# Patient Record
Sex: Female | Born: 1935 | Race: White | Hispanic: No | Smoking: Former smoker
Health system: Southern US, Community
[De-identification: ages and names within clinical notes are randomized; demographics above are authoritative.]

## PROBLEM LIST (undated history)

## (undated) DIAGNOSIS — D500 Iron deficiency anemia secondary to blood loss (chronic): Secondary | ICD-10-CM

## (undated) DIAGNOSIS — G459 Transient cerebral ischemic attack, unspecified: Secondary | ICD-10-CM

## (undated) DIAGNOSIS — Z9289 Personal history of other medical treatment: Secondary | ICD-10-CM

## (undated) DIAGNOSIS — I340 Nonrheumatic mitral (valve) insufficiency: Secondary | ICD-10-CM

## (undated) DIAGNOSIS — R001 Bradycardia, unspecified: Secondary | ICD-10-CM

## (undated) DIAGNOSIS — I2510 Atherosclerotic heart disease of native coronary artery without angina pectoris: Secondary | ICD-10-CM

## (undated) DIAGNOSIS — K219 Gastro-esophageal reflux disease without esophagitis: Secondary | ICD-10-CM

## (undated) DIAGNOSIS — C8590 Non-Hodgkin lymphoma, unspecified, unspecified site: Secondary | ICD-10-CM

## (undated) DIAGNOSIS — I5032 Chronic diastolic (congestive) heart failure: Secondary | ICD-10-CM

## (undated) DIAGNOSIS — I219 Acute myocardial infarction, unspecified: Secondary | ICD-10-CM

## (undated) HISTORY — DX: Acute myocardial infarction, unspecified: I21.9

## (undated) HISTORY — DX: Transient cerebral ischemic attack, unspecified: G45.9

## (undated) HISTORY — DX: Nonrheumatic mitral (valve) insufficiency: I34.0

## (undated) HISTORY — DX: Atherosclerotic heart disease of native coronary artery without angina pectoris: I25.10

## (undated) HISTORY — PX: EYE SURGERY: SHX253

## (undated) HISTORY — DX: Iron deficiency anemia secondary to blood loss (chronic): D50.0

## (undated) HISTORY — DX: Chronic diastolic (congestive) heart failure: I50.32

## (undated) HISTORY — DX: Bradycardia, unspecified: R00.1

## (undated) HISTORY — PX: OTHER SURGICAL HISTORY: SHX169

## (undated) HISTORY — DX: Non-Hodgkin lymphoma, unspecified, unspecified site: C85.90

---

## 1971-04-26 HISTORY — PX: CHOLECYSTECTOMY: SHX55

## 2007-04-26 DIAGNOSIS — I219 Acute myocardial infarction, unspecified: Secondary | ICD-10-CM

## 2007-04-26 DIAGNOSIS — Z9289 Personal history of other medical treatment: Secondary | ICD-10-CM

## 2007-04-26 HISTORY — DX: Personal history of other medical treatment: Z92.89

## 2007-04-26 HISTORY — DX: Acute myocardial infarction, unspecified: I21.9

## 2007-04-26 HISTORY — PX: CORONARY ANGIOPLASTY WITH STENT PLACEMENT: SHX49

## 2007-04-26 HISTORY — PX: CARDIAC SURGERY: SHX584

## 2007-11-24 HISTORY — PX: CORONARY ARTERY BYPASS GRAFT: SHX141

## 2011-04-26 HISTORY — PX: ESOPHAGOGASTRODUODENOSCOPY: SHX1529

## 2012-04-25 HISTORY — PX: COLON SURGERY: SHX602

## 2013-07-22 ENCOUNTER — Encounter (INDEPENDENT_AMBULATORY_CARE_PROVIDER_SITE_OTHER): Payer: Self-pay

## 2013-07-24 ENCOUNTER — Encounter (INDEPENDENT_AMBULATORY_CARE_PROVIDER_SITE_OTHER): Payer: Self-pay | Admitting: General Surgery

## 2013-07-24 ENCOUNTER — Ambulatory Visit (INDEPENDENT_AMBULATORY_CARE_PROVIDER_SITE_OTHER): Payer: Medicare Other | Admitting: General Surgery

## 2013-07-24 ENCOUNTER — Other Ambulatory Visit (INDEPENDENT_AMBULATORY_CARE_PROVIDER_SITE_OTHER): Payer: Self-pay | Admitting: General Surgery

## 2013-07-24 VITALS — BP 128/78 | HR 74 | Temp 98.0°F | Resp 16 | Ht 61.0 in | Wt 180.4 lb

## 2013-07-24 DIAGNOSIS — K9403 Colostomy malfunction: Secondary | ICD-10-CM | POA: Insufficient documentation

## 2013-07-24 DIAGNOSIS — K9413 Enterostomy malfunction: Secondary | ICD-10-CM

## 2013-07-24 DIAGNOSIS — K5900 Constipation, unspecified: Secondary | ICD-10-CM

## 2013-07-24 NOTE — Progress Notes (Signed)
Chief Complaint  Patient presents with  . New Evaluation    eval for ostomy revision    HISTORY:  Makayla Fisher is a 80 y.o. female who presents to the office with colostomy dysfunction.  She has a history of a sigmoid stricture which calls colonic obstruction in the fall of last year. She underwent urgent surgery on 02/26/2013 for concern of ischemic or perforated colon. 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 She underwent an open right colectomy and sigmoid colectomy with ileotransverse anastomosis and end descending colostomy with Hartmann's pouch.  She had what sounds like a very long protracted post operative course requiring mechanical ventilation.  It also appears that she went into acute renal failure. She was discharged to rehabilitation to return to the hospital a day later for what sounds like either a postoperative small bowel obstruction or possibly a contained leak for which she was given antibiotics.  She has slowly recovered.  She is now moved to [place] and is at an assisted living facility in town. Over the past few months he has had difficulty with ostomy pouching. She has had trouble with skin breakdown and stool leakage.    Past Medical History  Diagnosis Date  . Heart attack        Past Surgical History  Procedure Laterality Date  . Colon surgery  2014    Colostomy  . Cardiac surgery  2009  . Coronary angioplasty with stent placement  2009  . C section  1947, 1963  . Cholecystectomy  1973  . Esophagogastroduodenoscopy  2013      Current Outpatient Prescriptions  Medication Sig Dispense Refill  . acetaminophen (TYLENOL) 325 MG tablet Take 325 mg by mouth. One tablet as needed      . ascorbic acid (VITAMIN C) 1000 MG tablet Take 1,000 mg by mouth daily.      Marland Kitchen aspirin 81 MG tablet Take 81 mg by mouth daily.      . calcium carbonate (TUMS - DOSED IN MG ELEMENTAL CALCIUM) 500 MG chewable tablet Chew 1 tablet by  mouth daily.      . carboxymethylcellulose (REFRESH PLUS) 0.5 % SOLN 1 drop 2 (two) times daily as needed.      . chlorhexidine (PERIDEX) 0.12 % solution Use as directed 15 mLs in the mouth or throat 2 (two) times daily.      . cholecalciferol (VITAMIN D) 1000 UNITS tablet Take 1,000 Units by mouth daily.      . CloNIDine HCl (CATAPRES PO) Take by mouth at bedtime as needed (1 tablet).      . cycloSPORINE (RESTASIS) 0.05 % ophthalmic emulsion 1 drop 2 (two) times daily.      . fondaparinux (ARIXTRA) 2.5 MG/0.5ML SOLN injection Inject 2.5 mg into the skin daily.      . furosemide (LASIX) 20 MG tablet Take 20 mg by mouth.      . Levothyroxine Sodium (SYNTHROID PO) Take by mouth daily.      Marland Kitchen LISINOPRIL PO Take 5 mg by mouth.      . magnesium hydroxide (MILK OF MAGNESIA) 400 MG/5ML suspension Take by mouth daily as needed for mild constipation.      . MORPHINE SULFATE PO Take 2 mg by mouth.      . Omega-3 Fatty Acids (FISH OIL) 1000 MG CAPS Take 1,000 mg by mouth daily.      . ondansetron (ZOFRAN) 8 MG tablet Take by mouth every 8 (eight) hours  as needed for nausea or vomiting.      . pravastatin (PRAVACHOL) 40 MG tablet Take 40 mg by mouth at bedtime.      Marland Kitchen rOPINIRole (REQUIP) 1 MG tablet Take 1 mg by mouth at bedtime.      . sertraline (ZOLOFT) 25 MG tablet Take 25 mg by mouth daily.      . Simvastatin (ZOCOR PO) Take by mouth.      . Thiamine Mononitrate (VITAMIN B1 PO) Take by mouth. Take 1 with meal daily      . timolol (TIMOPTIC) 0.5 % ophthalmic solution 1 drop 2 (two) times daily. In affected eye      . Tiotropium Bromide Monohydrate (SPIRIVA RESPIMAT) 2.5 MCG/ACT AERS Inhale 1 puff into the lungs daily.      . Bisacodyl (DULCOLAX PO) Take by mouth as needed.      . ezetimibe (ZETIA) 10 MG tablet Take 10 mg by mouth at bedtime.      . levalbuterol (XOPENEX) 1.25 MG/3ML nebulizer solution Take 1.25 mg by nebulization every 6 (six) hours as needed for wheezing.      Marland Kitchen LORazepam (ATIVAN) 1  MG tablet Take 1 mg by mouth as needed for anxiety.      . Multiple Vitamins-Minerals (CENTRUM SILVER) CHEW Chew by mouth daily.      . pantoprazole (PROTONIX) 40 MG tablet Take 40 mg by mouth daily.       No current facility-administered medications for this visit.     Allergies  Allergen Reactions  . Epipen [Epinephrine Hcl]     Heart palpitations  . Heparin     Unsure of side affects  . Levaquin [Levofloxacin In D5w]     Weakness  . Sulfa Antibiotics Rash      Family History  Problem Relation Age of Onset  . Diverticulitis Mother   . Heart attack Mother   . Ulcers Father       History   Social History  . Marital Status: Married    Spouse Name: N/A    Number of Children: N/A  . Years of Education: N/A   Social History Main Topics  . Smoking status: Former Research scientist (life sciences)  . Smokeless tobacco: None  . Alcohol Use: No  . Drug Use: No  . Sexual Activity: None   Other Topics Concern  . None   Social History Narrative  . None       REVIEW OF SYSTEMS - PERTINENT POSITIVES ONLY: Review of Systems - General ROS: negative for - chills or fever Respiratory ROS: no cough, shortness of breath, or wheezing Cardiovascular ROS: no chest pain or dyspnea on exertion Gastrointestinal ROS: no abdominal pain, change in bowel habits, or black or bloody stools Genito-Urinary ROS: no dysuria, trouble voiding, or hematuria  EXAM: Filed Vitals:   07/24/13 1335  BP: 128/78  Pulse: 74  Temp: 98 F (36.7 C)  Resp: 16    General appearance: alert and cooperative Resp: clear to auscultation bilaterally Cardio: regular rate and rhythm GI: normal findings: soft, non-tender ostomy retracted with stenotic skin opening approximately 1 x 2 cm Mild skin breakdown at edges of the ostomy.   ASSESSMENT AND PLAN: Makayla Fisher Is an 80 year old female who underwent a large colonic resection after a contained perforation due to diverticular stricture. She is slowly recovering. She does  have a retracted ostomy with some stenosis present at the skin level. There is also some irritated skin around her ostomy site. I have recommended  that she start some MiraLax daily to help with her bowel movements and prevent constipation. I would also like to get a CT scan to evaluate the length of colon present in her abdomen. I will then see her back in the office in approximately 8 weeks for reevaluation. I anticipate that she will need some sort of procedure operatively to help with her stenosis and a severe traction. I think the longer that we wait the better chance  we have of doing something locally and not a large abdominal procedure.  She will continue to work with the ostomy nurse to find a pouching system that works best for her.     Rosario Adie, MD Colon and Rectal Surgery / [facility]      Visit Diagnoses: 1. Colostomy stenosis   2. Unspecified constipation     Primary Care Physician: No primary provider on file.

## 2013-07-24 NOTE — Patient Instructions (Signed)
Add 1 dose of MiraLax to your daily medications.  Continue to work with the ostomy RN to find the right wafer to decrease skin breakdown.  Return to the office in 8 wks.

## 2013-08-12 ENCOUNTER — Encounter (INDEPENDENT_AMBULATORY_CARE_PROVIDER_SITE_OTHER): Payer: Self-pay

## 2013-08-14 ENCOUNTER — Emergency Department (HOSPITAL_COMMUNITY): Payer: Medicare Other

## 2013-08-14 ENCOUNTER — Encounter (HOSPITAL_COMMUNITY): Payer: Self-pay | Admitting: Emergency Medicine

## 2013-08-14 ENCOUNTER — Emergency Department (HOSPITAL_COMMUNITY)
Admission: EM | Admit: 2013-08-14 | Discharge: 2013-08-14 | Disposition: A | Discharge status: Home / Self Care | Payer: Medicare Other | Attending: Emergency Medicine | Admitting: Emergency Medicine

## 2013-08-14 DIAGNOSIS — Z9861 Coronary angioplasty status: Secondary | ICD-10-CM | POA: Insufficient documentation

## 2013-08-14 DIAGNOSIS — S0003XA Contusion of scalp, initial encounter: Secondary | ICD-10-CM | POA: Insufficient documentation

## 2013-08-14 DIAGNOSIS — Z9889 Other specified postprocedural states: Secondary | ICD-10-CM | POA: Insufficient documentation

## 2013-08-14 DIAGNOSIS — S1093XA Contusion of unspecified part of neck, initial encounter: Principal | ICD-10-CM

## 2013-08-14 DIAGNOSIS — S8000XA Contusion of unspecified knee, initial encounter: Secondary | ICD-10-CM | POA: Insufficient documentation

## 2013-08-14 DIAGNOSIS — Y9389 Activity, other specified: Secondary | ICD-10-CM | POA: Insufficient documentation

## 2013-08-14 DIAGNOSIS — Z79899 Other long term (current) drug therapy: Secondary | ICD-10-CM | POA: Insufficient documentation

## 2013-08-14 DIAGNOSIS — Z8679 Personal history of other diseases of the circulatory system: Secondary | ICD-10-CM | POA: Insufficient documentation

## 2013-08-14 DIAGNOSIS — Z7982 Long term (current) use of aspirin: Secondary | ICD-10-CM | POA: Insufficient documentation

## 2013-08-14 DIAGNOSIS — S0083XA Contusion of other part of head, initial encounter: Secondary | ICD-10-CM | POA: Insufficient documentation

## 2013-08-14 DIAGNOSIS — R296 Repeated falls: Secondary | ICD-10-CM | POA: Insufficient documentation

## 2013-08-14 DIAGNOSIS — S40029A Contusion of unspecified upper arm, initial encounter: Secondary | ICD-10-CM | POA: Insufficient documentation

## 2013-08-14 DIAGNOSIS — S40022A Contusion of left upper arm, initial encounter: Secondary | ICD-10-CM

## 2013-08-14 DIAGNOSIS — Y929 Unspecified place or not applicable: Secondary | ICD-10-CM | POA: Insufficient documentation

## 2013-08-14 DIAGNOSIS — IMO0002 Reserved for concepts with insufficient information to code with codable children: Secondary | ICD-10-CM | POA: Insufficient documentation

## 2013-08-14 DIAGNOSIS — Z87891 Personal history of nicotine dependence: Secondary | ICD-10-CM | POA: Insufficient documentation

## 2013-08-14 LAB — ACETAMINOPHEN LEVEL: Acetaminophen (Tylenol), Serum: <15 ug/mL (ref 10–30)

## 2013-08-14 MED ORDER — ACETAMINOPHEN 325 MG PO TABS
650.0000 mg | ORAL_TABLET | Freq: Once | ORAL | Status: AC
  Administered 2013-08-14: 650 mg via ORAL
  Filled 2013-08-14: qty 2

## 2013-08-14 MED ORDER — TRAMADOL HCL 50 MG PO TABS
50.0000 mg | ORAL_TABLET | Freq: Four times a day (QID) | ORAL | Status: DC | PRN
Start: 2013-08-14 — End: 2013-10-23

## 2013-08-14 MED ORDER — OXYCODONE-ACETAMINOPHEN 5-325 MG PO TABS
2.0000 | ORAL_TABLET | Freq: Once | ORAL | Status: DC
Start: 2013-08-14 — End: 2013-08-15
  Filled 2013-08-14: qty 2

## 2013-08-14 NOTE — ED Notes (Addendum)
Pt has hematoma on L side of forehead, abrasions on L knee, and L arm/shoulder pain upon ROM. Pt denies LOC when falling.

## 2013-08-14 NOTE — ED Notes (Signed)
Per EMS, Pt c/o L shoulder pain after fall. Pt coming from [facility]. Pt aws trying to scoot sideways caught sneaker on rug and fell on L side. Pt c/o L shoulder pain only on ROM. Pt also has hematoma on L side of head. Denies pain in head. Pt A&Ox4.

## 2013-08-14 NOTE — ED Notes (Signed)
Bed: QM25 Expected date: 08/14/13 Expected time: 7:30 PM Means of arrival: Ambulance Comments: Fall, shoulder pain

## 2013-08-14 NOTE — ED Notes (Signed)
Ortho Tech removed rings on L hand to put on sling, jewelry given to niece.

## 2013-08-14 NOTE — Discharge Instructions (Signed)

## 2013-08-19 ENCOUNTER — Encounter (HOSPITAL_COMMUNITY): Payer: Self-pay | Admitting: Emergency Medicine

## 2013-08-19 ENCOUNTER — Emergency Department (HOSPITAL_COMMUNITY)
Admission: EM | Admit: 2013-08-19 | Discharge: 2013-08-19 | Disposition: A | Discharge status: Skilled Nursing Facility | Payer: Medicare Other | Attending: Emergency Medicine | Admitting: Emergency Medicine

## 2013-08-19 ENCOUNTER — Emergency Department (HOSPITAL_COMMUNITY): Payer: Medicare Other

## 2013-08-19 DIAGNOSIS — J90 Pleural effusion, not elsewhere classified: Secondary | ICD-10-CM | POA: Insufficient documentation

## 2013-08-19 DIAGNOSIS — R109 Unspecified abdominal pain: Secondary | ICD-10-CM

## 2013-08-19 DIAGNOSIS — Z87891 Personal history of nicotine dependence: Secondary | ICD-10-CM | POA: Insufficient documentation

## 2013-08-19 DIAGNOSIS — Z888 Allergy status to other drugs, medicaments and biological substances status: Secondary | ICD-10-CM | POA: Insufficient documentation

## 2013-08-19 DIAGNOSIS — S8000XA Contusion of unspecified knee, initial encounter: Secondary | ICD-10-CM | POA: Insufficient documentation

## 2013-08-19 DIAGNOSIS — S0003XA Contusion of scalp, initial encounter: Secondary | ICD-10-CM | POA: Insufficient documentation

## 2013-08-19 DIAGNOSIS — S1093XA Contusion of unspecified part of neck, initial encounter: Secondary | ICD-10-CM

## 2013-08-19 DIAGNOSIS — Z79899 Other long term (current) drug therapy: Secondary | ICD-10-CM | POA: Insufficient documentation

## 2013-08-19 DIAGNOSIS — I252 Old myocardial infarction: Secondary | ICD-10-CM | POA: Insufficient documentation

## 2013-08-19 DIAGNOSIS — M549 Dorsalgia, unspecified: Secondary | ICD-10-CM | POA: Insufficient documentation

## 2013-08-19 DIAGNOSIS — Z882 Allergy status to sulfonamides status: Secondary | ICD-10-CM | POA: Insufficient documentation

## 2013-08-19 DIAGNOSIS — S0083XA Contusion of other part of head, initial encounter: Secondary | ICD-10-CM | POA: Insufficient documentation

## 2013-08-19 DIAGNOSIS — T07XXXA Unspecified multiple injuries, initial encounter: Secondary | ICD-10-CM

## 2013-08-19 DIAGNOSIS — R911 Solitary pulmonary nodule: Secondary | ICD-10-CM | POA: Insufficient documentation

## 2013-08-19 DIAGNOSIS — Y929 Unspecified place or not applicable: Secondary | ICD-10-CM | POA: Insufficient documentation

## 2013-08-19 DIAGNOSIS — W19XXXA Unspecified fall, initial encounter: Secondary | ICD-10-CM | POA: Insufficient documentation

## 2013-08-19 DIAGNOSIS — Y939 Activity, unspecified: Secondary | ICD-10-CM | POA: Insufficient documentation

## 2013-08-19 DIAGNOSIS — Z7982 Long term (current) use of aspirin: Secondary | ICD-10-CM | POA: Insufficient documentation

## 2013-08-19 DIAGNOSIS — M25562 Pain in left knee: Secondary | ICD-10-CM

## 2013-08-19 DIAGNOSIS — S301XXA Contusion of abdominal wall, initial encounter: Secondary | ICD-10-CM | POA: Insufficient documentation

## 2013-08-19 DIAGNOSIS — Z91041 Radiographic dye allergy status: Secondary | ICD-10-CM | POA: Insufficient documentation

## 2013-08-19 LAB — COMPREHENSIVE METABOLIC PANEL
ALBUMIN: 3.1 g/dL — AB (ref 3.5–5.2)
ALT: 17 U/L (ref 0–35)
AST: 56 U/L — ABNORMAL HIGH (ref 0–37)
Alkaline Phosphatase: 146 U/L — ABNORMAL HIGH (ref 39–117)
BUN: 6 mg/dL (ref 6–23)
CO2: 26 mEq/L (ref 19–32)
Calcium: 9 mg/dL (ref 8.4–10.5)
Chloride: 98 mEq/L (ref 96–112)
Creatinine, Ser: 0.46 mg/dL — ABNORMAL LOW (ref 0.50–1.10)
GFR calc Af Amer: >90 mL/min (ref >90)
GFR calc non Af Amer: 86 mL/min — ABNORMAL LOW (ref >90)
Glucose, Bld: 88 mg/dL (ref 70–99)
Potassium: 3.7 mEq/L (ref 3.7–5.3)
SODIUM: 137 meq/L (ref 137–147)
TOTAL PROTEIN: 6.6 g/dL (ref 6.0–8.3)
Total Bilirubin: 0.7 mg/dL (ref 0.3–1.2)

## 2013-08-19 LAB — URINALYSIS, ROUTINE W REFLEX MICROSCOPIC
Bilirubin Urine: NEGATIVE
Glucose, UA: NEGATIVE mg/dL
Hgb urine dipstick: NEGATIVE
Ketones, ur: NEGATIVE mg/dL
Nitrite: NEGATIVE
PH: 7 (ref 5.0–8.0)
Protein, ur: NEGATIVE mg/dL
Specific Gravity, Urine: 1.007 (ref 1.005–1.030)
Urobilinogen, UA: 0.2 mg/dL (ref 0.0–1.0)

## 2013-08-19 LAB — CBC WITH DIFFERENTIAL/PLATELET
BASOS ABS: 0 10*3/uL (ref 0.0–0.1)
BASOS PCT: 0 % (ref 0–1)
Eosinophils Absolute: 0.2 10*3/uL (ref 0.0–0.7)
Eosinophils Relative: 3 % (ref 0–5)
HCT: 33 % — ABNORMAL LOW (ref 36.0–46.0)
Hemoglobin: 11 g/dL — ABNORMAL LOW (ref 12.0–15.0)
LYMPHS PCT: 28 % (ref 12–46)
Lymphs Abs: 2 10*3/uL (ref 0.7–4.0)
MCH: 28 pg (ref 26.0–34.0)
MCHC: 33.3 g/dL (ref 30.0–36.0)
MCV: 84 fL (ref 78.0–100.0)
Monocytes Absolute: 0.8 10*3/uL (ref 0.1–1.0)
Monocytes Relative: 10 % (ref 3–12)
Neutro Abs: 4.4 10*3/uL (ref 1.7–7.7)
Neutrophils Relative %: 59 % (ref 43–77)
PLATELETS: 221 10*3/uL (ref 150–400)
RBC: 3.93 MIL/uL (ref 3.87–5.11)
RDW: 17.8 % — AB (ref 11.5–15.5)
WBC: 7.4 10*3/uL (ref 4.0–10.5)

## 2013-08-19 LAB — URINE MICROSCOPIC-ADD ON

## 2013-08-19 LAB — I-STAT TROPONIN, ED: TROPONIN I, POC: 0.03 ng/mL (ref 0.00–0.08)

## 2013-08-19 LAB — LIPASE, BLOOD: Lipase: 18 U/L (ref 11–59)

## 2013-08-19 MED ORDER — IOHEXOL 300 MG/ML  SOLN
100.0000 mL | Freq: Once | INTRAMUSCULAR | Status: AC | PRN
  Administered 2013-08-19: 100 mL via INTRAVENOUS

## 2013-08-19 MED ORDER — SODIUM CHLORIDE 0.9 % IV BOLUS (SEPSIS)
1000.0000 mL | Freq: Once | INTRAVENOUS | Status: AC
  Administered 2013-08-19: 1000 mL via INTRAVENOUS

## 2013-08-19 MED ORDER — ACETAMINOPHEN 500 MG PO TABS: 1000.0000 mg | ORAL_TABLET | Freq: Four times a day (QID) | ORAL | Status: DC | PRN

## 2013-08-19 MED ORDER — ACETAMINOPHEN 500 MG PO TABS
1000.0000 mg | ORAL_TABLET | Freq: Once | ORAL | Status: AC
  Administered 2013-08-19: 1000 mg via ORAL
  Filled 2013-08-19: qty 2

## 2013-08-19 MED ORDER — DOCUSATE SODIUM 100 MG PO CAPS: 100.0000 mg | ORAL_CAPSULE | Freq: Two times a day (BID) | ORAL | Status: DC

## 2013-08-19 MED ORDER — POLYETHYLENE GLYCOL 3350 17 G PO PACK: 17.0000 g | PACK | Freq: Every day | ORAL | Status: DC

## 2013-08-19 NOTE — ED Notes (Signed)
Patient transported to CT 

## 2013-08-19 NOTE — ED Notes (Signed)
[facility] called for patient transport 

## 2013-08-19 NOTE — ED Provider Notes (Signed)
TIME SEEN: 3:17 PM  CHIEF COMPLAINT: Abdominal pain  HPI: Patient is a 80 year old female who lives at [facility] who had a fall on 4/22 who is seen in the emergency department had a negative x-ray of her left shoulder who presents today with pain diffusely around her abdomen and into her back this been present since her fall. She states it hurts to breathe but she does not feel short of breath. She denies any chest pain. She didn't hit her head but did not have any imaging of her head. She is on anticoagulation. She also has bruising to her left knee and is requesting an x-ray today. No fevers or cough. No hypoxia or respiratory distress.  ROS: See HPI Constitutional: no fever  Eyes: no drainage  ENT: no runny nose   Cardiovascular:  no chest pain  Resp: no SOB  GI: no vomiting GU: no dysuria Integumentary: no rash  Allergy: no hives  Musculoskeletal: no leg swelling  Neurological: no slurred speech ROS otherwise negative  PAST MEDICAL HISTORY/PAST SURGICAL HISTORY:  Past Medical History  Diagnosis Date  . Heart attack     MEDICATIONS:  Prior to Admission medications   Medication Sig Start Date End Date Taking? Authorizing Provider  amLODipine (NORVASC) 5 MG tablet Take 5 mg by mouth daily.   Yes Historical Provider, MD  aspirin 81 MG tablet Take 81 mg by mouth every other day.    Yes Historical Provider, MD  carvedilol (COREG) 3.125 MG tablet Take 3.125 mg by mouth 2 (two) times daily with a meal.   Yes Historical Provider, MD  cycloSPORINE (RESTASIS) 0.05 % ophthalmic emulsion 1 drop 2 (two) times daily.   Yes Historical Provider, MD  ezetimibe (ZETIA) 10 MG tablet Take 10 mg by mouth daily.    Yes Historical Provider, MD  furosemide (LASIX) 20 MG tablet Take 20 mg by mouth.   Yes Historical Provider, MD  levothyroxine (SYNTHROID, LEVOTHROID) 88 MCG tablet Take 88 mcg by mouth daily before breakfast.   Yes Historical Provider, MD  lisinopril (PRINIVIL,ZESTRIL) 20 MG tablet  Take 20 mg by mouth 2 (two) times daily.   Yes Historical Provider, MD  Multiple Vitamins-Minerals (CENTRUM SILVER) CHEW Chew by mouth daily.   Yes Historical Provider, MD  pantoprazole (PROTONIX) 40 MG tablet Take 40 mg by mouth daily.   Yes Historical Provider, MD  rOPINIRole (REQUIP) 1 MG tablet Take 1 mg by mouth at bedtime.   Yes Historical Provider, MD  sertraline (ZOLOFT) 25 MG tablet Take 25 mg by mouth daily.   Yes Historical Provider, MD  simvastatin (ZOCOR) 40 MG tablet Take 40 mg by mouth daily.   Yes Historical Provider, MD  Tiotropium Bromide Monohydrate (SPIRIVA RESPIMAT) 2.5 MCG/ACT AERS Inhale 1 puff into the lungs daily.   Yes Historical Provider, MD  traMADol (ULTRAM) 50 MG tablet Take 1 tablet (50 mg total) by mouth every 6 (six) hours as needed. 08/14/13  Yes Virgel Manifold, MD    ALLERGIES:  Allergies  Allergen Reactions  . Epipen [Epinephrine Hcl]     Heart palpitations  . Heparin     Unsure of side affects  . Levaquin [Levofloxacin In D5w]     Weakness  . Sulfa Antibiotics Rash    SOCIAL HISTORY:  History  Substance Use Topics  . Smoking status: Former Research scientist (life sciences)  . Smokeless tobacco: Not on file  . Alcohol Use: No    FAMILY HISTORY: Family History  Problem Relation Age of Onset  .  Diverticulitis Mother   . Heart attack Mother   . Ulcers Father     EXAM: BP 144/49  Pulse 65  Temp(Src) 98 F (36.7 C) (Oral)  Resp 16  SpO2 95% CONSTITUTIONAL: Alert and oriented and responds appropriately to questions. Well-appearing; well-nourished; GCS 15 HEAD: Normocephalic; areas of ecchymosis to the face that are healing EYES: Conjunctivae clear, PERRL, EOMI ENT: normal nose; no rhinorrhea; moist mucous membranes; pharynx without lesions noted; no dental injury; ; no septal hematoma NECK: Supple, no meningismus, no LAD; no midline spinal tenderness, step-off or deformity CARD: RRR; S1 and S2 appreciated; no murmurs, no clicks, no rubs, no gallops RESP: Normal  chest excursion without splinting or tachypnea; breath sounds clear and equal bilaterally; no wheezes, no rhonchi, no rales; chest wall stable, no crepitus or ecchymosis the patient does have tenderness over her lower ribs into her back ABD/GI: Normal bowel sounds; non-distended; soft, no rebound, no guarding, mildly tender to palpation diffusely across her lower ribs and into her back without peritoneal signs PELVIS:  stable, nontender to palpation BACK:  The back appears normal and is non-tender to palpation, there is no CVA tenderness; no midline spinal tenderness, step-off or deformity EXT: Patient has ecchymosis over her left anterior knee with small effusion, no signs of septic arthritis, no ligamentous laxity, 2+ DP pulses bilaterally, otherwise Normal ROM in all joints; non-tender to palpation; no edema; normal capillary refill; no cyanosis    SKIN: Normal color for age and race; warm NEURO: Moves all extremities equally, cranial nerves II through XII intact, sensation to light touch intact diffusely PSYCH: The patient's mood and manner are appropriate. Grooming and personal hygiene are appropriate.  MEDICAL DECISION MAKING: Patient here with pain after a fall. We'll obtain CT imaging of her head, cervical spine, chest, abdomen and pelvis given his very difficult to localize patient's pain and she is tender in her chest wall and her abdomen and did have a head injury with no CT imaging additionally. We'll also obtain imaging of her left knee as there is ecchymosis and pain. Patient also has risk factors for ACS, will obtain EKG and troponin. We'll give pain medication and reassess.  ED PROGRESS: Patient's labs are unremarkable complete and negative troponin. Do not feel she needs serial set of enzymes given she has had constant pain for 5 days. Her CT imaging shows bilateral small pleural effusions and pulmonary nodules for which she will need outpatient followup. No other acute traumatic injury  on her exam. She is not having respiratory distress and is not hypoxic and hemodynamically stable. Suspect her pain is do to muscle strain, contusions have discussed supportive care instructions and return precautions. Patient and her family at bedside verbalize understanding and are comfortable with this plan.     EKG Interpretation  Date/Time:  Monday August 19 2013 13:41:17 EDT Ventricular Rate:  71 PR Interval:  170 QRS Duration: 96 QT Interval:  414 QTC Calculation: 450 R Axis:   32 Text Interpretation:  Sinus rhythm Probable left atrial enlargement Low voltage, extremity and precordial leads No old tracing to compare Confirmed by WARD,  DO, KRISTEN [phone]) on 08/19/2013 3:42:05 PM        Emanuel, DO 08/20/13 [id number]

## 2013-08-19 NOTE — Progress Notes (Signed)
CSW met with the patient and niece at bedside.  Patient reports being hard of hearing but able and willing to answer all questions.  Patient is alert oriented x4, calm, and cooperative.  Patient reports a fall a couple days ago that she is still experiencing some complications.  Patient reports her intent is to return to [facility] once she is medically cleared.  Patient reports her son in law is her POA and he is aware of this ED admission.    Chesley Noon, MSW, [address], 08/19/2013 Evening Clinical Social Worker [phone]

## 2013-08-19 NOTE — ED Notes (Addendum)
Per EMS patient from [facility] pt was seen at ED for fall last Wednesday, seen here for xray and sent home with Tramadol, starting Saturday had flank pain, back pain, confusion, SOB when sitting up. Pt bruised on flank, temple, arms. Per EMS patient's mental baseline is A & O x 4.  Patient denies confusion, is A & O x 4, states that main complaint is epigastric pain on sitting up, radiating around to back. Pt states she has not had bowel movement x 3 days, colostomy bag in place.

## 2013-08-19 NOTE — ED Notes (Signed)
Bed: EX93 Expected date:  Expected time:  Means of arrival:  Comments: ems- elderly,arm pain

## 2013-08-19 NOTE — Discharge Instructions (Signed)
Contusion A contusion is a deep bruise. Contusions are the result of an injury that caused bleeding under the skin. The contusion may turn blue, purple, or yellow. Minor injuries will give you a painless contusion, but more severe contusions may stay painful and swollen for a few weeks.  CAUSES  A contusion is usually caused by a blow, trauma, or direct force to an area of the body. SYMPTOMS   Swelling and redness of the injured area.  Bruising of the injured area.  Tenderness and soreness of the injured area.  Pain. DIAGNOSIS  The diagnosis can be made by taking a history and physical exam. An X-ray, CT scan, or MRI may be needed to determine if there were any associated injuries, such as fractures. TREATMENT  Specific treatment will depend on what area of the body was injured. In general, the best treatment for a contusion is resting, icing, elevating, and applying cold compresses to the injured area. Over-the-counter medicines may also be recommended for pain control. Ask your caregiver what the best treatment is for your contusion. HOME CARE INSTRUCTIONS   Put ice on the injured area.  Put ice in a plastic bag.  Place a towel between your skin and the bag.  Leave the ice on for 15-20 minutes, 03-04 times a day.  Only take over-the-counter or prescription medicines for pain, discomfort, or fever as directed by your caregiver. Your caregiver may recommend avoiding anti-inflammatory medicines (aspirin, ibuprofen, and naproxen) for 48 hours because these medicines may increase bruising.  Rest the injured area.  If possible, elevate the injured area to reduce swelling. SEEK IMMEDIATE MEDICAL CARE IF:   You have increased bruising or swelling.  You have pain that is getting worse.  Your swelling or pain is not relieved with medicines. MAKE SURE YOU:   Understand these instructions.  Will watch your condition.  Will get help right away if you are not doing well or get  worse. Document Released: 01/19/2005 Document Revised: 07/04/2011 Document Reviewed: 02/14/2011 [facility] Patient Information 2014 [place], [place]. Fiber Content in Foods Drinking plenty of fluids and consuming foods high in fiber can help with constipation. See the list below for the fiber content of some common foods. Starches and Grains / Dietary Fiber (g)  Cheerios, 1 cup / 3 g  Kellogg's Corn Flakes, 1 cup / 0.7 g  Rice Krispies, 1  cup / 0.3 g  Quaker Oat Life Cereal,  cup / 2.1 g  Oatmeal, instant (cooked),  cup / 2 g  Kellogg's Frosted Mini Wheats, 1 cup / 5.1 g  Rice, brown, long-grain (cooked), 1 cup / 3.5 g  Rice, white, long-grain (cooked), 1 cup / 0.6 g  Macaroni, cooked, enriched, 1 cup / 2.5 g Legumes / Dietary Fiber (g)  Beans, baked, canned, plain or vegetarian,  cup / 5.2 g  Beans, kidney, canned,  cup / 6.8 g  Beans, pinto, dried (cooked),  cup / 7.7 g  Beans, pinto, canned,  cup / 5.5 g Breads and Crackers / Dietary Fiber (g)  Graham crackers, plain or honey, 2 squares / 0.7 g  Saltine crackers, 3 squares / 0.3 g  Pretzels, plain, salted, 10 pieces / 1.8 g  Bread, whole-wheat, 1 slice / 1.9 g  Bread, white, 1 slice / 0.7 g  Bread, raisin, 1 slice / 1.2 g  Bagel, plain, 3 oz / 2 g  Tortilla, flour, 1 oz / 0.9 g  Tortilla, corn, 1 small / 1.5 g  Bun, hamburger or hotdog, 1 small / 0.9 g Fruits / Dietary Fiber (g)  Apple, raw with skin, 1 medium / 4.4 g  Applesauce, sweetened,  cup / 1.5 g  Banana,  medium / 1.5 g  Grapes, 10 grapes / 0.4 g  Orange, 1 small / 2.3 g  Raisin, 1.5 oz / 1.6 g  Melon, 1 cup / 1.4 g Vegetables / Dietary Fiber (g)  Green beans, canned,  cup / 1.3 g  Carrots (cooked),  cup / 2.3 g  Broccoli (cooked),  cup / 2.8 g  Peas, frozen (cooked),  cup / 4.4 g  Potatoes, mashed,  cup / 1.6 g  Lettuce, 1 cup / 0.5 g  Corn, canned,  cup / 1.6 g  Tomato,  cup / 1.1 g Document Released:  08/28/2006 Document Revised: 07/04/2011 Document Reviewed: 10/23/2006 [employer] Patient Information 2014 [place], [place].    You were seen in the emergency department for abdominal pain and back pain after a fall 5 days ago. Your CT scan showed no acute injury. You may continue taking her tramadol as needed for pain. I also recommend that you take MiraLAX and Colace to prevent constipation and hopefully bowel movements while taking tramadol. Please followup with your primary care physician as needed.

## 2013-08-19 NOTE — ED Notes (Signed)
Pt given sandwich and drink.

## 2013-08-19 NOTE — Progress Notes (Signed)
  CARE MANAGEMENT ED NOTE 08/19/2013  Patient:  AMBERA, FEDELE   Account Number:  [id number]  Date Initiated:  08/19/2013  Documentation initiated by:  Jackelyn Poling  Subjective/Objective Assessment:   80 yr old medicare/tricare for life coming from [facility] c/o bruised on flank, temple, arms.     Subjective/Objective Assessment Detail:   Female family at bedside  pcp [name]     Action/Plan:   CM spoke with pt updated EPIC   Action/Plan Detail:   Anticipated DC Date:       Status Recommendation to Physician:   Result of Recommendation:    Other ED Services  Consult Working [facility]  Other  Outpatient Services - Pt will follow up  PCP issues    Choice offered to / List presented to:            Status of service:  Completed, signed off  ED Comments:   ED Comments Detail:

## 2013-08-20 ENCOUNTER — Telehealth (INDEPENDENT_AMBULATORY_CARE_PROVIDER_SITE_OTHER): Payer: Self-pay

## 2013-08-20 NOTE — Telephone Encounter (Signed)
Niece [name] was informed patient is to have scheduled Ct abd/pelvis 08-23-13@1100am  @ [facility]  , she is to be NPO after MN, she is start drinking contrast @ 9am which she is pick up  08-21-13

## 2013-08-20 NOTE — ED Provider Notes (Signed)
CSN: [id number]     Arrival date & time 08/14/13  1943 History   First MD Initiated Contact with Patient 08/14/13 1948     Chief Complaint  Patient presents with  . Fall     (Consider location/radiation/quality/duration/timing/severity/associated sxs/prior Treatment) HPI  80 year old female presenting after mechanical fall. Happened just before arrival. Patient was sitting to the side but she lost her balance and fell in her left side. She did strike her head. No loss of consciousness. Denies any significant headache, neck or back pain. She's complaining of pain primarily in her left shoulder/left upper arm and her left knee. No numbness or tingling. No acute visual changes. No nausea or vomiting. No intervention prior to arrival. Patient is also requesting Tylenol for pain.  Past Medical History  Diagnosis Date  . Heart attack    Past Surgical History  Procedure Laterality Date  . Colon surgery  2014    Colostomy  . Cardiac surgery  2009  . Coronary angioplasty with stent placement  2009  . C section  1947, 1963  . Cholecystectomy  1973  . Esophagogastroduodenoscopy  2013   Family History  Problem Relation Age of Onset  . Diverticulitis Mother   . Heart attack Mother   . Ulcers Father    History  Substance Use Topics  . Smoking status: Former Research scientist (life sciences)  . Smokeless tobacco: Not on file  . Alcohol Use: No   OB History   Grav Para Term Preterm Abortions TAB SAB Ect Mult Living                 Review of Systems  All systems reviewed and negative, other than as noted in HPI.   Allergies  Epipen; Heparin; Levaquin; and Sulfa antibiotics  Home Medications   Prior to Admission medications   Medication Sig Start Date End Date Taking? Authorizing Provider  amLODipine (NORVASC) 5 MG tablet Take 5 mg by mouth daily.   Yes Historical Provider, MD  aspirin 81 MG tablet Take 81 mg by mouth every other day.    Yes Historical Provider, MD  carvedilol (COREG) 3.125 MG tablet  Take 3.125 mg by mouth 2 (two) times daily with a meal.   Yes Historical Provider, MD  cycloSPORINE (RESTASIS) 0.05 % ophthalmic emulsion 1 drop 2 (two) times daily.   Yes Historical Provider, MD  ezetimibe (ZETIA) 10 MG tablet Take 10 mg by mouth daily.    Yes Historical Provider, MD  furosemide (LASIX) 20 MG tablet Take 20 mg by mouth.   Yes Historical Provider, MD  levothyroxine (SYNTHROID, LEVOTHROID) 88 MCG tablet Take 88 mcg by mouth daily before breakfast.   Yes Historical Provider, MD  lisinopril (PRINIVIL,ZESTRIL) 20 MG tablet Take 20 mg by mouth 2 (two) times daily.   Yes Historical Provider, MD  Multiple Vitamins-Minerals (CENTRUM SILVER) CHEW Chew by mouth daily.   Yes Historical Provider, MD  pantoprazole (PROTONIX) 40 MG tablet Take 40 mg by mouth daily.   Yes Historical Provider, MD  rOPINIRole (REQUIP) 1 MG tablet Take 1 mg by mouth at bedtime.   Yes Historical Provider, MD  sertraline (ZOLOFT) 25 MG tablet Take 25 mg by mouth daily.   Yes Historical Provider, MD  simvastatin (ZOCOR) 40 MG tablet Take 40 mg by mouth daily.   Yes Historical Provider, MD  Tiotropium Bromide Monohydrate (SPIRIVA RESPIMAT) 2.5 MCG/ACT AERS Inhale 1 puff into the lungs daily.   Yes Historical Provider, MD  acetaminophen (TYLENOL) 500 MG tablet Take 2  tablets (1,000 mg total) by mouth every 6 (six) hours as needed for mild pain. 08/19/13   Kristen N Ward, DO  docusate sodium (COLACE) 100 MG capsule Take 1 capsule (100 mg total) by mouth every 12 (twelve) hours. 08/19/13   Kristen N Ward, DO  polyethylene glycol (MIRALAX / GLYCOLAX) packet Take 17 g by mouth daily. 08/19/13   Kristen N Ward, DO  traMADol (ULTRAM) 50 MG tablet Take 1 tablet (50 mg total) by mouth every 6 (six) hours as needed. 08/14/13   Virgel Manifold, MD   BP 163/55  Pulse 91  Temp(Src) 98.1 F (36.7 C) (Oral)  Resp 16  SpO2 96% Physical Exam  Nursing note and vitals reviewed. Constitutional: She appears well-developed and  well-nourished. No distress.  HENT:  Head: Normocephalic.  Superficial abrasion to the left 4 head near the hairline and 6 in the left temporal region. No bony tenderness. No midline spinal tenderness.  Eyes: Conjunctivae are normal. Right eye exhibits no discharge. Left eye exhibits no discharge.  Neck: Neck supple.  Cardiovascular: Normal rate, regular rhythm and normal heart sounds.  Exam reveals no gallop and no friction rub.   No murmur heard. Pulmonary/Chest: Effort normal and breath sounds normal. No respiratory distress.  Abdominal: Soft. She exhibits no distension. There is no tenderness.  Musculoskeletal: She exhibits no edema and no tenderness.  Tenderness to palpation over the left proximal humerus and increased pain with range of motion of the left shoulder. No deformity. Overlying skin changes. Neurovascular intact distally. Some mild tenderness to palpation of the anterior left knee with some mild ecchymosis. Poor range of motion at the knee actively but with some increased pain. Skin is intact. Neurovascular intact distally. No midline spinal tenderness. No apparent pain with range of motion large joints otherwise.  Neurological: She is alert. No cranial nerve deficit. She exhibits normal muscle tone. Coordination normal.  Skin: Skin is warm and dry.  Psychiatric: She has a normal mood and affect. Her behavior is normal. Thought content normal.    ED Course  Procedures (including critical care time) Labs Review Labs Reviewed  ACETAMINOPHEN LEVEL    Dg Shoulder Left  08/14/2013   CLINICAL DATA:  Fall  EXAM: LEFT SHOULDER - 2+ VIEW  COMPARISON:  None.  FINDINGS: No acute fracture. No dislocation. Chronic changes in the superior lateral humeral head.  IMPRESSION: No acute bony pathology.   Electronically Signed   By: Maryclare Bean M.D.   On: 08/14/2013 20:34   Dg Knee Complete 4 Views Left  08/19/2013   CLINICAL DATA:  Fall  EXAM: LEFT KNEE - COMPLETE 4+ VIEW  COMPARISON:  None.   FINDINGS: No acute fracture. No dislocation. Osteopenia. Vascular calcifications are noted.  IMPRESSION: No acute bony pathology.  Chronic changes.   Electronically Signed   By: Maryclare Bean M.D.   On: 08/19/2013 17:03  Imaging Review   EKG Interpretation None      MDM   Final diagnoses:  Contusion of arm, left  Facial contusion    80 year old female presenting for evaluation of arm pain after mechanical fall. Patient does have some superficial abrasions to her face without any significant bony tenderness. There is no loss of consciousness. Denies any headaches. Is no midline spinal tenderness. She's only on 81 mg of aspirin. Otherwise no blood thinners. Patient has a nonfocal neurological examination. Neuroimaging was considered, but deferred. Imaging of her knee and left shoulder are unremarkable. Plan symptomatic treatment of likely contusion. Return  cautions were discussed. Outpatient followup otherwise.     Virgel Manifold, MD 08/20/13 [phone]

## 2013-08-23 ENCOUNTER — Ambulatory Visit
Admission: RE | Admit: 2013-08-23 | Discharge: 2013-08-23 | Disposition: A | Discharge status: Home / Self Care | Payer: Medicare Other | Source: Ambulatory Visit | Attending: General Surgery | Admitting: General Surgery

## 2013-08-23 DIAGNOSIS — K9403 Colostomy malfunction: Secondary | ICD-10-CM

## 2013-08-23 MED ORDER — IOHEXOL 300 MG/ML  SOLN
100.0000 mL | Freq: Once | INTRAMUSCULAR | Status: AC | PRN
  Administered 2013-08-23: 100 mL via INTRAVENOUS

## 2013-08-26 ENCOUNTER — Telehealth (INDEPENDENT_AMBULATORY_CARE_PROVIDER_SITE_OTHER): Payer: Self-pay

## 2013-08-26 NOTE — Telephone Encounter (Signed)
Pt calling in to return a call back from [place], unsure of Glenda's need with pt. Pt advised will route this message to [facility] to have Holley Raring return their call back

## 2013-08-27 ENCOUNTER — Telehealth (INDEPENDENT_AMBULATORY_CARE_PROVIDER_SITE_OTHER): Payer: Self-pay

## 2013-08-27 ENCOUNTER — Other Ambulatory Visit (INDEPENDENT_AMBULATORY_CARE_PROVIDER_SITE_OTHER): Payer: Self-pay

## 2013-08-27 ENCOUNTER — Ambulatory Visit (INDEPENDENT_AMBULATORY_CARE_PROVIDER_SITE_OTHER): Payer: Medicare Other | Admitting: General Surgery

## 2013-08-27 ENCOUNTER — Encounter (INDEPENDENT_AMBULATORY_CARE_PROVIDER_SITE_OTHER): Payer: Self-pay | Admitting: General Surgery

## 2013-08-27 VITALS — BP 132/80 | HR 68 | Temp 97.8°F | Resp 16 | Wt 178.0 lb

## 2013-08-27 DIAGNOSIS — R1900 Intra-abdominal and pelvic swelling, mass and lump, unspecified site: Secondary | ICD-10-CM

## 2013-08-27 DIAGNOSIS — K9403 Colostomy malfunction: Secondary | ICD-10-CM

## 2013-08-27 DIAGNOSIS — R1902 Left upper quadrant abdominal swelling, mass and lump: Secondary | ICD-10-CM

## 2013-08-27 DIAGNOSIS — K9413 Enterostomy malfunction: Secondary | ICD-10-CM

## 2013-08-27 NOTE — Patient Instructions (Signed)
We will schedule you for a CT guided biopsy of your abdominal mass. I will call you with the results.

## 2013-08-27 NOTE — Progress Notes (Signed)
Makayla Fisher is a 80 y.o. 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 We have gotten a CT scan to evaluate her remaining colon. She does appear to have some room available for a local repair. Of note the CT was also concerning for a large retroperitoneal mass.  Since her last visit, she has found an ostomy appliance that seems to be working better for her. She is taking MiraLax and Colace to help with her stool consistency. She is not having a lot of trouble pouching at the moment.  Objective: Filed Vitals:   08/27/13 1144  BP: 132/80  Pulse: 68  Temp: 97.8 F (36.6 C)  Resp: 16    General appearance: alert and cooperative GI: normal findings: sore to palpation in upper abdomen ostomy unchanged   Assessment and Plan: Given that her ostomy pouching issues are stable, I recommended that we work up her abdominal mass. I referred her for a CT-guided biopsy. We will reassess the ostomy once the workup is completed.    Rosario Adie, MD [facility], [place]

## 2013-08-27 NOTE — Telephone Encounter (Signed)
[name] ABD. MASS . Date and time will be determined after radiologist reviews history ,rad tech will call patient with date and time . Scheduling should call  Makayla Fisher @ [phone]

## 2013-08-28 ENCOUNTER — Other Ambulatory Visit (INDEPENDENT_AMBULATORY_CARE_PROVIDER_SITE_OTHER): Payer: Self-pay | Admitting: General Surgery

## 2013-08-28 DIAGNOSIS — R1900 Intra-abdominal and pelvic swelling, mass and lump, unspecified site: Secondary | ICD-10-CM

## 2013-08-29 ENCOUNTER — Other Ambulatory Visit (INDEPENDENT_AMBULATORY_CARE_PROVIDER_SITE_OTHER): Payer: Self-pay

## 2013-08-29 DIAGNOSIS — R1900 Intra-abdominal and pelvic swelling, mass and lump, unspecified site: Secondary | ICD-10-CM

## 2013-08-29 NOTE — Telephone Encounter (Signed)
Spoke with Makayla Fisher/ [facility] who informed me patient [name] was reviewed by DR. Macula and [name] has been scheduled for 09-04-13 @ [facility] ; Patient is to arrive at 10:45 for a 1p [name]

## 2013-08-29 NOTE — Telephone Encounter (Signed)
Toni/[facility] called to inform me location of [name] has changed from [facility]. To [facility] @ 11:00am with 9:45 arrival . Informed Crista Elliot and she is aware patient needs a 24 hr before [name] 09-06-13. [name] will go to [facility] lab to pick up 24 hr collection jug with instructions tomorrow , she will start the collection this  Sunday or Monday so results will be available before [name]

## 2013-08-30 ENCOUNTER — Encounter (HOSPITAL_COMMUNITY): Payer: Self-pay | Admitting: Pharmacy Technician

## 2013-08-30 ENCOUNTER — Other Ambulatory Visit: Payer: Self-pay | Admitting: Radiology

## 2013-09-02 ENCOUNTER — Other Ambulatory Visit (INDEPENDENT_AMBULATORY_CARE_PROVIDER_SITE_OTHER): Payer: Self-pay | Admitting: General Surgery

## 2013-09-02 ENCOUNTER — Other Ambulatory Visit (INDEPENDENT_AMBULATORY_CARE_PROVIDER_SITE_OTHER): Payer: Self-pay

## 2013-09-03 LAB — METANEPHRINES, URINE, 24 HOUR

## 2013-09-04 ENCOUNTER — Ambulatory Visit (HOSPITAL_COMMUNITY): Payer: Medicare Other

## 2013-09-04 ENCOUNTER — Ambulatory Visit (HOSPITAL_COMMUNITY)
Admission: RE | Admit: 2013-09-04 | Discharge: 2013-09-04 | Disposition: A | Discharge status: Home / Self Care | Payer: Medicare Other | Source: Ambulatory Visit | Attending: General Surgery | Admitting: General Surgery

## 2013-09-04 ENCOUNTER — Encounter (HOSPITAL_COMMUNITY): Payer: Self-pay

## 2013-09-04 VITALS — BP 151/62 | HR 77 | Temp 97.8°F | Resp 11 | Ht 62.0 in | Wt 175.0 lb

## 2013-09-04 DIAGNOSIS — R599 Enlarged lymph nodes, unspecified: Secondary | ICD-10-CM | POA: Insufficient documentation

## 2013-09-04 DIAGNOSIS — R1900 Intra-abdominal and pelvic swelling, mass and lump, unspecified site: Secondary | ICD-10-CM

## 2013-09-04 DIAGNOSIS — R1909 Other intra-abdominal and pelvic swelling, mass and lump: Secondary | ICD-10-CM | POA: Insufficient documentation

## 2013-09-04 DIAGNOSIS — C8589 Other specified types of non-Hodgkin lymphoma, extranodal and solid organ sites: Secondary | ICD-10-CM | POA: Insufficient documentation

## 2013-09-04 LAB — PROTIME-INR
INR: 1 (ref 0.00–1.49)
Prothrombin Time: 13 seconds (ref 11.6–15.2)

## 2013-09-04 LAB — CBC
HCT: 35.6 % — ABNORMAL LOW (ref 36.0–46.0)
Hemoglobin: 11.7 g/dL — ABNORMAL LOW (ref 12.0–15.0)
MCH: 27.4 pg (ref 26.0–34.0)
MCHC: 32.9 g/dL (ref 30.0–36.0)
MCV: 83.4 fL (ref 78.0–100.0)
Platelets: 344 10*3/uL (ref 150–400)
RBC: 4.27 MIL/uL (ref 3.87–5.11)
RDW: 16.9 % — ABNORMAL HIGH (ref 11.5–15.5)
WBC: 9.4 10*3/uL (ref 4.0–10.5)

## 2013-09-04 LAB — APTT: aPTT: 30 seconds (ref 24–37)

## 2013-09-04 MED ORDER — FENTANYL CITRATE 0.05 MG/ML IJ SOLN
INTRAMUSCULAR | Status: AC
  Filled 2013-09-04: qty 4

## 2013-09-04 MED ORDER — FENTANYL CITRATE 0.05 MG/ML IJ SOLN
INTRAMUSCULAR | Status: AC | PRN
  Administered 2013-09-04: 50 ug via INTRAVENOUS
  Administered 2013-09-04 (×2): 25 ug via INTRAVENOUS

## 2013-09-04 MED ORDER — HYDROCODONE-ACETAMINOPHEN 5-325 MG PO TABS
1.0000 | ORAL_TABLET | ORAL | Status: DC | PRN
  Filled 2013-09-04: qty 2

## 2013-09-04 MED ORDER — SODIUM CHLORIDE 0.9 % IV SOLN: Freq: Once | INTRAVENOUS | Status: DC

## 2013-09-04 MED ORDER — LIDOCAINE HCL 1 % IJ SOLN
INTRAMUSCULAR | Status: AC
  Filled 2013-09-04: qty 10

## 2013-09-04 MED ORDER — MIDAZOLAM HCL 2 MG/2ML IJ SOLN
INTRAMUSCULAR | Status: AC | PRN
  Administered 2013-09-04 (×2): 1 mg via INTRAVENOUS

## 2013-09-04 MED ORDER — MIDAZOLAM HCL 2 MG/2ML IJ SOLN
INTRAMUSCULAR | Status: AC
Start: 2013-09-04 — End: 2013-09-04
  Filled 2013-09-04: qty 4

## 2013-09-04 NOTE — Discharge Instructions (Signed)
Biopsy °Care After °Refer to this sheet in the next few weeks. These instructions provide you with information on caring for yourself after your procedure. Your caregiver may also give you more specific instructions. Your treatment has been planned according to current medical practices, but problems sometimes occur. Call your caregiver if you have any problems or questions after your procedure. °If you had a fine needle biopsy, you may have soreness at the biopsy site for 1 to 2 days. If you had an open biopsy, you may have soreness at the biopsy site for 3 to 4 days. °HOME CARE INSTRUCTIONS  °· You may resume normal diet and activities as directed. °· Change bandages (dressings) as directed. If your wound was closed with a skin glue (adhesive), it will wear off and begin to peel in 7 days. °· Only take over-the-counter or prescription medicines for pain, discomfort, or fever as directed by your caregiver. °· Ask your caregiver when you can bathe and get your wound wet. °SEEK IMMEDIATE MEDICAL CARE IF:  °· You have increased bleeding (more than a small spot) from the biopsy site. °· You notice redness, swelling, or increasing pain at the biopsy site. °· You have pus coming from the biopsy site. °· You have a fever. °· You notice a bad smell coming from the biopsy site or dressing. °· You have a rash, have difficulty breathing, or have any allergic problems. °MAKE SURE YOU:  °· Understand these instructions. °· Will watch your condition. °· Will get help right away if you are not doing well or get worse. °Document Released: 10/29/2004 Document Revised: 07/04/2011 Document Reviewed: 10/07/2010 °ExitCare® Patient Information ©2014 [employer]. ° °

## 2013-09-04 NOTE — Procedures (Signed)
Interventional Radiology Procedure Note  Procedure: CT biopsy of deep mesenteric nodes Complications: None Recommendations: - Bedrest x 1 hr - path pending  Interventional Radiology Procedure Note  Procedure: Complications: Recommendations:  Signed,  Criselda Peaches, MD [facility]

## 2013-09-04 NOTE — H&P (Signed)
Makayla Fisher is an 80 y.o. female.   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 Heart attack     Past Surgical History  Procedure Laterality Date  . Colon surgery  2014    Colostomy  . Cardiac surgery  2009  . Coronary angioplasty with stent placement  2009  . C section  1947, 1963  . Cholecystectomy  1973  . Esophagogastroduodenoscopy  2013    Family History  Problem Relation Age of Onset  . Diverticulitis Mother   . Heart attack Mother   . Ulcers Father    Social History:  reports that she has quit smoking. She does not have any smokeless tobacco history on file. She reports that she does not drink alcohol or use illicit drugs.  Allergies:  Allergies  Allergen Reactions  . Epipen [Epinephrine Hcl]     Heart palpitations  . Heparin     Unsure of side affects (maybe affected platelets per pt)  . Levaquin [Levofloxacin In D5w] Other (See Comments)    Weakness  . Sulfa Antibiotics Rash     (Not in a hospital admission)  Results for orders placed during the hospital encounter of 09/04/13 (from the past 48 hour(s))  CBC     Status: Abnormal   Collection Time    09/04/13 10:15 AM      Result Value Ref Range   WBC 9.4  4.0 - 10.5 K/uL   RBC 4.27  3.87 - 5.11 MIL/uL   Hemoglobin 11.7 (*) 12.0 - 15.0 g/dL   HCT 35.6 (*) 36.0 - 46.0 %   MCV 83.4  78.0 - 100.0 fL   MCH 27.4  26.0 - 34.0 pg   MCHC 32.9  30.0 - 36.0 g/dL   RDW 16.9 (*) 11.5 - 15.5 %   Platelets 344  150 - 400 K/uL   No results found.  Review of Systems  Constitutional: Negative for fever and weight loss.  Respiratory: Negative for cough and shortness of breath.   Cardiovascular:       Chest wall pain Fell at home 3 weeks ago   Gastrointestinal: Negative for nausea, vomiting and abdominal pain.  Musculoskeletal: Positive for back pain, falls, joint pain and neck pain.  Neurological: Positive for weakness. Negative for dizziness.  Psychiatric/Behavioral: Negative for substance abuse.    Blood pressure 213/85, pulse 81, temperature 97.8 F (36.6 C), temperature source Oral, resp. rate 20, height 5\' 2"  (1.575 m), weight 79.379 kg (175 lb), SpO2 100.00%. Physical Exam  Constitutional: She is oriented to person, place, and time. She appears well-developed.  Cardiovascular: Normal rate and regular rhythm.   No murmur heard. Respiratory: Effort normal and breath sounds normal. She has no wheezes.  GI: Soft. Bowel sounds are normal. There is tenderness.  Musculoskeletal: Normal range of motion.  Uses walker  Neurological: She is alert and oriented to person, place, and time.  Skin: Skin is warm and dry.  Psychiatric: She has a normal mood and affect. Her behavior is normal. Judgment and thought content normal.     Assessment/Plan Fell at home 3 weeks ago- tripped Work up incidentally reveals Rt mesentery mass Scheduled now for bx of same No Ca Hx Pt aware of procedure benefits and  risks and agreeable to proceed Consent signed and in chart  Lavonia Drafts 09/04/2013, 11:05 AM

## 2013-09-06 ENCOUNTER — Telehealth (INDEPENDENT_AMBULATORY_CARE_PROVIDER_SITE_OTHER): Payer: Self-pay | Admitting: General Surgery

## 2013-09-06 ENCOUNTER — Other Ambulatory Visit (INDEPENDENT_AMBULATORY_CARE_PROVIDER_SITE_OTHER): Payer: Self-pay | Admitting: General Surgery

## 2013-09-06 ENCOUNTER — Telehealth (INDEPENDENT_AMBULATORY_CARE_PROVIDER_SITE_OTHER): Payer: Self-pay

## 2013-09-06 DIAGNOSIS — C8590 Non-Hodgkin lymphoma, unspecified, unspecified site: Secondary | ICD-10-CM

## 2013-09-06 NOTE — Telephone Encounter (Signed)
[name] was calling to get her aunt's path results. She was wondering if someone to give her a call back if these path results are ready.

## 2013-09-06 NOTE — Telephone Encounter (Signed)
Discuss biopsy results with the patient's family. They have requested referral to Dr. Marin Olp for evaluation. We will address her colostomy once she has been seen by oncology.

## 2013-09-13 ENCOUNTER — Telehealth (INDEPENDENT_AMBULATORY_CARE_PROVIDER_SITE_OTHER): Payer: Self-pay

## 2013-09-13 NOTE — Telephone Encounter (Signed)
Makayla Fisher will schedule after DR. Ennever  View the notes per [employer]

## 2013-09-13 NOTE — Telephone Encounter (Signed)
Mrs Mervyn Skeeters states patient has not received a call from [facility] . [facility] states he needs to call Liliane Channel # [phone] for appointment for DR. Ennever . V/M to call patient with Date/time

## 2013-09-18 ENCOUNTER — Telehealth: Payer: Self-pay | Admitting: Hematology & Oncology

## 2013-09-18 NOTE — Telephone Encounter (Signed)
I spoke w NEW PATIENT (physical therapist Patsy Baltimore) today to remind them of their appointment with Dr. Marin Olp. Also, advised them to bring all medication bottles and insurance card information.  Makayla Fisher, advised pt's daughter [name]) will be coming with her.

## 2013-09-19 ENCOUNTER — Encounter: Payer: Medicare Other | Admitting: Hematology & Oncology

## 2013-09-19 ENCOUNTER — Emergency Department (HOSPITAL_COMMUNITY): Payer: Medicare Other

## 2013-09-19 ENCOUNTER — Encounter: Payer: Self-pay | Admitting: *Deleted

## 2013-09-19 ENCOUNTER — Encounter (HOSPITAL_COMMUNITY): Payer: Self-pay | Admitting: Emergency Medicine

## 2013-09-19 ENCOUNTER — Ambulatory Visit: Payer: Medicare Other

## 2013-09-19 ENCOUNTER — Other Ambulatory Visit: Payer: Medicare Other | Admitting: Lab

## 2013-09-19 ENCOUNTER — Emergency Department (HOSPITAL_COMMUNITY)
Admission: EM | Admit: 2013-09-19 | Discharge: 2013-09-19 | Disposition: A | Discharge status: Home / Self Care | Payer: Medicare Other | Attending: Emergency Medicine | Admitting: Emergency Medicine

## 2013-09-19 DIAGNOSIS — Z9861 Coronary angioplasty status: Secondary | ICD-10-CM | POA: Insufficient documentation

## 2013-09-19 DIAGNOSIS — L539 Erythematous condition, unspecified: Secondary | ICD-10-CM | POA: Insufficient documentation

## 2013-09-19 DIAGNOSIS — I252 Old myocardial infarction: Secondary | ICD-10-CM | POA: Insufficient documentation

## 2013-09-19 DIAGNOSIS — Z87891 Personal history of nicotine dependence: Secondary | ICD-10-CM | POA: Insufficient documentation

## 2013-09-19 DIAGNOSIS — R51 Headache: Secondary | ICD-10-CM | POA: Insufficient documentation

## 2013-09-19 DIAGNOSIS — C8589 Other specified types of non-Hodgkin lymphoma, extranodal and solid organ sites: Secondary | ICD-10-CM | POA: Insufficient documentation

## 2013-09-19 DIAGNOSIS — Z933 Colostomy status: Secondary | ICD-10-CM | POA: Insufficient documentation

## 2013-09-19 DIAGNOSIS — R519 Headache, unspecified: Secondary | ICD-10-CM

## 2013-09-19 MED ORDER — ACETAMINOPHEN 325 MG PO TABS
650.0000 mg | ORAL_TABLET | Freq: Once | ORAL | Status: AC
  Administered 2013-09-19: 650 mg via ORAL
  Filled 2013-09-19: qty 2

## 2013-09-19 MED ORDER — TRAMADOL HCL 50 MG PO TABS
50.0000 mg | ORAL_TABLET | Freq: Once | ORAL | Status: AC
  Administered 2013-09-19: 50 mg via ORAL
  Filled 2013-09-19: qty 1

## 2013-09-19 MED ORDER — METOCLOPRAMIDE HCL 10 MG PO TABS
10.0000 mg | ORAL_TABLET | Freq: Once | ORAL | Status: AC
  Administered 2013-09-19: 10 mg via ORAL
  Filled 2013-09-19: qty 1

## 2013-09-19 MED ORDER — DIPHENHYDRAMINE HCL 25 MG PO CAPS
25.0000 mg | ORAL_CAPSULE | Freq: Once | ORAL | Status: AC
  Administered 2013-09-19: 25 mg via ORAL
  Filled 2013-09-19: qty 1

## 2013-09-19 NOTE — Discharge Instructions (Signed)
Please follow up with your primary care physician in 1-2 days. If you do not have one please call the [place] number listed above. Please follow up with Dr. Marin Olp to schedule a follow up appointment. Please read all discharge instructions and return precautions.    Headaches, Frequently Asked Questions MIGRAINE HEADACHES Q: What is migraine? What causes it? How can I treat it? A: Generally, migraine headaches begin as a dull ache. Then they develop into a constant, throbbing, and pulsating pain. You may experience pain at the temples. You may experience pain at the front or back of one or both sides of the head. The pain is usually accompanied by a combination of:  Nausea.  Vomiting.  Sensitivity to light and noise. Some people (about 15%) experience an aura (see below) before an attack. The cause of migraine is believed to be chemical reactions in the brain. Treatment for migraine may include over-the-counter or prescription medications. It may also include self-help techniques. These include relaxation training and biofeedback.  Q: What is an aura? A: About 15% of people with migraine get an "aura". This is a sign of neurological symptoms that occur before a migraine headache. You may see wavy or jagged lines, dots, or flashing lights. You might experience tunnel vision or blind spots in one or both eyes. The aura can include visual or auditory hallucinations (something imagined). It may include disruptions in smell (such as strange odors), taste or touch. Other symptoms include:  Numbness.  A "pins and needles" sensation.  Difficulty in recalling or speaking the correct word. These neurological events may last as long as 60 minutes. These symptoms will fade as the headache begins. Q: What is a trigger? A: Certain physical or environmental factors can lead to or "trigger" a migraine. These include:  Foods.  Hormonal changes.  Weather.  Stress. It is  important to remember that triggers are different for everyone. To help prevent migraine attacks, you need to figure out which triggers affect you. Keep a headache diary. This is a good way to track triggers. The diary will help you talk to your healthcare professional about your condition. Q: Does weather affect migraines? A: Bright sunshine, hot, humid conditions, and drastic changes in barometric pressure may lead to, or "trigger," a migraine attack in some people. But studies have shown that weather does not act as a trigger for everyone with migraines. Q: What is the link between migraine and hormones? A: Hormones start and regulate many of your body's functions. Hormones keep your body in balance within a constantly changing environment. The levels of hormones in your body are unbalanced at times. Examples are during menstruation, pregnancy, or menopause. That can lead to a migraine attack. In fact, about three quarters of all women with migraine report that their attacks are related to the menstrual cycle.  Q: Is there an increased risk of stroke for migraine sufferers? A: The likelihood of a migraine attack causing a stroke is very remote. That is not to say that migraine sufferers cannot have a stroke associated with their migraines. In persons under age 6, the most common associated factor for stroke is migraine headache. But over the course of a person's normal life span, the occurrence of migraine headache may actually be associated with a reduced risk of dying from cerebrovascular disease due to stroke.  Q: What are acute medications for migraine? A: Acute medications are used to treat the pain of the headache after it has  started. Examples over-the-counter medications, NSAIDs, ergots, and triptans.  Q: What are the triptans? A: Triptans are the newest class of abortive medications. They are specifically targeted to treat migraine. Triptans are vasoconstrictors. They moderate some chemical  reactions in the brain. The triptans work on receptors in your brain. Triptans help to restore the balance of a neurotransmitter called serotonin. Fluctuations in levels of serotonin are thought to be a main cause of migraine.  Q: Are over-the-counter medications for migraine effective? A: Over-the-counter, or "OTC," medications may be effective in relieving mild to moderate pain and associated symptoms of migraine. But you should see your caregiver before beginning any treatment regimen for migraine.  Q: What are preventive medications for migraine? A: Preventive medications for migraine are sometimes referred to as "prophylactic" treatments. They are used to reduce the frequency, severity, and length of migraine attacks. Examples of preventive medications include antiepileptic medications, antidepressants, beta-blockers, calcium channel blockers, and NSAIDs (nonsteroidal anti-inflammatory drugs). Q: Why are anticonvulsants used to treat migraine? A: During the past few years, there has been an increased interest in antiepileptic drugs for the prevention of migraine. They are sometimes referred to as "anticonvulsants". Both epilepsy and migraine may be caused by similar reactions in the brain.  Q: Why are antidepressants used to treat migraine? A: Antidepressants are typically used to treat people with depression. They may reduce migraine frequency by regulating chemical levels, such as serotonin, in the brain.  Q: What alternative therapies are used to treat migraine? A: The term "alternative therapies" is often used to describe treatments considered outside the scope of conventional Western medicine. Examples of alternative therapy include acupuncture, acupressure, and yoga. Another common alternative treatment is herbal therapy. Some herbs are believed to relieve headache pain. Always discuss alternative therapies with your caregiver before proceeding. Some herbal products contain arsenic and other  toxins. TENSION HEADACHES Q: What is a tension-type headache? What causes it? How can I treat it? A: Tension-type headaches occur randomly. They are often the result of temporary stress, anxiety, fatigue, or anger. Symptoms include soreness in your temples, a tightening band-like sensation around your head (a "vice-like" ache). Symptoms can also include a pulling feeling, pressure sensations, and contracting head and neck muscles. The headache begins in your forehead, temples, or the back of your head and neck. Treatment for tension-type headache may include over-the-counter or prescription medications. Treatment may also include self-help techniques such as relaxation training and biofeedback. CLUSTER HEADACHES Q: What is a cluster headache? What causes it? How can I treat it? A: Cluster headache gets its name because the attacks come in groups. The pain arrives with little, if any, warning. It is usually on one side of the head. A tearing or bloodshot eye and a runny nose on the same side of the headache may also accompany the pain. Cluster headaches are believed to be caused by chemical reactions in the brain. They have been described as the most severe and intense of any headache type. Treatment for cluster headache includes prescription medication and oxygen. SINUS HEADACHES Q: What is a sinus headache? What causes it? How can I treat it? A: When a cavity in the bones of the face and skull (a sinus) becomes inflamed, the inflammation will cause localized pain. This condition is usually the result of an allergic reaction, a tumor, or an infection. If your headache is caused by a sinus blockage, such as an infection, you will probably have a fever. An x-ray will confirm a sinus blockage.  Your caregiver's treatment might include antibiotics for the infection, as well as antihistamines or decongestants.  REBOUND HEADACHES Q: What is a rebound headache? What causes it? How can I treat it? A: A pattern of  taking acute headache medications too often can lead to a condition known as "rebound headache." A pattern of taking too much headache medication includes taking it more than 2 days per week or in excessive amounts. That means more than the label or a caregiver advises. With rebound headaches, your medications not only stop relieving pain, they actually begin to cause headaches. Doctors treat rebound headache by tapering the medication that is being overused. Sometimes your caregiver will gradually substitute a different type of treatment or medication. Stopping may be a challenge. Regularly overusing a medication increases the potential for serious side effects. Consult a caregiver if you regularly use headache medications more than 2 days per week or more than the label advises. ADDITIONAL QUESTIONS AND ANSWERS Q: What is biofeedback? A: Biofeedback is a self-help treatment. Biofeedback uses special equipment to monitor your body's involuntary physical responses. Biofeedback monitors:  Breathing.  Pulse.  Heart rate.  Temperature.  Muscle tension.  Brain activity. Biofeedback helps you refine and perfect your relaxation exercises. You learn to control the physical responses that are related to stress. Once the technique has been mastered, you do not need the equipment any more. Q: Are headaches hereditary? A: Four out of five (80%) of people that suffer report a family history of migraine. Scientists are not sure if this is genetic or a family predisposition. Despite the uncertainty, a child has a 50% chance of having migraine if one parent suffers. The child has a 75% chance if both parents suffer.  Q: Can children get headaches? A: By the time they reach high school, most young people have experienced some type of headache. Many safe and effective approaches or medications can prevent a headache from occurring or stop it after it has begun.  Q: What type of doctor should I see to diagnose  and treat my headache? A: Start with your primary caregiver. Discuss his or her experience and approach to headaches. Discuss methods of classification, diagnosis, and treatment. Your caregiver may decide to recommend you to a headache specialist, depending upon your symptoms or other physical conditions. Having diabetes, allergies, etc., may require a more comprehensive and inclusive approach to your headache. The National Head[employer] Foundation will provide, upon request, a list of [facility] physician members in your state. Document Released: 07/02/2003 Document Revised: 07/04/2011 Document Reviewed: 12/10/2007 [facility] Patient Information 2014 [place].

## 2013-09-19 NOTE — ED Notes (Signed)
Pt in from assisted living via [facility], per report facility reported hypertensive episode with sys >200, upon EMS arrival pts BP had decreased, pt A&O x4, follows commands, speaks in complete sentences, hx of HTN, pt being evaluated by Oncologist for r/o Hodgkins dx, c/o HA onset yesterday more to L eye, today reports occipital HA, denies slurred speech, blurred vision, & gait changes

## 2013-09-19 NOTE — ED Provider Notes (Signed)
CSN: [id number]     Arrival date & time 09/19/13  1159 History   First MD Initiated Contact with Patient 09/19/13 1204     Chief Complaint  Patient presents with  . Headache     (Consider location/radiation/quality/duration/timing/severity/associated sxs/prior Treatment) HPI Comments: Patient is an 80 year old female past medical history significant for MI presented to the emergency department with a main complaint of left sided aching headache. Patient states it began yesterday morning and had been gradually increasing throughout the day. Patient states last night she took Tylenol with little to no improvement. She states she is able to go to bed she awoke her headache has been gradually improving. She states it is mostly in the left posterior portion of her head. Patient is also complaining about intermittent episodes of burning discomfort at colostomy site with bowel movements. She states she has had to change her colostomy bag more frequently. Denies any fevers, chills, nausea, vomiting, diarrhea, constipation, bloody or black stools, neck pain or stiffness, numbness or weakness, visual disturbances. Patient does endorse having similar headaches in the past. Has recently just been diagnosed with non-Hodgkin's lymphoma via biopsy she was due to see Dr. Jonette Eva today for her first appointment.  Patient is a 80 y.o. female presenting with headaches.  Headache Associated symptoms: no abdominal pain, no diarrhea, no fever, no nausea, no numbness, no photophobia and no vomiting     Past Medical History  Diagnosis Date  . Heart attack    Past Surgical History  Procedure Laterality Date  . Colon surgery  2014    Colostomy  . Cardiac surgery  2009  . Coronary angioplasty with stent placement  2009  . C section  1947, 1963  . Cholecystectomy  1973  . Esophagogastroduodenoscopy  2013   Family History  Problem Relation Age of Onset  . Diverticulitis Mother   . Heart attack Mother   . Ulcers  Father    History  Substance Use Topics  . Smoking status: Former Research scientist (life sciences)  . Smokeless tobacco: Not on file  . Alcohol Use: No   OB History   Grav Para Term Preterm Abortions TAB SAB Ect Mult Living                 Review of Systems  Constitutional: Negative for fever and chills.  Eyes: Negative for photophobia and visual disturbance.  Respiratory: Negative for shortness of breath.   Cardiovascular: Negative for chest pain.  Gastrointestinal: Negative for nausea, vomiting, abdominal pain and diarrhea.  Skin:       Skin irritation  Neurological: Positive for headaches. Negative for syncope, weakness, light-headedness and numbness.  All other systems reviewed and are negative.     Allergies  Epipen; Heparin; Levaquin; and Sulfa antibiotics  Home Medications   Prior to Admission medications   Medication Sig Start Date End Date Taking? Authorizing Provider  acetaminophen (TYLENOL) 500 MG tablet Take 2 tablets (1,000 mg total) by mouth every 6 (six) hours as needed for mild pain. 08/19/13   Kristen N Ward, DO  amLODipine (NORVASC) 5 MG tablet Take 5 mg by mouth daily.    Historical Provider, MD  aspirin 81 MG tablet Take 81 mg by mouth every other day.     Historical Provider, MD  carvedilol (COREG) 3.125 MG tablet Take 3.125 mg by mouth 2 (two) times daily with a meal.    Historical Provider, MD  cycloSPORINE (RESTASIS) 0.05 % ophthalmic emulsion 1 drop 2 (two) times daily.  Historical Provider, MD  docusate sodium (COLACE) 100 MG capsule Take 1 capsule (100 mg total) by mouth every 12 (twelve) hours. 08/19/13   Kristen N Ward, DO  ezetimibe (ZETIA) 10 MG tablet Take 10 mg by mouth daily.     Historical Provider, MD  furosemide (LASIX) 20 MG tablet Take 20 mg by mouth daily.     Historical Provider, MD  furosemide (LASIX) 20 MG tablet Take 20 mg by mouth daily as needed for fluid (for fluid in addition to daily dose).    Historical Provider, MD  lisinopril (PRINIVIL,ZESTRIL) 20  MG tablet Take 20 mg by mouth 2 (two) times daily.    Historical Provider, MD  Multiple Vitamins-Minerals (MULTIVITAMIN WITH MINERALS) tablet Take 1 tablet by mouth daily.    Historical Provider, MD  pantoprazole (PROTONIX) 40 MG tablet Take 40 mg by mouth daily.    Historical Provider, MD  polyethylene glycol (MIRALAX / GLYCOLAX) packet Take 17 g by mouth daily. 08/19/13   Kristen N Ward, DO  rOPINIRole (REQUIP) 1 MG tablet Take 1 mg by mouth at bedtime.    Historical Provider, MD  sertraline (ZOLOFT) 25 MG tablet Take 25 mg by mouth daily.    Historical Provider, MD  simvastatin (ZOCOR) 40 MG tablet Take 40 mg by mouth daily.    Historical Provider, MD  Tiotropium Bromide Monohydrate (SPIRIVA RESPIMAT) 2.5 MCG/ACT AERS Inhale 1 puff into the lungs daily.    Historical Provider, MD  traMADol (ULTRAM) 50 MG tablet Take 1 tablet (50 mg total) by mouth every 6 (six) hours as needed. 08/14/13   Virgel Manifold, MD   BP 113/45  Pulse 78  Temp(Src) 99 F (37.2 C) (Oral)  Resp 21  SpO2 96% Physical Exam  Nursing note and vitals reviewed. Constitutional: She is oriented to person, place, and time. She appears well-developed and well-nourished. No distress.  HENT:  Head: Normocephalic and atraumatic.  Right Ear: External ear normal.  Left Ear: External ear normal.  Nose: Nose normal.  Mouth/Throat: Oropharynx is clear and moist. No oropharyngeal exudate.  Eyes: Conjunctivae and EOM are normal. Pupils are equal, round, and reactive to light.  Neck: Normal range of motion. Neck supple.  Cardiovascular: Normal rate, regular rhythm, normal heart sounds and intact distal pulses.   Pulmonary/Chest: Effort normal and breath sounds normal. No respiratory distress.  Abdominal: Soft. Bowel sounds are normal. There is no tenderness.  Left lower abdominal colostomy bag in place. Bag is clean. Skin at ostomy site appears erythematous and irritated. No stool in bag.   Musculoskeletal: She exhibits no edema.   Moves all extremities without ataxia.   Neurological: She is alert and oriented to person, place, and time. She has normal strength. No cranial nerve deficit. Gait normal. GCS eye subscore is 4. GCS verbal subscore is 5. GCS motor subscore is 6.  Sensation grossly intact.  No pronator drift.  Bilateral heel-knee-shin intact.  Skin: Skin is warm and dry. She is not diaphoretic.    ED Course  Procedures (including critical care time) Medications  acetaminophen (TYLENOL) tablet 650 mg (650 mg Oral Given 09/19/13 1247)  traMADol (ULTRAM) tablet 50 mg (50 mg Oral Given 09/19/13 1314)  diphenhydrAMINE (BENADRYL) capsule 25 mg (25 mg Oral Given 09/19/13 1512)  metoCLOPramide (REGLAN) tablet 10 mg (10 mg Oral Given 09/19/13 1512)    Labs Review Labs Reviewed - No data to display  Imaging Review Ct Head Wo Contrast  09/19/2013   CLINICAL DATA:  Headache  EXAM: CT HEAD WITHOUT CONTRAST  TECHNIQUE: Contiguous axial images were obtained from the base of the skull through the vertex without intravenous contrast.  COMPARISON:  None.  FINDINGS: No skull fracture is noted. Paranasal sinuses and mastoid air cells are unremarkable. No intracranial hemorrhage, mass effect or midline shift. Mild cerebral atrophy. Periventricular and patchy subcortical white matter decreased attenuation is probable due to chronic small vessel ischemic changes. No acute cortical infarction. No mass lesion is noted on this unenhanced scan. Atherosclerotic calcifications of carotid siphon.  IMPRESSION: No acute intracranial abnormality. Cerebral atrophy. Periventricular and patchy subcortical white matter decreased attenuation probable due to chronic small vessel ischemic changes.   Electronically Signed   By: Lahoma Crocker M.D.   On: 09/19/2013 12:44     EKG Interpretation None      MDM   Final diagnoses:  Headache   Patient is very concerned about following up with Dr. Marin Olp and is wondering about possible admission,  discussed with patient and family that she is safe for discharge at this time and should follow up with Dr. Antonieta Pert office as an outpatient. They are agreeable to this.   Filed Vitals:   09/19/13 1545  BP: 113/45  Pulse: 78  Temp:   Resp: 21   Afebrile, NAD, non-toxic appearing, AAOx4.   1) HA: Pt HA treated and improved while in ED.  Presentation is like pts typical HA and non concerning for [facility], ICH, Meningitis, or temporal arteritis. Pt is afebrile with no focal neuro deficits, nuchal rigidity, or change in vision. CT scan was unremarkable.    2) Colostomy site irritation: Patient with skin irritation at colostomy site. Bag is clean no signs of gross breakdown. Abdomen is soft, nontender, nondistended with no peritoneal signs. Advised patient to go back to [place] for discussion of further management.  Patient d/w with Dr. Wilson Singer, agrees with plan.    Harlow Mares, PA-C 09/19/13 1614

## 2013-09-19 NOTE — Progress Notes (Signed)
Informed Dr Marin Olp that [name] called regarding the patient and informed the nurse that she was admitted to [facility] for possibility that she had a mini stroke.

## 2013-09-20 ENCOUNTER — Encounter (INDEPENDENT_AMBULATORY_CARE_PROVIDER_SITE_OTHER): Payer: TRICARE For Life (TFL) | Admitting: General Surgery

## 2013-09-20 NOTE — Progress Notes (Signed)
This encounter was created in error - please disregard.

## 2013-09-23 HISTORY — PX: OTHER SURGICAL HISTORY: SHX169

## 2013-09-25 ENCOUNTER — Other Ambulatory Visit (HOSPITAL_BASED_OUTPATIENT_CLINIC_OR_DEPARTMENT_OTHER): Payer: Medicare Other | Admitting: Lab

## 2013-09-25 ENCOUNTER — Ambulatory Visit: Payer: Medicare Other

## 2013-09-25 ENCOUNTER — Ambulatory Visit (HOSPITAL_BASED_OUTPATIENT_CLINIC_OR_DEPARTMENT_OTHER): Payer: Medicare Other | Admitting: Hematology & Oncology

## 2013-09-25 VITALS — BP 142/60 | HR 72 | Resp 16 | Ht 61.0 in | Wt 168.0 lb

## 2013-09-25 DIAGNOSIS — B1910 Unspecified viral hepatitis B without hepatic coma: Secondary | ICD-10-CM

## 2013-09-25 DIAGNOSIS — C8589 Other specified types of non-Hodgkin lymphoma, extranodal and solid organ sites: Secondary | ICD-10-CM

## 2013-09-25 DIAGNOSIS — K759 Inflammatory liver disease, unspecified: Secondary | ICD-10-CM

## 2013-09-25 DIAGNOSIS — C8590 Non-Hodgkin lymphoma, unspecified, unspecified site: Secondary | ICD-10-CM

## 2013-09-25 LAB — CBC WITH DIFFERENTIAL (CANCER CENTER ONLY)
BASO#: 0 10*3/uL (ref 0.0–0.2)
BASO%: 0.6 % (ref 0.0–2.0)
EOS ABS: 0.3 10*3/uL (ref 0.0–0.5)
EOS%: 3.9 % (ref 0.0–7.0)
HCT: 37.7 % (ref 34.8–46.6)
HEMOGLOBIN: 12.5 g/dL (ref 11.6–15.9)
LYMPH#: 2.7 10*3/uL (ref 0.9–3.3)
LYMPH%: 37.7 % (ref 14.0–48.0)
MCH: 28 pg (ref 26.0–34.0)
MCHC: 33.2 g/dL (ref 32.0–36.0)
MCV: 85 fL (ref 81–101)
MONO#: 0.8 10*3/uL (ref 0.1–0.9)
MONO%: 11.4 % (ref 0.0–13.0)
NEUT#: 3.3 10*3/uL (ref 1.5–6.5)
NEUT%: 46.4 % (ref 39.6–80.0)
Platelets: 236 10*3/uL (ref 145–400)
RBC: 4.46 10*6/uL (ref 3.70–5.32)
RDW: 16.6 % — ABNORMAL HIGH (ref 11.1–15.7)
WBC: 7.1 10*3/uL (ref 3.9–10.0)

## 2013-09-25 LAB — COMPREHENSIVE METABOLIC PANEL
ALBUMIN: 3.9 g/dL (ref 3.5–5.2)
ALT: <8 U/L (ref 0–35)
AST: 15 U/L (ref 0–37)
Alkaline Phosphatase: 90 U/L (ref 39–117)
BUN: 13 mg/dL (ref 6–23)
CALCIUM: 9.4 mg/dL (ref 8.4–10.5)
CHLORIDE: 98 meq/L (ref 96–112)
CO2: 25 mEq/L (ref 19–32)
CREATININE: 0.61 mg/dL (ref 0.50–1.10)
GLUCOSE: 103 mg/dL — AB (ref 70–99)
POTASSIUM: 4.1 meq/L (ref 3.5–5.3)
Sodium: 134 mEq/L — ABNORMAL LOW (ref 135–145)
Total Bilirubin: 0.4 mg/dL (ref 0.2–1.2)
Total Protein: 6.5 g/dL (ref 6.0–8.3)

## 2013-09-25 LAB — HEPATITIS PANEL, ACUTE
HCV Ab: NEGATIVE
HEP B C IGM: NONREACTIVE
Hep A IgM: NONREACTIVE
Hepatitis B Surface Ag: NEGATIVE

## 2013-09-25 LAB — LACTATE DEHYDROGENASE: LDH: 141 U/L (ref 94–250)

## 2013-09-25 LAB — URIC ACID: Uric Acid, Serum: 4.3 mg/dL (ref 2.4–7.0)

## 2013-09-25 LAB — RETICULOCYTES (CHCC)
ABS RETIC: 31.5 10*3/uL (ref 19.0–186.0)
RBC.: 4.5 MIL/uL (ref 3.87–5.11)
RETIC CT PCT: 0.7 % (ref 0.4–2.3)

## 2013-09-25 LAB — CHCC SATELLITE - SMEAR

## 2013-09-25 LAB — PREALBUMIN: PREALBUMIN: 23.5 mg/dL (ref 17.0–34.0)

## 2013-09-25 NOTE — Progress Notes (Signed)
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 New Patient  : I am here to see you  HPI: Makayla Fisher is a very nice 80 year old white female. She is originally from [place]. She's been down in [place] for 40 years. Her niece, who goes might hurt, brought her up to reason for him to live. Makayla Fisher's husband has bad dementia.  In [place] in November she had a bout of diverticulitis that required surgery. She had a colostomy. I saw the operative report. This did not make any obvious intra-abdominal issues.  She was trying to get in the colostomy reversed. She saw Dr. Marcello Moores of [facility] surgery. Dr. she got a CT scan. A scan done in late April this year and initially did not show any obvious intra-abdominal issues. However, an addendum was made which suggested a mesenteric mass of lymph nodes.  A second CT scan was done on May 1. This did show a area of enlarged lymph nodes in the right central mesentery. This was more noted because oral contrast. This measures at 3.8 x 2.3 cm. She did have a left kidney cyst measuring 11 cm. She had no obvious liver or spleen abnormalities.  She so swelling and underwent a biopsy. This was done on May 13. The pathology report ([id number]) showed a non-Hodgkin's B-cell lymphoma. This was CD20 positive. This was CD 10 positive. It was felt to be follicular. The pathologist could not exclude a possible diffuse large cell component. Of course, more biopsies was recommended.  She has not had any other scans.  She's had no obvious fever. There's been no sweats. She's had no obvious weight loss. She's had no palpable lymph glands. She's had no change with a colostomy. There's been no cough. She's had no headache.  She has a performance status ECOG 2-3.    Past Medical History  Diagnosis Date  . Heart attack   :  Past Surgical History  Procedure Laterality Date  . Colon  surgery  2014    Colostomy  . Cardiac surgery  2009  . Coronary angioplasty with stent placement  2009  . C section  1947, 1963  . Cholecystectomy  1973  . Esophagogastroduodenoscopy  2013  :  Current outpatient prescriptions:acetaminophen (TYLENOL) 500 MG tablet, Take 2 tablets (1,000 mg total) by mouth every 6 (six) hours as needed for mild pain., Disp: 30 tablet, Rfl: 0;  amLODipine (NORVASC) 5 MG tablet, Take 5 mg by mouth daily., Disp: , Rfl: ;  aspirin 81 MG tablet, Take 81 mg by mouth every other day. , Disp: , Rfl: ;  carvedilol (COREG) 3.125 MG tablet, Take 3.125 mg by mouth daily. , Disp: , Rfl:  cycloSPORINE (RESTASIS) 0.05 % ophthalmic emulsion, Place 1 drop into both eyes 2 (two) times daily. , Disp: , Rfl: ;  docusate sodium (COLACE) 100 MG capsule, Take 1 capsule (100 mg total) by mouth every 12 (twelve) hours., Disp: 60 capsule, Rfl: 0;  ezetimibe (ZETIA) 10 MG tablet, Take 10 mg by mouth daily. , Disp: , Rfl: ;  furosemide (LASIX) 20 MG tablet, Take 20 mg by mouth daily. , Disp: , Rfl:  levothyroxine (SYNTHROID, LEVOTHROID) 75 MCG tablet, Take 75 mcg by mouth daily before breakfast., Disp: , Rfl: ;  lisinopril (PRINIVIL,ZESTRIL) 20 MG tablet, Take 20 mg by mouth 2 (two) times daily., Disp: , Rfl: ;  pantoprazole (PROTONIX) 40 MG tablet, Take 40  mg by mouth daily., Disp: , Rfl: ;  polyethylene glycol (MIRALAX / GLYCOLAX) packet, Take 17 g by mouth daily., Disp: 14 each, Rfl: 0 rOPINIRole (REQUIP) 1 MG tablet, Take 1 mg by mouth at bedtime., Disp: , Rfl: ;  sertraline (ZOLOFT) 25 MG tablet, Take 25 mg by mouth daily., Disp: , Rfl: ;  simvastatin (ZOCOR) 20 MG tablet, Take 20 mg by mouth daily., Disp: , Rfl: ;  Tiotropium Bromide Monohydrate (SPIRIVA RESPIMAT) 2.5 MCG/ACT AERS, Inhale 1 puff into the lungs daily., Disp: , Rfl:  traMADol (ULTRAM) 50 MG tablet, Take 1 tablet (50 mg total) by mouth every 6 (six) hours as needed., Disp: 15 tablet, Rfl: 0;  furosemide (LASIX) 20 MG tablet, Take  20 mg by mouth as needed for edema., Disp: , Rfl: ;  Multiple Vitamins-Minerals (MULTIVITAMIN WITH MINERALS) tablet, Take 1 tablet by mouth daily., Disp: , Rfl: :  :  Allergies  Allergen Reactions  . Epipen [Epinephrine Hcl]     Heart palpitations  . Heparin     Unsure of side affects (maybe affected platelets per pt)  . Levaquin [Levofloxacin In D5w] Other (See Comments)    Weakness  . Sulfa Antibiotics Rash  :  Family History  Problem Relation Age of Onset  . Diverticulitis Mother   . Heart attack Mother   . Ulcers Father   :  History   Social History  . Marital Status: Married    Spouse Name: N/A    Number of Children: N/A  . Years of Education: N/A   Occupational History  . Not on file.   Social History Main Topics  . Smoking status: Former Research scientist (life sciences)  . Smokeless tobacco: Not on file  . Alcohol Use: No  . Drug Use: No  . Sexual Activity: Not on file   Other Topics Concern  . Not on file   Social History Narrative  . No narrative on file  :  Pertinent items are noted in HPI.  Exam: @IPVITALS @  elderly white female in no obvious distress. The vital signs show a temperature of 97.9. Pulse 72. Blood pressure 142/60. Weight is 168 pounds. Her head neck exam shows no ocular or oral lesions. There is no scleral icterus. No lymphadenopathy noted in the neck. Thyroid nonpalpable. Her lungs are clear bilaterally. Axilla shows no lymphadenopathy. Cardiac exam regular rate and rhythm with no murmurs rubs or bruits. Abdomen shows laparotomy scars. Colostomy in the left lower quadrant. No mass. No fluid wave. No palpable liver or spleen tip. Back exam no tenderness over the spine. Extremities shows no clubbing cyanosis or edema. She has age-related changes in her joints. She is concerned. Skin exam no rashes. Neurological exam nonfocal.    Recent Labs  09/25/13 1141  WBC 7.1  HGB 12.5  HCT 37.7  PLT 236    Recent Labs  09/25/13 1141  NA 134*  K 4.1  CL 98  CO2  25  GLUCOSE 103*  BUN 13  CREATININE 0.61  CALCIUM 9.4    Blood smear review: As above  Pathology: As above     Assessment and Plan: 80 year old white female with a diagnosis of non-Hodgkin's lymphoma. She has a follicular-type lymphoma.  I think the real issue here is the fact that she is 80 years old. She has had other health issues. She really is asymptomatic with this lymphoma.  We really need to get a PET scan to see exactly what is going on. I would somehow be  surprised if she had disease elsewhere. One might think that this is a localized area of disease. Again, she is a symptomatic.  I really do not think we have to be incredibly aggressive here. She did not need a bone marrow test. I think a PET scan to be all that we need.  I talked to her and her niece. I talked to him at length. I reviewed all the labs and path reports.  I told her about lymphoma and the different kinds of lymphoma. I told her that we may not even have to do any treatment at the present time.  I think if we did have to treat her, Rituxan with Donnie Aho would be reasonable. Again, she is elderly. We have to keep in mind her marginal performance status.  I did give her a prayer blanket. She really in for this. She felt a lot better after our meeting and then when we first saw her.  Once I get the PET scan result back, then we will get her back for followup.

## 2013-09-26 NOTE — ED Provider Notes (Signed)
Medical screening examination/treatment/procedure(s) were conducted as a shared visit with non-physician practitioner(s) and myself.  I personally evaluated the patient during the encounter.   EKG Interpretation None     89yF with HA. CT done because of advanced age and no significant HA history. No concerning pathology noted. Nonfocal neuro exam. Afebrile. No nuchal rigidity. Additionally consider giant cell arteritis, but doubt. Plan symptomatic tx. Return precautions discussed.   Virgel Manifold, MD 09/26/13 [phone]

## 2013-10-01 ENCOUNTER — Encounter (INDEPENDENT_AMBULATORY_CARE_PROVIDER_SITE_OTHER): Payer: Self-pay

## 2013-10-08 ENCOUNTER — Ambulatory Visit (HOSPITAL_COMMUNITY)
Admission: RE | Admit: 2013-10-08 | Discharge: 2013-10-08 | Disposition: A | Discharge status: Home / Self Care | Payer: Medicare Other | Source: Ambulatory Visit | Attending: Hematology & Oncology | Admitting: Hematology & Oncology

## 2013-10-08 DIAGNOSIS — C8589 Other specified types of non-Hodgkin lymphoma, extranodal and solid organ sites: Secondary | ICD-10-CM | POA: Insufficient documentation

## 2013-10-08 DIAGNOSIS — C8590 Non-Hodgkin lymphoma, unspecified, unspecified site: Secondary | ICD-10-CM

## 2013-10-08 LAB — GLUCOSE, CAPILLARY: GLUCOSE-CAPILLARY: 98 mg/dL (ref 70–99)

## 2013-10-08 MED ORDER — FLUDEOXYGLUCOSE F - 18 (FDG) INJECTION
8.8000 | Freq: Once | INTRAVENOUS | Status: AC | PRN
  Administered 2013-10-08: 8.8 via INTRAVENOUS

## 2013-10-11 NOTE — Addendum Note (Signed)
Addended by: Burney Gauze R on: 10/11/2013 06:14 PM   Modules accepted: Orders

## 2013-10-14 ENCOUNTER — Telehealth: Payer: Self-pay | Admitting: Hematology & Oncology

## 2013-10-14 NOTE — Telephone Encounter (Signed)
Left message with 6-26 appointment

## 2013-10-18 ENCOUNTER — Other Ambulatory Visit: Payer: Self-pay | Admitting: Nurse Practitioner

## 2013-10-18 ENCOUNTER — Encounter: Payer: Self-pay | Admitting: Hematology & Oncology

## 2013-10-18 ENCOUNTER — Other Ambulatory Visit (HOSPITAL_BASED_OUTPATIENT_CLINIC_OR_DEPARTMENT_OTHER): Payer: Medicare Other | Admitting: Lab

## 2013-10-18 ENCOUNTER — Ambulatory Visit (HOSPITAL_BASED_OUTPATIENT_CLINIC_OR_DEPARTMENT_OTHER): Payer: Medicare Other | Admitting: Hematology & Oncology

## 2013-10-18 VITALS — BP 130/63 | HR 66 | Temp 97.7°F | Resp 16 | Wt 170.0 lb

## 2013-10-18 DIAGNOSIS — C8589 Other specified types of non-Hodgkin lymphoma, extranodal and solid organ sites: Secondary | ICD-10-CM

## 2013-10-18 DIAGNOSIS — C8590 Non-Hodgkin lymphoma, unspecified, unspecified site: Secondary | ICD-10-CM

## 2013-10-18 DIAGNOSIS — E559 Vitamin D deficiency, unspecified: Secondary | ICD-10-CM

## 2013-10-18 DIAGNOSIS — K759 Inflammatory liver disease, unspecified: Secondary | ICD-10-CM

## 2013-10-18 HISTORY — DX: Non-Hodgkin lymphoma, unspecified, unspecified site: C85.90

## 2013-10-18 LAB — CBC WITH DIFFERENTIAL (CANCER CENTER ONLY)
BASO#: 0 10*3/uL (ref 0.0–0.2)
BASO%: 0.3 % (ref 0.0–2.0)
EOS%: 3.6 % (ref 0.0–7.0)
Eosinophils Absolute: 0.2 10*3/uL (ref 0.0–0.5)
HCT: 35.3 % (ref 34.8–46.6)
HGB: 11.9 g/dL (ref 11.6–15.9)
LYMPH#: 2.5 10*3/uL (ref 0.9–3.3)
LYMPH%: 40.4 % (ref 14.0–48.0)
MCH: 27.9 pg (ref 26.0–34.0)
MCHC: 33.7 g/dL (ref 32.0–36.0)
MCV: 83 fL (ref 81–101)
MONO#: 0.8 10*3/uL (ref 0.1–0.9)
MONO%: 12.7 % (ref 0.0–13.0)
NEUT%: 43 % (ref 39.6–80.0)
NEUTROS ABS: 2.7 10*3/uL (ref 1.5–6.5)
PLATELETS: 223 10*3/uL (ref 145–400)
RBC: 4.26 10*6/uL (ref 3.70–5.32)
RDW: 16.3 % — AB (ref 11.1–15.7)
WBC: 6.2 10*3/uL (ref 3.9–10.0)

## 2013-10-18 LAB — CMP (CANCER CENTER ONLY)
ALT(SGPT): 16 U/L (ref 10–47)
AST: 22 U/L (ref 11–38)
Albumin: 3.3 g/dL (ref 3.3–5.5)
Alkaline Phosphatase: 70 U/L (ref 26–84)
BUN: 9 mg/dL (ref 7–22)
CALCIUM: 9.4 mg/dL (ref 8.0–10.3)
CHLORIDE: 100 meq/L (ref 98–108)
CO2: 27 meq/L (ref 18–33)
CREATININE: 0.6 mg/dL (ref 0.6–1.2)
GLUCOSE: 101 mg/dL (ref 73–118)
Potassium: 3.9 mEq/L (ref 3.3–4.7)
Sodium: 135 mEq/L (ref 128–145)
Total Bilirubin: 0.6 mg/dl (ref 0.20–1.60)
Total Protein: 6.6 g/dL (ref 6.4–8.1)

## 2013-10-18 LAB — HEPATITIS PANEL, ACUTE
HCV Ab: NEGATIVE
HEP B S AG: NEGATIVE
Hep A IgM: NONREACTIVE
Hep B C IgM: NONREACTIVE

## 2013-10-18 LAB — LACTATE DEHYDROGENASE: LDH: 141 U/L (ref 94–250)

## 2013-10-18 NOTE — Patient Instructions (Signed)
Please apply the Emla (numbing cream) 1 hr prior to your appointment time. You will place a large glob of cream over the raised port area on your chest. DO NOT RUB THE CREAM IN. Then place a piece of plastic wrap over the cream to keep from messing up your clothing top. The nurses will remove the plastic wrap and cream when you get to your appointment. If you have any questions please call us at [phone].   Implanted [facility] Guide An implanted port is a type of central line that is placed under the skin. Central lines are used to provide IV access when treatment or nutrition needs to be given through a person's veins. Implanted ports are used for long-term IV access. An implanted port may be placed because:   You need IV medicine that would be irritating to the small veins in your hands or arms.   You need long-term IV medicines, such as antibiotics.   You need IV nutrition for a long period.   You need frequent blood draws for lab tests.   You need dialysis.  Implanted ports are usually placed in the chest area, but they can also be placed in the upper arm, the abdomen, or the leg. An implanted port has two main parts:   Reservoir. The reservoir is round and will appear as a small, raised area under your skin. The reservoir is the part where a needle is inserted to give medicines or draw blood.   Catheter. The catheter is a thin, flexible tube that extends from the reservoir. The catheter is placed into a large vein. Medicine that is inserted into the reservoir goes into the catheter and then into the vein.  HOW WILL I CARE FOR MY INCISION SITE? Do not get the incision site wet. Bathe or shower as directed by your health care provider.  HOW IS MY PORT ACCESSED? Special steps must be taken to access the port:   Before the port is accessed, a numbing cream can be placed on the skin. This helps numb the skin over the port site.   Your health care provider uses a sterile  technique to access the port.  Your health care provider must put on a mask and sterile gloves.  The skin over your port is cleaned carefully with an antiseptic and allowed to dry.  The port is gently pinched between sterile gloves, and a needle is inserted into the port.  Only "non-coring" port needles should be used to access the port. Once the port is accessed, a blood return should be checked. This helps ensure that the port is in the vein and is not clogged.   If your port needs to remain accessed for a constant infusion, a clear (transparent) bandage will be placed over the needle site. The bandage and needle will need to be changed every week, or as directed by your health care provider.   Keep the bandage covering the needle clean and dry. Do not get it wet. Follow your health care provider's instructions on how to take a shower or bath while the port is accessed.   If your port does not need to stay accessed, no bandage is needed over the port.  WHAT IS FLUSHING? Flushing helps keep the port from getting clogged. Follow your health care provider's instructions on how and when to flush the port. Ports are usually flushed with saline solution or a medicine called heparin. The need for flushing will depend on how the  port is used.   If the port is used for intermittent medicines or blood draws, the port will need to be flushed:   After medicines have been given.   After blood has been drawn.   As part of routine maintenance.   If a constant infusion is running, the port may not need to be flushed.  HOW LONG WILL MY PORT STAY IMPLANTED? The port can stay in for as long as your health care provider thinks it is needed. When it is time for the port to come out, surgery will be done to remove it. The procedure is similar to the one performed when the port was put in.  WHEN SHOULD I SEEK IMMEDIATE MEDICAL CARE? When you have an implanted port, you should seek immediate medical  care if:   You notice a bad smell coming from the incision site.   You have swelling, redness, or drainage at the incision site.   You have more swelling or pain at the port site or the surrounding area.   You have a fever that is not controlled with medicine. Document Released: 04/11/2005 Document Revised: 01/30/2013 Document Reviewed: 12/17/2012 [facility] Patient Information 2015 [place], [place]. This information is not intended to replace advice given to you by your health care provider. Make sure you discuss any questions you have with your health care provider.

## 2013-10-20 NOTE — Progress Notes (Signed)
Hematology and Oncology Follow Up Visit  Makayla Fisher [id number] 11/06/1922 80 y.o. 10/20/2013   Principle Diagnosis:   Follicular large cell non-Hodgkin's lymphoma  Current Therapy:    Observation     Interim History:  Makayla Fisher is back for a second office visit her with first saw her back in early June. At that point in time, she had a biopsy done of a mesenteric mass. This measured 3.8 x 2.3 cm. The biopsy showed a follicular lymphoma. They felt that there may be a large cell component to this.  We will ahead and did a PET scan on her. This showed mesenteric and retroperitoneal lymphadenopathy. She has a right inguinal lymphadenopathy. There was a T8 compression fracture felt to be non-malignant.  We did do hepatitis studies on her. These were all negative. Her LDH was 141.  She's been in good shape. She is eating well. Her overall performance status is  ECOG 1.  Health that we probably could treat her and help her. I felt that using Treanda/Rituxan would be reasonable given her age.  I talked to her and her niece at length. I spent about 45 minutes with him. I went over the PET scan. I detailed the chemotherapy. I told her that chemotherapy should be effective and over 80% of patients.  I don't think we need to do a bone marrow biopsy on her. This will not change management.  I went over the fact that she could have a reaction to Rituxan while she is getting Rituxan but then after that, there should be no problems.  I went over the issues with nausea and vomiting. However the issues of fatigue. Her overall, I really think she would do okay with this.  Told her that we probably are not going to cure this but we can at least try to get her into remission which should be long lasting. Medications: Current outpatient prescriptions:acetaminophen (TYLENOL) 500 MG tablet, Take 2 tablets (1,000 mg total) by mouth every 6 (six) hours as needed for mild pain., Disp: 30 tablet,  Rfl: 0;  amLODipine (NORVASC) 5 MG tablet, Take 5 mg by mouth daily., Disp: , Rfl: ;  aspirin 81 MG tablet, Take 81 mg by mouth every other day. , Disp: , Rfl: ;  carvedilol (COREG) 3.125 MG tablet, Take 3.125 mg by mouth daily. , Disp: , Rfl:  cycloSPORINE (RESTASIS) 0.05 % ophthalmic emulsion, Place 1 drop into both eyes 2 (two) times daily. , Disp: , Rfl: ;  docusate sodium (COLACE) 100 MG capsule, Take 1 capsule (100 mg total) by mouth every 12 (twelve) hours., Disp: 60 capsule, Rfl: 0;  ezetimibe (ZETIA) 10 MG tablet, Take 10 mg by mouth daily. , Disp: , Rfl: ;  furosemide (LASIX) 20 MG tablet, Take 20 mg by mouth daily. , Disp: , Rfl:  levothyroxine (SYNTHROID, LEVOTHROID) 75 MCG tablet, Take 75 mcg by mouth daily before breakfast., Disp: , Rfl: ;  lisinopril (PRINIVIL,ZESTRIL) 20 MG tablet, Take 20 mg by mouth 2 (two) times daily., Disp: , Rfl: ;  Multiple Vitamins-Minerals (MULTIVITAMIN WITH MINERALS) tablet, Take 1 tablet by mouth daily., Disp: , Rfl: ;  pantoprazole (PROTONIX) 40 MG tablet, Take 40 mg by mouth daily., Disp: , Rfl:  polyethylene glycol (MIRALAX / GLYCOLAX) packet, Take 17 g by mouth daily., Disp: 14 each, Rfl: 0;  rOPINIRole (REQUIP) 1 MG tablet, Take 1 mg by mouth at bedtime., Disp: , Rfl: ;  sertraline (ZOLOFT) 25 MG tablet, Take 25 mg  by mouth daily., Disp: , Rfl: ;  simvastatin (ZOCOR) 20 MG tablet, Take 20 mg by mouth daily., Disp: , Rfl:  Tiotropium Bromide Monohydrate (SPIRIVA RESPIMAT) 2.5 MCG/ACT AERS, Inhale 1 puff into the lungs daily., Disp: , Rfl: ;  traMADol (ULTRAM) 50 MG tablet, Take 1 tablet (50 mg total) by mouth every 6 (six) hours as needed., Disp: 15 tablet, Rfl: 0;  furosemide (LASIX) 20 MG tablet, Take 20 mg by mouth as needed for edema., Disp: , Rfl:   Allergies:  Allergies  Allergen Reactions  . Epipen [Epinephrine Hcl]     Heart palpitations  . Heparin     Unsure of side affects (maybe affected platelets per pt)  . Levaquin [Levofloxacin In D5w] Other  (See Comments)    Weakness  . Sulfa Antibiotics Rash    Past Medical History, Surgical history, Social history, and Family History were reviewed and updated.  Review of Systems: As above  Physical Exam:  weight is 170 lb (77.111 kg). Her oral temperature is 97.7 F (36.5 C). Her blood pressure is 130/63 and her pulse is 66. Her respiration is 16.   Totally a well-nourished white female. There's no lymph nodes on exam. She has no oral or ocular lesion. Lungs are clear. Cardiac exam regular in rhythm. Abdomen soft. She is good bowel sounds. There is no fluid within is no palpable liver or spleen tip. She went to the laparotomy scar this does have a colostomy in the left lower quadrant. Back exam no tenderness over the spine. Extremities shows no clubbing cyanosis or edema. Neurological exam is nonfocal. Skin exam shows no rashes.  Lab Results  Component Value Date   WBC 6.2 10/18/2013   HGB 11.9 10/18/2013   HCT 35.3 10/18/2013   MCV 83 10/18/2013   PLT 223 10/18/2013     Chemistry      Component Value Date/Time   NA 135 10/18/2013 1221   NA 134* 09/25/2013 1141   K 3.9 10/18/2013 1221   K 4.1 09/25/2013 1141   CL 100 10/18/2013 1221   CL 98 09/25/2013 1141   CO2 27 10/18/2013 1221   CO2 25 09/25/2013 1141   BUN 9 10/18/2013 1221   BUN 13 09/25/2013 1141   CREATININE 0.6 10/18/2013 1221   CREATININE 0.61 09/25/2013 1141      Component Value Date/Time   CALCIUM 9.4 10/18/2013 1221   CALCIUM 9.4 09/25/2013 1141   ALKPHOS 70 10/18/2013 1221   ALKPHOS 90 09/25/2013 1141   AST 22 10/18/2013 1221   AST 15 09/25/2013 1141   ALT 16 10/18/2013 1221   ALT <8 09/25/2013 1141   BILITOT 0.60 10/18/2013 1221   BILITOT 0.4 09/25/2013 1141         Impression and Plan: [name] is an 80 year old white female. She has a follicular large cell lymphoma. I think this probably would be the most likely diagnosis. Her that we know that this is a lymphoma. His CD 10 positive which would indicate follicular nature.  There is a large cell component on the biopsy.  She will need to have a Port-A-Cath put in. She does not have good peripheral access. I told her about this. We will have radiology do this.  We will start treatment on her probably in 2 weeks. We certainly can wait until after the July 4 holiday before have to start.  Again, I went over the side effects with her. I explained how the chemotherapy is given. She and  her niece told understand and will proceed.  We will give her dose reduced Treanda because of her age. I gave with this dose reduction, she will do okay.  A probable have her come back 2 weeks after her treatment so that we can see how her blood counts are. Probable plan to see her back myself in one month after her first treatment.  After her second treatment, we will go ahead and do another PET scan.   [name], MD 6/28/20151:56 PM

## 2013-10-21 ENCOUNTER — Ambulatory Visit: Payer: Medicare Other | Admitting: Family

## 2013-10-21 ENCOUNTER — Other Ambulatory Visit: Payer: Medicare Other | Admitting: Lab

## 2013-10-21 ENCOUNTER — Other Ambulatory Visit: Payer: Self-pay | Admitting: Nurse Practitioner

## 2013-10-21 LAB — BETA 2 MICROGLOBULIN, SERUM: BETA 2 MICROGLOBULIN: 3.34 mg/L — AB (ref <=2.51)

## 2013-10-21 MED ORDER — LIDOCAINE-PRILOCAINE 2.5-2.5 % EX CREA: 1.0000 "application " | TOPICAL_CREAM | CUTANEOUS | Status: DC | PRN

## 2013-10-21 NOTE — Telephone Encounter (Signed)
Emla script faxed to [facility] per Pt's request [phone]). Spoke with Care Coordinator to verbalized receipt, she stated they would fax to their facilities pharmacy.

## 2013-10-22 ENCOUNTER — Other Ambulatory Visit: Payer: Self-pay | Admitting: *Deleted

## 2013-10-22 DIAGNOSIS — C8590 Non-Hodgkin lymphoma, unspecified, unspecified site: Secondary | ICD-10-CM

## 2013-10-22 MED ORDER — MULTI-VITAMIN/MINERALS PO TABS: 1.0000 | ORAL_TABLET | Freq: Every day | ORAL | Status: DC

## 2013-10-23 ENCOUNTER — Other Ambulatory Visit: Payer: Self-pay | Admitting: Radiology

## 2013-10-23 ENCOUNTER — Encounter (HOSPITAL_COMMUNITY): Payer: Self-pay | Admitting: Pharmacy Technician

## 2013-10-23 ENCOUNTER — Ambulatory Visit: Payer: Medicare Other

## 2013-10-28 ENCOUNTER — Other Ambulatory Visit: Payer: Self-pay | Admitting: Nurse Practitioner

## 2013-10-28 ENCOUNTER — Ambulatory Visit (HOSPITAL_COMMUNITY)
Admission: RE | Admit: 2013-10-28 | Discharge: 2013-10-28 | Disposition: A | Discharge status: Home / Self Care | Payer: Medicare Other | Source: Ambulatory Visit | Attending: Hematology & Oncology | Admitting: Hematology & Oncology

## 2013-10-28 ENCOUNTER — Other Ambulatory Visit: Payer: Medicare Other

## 2013-10-28 ENCOUNTER — Other Ambulatory Visit: Payer: Self-pay | Admitting: Hematology & Oncology

## 2013-10-28 ENCOUNTER — Encounter (HOSPITAL_COMMUNITY): Payer: Self-pay

## 2013-10-28 VITALS — BP 131/60 | HR 68 | Temp 97.8°F | Resp 18

## 2013-10-28 DIAGNOSIS — Z79899 Other long term (current) drug therapy: Secondary | ICD-10-CM | POA: Insufficient documentation

## 2013-10-28 DIAGNOSIS — Z7982 Long term (current) use of aspirin: Secondary | ICD-10-CM | POA: Insufficient documentation

## 2013-10-28 DIAGNOSIS — C8589 Other specified types of non-Hodgkin lymphoma, extranodal and solid organ sites: Secondary | ICD-10-CM | POA: Insufficient documentation

## 2013-10-28 DIAGNOSIS — C8590 Non-Hodgkin lymphoma, unspecified, unspecified site: Secondary | ICD-10-CM

## 2013-10-28 LAB — CBC WITH DIFFERENTIAL/PLATELET
BASOS PCT: 1 % (ref 0–1)
Basophils Absolute: 0 10*3/uL (ref 0.0–0.1)
EOS ABS: 0.2 10*3/uL (ref 0.0–0.7)
Eosinophils Relative: 3 % (ref 0–5)
HCT: 35.8 % — ABNORMAL LOW (ref 36.0–46.0)
Hemoglobin: 11.9 g/dL — ABNORMAL LOW (ref 12.0–15.0)
Lymphocytes Relative: 45 % (ref 12–46)
Lymphs Abs: 3.1 10*3/uL (ref 0.7–4.0)
MCH: 27.2 pg (ref 26.0–34.0)
MCHC: 33.2 g/dL (ref 30.0–36.0)
MCV: 81.7 fL (ref 78.0–100.0)
MONOS PCT: 9 % (ref 3–12)
Monocytes Absolute: 0.6 10*3/uL (ref 0.1–1.0)
NEUTROS PCT: 42 % — AB (ref 43–77)
Neutro Abs: 2.8 10*3/uL (ref 1.7–7.7)
PLATELETS: 221 10*3/uL (ref 150–400)
RBC: 4.38 MIL/uL (ref 3.87–5.11)
RDW: 16.5 % — ABNORMAL HIGH (ref 11.5–15.5)
WBC: 6.8 10*3/uL (ref 4.0–10.5)

## 2013-10-28 LAB — APTT: APTT: 30 s (ref 24–37)

## 2013-10-28 LAB — PROTIME-INR
INR: 1.02 (ref 0.00–1.49)
PROTHROMBIN TIME: 13.4 s (ref 11.6–15.2)

## 2013-10-28 MED ORDER — MIDAZOLAM HCL 2 MG/2ML IJ SOLN
INTRAMUSCULAR | Status: AC | PRN
  Administered 2013-10-28 (×2): 1 mg via INTRAVENOUS

## 2013-10-28 MED ORDER — SODIUM CHLORIDE 0.9 % IV SOLN
INTRAVENOUS | Status: DC
  Administered 2013-10-28: 13:00:00 via INTRAVENOUS

## 2013-10-28 MED ORDER — VANCOMYCIN HCL IN DEXTROSE 1-5 GM/200ML-% IV SOLN
1000.0000 mg | INTRAVENOUS | Status: AC
  Administered 2013-10-28: 1000 mg via INTRAVENOUS
  Filled 2013-10-28: qty 200

## 2013-10-28 MED ORDER — ANTICOAGULANT SODIUM CITRATE 4% (200MG/5ML) IV SOLN
5.0000 mL | Freq: Once | Status: AC
  Administered 2013-10-28: 5 mL via INTRAVENOUS
  Filled 2013-10-28: qty 250

## 2013-10-28 MED ORDER — CEFAZOLIN SODIUM-DEXTROSE 2-3 GM-% IV SOLR
INTRAVENOUS | Status: AC
  Filled 2013-10-28: qty 50

## 2013-10-28 MED ORDER — MIDAZOLAM HCL 2 MG/2ML IJ SOLN
INTRAMUSCULAR | Status: AC
  Filled 2013-10-28: qty 2

## 2013-10-28 MED ORDER — FENTANYL CITRATE 0.05 MG/ML IJ SOLN
INTRAMUSCULAR | Status: AC
  Filled 2013-10-28: qty 2

## 2013-10-28 MED ORDER — FENTANYL CITRATE 0.05 MG/ML IJ SOLN
INTRAMUSCULAR | Status: AC | PRN
  Administered 2013-10-28: 50 ug via INTRAVENOUS

## 2013-10-28 MED ORDER — LIDOCAINE-EPINEPHRINE (PF) 2 %-1:200000 IJ SOLN
INTRAMUSCULAR | Status: AC
  Filled 2013-10-28: qty 20

## 2013-10-28 MED ORDER — LIDOCAINE HCL 1 % IJ SOLN
INTRAMUSCULAR | Status: AC
  Filled 2013-10-28: qty 20

## 2013-10-28 MED ORDER — VITAMIN D 1000 UNITS PO TABS: 2000.0000 [IU] | ORAL_TABLET | Freq: Every day | ORAL | Status: DC

## 2013-10-28 NOTE — Progress Notes (Addendum)
Pt c/o severe itching to R lower arm.  Pt scratching area frequently.  Lower arm shows intact skin with red splotchy areas.  Rowe Robert PA notified.  Coolidge Breeze, RN 10/28/2013  Redness has receded.  Arm appears "back to normal" per pt. Rowe Robert at bedside and aware. Coolidge Breeze, RN 10/28/2013

## 2013-10-28 NOTE — Discharge Instructions (Signed)

## 2013-10-28 NOTE — Procedures (Signed)
SUCCESSFUL RT IJ POWER PORT No comp Stable Tip svc/ra Ready for use

## 2013-10-28 NOTE — H&P (Signed)
Makayla Fisher is an 80 y.o. female.   Chief Complaint: "I'm here for a port a cath" HPI: Patient with recently diagnosed follicular large cell  lymphoma(NHL) presents today for port a cath placement for chemotherapy.  Past Medical History  Diagnosis Date  . Heart attack   . NHL (non-Hodgkin's lymphoma) 10/18/2013    Past Surgical History  Procedure Laterality Date  . Colon surgery  2014    Colostomy  . Cardiac surgery  2009  . Coronary angioplasty with stent placement  2009  . C section  1947, 1963  . Cholecystectomy  1973  . Esophagogastroduodenoscopy  2013    Family History  Problem Relation Age of Onset  . Diverticulitis Mother   . Heart attack Mother   . Ulcers Father    Social History:  reports that she has quit smoking. She does not have any smokeless tobacco history on file. She reports that she does not drink alcohol or use illicit drugs.  Allergies:  Allergies  Allergen Reactions  . Epipen [Epinephrine Hcl]     Heart palpitations  . Heparin     Unsure of side affects (maybe affected platelets per pt)  . Levaquin [Levofloxacin In D5w] Other (See Comments)    Weakness  . Sulfa Antibiotics Rash    Current outpatient prescriptions:acetaminophen (TYLENOL) 500 MG tablet, Take 500 mg by mouth every 6 (six) hours as needed for mild pain., Disp: , Rfl: ;  amLODipine (NORVASC) 5 MG tablet, Take 5 mg by mouth every morning. , Disp: , Rfl: ;  aspirin 81 MG tablet, Take 81 mg by mouth every other day. , Disp: , Rfl: ;  carvedilol (COREG) 3.125 MG tablet, Take 3.125 mg by mouth daily. , Disp: , Rfl:  cholecalciferol (VITAMIN D) 1000 UNITS tablet, Take 2 tablets (2,000 Units total) by mouth daily., Disp: 60 tablet, Rfl: 6;  cycloSPORINE (RESTASIS) 0.05 % ophthalmic emulsion, Place 1 drop into both eyes daily. , Disp: , Rfl: ;  docusate sodium (COLACE) 100 MG capsule, Take 100 mg by mouth 2 (two) times daily., Disp: , Rfl: ;  ezetimibe (ZETIA) 10 MG tablet, Take 10 mg by mouth  every morning. , Disp: , Rfl:  furosemide (LASIX) 20 MG tablet, Take 20 mg by mouth every morning. , Disp: , Rfl: ;  levothyroxine (SYNTHROID, LEVOTHROID) 75 MCG tablet, Take 75 mcg by mouth daily before breakfast., Disp: , Rfl: ;  lisinopril (PRINIVIL,ZESTRIL) 20 MG tablet, Take 20 mg by mouth 2 (two) times daily., Disp: , Rfl: ;  Multiple Vitamin (MULTIVITAMIN WITH MINERALS) TABS tablet, Take 1 tablet by mouth daily., Disp: , Rfl:  pantoprazole (PROTONIX) 40 MG tablet, Take 40 mg by mouth daily., Disp: , Rfl: ;  rOPINIRole (REQUIP) 1 MG tablet, Take 1 mg by mouth at bedtime., Disp: , Rfl: ;  sertraline (ZOLOFT) 25 MG tablet, Take 25 mg by mouth every morning. , Disp: , Rfl: ;  simvastatin (ZOCOR) 20 MG tablet, Take 20 mg by mouth every evening. , Disp: , Rfl:  Tiotropium Bromide Monohydrate (SPIRIVA RESPIMAT) 2.5 MCG/ACT AERS, Inhale 1 puff into the lungs daily., Disp: , Rfl: ;  traMADol (ULTRAM) 50 MG tablet, Take 50 mg by mouth every 6 (six) hours as needed for moderate pain., Disp: , Rfl:  Current facility-administered medications:0.9 %  sodium chloride infusion, , Intravenous, Continuous, D Kevin Christofer Shen, PA-C, Last Rate: 50 mL/hr at 10/28/13 1300;  vancomycin (VANCOCIN) IVPB 1000 mg/200 mL premix, 1,000 mg, Intravenous, On Call, D Rowe Robert,  PA-C   Results for orders placed during the hospital encounter of 10/28/13 (from the past 48 hour(s))  CBC WITH DIFFERENTIAL     Status: Abnormal   Collection Time    10/28/13  1:00 PM      Result Value Ref Range   WBC 6.8  4.0 - 10.5 K/uL   RBC 4.38  3.87 - 5.11 MIL/uL   Hemoglobin 11.9 (*) 12.0 - 15.0 g/dL   HCT 35.8 (*) 36.0 - 46.0 %   MCV 81.7  78.0 - 100.0 fL   MCH 27.2  26.0 - 34.0 pg   MCHC 33.2  30.0 - 36.0 g/dL   RDW 16.5 (*) 11.5 - 15.5 %   Platelets 221  150 - 400 K/uL   Neutrophils Relative % 42 (*) 43 - 77 %   Neutro Abs 2.8  1.7 - 7.7 K/uL   Lymphocytes Relative 45  12 - 46 %   Lymphs Abs 3.1  0.7 - 4.0 K/uL   Monocytes Relative 9   3 - 12 %   Monocytes Absolute 0.6  0.1 - 1.0 K/uL   Eosinophils Relative 3  0 - 5 %   Eosinophils Absolute 0.2  0.0 - 0.7 K/uL   Basophils Relative 1  0 - 1 %   Basophils Absolute 0.0  0.0 - 0.1 K/uL   No results found.  Review of Systems  Constitutional: Negative for fever and chills.  Respiratory: Negative for cough, hemoptysis and shortness of breath.   Cardiovascular:       Occ rt sided pleuritic discomfort  Gastrointestinal: Negative for nausea, vomiting and blood in stool.       Mild soreness at ostomy site LLQ  Genitourinary: Negative for hematuria.  Musculoskeletal: Positive for back pain.  Neurological: Negative for headaches.  Endo/Heme/Allergies: Does not bruise/bleed easily.  Psychiatric/Behavioral: The patient is nervous/anxious.     Blood pressure 163/75, pulse 74, temperature 96.8 F (36 C), temperature source Oral, resp. rate 16, SpO2 98.00%. Physical Exam  Constitutional: She is oriented to person, place, and time. She appears well-developed and well-nourished.  Cardiovascular: Normal rate and regular rhythm.   Respiratory: Effort normal and breath sounds normal.  GI: Soft. Bowel sounds are normal.  Obese; Intact LLQ colostomy, stoma with sl increased erythema  Musculoskeletal: Normal range of motion. She exhibits no edema.  Neurological: She is alert and oriented to person, place, and time.     Assessment/Plan Patient with recently diagnosed follicular large cell  lymphoma(NHL) presents today for port a cath placement for chemotherapy. Details/risks of procedure d/w pt/niece with their understanding and consent.  Makayla Fisher,D KEVIN 10/28/2013, 1:14 PM

## 2013-10-31 ENCOUNTER — Other Ambulatory Visit: Payer: Self-pay

## 2013-10-31 ENCOUNTER — Ambulatory Visit (HOSPITAL_BASED_OUTPATIENT_CLINIC_OR_DEPARTMENT_OTHER): Payer: Medicare Other

## 2013-10-31 VITALS — BP 148/66 | HR 94 | Temp 96.8°F | Resp 20

## 2013-10-31 DIAGNOSIS — C8590 Non-Hodgkin lymphoma, unspecified, unspecified site: Secondary | ICD-10-CM

## 2013-10-31 DIAGNOSIS — Z5111 Encounter for antineoplastic chemotherapy: Secondary | ICD-10-CM

## 2013-10-31 DIAGNOSIS — Z5112 Encounter for antineoplastic immunotherapy: Secondary | ICD-10-CM

## 2013-10-31 DIAGNOSIS — C8589 Other specified types of non-Hodgkin lymphoma, extranodal and solid organ sites: Secondary | ICD-10-CM

## 2013-10-31 MED ORDER — DEXAMETHASONE SODIUM PHOSPHATE 10 MG/ML IJ SOLN
10.0000 mg | Freq: Once | INTRAMUSCULAR | Status: AC
  Administered 2013-10-31: 10 mg via INTRAVENOUS

## 2013-10-31 MED ORDER — ONDANSETRON 8 MG/50ML IVPB (CHCC)
8.0000 mg | Freq: Once | INTRAVENOUS | Status: AC
  Administered 2013-10-31: 8 mg via INTRAVENOUS

## 2013-10-31 MED ORDER — SODIUM CHLORIDE 0.9 % IJ SOLN
10.0000 mL | INTRAMUSCULAR | Status: DC | PRN
  Administered 2013-10-31: 10 mL
  Filled 2013-10-31: qty 10

## 2013-10-31 MED ORDER — PROCHLORPERAZINE MALEATE 10 MG PO TABS: 5.0000 mg | ORAL_TABLET | Freq: Four times a day (QID) | ORAL | Status: DC | PRN

## 2013-10-31 MED ORDER — SODIUM CHLORIDE 0.9 % IV SOLN
375.0000 mg/m2 | Freq: Once | INTRAVENOUS | Status: AC
  Administered 2013-10-31: 700 mg via INTRAVENOUS
  Filled 2013-10-31: qty 70

## 2013-10-31 MED ORDER — HEPARIN SOD (PORK) LOCK FLUSH 100 UNIT/ML IV SOLN
500.0000 [IU] | Freq: Once | INTRAVENOUS | Status: AC | PRN
  Administered 2013-10-31: 500 [IU]
  Filled 2013-10-31: qty 5

## 2013-10-31 MED ORDER — DIPHENHYDRAMINE HCL 25 MG PO CAPS
ORAL_CAPSULE | ORAL | Status: AC
  Filled 2013-10-31: qty 2

## 2013-10-31 MED ORDER — ACETAMINOPHEN 325 MG PO TABS
650.0000 mg | ORAL_TABLET | Freq: Once | ORAL | Status: AC
  Administered 2013-10-31: 650 mg via ORAL

## 2013-10-31 MED ORDER — DIPHENHYDRAMINE HCL 25 MG PO CAPS
50.0000 mg | ORAL_CAPSULE | Freq: Once | ORAL | Status: AC
Start: 2013-10-31 — End: 2013-10-31
  Administered 2013-10-31: 50 mg via ORAL

## 2013-10-31 MED ORDER — DEXAMETHASONE SODIUM PHOSPHATE 10 MG/ML IJ SOLN
INTRAMUSCULAR | Status: AC
  Filled 2013-10-31: qty 1

## 2013-10-31 MED ORDER — SODIUM CHLORIDE 0.9 % IV SOLN
Freq: Once | INTRAVENOUS | Status: AC
  Administered 2013-10-31: 09:00:00 via INTRAVENOUS

## 2013-10-31 MED ORDER — ONDANSETRON HCL 8 MG PO TABS: 8.0000 mg | ORAL_TABLET | Freq: Two times a day (BID) | ORAL | Status: DC

## 2013-10-31 MED ORDER — SODIUM CHLORIDE 0.9 % IV SOLN
72.0000 mg/m2 | Freq: Once | INTRAVENOUS | Status: AC
  Administered 2013-10-31: 135 mg via INTRAVENOUS
  Filled 2013-10-31: qty 1.5

## 2013-10-31 MED ORDER — ACETAMINOPHEN 325 MG PO TABS
ORAL_TABLET | ORAL | Status: AC
  Filled 2013-10-31: qty 2

## 2013-10-31 NOTE — Patient Instructions (Signed)
[facility] Discharge Instructions for Patients Receiving Chemotherapy  Today you received the following chemotherapy agents Rituxan and Treanda  To help prevent nausea and vomiting after your treatment, we encourage you to take your nausea medication:     If you develop nausea and vomiting that is not controlled by your nausea medication, call the clinic.   BELOW ARE SYMPTOMS THAT SHOULD BE REPORTED IMMEDIATELY:  *FEVER GREATER THAN 100.5 F  *CHILLS WITH OR WITHOUT FEVER  NAUSEA AND VOMITING THAT IS NOT CONTROLLED WITH YOUR NAUSEA MEDICATION  *UNUSUAL SHORTNESS OF BREATH  *UNUSUAL BRUISING OR BLEEDING  TENDERNESS IN MOUTH AND THROAT WITH OR WITHOUT PRESENCE OF ULCERS  *URINARY PROBLEMS  *BOWEL PROBLEMS  UNUSUAL RASH Items with * indicate a potential emergency and should be followed up as soon as possible.  Feel free to call the clinic you have any questions or concerns. The clinic phone number is [phone].   Rituximab injection What is this medicine? RITUXIMAB (ri TUX i mab) is a monoclonal antibody. This medicine changes the way the body's immune system works. It is used commonly to treat non-Hodgkin's lymphoma and other conditions. In cancer cells, this drug targets a specific protein within cancer cells and stops the cancer cells from growing. It is also used to treat rhuematoid arthritis (RA). In RA, this medicine slow the inflammatory process and help reduce joint pain and swelling. This medicine is often used with other cancer or arthritis medications. This medicine may be used for other purposes; ask your health care provider or pharmacist if you have questions. COMMON BRAND NAME(S): Rituxan What should I tell my health care provider before I take this medicine? They need to know if you have any of these conditions: -blood disorders -heart disease -history of hepatitis B -infection (especially a virus infection such as chickenpox, cold sores, or  herpes) -irregular heartbeat -kidney disease -lung or breathing disease, like asthma -lupus -an unusual or allergic reaction to rituximab, mouse proteins, other medicines, foods, dyes, or preservatives -pregnant or trying to get pregnant -breast-feeding How should I use this medicine? This medicine is for infusion into a vein. It is administered in a hospital or clinic by a specially trained health care professional. A special MedGuide will be given to you by the pharmacist with each prescription and refill. Be sure to read this information carefully each time. Talk to your pediatrician regarding the use of this medicine in children. This medicine is not approved for use in children. Overdosage: If you think you have taken too much of this medicine contact a poison control center or emergency room at once. NOTE: This medicine is only for you. Do not share this medicine with others. What if I miss a dose? It is important not to miss a dose. Call your doctor or health care professional if you are unable to keep an appointment. What may interact with this medicine? -cisplatin -medicines for blood pressure -some other medicines for arthritis -vaccines This list may not describe all possible interactions. Give your health care provider a list of all the medicines, herbs, non-prescription drugs, or dietary supplements you use. Also tell them if you smoke, drink alcohol, or use illegal drugs. Some items may interact with your medicine. What should I watch for while using this medicine? Report any side effects that you notice during your treatment right away, such as changes in your breathing, fever, chills, dizziness or lightheadedness. These effects are more common with the first dose. Visit your  prescriber or health care professional for checks on your progress. You will need to have regular blood work. Report any other side effects. The side effects of this medicine can continue after you finish  your treatment. Continue your course of treatment even though you feel ill unless your doctor tells you to stop. Call your doctor or health care professional for advice if you get a fever, chills or sore throat, or other symptoms of a cold or flu. Do not treat yourself. This drug decreases your body's ability to fight infections. Try to avoid being around people who are sick. This medicine may increase your risk to bruise or bleed. Call your doctor or health care professional if you notice any unusual bleeding. Be careful brushing and flossing your teeth or using a toothpick because you may get an infection or bleed more easily. If you have any dental work done, tell your dentist you are receiving this medicine. Avoid taking products that contain aspirin, acetaminophen, ibuprofen, naproxen, or ketoprofen unless instructed by your doctor. These medicines may hide a fever. Do not become pregnant while taking this medicine. Women should inform their doctor if they wish to become pregnant or think they might be pregnant. There is a potential for serious side effects to an unborn child. Talk to your health care professional or pharmacist for more information. Do not breast-feed an infant while taking this medicine. What side effects may I notice from receiving this medicine? Side effects that you should report to your doctor or health care professional as soon as possible: -allergic reactions like skin rash, itching or hives, swelling of the face, lips, or tongue -low blood counts - this medicine may decrease the number of white blood cells, red blood cells and platelets. You may be at increased risk for infections and bleeding. -signs of infection - fever or chills, cough, sore throat, pain or difficulty passing urine -signs of decreased platelets or bleeding - bruising, pinpoint red spots on the skin, black, tarry stools, blood in the urine -signs of decreased red blood cells - unusually weak or tired,  fainting spells, lightheadedness -breathing problems -confused, not responsive -chest pain -fast, irregular heartbeat -feeling faint or lightheaded, falls -mouth sores -redness, blistering, peeling or loosening of the skin, including inside the mouth -stomach pain -swelling of the ankles, feet, or hands -trouble passing urine or change in the amount of urine Side effects that usually do not require medical attention (report to your doctor or other health care professional if they continue or are bothersome): -anxiety -headache -loss of appetite -muscle aches -nausea -night sweats This list may not describe all possible side effects. Call your doctor for medical advice about side effects. You may report side effects to FDA at 1-800-FDA-1088. Where should I keep my medicine? This drug is given in a hospital or clinic and will not be stored at home. NOTE: This sheet is a summary. It may not cover all possible information. If you have questions about this medicine, talk to your doctor, pharmacist, or health care provider.  2015, Elsevier/Gold Standard. (2007-12-10 14:04:59) Bendamustine Injection What is this medicine? BENDAMUSTINE (BEN da MUS teen) is a chemotherapy drug. It is used to treat chronic lymphocytic leukemia and non-Hodgkin lymphoma. This medicine may be used for other purposes; ask your health care provider or pharmacist if you have questions. COMMON BRAND NAME(S): Treanda What should I tell my health care provider before I take this medicine? They need to know if you have any of these  conditions: -kidney disease -liver disease -an unusual or allergic reaction to bendamustine, mannitol, other medicines, foods, dyes, or preservatives -pregnant or trying to get pregnant -breast-feeding How should I use this medicine? This medicine is for infusion into a vein. It is given by a health care professional in a hospital or clinic setting. Talk to your pediatrician regarding  the use of this medicine in children. Special care may be needed. Overdosage: If you think you have taken too much of this medicine contact a poison control center or emergency room at once. NOTE: This medicine is only for you. Do not share this medicine with others. What if I miss a dose? It is important not to miss your dose. Call your doctor or health care professional if you are unable to keep an appointment. What may interact with this medicine? Do not take this medicine with any of the following medications: -clozapine This medicine may also interact with the following medications: -atazanavir -cimetidine -ciprofloxacin -enoxacin -fluvoxamine -medicines for seizures like carbamazepine and phenobarbital -mexiletine -rifampin -tacrine -thiabendazole -zileuton This list may not describe all possible interactions. Give your health care provider a list of all the medicines, herbs, non-prescription drugs, or dietary supplements you use. Also tell them if you smoke, drink alcohol, or use illegal drugs. Some items may interact with your medicine. What should I watch for while using this medicine? Your condition will be monitored carefully while you are receiving this medicine. This drug may make you feel generally unwell. This is not uncommon, as chemotherapy can affect healthy cells as well as cancer cells. Report any side effects. Continue your course of treatment even though you feel ill unless your doctor tells you to stop. Call your doctor or health care professional for advice if you get a fever, chills or sore throat, or other symptoms of a cold or flu. Do not treat yourself. This drug decreases your body's ability to fight infections. Try to avoid being around people who are sick. This medicine may increase your risk to bruise or bleed. Call your doctor or health care professional if you notice any unusual bleeding. Be careful brushing and flossing your teeth or using a toothpick  because you may get an infection or bleed more easily. If you have any dental work done, tell your dentist you are receiving this medicine. Avoid taking products that contain aspirin, acetaminophen, ibuprofen, naproxen, or ketoprofen unless instructed by your doctor. These medicines may hide a fever. Do not become pregnant while taking this medicine. Women should inform their doctor if they wish to become pregnant or think they might be pregnant. There is a potential for serious side effects to an unborn child. Men should inform their doctors if they wish to father a child. This medicine may lower sperm counts. Talk to your health care professional or pharmacist for more information. Do not breast-feed an infant while taking this medicine. What side effects may I notice from receiving this medicine? Side effects that you should report to your doctor or health care professional as soon as possible: -allergic reactions like skin rash, itching or hives, swelling of the face, lips, or tongue -low blood counts - this medicine may decrease the number of white blood cells, red blood cells and platelets. You may be at increased risk for infections and bleeding. -signs of infection - fever or chills, cough, sore throat, pain or difficulty passing urine -signs of decreased platelets or bleeding - bruising, pinpoint red spots on the skin, black, tarry stools,  blood in the urine -signs of decreased red blood cells - unusually weak or tired, fainting spells, lightheadedness -trouble passing urine or change in the amount of urine Side effects that usually do not require medical attention (report to your doctor or health care professional if they continue or are bothersome): -diarrhea This list may not describe all possible side effects. Call your doctor for medical advice about side effects. You may report side effects to FDA at 1-800-FDA-1088. Where should I keep my medicine? This drug is given in a hospital or  clinic and will not be stored at home. NOTE: This sheet is a summary. It may not cover all possible information. If you have questions about this medicine, talk to your doctor, pharmacist, or health care provider.  2015, Elsevier/Gold Standard. (2011-07-08 14:15:47)

## 2013-11-01 ENCOUNTER — Ambulatory Visit (HOSPITAL_BASED_OUTPATIENT_CLINIC_OR_DEPARTMENT_OTHER): Payer: Medicare Other

## 2013-11-01 VITALS — BP 153/67 | HR 63 | Temp 97.8°F

## 2013-11-01 DIAGNOSIS — C8589 Other specified types of non-Hodgkin lymphoma, extranodal and solid organ sites: Secondary | ICD-10-CM

## 2013-11-01 DIAGNOSIS — C8590 Non-Hodgkin lymphoma, unspecified, unspecified site: Secondary | ICD-10-CM

## 2013-11-01 DIAGNOSIS — Z5111 Encounter for antineoplastic chemotherapy: Secondary | ICD-10-CM

## 2013-11-01 MED ORDER — ONDANSETRON 8 MG/50ML IVPB (CHCC)
8.0000 mg | Freq: Once | INTRAVENOUS | Status: AC
  Administered 2013-11-01: 8 mg via INTRAVENOUS

## 2013-11-01 MED ORDER — SODIUM CHLORIDE 0.9 % IV SOLN
Freq: Once | INTRAVENOUS | Status: AC
  Administered 2013-11-01: 12:00:00 via INTRAVENOUS

## 2013-11-01 MED ORDER — ALTEPLASE 2 MG IJ SOLR
2.0000 mg | Freq: Once | INTRAMUSCULAR | Status: DC | PRN
  Filled 2013-11-01: qty 2

## 2013-11-01 MED ORDER — DEXAMETHASONE SODIUM PHOSPHATE 10 MG/ML IJ SOLN
INTRAMUSCULAR | Status: AC
  Filled 2013-11-01: qty 1

## 2013-11-01 MED ORDER — SODIUM CHLORIDE 0.9 % IJ SOLN
10.0000 mL | INTRAMUSCULAR | Status: DC | PRN
  Administered 2013-11-01: 10 mL
  Filled 2013-11-01: qty 10

## 2013-11-01 MED ORDER — SODIUM CHLORIDE 0.9 % IJ SOLN
3.0000 mL | INTRAMUSCULAR | Status: DC | PRN
  Filled 2013-11-01: qty 10

## 2013-11-01 MED ORDER — HEPARIN SOD (PORK) LOCK FLUSH 100 UNIT/ML IV SOLN
500.0000 [IU] | Freq: Once | INTRAVENOUS | Status: AC | PRN
  Administered 2013-11-01: 500 [IU]
  Filled 2013-11-01: qty 5

## 2013-11-01 MED ORDER — DEXAMETHASONE SODIUM PHOSPHATE 10 MG/ML IJ SOLN
10.0000 mg | Freq: Once | INTRAMUSCULAR | Status: AC
  Administered 2013-11-01: 10 mg via INTRAVENOUS

## 2013-11-01 MED ORDER — SODIUM CHLORIDE 0.9 % IV SOLN
72.0000 mg/m2 | Freq: Once | INTRAVENOUS | Status: AC
  Administered 2013-11-01: 135 mg via INTRAVENOUS
  Filled 2013-11-01: qty 1.5

## 2013-11-01 MED ORDER — HEPARIN SOD (PORK) LOCK FLUSH 100 UNIT/ML IV SOLN
250.0000 [IU] | Freq: Once | INTRAVENOUS | Status: DC | PRN
  Filled 2013-11-01: qty 5

## 2013-11-01 NOTE — Patient Instructions (Signed)
Bendamustine Injection What is this medicine? BENDAMUSTINE (BEN da MUS teen) is a chemotherapy drug. It is used to treat chronic lymphocytic leukemia and non-Hodgkin lymphoma. This medicine may be used for other purposes; ask your health care provider or pharmacist if you have questions. COMMON BRAND NAME(S): Treanda What should I tell my health care provider before I take this medicine? They need to know if you have any of these conditions: -kidney disease -liver disease -an unusual or allergic reaction to bendamustine, mannitol, other medicines, foods, dyes, or preservatives -pregnant or trying to get pregnant -breast-feeding How should I use this medicine? This medicine is for infusion into a vein. It is given by a health care professional in a hospital or clinic setting. Talk to your pediatrician regarding the use of this medicine in children. Special care may be needed. Overdosage: If you think you have taken too much of this medicine contact a poison control center or emergency room at once. NOTE: This medicine is only for you. Do not share this medicine with others. What if I miss a dose? It is important not to miss your dose. Call your doctor or health care professional if you are unable to keep an appointment. What may interact with this medicine? Do not take this medicine with any of the following medications: -clozapine This medicine may also interact with the following medications: -atazanavir -cimetidine -ciprofloxacin -enoxacin -fluvoxamine -medicines for seizures like carbamazepine and phenobarbital -mexiletine -rifampin -tacrine -thiabendazole -zileuton This list may not describe all possible interactions. Give your health care provider a list of all the medicines, herbs, non-prescription drugs, or dietary supplements you use. Also tell them if you smoke, drink alcohol, or use illegal drugs. Some items may interact with your medicine. What should I watch for while  using this medicine? Your condition will be monitored carefully while you are receiving this medicine. This drug may make you feel generally unwell. This is not uncommon, as chemotherapy can affect healthy cells as well as cancer cells. Report any side effects. Continue your course of treatment even though you feel ill unless your doctor tells you to stop. Call your doctor or health care professional for advice if you get a fever, chills or sore throat, or other symptoms of a cold or flu. Do not treat yourself. This drug decreases your body's ability to fight infections. Try to avoid being around people who are sick. This medicine may increase your risk to bruise or bleed. Call your doctor or health care professional if you notice any unusual bleeding. Be careful brushing and flossing your teeth or using a toothpick because you may get an infection or bleed more easily. If you have any dental work done, tell your dentist you are receiving this medicine. Avoid taking products that contain aspirin, acetaminophen, ibuprofen, naproxen, or ketoprofen unless instructed by your doctor. These medicines may hide a fever. Do not become pregnant while taking this medicine. Women should inform their doctor if they wish to become pregnant or think they might be pregnant. There is a potential for serious side effects to an unborn child. Men should inform their doctors if they wish to father a child. This medicine may lower sperm counts. Talk to your health care professional or pharmacist for more information. Do not breast-feed an infant while taking this medicine. What side effects may I notice from receiving this medicine? Side effects that you should report to your doctor or health care professional as soon as possible: -allergic reactions like skin rash,   itching or hives, swelling of the face, lips, or tongue -low blood counts - this medicine may decrease the number of white blood cells, red blood cells and  platelets. You may be at increased risk for infections and bleeding. -signs of infection - fever or chills, cough, sore throat, pain or difficulty passing urine -signs of decreased platelets or bleeding - bruising, pinpoint red spots on the skin, black, tarry stools, blood in the urine -signs of decreased red blood cells - unusually weak or tired, fainting spells, lightheadedness -trouble passing urine or change in the amount of urine Side effects that usually do not require medical attention (report to your doctor or health care professional if they continue or are bothersome): -diarrhea This list may not describe all possible side effects. Call your doctor for medical advice about side effects. You may report side effects to FDA at 1-800-FDA-1088. Where should I keep my medicine? This drug is given in a hospital or clinic and will not be stored at home. NOTE: This sheet is a summary. It may not cover all possible information. If you have questions about this medicine, talk to your doctor, pharmacist, or health care provider.  2015, [employer] Standard. (2011-07-08 14:15:47)  

## 2013-11-11 ENCOUNTER — Encounter: Payer: Self-pay | Admitting: Hematology & Oncology

## 2013-11-13 ENCOUNTER — Ambulatory Visit: Payer: Medicare Other | Admitting: Family

## 2013-11-13 ENCOUNTER — Other Ambulatory Visit: Payer: Medicare Other | Admitting: Lab

## 2013-11-13 ENCOUNTER — Encounter: Payer: Self-pay | Admitting: Family

## 2013-11-13 ENCOUNTER — Other Ambulatory Visit (HOSPITAL_BASED_OUTPATIENT_CLINIC_OR_DEPARTMENT_OTHER): Payer: Medicare Other | Admitting: Lab

## 2013-11-13 ENCOUNTER — Ambulatory Visit (HOSPITAL_BASED_OUTPATIENT_CLINIC_OR_DEPARTMENT_OTHER): Payer: Medicare Other | Admitting: Family

## 2013-11-13 ENCOUNTER — Telehealth (INDEPENDENT_AMBULATORY_CARE_PROVIDER_SITE_OTHER): Payer: Self-pay | Admitting: General Surgery

## 2013-11-13 VITALS — BP 151/78 | HR 68 | Temp 97.8°F | Resp 14 | Ht 61.0 in | Wt 171.0 lb

## 2013-11-13 DIAGNOSIS — C8589 Other specified types of non-Hodgkin lymphoma, extranodal and solid organ sites: Secondary | ICD-10-CM | POA: Diagnosis not present

## 2013-11-13 DIAGNOSIS — C8590 Non-Hodgkin lymphoma, unspecified, unspecified site: Secondary | ICD-10-CM

## 2013-11-13 DIAGNOSIS — E559 Vitamin D deficiency, unspecified: Secondary | ICD-10-CM

## 2013-11-13 LAB — CBC WITH DIFFERENTIAL (CANCER CENTER ONLY)
BASO#: 0.1 10*3/uL (ref 0.0–0.2)
BASO%: 1 % (ref 0.0–2.0)
EOS%: 4 % (ref 0.0–7.0)
Eosinophils Absolute: 0.2 10*3/uL (ref 0.0–0.5)
HCT: 32.9 % — ABNORMAL LOW (ref 34.8–46.6)
HGB: 11.1 g/dL — ABNORMAL LOW (ref 11.6–15.9)
LYMPH#: 0.4 10*3/uL — ABNORMAL LOW (ref 0.9–3.3)
LYMPH%: 8.5 % — AB (ref 14.0–48.0)
MCH: 28.1 pg (ref 26.0–34.0)
MCHC: 33.7 g/dL (ref 32.0–36.0)
MCV: 83 fL (ref 81–101)
MONO#: 1.2 10*3/uL — AB (ref 0.1–0.9)
MONO%: 22.8 % — ABNORMAL HIGH (ref 0.0–13.0)
NEUT#: 3.2 10*3/uL (ref 1.5–6.5)
NEUT%: 63.7 % (ref 39.6–80.0)
PLATELETS: 219 10*3/uL (ref 145–400)
RBC: 3.95 10*6/uL (ref 3.70–5.32)
RDW: 17.8 % — ABNORMAL HIGH (ref 11.1–15.7)
WBC: 5 10*3/uL (ref 3.9–10.0)

## 2013-11-13 LAB — CMP (CANCER CENTER ONLY)
ALK PHOS: 60 U/L (ref 26–84)
ALT(SGPT): 7 U/L — ABNORMAL LOW (ref 10–47)
AST: 23 U/L (ref 11–38)
Albumin: 3.4 g/dL (ref 3.3–5.5)
BUN: 9 mg/dL (ref 7–22)
CO2: 29 mEq/L (ref 18–33)
Calcium: 9.2 mg/dL (ref 8.0–10.3)
Chloride: 100 mEq/L (ref 98–108)
Creat: 0.6 mg/dl (ref 0.6–1.2)
Glucose, Bld: 93 mg/dL (ref 73–118)
Potassium: 3.1 mEq/L — ABNORMAL LOW (ref 3.3–4.7)
SODIUM: 136 meq/L (ref 128–145)
TOTAL PROTEIN: 6.5 g/dL (ref 6.4–8.1)
Total Bilirubin: 0.7 mg/dl (ref 0.20–1.60)

## 2013-11-13 NOTE — Telephone Encounter (Signed)
LMOM asking Makayla Fisher to give me a call back.  She had called earlier this morning with some questions.  I am unsure of what the questions were so I was returning her call to find out.

## 2013-11-13 NOTE — Progress Notes (Signed)
[name]  [phone] [phone]) [phone]  ID: [name] OB: 1922-08-27 MR#: [id number]#:[id number] Patient Care Team: [name] as PCP - General (Family Medicine)  DIAGNOSIS: Follicular large cell non-Hodgkin's lymphoma  INTERVAL HISTORY: [name] Fisher is here today with her niece for a follow-up visit after her first chemo. She is feeling good. She had no problems during or after her treatment. She was first seen in June and at that point in time had a biopsy done of a mesenteric mass. This measured 3.8 x 2.3 cm. The biopsy showed a follicular lymphoma. They felt that there may be a large cell component to this. She had a PET scan on her that showed mesenteric and retroperitoneal lymphadenopathy. She has a right inguinal lymphadenopathy. There was a T8 compression fracture felt to be non-malignant. We did do hepatitis studies on her. These were all negative. Her LDH was 141. She has good energy and appetite. She denies fever, chills, n/v, headaches, dizziness, SOB, chest pain, palpitations, abdominal pain, constipation, diarrhea, problems urinating, blood in urine or stool. She denies swelling, tenderness, numbness or tingling in her extremities. She understands that we probably are not going to cure this but we can at least try to get her into remission which should be long lasting.  CURRENT TREATMENT:  Treanda/Rituxan  REVIEW OF SYSTEMS: All other 10 point review of systems was negative.    PAST MEDICAL HISTORY: Past Medical History  Diagnosis Date  . Heart attack   . NHL (non-Hodgkin's lymphoma) 10/18/2013   PAST SURGICAL HISTORY: Past Surgical History  Procedure Laterality Date  . Colon surgery  2014    Colostomy  . Cardiac surgery  2009  . Coronary angioplasty with stent placement  2009  . C section  1947, 1963  . Cholecystectomy  1973  . Esophagogastroduodenoscopy  2013   FAMILY HISTORY Family History  Problem Relation Age of Onset  .  Diverticulitis Mother   . Heart attack Mother   . Ulcers Father    GYNECOLOGIC HISTORY:  No LMP recorded. Patient is postmenopausal.   SOCIAL HISTORY:  History   Social History  . Marital Status: Married    Spouse Name: N/A    Number of Children: N/A  . Years of Education: N/A   Occupational History  . Not on file.   Social History Main Topics  . Smoking status: Former Smoker -- 1.00 packs/day for 25 years    Types: Cigarettes    Start date: 05/16/1948    Quit date: 05/16/1973  . Smokeless tobacco: Never Used     Comment: quit smoking 40 years ago  . Alcohol Use: No  . Drug Use: No  . Sexual Activity: Not on file   Other Topics Concern  . Not on file   Social History Narrative  . No narrative on file   ADVANCED DIRECTIVES: <no information>  HEALTH MAINTENANCE: History  Substance Use Topics  . Smoking status: Former Smoker -- 1.00 packs/day for 25 years    Types: Cigarettes    Start date: 05/16/1948    Quit date: 05/16/1973  . Smokeless tobacco: Never Used     Comment: quit smoking 40 years ago  . Alcohol Use: No   Colonoscopy: PAP: Bone density: Lipid panel:  Allergies  Allergen Reactions  . Epipen [Epinephrine Hcl]     Heart palpitations  . Heparin     Unsure of side affects (maybe affected platelets per pt)  . Levaquin [Levofloxacin In D5w] Other (See  Comments)    Weakness  . Sulfa Antibiotics Rash   Current Outpatient Prescriptions  Medication Sig Dispense Refill  . acetaminophen (TYLENOL) 500 MG tablet Take 500 mg by mouth every 6 (six) hours as needed for mild pain.      Marland Kitchen amLODipine (NORVASC) 5 MG tablet Take 5 mg by mouth every morning.       Marland Kitchen aspirin 81 MG tablet Take 81 mg by mouth every other day.       . carvedilol (COREG) 3.125 MG tablet Take 3.125 mg by mouth daily.       . cholecalciferol (VITAMIN D) 1000 UNITS tablet Take 2 tablets (2,000 Units total) by mouth daily.  60 tablet  6  . cycloSPORINE (RESTASIS) 0.05 % ophthalmic  emulsion Place 1 drop into both eyes daily.       Marland Kitchen docusate sodium (COLACE) 100 MG capsule Take 100 mg by mouth 2 (two) times daily.      Marland Kitchen ezetimibe (ZETIA) 10 MG tablet Take 10 mg by mouth every morning.       . furosemide (LASIX) 20 MG tablet Take 20 mg by mouth every morning.       Marland Kitchen levothyroxine (SYNTHROID, LEVOTHROID) 75 MCG tablet Take 75 mcg by mouth daily before breakfast.      . lisinopril (PRINIVIL,ZESTRIL) 20 MG tablet Take 20 mg by mouth 2 (two) times daily.      . Multiple Vitamin (MULTIVITAMIN WITH MINERALS) TABS tablet Take 1 tablet by mouth daily.      . ondansetron (ZOFRAN) 8 MG tablet Take 1 tablet (8 mg total) by mouth 2 (two) times daily. For 4 days following chemo  20 tablet  2  . pantoprazole (PROTONIX) 40 MG tablet Take 40 mg by mouth daily.      Marland Kitchen rOPINIRole (REQUIP) 1 MG tablet Take 1 mg by mouth at bedtime.      . sertraline (ZOLOFT) 25 MG tablet Take 25 mg by mouth every morning.       . simvastatin (ZOCOR) 20 MG tablet Take 20 mg by mouth every evening.       . Tiotropium Bromide Monohydrate (SPIRIVA RESPIMAT) 2.5 MCG/ACT AERS Inhale 1 puff into the lungs daily.      . traMADol (ULTRAM) 50 MG tablet Take 50 mg by mouth every 6 (six) hours as needed for moderate pain.      Marland Kitchen prochlorperazine (COMPAZINE) 10 MG tablet Take 0.5 tablets (5 mg total) by mouth every 6 (six) hours as needed for nausea or vomiting.  30 tablet  3   No current facility-administered medications for this visit.   OBJECTIVE: Filed Vitals:   11/13/13 1141  BP: 151/78  Pulse: 68  Temp: 97.8 F (36.6 C)  Resp: 14   Body mass index is 32.33 kg/(m^2). ECOG FS:0 - Asymptomatic Ocular: Sclerae unicteric, pupils equal, round and reactive to light Ear-nose-throat: Oropharynx clear, dentition fair Lymphatic: No cervical or supraclavicular adenopathy Lungs no rales or rhonchi, good excursion bilaterally Heart regular rate and rhythm, no murmur appreciated Abd soft, nontender, positive bowel  sounds MSK no focal spinal tenderness, no joint edema Neuro: non-focal, well-oriented, appropriate affect Breasts: Deferred  LAB RESULTS: CMP     Component Value Date/Time   NA 136 11/13/2013 1055   NA 134* 09/25/2013 1141   K 3.1* 11/13/2013 1055   K 4.1 09/25/2013 1141   CL 100 11/13/2013 1055   CL 98 09/25/2013 1141   CO2 29 11/13/2013 1055   CO2  25 09/25/2013 1141   GLUCOSE 93 11/13/2013 1055   GLUCOSE 103* 09/25/2013 1141   BUN 9 11/13/2013 1055   BUN 13 09/25/2013 1141   CREATININE 0.6 11/13/2013 1055   CREATININE 0.61 09/25/2013 1141   CALCIUM 9.2 11/13/2013 1055   CALCIUM 9.4 09/25/2013 1141   PROT 6.5 11/13/2013 1055   PROT 6.5 09/25/2013 1141   ALBUMIN 3.9 09/25/2013 1141   AST 23 11/13/2013 1055   AST 15 09/25/2013 1141   ALT 7* 11/13/2013 1055   ALT <8 09/25/2013 1141   ALKPHOS 60 11/13/2013 1055   ALKPHOS 90 09/25/2013 1141   BILITOT 0.70 11/13/2013 1055   BILITOT 0.4 09/25/2013 1141   GFRNONAA 86* 08/19/2013 1412   GFRAA >90 08/19/2013 1412   No results found for this basename: SPEP, UPEP,  kappa and lambda light chains   Lab Results  Component Value Date   WBC 5.0 11/13/2013   NEUTROABS 3.2 11/13/2013   HGB 11.1* 11/13/2013   HCT 32.9* 11/13/2013   MCV 83 11/13/2013   PLT 219 11/13/2013   No results found for this basename: LABCA2   No components found with this basename: LABCA125   No results found for this basename: INR,  in the last 168 hours  STUDIES: None  ASSESSMENT/PLAN: [name] Fisher is an 80 year old white female with follicular large cell lymphoma.She is CD 10 positive which would indicate follicular nature. There is a large cell component on the biopsy.  She had her Port a cath placed and the area has healed very nicely without sign of infection. Her first treatment was on July 9th. We went back over possible side effects today and symptom management. She verbalized understanding.  Her next treatment will be in August.  Her CBC was negative.   After her second treatment, we  will go ahead and do another PET scan. All questions were answered and she and her niece are in agreement with the plan. She knows to call here with any questions or concerns and to go to the ED in the event of an emergency. We can certainly see her back sooner if need be.   Eliezer Bottom, NP 11/13/2013 1:18 PM

## 2013-11-14 LAB — URIC ACID: Uric Acid, Serum: 4 mg/dL (ref 2.4–7.0)

## 2013-11-14 LAB — LACTATE DEHYDROGENASE: LDH: 172 U/L (ref 94–250)

## 2013-11-14 LAB — VITAMIN D 25 HYDROXY (VIT D DEFICIENCY, FRACTURES): Vit D, 25-Hydroxy: 50 ng/mL (ref 30–89)

## 2013-11-14 NOTE — Telephone Encounter (Signed)
I don't recommend surgery while on chemo.  Either we can do something once she is done or she would need to take a break from chemo to have the surgery (probably ~2 months).

## 2013-11-14 NOTE — Telephone Encounter (Signed)
[name] returned my call and explained that all she wanted to know was if her mom's ostomy site was going to be reconstructed in any form.  She thought that Dr. 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  Informed [name] that I would send this to Dr. Marcello Moores and have her update me a little bit more on this patients POC and then I would give her a call back.  Informed her that we will still need to see Makayla Fisher in September for a follow up appt.  [name] verbalized understanding.

## 2013-11-27 ENCOUNTER — Other Ambulatory Visit: Payer: Self-pay | Admitting: *Deleted

## 2013-11-27 DIAGNOSIS — C8590 Non-Hodgkin lymphoma, unspecified, unspecified site: Secondary | ICD-10-CM

## 2013-11-28 ENCOUNTER — Encounter: Payer: Self-pay | Admitting: Hematology & Oncology

## 2013-11-28 ENCOUNTER — Ambulatory Visit (HOSPITAL_BASED_OUTPATIENT_CLINIC_OR_DEPARTMENT_OTHER): Payer: Medicare Other

## 2013-11-28 ENCOUNTER — Ambulatory Visit (HOSPITAL_BASED_OUTPATIENT_CLINIC_OR_DEPARTMENT_OTHER): Payer: Medicare Other | Admitting: Hematology & Oncology

## 2013-11-28 ENCOUNTER — Other Ambulatory Visit (HOSPITAL_BASED_OUTPATIENT_CLINIC_OR_DEPARTMENT_OTHER): Payer: Medicare Other | Admitting: Lab

## 2013-11-28 VITALS — BP 165/69 | HR 69 | Temp 97.2°F | Resp 14 | Ht 61.0 in | Wt 165.0 lb

## 2013-11-28 DIAGNOSIS — C8590 Non-Hodgkin lymphoma, unspecified, unspecified site: Secondary | ICD-10-CM

## 2013-11-28 DIAGNOSIS — C8589 Other specified types of non-Hodgkin lymphoma, extranodal and solid organ sites: Secondary | ICD-10-CM

## 2013-11-28 DIAGNOSIS — K589 Irritable bowel syndrome without diarrhea: Secondary | ICD-10-CM

## 2013-11-28 DIAGNOSIS — Z5112 Encounter for antineoplastic immunotherapy: Secondary | ICD-10-CM

## 2013-11-28 LAB — CMP (CANCER CENTER ONLY)
ALBUMIN: 3.7 g/dL (ref 3.3–5.5)
ALK PHOS: 57 U/L (ref 26–84)
ALT(SGPT): 13 U/L (ref 10–47)
AST: 24 U/L (ref 11–38)
BUN, Bld: 10 mg/dL (ref 7–22)
CALCIUM: 8.7 mg/dL (ref 8.0–10.3)
CHLORIDE: 101 meq/L (ref 98–108)
CO2: 25 mEq/L (ref 18–33)
Creat: 0.7 mg/dl (ref 0.6–1.2)
GLUCOSE: 97 mg/dL (ref 73–118)
POTASSIUM: 3.6 meq/L (ref 3.3–4.7)
SODIUM: 137 meq/L (ref 128–145)
TOTAL PROTEIN: 6.6 g/dL (ref 6.4–8.1)
Total Bilirubin: 0.6 mg/dl (ref 0.20–1.60)

## 2013-11-28 LAB — CBC WITH DIFFERENTIAL (CANCER CENTER ONLY)
BASO#: 0.1 10*3/uL (ref 0.0–0.2)
BASO%: 1.4 % (ref 0.0–2.0)
EOS ABS: 0.1 10*3/uL (ref 0.0–0.5)
EOS%: 3.3 % (ref 0.0–7.0)
HEMATOCRIT: 34.2 % — AB (ref 34.8–46.6)
HEMOGLOBIN: 11.4 g/dL — AB (ref 11.6–15.9)
LYMPH#: 0.5 10*3/uL — AB (ref 0.9–3.3)
LYMPH%: 13.3 % — ABNORMAL LOW (ref 14.0–48.0)
MCH: 28.1 pg (ref 26.0–34.0)
MCHC: 33.3 g/dL (ref 32.0–36.0)
MCV: 84 fL (ref 81–101)
MONO#: 0.9 10*3/uL (ref 0.1–0.9)
MONO%: 23 % — ABNORMAL HIGH (ref 0.0–13.0)
NEUT#: 2.2 10*3/uL (ref 1.5–6.5)
NEUT%: 59 % (ref 39.6–80.0)
Platelets: 134 10*3/uL — ABNORMAL LOW (ref 145–400)
RBC: 4.05 10*6/uL (ref 3.70–5.32)
RDW: 17.4 % — AB (ref 11.1–15.7)
WBC: 3.7 10*3/uL — AB (ref 3.9–10.0)

## 2013-11-28 LAB — URIC ACID: Uric Acid, Serum: 5.3 mg/dL (ref 2.4–7.0)

## 2013-11-28 LAB — LACTATE DEHYDROGENASE: LDH: 152 U/L (ref 94–250)

## 2013-11-28 MED ORDER — DIPHENHYDRAMINE HCL 25 MG PO CAPS
50.0000 mg | ORAL_CAPSULE | Freq: Once | ORAL | Status: AC
  Administered 2013-11-28: 50 mg via ORAL

## 2013-11-28 MED ORDER — SODIUM CHLORIDE 0.9 % IV SOLN
375.0000 mg/m2 | Freq: Once | INTRAVENOUS | Status: AC
  Administered 2013-11-28: 700 mg via INTRAVENOUS
  Filled 2013-11-28: qty 70

## 2013-11-28 MED ORDER — DEXAMETHASONE SODIUM PHOSPHATE 10 MG/ML IJ SOLN
10.0000 mg | Freq: Once | INTRAMUSCULAR | Status: AC
  Administered 2013-11-28: 10 mg via INTRAVENOUS

## 2013-11-28 MED ORDER — ONDANSETRON 8 MG/50ML IVPB (CHCC)
8.0000 mg | Freq: Once | INTRAVENOUS | Status: AC
  Administered 2013-11-28: 8 mg via INTRAVENOUS

## 2013-11-28 MED ORDER — ACETAMINOPHEN 325 MG PO TABS
ORAL_TABLET | ORAL | Status: AC
  Filled 2013-11-28: qty 2

## 2013-11-28 MED ORDER — SODIUM CHLORIDE 0.9 % IJ SOLN
10.0000 mL | INTRAMUSCULAR | Status: DC | PRN
Start: 2013-11-28 — End: 2013-11-28
  Administered 2013-11-28: 10 mL
  Filled 2013-11-28: qty 10

## 2013-11-28 MED ORDER — HEPARIN SOD (PORK) LOCK FLUSH 100 UNIT/ML IV SOLN
500.0000 [IU] | Freq: Once | INTRAVENOUS | Status: AC | PRN
  Administered 2013-11-28: 500 [IU]
  Filled 2013-11-28: qty 5

## 2013-11-28 MED ORDER — DEXAMETHASONE SODIUM PHOSPHATE 10 MG/ML IJ SOLN
INTRAMUSCULAR | Status: AC
  Filled 2013-11-28: qty 1

## 2013-11-28 MED ORDER — PB-HYOSCY-ATROPINE-SCOPOLAMINE 16.2 MG/5ML PO ELIX: 5.0000 mL | ORAL_SOLUTION | Freq: Three times a day (TID) | ORAL | Status: DC

## 2013-11-28 MED ORDER — SODIUM CHLORIDE 0.9 % IV SOLN
72.0000 mg/m2 | Freq: Once | INTRAVENOUS | Status: AC
  Administered 2013-11-28: 135 mg via INTRAVENOUS
  Filled 2013-11-28: qty 1.5

## 2013-11-28 MED ORDER — ACETAMINOPHEN 325 MG PO TABS
650.0000 mg | ORAL_TABLET | Freq: Once | ORAL | Status: AC
  Administered 2013-11-28: 650 mg via ORAL

## 2013-11-28 MED ORDER — SODIUM CHLORIDE 0.9 % IV SOLN
Freq: Once | INTRAVENOUS | Status: AC
  Administered 2013-11-28: 09:00:00 via INTRAVENOUS

## 2013-11-28 MED ORDER — DIPHENHYDRAMINE HCL 25 MG PO CAPS
ORAL_CAPSULE | ORAL | Status: AC
  Filled 2013-11-28: qty 2

## 2013-11-28 NOTE — Patient Instructions (Signed)
Bendamustine Injection What is this medicine? BENDAMUSTINE (BEN da MUS teen) is a chemotherapy drug. It is used to treat chronic lymphocytic leukemia and non-Hodgkin lymphoma. This medicine may be used for other purposes; ask your health care provider or pharmacist if you have questions. COMMON BRAND NAME(S): Treanda What should I tell my health care provider before I take this medicine? They need to know if you have any of these conditions: -kidney disease -liver disease -an unusual or allergic reaction to bendamustine, mannitol, other medicines, foods, dyes, or preservatives -pregnant or trying to get pregnant -breast-feeding How should I use this medicine? This medicine is for infusion into a vein. It is given by a health care professional in a hospital or clinic setting. Talk to your pediatrician regarding the use of this medicine in children. Special care may be needed. Overdosage: If you think you have taken too much of this medicine contact a poison control center or emergency room at once. NOTE: This medicine is only for you. Do not share this medicine with others. What if I miss a dose? It is important not to miss your dose. Call your doctor or health care professional if you are unable to keep an appointment. What may interact with this medicine? Do not take this medicine with any of the following medications: -clozapine This medicine may also interact with the following medications: -atazanavir -cimetidine -ciprofloxacin -enoxacin -fluvoxamine -medicines for seizures like carbamazepine and phenobarbital -mexiletine -rifampin -tacrine -thiabendazole -zileuton This list may not describe all possible interactions. Give your health care provider a list of all the medicines, herbs, non-prescription drugs, or dietary supplements you use. Also tell them if you smoke, drink alcohol, or use illegal drugs. Some items may interact with your medicine. What should I watch for while  using this medicine? Your condition will be monitored carefully while you are receiving this medicine. This drug may make you feel generally unwell. This is not uncommon, as chemotherapy can affect healthy cells as well as cancer cells. Report any side effects. Continue your course of treatment even though you feel ill unless your doctor tells you to stop. Call your doctor or health care professional for advice if you get a fever, chills or sore throat, or other symptoms of a cold or flu. Do not treat yourself. This drug decreases your body's ability to fight infections. Try to avoid being around people who are sick. This medicine may increase your risk to bruise or bleed. Call your doctor or health care professional if you notice any unusual bleeding. Be careful brushing and flossing your teeth or using a toothpick because you may get an infection or bleed more easily. If you have any dental work done, tell your dentist you are receiving this medicine. Avoid taking products that contain aspirin, acetaminophen, ibuprofen, naproxen, or ketoprofen unless instructed by your doctor. These medicines may hide a fever. Do not become pregnant while taking this medicine. Women should inform their doctor if they wish to become pregnant or think they might be pregnant. There is a potential for serious side effects to an unborn child. Men should inform their doctors if they wish to father a child. This medicine may lower sperm counts. Talk to your health care professional or pharmacist for more information. Do not breast-feed an infant while taking this medicine. What side effects may I notice from receiving this medicine? Side effects that you should report to your doctor or health care professional as soon as possible: -allergic reactions like skin rash,   itching or hives, swelling of the face, lips, or tongue -low blood counts - this medicine may decrease the number of white blood cells, red blood cells and  platelets. You may be at increased risk for infections and bleeding. -signs of infection - fever or chills, cough, sore throat, pain or difficulty passing urine -signs of decreased platelets or bleeding - bruising, pinpoint red spots on the skin, black, tarry stools, blood in the urine -signs of decreased red blood cells - unusually weak or tired, fainting spells, lightheadedness -trouble passing urine or change in the amount of urine Side effects that usually do not require medical attention (report to your doctor or health care professional if they continue or are bothersome): -diarrhea This list may not describe all possible side effects. Call your doctor for medical advice about side effects. You may report side effects to FDA at 1-800-FDA-1088. Where should I keep my medicine? This drug is given in a hospital or clinic and will not be stored at home. NOTE: This sheet is a summary. It may not cover all possible information. If you have questions about this medicine, talk to your doctor, pharmacist, or health care provider.  2015, [employer] Standard. (2011-07-08 14:15:47) Rituximab injection What is this medicine? RITUXIMAB (ri TUX i mab) is a monoclonal antibody. This medicine changes the way the body's immune system works. It is used commonly to treat non-Hodgkin's lymphoma and other conditions. In cancer cells, this drug targets a specific protein within cancer cells and stops the cancer cells from growing. It is also used to treat rhuematoid arthritis (RA). In RA, this medicine slow the inflammatory process and help reduce joint pain and swelling. This medicine is often used with other cancer or arthritis medications. This medicine may be used for other purposes; ask your health care provider or pharmacist if you have questions. COMMON BRAND NAME(S): Rituxan What should I tell my health care provider before I take this medicine? They need to know if you have any of these  conditions: -blood disorders -heart disease -history of hepatitis B -infection (especially a virus infection such as chickenpox, cold sores, or herpes) -irregular heartbeat -kidney disease -lung or breathing disease, like asthma -lupus -an unusual or allergic reaction to rituximab, mouse proteins, other medicines, foods, dyes, or preservatives -pregnant or trying to get pregnant -breast-feeding How should I use this medicine? This medicine is for infusion into a vein. It is administered in a hospital or clinic by a specially trained health care professional. A special MedGuide will be given to you by the pharmacist with each prescription and refill. Be sure to read this information carefully each time. Talk to your pediatrician regarding the use of this medicine in children. This medicine is not approved for use in children. Overdosage: If you think you have taken too much of this medicine contact a poison control center or emergency room at once. NOTE: This medicine is only for you. Do not share this medicine with others. What if I miss a dose? It is important not to miss a dose. Call your doctor or health care professional if you are unable to keep an appointment. What may interact with this medicine? -cisplatin -medicines for blood pressure -some other medicines for arthritis -vaccines This list may not describe all possible interactions. Give your health care provider a list of all the medicines, herbs, non-prescription drugs, or dietary supplements you use. Also tell them if you smoke, drink alcohol, or use illegal drugs. Some items may interact with   your medicine. What should I watch for while using this medicine? Report any side effects that you notice during your treatment right away, such as changes in your breathing, fever, chills, dizziness or lightheadedness. These effects are more common with the first dose. Visit your prescriber or health care professional for checks on your  progress. You will need to have regular blood work. Report any other side effects. The side effects of this medicine can continue after you finish your treatment. Continue your course of treatment even though you feel ill unless your doctor tells you to stop. Call your doctor or health care professional for advice if you get a fever, chills or sore throat, or other symptoms of a cold or flu. Do not treat yourself. This drug decreases your body's ability to fight infections. Try to avoid being around people who are sick. This medicine may increase your risk to bruise or bleed. Call your doctor or health care professional if you notice any unusual bleeding. Be careful brushing and flossing your teeth or using a toothpick because you may get an infection or bleed more easily. If you have any dental work done, tell your dentist you are receiving this medicine. Avoid taking products that contain aspirin, acetaminophen, ibuprofen, naproxen, or ketoprofen unless instructed by your doctor. These medicines may hide a fever. Do not become pregnant while taking this medicine. Women should inform their doctor if they wish to become pregnant or think they might be pregnant. There is a potential for serious side effects to an unborn child. Talk to your health care professional or pharmacist for more information. Do not breast-feed an infant while taking this medicine. What side effects may I notice from receiving this medicine? Side effects that you should report to your doctor or health care professional as soon as possible: -allergic reactions like skin rash, itching or hives, swelling of the face, lips, or tongue -low blood counts - this medicine may decrease the number of white blood cells, red blood cells and platelets. You may be at increased risk for infections and bleeding. -signs of infection - fever or chills, cough, sore throat, pain or difficulty passing urine -signs of decreased platelets or bleeding -  bruising, pinpoint red spots on the skin, black, tarry stools, blood in the urine -signs of decreased red blood cells - unusually weak or tired, fainting spells, lightheadedness -breathing problems -confused, not responsive -chest pain -fast, irregular heartbeat -feeling faint or lightheaded, falls -mouth sores -redness, blistering, peeling or loosening of the skin, including inside the mouth -stomach pain -swelling of the ankles, feet, or hands -trouble passing urine or change in the amount of urine Side effects that usually do not require medical attention (report to your doctor or other health care professional if they continue or are bothersome): -anxiety -headache -loss of appetite -muscle aches -nausea -night sweats This list may not describe all possible side effects. Call your doctor for medical advice about side effects. You may report side effects to FDA at 1-800-FDA-1088. Where should I keep my medicine? This drug is given in a hospital or clinic and will not be stored at home. NOTE: This sheet is a summary. It may not cover all possible information. If you have questions about this medicine, talk to your doctor, pharmacist, or health care provider.  2015, [employer]/Gold Standard. (2007-12-10 14:04:59)  

## 2013-11-28 NOTE — Progress Notes (Signed)
Hematology and Oncology Follow Up Visit  Kajsa Butrum [id number] February 04, 1923 80 y.o. 11/28/2013   Principle Diagnosis:   Follicular large cell non-Hodgkin's lymphoma  Current Therapy:    Status post cycle 1 of Rituxan/bendamustine     Interim History:  Ms.  Benoist is back for followup. She tolerated her first cycle of treatment quite nicely. She really had no nausea vomiting. I did dose reduce the bendamustine a little bit. I think this helped her.  She has had ostomy issues. She says that she has some frequency of her stools. She says that what she needs the ostomy has a discharge.  I will try to use some Donnatal to help.  She's had no fever. She's had no rashes. She's had no cough or shortness of breath.  Overall, her performance status is ECOG 2 Medications: Current outpatient prescriptions:acetaminophen (TYLENOL) 500 MG tablet, Take 500 mg by mouth every 6 (six) hours as needed for mild pain., Disp: , Rfl: ;  amLODipine (NORVASC) 5 MG tablet, Take 5 mg by mouth every morning. , Disp: , Rfl: ;  aspirin 81 MG tablet, Take 81 mg by mouth every other day. , Disp: , Rfl: ;  carvedilol (COREG) 3.125 MG tablet, Take 3.125 mg by mouth daily. , Disp: , Rfl:  Cholecalciferol (VITAMIN D3) 2000 UNITS TABS, Take by mouth every morning., Disp: , Rfl: ;  cycloSPORINE (RESTASIS) 0.05 % ophthalmic emulsion, Place 1 drop into both eyes 2 (two) times daily. , Disp: , Rfl: ;  docusate sodium (COLACE) 100 MG capsule, Take 100 mg by mouth daily. , Disp: , Rfl: ;  ezetimibe (ZETIA) 10 MG tablet, Take 10 mg by mouth every morning. , Disp: , Rfl:  furosemide (LASIX) 20 MG tablet, Take 20 mg by mouth every morning. , Disp: , Rfl: ;  levothyroxine (SYNTHROID, LEVOTHROID) 75 MCG tablet, Take 75 mcg by mouth daily before breakfast., Disp: , Rfl: ;  lisinopril (PRINIVIL,ZESTRIL) 20 MG tablet, Take 20 mg by mouth 2 (two) times daily., Disp: , Rfl: ;  Multiple Vitamin (MULTIVITAMIN WITH MINERALS) TABS tablet, Take  1 tablet by mouth daily., Disp: , Rfl:  ondansetron (ZOFRAN) 8 MG tablet, Take 1 tablet (8 mg total) by mouth 2 (two) times daily. For 4 days following chemo, Disp: 20 tablet, Rfl: 2;  pantoprazole (PROTONIX) 40 MG tablet, Take 40 mg by mouth daily., Disp: , Rfl: ;  prochlorperazine (COMPAZINE) 10 MG tablet, Take 0.5 tablets (5 mg total) by mouth every 6 (six) hours as needed for nausea or vomiting., Disp: 30 tablet, Rfl: 3 rOPINIRole (REQUIP) 1 MG tablet, Take 1 mg by mouth at bedtime., Disp: , Rfl: ;  sertraline (ZOLOFT) 25 MG tablet, Take 25 mg by mouth every morning. , Disp: , Rfl: ;  simvastatin (ZOCOR) 20 MG tablet, Take 20 mg by mouth every evening. , Disp: , Rfl: ;  Tiotropium Bromide Monohydrate (SPIRIVA RESPIMAT) 2.5 MCG/ACT AERS, Inhale 1 puff into the lungs daily., Disp: , Rfl:  traMADol (ULTRAM) 50 MG tablet, Take 50 mg by mouth every 6 (six) hours as needed for moderate pain., Disp: , Rfl:  No current facility-administered medications for this visit. Facility-Administered Medications Ordered in Other Visits: sodium chloride 0.9 % injection 10 mL, 10 mL, Intracatheter, PRN, [name], MD, 10 mL at 11/28/13 1349  Allergies:  Allergies  Allergen Reactions  . Epipen [Epinephrine Hcl]     Heart palpitations  . Heparin     Unsure of side affects (maybe affected platelets  per pt)  . Levaquin [Levofloxacin In D5w] Other (See Comments)    Weakness  . Sulfa Antibiotics Rash    Past Medical History, Surgical history, Social history, and Family History were reviewed and updated.  Review of Systems: As above  Physical Exam:  height is 5\' 1"  (1.549 m) and weight is 165 lb (74.844 kg). Her oral temperature is 97.2 F (36.2 C). Her blood pressure is 165/69 and her pulse is 69. Her respiration is 14.   Elderly white female. Head and neck exam shows no ocular or oral lesions. There are no palpable cervical or supraclavicular lymph nodes. Lungs are clear bilaterally. Cardiac exam regular  in rhythm with no murmurs rubs or bruits. Abdomen is soft. She has good bowel sounds. The ostomy is intact. There is no stool in the ostomy bag. There is no palpable liver or spleen tip. Her laparotomy scars are well-healed. Back exam no tenderness over the spine ribs or hips. Extremities shows no clubbing cyanosis or edema. Skin exam no rashes.  Lab Results  Component Value Date   WBC 3.7* 11/28/2013   HGB 11.4* 11/28/2013   HCT 34.2* 11/28/2013   MCV 84 11/28/2013   PLT 134* 11/28/2013     Chemistry      Component Value Date/Time   NA 137 11/28/2013 0827   NA 134* 09/25/2013 1141   K 3.6 11/28/2013 0827   K 4.1 09/25/2013 1141   CL 101 11/28/2013 0827   CL 98 09/25/2013 1141   CO2 25 11/28/2013 0827   CO2 25 09/25/2013 1141   BUN 10 11/28/2013 0827   BUN 13 09/25/2013 1141   CREATININE 0.7 11/28/2013 0827   CREATININE 0.61 09/25/2013 1141      Component Value Date/Time   CALCIUM 8.7 11/28/2013 0827   CALCIUM 9.4 09/25/2013 1141   ALKPHOS 57 11/28/2013 0827   ALKPHOS 90 09/25/2013 1141   AST 24 11/28/2013 0827   AST 15 09/25/2013 1141   ALT 13 11/28/2013 0827   ALT <8 09/25/2013 1141   BILITOT 0.60 11/28/2013 0827   BILITOT 0.4 09/25/2013 1141         Impression and Plan: Ms. Lapine is 80 year old female with a follicular large cell lymphoma. We had her on one cycle of chemotherapy. She's tolerated this quite well.  We will go with her second cycle of treatment.  We will then repeat a PET scan on her. I will do this in 3 weeks. I would like to expect that the PET scan should be close to normal.  If the PET scan shows regression of her malignancy, then we will proceed with a total of 6 cycles of treatment.  I will try some Donnatal for her intestinal issues.  I spent about 40 minutes with her today. I answered all of her questions.   [name], MD 8/6/20151:59 PM

## 2013-11-28 NOTE — Addendum Note (Signed)
Addended by: Burney Gauze R on: 11/28/2013 02:45 PM   Modules accepted: Orders

## 2013-11-29 ENCOUNTER — Ambulatory Visit (HOSPITAL_BASED_OUTPATIENT_CLINIC_OR_DEPARTMENT_OTHER): Payer: Medicare Other

## 2013-11-29 VITALS — BP 155/68 | HR 66 | Temp 97.5°F | Resp 20

## 2013-11-29 DIAGNOSIS — C8590 Non-Hodgkin lymphoma, unspecified, unspecified site: Secondary | ICD-10-CM

## 2013-11-29 DIAGNOSIS — C8589 Other specified types of non-Hodgkin lymphoma, extranodal and solid organ sites: Secondary | ICD-10-CM

## 2013-11-29 DIAGNOSIS — Z5111 Encounter for antineoplastic chemotherapy: Secondary | ICD-10-CM

## 2013-11-29 MED ORDER — HEPARIN SOD (PORK) LOCK FLUSH 100 UNIT/ML IV SOLN
500.0000 [IU] | Freq: Once | INTRAVENOUS | Status: AC | PRN
  Administered 2013-11-29: 500 [IU]
  Filled 2013-11-29: qty 5

## 2013-11-29 MED ORDER — SODIUM CHLORIDE 0.9 % IV SOLN
72.0000 mg/m2 | Freq: Once | INTRAVENOUS | Status: AC
  Administered 2013-11-29: 135 mg via INTRAVENOUS
  Filled 2013-11-29: qty 1.5

## 2013-11-29 MED ORDER — SODIUM CHLORIDE 0.9 % IV SOLN
Freq: Once | INTRAVENOUS | Status: AC
  Administered 2013-11-29: 11:00:00 via INTRAVENOUS

## 2013-11-29 MED ORDER — DEXAMETHASONE SODIUM PHOSPHATE 10 MG/ML IJ SOLN
INTRAMUSCULAR | Status: AC
  Filled 2013-11-29: qty 1

## 2013-11-29 MED ORDER — DEXAMETHASONE SODIUM PHOSPHATE 10 MG/ML IJ SOLN
10.0000 mg | Freq: Once | INTRAMUSCULAR | Status: AC
  Administered 2013-11-29: 10 mg via INTRAVENOUS

## 2013-11-29 MED ORDER — SODIUM CHLORIDE 0.9 % IJ SOLN
10.0000 mL | INTRAMUSCULAR | Status: DC | PRN
  Administered 2013-11-29: 10 mL
  Filled 2013-11-29: qty 10

## 2013-11-29 MED ORDER — ONDANSETRON 8 MG/50ML IVPB (CHCC)
8.0000 mg | Freq: Once | INTRAVENOUS | Status: AC
  Administered 2013-11-29: 8 mg via INTRAVENOUS

## 2013-11-29 NOTE — Patient Instructions (Signed)
Bendamustine Injection What is this medicine? BENDAMUSTINE (BEN da MUS teen) is a chemotherapy drug. It is used to treat chronic lymphocytic leukemia and non-Hodgkin lymphoma. This medicine may be used for other purposes; ask your health care provider or pharmacist if you have questions. COMMON BRAND NAME(S): Treanda What should I tell my health care provider before I take this medicine? They need to know if you have any of these conditions: -kidney disease -liver disease -an unusual or allergic reaction to bendamustine, mannitol, other medicines, foods, dyes, or preservatives -pregnant or trying to get pregnant -breast-feeding How should I use this medicine? This medicine is for infusion into a vein. It is given by a health care professional in a hospital or clinic setting. Talk to your pediatrician regarding the use of this medicine in children. Special care may be needed. Overdosage: If you think you have taken too much of this medicine contact a poison control center or emergency room at once. NOTE: This medicine is only for you. Do not share this medicine with others. What if I miss a dose? It is important not to miss your dose. Call your doctor or health care professional if you are unable to keep an appointment. What may interact with this medicine? Do not take this medicine with any of the following medications: -clozapine This medicine may also interact with the following medications: -atazanavir -cimetidine -ciprofloxacin -enoxacin -fluvoxamine -medicines for seizures like carbamazepine and phenobarbital -mexiletine -rifampin -tacrine -thiabendazole -zileuton This list may not describe all possible interactions. Give your health care provider a list of all the medicines, herbs, non-prescription drugs, or dietary supplements you use. Also tell them if you smoke, drink alcohol, or use illegal drugs. Some items may interact with your medicine. What should I watch for while  using this medicine? Your condition will be monitored carefully while you are receiving this medicine. This drug may make you feel generally unwell. This is not uncommon, as chemotherapy can affect healthy cells as well as cancer cells. Report any side effects. Continue your course of treatment even though you feel ill unless your doctor tells you to stop. Call your doctor or health care professional for advice if you get a fever, chills or sore throat, or other symptoms of a cold or flu. Do not treat yourself. This drug decreases your body's ability to fight infections. Try to avoid being around people who are sick. This medicine may increase your risk to bruise or bleed. Call your doctor or health care professional if you notice any unusual bleeding. Be careful brushing and flossing your teeth or using a toothpick because you may get an infection or bleed more easily. If you have any dental work done, tell your dentist you are receiving this medicine. Avoid taking products that contain aspirin, acetaminophen, ibuprofen, naproxen, or ketoprofen unless instructed by your doctor. These medicines may hide a fever. Do not become pregnant while taking this medicine. Women should inform their doctor if they wish to become pregnant or think they might be pregnant. There is a potential for serious side effects to an unborn child. Men should inform their doctors if they wish to father a child. This medicine may lower sperm counts. Talk to your health care professional or pharmacist for more information. Do not breast-feed an infant while taking this medicine. What side effects may I notice from receiving this medicine? Side effects that you should report to your doctor or health care professional as soon as possible: -allergic reactions like skin rash,   itching or hives, swelling of the face, lips, or tongue -low blood counts - this medicine may decrease the number of white blood cells, red blood cells and  platelets. You may be at increased risk for infections and bleeding. -signs of infection - fever or chills, cough, sore throat, pain or difficulty passing urine -signs of decreased platelets or bleeding - bruising, pinpoint red spots on the skin, black, tarry stools, blood in the urine -signs of decreased red blood cells - unusually weak or tired, fainting spells, lightheadedness -trouble passing urine or change in the amount of urine Side effects that usually do not require medical attention (report to your doctor or health care professional if they continue or are bothersome): -diarrhea This list may not describe all possible side effects. Call your doctor for medical advice about side effects. You may report side effects to FDA at 1-800-FDA-1088. Where should I keep my medicine? This drug is given in a hospital or clinic and will not be stored at home. NOTE: This sheet is a summary. It may not cover all possible information. If you have questions about this medicine, talk to your doctor, pharmacist, or health care provider.  2015, [employer] Standard. (2011-07-08 14:15:47)  

## 2013-12-18 ENCOUNTER — Encounter (HOSPITAL_COMMUNITY): Payer: Self-pay

## 2013-12-18 ENCOUNTER — Encounter (HOSPITAL_COMMUNITY)
Admission: RE | Admit: 2013-12-18 | Discharge: 2013-12-18 | Disposition: A | Discharge status: Home / Self Care | Payer: Medicare Other | Source: Ambulatory Visit | Attending: Hematology & Oncology | Admitting: Hematology & Oncology

## 2013-12-18 DIAGNOSIS — M7989 Other specified soft tissue disorders: Secondary | ICD-10-CM | POA: Diagnosis not present

## 2013-12-18 DIAGNOSIS — C8590 Non-Hodgkin lymphoma, unspecified, unspecified site: Secondary | ICD-10-CM

## 2013-12-18 DIAGNOSIS — C8589 Other specified types of non-Hodgkin lymphoma, extranodal and solid organ sites: Secondary | ICD-10-CM | POA: Diagnosis not present

## 2013-12-18 LAB — GLUCOSE, CAPILLARY: Glucose-Capillary: 83 mg/dL (ref 70–99)

## 2013-12-18 MED ORDER — FLUDEOXYGLUCOSE F - 18 (FDG) INJECTION: 8.4200 | Freq: Once | INTRAVENOUS | Status: AC | PRN

## 2013-12-26 ENCOUNTER — Ambulatory Visit (HOSPITAL_BASED_OUTPATIENT_CLINIC_OR_DEPARTMENT_OTHER): Payer: Medicare Other

## 2013-12-26 ENCOUNTER — Ambulatory Visit (HOSPITAL_BASED_OUTPATIENT_CLINIC_OR_DEPARTMENT_OTHER): Payer: Medicare Other | Admitting: Hematology & Oncology

## 2013-12-26 ENCOUNTER — Other Ambulatory Visit (HOSPITAL_BASED_OUTPATIENT_CLINIC_OR_DEPARTMENT_OTHER): Payer: Medicare Other | Admitting: Lab

## 2013-12-26 ENCOUNTER — Encounter: Payer: Self-pay | Admitting: Hematology & Oncology

## 2013-12-26 VITALS — BP 142/60 | HR 68 | Temp 97.2°F | Resp 16 | Ht 60.0 in | Wt 171.0 lb

## 2013-12-26 DIAGNOSIS — C8590 Non-Hodgkin lymphoma, unspecified, unspecified site: Secondary | ICD-10-CM

## 2013-12-26 DIAGNOSIS — Z5111 Encounter for antineoplastic chemotherapy: Secondary | ICD-10-CM

## 2013-12-26 DIAGNOSIS — C8299 Follicular lymphoma, unspecified, extranodal and solid organ sites: Secondary | ICD-10-CM

## 2013-12-26 DIAGNOSIS — Z5112 Encounter for antineoplastic immunotherapy: Secondary | ICD-10-CM

## 2013-12-26 LAB — CBC WITH DIFFERENTIAL (CANCER CENTER ONLY)
BASO#: 0 10*3/uL (ref 0.0–0.2)
BASO%: 1 % (ref 0.0–2.0)
EOS%: 3.7 % (ref 0.0–7.0)
Eosinophils Absolute: 0.1 10*3/uL (ref 0.0–0.5)
HEMATOCRIT: 33.4 % — AB (ref 34.8–46.6)
HEMOGLOBIN: 11.1 g/dL — AB (ref 11.6–15.9)
LYMPH#: 0.8 10*3/uL — ABNORMAL LOW (ref 0.9–3.3)
LYMPH%: 27.9 % (ref 14.0–48.0)
MCH: 28.6 pg (ref 26.0–34.0)
MCHC: 33.2 g/dL (ref 32.0–36.0)
MCV: 86 fL (ref 81–101)
MONO#: 0.9 10*3/uL (ref 0.1–0.9)
MONO%: 28.9 % — ABNORMAL HIGH (ref 0.0–13.0)
NEUT#: 1.2 10*3/uL — ABNORMAL LOW (ref 1.5–6.5)
NEUT%: 38.5 % — ABNORMAL LOW (ref 39.6–80.0)
Platelets: 136 10*3/uL — ABNORMAL LOW (ref 145–400)
RBC: 3.88 10*6/uL (ref 3.70–5.32)
RDW: 17.7 % — ABNORMAL HIGH (ref 11.1–15.7)
WBC: 3 10*3/uL — ABNORMAL LOW (ref 3.9–10.0)

## 2013-12-26 LAB — CMP (CANCER CENTER ONLY)
ALBUMIN: 3.3 g/dL (ref 3.3–5.5)
ALT: 8 U/L — AB (ref 10–47)
AST: 22 U/L (ref 11–38)
Alkaline Phosphatase: 63 U/L (ref 26–84)
BUN, Bld: 12 mg/dL (ref 7–22)
CALCIUM: 8.9 mg/dL (ref 8.0–10.3)
CO2: 25 meq/L (ref 18–33)
Chloride: 102 mEq/L (ref 98–108)
Creat: 0.7 mg/dl (ref 0.6–1.2)
Glucose, Bld: 94 mg/dL (ref 73–118)
POTASSIUM: 3.8 meq/L (ref 3.3–4.7)
SODIUM: 138 meq/L (ref 128–145)
TOTAL PROTEIN: 6 g/dL — AB (ref 6.4–8.1)
Total Bilirubin: 0.5 mg/dl (ref 0.20–1.60)

## 2013-12-26 LAB — LACTATE DEHYDROGENASE: LDH: 153 U/L (ref 94–250)

## 2013-12-26 MED ORDER — ACETAMINOPHEN 325 MG PO TABS
650.0000 mg | ORAL_TABLET | Freq: Once | ORAL | Status: AC
  Administered 2013-12-26: 650 mg via ORAL

## 2013-12-26 MED ORDER — DIPHENHYDRAMINE HCL 25 MG PO CAPS
50.0000 mg | ORAL_CAPSULE | Freq: Once | ORAL | Status: AC
  Administered 2013-12-26: 50 mg via ORAL

## 2013-12-26 MED ORDER — DIPHENHYDRAMINE HCL 25 MG PO CAPS
ORAL_CAPSULE | ORAL | Status: AC
  Filled 2013-12-26: qty 2

## 2013-12-26 MED ORDER — DEXAMETHASONE SODIUM PHOSPHATE 20 MG/5ML IJ SOLN
INTRAMUSCULAR | Status: AC
  Filled 2013-12-26: qty 5

## 2013-12-26 MED ORDER — DEXAMETHASONE SODIUM PHOSPHATE 10 MG/ML IJ SOLN
10.0000 mg | Freq: Once | INTRAMUSCULAR | Status: AC
  Administered 2013-12-26: 10 mg via INTRAVENOUS

## 2013-12-26 MED ORDER — ONDANSETRON 8 MG/50ML IVPB (CHCC)
8.0000 mg | Freq: Once | INTRAVENOUS | Status: AC
  Administered 2013-12-26 (×2): 8 mg via INTRAVENOUS

## 2013-12-26 MED ORDER — SODIUM CHLORIDE 0.9 % IV SOLN
Freq: Once | INTRAVENOUS | Status: AC
  Administered 2013-12-26: 10:00:00 via INTRAVENOUS

## 2013-12-26 MED ORDER — SODIUM CHLORIDE 0.9 % IV SOLN
375.0000 mg/m2 | Freq: Once | INTRAVENOUS | Status: AC
  Administered 2013-12-26: 700 mg via INTRAVENOUS
  Filled 2013-12-26: qty 70

## 2013-12-26 MED ORDER — SODIUM CHLORIDE 0.9 % IV SOLN
72.0000 mg/m2 | Freq: Once | INTRAVENOUS | Status: AC
  Administered 2013-12-26: 135 mg via INTRAVENOUS
  Filled 2013-12-26: qty 1.5

## 2013-12-26 MED ORDER — ACETAMINOPHEN 325 MG PO TABS
ORAL_TABLET | ORAL | Status: AC
  Filled 2013-12-26: qty 2

## 2013-12-26 NOTE — Patient Instructions (Signed)
[facility] Discharge Instructions for Patients Receiving Chemotherapy  Today you received the following chemotherapy agents Rituxan, Treanda  Take your anti-nausea as directed.   If you develop nausea and vomiting that is not controlled by your nausea medication, call the clinic.   BELOW ARE SYMPTOMS THAT SHOULD BE REPORTED IMMEDIATELY:  *FEVER GREATER THAN 100.5 F  *CHILLS WITH OR WITHOUT FEVER  NAUSEA AND VOMITING THAT IS NOT CONTROLLED WITH YOUR NAUSEA MEDICATION  *UNUSUAL SHORTNESS OF BREATH  *UNUSUAL BRUISING OR BLEEDING  TENDERNESS IN MOUTH AND THROAT WITH OR WITHOUT PRESENCE OF ULCERS  *URINARY PROBLEMS  *BOWEL PROBLEMS  UNUSUAL RASH Items with * indicate a potential emergency and should be followed up as soon as possible.  Feel free to call the clinic you have any questions or concerns. The clinic phone number is [phone].

## 2013-12-26 NOTE — Progress Notes (Signed)
Hematology and Oncology Follow Up Visit  Kathrene Sinopoli [id number] 1922-12-23 80 y.o. 12/26/2013   Principle Diagnosis:   Follicular large cell non-Hodgkin's lymphoma  Current Therapy:    Status post 2 cycles of Rituxan/bendamustine     Interim History:  Ms.  Barbara is back for followup. We went ahead and did a PET scan on her. This was done at her second cycle of treatment. Thankfully, the PET scan did not show any evidence of residual disease. As such, she would be considered a remission that right now.  She looks great. She's gained some weight. Her appetite is good. Her colostomy is working well.. She's had no cough. She's had no rashes. She's really tolerated chemotherapy incredibly well.  Overall, her performance status is ECOG 1.  Medications: Current outpatient prescriptions:acetaminophen (TYLENOL) 500 MG tablet, Take 500 mg by mouth every 6 (six) hours as needed for mild pain., Disp: , Rfl: ;  amLODipine (NORVASC) 5 MG tablet, Take 5 mg by mouth every morning. , Disp: , Rfl: ;  belladonna-PHENObarbital (DONNATAL) 16.2 MG/5ML ELIX, Take 5 mLs (16.2 mg total) by mouth 3 (three) times daily., Disp: 480 mL, Rfl: 2 carvedilol (COREG) 3.125 MG tablet, Take 3.125 mg by mouth daily. , Disp: , Rfl: ;  Cholecalciferol (VITAMIN D3) 2000 UNITS TABS, Take by mouth every morning., Disp: , Rfl: ;  cycloSPORINE (RESTASIS) 0.05 % ophthalmic emulsion, Place 1 drop into both eyes 2 (two) times daily. , Disp: , Rfl: ;  docusate sodium (COLACE) 100 MG capsule, Take 100 mg by mouth daily. , Disp: , Rfl:  ezetimibe (ZETIA) 10 MG tablet, Take 10 mg by mouth every morning. , Disp: , Rfl: ;  furosemide (LASIX) 20 MG tablet, Take 20 mg by mouth every morning. , Disp: , Rfl: ;  levothyroxine (SYNTHROID, LEVOTHROID) 75 MCG tablet, Take 75 mcg by mouth daily before breakfast., Disp: , Rfl: ;  lisinopril (PRINIVIL,ZESTRIL) 20 MG tablet, Take 20 mg by mouth 2 (two) times daily., Disp: , Rfl:  Multiple Vitamin  (MULTIVITAMIN WITH MINERALS) TABS tablet, Take 1 tablet by mouth daily., Disp: , Rfl: ;  ondansetron (ZOFRAN) 8 MG tablet, Take 1 tablet (8 mg total) by mouth 2 (two) times daily. For 4 days following chemo, Disp: 20 tablet, Rfl: 2;  pantoprazole (PROTONIX) 40 MG tablet, Take 40 mg by mouth daily., Disp: , Rfl:  prochlorperazine (COMPAZINE) 10 MG tablet, Take 0.5 tablets (5 mg total) by mouth every 6 (six) hours as needed for nausea or vomiting., Disp: 30 tablet, Rfl: 3;  rOPINIRole (REQUIP) 1 MG tablet, Take 1 mg by mouth at bedtime., Disp: , Rfl: ;  sertraline (ZOLOFT) 25 MG tablet, Take 25 mg by mouth every morning. , Disp: , Rfl: ;  simvastatin (ZOCOR) 20 MG tablet, Take 20 mg by mouth every evening. , Disp: , Rfl:  Tiotropium Bromide Monohydrate (SPIRIVA RESPIMAT) 2.5 MCG/ACT AERS, Inhale 1 puff into the lungs daily., Disp: , Rfl: ;  traMADol (ULTRAM) 50 MG tablet, Take 50 mg by mouth every 6 (six) hours as needed for moderate pain., Disp: , Rfl:   Allergies:  Allergies  Allergen Reactions  . Epipen [Epinephrine Hcl]     Heart palpitations  . Heparin     Unsure of side affects (maybe affected platelets per pt)  . Levaquin [Levofloxacin In D5w] Other (See Comments)    Weakness  . Sulfa Antibiotics Rash    Past Medical History, Surgical history, Social history, and Family History were reviewed and updated.  Review of Systems: As above  Physical Exam:  height is 5' (1.524 m) and weight is 171 lb (77.565 kg). Her oral temperature is 97.2 F (36.2 C). Her blood pressure is 142/60 and her pulse is 68. Her respiration is 16.   Elderly white female. Head and neck exam shows no ocular or oral lesions. There are no palpable cervical or supraclavicular lymph nodes. Lungs are clear bilaterally. Cardiac exam regular in rhythm with no murmurs rubs or bruits. Abdomen is soft. She has good bowel sounds. The ostomy is intact. There is no stool in the ostomy bag. There is no palpable liver or spleen tip.  Her laparotomy scars are well-healed. Back exam no tenderness over the spine ribs or hips. Extremities shows no clubbing cyanosis or edema. Skin exam no rashes.   Lab Results  Component Value Date   WBC 3.0* 12/26/2013   HGB 11.1* 12/26/2013   HCT 33.4* 12/26/2013   MCV 86 12/26/2013   PLT 136* 12/26/2013     Chemistry      Component Value Date/Time   NA 138 12/26/2013 0822   NA 134* 09/25/2013 1141   K 3.8 12/26/2013 0822   K 4.1 09/25/2013 1141   CL 102 12/26/2013 0822   CL 98 09/25/2013 1141   CO2 25 12/26/2013 0822   CO2 25 09/25/2013 1141   BUN 12 12/26/2013 0822   BUN 13 09/25/2013 1141   CREATININE 0.7 12/26/2013 0822   CREATININE 0.61 09/25/2013 1141      Component Value Date/Time   CALCIUM 8.9 12/26/2013 0822   CALCIUM 9.4 09/25/2013 1141   ALKPHOS 63 12/26/2013 0822   ALKPHOS 90 09/25/2013 1141   AST 22 12/26/2013 0822   AST 15 09/25/2013 1141   ALT 8* 12/26/2013 0822   ALT <8 09/25/2013 1141   BILITOT 0.50 12/26/2013 0822   BILITOT 0.4 09/25/2013 1141         Impression and Plan: Ms. Malanowski is 80 year old female. She is [place] large cell lymphoma. This is of the abdomen. She's had 2 cycles of chemotherapy. She's done incredibly well. Her PET scan looks wonderful.  I think, at this point in time, we can probably just do 6 cycles of treatment. There we we'll go ahead with her third cycle of chemotherapy today.  I went over the PET scan with her. I went over her lab work. I spent about a half hour with her. She is very happy with the results. She certainly has a very strong faith and this is being reported.   [name], MD 9/3/20156:32 PM

## 2013-12-27 ENCOUNTER — Ambulatory Visit (HOSPITAL_BASED_OUTPATIENT_CLINIC_OR_DEPARTMENT_OTHER): Payer: Medicare Other

## 2013-12-27 VITALS — BP 176/74 | HR 67 | Temp 97.4°F | Resp 16

## 2013-12-27 DIAGNOSIS — C8589 Other specified types of non-Hodgkin lymphoma, extranodal and solid organ sites: Secondary | ICD-10-CM

## 2013-12-27 DIAGNOSIS — C8590 Non-Hodgkin lymphoma, unspecified, unspecified site: Secondary | ICD-10-CM

## 2013-12-27 DIAGNOSIS — Z5111 Encounter for antineoplastic chemotherapy: Secondary | ICD-10-CM

## 2013-12-27 MED ORDER — DEXAMETHASONE SODIUM PHOSPHATE 20 MG/5ML IJ SOLN
INTRAMUSCULAR | Status: AC
  Filled 2013-12-27: qty 5

## 2013-12-27 MED ORDER — SODIUM CHLORIDE 0.9 % IV SOLN
Freq: Once | INTRAVENOUS | Status: AC
  Administered 2013-12-27: 11:00:00 via INTRAVENOUS

## 2013-12-27 MED ORDER — SODIUM CHLORIDE 0.9 % IV SOLN
72.0000 mg/m2 | Freq: Once | INTRAVENOUS | Status: AC
  Administered 2013-12-27: 135 mg via INTRAVENOUS
  Filled 2013-12-27: qty 1.5

## 2013-12-27 MED ORDER — HEPARIN SOD (PORK) LOCK FLUSH 100 UNIT/ML IV SOLN
500.0000 [IU] | Freq: Once | INTRAVENOUS | Status: DC | PRN
  Filled 2013-12-27: qty 5

## 2013-12-27 MED ORDER — ONDANSETRON 8 MG/50ML IVPB (CHCC)
8.0000 mg | Freq: Once | INTRAVENOUS | Status: AC
  Administered 2013-12-27: 8 mg via INTRAVENOUS

## 2013-12-27 MED ORDER — DEXAMETHASONE SODIUM PHOSPHATE 10 MG/ML IJ SOLN
10.0000 mg | Freq: Once | INTRAMUSCULAR | Status: AC
  Administered 2013-12-27: 10 mg via INTRAVENOUS

## 2013-12-27 MED ORDER — SODIUM CHLORIDE 0.9 % IJ SOLN
10.0000 mL | INTRAMUSCULAR | Status: DC | PRN
  Filled 2013-12-27: qty 10

## 2013-12-27 NOTE — Patient Instructions (Signed)
[facility] Discharge Instructions for Patients Receiving Chemotherapy  Today you received the following chemotherapy agents Treanda  To help prevent nausea and vomiting after your treatment, we encourage you to take your nausea medication as ordered   If you develop nausea and vomiting that is not controlled by your nausea medication, call the clinic.   BELOW ARE SYMPTOMS THAT SHOULD BE REPORTED IMMEDIATELY:  *FEVER GREATER THAN 100.5 F  *CHILLS WITH OR WITHOUT FEVER  NAUSEA AND VOMITING THAT IS NOT CONTROLLED WITH YOUR NAUSEA MEDICATION  *UNUSUAL SHORTNESS OF BREATH  *UNUSUAL BRUISING OR BLEEDING  TENDERNESS IN MOUTH AND THROAT WITH OR WITHOUT PRESENCE OF ULCERS  *URINARY PROBLEMS  *BOWEL PROBLEMS  UNUSUAL RASH Items with * indicate a potential emergency and should be followed up as soon as possible.  Feel free to call the clinic you have any questions or concerns. The clinic phone number is [phone].

## 2014-01-23 ENCOUNTER — Other Ambulatory Visit: Payer: Self-pay

## 2014-01-23 ENCOUNTER — Encounter: Payer: Self-pay | Admitting: Hematology & Oncology

## 2014-01-23 ENCOUNTER — Other Ambulatory Visit (HOSPITAL_BASED_OUTPATIENT_CLINIC_OR_DEPARTMENT_OTHER): Payer: Medicare Other | Admitting: Lab

## 2014-01-23 ENCOUNTER — Ambulatory Visit (HOSPITAL_BASED_OUTPATIENT_CLINIC_OR_DEPARTMENT_OTHER): Payer: Medicare Other | Admitting: Hematology & Oncology

## 2014-01-23 ENCOUNTER — Encounter (INDEPENDENT_AMBULATORY_CARE_PROVIDER_SITE_OTHER): Payer: Self-pay

## 2014-01-23 ENCOUNTER — Other Ambulatory Visit: Payer: Medicare Other | Admitting: Lab

## 2014-01-23 ENCOUNTER — Ambulatory Visit (HOSPITAL_BASED_OUTPATIENT_CLINIC_OR_DEPARTMENT_OTHER): Payer: Medicare Other

## 2014-01-23 ENCOUNTER — Ambulatory Visit: Payer: Medicare Other

## 2014-01-23 ENCOUNTER — Ambulatory Visit: Payer: Medicare Other | Admitting: Hematology & Oncology

## 2014-01-23 VITALS — BP 132/58 | HR 71 | Temp 97.5°F | Resp 18 | Wt 167.0 lb

## 2014-01-23 VITALS — BP 121/42 | HR 60 | Temp 97.8°F | Resp 16

## 2014-01-23 DIAGNOSIS — C8290 Follicular lymphoma, unspecified, unspecified site: Secondary | ICD-10-CM | POA: Diagnosis not present

## 2014-01-23 DIAGNOSIS — Z5111 Encounter for antineoplastic chemotherapy: Secondary | ICD-10-CM

## 2014-01-23 DIAGNOSIS — C8590 Non-Hodgkin lymphoma, unspecified, unspecified site: Secondary | ICD-10-CM

## 2014-01-23 DIAGNOSIS — Z1231 Encounter for screening mammogram for malignant neoplasm of breast: Secondary | ICD-10-CM

## 2014-01-23 DIAGNOSIS — Z5112 Encounter for antineoplastic immunotherapy: Secondary | ICD-10-CM | POA: Diagnosis not present

## 2014-01-23 LAB — CBC WITH DIFFERENTIAL (CANCER CENTER ONLY)
BASO#: 0.1 10*3/uL (ref 0.0–0.2)
BASO%: 2 % (ref 0.0–2.0)
EOS%: 5.9 % (ref 0.0–7.0)
Eosinophils Absolute: 0.2 10*3/uL (ref 0.0–0.5)
HCT: 36.1 % (ref 34.8–46.6)
HEMOGLOBIN: 11.9 g/dL (ref 11.6–15.9)
LYMPH#: 1 10*3/uL (ref 0.9–3.3)
LYMPH%: 32.1 % (ref 14.0–48.0)
MCH: 29.4 pg (ref 26.0–34.0)
MCHC: 33 g/dL (ref 32.0–36.0)
MCV: 89 fL (ref 81–101)
MONO#: 1 10*3/uL — ABNORMAL HIGH (ref 0.1–0.9)
MONO%: 33.8 % — AB (ref 0.0–13.0)
NEUT#: 0.8 10*3/uL — ABNORMAL LOW (ref 1.5–6.5)
NEUT%: 26.2 % — ABNORMAL LOW (ref 39.6–80.0)
PLATELETS: 103 10*3/uL — AB (ref 145–400)
RBC: 4.05 10*6/uL (ref 3.70–5.32)
RDW: 17.2 % — ABNORMAL HIGH (ref 11.1–15.7)
WBC: 3.1 10*3/uL — ABNORMAL LOW (ref 3.9–10.0)

## 2014-01-23 LAB — CMP (CANCER CENTER ONLY)
ALK PHOS: 58 U/L (ref 26–84)
ALT: 12 U/L (ref 10–47)
AST: 27 U/L (ref 11–38)
Albumin: 3.4 g/dL (ref 3.3–5.5)
BUN: 11 mg/dL (ref 7–22)
CALCIUM: 9.1 mg/dL (ref 8.0–10.3)
CO2: 24 mEq/L (ref 18–33)
CREATININE: 0.7 mg/dL (ref 0.6–1.2)
Chloride: 105 mEq/L (ref 98–108)
Glucose, Bld: 92 mg/dL (ref 73–118)
POTASSIUM: 3 meq/L — AB (ref 3.3–4.7)
Sodium: 137 mEq/L (ref 128–145)
Total Bilirubin: 0.4 mg/dl (ref 0.20–1.60)
Total Protein: 6.3 g/dL — ABNORMAL LOW (ref 6.4–8.1)

## 2014-01-23 MED ORDER — SODIUM CHLORIDE 0.9 % IJ SOLN
10.0000 mL | INTRAMUSCULAR | Status: DC | PRN
  Filled 2014-01-23: qty 10

## 2014-01-23 MED ORDER — DEXAMETHASONE SODIUM PHOSPHATE 10 MG/ML IJ SOLN
10.0000 mg | Freq: Once | INTRAMUSCULAR | Status: AC
  Administered 2014-01-23: 10 mg via INTRAVENOUS

## 2014-01-23 MED ORDER — ACETAMINOPHEN 325 MG PO TABS
650.0000 mg | ORAL_TABLET | Freq: Once | ORAL | Status: AC
  Administered 2014-01-23: 650 mg via ORAL

## 2014-01-23 MED ORDER — DIPHENHYDRAMINE HCL 25 MG PO CAPS
ORAL_CAPSULE | ORAL | Status: AC
  Filled 2014-01-23: qty 1

## 2014-01-23 MED ORDER — POTASSIUM CHLORIDE CRYS ER 20 MEQ PO TBCR: 20.0000 meq | EXTENDED_RELEASE_TABLET | Freq: Every day | ORAL | Status: DC

## 2014-01-23 MED ORDER — ONDANSETRON 8 MG/50ML IVPB (CHCC)
8.0000 mg | Freq: Once | INTRAVENOUS | Status: AC
  Administered 2014-01-23: 8 mg via INTRAVENOUS

## 2014-01-23 MED ORDER — DIPHENHYDRAMINE HCL 25 MG PO CAPS
50.0000 mg | ORAL_CAPSULE | Freq: Once | ORAL | Status: AC
  Administered 2014-01-23: 50 mg via ORAL

## 2014-01-23 MED ORDER — DEXAMETHASONE SODIUM PHOSPHATE 20 MG/5ML IJ SOLN
INTRAMUSCULAR | Status: AC
  Filled 2014-01-23: qty 5

## 2014-01-23 MED ORDER — SODIUM CHLORIDE 0.9 % IV SOLN
Freq: Once | INTRAVENOUS | Status: AC
  Administered 2014-01-23: 09:00:00 via INTRAVENOUS

## 2014-01-23 MED ORDER — SODIUM CHLORIDE 0.9 % IV SOLN
375.0000 mg/m2 | Freq: Once | INTRAVENOUS | Status: AC
  Administered 2014-01-23: 700 mg via INTRAVENOUS
  Filled 2014-01-23: qty 70

## 2014-01-23 MED ORDER — ACETAMINOPHEN 325 MG PO TABS
ORAL_TABLET | ORAL | Status: AC
  Filled 2014-01-23: qty 2

## 2014-01-23 MED ORDER — HEPARIN SOD (PORK) LOCK FLUSH 100 UNIT/ML IV SOLN
500.0000 [IU] | Freq: Once | INTRAVENOUS | Status: DC | PRN
  Filled 2014-01-23: qty 5

## 2014-01-23 MED ORDER — SODIUM CHLORIDE 0.9 % IV SOLN
72.0000 mg/m2 | Freq: Once | INTRAVENOUS | Status: AC
  Administered 2014-01-23: 135 mg via INTRAVENOUS
  Filled 2014-01-23: qty 1.5

## 2014-01-23 NOTE — Progress Notes (Signed)
Dr Marin Olp aware that [place] was 0.8 today. Dr Marin Olp is okay to continue with chemo treatment today because "the patient's MONO% is 33.8."

## 2014-01-23 NOTE — Patient Instructions (Signed)
Bendamustine Injection What is this medicine? BENDAMUSTINE (BEN da MUS teen) is a chemotherapy drug. It is used to treat chronic lymphocytic leukemia and non-Hodgkin lymphoma. This medicine may be used for other purposes; ask your health care provider or pharmacist if you have questions. COMMON BRAND NAME(S): Treanda What should I tell my health care provider before I take this medicine? They need to know if you have any of these conditions: -kidney disease -liver disease -an unusual or allergic reaction to bendamustine, mannitol, other medicines, foods, dyes, or preservatives -pregnant or trying to get pregnant -breast-feeding How should I use this medicine? This medicine is for infusion into a vein. It is given by a health care professional in a hospital or clinic setting. Talk to your pediatrician regarding the use of this medicine in children. Special care may be needed. Overdosage: If you think you have taken too much of this medicine contact a poison control center or emergency room at once. NOTE: This medicine is only for you. Do not share this medicine with others. What if I miss a dose? It is important not to miss your dose. Call your doctor or health care professional if you are unable to keep an appointment. What may interact with this medicine? Do not take this medicine with any of the following medications: -clozapine This medicine may also interact with the following medications: -atazanavir -cimetidine -ciprofloxacin -enoxacin -fluvoxamine -medicines for seizures like carbamazepine and phenobarbital -mexiletine -rifampin -tacrine -thiabendazole -zileuton This list may not describe all possible interactions. Give your health care provider a list of all the medicines, herbs, non-prescription drugs, or dietary supplements you use. Also tell them if you smoke, drink alcohol, or use illegal drugs. Some items may interact with your medicine. What should I watch for while  using this medicine? Your condition will be monitored carefully while you are receiving this medicine. This drug may make you feel generally unwell. This is not uncommon, as chemotherapy can affect healthy cells as well as cancer cells. Report any side effects. Continue your course of treatment even though you feel ill unless your doctor tells you to stop. Call your doctor or health care professional for advice if you get a fever, chills or sore throat, or other symptoms of a cold or flu. Do not treat yourself. This drug decreases your body's ability to fight infections. Try to avoid being around people who are sick. This medicine may increase your risk to bruise or bleed. Call your doctor or health care professional if you notice any unusual bleeding. Be careful brushing and flossing your teeth or using a toothpick because you may get an infection or bleed more easily. If you have any dental work done, tell your dentist you are receiving this medicine. Avoid taking products that contain aspirin, acetaminophen, ibuprofen, naproxen, or ketoprofen unless instructed by your doctor. These medicines may hide a fever. Do not become pregnant while taking this medicine. Women should inform their doctor if they wish to become pregnant or think they might be pregnant. There is a potential for serious side effects to an unborn child. Men should inform their doctors if they wish to father a child. This medicine may lower sperm counts. Talk to your health care professional or pharmacist for more information. Do not breast-feed an infant while taking this medicine. What side effects may I notice from receiving this medicine? Side effects that you should report to your doctor or health care professional as soon as possible: -allergic reactions like skin rash,   itching or hives, swelling of the face, lips, or tongue -low blood counts - this medicine may decrease the number of white blood cells, red blood cells and  platelets. You may be at increased risk for infections and bleeding. -signs of infection - fever or chills, cough, sore throat, pain or difficulty passing urine -signs of decreased platelets or bleeding - bruising, pinpoint red spots on the skin, black, tarry stools, blood in the urine -signs of decreased red blood cells - unusually weak or tired, fainting spells, lightheadedness -trouble passing urine or change in the amount of urine Side effects that usually do not require medical attention (report to your doctor or health care professional if they continue or are bothersome): -diarrhea This list may not describe all possible side effects. Call your doctor for medical advice about side effects. You may report side effects to FDA at 1-800-FDA-1088. Where should I keep my medicine? This drug is given in a hospital or clinic and will not be stored at home. NOTE: This sheet is a summary. It may not cover all possible information. If you have questions about this medicine, talk to your doctor, pharmacist, or health care provider.  2015, [employer] Standard. (2011-07-08 14:15:47) Rituximab injection What is this medicine? RITUXIMAB (ri TUX i mab) is a monoclonal antibody. This medicine changes the way the body's immune system works. It is used commonly to treat non-Hodgkin's lymphoma and other conditions. In cancer cells, this drug targets a specific protein within cancer cells and stops the cancer cells from growing. It is also used to treat rhuematoid arthritis (RA). In RA, this medicine slow the inflammatory process and help reduce joint pain and swelling. This medicine is often used with other cancer or arthritis medications. This medicine may be used for other purposes; ask your health care provider or pharmacist if you have questions. COMMON BRAND NAME(S): Rituxan What should I tell my health care provider before I take this medicine? They need to know if you have any of these  conditions: -blood disorders -heart disease -history of hepatitis B -infection (especially a virus infection such as chickenpox, cold sores, or herpes) -irregular heartbeat -kidney disease -lung or breathing disease, like asthma -lupus -an unusual or allergic reaction to rituximab, mouse proteins, other medicines, foods, dyes, or preservatives -pregnant or trying to get pregnant -breast-feeding How should I use this medicine? This medicine is for infusion into a vein. It is administered in a hospital or clinic by a specially trained health care professional. A special MedGuide will be given to you by the pharmacist with each prescription and refill. Be sure to read this information carefully each time. Talk to your pediatrician regarding the use of this medicine in children. This medicine is not approved for use in children. Overdosage: If you think you have taken too much of this medicine contact a poison control center or emergency room at once. NOTE: This medicine is only for you. Do not share this medicine with others. What if I miss a dose? It is important not to miss a dose. Call your doctor or health care professional if you are unable to keep an appointment. What may interact with this medicine? -cisplatin -medicines for blood pressure -some other medicines for arthritis -vaccines This list may not describe all possible interactions. Give your health care provider a list of all the medicines, herbs, non-prescription drugs, or dietary supplements you use. Also tell them if you smoke, drink alcohol, or use illegal drugs. Some items may interact with   your medicine. What should I watch for while using this medicine? Report any side effects that you notice during your treatment right away, such as changes in your breathing, fever, chills, dizziness or lightheadedness. These effects are more common with the first dose. Visit your prescriber or health care professional for checks on your  progress. You will need to have regular blood work. Report any other side effects. The side effects of this medicine can continue after you finish your treatment. Continue your course of treatment even though you feel ill unless your doctor tells you to stop. Call your doctor or health care professional for advice if you get a fever, chills or sore throat, or other symptoms of a cold or flu. Do not treat yourself. This drug decreases your body's ability to fight infections. Try to avoid being around people who are sick. This medicine may increase your risk to bruise or bleed. Call your doctor or health care professional if you notice any unusual bleeding. Be careful brushing and flossing your teeth or using a toothpick because you may get an infection or bleed more easily. If you have any dental work done, tell your dentist you are receiving this medicine. Avoid taking products that contain aspirin, acetaminophen, ibuprofen, naproxen, or ketoprofen unless instructed by your doctor. These medicines may hide a fever. Do not become pregnant while taking this medicine. Women should inform their doctor if they wish to become pregnant or think they might be pregnant. There is a potential for serious side effects to an unborn child. Talk to your health care professional or pharmacist for more information. Do not breast-feed an infant while taking this medicine. What side effects may I notice from receiving this medicine? Side effects that you should report to your doctor or health care professional as soon as possible: -allergic reactions like skin rash, itching or hives, swelling of the face, lips, or tongue -low blood counts - this medicine may decrease the number of white blood cells, red blood cells and platelets. You may be at increased risk for infections and bleeding. -signs of infection - fever or chills, cough, sore throat, pain or difficulty passing urine -signs of decreased platelets or bleeding -  bruising, pinpoint red spots on the skin, black, tarry stools, blood in the urine -signs of decreased red blood cells - unusually weak or tired, fainting spells, lightheadedness -breathing problems -confused, not responsive -chest pain -fast, irregular heartbeat -feeling faint or lightheaded, falls -mouth sores -redness, blistering, peeling or loosening of the skin, including inside the mouth -stomach pain -swelling of the ankles, feet, or hands -trouble passing urine or change in the amount of urine Side effects that usually do not require medical attention (report to your doctor or other health care professional if they continue or are bothersome): -anxiety -headache -loss of appetite -muscle aches -nausea -night sweats This list may not describe all possible side effects. Call your doctor for medical advice about side effects. You may report side effects to FDA at 1-800-FDA-1088. Where should I keep my medicine? This drug is given in a hospital or clinic and will not be stored at home. NOTE: This sheet is a summary. It may not cover all possible information. If you have questions about this medicine, talk to your doctor, pharmacist, or health care provider.  2015, [employer]/Gold Standard. (2007-12-10 14:04:59)  

## 2014-01-24 ENCOUNTER — Ambulatory Visit (HOSPITAL_BASED_OUTPATIENT_CLINIC_OR_DEPARTMENT_OTHER): Payer: Medicare Other

## 2014-01-24 VITALS — BP 155/54 | HR 63 | Temp 98.0°F | Resp 16

## 2014-01-24 DIAGNOSIS — C8590 Non-Hodgkin lymphoma, unspecified, unspecified site: Secondary | ICD-10-CM

## 2014-01-24 DIAGNOSIS — C8290 Follicular lymphoma, unspecified, unspecified site: Secondary | ICD-10-CM

## 2014-01-24 DIAGNOSIS — Z5111 Encounter for antineoplastic chemotherapy: Secondary | ICD-10-CM

## 2014-01-24 MED ORDER — SODIUM CHLORIDE 0.9 % IJ SOLN
10.0000 mL | INTRAMUSCULAR | Status: DC | PRN
  Administered 2014-01-24: 10 mL
  Filled 2014-01-24: qty 10

## 2014-01-24 MED ORDER — SODIUM CHLORIDE 0.9 % IJ SOLN
3.0000 mL | INTRAMUSCULAR | Status: DC | PRN
  Filled 2014-01-24: qty 10

## 2014-01-24 MED ORDER — HEPARIN SOD (PORK) LOCK FLUSH 100 UNIT/ML IV SOLN
500.0000 [IU] | Freq: Once | INTRAVENOUS | Status: AC | PRN
  Administered 2014-01-24: 500 [IU]
  Filled 2014-01-24: qty 5

## 2014-01-24 MED ORDER — HEPARIN SOD (PORK) LOCK FLUSH 100 UNIT/ML IV SOLN
250.0000 [IU] | Freq: Once | INTRAVENOUS | Status: DC | PRN
  Filled 2014-01-24: qty 5

## 2014-01-24 MED ORDER — SODIUM CHLORIDE 0.9 % IV SOLN
72.0000 mg/m2 | Freq: Once | INTRAVENOUS | Status: AC
  Administered 2014-01-24: 135 mg via INTRAVENOUS
  Filled 2014-01-24: qty 1.5

## 2014-01-24 MED ORDER — DEXAMETHASONE SODIUM PHOSPHATE 10 MG/ML IJ SOLN
10.0000 mg | Freq: Once | INTRAMUSCULAR | Status: AC
  Administered 2014-01-24: 10 mg via INTRAVENOUS

## 2014-01-24 MED ORDER — ALTEPLASE 2 MG IJ SOLR
2.0000 mg | Freq: Once | INTRAMUSCULAR | Status: DC | PRN
  Filled 2014-01-24: qty 2

## 2014-01-24 MED ORDER — ONDANSETRON 8 MG/50ML IVPB (CHCC)
8.0000 mg | Freq: Once | INTRAVENOUS | Status: AC
  Administered 2014-01-24: 8 mg via INTRAVENOUS

## 2014-01-24 MED ORDER — SODIUM CHLORIDE 0.9 % IV SOLN
Freq: Once | INTRAVENOUS | Status: AC
  Administered 2014-01-24: 12:00:00 via INTRAVENOUS

## 2014-01-24 NOTE — Progress Notes (Signed)
Hematology and Oncology Follow Up Visit  Makayla Fisher [id number] 11/28/1922 80 y.o. 01/24/2014   Principle Diagnosis:   Follicular large cell non-Hodgkin's lymphoma  Current Therapy:    Status post 3 cycles of Rituxan/bendamustine     Interim History:  [name] is back for followup. She is doing quite well. She will have no specific complaints. Her ostomy is working nicely. She is eating pretty good. She try to watch what she eats. She's had no nausea vomiting. She has a little bit of fatigue. I think this might be more so the chemotherapy than anything else.  She's had no bleeding. She's had no fever. She's had no rashes.   Overall, her performance status is ECOG 1.  Medications: Current outpatient prescriptions:acetaminophen (TYLENOL) 500 MG tablet, Take 500 mg by mouth every 6 (six) hours as needed for mild pain., Disp: , Rfl: ;  amLODipine (NORVASC) 5 MG tablet, Take 5 mg by mouth every morning. , Disp: , Rfl: ;  belladonna-PHENObarbital (DONNATAL) 16.2 MG/5ML ELIX, Take 5 mLs (16.2 mg total) by mouth 3 (three) times daily., Disp: 480 mL, Rfl: 2 carvedilol (COREG) 3.125 MG tablet, Take 3.125 mg by mouth daily. , Disp: , Rfl: ;  Cholecalciferol (VITAMIN D3) 2000 UNITS TABS, Take by mouth every morning., Disp: , Rfl: ;  cycloSPORINE (RESTASIS) 0.05 % ophthalmic emulsion, Place 1 drop into both eyes 2 (two) times daily. , Disp: , Rfl: ;  docusate sodium (COLACE) 100 MG capsule, Take 100 mg by mouth daily. , Disp: , Rfl:  ezetimibe (ZETIA) 10 MG tablet, Take 10 mg by mouth every morning. , Disp: , Rfl: ;  furosemide (LASIX) 20 MG tablet, Take 20 mg by mouth every morning. , Disp: , Rfl: ;  levothyroxine (SYNTHROID, LEVOTHROID) 75 MCG tablet, Take 75 mcg by mouth daily before breakfast., Disp: , Rfl: ;  lisinopril (PRINIVIL,ZESTRIL) 20 MG tablet, Take 20 mg by mouth 2 (two) times daily., Disp: , Rfl:  Multiple Vitamin (MULTIVITAMIN WITH MINERALS) TABS tablet, Take 1 tablet by mouth  daily., Disp: , Rfl: ;  ondansetron (ZOFRAN) 8 MG tablet, Take 1 tablet (8 mg total) by mouth 2 (two) times daily. For 4 days following chemo, Disp: 20 tablet, Rfl: 2;  pantoprazole (PROTONIX) 40 MG tablet, Take 40 mg by mouth daily., Disp: , Rfl:  potassium chloride SA (K-DUR,KLOR-CON) 20 MEQ tablet, Take 1 tablet (20 mEq total) by mouth daily., Disp: 30 tablet, Rfl: 2;  prochlorperazine (COMPAZINE) 10 MG tablet, Take 0.5 tablets (5 mg total) by mouth every 6 (six) hours as needed for nausea or vomiting., Disp: 30 tablet, Rfl: 3;  rOPINIRole (REQUIP) 1 MG tablet, Take 1 mg by mouth at bedtime., Disp: , Rfl:  sertraline (ZOLOFT) 25 MG tablet, Take 25 mg by mouth every morning. , Disp: , Rfl: ;  simvastatin (ZOCOR) 20 MG tablet, Take 20 mg by mouth every evening. , Disp: , Rfl: ;  Tiotropium Bromide Monohydrate (SPIRIVA RESPIMAT) 2.5 MCG/ACT AERS, Inhale 1 puff into the lungs daily., Disp: , Rfl: ;  traMADol (ULTRAM) 50 MG tablet, Take 50 mg by mouth every 6 (six) hours as needed for moderate pain., Disp: , Rfl:   Allergies:  Allergies  Allergen Reactions  . Epipen [Epinephrine Hcl]     Heart palpitations  . Heparin     Unsure of side affects (maybe affected platelets per pt)  . Levaquin [Levofloxacin In D5w] Other (See Comments)    Weakness  . Sulfa Antibiotics Rash  Past Medical History, Surgical history, Social history, and Family History were reviewed and updated.  Review of Systems: As above  Physical Exam:  weight is 167 lb (75.751 kg). Her temperature is 97.5 F (36.4 C). Her blood pressure is 132/58 and her pulse is 71. Her respiration is 18.   Elderly white female. Head and neck exam shows no ocular or oral lesions. There are no palpable cervical or supraclavicular lymph nodes. Lungs are clear bilaterally. Cardiac exam regular in rhythm with no murmurs rubs or bruits. Abdomen is soft. She has good bowel sounds. The ostomy is intact. There is no stool in the ostomy bag. There is no  palpable liver or spleen tip. Her laparotomy scars are well-healed. Back exam no tenderness over the spine ribs or hips. Extremities shows no clubbing cyanosis or edema. Skin exam no rashes.   Lab Results  Component Value Date   WBC 3.1* 01/23/2014   HGB 11.9 01/23/2014   HCT 36.1 01/23/2014   MCV 89 01/23/2014   PLT 103* 01/23/2014     Chemistry      Component Value Date/Time   NA 137 01/23/2014 0906   NA 134* 09/25/2013 1141   K 3.0* 01/23/2014 0906   K 4.1 09/25/2013 1141   CL 105 01/23/2014 0906   CL 98 09/25/2013 1141   CO2 24 01/23/2014 0906   CO2 25 09/25/2013 1141   BUN 11 01/23/2014 0906   BUN 13 09/25/2013 1141   CREATININE 0.7 01/23/2014 0906   CREATININE 0.61 09/25/2013 1141      Component Value Date/Time   CALCIUM 9.1 01/23/2014 0906   CALCIUM 9.4 09/25/2013 1141   ALKPHOS 58 01/23/2014 0906   ALKPHOS 90 09/25/2013 1141   AST 27 01/23/2014 0906   AST 15 09/25/2013 1141   ALT 12 01/23/2014 0906   ALT <8 09/25/2013 1141   BILITOT 0.40 01/23/2014 0906   BILITOT 0.4 09/25/2013 1141         Impression and Plan: Makayla Fisher is 80 year old female. She has a follicular large cell lymphoma. This is of the abdomen. She's had 3 cycles of chemotherapy. She's done incredibly well. Her PET scan looks wonderful.  I think, at this point in time, we can probably just do 6 cycles of treatment. we will  go ahead with her  fourth  cycle of chemotherapy today.  I'll plan to get her back in 4-5 weeks.    [name], MD 10/2/20157:34 AM

## 2014-01-24 NOTE — Patient Instructions (Signed)
Bendamustine Injection What is this medicine? BENDAMUSTINE (BEN da MUS teen) is a chemotherapy drug. It is used to treat chronic lymphocytic leukemia and non-Hodgkin lymphoma. This medicine may be used for other purposes; ask your health care provider or pharmacist if you have questions. COMMON BRAND NAME(S): Treanda What should I tell my health care provider before I take this medicine? They need to know if you have any of these conditions: -kidney disease -liver disease -an unusual or allergic reaction to bendamustine, mannitol, other medicines, foods, dyes, or preservatives -pregnant or trying to get pregnant -breast-feeding How should I use this medicine? This medicine is for infusion into a vein. It is given by a health care professional in a hospital or clinic setting. Talk to your pediatrician regarding the use of this medicine in children. Special care may be needed. Overdosage: If you think you have taken too much of this medicine contact a poison control center or emergency room at once. NOTE: This medicine is only for you. Do not share this medicine with others. What if I miss a dose? It is important not to miss your dose. Call your doctor or health care professional if you are unable to keep an appointment. What may interact with this medicine? Do not take this medicine with any of the following medications: -clozapine This medicine may also interact with the following medications: -atazanavir -cimetidine -ciprofloxacin -enoxacin -fluvoxamine -medicines for seizures like carbamazepine and phenobarbital -mexiletine -rifampin -tacrine -thiabendazole -zileuton This list may not describe all possible interactions. Give your health care provider a list of all the medicines, herbs, non-prescription drugs, or dietary supplements you use. Also tell them if you smoke, drink alcohol, or use illegal drugs. Some items may interact with your medicine. What should I watch for while  using this medicine? Your condition will be monitored carefully while you are receiving this medicine. This drug may make you feel generally unwell. This is not uncommon, as chemotherapy can affect healthy cells as well as cancer cells. Report any side effects. Continue your course of treatment even though you feel ill unless your doctor tells you to stop. Call your doctor or health care professional for advice if you get a fever, chills or sore throat, or other symptoms of a cold or flu. Do not treat yourself. This drug decreases your body's ability to fight infections. Try to avoid being around people who are sick. This medicine may increase your risk to bruise or bleed. Call your doctor or health care professional if you notice any unusual bleeding. Be careful brushing and flossing your teeth or using a toothpick because you may get an infection or bleed more easily. If you have any dental work done, tell your dentist you are receiving this medicine. Avoid taking products that contain aspirin, acetaminophen, ibuprofen, naproxen, or ketoprofen unless instructed by your doctor. These medicines may hide a fever. Do not become pregnant while taking this medicine. Women should inform their doctor if they wish to become pregnant or think they might be pregnant. There is a potential for serious side effects to an unborn child. Men should inform their doctors if they wish to father a child. This medicine may lower sperm counts. Talk to your health care professional or pharmacist for more information. Do not breast-feed an infant while taking this medicine. What side effects may I notice from receiving this medicine? Side effects that you should report to your doctor or health care professional as soon as possible: -allergic reactions like skin rash,   itching or hives, swelling of the face, lips, or tongue -low blood counts - this medicine may decrease the number of white blood cells, red blood cells and  platelets. You may be at increased risk for infections and bleeding. -signs of infection - fever or chills, cough, sore throat, pain or difficulty passing urine -signs of decreased platelets or bleeding - bruising, pinpoint red spots on the skin, black, tarry stools, blood in the urine -signs of decreased red blood cells - unusually weak or tired, fainting spells, lightheadedness -trouble passing urine or change in the amount of urine Side effects that usually do not require medical attention (report to your doctor or health care professional if they continue or are bothersome): -diarrhea This list may not describe all possible side effects. Call your doctor for medical advice about side effects. You may report side effects to FDA at 1-800-FDA-1088. Where should I keep my medicine? This drug is given in a hospital or clinic and will not be stored at home. NOTE: This sheet is a summary. It may not cover all possible information. If you have questions about this medicine, talk to your doctor, pharmacist, or health care provider.  2015, [employer] Standard. (2011-07-08 14:15:47)  

## 2014-02-04 ENCOUNTER — Ambulatory Visit
Admission: RE | Admit: 2014-02-04 | Discharge: 2014-02-04 | Disposition: A | Discharge status: Home / Self Care | Payer: Medicare Other | Source: Ambulatory Visit

## 2014-02-04 DIAGNOSIS — Z1231 Encounter for screening mammogram for malignant neoplasm of breast: Secondary | ICD-10-CM

## 2014-02-27 ENCOUNTER — Ambulatory Visit (HOSPITAL_BASED_OUTPATIENT_CLINIC_OR_DEPARTMENT_OTHER): Payer: Medicare Other | Admitting: Hematology & Oncology

## 2014-02-27 ENCOUNTER — Encounter: Payer: Self-pay | Admitting: Hematology & Oncology

## 2014-02-27 ENCOUNTER — Other Ambulatory Visit (HOSPITAL_BASED_OUTPATIENT_CLINIC_OR_DEPARTMENT_OTHER): Payer: Medicare Other | Admitting: Lab

## 2014-02-27 ENCOUNTER — Ambulatory Visit (HOSPITAL_BASED_OUTPATIENT_CLINIC_OR_DEPARTMENT_OTHER): Payer: Medicare Other

## 2014-02-27 VITALS — BP 141/55 | HR 69 | Temp 97.6°F | Resp 16 | Ht 62.0 in | Wt 161.0 lb

## 2014-02-27 DIAGNOSIS — C8290 Follicular lymphoma, unspecified, unspecified site: Secondary | ICD-10-CM

## 2014-02-27 DIAGNOSIS — C8590 Non-Hodgkin lymphoma, unspecified, unspecified site: Secondary | ICD-10-CM

## 2014-02-27 DIAGNOSIS — Z5112 Encounter for antineoplastic immunotherapy: Secondary | ICD-10-CM

## 2014-02-27 DIAGNOSIS — Z5111 Encounter for antineoplastic chemotherapy: Secondary | ICD-10-CM

## 2014-02-27 LAB — CMP (CANCER CENTER ONLY)
ALT(SGPT): 14 U/L (ref 10–47)
AST: 29 U/L (ref 11–38)
Albumin: 3.6 g/dL (ref 3.3–5.5)
Alkaline Phosphatase: 55 U/L (ref 26–84)
BUN, Bld: 14 mg/dL (ref 7–22)
CALCIUM: 9.3 mg/dL (ref 8.0–10.3)
CHLORIDE: 103 meq/L (ref 98–108)
CO2: 24 meq/L (ref 18–33)
CREATININE: 0.7 mg/dL (ref 0.6–1.2)
GLUCOSE: 97 mg/dL (ref 73–118)
Potassium: 4.1 mEq/L (ref 3.3–4.7)
Sodium: 139 mEq/L (ref 128–145)
Total Bilirubin: 0.5 mg/dl (ref 0.20–1.60)
Total Protein: 6.2 g/dL — ABNORMAL LOW (ref 6.4–8.1)

## 2014-02-27 LAB — CBC WITH DIFFERENTIAL (CANCER CENTER ONLY)
BASO#: 0 10*3/uL (ref 0.0–0.2)
BASO%: 0.6 % (ref 0.0–2.0)
EOS ABS: 0.3 10*3/uL (ref 0.0–0.5)
EOS%: 7.6 % — ABNORMAL HIGH (ref 0.0–7.0)
HCT: 37.4 % (ref 34.8–46.6)
HGB: 12.5 g/dL (ref 11.6–15.9)
LYMPH#: 0.6 10*3/uL — ABNORMAL LOW (ref 0.9–3.3)
LYMPH%: 16.4 % (ref 14.0–48.0)
MCH: 30 pg (ref 26.0–34.0)
MCHC: 33.4 g/dL (ref 32.0–36.0)
MCV: 90 fL (ref 81–101)
MONO#: 0.7 10*3/uL (ref 0.1–0.9)
MONO%: 21 % — ABNORMAL HIGH (ref 0.0–13.0)
NEUT#: 1.9 10*3/uL (ref 1.5–6.5)
NEUT%: 54.4 % (ref 39.6–80.0)
Platelets: 114 10*3/uL — ABNORMAL LOW (ref 145–400)
RBC: 4.17 10*6/uL (ref 3.70–5.32)
RDW: 16.1 % — AB (ref 11.1–15.7)
WBC: 3.5 10*3/uL — ABNORMAL LOW (ref 3.9–10.0)

## 2014-02-27 LAB — LACTATE DEHYDROGENASE: LDH: 146 U/L (ref 94–250)

## 2014-02-27 MED ORDER — DEXAMETHASONE SODIUM PHOSPHATE 10 MG/ML IJ SOLN
INTRAMUSCULAR | Status: AC
  Filled 2014-02-27: qty 1

## 2014-02-27 MED ORDER — SODIUM CHLORIDE 0.9 % IV SOLN
72.0000 mg/m2 | Freq: Once | INTRAVENOUS | Status: AC
  Administered 2014-02-27: 135 mg via INTRAVENOUS
  Filled 2014-02-27: qty 1.5

## 2014-02-27 MED ORDER — DIPHENHYDRAMINE HCL 25 MG PO CAPS
50.0000 mg | ORAL_CAPSULE | Freq: Once | ORAL | Status: AC
  Administered 2014-02-27: 50 mg via ORAL

## 2014-02-27 MED ORDER — SODIUM CHLORIDE 0.9 % IJ SOLN
10.0000 mL | INTRAMUSCULAR | Status: DC | PRN
  Administered 2014-02-27: 10 mL
  Filled 2014-02-27: qty 10

## 2014-02-27 MED ORDER — PB-HYOSCY-ATROPINE-SCOPOLAMINE 16.2 MG PO TABS: ORAL_TABLET | ORAL | Status: DC

## 2014-02-27 MED ORDER — HEPARIN SOD (PORK) LOCK FLUSH 100 UNIT/ML IV SOLN
500.0000 [IU] | Freq: Once | INTRAVENOUS | Status: AC | PRN
  Administered 2014-02-27: 500 [IU]
  Filled 2014-02-27: qty 5

## 2014-02-27 MED ORDER — ACETAMINOPHEN 325 MG PO TABS
ORAL_TABLET | ORAL | Status: AC
  Filled 2014-02-27: qty 2

## 2014-02-27 MED ORDER — SODIUM CHLORIDE 0.9 % IV SOLN
Freq: Once | INTRAVENOUS | Status: AC
  Administered 2014-02-27: 10:00:00 via INTRAVENOUS

## 2014-02-27 MED ORDER — DIPHENHYDRAMINE HCL 25 MG PO CAPS
ORAL_CAPSULE | ORAL | Status: AC
  Filled 2014-02-27: qty 2

## 2014-02-27 MED ORDER — ONDANSETRON 8 MG/50ML IVPB (CHCC)
8.0000 mg | Freq: Once | INTRAVENOUS | Status: AC
  Administered 2014-02-27: 8 mg via INTRAVENOUS

## 2014-02-27 MED ORDER — SODIUM CHLORIDE 0.9 % IV SOLN
375.0000 mg/m2 | Freq: Once | INTRAVENOUS | Status: AC
  Administered 2014-02-27: 700 mg via INTRAVENOUS
  Filled 2014-02-27: qty 70

## 2014-02-27 MED ORDER — ACETAMINOPHEN 325 MG PO TABS
650.0000 mg | ORAL_TABLET | Freq: Once | ORAL | Status: AC
  Administered 2014-02-27: 650 mg via ORAL

## 2014-02-27 MED ORDER — DEXAMETHASONE SODIUM PHOSPHATE 10 MG/ML IJ SOLN
10.0000 mg | Freq: Once | INTRAMUSCULAR | Status: AC
  Administered 2014-02-27: 10 mg via INTRAVENOUS

## 2014-02-27 NOTE — Progress Notes (Signed)
Hematology and Oncology Follow Up Visit  Makayla Fisher [id number] [birth year] 80 y.o. 02/27/2014   Principle Diagnosis:   Follicular large cell non-Hodgkin's lymphoma  Current Therapy:    Status post 4 cycles of Rituxan/bendamustine     Interim History:  [name] is back for followup. She is doing quite well. She ishaving trouble with her ostomy site. She sees Dr. Marcello Moores of surgery in a couple weeks.  She does have some abdominal discomfort. She's had no bleeding. She has had loose stools. I did give her Donnatal but somehow this was stopped at her nursing home. We have to get that back to her which I think will help.  She's had no cough. She's had no rashes. There's been no leg swelling.  She really needs some physical therapy. I told her that the doctor who rounds on her at the nursing home should be able to order this for her.  Overall, her performance status is ECOG 1.  Medications: Current outpatient prescriptions: acetaminophen (TYLENOL) 500 MG tablet, Take 500 mg by mouth every 6 (six) hours as needed for mild pain., Disp: , Rfl: ;  amLODipine (NORVASC) 5 MG tablet, Take 5 mg by mouth every morning. , Disp: , Rfl: ;  carvedilol (COREG) 3.125 MG tablet, Take 3.125 mg by mouth daily. , Disp: , Rfl: ;  Cholecalciferol (VITAMIN D3) 2000 UNITS TABS, Take by mouth every morning., Disp: , Rfl:  cycloSPORINE (RESTASIS) 0.05 % ophthalmic emulsion, Place 1 drop into both eyes 2 (two) times daily. , Disp: , Rfl: ;  docusate sodium (COLACE) 100 MG capsule, Take 100 mg by mouth daily. , Disp: , Rfl: ;  ezetimibe (ZETIA) 10 MG tablet, Take 10 mg by mouth every morning. , Disp: , Rfl: ;  furosemide (LASIX) 20 MG tablet, Take 20 mg by mouth every morning. , Disp: , Rfl:  levothyroxine (SYNTHROID, LEVOTHROID) 75 MCG tablet, Take 75 mcg by mouth daily before breakfast., Disp: , Rfl: ;  lisinopril (PRINIVIL,ZESTRIL) 20 MG tablet, Take 20 mg by mouth 2 (two) times daily., Disp: , Rfl: ;   Multiple Vitamin (MULTIVITAMIN WITH MINERALS) TABS tablet, Take 1 tablet by mouth daily., Disp: , Rfl:  ondansetron (ZOFRAN) 8 MG tablet, Take 1 tablet (8 mg total) by mouth 2 (two) times daily. For 4 days following chemo, Disp: 20 tablet, Rfl: 2;  potassium chloride SA (K-DUR,KLOR-CON) 20 MEQ tablet, Take 1 tablet (20 mEq total) by mouth daily., Disp: 30 tablet, Rfl: 2;  prochlorperazine (COMPAZINE) 10 MG tablet, Take 0.5 tablets (5 mg total) by mouth every 6 (six) hours as needed for nausea or vomiting., Disp: 30 tablet, Rfl: 3 rOPINIRole (REQUIP) 1 MG tablet, Take 1 mg by mouth at bedtime., Disp: , Rfl: ;  sertraline (ZOLOFT) 25 MG tablet, Take 25 mg by mouth every morning. , Disp: , Rfl: ;  simvastatin (ZOCOR) 20 MG tablet, Take 20 mg by mouth every evening. , Disp: , Rfl: ;  Tiotropium Bromide Monohydrate (SPIRIVA RESPIMAT) 2.5 MCG/ACT AERS, Inhale 1 puff into the lungs daily., Disp: , Rfl:  traMADol (ULTRAM) 50 MG tablet, Take 50 mg by mouth every 6 (six) hours as needed for moderate pain., Disp: , Rfl:  No current facility-administered medications for this visit. Facility-Administered Medications Ordered in Other Visits: bendamustine (TREANDA) 135 mg in sodium chloride 0.9 % 500 mL chemo infusion, 72 mg/m2 (Treatment Plan Actual), Intravenous, Once, [name], MD;  heparin lock flush 100 unit/mL, 500 Units, Intracatheter, Once PRN, [name]  [name], MD;  riTUXimab (RITUXAN) 700 mg in sodium chloride 0.9 % 180 mL chemo infusion, 375 mg/m2 (Treatment Plan Actual), Intravenous, Once, [name], MD sodium chloride 0.9 % injection 10 mL, 10 mL, Intracatheter, PRN, [name], MD  Allergies:  Allergies  Allergen Reactions  . Epipen [Epinephrine Hcl]     Heart palpitations  . Heparin     Unsure of side affects (maybe affected platelets per pt)  . Levaquin [Levofloxacin In D5w] Other (See Comments)    Weakness  . Sulfa Antibiotics Rash    Past Medical History, Surgical history,  Social history, and Family History were reviewed and updated.  Review of Systems: As above  Physical Exam:  height is 5\' 2"  (1.575 m) and weight is 161 lb (73.029 kg). Her oral temperature is 97.6 F (36.4 C). Her blood pressure is 141/55 and her pulse is 69. Her respiration is 16.   Elderly white female. Head and neck exam shows no ocular or oral lesions. There are no palpable cervical or supraclavicular lymph nodes. Lungs are clear bilaterally. Cardiac exam regular in rhythm with no murmurs rubs or bruits. Abdomen is soft. She has good bowel sounds. The ostomy is intact. There is no stool in the ostomy bag. There is no palpable liver or spleen tip. Her laparotomy scars are well-healed. Back exam no tenderness over the spine ribs or hips. Extremities shows no clubbing cyanosis or edema. Skin exam no rashes.   Lab Results  Component Value Date   WBC 3.5* 02/27/2014   HGB 12.5 02/27/2014   HCT 37.4 02/27/2014   MCV 90 02/27/2014   PLT 114* 02/27/2014     Chemistry      Component Value Date/Time   NA 139 02/27/2014 0825   NA 134* 09/25/2013 1141   K 4.1 02/27/2014 0825   K 4.1 09/25/2013 1141   CL 103 02/27/2014 0825   CL 98 09/25/2013 1141   CO2 24 02/27/2014 0825   CO2 25 09/25/2013 1141   BUN 14 02/27/2014 0825   BUN 13 09/25/2013 1141   CREATININE 0.7 02/27/2014 0825   CREATININE 0.61 09/25/2013 1141      Component Value Date/Time   CALCIUM 9.3 02/27/2014 0825   CALCIUM 9.4 09/25/2013 1141   ALKPHOS 55 02/27/2014 0825   ALKPHOS 90 09/25/2013 1141   AST 29 02/27/2014 0825   AST 15 09/25/2013 1141   ALT 14 02/27/2014 0825   ALT <8 09/25/2013 1141   BILITOT 0.50 02/27/2014 0825   BILITOT 0.4 09/25/2013 1141         Impression and Plan: Makayla Fisher is 80 year old female. She has a follicular large cell lymphoma. This is of the abdomen. She's had 4 cycles of chemotherapy. She's done incredibly well. Her PET scan looks wonderful.  I think, at this point in time,  we can probably just do 6 cycles of treatment. we will  go ahead with her  fifth  cycle of chemotherapy today.  I'll plan to get her back in 4-5 weeks.   After her sixth cycle treatment, we will set her up with a follow-up PET scan.  I told her that it probably would be about 6 weeks after her last treatment that she could have surgery to reverse the ostomy.   [name], MD 11/5/201510:23 AM

## 2014-02-27 NOTE — Patient Instructions (Signed)
Bendamustine Injection What is this medicine? BENDAMUSTINE (BEN da MUS teen) is a chemotherapy drug. It is used to treat chronic lymphocytic leukemia and non-Hodgkin lymphoma. This medicine may be used for other purposes; ask your health care provider or pharmacist if you have questions. COMMON BRAND NAME(S): Treanda What should I tell my health care provider before I take this medicine? They need to know if you have any of these conditions: -kidney disease -liver disease -an unusual or allergic reaction to bendamustine, mannitol, other medicines, foods, dyes, or preservatives -pregnant or trying to get pregnant -breast-feeding How should I use this medicine? This medicine is for infusion into a vein. It is given by a health care professional in a hospital or clinic setting. Talk to your pediatrician regarding the use of this medicine in children. Special care may be needed. Overdosage: If you think you have taken too much of this medicine contact a poison control center or emergency room at once. NOTE: This medicine is only for you. Do not share this medicine with others. What if I miss a dose? It is important not to miss your dose. Call your doctor or health care professional if you are unable to keep an appointment. What may interact with this medicine? Do not take this medicine with any of the following medications: -clozapine This medicine may also interact with the following medications: -atazanavir -cimetidine -ciprofloxacin -enoxacin -fluvoxamine -medicines for seizures like carbamazepine and phenobarbital -mexiletine -rifampin -tacrine -thiabendazole -zileuton This list may not describe all possible interactions. Give your health care provider a list of all the medicines, herbs, non-prescription drugs, or dietary supplements you use. Also tell them if you smoke, drink alcohol, or use illegal drugs. Some items may interact with your medicine. What should I watch for while  using this medicine? Your condition will be monitored carefully while you are receiving this medicine. This drug may make you feel generally unwell. This is not uncommon, as chemotherapy can affect healthy cells as well as cancer cells. Report any side effects. Continue your course of treatment even though you feel ill unless your doctor tells you to stop. Call your doctor or health care professional for advice if you get a fever, chills or sore throat, or other symptoms of a cold or flu. Do not treat yourself. This drug decreases your body's ability to fight infections. Try to avoid being around people who are sick. This medicine may increase your risk to bruise or bleed. Call your doctor or health care professional if you notice any unusual bleeding. Be careful brushing and flossing your teeth or using a toothpick because you may get an infection or bleed more easily. If you have any dental work done, tell your dentist you are receiving this medicine. Avoid taking products that contain aspirin, acetaminophen, ibuprofen, naproxen, or ketoprofen unless instructed by your doctor. These medicines may hide a fever. Do not become pregnant while taking this medicine. Women should inform their doctor if they wish to become pregnant or think they might be pregnant. There is a potential for serious side effects to an unborn child. Men should inform their doctors if they wish to father a child. This medicine may lower sperm counts. Talk to your health care professional or pharmacist for more information. Do not breast-feed an infant while taking this medicine. What side effects may I notice from receiving this medicine? Side effects that you should report to your doctor or health care professional as soon as possible: -allergic reactions like skin rash,   itching or hives, swelling of the face, lips, or tongue -low blood counts - this medicine may decrease the number of white blood cells, red blood cells and  platelets. You may be at increased risk for infections and bleeding. -signs of infection - fever or chills, cough, sore throat, pain or difficulty passing urine -signs of decreased platelets or bleeding - bruising, pinpoint red spots on the skin, black, tarry stools, blood in the urine -signs of decreased red blood cells - unusually weak or tired, fainting spells, lightheadedness -trouble passing urine or change in the amount of urine Side effects that usually do not require medical attention (report to your doctor or health care professional if they continue or are bothersome): -diarrhea This list may not describe all possible side effects. Call your doctor for medical advice about side effects. You may report side effects to FDA at 1-800-FDA-1088. Where should I keep my medicine? This drug is given in a hospital or clinic and will not be stored at home. NOTE: This sheet is a summary. It may not cover all possible information. If you have questions about this medicine, talk to your doctor, pharmacist, or health care provider.  2015, [employer] Standard. (2011-07-08 14:15:47) Rituximab injection What is this medicine? RITUXIMAB (ri TUX i mab) is a monoclonal antibody. This medicine changes the way the body's immune system works. It is used commonly to treat non-Hodgkin's lymphoma and other conditions. In cancer cells, this drug targets a specific protein within cancer cells and stops the cancer cells from growing. It is also used to treat rhuematoid arthritis (RA). In RA, this medicine slow the inflammatory process and help reduce joint pain and swelling. This medicine is often used with other cancer or arthritis medications. This medicine may be used for other purposes; ask your health care provider or pharmacist if you have questions. COMMON BRAND NAME(S): Rituxan What should I tell my health care provider before I take this medicine? They need to know if you have any of these  conditions: -blood disorders -heart disease -history of hepatitis B -infection (especially a virus infection such as chickenpox, cold sores, or herpes) -irregular heartbeat -kidney disease -lung or breathing disease, like asthma -lupus -an unusual or allergic reaction to rituximab, mouse proteins, other medicines, foods, dyes, or preservatives -pregnant or trying to get pregnant -breast-feeding How should I use this medicine? This medicine is for infusion into a vein. It is administered in a hospital or clinic by a specially trained health care professional. A special MedGuide will be given to you by the pharmacist with each prescription and refill. Be sure to read this information carefully each time. Talk to your pediatrician regarding the use of this medicine in children. This medicine is not approved for use in children. Overdosage: If you think you have taken too much of this medicine contact a poison control center or emergency room at once. NOTE: This medicine is only for you. Do not share this medicine with others. What if I miss a dose? It is important not to miss a dose. Call your doctor or health care professional if you are unable to keep an appointment. What may interact with this medicine? -cisplatin -medicines for blood pressure -some other medicines for arthritis -vaccines This list may not describe all possible interactions. Give your health care provider a list of all the medicines, herbs, non-prescription drugs, or dietary supplements you use. Also tell them if you smoke, drink alcohol, or use illegal drugs. Some items may interact with   your medicine. What should I watch for while using this medicine? Report any side effects that you notice during your treatment right away, such as changes in your breathing, fever, chills, dizziness or lightheadedness. These effects are more common with the first dose. Visit your prescriber or health care professional for checks on your  progress. You will need to have regular blood work. Report any other side effects. The side effects of this medicine can continue after you finish your treatment. Continue your course of treatment even though you feel ill unless your doctor tells you to stop. Call your doctor or health care professional for advice if you get a fever, chills or sore throat, or other symptoms of a cold or flu. Do not treat yourself. This drug decreases your body's ability to fight infections. Try to avoid being around people who are sick. This medicine may increase your risk to bruise or bleed. Call your doctor or health care professional if you notice any unusual bleeding. Be careful brushing and flossing your teeth or using a toothpick because you may get an infection or bleed more easily. If you have any dental work done, tell your dentist you are receiving this medicine. Avoid taking products that contain aspirin, acetaminophen, ibuprofen, naproxen, or ketoprofen unless instructed by your doctor. These medicines may hide a fever. Do not become pregnant while taking this medicine. Women should inform their doctor if they wish to become pregnant or think they might be pregnant. There is a potential for serious side effects to an unborn child. Talk to your health care professional or pharmacist for more information. Do not breast-feed an infant while taking this medicine. What side effects may I notice from receiving this medicine? Side effects that you should report to your doctor or health care professional as soon as possible: -allergic reactions like skin rash, itching or hives, swelling of the face, lips, or tongue -low blood counts - this medicine may decrease the number of white blood cells, red blood cells and platelets. You may be at increased risk for infections and bleeding. -signs of infection - fever or chills, cough, sore throat, pain or difficulty passing urine -signs of decreased platelets or bleeding -  bruising, pinpoint red spots on the skin, black, tarry stools, blood in the urine -signs of decreased red blood cells - unusually weak or tired, fainting spells, lightheadedness -breathing problems -confused, not responsive -chest pain -fast, irregular heartbeat -feeling faint or lightheaded, falls -mouth sores -redness, blistering, peeling or loosening of the skin, including inside the mouth -stomach pain -swelling of the ankles, feet, or hands -trouble passing urine or change in the amount of urine Side effects that usually do not require medical attention (report to your doctor or other health care professional if they continue or are bothersome): -anxiety -headache -loss of appetite -muscle aches -nausea -night sweats This list may not describe all possible side effects. Call your doctor for medical advice about side effects. You may report side effects to FDA at 1-800-FDA-1088. Where should I keep my medicine? This drug is given in a hospital or clinic and will not be stored at home. NOTE: This sheet is a summary. It may not cover all possible information. If you have questions about this medicine, talk to your doctor, pharmacist, or health care provider.  2015, [employer]/Gold Standard. (2007-12-10 14:04:59)  

## 2014-02-28 ENCOUNTER — Other Ambulatory Visit: Payer: Self-pay | Admitting: *Deleted

## 2014-02-28 ENCOUNTER — Ambulatory Visit: Payer: Medicare Other | Admitting: Hematology & Oncology

## 2014-02-28 ENCOUNTER — Other Ambulatory Visit: Payer: Medicare Other | Admitting: Lab

## 2014-02-28 ENCOUNTER — Ambulatory Visit (HOSPITAL_BASED_OUTPATIENT_CLINIC_OR_DEPARTMENT_OTHER): Payer: Medicare Other

## 2014-02-28 ENCOUNTER — Ambulatory Visit: Payer: Medicare Other

## 2014-02-28 DIAGNOSIS — C8590 Non-Hodgkin lymphoma, unspecified, unspecified site: Secondary | ICD-10-CM

## 2014-02-28 DIAGNOSIS — Z23 Encounter for immunization: Secondary | ICD-10-CM

## 2014-02-28 DIAGNOSIS — C8290 Follicular lymphoma, unspecified, unspecified site: Secondary | ICD-10-CM

## 2014-02-28 DIAGNOSIS — Z5111 Encounter for antineoplastic chemotherapy: Secondary | ICD-10-CM

## 2014-02-28 MED ORDER — DEXAMETHASONE SODIUM PHOSPHATE 10 MG/ML IJ SOLN
10.0000 mg | Freq: Once | INTRAMUSCULAR | Status: AC
  Administered 2014-02-28: 10 mg via INTRAVENOUS

## 2014-02-28 MED ORDER — INFLUENZA VAC SPLIT QUAD 0.5 ML IM SUSY
0.5000 mL | PREFILLED_SYRINGE | INTRAMUSCULAR | Status: AC
  Administered 2014-02-28: 0.5 mL via INTRAMUSCULAR
  Filled 2014-02-28: qty 0.5

## 2014-02-28 MED ORDER — SODIUM CHLORIDE 0.9 % IJ SOLN
10.0000 mL | INTRAMUSCULAR | Status: DC | PRN
  Administered 2014-02-28: 10 mL
  Filled 2014-02-28: qty 10

## 2014-02-28 MED ORDER — ONDANSETRON 8 MG/50ML IVPB (CHCC)
8.0000 mg | Freq: Once | INTRAVENOUS | Status: AC
  Administered 2014-02-28: 8 mg via INTRAVENOUS

## 2014-02-28 MED ORDER — FAMOTIDINE 20 MG PO TABS
20.0000 mg | ORAL_TABLET | Freq: Two times a day (BID) | ORAL | Status: DC
Start: 2014-02-28 — End: 2016-05-05

## 2014-02-28 MED ORDER — HEPARIN SOD (PORK) LOCK FLUSH 100 UNIT/ML IV SOLN
500.0000 [IU] | Freq: Once | INTRAVENOUS | Status: AC | PRN
  Administered 2014-02-28: 500 [IU]
  Filled 2014-02-28: qty 5

## 2014-02-28 MED ORDER — SODIUM CHLORIDE 0.9 % IV SOLN
Freq: Once | INTRAVENOUS | Status: AC
  Administered 2014-02-28: 11:00:00 via INTRAVENOUS

## 2014-02-28 MED ORDER — SODIUM CHLORIDE 0.9 % IV SOLN
72.0000 mg/m2 | Freq: Once | INTRAVENOUS | Status: AC
  Administered 2014-02-28: 135 mg via INTRAVENOUS
  Filled 2014-02-28: qty 1.5

## 2014-02-28 MED ORDER — DEXAMETHASONE SODIUM PHOSPHATE 10 MG/ML IJ SOLN
INTRAMUSCULAR | Status: AC
  Filled 2014-02-28: qty 1

## 2014-02-28 NOTE — Patient Instructions (Signed)
Bendamustine Injection What is this medicine? BENDAMUSTINE (BEN da MUS teen) is a chemotherapy drug. It is used to treat chronic lymphocytic leukemia and non-Hodgkin lymphoma. This medicine may be used for other purposes; ask your health care provider or pharmacist if you have questions. COMMON BRAND NAME(S): Treanda What should I tell my health care provider before I take this medicine? They need to know if you have any of these conditions: -kidney disease -liver disease -an unusual or allergic reaction to bendamustine, mannitol, other medicines, foods, dyes, or preservatives -pregnant or trying to get pregnant -breast-feeding How should I use this medicine? This medicine is for infusion into a vein. It is given by a health care professional in a hospital or clinic setting. Talk to your pediatrician regarding the use of this medicine in children. Special care may be needed. Overdosage: If you think you have taken too much of this medicine contact a poison control center or emergency room at once. NOTE: This medicine is only for you. Do not share this medicine with others. What if I miss a dose? It is important not to miss your dose. Call your doctor or health care professional if you are unable to keep an appointment. What may interact with this medicine? Do not take this medicine with any of the following medications: -clozapine This medicine may also interact with the following medications: -atazanavir -cimetidine -ciprofloxacin -enoxacin -fluvoxamine -medicines for seizures like carbamazepine and phenobarbital -mexiletine -rifampin -tacrine -thiabendazole -zileuton This list may not describe all possible interactions. Give your health care provider a list of all the medicines, herbs, non-prescription drugs, or dietary supplements you use. Also tell them if you smoke, drink alcohol, or use illegal drugs. Some items may interact with your medicine. What should I watch for while  using this medicine? Your condition will be monitored carefully while you are receiving this medicine. This drug may make you feel generally unwell. This is not uncommon, as chemotherapy can affect healthy cells as well as cancer cells. Report any side effects. Continue your course of treatment even though you feel ill unless your doctor tells you to stop. Call your doctor or health care professional for advice if you get a fever, chills or sore throat, or other symptoms of a cold or flu. Do not treat yourself. This drug decreases your body's ability to fight infections. Try to avoid being around people who are sick. This medicine may increase your risk to bruise or bleed. Call your doctor or health care professional if you notice any unusual bleeding. Be careful brushing and flossing your teeth or using a toothpick because you may get an infection or bleed more easily. If you have any dental work done, tell your dentist you are receiving this medicine. Avoid taking products that contain aspirin, acetaminophen, ibuprofen, naproxen, or ketoprofen unless instructed by your doctor. These medicines may hide a fever. Do not become pregnant while taking this medicine. Women should inform their doctor if they wish to become pregnant or think they might be pregnant. There is a potential for serious side effects to an unborn child. Men should inform their doctors if they wish to father a child. This medicine may lower sperm counts. Talk to your health care professional or pharmacist for more information. Do not breast-feed an infant while taking this medicine. What side effects may I notice from receiving this medicine? Side effects that you should report to your doctor or health care professional as soon as possible: -allergic reactions like skin rash,   itching or hives, swelling of the face, lips, or tongue -low blood counts - this medicine may decrease the number of white blood cells, red blood cells and  platelets. You may be at increased risk for infections and bleeding. -signs of infection - fever or chills, cough, sore throat, pain or difficulty passing urine -signs of decreased platelets or bleeding - bruising, pinpoint red spots on the skin, black, tarry stools, blood in the urine -signs of decreased red blood cells - unusually weak or tired, fainting spells, lightheadedness -trouble passing urine or change in the amount of urine Side effects that usually do not require medical attention (report to your doctor or health care professional if they continue or are bothersome): -diarrhea This list may not describe all possible side effects. Call your doctor for medical advice about side effects. You may report side effects to FDA at 1-800-FDA-1088. Where should I keep my medicine? This drug is given in a hospital or clinic and will not be stored at home. NOTE: This sheet is a summary. It may not cover all possible information. If you have questions about this medicine, talk to your doctor, pharmacist, or health care provider.  2015, [employer] Standard. (2011-07-08 14:15:47)  

## 2014-03-12 ENCOUNTER — Other Ambulatory Visit (INDEPENDENT_AMBULATORY_CARE_PROVIDER_SITE_OTHER): Payer: Self-pay | Admitting: General Surgery

## 2014-03-12 NOTE — H&P (Signed)
Female History of Present Illness [name] MD; 16/01/9603 5:48 PM) The patient is a 80 year old female who presents with a complaint of colostomy stricture. This is a 80 year old female who presents with continued colostomy dysfunction. 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 She was in the hospital for approximately 10 days and required significant rehabilitation afterwards. She did have a postoperative small bowel obstruction or possibly a contained leak for which she was given antibiotics. She slowly recovered. She has since moved to [place] to an assisted living facility in town. She continues to have difficulty pouching her ostomy as well as pain around the ostomy site. She was found to have lymphoma on my previous workup and has underwent 6 rounds of chemotherapy with Dr. Marin Olp. She is completing her last round of chemotherapy in 2 weeks and will be ready for surgery approximately 6 weeks after that. Other Problems Marjean Donna, CMA; 03/12/2014 4:52 PM) Arthritis Bladder Problems Cancer Diverticulosis High blood pressure Myocardial infarction Thyroid Disease Transfusion history  Past Surgical History Marjean Donna, [place]; 03/12/2014 4:52 PM) Cataract Surgery Bilateral. Cesarean Section - Multiple Colon Polyp Removal - Colonoscopy Colon Removal - Partial Gallbladder Surgery - Open Hysterectomy (not due to cancer) - Partial  Diagnostic Studies History Marjean Donna, [place]; 03/12/2014 4:52 PM) Colonoscopy 1-5 years ago Mammogram within last year Pap Smear >5 years ago  Allergies Marjean Donna, [employer]; 03/12/2014 4:54 PM) Heparin (Porcine) in D5W *ANTICOAGULANTS* Levaquin *FLUOROQUINOLONES* Sulfa Antibiotics  Medication History ([name], CMA; 03/12/2014 5:03 PM) AmLODIPine Besylate (5MG  Tablet, Oral)  Active. Carvedilol (3.125MG  Tablet, Oral) Active. Furosemide (20MG  Tablet, Oral) Active. Zetia (10MG  Tablet, Oral) Active. Restasis (0.05% Emulsion, Ophthalmic) Active. Vitamin D3 (2000UNIT Capsule, Oral) Active. Famotidine (20MG  Tablet, Oral) Active. Levothyroxine Sodium (75MCG Tablet, Oral) Active. Lisinopril (20MG  Tablet, Oral) Active. Multivitamins (Oral) Active. Potassium Chloride (20MEQ Packet, Oral) Active. Requip (1MG  Tablet, Oral) Active. Sertraline HCl (25MG  Tablet, Oral) Active. Simvastatin (20MG  Tablet, Oral) Active. Spiriva HandiHaler (18MCG Capsule, Inhalation) Active. TraMADol HCl (50MG  Tablet, Oral) Active.  Social History Marjean Donna, [place]; 03/12/2014 4:52 PM) Alcohol use Remotely quit alcohol use. No drug use Tobacco use Former smoker.  Family History Marjean Donna, [place]; 03/12/2014 4:52 PM) Heart Disease Mother. Hypertension Mother. Ischemic Bowel Disease Mother. Respiratory Condition Daughter.  Pregnancy / Birth History Marjean Donna, [place]; 03/12/2014 4:52 PM) Age at menarche 32 years. Age of menopause 26-55 Gravida 3 Maternal age 27-25 Para 3     Review of Systems ([place]; 03/12/2014 4:52 PM) General Present- Fatigue and Weight Loss. Not Present- Appetite Loss, Chills, Fever, Night Sweats and Weight Gain. Skin Not Present- Change in Wart/Mole, Dryness, Hives, Jaundice, New Lesions, Non-Healing Wounds, Rash and Ulcer. HEENT Present- Wears glasses/contact lenses. Not Present- Earache, Hearing Loss, Hoarseness, Nose Bleed, Oral Ulcers, Ringing in the Ears, Seasonal Allergies, Sinus Pain, Sore Throat, Visual Disturbances and Yellow Eyes. Respiratory Not Present- Bloody sputum, Chronic Cough, Difficulty Breathing, Snoring and Wheezing. Cardiovascular Not Present- Chest Pain, Difficulty Breathing Lying Down, Leg Cramps, Palpitations, Rapid Heart Rate, Shortness of Breath and Swelling of Extremities. Gastrointestinal Not  Present- Abdominal Pain, Bloating, Bloody Stool, Change in Bowel Habits, Chronic diarrhea, Constipation, Difficulty Swallowing, Excessive gas, Gets full quickly at meals, Hemorrhoids, Indigestion, Nausea, Rectal Pain and Vomiting. Female Genitourinary Present- Frequency, Nocturia and Urgency. Not Present- Painful Urination and Pelvic Pain. Musculoskeletal Not Present- Back Pain, Joint Pain, Joint Stiffness,  Muscle Pain, Muscle Weakness and Swelling of Extremities. Neurological Not Present- Decreased Memory, Fainting, Headaches, Numbness, Seizures, Tingling, Tremor, Trouble walking and Weakness. Psychiatric Not Present- Anxiety, Bipolar, Change in Sleep Pattern, Depression, Fearful and Frequent crying. Endocrine Not Present- Cold Intolerance, Excessive Hunger, Hair Changes, Heat Intolerance, Hot flashes and New Diabetes. Hematology Not Present- Easy Bruising, Excessive bleeding, Gland problems, HIV and Persistent Infections.  Vitals ([name] CMA; 03/12/2014 5:04 PM) 03/12/2014 5:03 PM Weight: 161 lb Height: 61in Body Surface Area: 1.77 m Body Mass Index: 30.42 kg/m Temp.: 36F(Temporal)  Pulse: 77 (Regular)  BP: 130/76 (Sitting, Left Arm, Standard)     Physical Exam [name] MD; 94/10/6806 5:51 PM)  General Mental Status-Alert. General Appearance-Consistent with stated age. Hydration-Well hydrated. Voice-Normal.  Head and Neck Head-normocephalic, atraumatic with no lesions or palpable masses. Trachea-midline. Thyroid Gland Characteristics - normal size and consistency.  Eye Eyeball - Bilateral-Extraocular movements intact. Sclera/Conjunctiva - Bilateral-No scleral icterus.  Chest and Lung Exam Chest and lung exam reveals -quiet, even and easy respiratory effort with no use of accessory muscles and on auscultation, normal breath sounds, no adventitious sounds and normal vocal resonance. Inspection Chest Wall - Normal. Back -  normal.  Cardiovascular Cardiovascular examination reveals -normal heart sounds, regular rate and rhythm with no murmurs and normal pedal pulses bilaterally.  Abdomen Inspection Inspection of the abdomen reveals - No Hernias. Note: colostomy in left lower quadrant. Stricture noted. Skin - Scar - Periumbilical. Palpation/Percussion Palpation and Percussion of the abdomen reveal - Soft, Non Tender, No Rebound tenderness, No Rigidity (guarding) and No hepatosplenomegaly. Auscultation Auscultation of the abdomen reveals - Bowel sounds normal.  Neurologic Neurologic evaluation reveals -alert and oriented x 3 with no impairment of recent or remote memory. Mental Status-Normal.  Musculoskeletal Normal Exam - Left-Upper Extremity Strength Normal and Lower Extremity Strength Normal. Normal Exam - Right-Upper Extremity Strength Normal and Lower Extremity Strength Normal.    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 She was found to have lymphoma during workup for surgery. She is now completed chemotherapy for this with good response. She is ready to schedule her colostomy surgery now. Impression: we discussed colostomy revision which include the primary plan of revising the ostomy in its current location. If that is not an option, we will relocate to the left upper quadrant. we discussed colostomy reversal. She does not want to undergo this surgery right now. I will have her see a local cardiologist for risk stratification prior to surgery. The surgery and anatomy were described to the patient as well as the risks of surgery and the possible complications. These include: Bleeding, deep abdominal infections and possible wound complications such as hernia and infection, damage to adjacent structures, leak of surgical connections, which can lead to other  surgeries and possibly an ostomy, possible need for other procedures, such as abscess drains in radiology, possible prolonged hospital stay, possible diarrhea from removal of part of the colon, possible constipation from narcotics, possible bowel, bladder or sexual dysfunction if having rectal surgery, prolonged fatigue/weakness or appetite loss, possible early recurrence of of disease, possible complications of their medical problems such as heart disease or arrhythmias or lung problems, death (less than 1%). I believe the patient understands and wishes to proceed with the surgery.

## 2014-03-27 ENCOUNTER — Ambulatory Visit (HOSPITAL_BASED_OUTPATIENT_CLINIC_OR_DEPARTMENT_OTHER): Payer: Medicare Other | Admitting: Hematology & Oncology

## 2014-03-27 ENCOUNTER — Ambulatory Visit (HOSPITAL_BASED_OUTPATIENT_CLINIC_OR_DEPARTMENT_OTHER): Payer: Medicare Other

## 2014-03-27 ENCOUNTER — Encounter: Payer: Self-pay | Admitting: Hematology & Oncology

## 2014-03-27 ENCOUNTER — Other Ambulatory Visit (HOSPITAL_BASED_OUTPATIENT_CLINIC_OR_DEPARTMENT_OTHER): Payer: Medicare Other | Admitting: Lab

## 2014-03-27 VITALS — BP 123/48 | HR 75 | Temp 97.8°F | Resp 14 | Ht 62.0 in | Wt 158.0 lb

## 2014-03-27 DIAGNOSIS — C8280 Other types of follicular lymphoma, unspecified site: Secondary | ICD-10-CM

## 2014-03-27 DIAGNOSIS — C8590 Non-Hodgkin lymphoma, unspecified, unspecified site: Secondary | ICD-10-CM

## 2014-03-27 DIAGNOSIS — Z5111 Encounter for antineoplastic chemotherapy: Secondary | ICD-10-CM

## 2014-03-27 DIAGNOSIS — Z5112 Encounter for antineoplastic immunotherapy: Secondary | ICD-10-CM

## 2014-03-27 LAB — CBC WITH DIFFERENTIAL (CANCER CENTER ONLY)
BASO#: 0 10*3/uL (ref 0.0–0.2)
BASO%: 1 % (ref 0.0–2.0)
EOS ABS: 0.2 10*3/uL (ref 0.0–0.5)
EOS%: 7 % (ref 0.0–7.0)
HCT: 36.8 % (ref 34.8–46.6)
HEMOGLOBIN: 12.3 g/dL (ref 11.6–15.9)
LYMPH#: 0.5 10*3/uL — AB (ref 0.9–3.3)
LYMPH%: 17.3 % (ref 14.0–48.0)
MCH: 30.5 pg (ref 26.0–34.0)
MCHC: 33.4 g/dL (ref 32.0–36.0)
MCV: 91 fL (ref 81–101)
MONO#: 1 10*3/uL — AB (ref 0.1–0.9)
MONO%: 31.9 % — ABNORMAL HIGH (ref 0.0–13.0)
NEUT%: 42.8 % (ref 39.6–80.0)
NEUTROS ABS: 1.3 10*3/uL — AB (ref 1.5–6.5)
PLATELETS: 88 10*3/uL — AB (ref 145–400)
RBC: 4.03 10*6/uL (ref 3.70–5.32)
RDW: 17.8 % — ABNORMAL HIGH (ref 11.1–15.7)
WBC: 3.1 10*3/uL — AB (ref 3.9–10.0)

## 2014-03-27 LAB — CMP (CANCER CENTER ONLY)
ALT(SGPT): 15 U/L (ref 10–47)
AST: 29 U/L (ref 11–38)
Albumin: 3.6 g/dL (ref 3.3–5.5)
Alkaline Phosphatase: 60 U/L (ref 26–84)
BUN: 16 mg/dL (ref 7–22)
CALCIUM: 8.6 mg/dL (ref 8.0–10.3)
CHLORIDE: 102 meq/L (ref 98–108)
CO2: 26 meq/L (ref 18–33)
Creat: 0.8 mg/dl (ref 0.6–1.2)
GLUCOSE: 97 mg/dL (ref 73–118)
Potassium: 3.7 mEq/L (ref 3.3–4.7)
Sodium: 139 mEq/L (ref 128–145)
Total Bilirubin: 0.5 mg/dl (ref 0.20–1.60)
Total Protein: 6.4 g/dL (ref 6.4–8.1)

## 2014-03-27 LAB — TECHNOLOGIST REVIEW CHCC SATELLITE

## 2014-03-27 LAB — LACTATE DEHYDROGENASE: LDH: 182 U/L (ref 94–250)

## 2014-03-27 MED ORDER — HEPARIN SOD (PORK) LOCK FLUSH 100 UNIT/ML IV SOLN
500.0000 [IU] | Freq: Once | INTRAVENOUS | Status: AC | PRN
  Administered 2014-03-27: 500 [IU]
  Filled 2014-03-27: qty 5

## 2014-03-27 MED ORDER — DEXAMETHASONE SODIUM PHOSPHATE 10 MG/ML IJ SOLN
INTRAMUSCULAR | Status: AC
  Filled 2014-03-27: qty 1

## 2014-03-27 MED ORDER — SODIUM CHLORIDE 0.9 % IV SOLN
72.0000 mg/m2 | Freq: Once | INTRAVENOUS | Status: AC
  Administered 2014-03-27: 135 mg via INTRAVENOUS
  Filled 2014-03-27: qty 1.5

## 2014-03-27 MED ORDER — DEXAMETHASONE SODIUM PHOSPHATE 10 MG/ML IJ SOLN
10.0000 mg | Freq: Once | INTRAMUSCULAR | Status: AC
  Administered 2014-03-27: 10 mg via INTRAVENOUS

## 2014-03-27 MED ORDER — ONDANSETRON 8 MG/50ML IVPB (CHCC)
8.0000 mg | Freq: Once | INTRAVENOUS | Status: AC
  Administered 2014-03-27: 8 mg via INTRAVENOUS

## 2014-03-27 MED ORDER — FLUCONAZOLE 100 MG PO TABS: 100.0000 mg | ORAL_TABLET | Freq: Every day | ORAL | Status: DC

## 2014-03-27 MED ORDER — SODIUM CHLORIDE 0.9 % IV SOLN
375.0000 mg/m2 | Freq: Once | INTRAVENOUS | Status: AC
  Administered 2014-03-27: 700 mg via INTRAVENOUS
  Filled 2014-03-27: qty 70

## 2014-03-27 MED ORDER — SODIUM CHLORIDE 0.9 % IV SOLN
Freq: Once | INTRAVENOUS | Status: AC
  Administered 2014-03-27: 10:00:00 via INTRAVENOUS

## 2014-03-27 MED ORDER — DIPHENHYDRAMINE HCL 25 MG PO CAPS
ORAL_CAPSULE | ORAL | Status: AC
  Filled 2014-03-27: qty 2

## 2014-03-27 MED ORDER — DIPHENHYDRAMINE HCL 25 MG PO CAPS
50.0000 mg | ORAL_CAPSULE | Freq: Once | ORAL | Status: AC
  Administered 2014-03-27: 50 mg via ORAL

## 2014-03-27 MED ORDER — ACETAMINOPHEN 325 MG PO TABS
650.0000 mg | ORAL_TABLET | Freq: Once | ORAL | Status: AC
  Administered 2014-03-27: 650 mg via ORAL

## 2014-03-27 MED ORDER — ACETAMINOPHEN 325 MG PO TABS
ORAL_TABLET | ORAL | Status: AC
  Filled 2014-03-27: qty 2

## 2014-03-27 MED ORDER — SODIUM CHLORIDE 0.9 % IJ SOLN
10.0000 mL | INTRAMUSCULAR | Status: DC | PRN
  Administered 2014-03-27: 10 mL
  Filled 2014-03-27: qty 10

## 2014-03-27 NOTE — Progress Notes (Signed)
Hematology and Oncology Follow Up Visit  Makayla Fisher [id number] [birth year] 80 y.o. 03/27/2014   Principle Diagnosis:   Follicular large cell non-Hodgkin's lymphoma  Current Therapy:    Status post 5 cycles of Rituxan/bendamustine     Interim History:  Makayla Fisher is back for followup. She is doing quite well. She is having trouble with her ostomy site. She sees Makayla Fisher of surgery in a couple weeks.  She does have some abdominal discomfort. She's had no bleeding. She has had loose stools. I did give her Donnatal but somehow this was stopped at her nursing home. We have to get that back to her which I think will help.  She's had no cough. She's had no rashes. There's been no leg swelling.  She really needs some physical therapy. I told her that the doctor who rounds on her at the nursing home should be able to order this for her.  Overall, her performance status is ECOG 1.  I think it would be a very good idea and would help her quality of life to have the ostomy reversed if Makayla Fisher thinks this is possible. I would think that her blood counts will be  Medications: Current outpatient prescriptions: acetaminophen (TYLENOL) 500 MG tablet, Take 500 mg by mouth every 6 (six) hours as needed for mild pain., Disp: , Rfl: ;  amLODipine (NORVASC) 5 MG tablet, Take 5 mg by mouth every morning. , Disp: , Rfl: ;  [place] alk-PHENObarbital (DONNATAL) 16.2 MG tablet, Take 1 pill every 6 hrs if needed for abdominal pain, Disp: 60 tablet, Rfl: 2 carvedilol (COREG) 3.125 MG tablet, Take 3.125 mg by mouth daily. , Disp: , Rfl: ;  Cholecalciferol (VITAMIN D3) 2000 UNITS TABS, Take by mouth every morning., Disp: , Rfl: ;  cycloSPORINE (RESTASIS) 0.05 % ophthalmic emulsion, Place 1 drop into both eyes 2 (two) times daily. , Disp: , Rfl: ;  docusate sodium (COLACE) 100 MG capsule, Take 100 mg by mouth daily. , Disp: , Rfl:  ezetimibe (ZETIA) 10 MG tablet, Take 10 mg by mouth every morning. ,  Disp: , Rfl: ;  famotidine (PEPCID) 20 MG tablet, Take 1 tablet (20 mg total) by mouth 2 (two) times daily., Disp: 60 tablet, Rfl: 6;  ferrous sulfate 325 (65 FE) MG tablet, Take 325 mg by mouth 2 (two) times daily with a meal., Disp: , Rfl: ;  furosemide (LASIX) 20 MG tablet, Take 20 mg by mouth every morning. , Disp: , Rfl:  levothyroxine (SYNTHROID, LEVOTHROID) 75 MCG tablet, Take 75 mcg by mouth daily before breakfast., Disp: , Rfl: ;  lisinopril (PRINIVIL,ZESTRIL) 20 MG tablet, Take 20 mg by mouth 2 (two) times daily., Disp: , Rfl: ;  Multiple Vitamin (MULTIVITAMIN WITH MINERALS) TABS tablet, Take 1 tablet by mouth daily., Disp: , Rfl:  ondansetron (ZOFRAN) 8 MG tablet, Take 1 tablet (8 mg total) by mouth 2 (two) times daily. For 4 days following chemo, Disp: 20 tablet, Rfl: 2;  potassium chloride SA (K-DUR,KLOR-CON) 20 MEQ tablet, Take 1 tablet (20 mEq total) by mouth daily., Disp: 30 tablet, Rfl: 2;  prochlorperazine (COMPAZINE) 10 MG tablet, Take 0.5 tablets (5 mg total) by mouth every 6 (six) hours as needed for nausea or vomiting., Disp: 30 tablet, Rfl: 3 rOPINIRole (REQUIP) 1 MG tablet, Take 1 mg by mouth at bedtime., Disp: , Rfl: ;  sertraline (ZOLOFT) 25 MG tablet, Take 25 mg by mouth every morning. , Disp: , Rfl: ;  simvastatin (ZOCOR)  20 MG tablet, Take 20 mg by mouth every evening. , Disp: , Rfl: ;  Tiotropium Bromide Monohydrate (SPIRIVA RESPIMAT) 2.5 MCG/ACT AERS, Inhale 1 puff into the lungs daily., Disp: , Rfl:  traMADol (ULTRAM) 50 MG tablet, Take 50 mg by mouth every 6 (six) hours as needed for moderate pain., Disp: , Rfl: ;  vitamin C (ASCORBIC ACID) 250 MG tablet, Take 250 mg by mouth daily., Disp: , Rfl:   Allergies:  Allergies  Allergen Reactions  . Epipen [Epinephrine Hcl]     Heart palpitations  . Heparin     Unsure of side affects (maybe affected platelets per pt)  . Levaquin [Levofloxacin In D5w] Other (See Comments)    Weakness  . Sulfa Antibiotics Rash    Past  Medical History, Surgical history, Social history, and Family History were reviewed and updated.  Review of Systems: As above  Physical Exam:  height is 5' 2"  (1.575 m) and weight is 158 lb (71.668 kg). Her oral temperature is 97.8 F (36.6 C). Her blood pressure is 123/48 and her pulse is 75. Her respiration is 14.   Elderly white female. Head and neck exam shows no ocular or oral lesions. There are no palpable cervical or supraclavicular lymph nodes. Lungs are clear bilaterally. Cardiac exam regular rate and rhythm with no murmurs rubs or bruits. Abdomen is soft. She has good bowel sounds. The ostomy is intact. There is no stool in the ostomy bag. There is no palpable liver or spleen tip. Her laparotomy scars are well-healed. Back exam shows no tenderness over the spine ribs or hips. Extremities shows no clubbing cyanosis or edema. Skin exam no rashes, ecchymoses or petechia. She does have some areas of Candida in the skin folds of her abdomen neurological exam is nonfocal..   Lab Results  Component Value Date   WBC 3.5* 02/27/2014   HGB 12.5 02/27/2014   HCT 37.4 02/27/2014   MCV 90 02/27/2014   PLT 114* 02/27/2014     Chemistry      Component Value Date/Time   NA 139 02/27/2014 0825   NA 134* 09/25/2013 1141   K 4.1 02/27/2014 0825   K 4.1 09/25/2013 1141   CL 103 02/27/2014 0825   CL 98 09/25/2013 1141   CO2 24 02/27/2014 0825   CO2 25 09/25/2013 1141   BUN 14 02/27/2014 0825   BUN 13 09/25/2013 1141   CREATININE 0.7 02/27/2014 0825   CREATININE 0.61 09/25/2013 1141      Component Value Date/Time   CALCIUM 9.3 02/27/2014 0825   CALCIUM 9.4 09/25/2013 1141   ALKPHOS 55 02/27/2014 0825   ALKPHOS 90 09/25/2013 1141   AST 29 02/27/2014 0825   AST 15 09/25/2013 1141   ALT 14 02/27/2014 0825   ALT <8 09/25/2013 1141   BILITOT 0.50 02/27/2014 0825   BILITOT 0.4 09/25/2013 1141         Impression and Plan: Makayla Fisher is 80 year old female. She has a follicular  large cell lymphoma. This is of the abdomen. She's had 5 cycles of chemotherapy. She's done incredibly well. Her PET scan looks wonderful.  I think, at this point in time, we can probably just do 6 cycles of treatment. we will  go ahead with her  6th  cycle of chemotherapy today.  I will go ahead and set up with a PET scan in about 4-5 weeks.  If she is to have reversal of the ostomy, I do not see a problem  with this. This probably will make her life whole lot better.  I probably would set her up with maintenance Rituxan. I think that we certainly be reasonable..   I will plan to see her back in about 6 weeks. [name], MD 12/3/20158:52 AM

## 2014-03-27 NOTE — Patient Instructions (Signed)
[facility] Discharge Instructions for Patients Receiving Chemotherapy  Today you received the following chemotherapy agents Rituxan and Treanda. To help prevent nausea and vomiting after your treatment, we encourage you to take your nausea medication as prescribed.   If you develop nausea and vomiting that is not controlled by your nausea medication, call the clinic.   BELOW ARE SYMPTOMS THAT SHOULD BE REPORTED IMMEDIATELY:  *FEVER GREATER THAN 100.5 F  *CHILLS WITH OR WITHOUT FEVER  NAUSEA AND VOMITING THAT IS NOT CONTROLLED WITH YOUR NAUSEA MEDICATION  *UNUSUAL SHORTNESS OF BREATH  *UNUSUAL BRUISING OR BLEEDING  TENDERNESS IN MOUTH AND THROAT WITH OR WITHOUT PRESENCE OF ULCERS  *URINARY PROBLEMS  *BOWEL PROBLEMS  UNUSUAL RASH Items with * indicate a potential emergency and should be followed up as soon as possible.  Feel free to call the clinic you have any questions or concerns. The clinic phone number is [phone].    

## 2014-03-28 ENCOUNTER — Ambulatory Visit (HOSPITAL_BASED_OUTPATIENT_CLINIC_OR_DEPARTMENT_OTHER): Payer: Medicare Other

## 2014-03-28 DIAGNOSIS — C8590 Non-Hodgkin lymphoma, unspecified, unspecified site: Secondary | ICD-10-CM

## 2014-03-28 DIAGNOSIS — Z5111 Encounter for antineoplastic chemotherapy: Secondary | ICD-10-CM

## 2014-03-28 DIAGNOSIS — C8280 Other types of follicular lymphoma, unspecified site: Secondary | ICD-10-CM

## 2014-03-28 MED ORDER — DEXAMETHASONE SODIUM PHOSPHATE 10 MG/ML IJ SOLN
INTRAMUSCULAR | Status: AC
  Filled 2014-03-28: qty 1

## 2014-03-28 MED ORDER — ACETAMINOPHEN 500 MG PO TABS
500.0000 mg | ORAL_TABLET | Freq: Once | ORAL | Status: AC
  Administered 2014-03-28: 500 mg via ORAL

## 2014-03-28 MED ORDER — DEXAMETHASONE SODIUM PHOSPHATE 10 MG/ML IJ SOLN
10.0000 mg | Freq: Once | INTRAMUSCULAR | Status: AC
  Administered 2014-03-28: 10 mg via INTRAVENOUS

## 2014-03-28 MED ORDER — ONDANSETRON 8 MG/50ML IVPB (CHCC)
8.0000 mg | Freq: Once | INTRAVENOUS | Status: AC
  Administered 2014-03-28: 8 mg via INTRAVENOUS

## 2014-03-28 MED ORDER — HEPARIN SOD (PORK) LOCK FLUSH 100 UNIT/ML IV SOLN
500.0000 [IU] | Freq: Once | INTRAVENOUS | Status: AC | PRN
  Administered 2014-03-28: 500 [IU]
  Filled 2014-03-28: qty 5

## 2014-03-28 MED ORDER — SODIUM CHLORIDE 0.9 % IV SOLN
72.0000 mg/m2 | Freq: Once | INTRAVENOUS | Status: AC
  Administered 2014-03-28: 135 mg via INTRAVENOUS
  Filled 2014-03-28: qty 1.5

## 2014-03-28 MED ORDER — SODIUM CHLORIDE 0.9 % IJ SOLN
10.0000 mL | INTRAMUSCULAR | Status: DC | PRN
  Administered 2014-03-28: 10 mL
  Filled 2014-03-28: qty 10

## 2014-03-28 MED ORDER — SODIUM CHLORIDE 0.9 % IV SOLN
Freq: Once | INTRAVENOUS | Status: AC
  Administered 2014-03-28: 10:00:00 via INTRAVENOUS

## 2014-03-28 MED ORDER — ACETAMINOPHEN 500 MG PO TABS
ORAL_TABLET | ORAL | Status: AC
  Filled 2014-03-28: qty 1

## 2014-03-28 NOTE — Patient Instructions (Signed)
[facility] Discharge Instructions for Patients Receiving Chemotherapy  Today you received the following chemotherapy agents: Treanda.  To help prevent nausea and vomiting after your treatment, we encourage you to take your nausea medication as prescribed.   If you develop nausea and vomiting that is not controlled by your nausea medication, call the clinic.   BELOW ARE SYMPTOMS THAT SHOULD BE REPORTED IMMEDIATELY:  *FEVER GREATER THAN 100.5 F  *CHILLS WITH OR WITHOUT FEVER  NAUSEA AND VOMITING THAT IS NOT CONTROLLED WITH YOUR NAUSEA MEDICATION  *UNUSUAL SHORTNESS OF BREATH  *UNUSUAL BRUISING OR BLEEDING  TENDERNESS IN MOUTH AND THROAT WITH OR WITHOUT PRESENCE OF ULCERS  *URINARY PROBLEMS  *BOWEL PROBLEMS  UNUSUAL RASH Items with * indicate a potential emergency and should be followed up as soon as possible.  Feel free to call the clinic you have any questions or concerns. The clinic phone number is [phone].    

## 2014-04-07 NOTE — Progress Notes (Signed)
Patient ID: Makayla Fisher, female   DOB: 03-Mar-1923, 80 y.o.   MRN: [id number]    80 yo referred for preop clearance Has follicular large cell lymphoma of abdomen.  Ongoing Rx with chemo by Dr Marin Olp.  Has ostomy and wants to have It reversed  It is not external , leaks stool and gets irritated often  Colostomy done 2014 She underwent an open right colectomy and sigmoid colectomy with ileotransverse anastomosis and end descending colostomy with Hartmann's pouch. She had what sounds like a very long protracted post operative course requiring mechanical ventilation.  It also appears that she went into acute renal failure. Surgery was done emergently due to diverticular perforation with stricture.  PET scan 8/15 showed near total resolution of cancer in abdomen and repeat scan planned for January  Distant history of CAD  With stent /CABG in [facility]  Records not available in [place]  Echo done 3/15 for chemo in [place] showed normal EF , abnormal relaxation and mild MR    ROS: Denies fever, malais, weight loss, blurry vision, decreased visual acuity, cough, sputum, SOB, hemoptysis, pleuritic pain, palpitaitons, heartburn, abdominal pain, melena, lower extremity edema, claudication, or rash.  All other systems reviewed and negative   General: Affect appropriate Elderly spry female  HEENT: normal Neck supple with no adenopathy JVP normal no bruits no thyromegaly Lungs clear with no wheezing and good diaphragmatic motion Heart:  S1/S2 SEM  murmur,rub, gallop or click previous sternotomy PMI normal Abdomen: benighn, BS positve, no tenderness, no AAA ostomy with bag LLQ no bruit.  No HSM or HJR Distal pulses intact with no bruits No edema Neuro non-focal Skin warm and dry No muscular weakness  Medications Current Outpatient Prescriptions  Medication Sig Dispense Refill  . acetaminophen (TYLENOL) 500 MG tablet Take 500 mg by mouth every 6 (six) hours as  needed for mild pain.    Marland Kitchen amLODipine (NORVASC) 5 MG tablet Take 5 mg by mouth every morning.     [place] alk-PHENObarbital (DONNATAL) 16.2 MG tablet Take 1 pill every 6 hrs if needed for abdominal pain 60 tablet 2  . carvedilol (COREG) 3.125 MG tablet Take 3.125 mg by mouth daily.     . Cholecalciferol (VITAMIN D3) 2000 UNITS TABS Take by mouth every morning.    . cycloSPORINE (RESTASIS) 0.05 % ophthalmic emulsion Place 1 drop into both eyes 2 (two) times daily.     Marland Kitchen docusate sodium (COLACE) 100 MG capsule Take 100 mg by mouth daily.     Marland Kitchen ezetimibe (ZETIA) 10 MG tablet Take 10 mg by mouth every morning.     . famotidine (PEPCID) 20 MG tablet Take 1 tablet (20 mg total) by mouth 2 (two) times daily. 60 tablet 6  . ferrous sulfate 325 (65 FE) MG tablet Take 325 mg by mouth 2 (two) times daily with a meal.    . fluconazole (DIFLUCAN) 100 MG tablet Take 1 tablet (100 mg total) by mouth daily. 10 tablet 2  . furosemide (LASIX) 20 MG tablet Take 20 mg by mouth every morning.     Marland Kitchen levothyroxine (SYNTHROID, LEVOTHROID) 75 MCG tablet Take 75 mcg by mouth daily before breakfast.    . lisinopril (PRINIVIL,ZESTRIL) 20 MG tablet Take 20 mg by mouth 2 (two) times daily.    . Multiple Vitamin (MULTIVITAMIN WITH MINERALS) TABS tablet Take 1 tablet by mouth daily.    . ondansetron (ZOFRAN) 8 MG tablet Take 1 tablet (8 mg  total) by mouth 2 (two) times daily. For 4 days following chemo 20 tablet 2  . potassium chloride SA (K-DUR,KLOR-CON) 20 MEQ tablet Take 1 tablet (20 mEq total) by mouth daily. 30 tablet 2  . prochlorperazine (COMPAZINE) 10 MG tablet Take 0.5 tablets (5 mg total) by mouth every 6 (six) hours as needed for nausea or vomiting. 30 tablet 3  . rOPINIRole (REQUIP) 1 MG tablet Take 1 mg by mouth at bedtime.    . sertraline (ZOLOFT) 25 MG tablet Take 25 mg by mouth every morning.     . simvastatin (ZOCOR) 20 MG tablet Take 20 mg by mouth every evening.     . Tiotropium Bromide Monohydrate  (SPIRIVA RESPIMAT) 2.5 MCG/ACT AERS Inhale 1 puff into the lungs daily.    . traMADol (ULTRAM) 50 MG tablet Take 50 mg by mouth every 6 (six) hours as needed for moderate pain.    . vitamin C (ASCORBIC ACID) 250 MG tablet Take 250 mg by mouth daily.     No current facility-administered medications for this visit.    Allergies Epipen; Heparin; Levaquin; and Sulfa antibiotics  Family History: Family History  Problem Relation Age of Onset  . Diverticulitis Mother   . Heart attack Mother   . Ulcers Father     Social History: History   Social History  . Marital Status: Married    Spouse Name: N/A    Number of Children: N/A  . Years of Education: N/A   Occupational History  . Not on file.   Social History Main Topics  . Smoking status: Former Smoker -- 1.00 packs/day for 25 years    Types: Cigarettes    Start date: 05/16/1948    Quit date: 05/16/1973  . Smokeless tobacco: Never Used     Comment: quit smoking 40 years ago  . Alcohol Use: No  . Drug Use: No  . Sexual Activity: Not on file   Other Topics Concern  . Not on file   Social History Narrative    Past Surgical History  Procedure Laterality Date  . Colon surgery  2014    Colostomy  . Cardiac surgery  2009  . Coronary angioplasty with stent placement  2009  . C section  1947, 1963  . Cholecystectomy  1973  . Esophagogastroduodenoscopy  2013    Past Medical History  Diagnosis Date  . Heart attack   . NHL (non-Hodgkin's lymphoma) 10/18/2013    Electrocardiogram:  4/27  SR rate 71  LAE no old MI  Low voltage   Assessment and Plan

## 2014-04-08 ENCOUNTER — Encounter: Payer: Self-pay | Admitting: Cardiovascular Disease

## 2014-04-08 ENCOUNTER — Ambulatory Visit (INDEPENDENT_AMBULATORY_CARE_PROVIDER_SITE_OTHER): Payer: Medicare Other | Admitting: Cardiovascular Disease

## 2014-04-08 VITALS — BP 124/58 | HR 78 | Ht 61.0 in | Wt 168.8 lb

## 2014-04-08 DIAGNOSIS — C8590 Non-Hodgkin lymphoma, unspecified, unspecified site: Secondary | ICD-10-CM

## 2014-04-08 DIAGNOSIS — Z01810 Encounter for preprocedural cardiovascular examination: Secondary | ICD-10-CM

## 2014-04-08 DIAGNOSIS — R0989 Other specified symptoms and signs involving the circulatory and respiratory systems: Secondary | ICD-10-CM

## 2014-04-08 NOTE — Assessment & Plan Note (Signed)
Post chemo with RIJ prot a cath in place Last PET scan free of disease F/U PET January  If this continues to show improvement reasonable to proceed with surgery for ostomy.  If she has recurrence of disease in abdomen would think twice about any further surgery

## 2014-04-08 NOTE — Assessment & Plan Note (Addendum)
Her biggest risk factor for complications from general anesthesia/surgery is her age.  She and her caretaker make it clear that they are not happy with the current functioning or her internal ostomy and is causes significant quality of life issues.  Despite multi organ failure post op 2014 she had no cardiac complications.  She has no chest pain.  I think that the middle ground of externalizing and moving ostomy would be reasonable to address her quality of life issues and not carry the same risk as trying to reconnect her bowels  She has a history of carotid disease and will need duplex to r/o high grade stenosis beifre proceding  She also need f/u PET scan next month so I suspect surgery wouldn't happen until February.

## 2014-04-08 NOTE — Patient Instructions (Signed)
Your physician recommends that you schedule a follow-up appointment in: [place] Your physician recommends that you continue on your current medications as directed. Please refer to the Current Medication list given to you today. Your physician has requested that you have a carotid duplex. This test is an ultrasound of the carotid arteries in your neck. It looks at blood flow through these arteries that supply the brain with blood. Allow one hour for this exam. There are no restrictions or special instructions.

## 2014-04-09 ENCOUNTER — Ambulatory Visit (HOSPITAL_COMMUNITY): Payer: Medicare Other | Attending: Cardiovascular Disease | Admitting: *Deleted

## 2014-04-09 DIAGNOSIS — R0989 Other specified symptoms and signs involving the circulatory and respiratory systems: Secondary | ICD-10-CM | POA: Insufficient documentation

## 2014-04-09 DIAGNOSIS — I2510 Atherosclerotic heart disease of native coronary artery without angina pectoris: Secondary | ICD-10-CM | POA: Insufficient documentation

## 2014-04-09 DIAGNOSIS — Z87891 Personal history of nicotine dependence: Secondary | ICD-10-CM | POA: Diagnosis not present

## 2014-04-09 DIAGNOSIS — I10 Essential (primary) hypertension: Secondary | ICD-10-CM | POA: Diagnosis not present

## 2014-04-09 DIAGNOSIS — I6523 Occlusion and stenosis of bilateral carotid arteries: Secondary | ICD-10-CM

## 2014-04-09 NOTE — Progress Notes (Signed)
Carotid duplex complete 

## 2014-04-21 ENCOUNTER — Telehealth: Payer: Self-pay | Admitting: Cardiovascular Disease

## 2014-04-21 DIAGNOSIS — I6522 Occlusion and stenosis of left carotid artery: Secondary | ICD-10-CM

## 2014-04-21 NOTE — Telephone Encounter (Signed)
Patient informed of duplex results and verbal understanding expressed.  Repeat duplex ordered for scheduling in 1 year. Results sent to Dr. Marcello Moores per patient request.

## 2014-04-21 NOTE — Telephone Encounter (Signed)
New message    Can leave message on voice mail    Calling for test results & send copy patient , Dr. [name]    1. Are you calling in reference to your FMLA or disability form? no  2. What is your question in regards to FMLA or disability form? no   3. Do you need copies of your medical records? Yes   4. Are you waiting on a nurse to call you back with results or are you wanting copies of your results? Yes

## 2014-05-01 ENCOUNTER — Ambulatory Visit (HOSPITAL_COMMUNITY)
Admission: RE | Admit: 2014-05-01 | Discharge: 2014-05-01 | Disposition: A | Discharge status: Home / Self Care | Payer: Medicare Other | Source: Ambulatory Visit | Attending: Hematology & Oncology | Admitting: Hematology & Oncology

## 2014-05-01 ENCOUNTER — Encounter (HOSPITAL_COMMUNITY): Payer: Self-pay

## 2014-05-01 DIAGNOSIS — Z9221 Personal history of antineoplastic chemotherapy: Secondary | ICD-10-CM | POA: Insufficient documentation

## 2014-05-01 DIAGNOSIS — C8590 Non-Hodgkin lymphoma, unspecified, unspecified site: Secondary | ICD-10-CM | POA: Diagnosis not present

## 2014-05-01 LAB — GLUCOSE, CAPILLARY: Glucose-Capillary: 89 mg/dL (ref 70–99)

## 2014-05-01 MED ORDER — FLUDEOXYGLUCOSE F - 18 (FDG) INJECTION
8.3800 | Freq: Once | INTRAVENOUS | Status: AC | PRN
  Administered 2014-05-01: 8.38 via INTRAVENOUS

## 2014-05-02 ENCOUNTER — Telehealth: Payer: Self-pay | Admitting: Nurse Practitioner

## 2014-05-02 NOTE — Telephone Encounter (Addendum)
-----   Message from [name], MD sent at 05/01/2014  3:54 PM EST -----   Please call her and tell her that there is no active lymphoma. As always, this is God's will for her.  Makayla Fisher  Pt verbalized understanding and appreciation.

## 2014-05-08 ENCOUNTER — Encounter: Payer: Self-pay | Admitting: Hematology & Oncology

## 2014-05-08 ENCOUNTER — Other Ambulatory Visit (HOSPITAL_BASED_OUTPATIENT_CLINIC_OR_DEPARTMENT_OTHER): Payer: Medicare Other | Admitting: Lab

## 2014-05-08 ENCOUNTER — Ambulatory Visit (HOSPITAL_BASED_OUTPATIENT_CLINIC_OR_DEPARTMENT_OTHER): Payer: Medicare Other | Admitting: Hematology & Oncology

## 2014-05-08 VITALS — BP 132/52 | HR 70 | Temp 97.9°F | Resp 16 | Ht 61.0 in | Wt 157.0 lb

## 2014-05-08 DIAGNOSIS — C8580 Other specified types of non-Hodgkin lymphoma, unspecified site: Secondary | ICD-10-CM

## 2014-05-08 DIAGNOSIS — C8590 Non-Hodgkin lymphoma, unspecified, unspecified site: Secondary | ICD-10-CM

## 2014-05-08 LAB — COMPREHENSIVE METABOLIC PANEL (CC13)
ALBUMIN: 3.6 g/dL (ref 3.5–5.0)
ALK PHOS: 66 U/L (ref 40–150)
ALT: 10 U/L (ref 0–55)
ANION GAP: 9 meq/L (ref 3–11)
AST: 23 U/L (ref 5–34)
BUN: 14.2 mg/dL (ref 7.0–26.0)
CHLORIDE: 108 meq/L (ref 98–109)
CO2: 24 meq/L (ref 22–29)
Calcium: 9.2 mg/dL (ref 8.4–10.4)
Creatinine: 0.8 mg/dL (ref 0.6–1.1)
EGFR: 71 mL/min/{1.73_m2} — ABNORMAL LOW (ref >90)
Glucose: 94 mg/dl (ref 70–140)
Potassium: 4.5 mEq/L (ref 3.5–5.1)
Sodium: 141 mEq/L (ref 136–145)
TOTAL PROTEIN: 6.1 g/dL — AB (ref 6.4–8.3)
Total Bilirubin: 0.42 mg/dL (ref 0.20–1.20)

## 2014-05-08 LAB — CBC WITH DIFFERENTIAL (CANCER CENTER ONLY)
BASO#: 0 10*3/uL (ref 0.0–0.2)
BASO%: 0.7 % (ref 0.0–2.0)
EOS%: 6.2 % (ref 0.0–7.0)
Eosinophils Absolute: 0.2 10*3/uL (ref 0.0–0.5)
HCT: 36.1 % (ref 34.8–46.6)
HGB: 11.8 g/dL (ref 11.6–15.9)
LYMPH#: 0.8 10*3/uL — ABNORMAL LOW (ref 0.9–3.3)
LYMPH%: 26.6 % (ref 14.0–48.0)
MCH: 31.1 pg (ref 26.0–34.0)
MCHC: 32.7 g/dL (ref 32.0–36.0)
MCV: 95 fL (ref 81–101)
MONO#: 0.8 10*3/uL (ref 0.1–0.9)
MONO%: 26.9 % — ABNORMAL HIGH (ref 0.0–13.0)
NEUT#: 1.2 10*3/uL — ABNORMAL LOW (ref 1.5–6.5)
NEUT%: 39.6 % (ref 39.6–80.0)
Platelets: 119 10*3/uL — ABNORMAL LOW (ref 145–400)
RBC: 3.79 10*6/uL (ref 3.70–5.32)
RDW: 16.9 % — ABNORMAL HIGH (ref 11.1–15.7)
WBC: 3.1 10*3/uL — ABNORMAL LOW (ref 3.9–10.0)

## 2014-05-08 LAB — LACTATE DEHYDROGENASE: LDH: 158 U/L (ref 94–250)

## 2014-05-08 NOTE — Progress Notes (Signed)
Hematology and Oncology Follow Up Visit  Makayla Fisher [id number] 11/18/1922 81 y.o. 05/08/2014   Principle Diagnosis:   Follicular large cell non-Hodgkin's lymphoma  Current Therapy:    Status post 6 cycles of Rituxan/bendamustine     Interim History:  [name] is back for followup. She completed all of her chemotherapy last month. She actually did quite well with it.  We did go ahead and repeat a PET scan on her. The PET scan did not show any evidence of active lymphoma. She still has the abdominal nodule. However, there is no activity on PET scan.  The really issue now is whether not she will want have her colostomy reversed. She sees Dr. Marcello Moores for this. I told her that the decision is hers. She's having diarrhea now through the colostomy area and I'll note that would change if she has a colostomy reversed.  She sees be done pretty well. She had a good Christmas.  She's been eating well. She's had no nausea or vomiting. There's been no abdominal pain. She's had no cough. She's had no fever. She's had no shortness of breath. Per there's been no leg swelling.  She's had no rashes.  Overall, her performance status is ECOG 1-2.  Medications:  Current outpatient prescriptions:  .  acetaminophen (TYLENOL) 500 MG tablet, Take 500 mg by mouth every 6 (six) hours as needed for mild pain., Disp: , Rfl:  .  amLODipine (NORVASC) 5 MG tablet, Take 5 mg by mouth every morning. , Disp: , Rfl:  .  carvedilol (COREG) 3.125 MG tablet, Take 3.125 mg by mouth daily. , Disp: , Rfl:  .  UNKNOWN TO PATIENT, MEDICATION LIST GIVEN TO PATIENT FOR CLARIFICATION WITH NURSE AT ASSISTED LIVING FACILITY, WILL FAX BACK TO [place] CORRECT MEDICATION., Disp: , Rfl:  .  [place] alk-PHENObarbital (DONNATAL) 16.2 MG tablet, Take 1 pill every 6 hrs if needed for abdominal pain (Patient not taking: Reported on 05/08/2014), Disp: 60 tablet, Rfl: 2 .  Cholecalciferol (VITAMIN D3) 2000 UNITS TABS, Take by mouth  every morning., Disp: , Rfl:  .  cycloSPORINE (RESTASIS) 0.05 % ophthalmic emulsion, Place 1 drop into both eyes 2 (two) times daily. , Disp: , Rfl:  .  ezetimibe (ZETIA) 10 MG tablet, Take 10 mg by mouth every morning. , Disp: , Rfl:  .  famotidine (PEPCID) 20 MG tablet, Take 1 tablet (20 mg total) by mouth 2 (two) times daily., Disp: 60 tablet, Rfl: 6 .  fluconazole (DIFLUCAN) 100 MG tablet, Take 1 tablet (100 mg total) by mouth daily., Disp: 10 tablet, Rfl: 2 .  furosemide (LASIX) 20 MG tablet, Take 20 mg by mouth every morning. , Disp: , Rfl:  .  levothyroxine (SYNTHROID, LEVOTHROID) 75 MCG tablet, Take 75 mcg by mouth daily before breakfast., Disp: , Rfl:  .  lisinopril (PRINIVIL,ZESTRIL) 20 MG tablet, Take 20 mg by mouth 2 (two) times daily., Disp: , Rfl:  .  Multiple Vitamin (MULTIVITAMIN WITH MINERALS) TABS tablet, Take 1 tablet by mouth daily., Disp: , Rfl:  .  ondansetron (ZOFRAN) 8 MG tablet, Take 1 tablet (8 mg total) by mouth 2 (two) times daily. For 4 days following chemo, Disp: 20 tablet, Rfl: 2 .  potassium chloride SA (K-DUR,KLOR-CON) 20 MEQ tablet, Take 1 tablet (20 mEq total) by mouth daily., Disp: 30 tablet, Rfl: 2 .  prochlorperazine (COMPAZINE) 10 MG tablet, Take 0.5 tablets (5 mg total) by mouth every 6 (six) hours as needed for nausea or  vomiting., Disp: 30 tablet, Rfl: 3 .  rOPINIRole (REQUIP) 1 MG tablet, Take 1 mg by mouth at bedtime., Disp: , Rfl:  .  sertraline (ZOLOFT) 25 MG tablet, Take 25 mg by mouth every morning. , Disp: , Rfl:  .  simvastatin (ZOCOR) 20 MG tablet, Take 20 mg by mouth every evening. , Disp: , Rfl:  .  Tiotropium Bromide Monohydrate (SPIRIVA RESPIMAT) 2.5 MCG/ACT AERS, Inhale 1 puff into the lungs daily., Disp: , Rfl:  .  traMADol (ULTRAM) 50 MG tablet, Take 50 mg by mouth every 6 (six) hours as needed for moderate pain., Disp: , Rfl:  .  vitamin C (ASCORBIC ACID) 250 MG tablet, Take 250 mg by mouth daily., Disp: , Rfl:   Allergies:  Allergies    Allergen Reactions  . Epipen [Epinephrine Hcl]     Heart palpitations  . Heparin     Unsure of side affects (maybe affected platelets per pt)  . Levaquin [Levofloxacin In D5w] Other (See Comments)    Weakness  . Sulfa Antibiotics Rash    Past Medical History, Surgical history, Social history, and Family History were reviewed and updated.  Review of Systems: As above  Physical Exam:  height is _0  (1.549 m) and weight is 157 lb (71.215 kg). Her oral temperature is 97.9 F (36.6 C). Her blood pressure is 132/52 and her pulse is 70. Her respiration is 16.   Elderly white female. Head and neck exam shows no ocular or oral lesions. There are no palpable cervical or supraclavicular lymph nodes. Lungs are clear bilaterally. Cardiac exam regular rate and rhythm with no murmurs rubs or bruits. Abdomen is soft. She has good bowel sounds. The ostomy is intact. There is no stool in the ostomy bag. There is no palpable liver or spleen tip. Her laparotomy scars are well-healed. Back exam shows no tenderness over the spine ribs or hips. Extremities shows no clubbing cyanosis or edema. Skin exam no rashes, ecchymoses or petechia. She does have some areas of Candida in the skin folds of her abdomen neurological exam is nonfocal..   Lab Results  Component Value Date   WBC 3.1* 05/08/2014   HGB 11.8 05/08/2014   HCT 36.1 05/08/2014   MCV 95 05/08/2014   PLT 119* 05/08/2014     Chemistry      Component Value Date/Time   NA 141 05/08/2014 1041   NA 139 03/27/2014 0822   NA 134* 09/25/2013 1141   K 4.5 05/08/2014 1041   K 3.7 03/27/2014 0822   K 4.1 09/25/2013 1141   CL 102 03/27/2014 0822   CL 98 09/25/2013 1141   CO2 24 05/08/2014 1041   CO2 26 03/27/2014 0822   CO2 25 09/25/2013 1141   BUN 14.2 05/08/2014 1041   BUN 16 03/27/2014 0822   BUN 13 09/25/2013 1141   CREATININE 0.8 05/08/2014 1041   CREATININE 0.8 03/27/2014 0822   CREATININE 0.61 09/25/2013 1141      Component Value  Date/Time   CALCIUM 9.2 05/08/2014 1041   CALCIUM 8.6 03/27/2014 0822   CALCIUM 9.4 09/25/2013 1141   ALKPHOS 66 05/08/2014 1041   ALKPHOS 60 03/27/2014 0822   ALKPHOS 90 09/25/2013 1141   AST 23 05/08/2014 1041   AST 29 03/27/2014 0822   AST 15 09/25/2013 1141   ALT 10 05/08/2014 1041   ALT 15 03/27/2014 0822   ALT <8 09/25/2013 1141   BILITOT 0.42 05/08/2014 1041   BILITOT 0.50 03/27/2014 4008  BILITOT 0.4 09/25/2013 1141         Impression and Plan: Makayla Fisher is 81 year old female. She has a follicular large cell lymphoma.   I would have says she's had a fantastic response to treatment. Overall, I think that she probably would be a decent candidate for maintenance Rituxan. I think that she would benefit from Rituxan. Her doctor her and her niece about this. I explained Rituxan in the maintenance setting and how it is effective and decreases the risk of recurrence.  I spent about 35 minutes with her. I answered all of her questions. I told her that we would not do any routine scans on her unless she has new symptoms or her lab work is abnormal.  We will set her up with a dose of Rituxan in about 3 weeks.  I think we can probably do every three-month Rituxan.  I will plan to see her back in April at which point we will coordinate the doctor visits with Rituxan.  If she does decide on the colostomy reversal, being on Rituxan should not affect this.   [name], MD 1/14/20162:39 PM

## 2014-05-16 ENCOUNTER — Encounter (HOSPITAL_COMMUNITY): Payer: Self-pay | Admitting: *Deleted

## 2014-05-16 ENCOUNTER — Emergency Department (HOSPITAL_COMMUNITY): Payer: Medicare Other

## 2014-05-16 ENCOUNTER — Emergency Department (HOSPITAL_COMMUNITY)
Admission: EM | Admit: 2014-05-16 | Discharge: 2014-05-17 | Disposition: A | Discharge status: Home / Self Care | Payer: Medicare Other | Attending: Emergency Medicine | Admitting: Emergency Medicine

## 2014-05-16 DIAGNOSIS — S0083XA Contusion of other part of head, initial encounter: Secondary | ICD-10-CM | POA: Diagnosis not present

## 2014-05-16 DIAGNOSIS — W19XXXA Unspecified fall, initial encounter: Secondary | ICD-10-CM

## 2014-05-16 DIAGNOSIS — Y92129 Unspecified place in nursing home as the place of occurrence of the external cause: Secondary | ICD-10-CM | POA: Insufficient documentation

## 2014-05-16 DIAGNOSIS — Y9389 Activity, other specified: Secondary | ICD-10-CM | POA: Insufficient documentation

## 2014-05-16 DIAGNOSIS — Z8579 Personal history of other malignant neoplasms of lymphoid, hematopoietic and related tissues: Secondary | ICD-10-CM | POA: Insufficient documentation

## 2014-05-16 DIAGNOSIS — W1839XA Other fall on same level, initial encounter: Secondary | ICD-10-CM | POA: Diagnosis not present

## 2014-05-16 DIAGNOSIS — S8001XA Contusion of right knee, initial encounter: Secondary | ICD-10-CM | POA: Insufficient documentation

## 2014-05-16 DIAGNOSIS — S0990XA Unspecified injury of head, initial encounter: Secondary | ICD-10-CM | POA: Diagnosis present

## 2014-05-16 DIAGNOSIS — Z87891 Personal history of nicotine dependence: Secondary | ICD-10-CM | POA: Diagnosis not present

## 2014-05-16 DIAGNOSIS — Z9861 Coronary angioplasty status: Secondary | ICD-10-CM | POA: Insufficient documentation

## 2014-05-16 DIAGNOSIS — Z79899 Other long term (current) drug therapy: Secondary | ICD-10-CM | POA: Insufficient documentation

## 2014-05-16 DIAGNOSIS — Y998 Other external cause status: Secondary | ICD-10-CM | POA: Insufficient documentation

## 2014-05-16 DIAGNOSIS — I252 Old myocardial infarction: Secondary | ICD-10-CM | POA: Insufficient documentation

## 2014-05-16 NOTE — Progress Notes (Signed)
CSW met with patient at bedside. There was no family present. Patient confirms that she comes from [place] and that she fell while there today. Patient states she can do all ADL's independently. Patient says her fall was caused while tieing her shoes and lost balance.  Patient appears to have a bruise on her forehead. Patient states that he knee aches.  Patient informed CSW that she was thirsty. CSW made nurse aware.  Willette Brace [phone] ED CSW 05/16/2014 9:43 PM

## 2014-05-16 NOTE — Discharge Instructions (Signed)
Contusion A contusion is a deep bruise. Contusions are the result of an injury that caused bleeding under the skin. The contusion may turn blue, purple, or yellow. Minor injuries will give you a painless contusion, but more severe contusions may stay painful and swollen for a few weeks.  CAUSES  A contusion is usually caused by a blow, trauma, or direct force to an area of the body. SYMPTOMS   Swelling and redness of the injured area.  Bruising of the injured area.  Tenderness and soreness of the injured area.  Pain. DIAGNOSIS  The diagnosis can be made by taking a history and physical exam. An X-ray, CT scan, or MRI may be needed to determine if there were any associated injuries, such as fractures. TREATMENT  Specific treatment will depend on what area of the body was injured. In general, the best treatment for a contusion is resting, icing, elevating, and applying cold compresses to the injured area. Over-the-counter medicines may also be recommended for pain control. Ask your caregiver what the best treatment is for your contusion. HOME CARE INSTRUCTIONS   Put ice on the injured area.  Put ice in a plastic bag.  Place a towel between your skin and the bag.  Leave the ice on for 15-20 minutes, 3-4 times a day, or as directed by your health care provider.  Only take over-the-counter or prescription medicines for pain, discomfort, or fever as directed by your caregiver. Your caregiver may recommend avoiding anti-inflammatory medicines (aspirin, ibuprofen, and naproxen) for 48 hours because these medicines may increase bruising.  Rest the injured area.  If possible, elevate the injured area to reduce swelling. SEEK IMMEDIATE MEDICAL CARE IF:   You have increased bruising or swelling.  You have pain that is getting worse.  Your swelling or pain is not relieved with medicines. MAKE SURE YOU:   Understand these instructions.  Will watch your condition.  Will get help right  away if you are not doing well or get worse. Document Released: 01/19/2005 Document Revised: 04/16/2013 Document Reviewed: 02/14/2011 [name] Patient Information 2015 [place], [place]. This information is not intended to replace advice given to you by your health care provider. Make sure you discuss any questions you have with your health care provider.  Head Injury You have received a head injury. It does not appear serious at this time. Headaches and vomiting are common following head injury. It should be easy to awaken from sleeping. Sometimes it is necessary for you to stay in the emergency department for a while for observation. Sometimes admission to the hospital may be needed. After injuries such as yours, most problems occur within the first 24 hours, but side effects may occur up to 7-10 days after the injury. It is important for you to carefully monitor your condition and contact your health care provider or seek immediate medical care if there is a change in your condition. WHAT ARE THE TYPES OF HEAD INJURIES? Head injuries can be as minor as a bump. Some head injuries can be more severe. More severe head injuries include:  A jarring injury to the brain (concussion).  A bruise of the brain (contusion). This mean there is bleeding in the brain that can cause swelling.  A cracked skull (skull fracture).  Bleeding in the brain that collects, clots, and forms a bump (hematoma). WHAT CAUSES A HEAD INJURY? A serious head injury is most likely to happen to someone who is in a car wreck and is not wearing  a seat belt. Other causes of major head injuries include bicycle or motorcycle accidents, sports injuries, and falls. HOW ARE HEAD INJURIES DIAGNOSED? A complete history of the event leading to the injury and your current symptoms will be helpful in diagnosing head injuries. Many times, pictures of the brain, such as CT or MRI are needed to see the extent of the injury. Often, an overnight  hospital stay is necessary for observation.  WHEN SHOULD I SEEK IMMEDIATE MEDICAL CARE?  You should get help right away if:  You have confusion or drowsiness.  You feel sick to your stomach (nauseous) or have continued, forceful vomiting.  You have dizziness or unsteadiness that is getting worse.  You have severe, continued headaches not relieved by medicine. Only take over-the-counter or prescription medicines for pain, fever, or discomfort as directed by your health care provider.  You do not have normal function of the arms or legs or are unable to walk.  You notice changes in the black spots in the center of the colored part of your eye (pupil).  You have a clear or bloody fluid coming from your nose or ears.  You have a loss of vision. During the next 24 hours after the injury, you must stay with someone who can watch you for the warning signs. This person should contact local emergency services (911 in the [place] if you have seizures, you become unconscious, or you are unable to wake up. HOW CAN I PREVENT A HEAD INJURY IN THE FUTURE? The most important factor for preventing major head injuries is avoiding motor vehicle accidents. To minimize the potential for damage to your head, it is crucial to wear seat belts while riding in motor vehicles. Wearing helmets while bike riding and playing collision sports (like football) is also helpful. Also, avoiding dangerous activities around the house will further help reduce your risk of head injury.  WHEN CAN I RETURN TO NORMAL ACTIVITIES AND ATHLETICS? You should be reevaluated by your health care provider before returning to these activities. If you have any of the following symptoms, you should not return to activities or contact sports until 1 week after the symptoms have stopped:  Persistent headache.  Dizziness or vertigo.  Poor attention and concentration.  Confusion.  Memory problems.  Nausea or vomiting.  Fatigue or tire  easily.  Irritability.  Intolerant of bright lights or loud noises.  Anxiety or depression.  Disturbed sleep. MAKE SURE YOU:   Understand these instructions.  Will watch your condition.  Will get help right away if you are not doing well or get worse. Document Released: 04/11/2005 Document Revised: 04/16/2013 Document Reviewed: 12/17/2012 [facility] Patient Information 2015 [place], [place]. This information is not intended to replace advice given to you by your health care provider. Make sure you discuss any questions you have with your health care provider.  Fall Prevention in Hospitals As a hospital patient, your condition and the treatments you receive can increase your risk for falls. Some additional risk factors for falls in a hospital include:  Being in an unfamiliar environment.  Being on bed rest.  Your surgery.  Taking certain medicines.  Your tubing requirements, such as intravenous (IV) therapy or catheters. It is important that you learn how to decrease fall risks while at the hospital. Below are important tips that can help prevent falls. SAFETY TIPS FOR PREVENTING FALLS Talk about your risk of falling.  Ask your caregiver why you are at risk for falling. Is it your  medicine, illness, tubing placement, or something else?  Make a plan with your caregiver to keep you safe from falls.  Ask your caregiver or pharmacist about side effect of your medicines. Some medicines can make you dizzy or affect your coordination. Ask for help.  Ask for help before getting out of bed. You may need to press your call button.  Ask for assistance in getting you safely to the toilet.  Ask for a walker or cane to be put at your bedside. Ask that most of the side rails on your bed be placed up before your caregiver leaves the room.  Ask family or friends to sit with you.  Ask for things that are out of your reach, such as your glasses, hearing aids, telephone, bedside table, or  call button. Follow these tips to avoid falling:  Stay lying or seated, rather than standing, while waiting for help.  Wear rubber-soled slippers or shoes whenever you walk in the hospital.  Avoid quick, sudden movements.  Change positions slowly.  Sit on the side of your bed before standing.  Stand up slowly and wait before you start to walk.  Let your caregiver know if there is a spill on the floor.  Pay careful attention to the medical equipment, electrical cords, and tubes around you.  When you need help, use your call button by your bed or in the bathroom. Wait for one of your caregivers to help you.  If you feel dizzy or unsure of your footing, return to bed and wait for assistance.  Avoid being distracted by the TV, telephone, or another person in your room.  Do not lean or support yourself on rolling objects, such as IV poles or bedside tables. Document Released: 04/08/2000 Document Revised: 03/28/2012 Document Reviewed: 12/18/2011 [facility] Patient Information 2015 [place], [place]. This information is not intended to replace advice given to you by your health care provider. Make sure you discuss any questions you have with your health care provider.

## 2014-05-16 NOTE — ED Notes (Signed)
Patient is from [facility] assisted [facility]

## 2014-05-16 NOTE — ED Provider Notes (Signed)
CSN: [id number]     Arrival date & time 05/16/14  2008 History   First MD Initiated Contact with Patient 05/16/14 2012     Chief Complaint  Patient presents with  . Fall    slip trip     (Consider location/radiation/quality/duration/timing/severity/associated sxs/prior Treatment) HPI Comments: Patient is an 81 year old female who was brought by EMS after a fall at her assisted living facility. She is bending over to put on her slippers when she lost her balance, fell forward, hit her head on the floor. There is no loss of consciousness. She denies any headache, neck pain, or chest pain. She does report a bruise to her right knee. She takes no blood thinners.  Patient is a 81 y.o. female presenting with fall. The history is provided by the patient.  Fall This is a new problem. The current episode started less than 1 hour ago. The problem occurs constantly. The problem has not changed since onset.Pertinent negatives include no chest pain, no headaches and no shortness of breath. Nothing aggravates the symptoms. Nothing relieves the symptoms. She has tried nothing for the symptoms. The treatment provided no relief.    Past Medical History  Diagnosis Date  . Heart attack   . NHL (non-Hodgkin's lymphoma) 10/18/2013   Past Surgical History  Procedure Laterality Date  . Colon surgery  2014    Colostomy  . Cardiac surgery  2009  . Coronary angioplasty with stent placement  2009  . C section  1947, 1963  . Cholecystectomy  1973  . Esophagogastroduodenoscopy  2013   Family History  Problem Relation Age of Onset  . Diverticulitis Mother   . Heart attack Mother   . Ulcers Father    History  Substance Use Topics  . Smoking status: Former Smoker -- 1.00 packs/day for 25 years    Types: Cigarettes    Start date: 05/16/1948    Quit date: 05/16/1973  . Smokeless tobacco: Never Used     Comment: quit smoking 40 years ago  . Alcohol Use: No   OB History    No data available      Review of Systems  Respiratory: Negative for shortness of breath.   Cardiovascular: Negative for chest pain.  Neurological: Negative for headaches.  All other systems reviewed and are negative.     Allergies  Epipen; Heparin; Levaquin; and Sulfa antibiotics  Home Medications   Prior to Admission medications   Medication Sig Start Date End Date Taking? Authorizing Provider  acetaminophen (TYLENOL) 500 MG tablet Take 500 mg by mouth every 6 (six) hours as needed for mild pain.    Historical Provider, MD  amLODipine (NORVASC) 5 MG tablet Take 5 mg by mouth every morning.     Historical Provider, MD  [place] alk-PHENObarbital (DONNATAL) 16.2 MG tablet Take 1 pill every 6 hrs if needed for abdominal pain Patient not taking: Reported on 05/08/2014 02/27/14   [name], MD  carvedilol (COREG) 3.125 MG tablet Take 3.125 mg by mouth daily.     Historical Provider, MD  Cholecalciferol (VITAMIN D3) 2000 UNITS TABS Take by mouth every morning.    Historical Provider, MD  cycloSPORINE (RESTASIS) 0.05 % ophthalmic emulsion Place 1 drop into both eyes 2 (two) times daily.     Historical Provider, MD  ezetimibe (ZETIA) 10 MG tablet Take 10 mg by mouth every morning.     Historical Provider, MD  famotidine (PEPCID) 20 MG tablet Take 1 tablet (20 mg total) by mouth  2 (two) times daily. 02/28/14   [name], MD  fluconazole (DIFLUCAN) 100 MG tablet Take 1 tablet (100 mg total) by mouth daily. 03/27/14   [name], MD  furosemide (LASIX) 20 MG tablet Take 20 mg by mouth every morning.     Historical Provider, MD  levothyroxine (SYNTHROID, LEVOTHROID) 75 MCG tablet Take 75 mcg by mouth daily before breakfast.    Historical Provider, MD  lisinopril (PRINIVIL,ZESTRIL) 20 MG tablet Take 20 mg by mouth 2 (two) times daily.    Historical Provider, MD  Multiple Vitamin (MULTIVITAMIN WITH MINERALS) TABS tablet Take 1 tablet by mouth daily.    Historical Provider, MD  ondansetron (ZOFRAN) 8 MG  tablet Take 1 tablet (8 mg total) by mouth 2 (two) times daily. For 4 days following chemo 10/31/13   [name], MD  potassium chloride SA (K-DUR,KLOR-CON) 20 MEQ tablet Take 1 tablet (20 mEq total) by mouth daily. 01/23/14   [name], MD  prochlorperazine (COMPAZINE) 10 MG tablet Take 0.5 tablets (5 mg total) by mouth every 6 (six) hours as needed for nausea or vomiting. 10/31/13   [name], MD  rOPINIRole (REQUIP) 1 MG tablet Take 1 mg by mouth at bedtime.    Historical Provider, MD  sertraline (ZOLOFT) 25 MG tablet Take 25 mg by mouth every morning.     Historical Provider, MD  simvastatin (ZOCOR) 20 MG tablet Take 20 mg by mouth every evening.     Historical Provider, MD  Tiotropium Bromide Monohydrate (SPIRIVA RESPIMAT) 2.5 MCG/ACT AERS Inhale 1 puff into the lungs daily.    Historical Provider, MD  traMADol (ULTRAM) 50 MG tablet Take 50 mg by mouth every 6 (six) hours as needed for moderate pain.    Historical Provider, MD  UNKNOWN TO PATIENT MEDICATION LIST GIVEN TO PATIENT FOR CLARIFICATION WITH NURSE AT ASSISTED LIVING FACILITY, WILL FAX BACK TO [place] CORRECT MEDICATION.    Historical Provider, MD  vitamin C (ASCORBIC ACID) 250 MG tablet Take 250 mg by mouth daily.    Historical Provider, MD   BP 140/58 mmHg  Pulse 80  Temp(Src) 97.8 F (36.6 C) (Oral)  Resp 20  SpO2 97% Physical Exam  Constitutional: She is oriented to person, place, and time. She appears well-developed and well-nourished. No distress.  HENT:  Head: Normocephalic.  There is a slight contusion above the right eyebrow. There is minimal swelling.  Eyes: EOM are normal. Pupils are equal, round, and reactive to light.  Neck: Normal range of motion. Neck supple.  There is no cervical spine tenderness to palpation and no step-offs. She has painless range of motion in all directions.  Cardiovascular: Normal rate, regular rhythm and normal heart sounds.   No murmur heard. Pulmonary/Chest: Effort normal and  breath sounds normal. No respiratory distress. She has no wheezes.  Abdominal: Soft. Bowel sounds are normal.  Musculoskeletal:  There is an ecchymosis to the anterior aspect of the right knee. The knee is otherwise stable. There is no effusion.  Lymphadenopathy:    She has no cervical adenopathy.  Neurological: She is alert and oriented to person, place, and time. No cranial nerve deficit. She exhibits normal muscle tone. Coordination normal.  Skin: Skin is warm. She is not diaphoretic.  Nursing note and vitals reviewed.   ED Course  Procedures (including critical care time) Labs Review Labs Reviewed - No data to display  Imaging Review No results found.   EKG Interpretation None  MDM   Final diagnoses:  Fall    Patient presents for evaluation of a fall. She has a contusion above her right eyebrow, however is neurologically intact, has no headache, and no reported loss of consciousness. I do not feel as though imaging is indicated as the patient is not on any blood thinners and her exam is reassuring. She does have some swelling to her right knee. X-rays do not reveal a fracture and this appears to be a contusion. She will be discharged to home with when necessary return.    Veryl Speak, MD 05/16/14 2241

## 2014-05-16 NOTE — ED Notes (Signed)
Patient is alert and oriented x3.  She is complaining of a fall with a hematoma above the right eye. Patient denies any pain.  Patient states that she was putting on her shoe standing up and fell over. She denies any lightheadedness or dizziness before the fall.

## 2014-05-16 NOTE — ED Notes (Signed)
Bed: DH74 Expected date:  Expected time:  Means of arrival:  Comments: 14F Fall hematoma forehead

## 2014-05-29 ENCOUNTER — Ambulatory Visit (HOSPITAL_BASED_OUTPATIENT_CLINIC_OR_DEPARTMENT_OTHER): Payer: Medicare Other

## 2014-05-29 DIAGNOSIS — C8590 Non-Hodgkin lymphoma, unspecified, unspecified site: Secondary | ICD-10-CM

## 2014-05-29 DIAGNOSIS — Z5112 Encounter for antineoplastic immunotherapy: Secondary | ICD-10-CM

## 2014-05-29 DIAGNOSIS — C8290 Follicular lymphoma, unspecified, unspecified site: Secondary | ICD-10-CM

## 2014-05-29 MED ORDER — SODIUM CHLORIDE 0.9 % IV SOLN: 375.0000 mg/m2 | Freq: Once | INTRAVENOUS | Status: DC

## 2014-05-29 MED ORDER — DIPHENHYDRAMINE HCL 25 MG PO CAPS
50.0000 mg | ORAL_CAPSULE | Freq: Once | ORAL | Status: AC
  Administered 2014-05-29: 50 mg via ORAL

## 2014-05-29 MED ORDER — ACETAMINOPHEN 325 MG PO TABS
ORAL_TABLET | ORAL | Status: AC
  Filled 2014-05-29: qty 2

## 2014-05-29 MED ORDER — SODIUM CHLORIDE 0.9 % IV SOLN
Freq: Once | INTRAVENOUS | Status: AC
  Administered 2014-05-29: 10:00:00 via INTRAVENOUS

## 2014-05-29 MED ORDER — SODIUM CHLORIDE 0.9 % IV SOLN
375.0000 mg/m2 | Freq: Once | INTRAVENOUS | Status: AC
  Administered 2014-05-29: 700 mg via INTRAVENOUS
  Filled 2014-05-29: qty 70

## 2014-05-29 MED ORDER — DIPHENHYDRAMINE HCL 25 MG PO CAPS
ORAL_CAPSULE | ORAL | Status: AC
  Filled 2014-05-29: qty 2

## 2014-05-29 MED ORDER — HEPARIN SOD (PORK) LOCK FLUSH 100 UNIT/ML IV SOLN
500.0000 [IU] | Freq: Once | INTRAVENOUS | Status: AC | PRN
  Administered 2014-05-29: 500 [IU]
  Filled 2014-05-29: qty 5

## 2014-05-29 MED ORDER — SODIUM CHLORIDE 0.9 % IJ SOLN
10.0000 mL | INTRAMUSCULAR | Status: DC | PRN
  Administered 2014-05-29: 10 mL
  Filled 2014-05-29: qty 10

## 2014-05-29 MED ORDER — ACETAMINOPHEN 325 MG PO TABS
650.0000 mg | ORAL_TABLET | Freq: Once | ORAL | Status: AC
  Administered 2014-05-29: 650 mg via ORAL

## 2014-05-29 NOTE — Patient Instructions (Signed)

## 2014-06-06 ENCOUNTER — Other Ambulatory Visit (INDEPENDENT_AMBULATORY_CARE_PROVIDER_SITE_OTHER): Payer: Self-pay | Admitting: General Surgery

## 2014-06-06 NOTE — H&P (Signed)
Makayla Fisher 06/06/2014 2:28 PM Location: [facility] Patient #: [id number] DOB: Dec 21, 1922 Married / Language: [name] / Race: White Female History of Present Illness [name] MD; 2/58/5277 3:12 PM) Patient words: reck.  The patient is a 81 year old female who presents with a complaint of colostomy stricture. She is here today to discuss surgery for her colostomy stricture. She underwent a workup last year for surgical correction but her surgery had to be postponed due to a finding of lymphoma. She has underwent chemotherapy for this and is now free of disease. She reports worsening pain and difficulty with bowel movements due to positioning of her ostomy. She also is having trouble keeping the ostomy pouch without leaking. She is unable to leave the bowel's for more than an hour or 2 at a time due to difficulty with her pouching. She is much better from a surgical standpoint. She has recovered well from her illness. She is ready to proceed with surgery. 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 She was found to have lymphoma during workup for surgery. She is now completed chemotherapy for this with good response. She is ready to schedule her colostomy surgery now. We discussed a local revision versus moving the ostomy up higher. I have obtained imaging of the colon. This appears to have length available for mobilizing. I have recommended mobilizing the colon through her current ostomy site as a first option but if we cannot get this up high enough, we will remove the colon up to her left upper quadrant. I have recommended that she stay in the hospital overnight after surgery. Risk include bleeding, infection, difficulty with anesthesia and prolonged hospitalization. All questions were answered. Patient has  agreed to proceed with surgery. Transfusion history High blood pressure Bladder Problems Arthritis Diverticulosis Cancer  Past Surgical History [name], MD; 1/54/0086 3:15 PM) Cataract Surgery Bilateral. Cesarean Section - Multiple Colon Polyp Removal - Colonoscopy Colon Removal - Partial Gallbladder Surgery - Open Hysterectomy (not due to cancer) - Partial  Allergies ([name], CMA; 06/06/2014 2:29 PM) Heparin (Porcine) in D5W *ANTICOAGULANTS* Levaquin *FLUOROQUINOLONES* Sulfa Antibiotics  Medication History ([name], CMA; 06/06/2014 2:29 PM) AmLODIPine Besylate (5MG  Tablet, Oral) Active. Carvedilol (3.125MG  Tablet, Oral) Active. Furosemide (20MG  Tablet, Oral) Active. Zetia (10MG  Tablet, Oral) Active. Restasis (0.05% Emulsion, Ophthalmic) Active. Vitamin D3 (2000UNIT Capsule, Oral) Active. Famotidine (20MG  Tablet, Oral) Active. Levothyroxine Sodium (75MCG Tablet, Oral) Active. Lisinopril (20MG  Tablet, Oral) Active. Multivitamins (Oral) Active. Potassium Chloride (20MEQ Packet, Oral) Active. Requip (1MG  Tablet, Oral) Active. Sertraline HCl (25MG  Tablet, Oral) Active. Simvastatin (20MG  Tablet, Oral) Active. Spiriva HandiHaler (18MCG Capsule, Inhalation) Active. TraMADol HCl (50MG  Tablet, Oral) Active.  Social History [name], MD; 7/61/9509 3:15 PM) Alcohol use Remotely quit alcohol use. No drug use Tobacco use Former smoker.  Family History [name], MD; 07/19/7122 3:15 PM) Heart Disease Mother. Hypertension Mother. Ischemic Bowel Disease Mother. Respiratory Condition Daughter.    Vitals ([name] CMA; 06/06/2014 2:28 PM) 06/06/2014 2:28 PM Weight: 157 lb Height: 61in Body Surface Area: 1.75 m Body Mass Index: 29.66 kg/m Temp.: 98.24F(Temporal)  Pulse: 79 (Regular)  BP: 130/70 (Sitting, Left Arm, Standard)     Physical Exam [name] MD; 5/80/9983 3:13 PM)  General Mental  Status-Alert. General Appearance-Consistent with stated age. Hydration-Well hydrated. Voice-Normal.  Head and Neck Head-normocephalic, atraumatic with no lesions or palpable masses. Trachea-midline.  Thyroid Gland Characteristics - normal size and consistency.  Eye Eyeball - Bilateral-Extraocular movements intact. Sclera/Conjunctiva - Bilateral-No scleral icterus.  Chest and Lung Exam Chest and lung exam reveals -quiet, even and easy respiratory effort with no use of accessory muscles and on auscultation, normal breath sounds, no adventitious sounds and normal vocal resonance. Inspection Chest Wall - Normal. Back - normal.  Cardiovascular Cardiovascular examination reveals -normal heart sounds, regular rate and rhythm with no murmurs and normal pedal pulses bilaterally.  Abdomen Inspection Inspection of the abdomen reveals - No Hernias. Note: colostomy stricture noted, midline scar. Palpation/Percussion Palpation and Percussion of the abdomen reveal - Soft, Non Tender, No Rebound tenderness, No Rigidity (guarding) and No hepatosplenomegaly. Auscultation Auscultation of the abdomen reveals - Bowel sounds normal.  Neurologic Neurologic evaluation reveals -alert and oriented x 3 with no impairment of recent or remote memory. Mental Status-Normal.  Musculoskeletal Normal Exam - Left-Upper Extremity Strength Normal and Lower Extremity Strength Normal. Normal Exam - Right-Upper Extremity Strength Normal and Lower Extremity Strength Normal.    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 She was found to have lymphoma during workup for surgery. She is now completed chemotherapy for this with good response. She is ready to schedule her colostomy surgery now. We discussed a local revision versus moving the ostomy up higher. I  have obtained imaging of the colon. This appears to have length available for mobilizing. I have recommended mobilizing the colon through her current ostomy site as a first option but if we cannot get this up high enough, we will remove the colon up to her left upper quadrant. I have recommended that she stay in the hospital overnight after surgery. Risk include bleeding, infection, difficulty with anesthesia and prolonged hospitalization. All questions were answered. Patient has agreed to proceed with surgery.

## 2014-06-10 NOTE — Patient Instructions (Addendum)
Makayla Fisher  06/10/2014   Your procedure is scheduled on: 06/12/2014    Report to [facility] Main  Entrance and follow signs to               [place] at     0700 AM.  Call this number if you have problems the morning of surgery [phone]   Remember: Please send copy of last medication record with patient day of surgery.  Do not eat food or drink liquids :After Midnight.     Take these medicines the morning of surgery with A SIP OF WATER: Amlodipine, Carvedilol, Restasis eye drop, Pepcid, Synthroid Zoloft,                                You may not have any metal on your body including hair pins and              piercings  Do not wear jewelry, make-up, lotions, powders or perfumes., deodorant.               Do not wear nail polish.  Do not shave  48 hours prior to surgery.     Do not bring valuables to the hospital. [place].  Contacts, dentures or bridgework may not be worn into surgery.  Leave suitcase in the car. After surgery it may be brought to your room.         Special Instructions: coughing and deep breathing exercises,leg exercises               Please read over the following fact sheets you were given: _____________________________________________________________________             [facility] - Preparing for Surgery Before surgery, you can play an important role.  Because skin is not sterile, your skin needs to be as free of germs as possible.  You can reduce the number of germs on your skin by washing with CHG (chlorahexidine gluconate) soap before surgery.  CHG is an antiseptic cleaner which kills germs and bonds with the skin to continue killing germs even after washing. Please DO NOT use if you have an allergy to CHG or antibacterial soaps.  If your skin becomes reddened/irritated stop using the CHG and inform your nurse when you arrive at Short Stay. Do not shave  (including legs and underarms) for at least 48 hours prior to the first CHG shower.  You may shave your face/neck. Please follow these instructions carefully:  1.  Shower with CHG Soap the night before surgery and the  morning of Surgery.  2.  If you choose to wash your hair, wash your hair first as usual with your  normal  shampoo.  3.  After you shampoo, rinse your hair and body thoroughly to remove the  shampoo.                           4.  Use CHG as you would any other liquid soap.  You can apply chg directly  to the skin and wash                       Gently with a  scrungie or clean washcloth.  5.  Apply the CHG Soap to your body ONLY FROM THE NECK DOWN.   Do not use on face/ open                           Wound or open sores. Avoid contact with eyes, ears mouth and genitals (private parts).                       Wash face,  Genitals (private parts) with your normal soap.             6.  Wash thoroughly, paying special attention to the area where your surgery  will be performed.  7.  Thoroughly rinse your body with warm water from the neck down.  8.  DO NOT shower/wash with your normal soap after using and rinsing off  the CHG Soap.                9.  Pat yourself dry with a clean towel.            10.  Wear clean pajamas.            11.  Place clean sheets on your bed the night of your first shower and do not  sleep with pets. Day of Surgery : Do not apply any lotions/deodorants the morning of surgery.  Please wear clean clothes to the hospital/surgery center.  FAILURE TO FOLLOW THESE INSTRUCTIONS MAY RESULT IN THE CANCELLATION OF YOUR SURGERY PATIENT SIGNATURE_________________________________  NURSE SIGNATURE__________________________________  ________________________________________________________________________

## 2014-06-11 ENCOUNTER — Encounter (HOSPITAL_COMMUNITY)
Admission: RE | Admit: 2014-06-11 | Discharge: 2014-06-11 | Disposition: A | Discharge status: Home / Self Care | Payer: Medicare Other | Source: Ambulatory Visit | Attending: General Surgery | Admitting: General Surgery

## 2014-06-11 ENCOUNTER — Other Ambulatory Visit: Payer: Self-pay | Admitting: General Surgery

## 2014-06-11 ENCOUNTER — Ambulatory Visit (HOSPITAL_COMMUNITY)
Admission: RE | Admit: 2014-06-11 | Discharge: 2014-06-11 | Disposition: A | Discharge status: Home / Self Care | Payer: Medicare Other | Source: Ambulatory Visit | Attending: Anesthesiology | Admitting: Anesthesiology

## 2014-06-11 ENCOUNTER — Other Ambulatory Visit (INDEPENDENT_AMBULATORY_CARE_PROVIDER_SITE_OTHER): Payer: Self-pay | Admitting: General Surgery

## 2014-06-11 ENCOUNTER — Encounter (HOSPITAL_COMMUNITY): Payer: Self-pay

## 2014-06-11 DIAGNOSIS — R935 Abnormal findings on diagnostic imaging of other abdominal regions, including retroperitoneum: Secondary | ICD-10-CM

## 2014-06-11 DIAGNOSIS — C8590 Non-Hodgkin lymphoma, unspecified, unspecified site: Secondary | ICD-10-CM | POA: Insufficient documentation

## 2014-06-11 HISTORY — DX: Personal history of other medical treatment: Z92.89

## 2014-06-11 HISTORY — DX: Gastro-esophageal reflux disease without esophagitis: K21.9

## 2014-06-11 LAB — BASIC METABOLIC PANEL
Anion gap: 6 (ref 5–15)
BUN: 14 mg/dL (ref 6–23)
CHLORIDE: 110 mmol/L (ref 96–112)
CO2: 25 mmol/L (ref 19–32)
CREATININE: 0.77 mg/dL (ref 0.50–1.10)
Calcium: 9.5 mg/dL (ref 8.4–10.5)
GFR, EST AFRICAN AMERICAN: 84 mL/min — AB (ref >90)
GFR, EST NON AFRICAN AMERICAN: 72 mL/min — AB (ref >90)
Glucose, Bld: 97 mg/dL (ref 70–99)
POTASSIUM: 4.9 mmol/L (ref 3.5–5.1)
Sodium: 141 mmol/L (ref 135–145)

## 2014-06-11 LAB — CBC
HCT: 34.5 % — ABNORMAL LOW (ref 36.0–46.0)
Hemoglobin: 11.3 g/dL — ABNORMAL LOW (ref 12.0–15.0)
MCH: 32 pg (ref 26.0–34.0)
MCHC: 32.8 g/dL (ref 30.0–36.0)
MCV: 97.7 fL (ref 78.0–100.0)
Platelets: 98 10*3/uL — ABNORMAL LOW (ref 150–400)
RBC: 3.53 MIL/uL — AB (ref 3.87–5.11)
RDW: 15.1 % (ref 11.5–15.5)
WBC: 3.2 10*3/uL — AB (ref 4.0–10.5)

## 2014-06-11 LAB — ABO/RH: ABO/RH(D): A POS

## 2014-06-11 NOTE — Progress Notes (Signed)
Please place orders for 06-12-14 surgery in epic, thanks

## 2014-06-11 NOTE — Progress Notes (Signed)
Instructions received at [facility] per [employer] lpn

## 2014-06-11 NOTE — H&P (Signed)
Makayla Fisher 06/06/2014 2:28 PM Location: [facility] Patient #: [id number] DOB: Aug 30, 1922 Married / Language: [name] / Race: White Female History of Present Illness [name] MD; 5/95/6387 3:12 PM) Patient words: reck.  The patient is a 81 year old female who presents with a complaint of colostomy stricture. She is here today to discuss surgery for her colostomy stricture. She underwent a workup last year for surgical correction but her surgery had to be postponed due to a finding of lymphoma. She has underwent chemotherapy for this and is now free of disease. She reports worsening pain and difficulty with bowel movements due to positioning of her ostomy. She also is having trouble keeping the ostomy pouch without leaking. She is unable to leave the bowel's for more than an hour or 2 at a time due to difficulty with her pouching. She is much better from a surgical standpoint. She has recovered well from her illness. She is ready to proceed with surgery. 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 She was found to have lymphoma during workup for surgery. She is now completed chemotherapy for this with good response. She is ready to schedule her colostomy surgery now. We discussed a local revision versus moving the ostomy up higher. I have obtained imaging of the colon. This appears to have length available for mobilizing. I have recommended mobilizing the colon through her current ostomy site as a first option but if we cannot get this up high enough, we will remove the colon up to her left upper quadrant. I have recommended that she stay in the hospital overnight after surgery. Risk include bleeding, infection, difficulty with anesthesia and prolonged hospitalization. All questions were answered. Patient has  agreed to proceed with surgery. Transfusion history High blood pressure Bladder Problems Arthritis Diverticulosis Cancer  Past Surgical History [name], MD; 1/66/0630 3:15 PM) Cataract Surgery Bilateral. Cesarean Section - Multiple Colon Polyp Removal - Colonoscopy Colon Removal - Partial Gallbladder Surgery - Open Hysterectomy (not due to cancer) - Partial  Allergies ([name], CMA; 06/06/2014 2:29 PM) Heparin (Porcine) in D5W *ANTICOAGULANTS* Levaquin *FLUOROQUINOLONES* Sulfa Antibiotics  Medication History ([name], CMA; 06/06/2014 2:29 PM) AmLODIPine Besylate (5MG  Tablet, Oral) Active. Carvedilol (3.125MG  Tablet, Oral) Active. Furosemide (20MG  Tablet, Oral) Active. Zetia (10MG  Tablet, Oral) Active. Restasis (0.05% Emulsion, Ophthalmic) Active. Vitamin D3 (2000UNIT Capsule, Oral) Active. Famotidine (20MG  Tablet, Oral) Active. Levothyroxine Sodium (75MCG Tablet, Oral) Active. Lisinopril (20MG  Tablet, Oral) Active. Multivitamins (Oral) Active. Potassium Chloride (20MEQ Packet, Oral) Active. Requip (1MG  Tablet, Oral) Active. Sertraline HCl (25MG  Tablet, Oral) Active. Simvastatin (20MG  Tablet, Oral) Active. Spiriva HandiHaler (18MCG Capsule, Inhalation) Active. TraMADol HCl (50MG  Tablet, Oral) Active.  Social History [name], MD; 1/60/1093 3:15 PM) Alcohol use Remotely quit alcohol use. No drug use Tobacco use Former smoker.  Family History [name], MD; 2/35/5732 3:15 PM) Heart Disease Mother. Hypertension Mother. Ischemic Bowel Disease Mother. Respiratory Condition Daughter.    Vitals ([name] CMA; 06/06/2014 2:28 PM) 06/06/2014 2:28 PM Weight: 157 lb Height: 61in Body Surface Area: 1.75 m Body Mass Index: 29.66 kg/m Temp.: 98.35F(Temporal)  Pulse: 79 (Regular)  BP: 130/70 (Sitting, Left Arm, Standard)     Physical Exam [name] MD; 05/27/5425 3:13 PM)  General Mental  Status-Alert. General Appearance-Consistent with stated age. Hydration-Well hydrated. Voice-Normal.  Head and Neck Head-normocephalic, atraumatic with no lesions or palpable masses. Trachea-midline.  Thyroid Gland Characteristics - normal size and consistency.  Eye Eyeball - Bilateral-Extraocular movements intact. Sclera/Conjunctiva - Bilateral-No scleral icterus.  Chest and Lung Exam Chest and lung exam reveals -quiet, even and easy respiratory effort with no use of accessory muscles and on auscultation, normal breath sounds, no adventitious sounds and normal vocal resonance. Inspection Chest Wall - Normal. Back - normal.  Cardiovascular Cardiovascular examination reveals -normal heart sounds, regular rate and rhythm with no murmurs and normal pedal pulses bilaterally.  Abdomen Inspection Inspection of the abdomen reveals - No Hernias. Note: colostomy stricture noted, midline scar. Palpation/Percussion Palpation and Percussion of the abdomen reveal - Soft, Non Tender, No Rebound tenderness, No Rigidity (guarding) and No hepatosplenomegaly. Auscultation Auscultation of the abdomen reveals - Bowel sounds normal.  Neurologic Neurologic evaluation reveals -alert and oriented x 3 with no impairment of recent or remote memory. Mental Status-Normal.  Musculoskeletal Normal Exam - Left-Upper Extremity Strength Normal and Lower Extremity Strength Normal. Normal Exam - Right-Upper Extremity Strength Normal and Lower Extremity Strength Normal.    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 She was found to have lymphoma during workup for surgery. She is now completed chemotherapy for this with good response. She is ready to schedule her colostomy surgery now. We discussed a local revision versus moving the ostomy up higher. I  have obtained imaging of the colon. This appears to have length available for mobilizing. I have recommended mobilizing the colon through her current ostomy site as a first option but if we cannot get this up high enough, we will remove the colon up to her left upper quadrant. I have recommended that she stay in the hospital overnight after surgery. Risk include bleeding, infection, difficulty with anesthesia and prolonged hospitalization. All questions were answered. Patient has agreed to proceed with surgery.

## 2014-06-11 NOTE — Progress Notes (Signed)
Cbc results routed to dr Sharlet Salina by epic

## 2014-06-11 NOTE — Progress Notes (Signed)
Patient instructions faxed to Verneda Skill fax [phone] per patient request ([facility]) LPN

## 2014-06-11 NOTE — Anesthesia Preprocedure Evaluation (Addendum)
Anesthesia Evaluation  Patient identified by MRN, date of birth, ID band Patient awake    Reviewed: Allergy & Precautions, H&P , NPO status , Patient's Chart, lab work & pertinent test results  Airway Mallampati: II  TM Distance: >3 FB Neck ROM: full    Dental  (+) Edentulous Upper, Edentulous Lower, Dental Advisory Given   Pulmonary neg pulmonary ROS, former smoker,  breath sounds clear to auscultation  Pulmonary exam normal       Cardiovascular + Past MI and + CABG Rhythm:regular Rate:Normal  MI 2009. Cleared by cardiology for surgery   Neuro/Psych negative neurological ROS  negative psych ROS   GI/Hepatic negative GI ROS, Neg liver ROS,   Endo/Other  negative endocrine ROS  Renal/GU negative Renal ROS  negative genitourinary   Musculoskeletal   Abdominal   Peds  Hematology Non-Hodgkin's lymphoma/platelets 98K   Anesthesia Other Findings   Reproductive/Obstetrics negative OB ROS                           Anesthesia Physical Anesthesia Plan  ASA: III  Anesthesia Plan: General   Post-op Pain Management:    Induction: Intravenous  Airway Management Planned: Oral ETT  Additional Equipment:   Intra-op Plan:   Post-operative Plan: Extubation in OR  Informed Consent: I have reviewed the patients History and Physical, chart, labs and discussed the procedure including the risks, benefits and alternatives for the proposed anesthesia with the patient or authorized representative who has indicated his/her understanding and acceptance.   Dental Advisory Given  Plan Discussed with: CRNA and Surgeon  Anesthesia Plan Comments:         Anesthesia Quick Evaluation

## 2014-06-11 NOTE — Consult Note (Signed)
[place] ostomy consult note Patient seen per MD request for assessment of abdomen and for stoma site selection if relocation of colostomy is possible.  Patient is examined in the sitting and standing positions.  Her abdomen is mildly obese and flaccid.  The existing stoma is flush with the abdomen, with stricture and in a deep crease; there is parastomal skin denudation extending 3cm from the stoma in a circumferential presentaiton. The patient and her niece state this is actually much better than in recent days.  Two sites are selected for ostomy placement should relocation of the stoma be possible tomorrow.  It should be noted that there is a deep crease located superior to these markings which would be problematic for pouching. The first site selected is 1cm below the umbilicus and 3.3XO to the left.  The second choice is 1cm below the umbilicus and 3.2NV to the left. Both sites are marked with a surgical site marking pen and are covered with a thin film transparent dressing. Patient presented today with her niece for the preoperative appointment and a lengthy visit was allowed to hear of patient's experience with her ostomy to-date.  The patient reports not eating so that her stoma does not function and complete incontinence where she has had to leave her pouching system off and collect stool into a washcloth.  She has good insurance [employer] and a supplemental policy), but that her usage supercedes the current allowables and she is paying quite a bit of money out-of-pocket for ostomy supplies each month.  Education provided: Patient is provided information about support that exists for her following revision or creation of another stoma.  She is informed about colostomy irrigation and of differing materials that are available for pouching.  She is aware that there will be ostomy nurse support in the days following her surgery. [place] nursing team will follow, and will remain available to this patient, the  nursing and medical team.  Please re-consult if a new stoma is created intraoperatively. Thanks, Maudie Flakes, MSN, RN, [place], [place], [place] [phone])

## 2014-06-12 ENCOUNTER — Inpatient Hospital Stay (HOSPITAL_COMMUNITY)
Admission: RE | Admit: 2014-06-12 | Discharge: 2014-06-16 | DRG: 330 | Disposition: A | Discharge status: Home Health | Payer: Medicare Other | Source: Ambulatory Visit | Attending: General Surgery | Admitting: General Surgery

## 2014-06-12 ENCOUNTER — Inpatient Hospital Stay (HOSPITAL_COMMUNITY): Payer: Medicare Other | Admitting: Anesthesiology

## 2014-06-12 ENCOUNTER — Encounter (HOSPITAL_COMMUNITY)
Admission: RE | Disposition: A | Discharge status: Nursing Home (Includes ICF) | Payer: Self-pay | Source: Ambulatory Visit | Attending: General Surgery

## 2014-06-12 ENCOUNTER — Encounter (HOSPITAL_COMMUNITY): Payer: Self-pay | Admitting: *Deleted

## 2014-06-12 DIAGNOSIS — Z79899 Other long term (current) drug therapy: Secondary | ICD-10-CM

## 2014-06-12 DIAGNOSIS — Z888 Allergy status to other drugs, medicaments and biological substances status: Secondary | ICD-10-CM

## 2014-06-12 DIAGNOSIS — Z881 Allergy status to other antibiotic agents status: Secondary | ICD-10-CM

## 2014-06-12 DIAGNOSIS — K5669 Other intestinal obstruction: Secondary | ICD-10-CM | POA: Diagnosis present

## 2014-06-12 DIAGNOSIS — E079 Disorder of thyroid, unspecified: Secondary | ICD-10-CM | POA: Diagnosis present

## 2014-06-12 DIAGNOSIS — Z79891 Long term (current) use of opiate analgesic: Secondary | ICD-10-CM

## 2014-06-12 DIAGNOSIS — Z86010 Personal history of colonic polyps: Secondary | ICD-10-CM

## 2014-06-12 DIAGNOSIS — I252 Old myocardial infarction: Secondary | ICD-10-CM

## 2014-06-12 DIAGNOSIS — K56699 Other intestinal obstruction unspecified as to partial versus complete obstruction: Secondary | ICD-10-CM | POA: Diagnosis present

## 2014-06-12 DIAGNOSIS — Z8249 Family history of ischemic heart disease and other diseases of the circulatory system: Secondary | ICD-10-CM

## 2014-06-12 DIAGNOSIS — C8590 Non-Hodgkin lymphoma, unspecified, unspecified site: Secondary | ICD-10-CM | POA: Diagnosis present

## 2014-06-12 DIAGNOSIS — Z01812 Encounter for preprocedural laboratory examination: Secondary | ICD-10-CM

## 2014-06-12 DIAGNOSIS — M1990 Unspecified osteoarthritis, unspecified site: Secondary | ICD-10-CM | POA: Diagnosis present

## 2014-06-12 DIAGNOSIS — I10 Essential (primary) hypertension: Secondary | ICD-10-CM | POA: Diagnosis present

## 2014-06-12 DIAGNOSIS — K9403 Colostomy malfunction: Principal | ICD-10-CM | POA: Diagnosis present

## 2014-06-12 DIAGNOSIS — Y833 Surgical operation with formation of external stoma as the cause of abnormal reaction of the patient, or of later complication, without mention of misadventure at the time of the procedure: Secondary | ICD-10-CM | POA: Diagnosis present

## 2014-06-12 DIAGNOSIS — R55 Syncope and collapse: Secondary | ICD-10-CM | POA: Diagnosis not present

## 2014-06-12 HISTORY — PX: COLOSTOMY REVISION: SHX5232

## 2014-06-12 LAB — TYPE AND SCREEN
ABO/RH(D): A POS
Antibody Screen: NEGATIVE

## 2014-06-12 SURGERY — REVISION, COLOSTOMY
Anesthesia: General

## 2014-06-12 MED ORDER — SUCCINYLCHOLINE CHLORIDE 20 MG/ML IJ SOLN
INTRAMUSCULAR | Status: DC | PRN
  Administered 2014-06-12: 100 mg via INTRAVENOUS

## 2014-06-12 MED ORDER — DEXAMETHASONE SODIUM PHOSPHATE 10 MG/ML IJ SOLN
INTRAMUSCULAR | Status: DC | PRN
  Administered 2014-06-12: 10 mg via INTRAVENOUS

## 2014-06-12 MED ORDER — PROPOFOL 10 MG/ML IV BOLUS
INTRAVENOUS | Status: DC | PRN
  Administered 2014-06-12: 150 mg via INTRAVENOUS

## 2014-06-12 MED ORDER — ONDANSETRON HCL 4 MG/2ML IJ SOLN
INTRAMUSCULAR | Status: DC | PRN
  Administered 2014-06-12: 4 mg via INTRAVENOUS

## 2014-06-12 MED ORDER — AMLODIPINE BESYLATE 5 MG PO TABS
5.0000 mg | ORAL_TABLET | Freq: Every morning | ORAL | Status: DC
  Administered 2014-06-14 – 2014-06-16 (×3): 5 mg via ORAL
  Filled 2014-06-12 (×4): qty 1

## 2014-06-12 MED ORDER — EZETIMIBE 10 MG PO TABS
10.0000 mg | ORAL_TABLET | Freq: Every morning | ORAL | Status: DC
  Administered 2014-06-12 – 2014-06-16 (×5): 10 mg via ORAL
  Filled 2014-06-12 (×6): qty 1

## 2014-06-12 MED ORDER — GLYCOPYRROLATE 0.2 MG/ML IJ SOLN
INTRAMUSCULAR | Status: DC | PRN
  Administered 2014-06-12: 0.4 mg via INTRAVENOUS

## 2014-06-12 MED ORDER — ONDANSETRON HCL 4 MG PO TABS: 4.0000 mg | ORAL_TABLET | Freq: Four times a day (QID) | ORAL | Status: DC | PRN

## 2014-06-12 MED ORDER — ONDANSETRON HCL 4 MG/2ML IJ SOLN: 4.0000 mg | Freq: Four times a day (QID) | INTRAMUSCULAR | Status: DC | PRN

## 2014-06-12 MED ORDER — SODIUM CHLORIDE 0.9 % IV SOLN
INTRAVENOUS | Status: DC
  Administered 2014-06-12: 15:00:00 via INTRAVENOUS
  Administered 2014-06-13: 75 mL/h via INTRAVENOUS

## 2014-06-12 MED ORDER — CEFOXITIN SODIUM 2 G IV SOLR
INTRAVENOUS | Status: AC
  Filled 2014-06-12: qty 2

## 2014-06-12 MED ORDER — ROCURONIUM BROMIDE 100 MG/10ML IV SOLN
INTRAVENOUS | Status: DC | PRN
  Administered 2014-06-12: 20 mg via INTRAVENOUS

## 2014-06-12 MED ORDER — HYDROMORPHONE HCL 1 MG/ML IJ SOLN
0.2500 mg | INTRAMUSCULAR | Status: DC | PRN
  Administered 2014-06-12 (×2): 0.25 mg via INTRAVENOUS
  Administered 2014-06-12: 0.5 mg via INTRAVENOUS

## 2014-06-12 MED ORDER — GLYCOPYRROLATE 0.2 MG/ML IJ SOLN
INTRAMUSCULAR | Status: AC
  Filled 2014-06-12: qty 2

## 2014-06-12 MED ORDER — IBUPROFEN 200 MG PO TABS: 600.0000 mg | ORAL_TABLET | Freq: Four times a day (QID) | ORAL | Status: DC | PRN

## 2014-06-12 MED ORDER — NEOSTIGMINE METHYLSULFATE 10 MG/10ML IV SOLN
INTRAVENOUS | Status: DC | PRN
  Administered 2014-06-12: 3 mg via INTRAVENOUS

## 2014-06-12 MED ORDER — LIDOCAINE HCL (CARDIAC) 20 MG/ML IV SOLN
INTRAVENOUS | Status: DC | PRN
  Administered 2014-06-12: 100 mg via INTRAVENOUS

## 2014-06-12 MED ORDER — SERTRALINE HCL 25 MG PO TABS
25.0000 mg | ORAL_TABLET | Freq: Every morning | ORAL | Status: DC
  Administered 2014-06-13 – 2014-06-16 (×4): 25 mg via ORAL
  Filled 2014-06-12 (×4): qty 1

## 2014-06-12 MED ORDER — LACTATED RINGERS IV SOLN
INTRAVENOUS | Status: DC | PRN
  Administered 2014-06-12 (×2): via INTRAVENOUS

## 2014-06-12 MED ORDER — EPHEDRINE SULFATE 50 MG/ML IJ SOLN
INTRAMUSCULAR | Status: DC | PRN
  Administered 2014-06-12 (×2): 10 mg via INTRAVENOUS

## 2014-06-12 MED ORDER — SIMVASTATIN 10 MG PO TABS
10.0000 mg | ORAL_TABLET | Freq: Every evening | ORAL | Status: DC
  Administered 2014-06-12 – 2014-06-15 (×4): 10 mg via ORAL
  Filled 2014-06-12 (×5): qty 1

## 2014-06-12 MED ORDER — FAMOTIDINE 20 MG PO TABS
20.0000 mg | ORAL_TABLET | Freq: Two times a day (BID) | ORAL | Status: DC
  Administered 2014-06-12 – 2014-06-16 (×8): 20 mg via ORAL
  Filled 2014-06-12 (×9): qty 1

## 2014-06-12 MED ORDER — LORAZEPAM 0.5 MG PO TABS
0.5000 mg | ORAL_TABLET | Freq: Every day | ORAL | Status: DC | PRN
  Administered 2014-06-14 – 2014-06-15 (×2): 0.5 mg via ORAL
  Filled 2014-06-12 (×2): qty 1

## 2014-06-12 MED ORDER — 0.9 % SODIUM CHLORIDE (POUR BTL) OPTIME
TOPICAL | Status: DC | PRN
  Administered 2014-06-12: 1000 mL

## 2014-06-12 MED ORDER — LISINOPRIL 20 MG PO TABS
20.0000 mg | ORAL_TABLET | Freq: Two times a day (BID) | ORAL | Status: DC
  Administered 2014-06-12 – 2014-06-16 (×8): 20 mg via ORAL
  Filled 2014-06-12 (×10): qty 1

## 2014-06-12 MED ORDER — FUROSEMIDE 20 MG PO TABS
20.0000 mg | ORAL_TABLET | Freq: Every day | ORAL | Status: DC
  Administered 2014-06-12 – 2014-06-16 (×5): 20 mg via ORAL
  Filled 2014-06-12 (×5): qty 1

## 2014-06-12 MED ORDER — HYDROMORPHONE HCL 1 MG/ML IJ SOLN
INTRAMUSCULAR | Status: AC
  Filled 2014-06-12: qty 1

## 2014-06-12 MED ORDER — PROMETHAZINE HCL 25 MG/ML IJ SOLN
6.2500 mg | INTRAMUSCULAR | Status: DC | PRN
  Administered 2014-06-12: 6.25 mg via INTRAVENOUS

## 2014-06-12 MED ORDER — LACTATED RINGERS IV SOLN: INTRAVENOUS | Status: DC

## 2014-06-12 MED ORDER — CARVEDILOL 3.125 MG PO TABS
3.1250 mg | ORAL_TABLET | Freq: Every day | ORAL | Status: DC
  Administered 2014-06-13 – 2014-06-16 (×4): 3.125 mg via ORAL
  Filled 2014-06-12 (×5): qty 1

## 2014-06-12 MED ORDER — DEXTROSE 5 % IV SOLN
2.0000 g | INTRAVENOUS | Status: AC
  Administered 2014-06-12: 2 g via INTRAVENOUS

## 2014-06-12 MED ORDER — PROPOFOL 10 MG/ML IV BOLUS
INTRAVENOUS | Status: AC
  Filled 2014-06-12: qty 20

## 2014-06-12 MED ORDER — PROMETHAZINE HCL 25 MG/ML IJ SOLN
INTRAMUSCULAR | Status: AC
Start: 2014-06-12 — End: 2014-06-12
  Filled 2014-06-12: qty 1

## 2014-06-12 MED ORDER — ROPINIROLE HCL 1 MG PO TABS
1.0000 mg | ORAL_TABLET | Freq: Every day | ORAL | Status: DC
  Administered 2014-06-12 – 2014-06-15 (×4): 1 mg via ORAL
  Filled 2014-06-12 (×5): qty 1

## 2014-06-12 MED ORDER — CYCLOSPORINE 0.05 % OP EMUL
1.0000 [drp] | Freq: Two times a day (BID) | OPHTHALMIC | Status: DC
  Administered 2014-06-12 – 2014-06-16 (×8): 1 [drp] via OPHTHALMIC
  Filled 2014-06-12 (×10): qty 1

## 2014-06-12 MED ORDER — POLYETHYLENE GLYCOL 3350 17 G PO PACK: 17.0000 g | PACK | Freq: Every day | ORAL | Status: DC | PRN

## 2014-06-12 MED ORDER — TIOTROPIUM BROMIDE MONOHYDRATE 2.5 MCG/ACT IN AERS: 1.0000 | INHALATION_SPRAY | Freq: Every day | RESPIRATORY_TRACT | Status: DC

## 2014-06-12 MED ORDER — ROCURONIUM BROMIDE 100 MG/10ML IV SOLN
INTRAVENOUS | Status: AC
  Filled 2014-06-12: qty 1

## 2014-06-12 MED ORDER — TIOTROPIUM BROMIDE MONOHYDRATE 18 MCG IN CAPS
18.0000 ug | ORAL_CAPSULE | Freq: Every day | RESPIRATORY_TRACT | Status: DC
  Administered 2014-06-12 – 2014-06-16 (×5): 18 ug via RESPIRATORY_TRACT
  Filled 2014-06-12: qty 5

## 2014-06-12 MED ORDER — MORPHINE SULFATE 2 MG/ML IJ SOLN
2.0000 mg | INTRAMUSCULAR | Status: DC | PRN
  Administered 2014-06-13: 2 mg via INTRAVENOUS
  Filled 2014-06-12 (×2): qty 1

## 2014-06-12 MED ORDER — HYDROCODONE-ACETAMINOPHEN 5-325 MG PO TABS
1.0000 | ORAL_TABLET | ORAL | Status: DC | PRN
  Administered 2014-06-13: 1 via ORAL
  Filled 2014-06-12: qty 1

## 2014-06-12 MED ORDER — FENTANYL CITRATE 0.05 MG/ML IJ SOLN
INTRAMUSCULAR | Status: AC
  Filled 2014-06-12: qty 5

## 2014-06-12 MED ORDER — LIDOCAINE HCL (CARDIAC) 20 MG/ML IV SOLN
INTRAVENOUS | Status: AC
  Filled 2014-06-12: qty 5

## 2014-06-12 MED ORDER — FENTANYL CITRATE 0.05 MG/ML IJ SOLN
INTRAMUSCULAR | Status: DC | PRN
  Administered 2014-06-12: 25 ug via INTRAVENOUS
  Administered 2014-06-12 (×2): 50 ug via INTRAVENOUS

## 2014-06-12 SURGICAL SUPPLY — 35 items
BLADE EXTENDED COATED 6.5IN (ELECTRODE) ×3 IMPLANT
BLADE HEX COATED 2.75 (ELECTRODE) IMPLANT
CHLORAPREP W/TINT 26ML (MISCELLANEOUS) ×3 IMPLANT
COVER MAYO STAND STRL (DRAPES) IMPLANT
DRAPE LAPAROSCOPIC ABDOMINAL (DRAPES) ×3 IMPLANT
DRAPE SHEET LG 3/4 BI-LAMINATE (DRAPES) IMPLANT
DRAPE WARM FLUID 44X44 (DRAPE) ×3 IMPLANT
ELECT REM PT RETURN 9FT ADLT (ELECTROSURGICAL) ×3
ELECTRODE REM PT RTRN 9FT ADLT (ELECTROSURGICAL) ×1 IMPLANT
GAUZE SPONGE 4X4 12PLY STRL (GAUZE/BANDAGES/DRESSINGS) ×3 IMPLANT
GLOVE BIOGEL PI IND STRL 7.0 (GLOVE) ×2 IMPLANT
GLOVE BIOGEL PI INDICATOR 7.0 (GLOVE) ×4
KIT BASIN OR (CUSTOM PROCEDURE TRAY) ×3 IMPLANT
LEGGING LITHOTOMY PAIR STRL (DRAPES) IMPLANT
LIGASURE IMPACT 36 18CM CVD LR (INSTRUMENTS) IMPLANT
NS IRRIG 1000ML POUR BTL (IV SOLUTION) ×6 IMPLANT
PACK GENERAL/GYN (CUSTOM PROCEDURE TRAY) ×3 IMPLANT
POUCH DRAINABLE 1PC 2 1/4 FLAT (OSTOMY) ×3 IMPLANT
SPONGE LAP 18X18 X RAY DECT (DISPOSABLE) ×3 IMPLANT
STAPLER VISISTAT 35W (STAPLE) IMPLANT
SUCTION POOLE TIP (SUCTIONS) ×3 IMPLANT
SUT PDS AB 1 CTX 36 (SUTURE) IMPLANT
SUT PDS AB 1 TP1 96 (SUTURE) IMPLANT
SUT PROLENE 2 0 BLUE (SUTURE) IMPLANT
SUT SILK 2 0 (SUTURE)
SUT SILK 2 0 SH CR/8 (SUTURE) IMPLANT
SUT SILK 2 0SH CR/8 30 (SUTURE) ×3 IMPLANT
SUT SILK 2-0 18XBRD TIE 12 (SUTURE) IMPLANT
SUT SILK 2-0 30XBRD TIE 12 (SUTURE) IMPLANT
SUT SILK 3 0 (SUTURE)
SUT SILK 3 0 SH CR/8 (SUTURE) ×3 IMPLANT
SUT SILK 3-0 18XBRD TIE 12 (SUTURE) IMPLANT
SUT VIC AB 3-0 SH 18 (SUTURE) ×3 IMPLANT
TOWEL OR 17X26 10 PK STRL BLUE (TOWEL DISPOSABLE) ×3 IMPLANT
TRAY FOLEY CATH 14FRSI W/METER (CATHETERS) ×3 IMPLANT

## 2014-06-12 NOTE — Op Note (Signed)
06/12/2014  9:39 AM  PATIENT:  Makayla Fisher  81 y.o. female  Patient Care Team: [name], DO as PCP - General (Family Medicine)  PRE-OPERATIVE DIAGNOSIS:  COLOSTOMY STRICTURE  POST-OPERATIVE DIAGNOSIS:  COLOSTOMY STRICTURE  PROCEDURE:  COLOSTOMY REVISION   Surgeon(s): [name], MD  ASSISTANT: none   ANESTHESIA:   general  EBL:  Total I/O In: 1000 [I.V.:1000] Out: -   DRAINS: none   SPECIMEN:  No Specimen  DISPOSITION OF SPECIMEN:  N/A  COUNTS:  YES  PLAN OF CARE: Admit for overnight observation  PATIENT DISPOSITION:  PACU - hemodynamically stable.  INDICATION: This is a 81 year old female who underwent emergent surgery for a colon perforation almost 2 years ago. She has now developed a colostomy stricture through her end sigmoid colostomy.   OR FINDINGS: Distal sigmoid mobilized without difficulty  DESCRIPTION: the patient was identified in the preoperative holding area and taken to the OR where they were laid supine on the operating room table.  General anesthesia was induced without difficulty. SCDs were also noted to be in place prior to the initiation of anesthesia.  The patient was then prepped and draped in the usual sterile fashion.   A surgical timeout was performed indicating the correct patient, procedure, positioning and need for preoperative antibiotics.   I made an incision around the ostomy stricture using Bovie electrocautery. Dissection was carried down through the subcutaneous tissues and around the colostomy also using Bovie cautery. I incised down to the fascia and divided the adherent tissues connecting the colostomy to the fascia. After the colostomy was completely mobilized I was able to free an extra 10-15 cm from the level of the fascia. I trimmed off the stenotic portion of the colostomy using electrocautery. I sutured the colostomy to the fascia with 2 interrupted 3-0 silk sutures. I then performed a new brooked ostomy using  interrupted 3-0 Vicryl sutures. An ostomy appliance was applied. The patient was awakened from anesthesia and sent to the postanesthesia care unit in stable condition. All counts were correct per operating room staff.

## 2014-06-12 NOTE — Interval H&P Note (Signed)
History and Physical Interval Note:  06/12/2014 8:06 AM  Makayla Fisher  has presented today for surgery, with the diagnosis of COLOSTOMY STRICTURE  The various methods of treatment have been discussed with the patient and family. After consideration of risks, benefits and other options for treatment, the patient has consented to  Procedure(s): COLOSTOMY REVISION (N/A) as a surgical intervention .  The patient's history has been reviewed, patient examined, no change in status, stable for surgery.  I have reviewed the patient's chart and labs.  Questions were answered to the patient's satisfaction.    Rosario Adie, MD  Colorectal and [facility]

## 2014-06-12 NOTE — H&P (View-Only) (Signed)
Makayla Fisher 06/06/2014 2:28 PM Location: [facility] Patient #: [id number] DOB: [birth year] Married / Language: Makayla Fisher / Race: White Female History of Present Illness [name] MD; 08/26/5463 3:12 PM) Patient words: reck.  The patient is a 81 year old female who presents with a complaint of colostomy stricture. She is here today to discuss surgery for her colostomy stricture. She underwent a workup last year for surgical correction but her surgery had to be postponed due to a finding of lymphoma. She has underwent chemotherapy for this and is now free of disease. She reports worsening pain and difficulty with bowel movements due to positioning of her ostomy. She also is having trouble keeping the ostomy pouch without leaking. She is unable to leave the bowel's for more than an hour or 2 at a time due to difficulty with her pouching. She is much better from a surgical standpoint. She has recovered well from her illness. She is ready to proceed with surgery. 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 She was found to have lymphoma during workup for surgery. She is now completed chemotherapy for this with good response. She is ready to schedule her colostomy surgery now. We discussed a local revision versus moving the ostomy up higher. I have obtained imaging of the colon. This appears to have length available for mobilizing. I have recommended mobilizing the colon through her current ostomy site as a first option but if we cannot get this up high enough, we will remove the colon up to her left upper quadrant. I have recommended that she stay in the hospital overnight after surgery. Risk include bleeding, infection, difficulty with anesthesia and prolonged hospitalization. All questions were answered. Patient has  agreed to proceed with surgery. Transfusion history High blood pressure Bladder Problems Arthritis Diverticulosis Cancer  Past Surgical History [name], MD; 4/94/4967 3:15 PM) Cataract Surgery Bilateral. Cesarean Section - Multiple Colon Polyp Removal - Colonoscopy Colon Removal - Partial Gallbladder Surgery - Open Hysterectomy (not due to cancer) - Partial  Allergies ([name], CMA; 06/06/2014 2:29 PM) Heparin (Porcine) in D5W *ANTICOAGULANTS* Levaquin *FLUOROQUINOLONES* Sulfa Antibiotics  Medication History ([name], CMA; 06/06/2014 2:29 PM) AmLODIPine Besylate (5MG  Tablet, Oral) Active. Carvedilol (3.125MG  Tablet, Oral) Active. Furosemide (20MG  Tablet, Oral) Active. Zetia (10MG  Tablet, Oral) Active. Restasis (0.05% Emulsion, Ophthalmic) Active. Vitamin D3 (2000UNIT Capsule, Oral) Active. Famotidine (20MG  Tablet, Oral) Active. Levothyroxine Sodium (75MCG Tablet, Oral) Active. Lisinopril (20MG  Tablet, Oral) Active. Multivitamins (Oral) Active. Potassium Chloride (20MEQ Packet, Oral) Active. Requip (1MG  Tablet, Oral) Active. Sertraline HCl (25MG  Tablet, Oral) Active. Simvastatin (20MG  Tablet, Oral) Active. Spiriva HandiHaler (18MCG Capsule, Inhalation) Active. TraMADol HCl (50MG  Tablet, Oral) Active.  Social History [name], MD; 5/91/6384 3:15 PM) Alcohol use Remotely quit alcohol use. No drug use Tobacco use Former smoker.  Family History [name], MD; 6/65/9935 3:15 PM) Heart Disease Mother. Hypertension Mother. Ischemic Bowel Disease Mother. Respiratory Condition Daughter.    Vitals ([name] CMA; 06/06/2014 2:28 PM) 06/06/2014 2:28 PM Weight: 157 lb Height: 61in Body Surface Area: 1.75 m Body Mass Index: 29.66 kg/m Temp.: 98.27F(Temporal)  Pulse: 79 (Regular)  BP: 130/70 (Sitting, Left Arm, Standard)     Physical Exam [name] MD; 10/24/7791 3:13 PM)  General Mental  Status-Alert. General Appearance-Consistent with stated age. Hydration-Well hydrated. Voice-Normal.  Head and Neck Head-normocephalic, atraumatic with no lesions or palpable masses. Trachea-midline.  Thyroid Gland Characteristics - normal size and consistency.  Eye Eyeball - Bilateral-Extraocular movements intact. Sclera/Conjunctiva - Bilateral-No scleral icterus.  Chest and Lung Exam Chest and lung exam reveals -quiet, even and easy respiratory effort with no use of accessory muscles and on auscultation, normal breath sounds, no adventitious sounds and normal vocal resonance. Inspection Chest Wall - Normal. Back - normal.  Cardiovascular Cardiovascular examination reveals -normal heart sounds, regular rate and rhythm with no murmurs and normal pedal pulses bilaterally.  Abdomen Inspection Inspection of the abdomen reveals - No Hernias. Note: colostomy stricture noted, midline scar. Palpation/Percussion Palpation and Percussion of the abdomen reveal - Soft, Non Tender, No Rebound tenderness, No Rigidity (guarding) and No hepatosplenomegaly. Auscultation Auscultation of the abdomen reveals - Bowel sounds normal.  Neurologic Neurologic evaluation reveals -alert and oriented x 3 with no impairment of recent or remote memory. Mental Status-Normal.  Musculoskeletal Normal Exam - Left-Upper Extremity Strength Normal and Lower Extremity Strength Normal. Normal Exam - Right-Upper Extremity Strength Normal and Lower Extremity Strength Normal.    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 She was found to have lymphoma during workup for surgery. She is now completed chemotherapy for this with good response. She is ready to schedule her colostomy surgery now. We discussed a local revision versus moving the ostomy up higher. I  have obtained imaging of the colon. This appears to have length available for mobilizing. I have recommended mobilizing the colon through her current ostomy site as a first option but if we cannot get this up high enough, we will remove the colon up to her left upper quadrant. I have recommended that she stay in the hospital overnight after surgery. Risk include bleeding, infection, difficulty with anesthesia and prolonged hospitalization. All questions were answered. Patient has agreed to proceed with surgery.

## 2014-06-12 NOTE — Anesthesia Postprocedure Evaluation (Signed)
  Anesthesia Post-op Note  Patient: Makayla Fisher  Procedure(s) Performed: Procedure(s) (LRB): COLOSTOMY REVISION (N/A)  Patient Location: PACU  Anesthesia Type: General  Level of Consciousness: awake and alert   Airway and Oxygen Therapy: Patient Spontanous Breathing  Post-op Pain: mild  Post-op Assessment: Post-op Vital signs reviewed, Patient's Cardiovascular Status Stable, Respiratory Function Stable, Patent Airway and No signs of Nausea or vomiting  Last Vitals:  Filed Vitals:   06/12/14 1328  BP: 143/63  Pulse: 58  Temp: 36.8 C  Resp: 16    Post-op Vital Signs: stable   Complications: No apparent anesthesia complications

## 2014-06-12 NOTE — Transfer of Care (Signed)
Immediate Anesthesia Transfer of Care Note  Patient: Makayla Fisher  Procedure(s) Performed: Procedure(s): COLOSTOMY REVISION (N/A)  Patient Location: PACU  Anesthesia Type:General  Level of Consciousness: sedated  Airway & Oxygen Therapy: Patient Spontanous Breathing and Patient connected to face mask oxygen  Post-op Assessment: Report given to RN and Post -op Vital signs reviewed and stable  Post vital signs: Reviewed and stable  Last Vitals:  Filed Vitals:   06/12/14 0706  BP: 138/51  Pulse: 71  Temp: 36.6 C  Resp: 18    Complications: No apparent anesthesia complications

## 2014-06-13 ENCOUNTER — Encounter (HOSPITAL_COMMUNITY): Payer: Self-pay | Admitting: General Surgery

## 2014-06-13 DIAGNOSIS — E079 Disorder of thyroid, unspecified: Secondary | ICD-10-CM | POA: Diagnosis present

## 2014-06-13 DIAGNOSIS — K5669 Other intestinal obstruction: Secondary | ICD-10-CM | POA: Diagnosis present

## 2014-06-13 DIAGNOSIS — K56699 Other intestinal obstruction unspecified as to partial versus complete obstruction: Secondary | ICD-10-CM | POA: Diagnosis present

## 2014-06-13 DIAGNOSIS — Z79891 Long term (current) use of opiate analgesic: Secondary | ICD-10-CM | POA: Diagnosis not present

## 2014-06-13 DIAGNOSIS — K9403 Colostomy malfunction: Secondary | ICD-10-CM | POA: Diagnosis present

## 2014-06-13 DIAGNOSIS — Z86010 Personal history of colonic polyps: Secondary | ICD-10-CM | POA: Diagnosis not present

## 2014-06-13 DIAGNOSIS — Y833 Surgical operation with formation of external stoma as the cause of abnormal reaction of the patient, or of later complication, without mention of misadventure at the time of the procedure: Secondary | ICD-10-CM | POA: Diagnosis present

## 2014-06-13 DIAGNOSIS — I252 Old myocardial infarction: Secondary | ICD-10-CM | POA: Diagnosis not present

## 2014-06-13 DIAGNOSIS — M1990 Unspecified osteoarthritis, unspecified site: Secondary | ICD-10-CM | POA: Diagnosis present

## 2014-06-13 DIAGNOSIS — Z881 Allergy status to other antibiotic agents status: Secondary | ICD-10-CM | POA: Diagnosis not present

## 2014-06-13 DIAGNOSIS — Z79899 Other long term (current) drug therapy: Secondary | ICD-10-CM | POA: Diagnosis not present

## 2014-06-13 DIAGNOSIS — R55 Syncope and collapse: Secondary | ICD-10-CM | POA: Diagnosis not present

## 2014-06-13 DIAGNOSIS — I10 Essential (primary) hypertension: Secondary | ICD-10-CM | POA: Diagnosis present

## 2014-06-13 DIAGNOSIS — C8590 Non-Hodgkin lymphoma, unspecified, unspecified site: Secondary | ICD-10-CM | POA: Diagnosis present

## 2014-06-13 DIAGNOSIS — Z8249 Family history of ischemic heart disease and other diseases of the circulatory system: Secondary | ICD-10-CM | POA: Diagnosis not present

## 2014-06-13 DIAGNOSIS — Z01812 Encounter for preprocedural laboratory examination: Secondary | ICD-10-CM | POA: Diagnosis not present

## 2014-06-13 DIAGNOSIS — Z888 Allergy status to other drugs, medicaments and biological substances status: Secondary | ICD-10-CM | POA: Diagnosis not present

## 2014-06-13 MED ORDER — ACETAMINOPHEN 325 MG PO TABS
650.0000 mg | ORAL_TABLET | Freq: Four times a day (QID) | ORAL | Status: DC | PRN
  Administered 2014-06-13 – 2014-06-15 (×3): 650 mg via ORAL
  Filled 2014-06-13 (×3): qty 2

## 2014-06-13 MED ORDER — TRAMADOL HCL 50 MG PO TABS
50.0000 mg | ORAL_TABLET | Freq: Four times a day (QID) | ORAL | Status: DC | PRN
  Administered 2014-06-13 (×2): 50 mg via ORAL
  Filled 2014-06-13 (×2): qty 1

## 2014-06-13 NOTE — Progress Notes (Addendum)
Clinical Social Work Department BRIEF PSYCHOSOCIAL ASSESSMENT 06/13/2014  Patient:  Makayla Fisher, Makayla Fisher     Account Number:  [id number]     Admit date:  06/12/2014  Clinical Social Worker:  Earlie Server  Date/Time:  06/13/2014 09:45 AM  Referred by:  Physician  Date Referred:  06/13/2014 Referred for  ALF Placement   Other Referral:   Interview type:  Patient Other interview type:    PSYCHOSOCIAL DATA Living Status:  FACILITY Admitted from facility:  [facility] Level of care:  Assisted Living Primary support name:  Makayla Fisher Primary support relationship to patient:  FAMILY Degree of support available:   Strong    CURRENT CONCERNS Current Concerns  Post-Acute Placement   Other Concerns:    SOCIAL WORK ASSESSMENT / PLAN CSW received referral due to patient being admitted from a facility. CSW reviewed chart and met with patient, niece and son-in-law at bedside. CSW introduced myself and explained role.    Patient reports she has been living at [facility] for about 1 year and enjoys living there. Patient reports that she came to the hospital for a procedure but MD reports she might be ready to DC over the weekend. CSW explained CSW role and that CSW would assist with transfer back to ALF once medically stable.    CSW spoke with Estill Bamberg at [facility] who reports they can accept patient back over the weekend but would need to know about any medication changes today. RN agreeable to ask MD about medications changes and will keep CSW updated.   Assessment/plan status:  Psychosocial Support/Ongoing Assessment of Needs Other assessment/ plan:   Information/referral to community resources:   Will return to ALF    PATIENT'S/FAMILY'S RESPONSE TO PLAN OF CARE: Patient alert but drowsy. Patient reports she recently received a pill and it has made her sleepy. Patient agreeable for family to be involved and reports they assist her as needed. Family involved and concerned about  patient returning back to facility because niece left messages with [facility] yesterday and did not receive a return call. CSW reassured family that CSW would get in contact with ALF and provide information prior to patient DC back to facility. Family reports they appreciate help and are just worried about ensuring no gaps in patient's medical care. CSW validated feelings and agreeable to keep family updated on DC plans.       Sindy Messing, LCSW  (Coverage for Werner Lean)   Addendum [phone] Per RN, MD stated patient should be at hospital over the weekend and will plan to DC on Monday. CSW updated ALF who is agreeable to accept patient on Monday. CSW placed FL2 on chart for MD signature and will continue to follow.

## 2014-06-13 NOTE — Consult Note (Signed)
WOC ostomy consult note Stoma type/location: LLQ Colostomy (revised from previous, same location) Stomal assessment/size: 2 inches round, red, moist, viable, edematous Peristomal assessment: intact, clear.  Crease on either side remains as in preoperative situation Treatment options for stomal/peristomal skin: skin barrier ring, one-piece flexible pouching system Output scant yellow mucous Ostomy pouching: 1pc.flebible drainable fecal pouch, Kellie Simmering #[phone].  Skin barrier ring, Kellie Simmering [phone].  May require placement of 2 barrier rings in the future.  I will observe and follow..  Education provided: Patient and caregiver (niece) taught that edematous stoma is normal and that color and appearance is what is expected.  Both are in chock as this is considerably larger than the previous, stenosed stoma.  Patient and niece are quite concerned that stoma was not relocated and that it remains in the same deep crease.  Reassured that there are methods to fill the crease and if a predictable wear time is not achievable with the current pouching system, that she may be a candidate for ostomy irrigation.  Recommend HHRN to visit patient in her [place] for the first several weeks post discharge to assist with resizing of the ostomy and adjustment of the aperature in the one-piece pouching system, also to teach application of pouch with skin barrier ring. Patient reassured that stool would drop into pouching system, also that she could bathe and shower with or without a pouch.  She will resume eating today; I have encouraged her not to "baby" the pouch and to move and eat freely so that she can gain trust and so that we can see what will happen to this initial pouching system.  Lock and Roll closure is demonstrated (she used a clamp in the past) and simulated emptying is demonstrated. Patient is appropriately anxious and relieved.  I will make an afternoon visit tomorrow. Registered with [facility]:  Yes. [place] nursing team will follow, and will remain available to this patient, the nursing, surgical and medical teams.   Thanks, Maudie Flakes, MSN, RN, [place], [place], [place] [phone])

## 2014-06-13 NOTE — Progress Notes (Signed)
1 Day Post-Op colostomy revision Subjective: Pt doing well.  Feels sore around ostomy and inside abd.  Tolerating soft diet.  No nausea.  Objective: Vital signs in last 24 hours: Temp:  [97 F (36.1 C)-102.6 F (39.2 C)] 99.5 F (37.5 C) (02/19 0650) Pulse Rate:  [54-80] 76 (02/19 0650) Resp:  [10-18] 16 (02/19 0650) BP: (120-167)/(34-80) 135/60 mmHg (02/19 0650) SpO2:  [97 %-100 %] 98 % (02/19 0650) FiO2 (%):  [2 %] 2 % (02/18 1440)   Intake/Output from previous day: 02/18 0701 - 02/19 0700 In: 2903.8 [P.O.:360; I.V.:2543.8] Out: 2000 [Urine:2000] Intake/Output this shift:     General appearance: alert and cooperative GI: normal findings: soft, non-tender  Incision: no significant drainage  Lab Results:   Recent Labs  06/11/14 1052  WBC 3.2*  HGB 11.3*  HCT 34.5*  PLT 98*   BMET  Recent Labs  06/11/14 1052  NA 141  K 4.9  CL 110  CO2 25  GLUCOSE 97  BUN 14  CREATININE 0.77  CALCIUM 9.5   PT/INR No results for input(s): LABPROT, INR in the last 72 hours. ABG No results for input(s): PHART, HCO3 in the last 72 hours.  Invalid input(s): PCO2, PO2  MEDS, Scheduled . amLODipine  5 mg Oral q morning - 10a  . carvedilol  3.125 mg Oral Q breakfast  . cycloSPORINE  1 drop Both Eyes BID  . ezetimibe  10 mg Oral q morning - 10a  . famotidine  20 mg Oral BID  . furosemide  20 mg Oral Daily  . lisinopril  20 mg Oral BID  . rOPINIRole  1 mg Oral QHS  . sertraline  25 mg Oral q morning - 10a  . simvastatin  10 mg Oral QPM  . tiotropium  18 mcg Inhalation Daily    Studies/Results: Dg Chest 2 View  06/11/2014   CLINICAL DATA:  Preop for revision of colostomy, non-Hodgkin's lymphoma  EXAM: CHEST  2 VIEW  COMPARISON:  PET-CT of 05/01/2014, and CT chest of 08/19/2013  FINDINGS: No active infiltrate or effusion is seen. The lungs are slightly hyperaerated. Right-sided Port-A-Cath tip is seen to the lower SVC. The heart is mildly enlarged. The bones are  osteopenic. There is a compression of a mid lower thoracic vertebral body in the region of T8 of approximately 50% which appears new when compared to the CT abdomen pelvis of 08/23/2013  IMPRESSION: 1. No active infiltrate or effusion.  Slight hyperaeration. 2. Compression deformity of T8 of approximately 50% which appears new when compared to the CT from May of 2015. Correlate clinically.   Electronically Signed   By: Ivar Drape M.D.   On: 06/11/2014 14:19    Assessment: s/p Procedure(s): COLOSTOMY REVISION Patient Active Problem List   Diagnosis Date Noted  . 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 Preop cardiovascular exam 04/08/2014  . NHL (non-Hodgkin's lymphoma) 10/18/2013  . 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   Possible d/c in AM   LOS: 1 day     .Rosario Adie, [facility], [place]   06/13/2014 7:54 AM

## 2014-06-14 MED ORDER — LEVOTHYROXINE SODIUM 75 MCG PO TABS
75.0000 ug | ORAL_TABLET | Freq: Every day | ORAL | Status: DC
  Administered 2014-06-14 – 2014-06-16 (×3): 75 ug via ORAL
  Filled 2014-06-14 (×5): qty 1

## 2014-06-14 MED ORDER — POTASSIUM CHLORIDE CRYS ER 20 MEQ PO TBCR
20.0000 meq | EXTENDED_RELEASE_TABLET | Freq: Every day | ORAL | Status: DC
  Administered 2014-06-14 – 2014-06-16 (×3): 20 meq via ORAL
  Filled 2014-06-14 (×4): qty 1

## 2014-06-14 NOTE — Progress Notes (Signed)
2 Days Post-Op colostomy revision Subjective: Pt doing well.  Feels sore around ostomy, but pain controlled.  Tolerating soft diet.  No nausea. Not ambulating much due to pain at ostomy site  Objective: Vital signs in last 24 hours: Temp:  [98.7 F (37.1 C)-100.1 F (37.8 C)] 99 F (37.2 C) (02/20 0620) Pulse Rate:  [63-74] 74 (02/20 0620) Resp:  [14-18] 18 (02/20 0620) BP: (103-133)/(40-56) 123/40 mmHg (02/20 0620) SpO2:  [95 %-100 %] 95 % (02/20 0620)   Intake/Output from previous day: 02/19 0701 - 02/20 0700 In: 180 [P.O.:180] Out: 650 [Urine:400; Stool:250] Intake/Output this shift:     General appearance: alert and cooperative GI: normal findings: soft, non-tender  Incision: no significant drainage Ostomy: pink, viable   Lab Results:   Recent Labs  06/11/14 1052  WBC 3.2*  HGB 11.3*  HCT 34.5*  PLT 98*   BMET  Recent Labs  06/11/14 1052  NA 141  K 4.9  CL 110  CO2 25  GLUCOSE 97  BUN 14  CREATININE 0.77  CALCIUM 9.5   PT/INR No results for input(s): LABPROT, INR in the last 72 hours. ABG No results for input(s): PHART, HCO3 in the last 72 hours.  Invalid input(s): PCO2, PO2  MEDS, Scheduled . amLODipine  5 mg Oral q morning - 10a  . carvedilol  3.125 mg Oral Q breakfast  . cycloSPORINE  1 drop Both Eyes BID  . ezetimibe  10 mg Oral q morning - 10a  . famotidine  20 mg Oral BID  . furosemide  20 mg Oral Daily  . lisinopril  20 mg Oral BID  . rOPINIRole  1 mg Oral QHS  . sertraline  25 mg Oral q morning - 10a  . simvastatin  10 mg Oral QPM  . tiotropium  18 mcg Inhalation Daily    Studies/Results: No results found.  Assessment: s/p Procedure(s): COLOSTOMY REVISION Patient Active Problem List   Diagnosis Date Noted  . Colon stricture 06/13/2014  . 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 Preop cardiovascular exam 04/08/2014  . NHL (non-Hodgkin's lymphoma) 10/18/2013  . 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   Cont PO tylenol and tramadol for pain control.   Possible d/c in AM   LOS: 2 days     .Rosario Adie, [facility], [place]   06/14/2014 8:03 AM

## 2014-06-14 NOTE — Consult Note (Signed)
WOC ostomy follow up Stoma type/location: LLQ Colostomy with a parastomal "gulley" Stomal assessment/size: 2 inches round, raised.   Peristomal assessment: circumferential gulley in the immediate parastomal field is more pronounced today as edema is residing.   Treatment options for stomal/peristomal skin: Two skin barrier rings needed to fill depression, flat pouch used on top of two ring.  May need 1 piece convex pouch that cuts up to 2-inches in diameter if this does not work. Output: soft light brown stool with flatus. Ostomy pouching: 1pc.flat pouch with 2 skin barrier rings used to fill defect Education provided: Patient assisted to bathroom to empty pouch and did well-except that she is not able to sit on commode to perform this task and stands over the  Commode to empty.  Cleans bottom two inches of pouch with toilet paper "wicks".  Becomes hot and lightheaded and pale immediately following this exercise and is assisted back to bed.  Additionally, patient did not like the abdominal binder that was provided today-even after it is cut down to accommodate her short waisted torso. HHRN is recommended and if patient is not steady or strong enough to manage may require short term Rehab eval.  Enrolled patient in [place] [facility] Discharge program: Yes [name] nursing team will follow, and will remain available to this patient, the nursing and medical teams.   Thanks, Maudie Flakes, MSN, RN, [place], [place], [place] [phone])

## 2014-06-15 NOTE — Progress Notes (Signed)
3 Days Post-Op colostomy revision Subjective: Pt doing well.  Feels sore around ostomy, but pain controlled with non-narcotic medications.  Tolerating soft diet.  No nausea. Not ambulating much due to pain at ostomy site.  Had an episode of presyncope when in the bathroom yesterday.  Objective: Vital signs in last 24 hours: Temp:  [97.9 F (36.6 C)-98.7 F (37.1 C)] 98.6 F (37 C) (02/21 0630) Pulse Rate:  [61-77] 71 (02/21 0933) Resp:  [18] 18 (02/21 0630) BP: (105-135)/(49-59) 135/49 mmHg (02/21 0933) SpO2:  [100 %] 100 % (02/21 0922)   Intake/Output from previous day: 02/20 0701 - 02/21 0700 In: 1030 [P.O.:1030] Out: 2550 [Urine:2000; Stool:550] Intake/Output this shift:     General appearance: alert and cooperative GI: normal findings: soft, non-tender  Incision: no significant drainage Ostomy: pink, viable, functioning well   Lab Results:  No results for input(s): WBC, HGB, HCT, PLT in the last 72 hours. BMET No results for input(s): NA, K, CL, CO2, GLUCOSE, BUN, CREATININE, CALCIUM in the last 72 hours. PT/INR No results for input(s): LABPROT, INR in the last 72 hours. ABG No results for input(s): PHART, HCO3 in the last 72 hours.  Invalid input(s): PCO2, PO2  MEDS, Scheduled . amLODipine  5 mg Oral q morning - 10a  . carvedilol  3.125 mg Oral Q breakfast  . cycloSPORINE  1 drop Both Eyes BID  . ezetimibe  10 mg Oral q morning - 10a  . famotidine  20 mg Oral BID  . furosemide  20 mg Oral Daily  . levothyroxine  75 mcg Oral QAC breakfast  . lisinopril  20 mg Oral BID  . potassium chloride SA  20 mEq Oral Daily  . rOPINIRole  1 mg Oral QHS  . sertraline  25 mg Oral q morning - 10a  . simvastatin  10 mg Oral QPM  . tiotropium  18 mcg Inhalation Daily    Studies/Results: No results found.  Assessment: s/p Procedure(s): COLOSTOMY REVISION Patient Active Problem List   Diagnosis Date Noted  . Colon stricture 06/13/2014  . Colostomy stricture  06/12/2014  . Preop cardiovascular exam 04/08/2014  . NHL (non-Hodgkin's lymphoma) 10/18/2013  . 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  Will get PT to assess before discharging back to assisted living facility  Cont PO tylenol and tramadol for pain control.   Possible d/c in AM   LOS: 3 days     .Rosario Adie, [facility], [place] [phone]   06/15/2014 9:49 AM

## 2014-06-15 NOTE — Evaluation (Signed)
Physical Therapy Evaluation Patient Details Name: Makayla Fisher MRN: [id number] DOB: Jul 03, 1922 Today's Date: 06/15/2014   History of Present Illness  pt had colostomy revision due to a stricture on 06/12/2014. pt with recent history of lymphoma as well, underwent chem for that. Live is Assisted living at [facility].   Clinical Impression  Pt tolerated session well, limited though due to just up with nursing for a bit at [facility], so asked if we could perform walking in the afternoon. Did complete bed mobility education to help minimize the pain. Feel pt to benefit from PT to continue to improve and gt to modI level to return to her apartment. Recommend OT order as well to help assist with ADLS.     Follow Up Recommendations SNF (if pateint progresses nicely and can perform bed mobility independent , she could go to her independent apartment, if not she willl need rehab. will follow her progress. )    Equipment Recommendations  None recommended by PT    Recommendations for Other Services       Precautions / Restrictions Precautions Precautions:  (colostomy bag L side)      Mobility  Bed Mobility Overal bed mobility: Needs Assistance Bed Mobility: Sidelying to Sit   Sidelying to sit: Mod assist       General bed mobility comments: educated pt on sidelying first prior to rolling on back to minimize use of adomen and minimize pain. pt tried this way with assistance still to get into the bed and found much improved relief with this manuever. Will try supine to side to OOB this afternoon.   Transfers Overall transfer level: Needs assistance Equipment used: Rolling walker (2 wheeled) Transfers: Sit to/from Stand Sit to Stand: Min assist         General transfer comment: to rise little difficult form weakness and pain it causes in abdomen  Ambulation/Gait Ambulation/Gait assistance: Min guard Ambulation Distance (Feet): 7 Feet Assistive device: Rolling walker (2  wheeled) Gait Pattern/deviations: Step-to pattern Gait velocity: slow guarded   General Gait Details: not a lot of distance this morning due to wanted to wait to walk til this afternoon secondary had beenup at [facility] with nursing for some bit and would liket o rest. So only observed a turn pivot with RW from [facility] to Bed.   Stairs            Wheelchair Mobility    Modified Rankin (Stroke Patients Only)       Balance Overall balance assessment: No apparent balance deficits (not formally assessed)                                           Pertinent Vitals/Pain Pain Assessment: 0-10 Pain Score: 4  Pain Location: Left abdomen under colostmy section.  Pain Descriptors / Indicators: Sore Pain Intervention(s): Monitored during session;RN gave pain meds during session    Home Living Family/patient expects to be discharged to:: Assisted living               Home Equipment: Walker - 4 wheels      Prior Function Level of Independence: Independent with assistive device(s)         Comments: uses rollator around apartment and to [employer].      Hand Dominance        Extremity/Trunk Assessment   Upper Extremity Assessment: Defer to  OT evaluation           Lower Extremity Assessment: Generalized weakness (and limited due to pain in abdomen)         Communication   Communication: No difficulties  Cognition Arousal/Alertness: Awake/alert Behavior During Therapy: WFL for tasks assessed/performed Overall Cognitive Status: Within Functional Limits for tasks assessed                      General Comments      Exercises        Assessment/Plan    PT Assessment Patient needs continued PT services  PT Diagnosis Generalized weakness   PT Problem List Decreased strength;Decreased activity tolerance;Decreased mobility  PT Treatment Interventions DME instruction;Gait training;Functional mobility training;Therapeutic  activities;Therapeutic exercise;Patient/family education   PT Goals (Current goals can be found in the Care Plan section) Acute Rehab PT Goals Patient Stated Goal: If I can move and get in and out of bed and manage t=oleting I could go to my apratment. I am aware that if I cannot, I may need rehab PT Goal Formulation: With patient Time For Goal Achievement: 06/29/14 Potential to Achieve Goals: Good    Frequency Min 3X/week   Barriers to discharge        Co-evaluation               End of Session   Activity Tolerance: Patient limited by fatigue;Patient limited by pain (will check back in afternoon for walkign with patient) Patient left: in bed;with call bell/phone within reach Nurse Communication: Mobility status         Time: 1020-1040 PT Time Calculation (min) (ACUTE ONLY): 20 min   Charges:   PT Evaluation $Initial PT Evaluation Tier I: 1 Procedure     PT G CodesClide Dales Jun 24, 2014, 12:45 PM  Clide Dales, PT Pager: [phone] 06/24/2014

## 2014-06-15 NOTE — Progress Notes (Signed)
Physical Therapy Treatment Patient Details Name: Makayla Fisher MRN: [id number] DOB: 15-Nov-1922 Today's Date: 06/15/2014    History of Present Illness pt had colostomy revision due to a stricture on 06/12/2014. pt with recent history of lymphoma as well, underwent chem for that. Live is Assisted living at [facility].     PT Comments    Improved on second session . Feel pt may be able to transition to her ALF with HHPT, and she can ask for help if she feels she needs it. She is regaining strength, but will continue to benefit from PT to ensure safety and mobility back to baseline. Fatigues easily.    Follow Up Recommendations  Home health PT (feel pt may be able to transition to her ALF, with HHPT)     Equipment Recommendations  None recommended by PT    Recommendations for Other Services       Precautions / Restrictions Precautions Precautions:  (colostomy bag L side)    Mobility  Bed Mobility Overal bed mobility: Needs Assistance Bed Mobility: Sit to Sidelying   Sidelying to sit: Min assist       General bed mobility comments: educated pt on sidelying first prior to rolling on back to minimize use of adomen and minimize pain. pt tried this way with assistance still to get into the bed and found much improved relief with this manuever. Will try supine to side to OOB this afternoon.   Transfers Overall transfer level: Needs assistance Equipment used: Rolling walker (2 wheeled) Transfers: Sit to/from Stand Sit to Stand: Min guard         General transfer comment: improved tranfers this sessiona dn use of grab bar in bathroom   Ambulation/Gait Ambulation/Gait assistance: Min guard Ambulation Distance (Feet): 225 Feet Assistive device: Rolling walker (2 wheeled) Gait Pattern/deviations: Step-through pattern Gait velocity: good pace    General Gait Details: not a lot of distance this morning due to wanted to wait to walk til this afternoon secondary had beenup  at [facility] with nursing for some bit and would liket o rest. So only observed a turn pivot with RW from [facility] to Bed.    Stairs            Wheelchair Mobility    Modified Rankin (Stroke Patients Only)       Balance Overall balance assessment: No apparent balance deficits (not formally assessed)                                  Cognition Arousal/Alertness: Awake/alert Behavior During Therapy: WFL for tasks assessed/performed Overall Cognitive Status: Within Functional Limits for tasks assessed                      Exercises      General Comments        Pertinent Vitals/Pain Pain Assessment: 0-10 Pain Score: 2  Pain Location: Left abdomen Pain Descriptors / Indicators: Sore Pain Intervention(s): Monitored during session    Home Living Family/patient expects to be discharged to:: Assisted living             Home Equipment: Walker - 4 wheels      Prior Function Level of Independence: Independent with assistive device(s)      Comments: uses rollator around apartment and to [employer].    PT Goals (current goals can now be found in the care plan section) Acute Rehab  PT Goals Patient Stated Goal: If I can move and get in and out of bed and manage t=oleting I could go to my apratment. I am aware that if I cannot, I may need rehab PT Goal Formulation: With patient Time For Goal Achievement: 06/29/14 Potential to Achieve Goals: Good Progress towards PT goals: Progressing toward goals    Frequency  Min 3X/week    PT Plan Discharge plan needs to be updated    Co-evaluation             End of Session   Activity Tolerance: Patient tolerated treatment well Patient left: in bed;with call bell/phone within reach     Time: 1530-1555 PT Time Calculation (min) (ACUTE ONLY): 25 min  Charges:  $Gait Training: 8-22 mins $Therapeutic Activity: 8-22 mins                    G CodesClide Dales 06-18-14, 4:17 PM   Clide Dales, PT Pager: [phone] 06/18/14

## 2014-06-15 NOTE — Progress Notes (Signed)
Dr. Marcello Moores aware of BP 909/03 with diastolic consistently in the 50's. HR 71. Pt asymptomatic. Reviewed BP meds with MD who instructed to continue as ordered.

## 2014-06-16 MED ORDER — TRAMADOL HCL 50 MG PO TABS: 50.0000 mg | ORAL_TABLET | Freq: Four times a day (QID) | ORAL | Status: DC | PRN

## 2014-06-16 NOTE — Consult Note (Signed)
WOC ostomy follow up Stoma type/location: LLQ, end stoma Stomal assessment/size: 2" round, budded nicely  Peristomal assessment: gullies at 3 and 9 o'clock Treatment options for stomal/peristomal skin: 2" (2) barrier rings used around the stoma to flatted the gullies at each side of the stoma.  Output thin, yellow-brown effluent  Ostomy pouching: 1pc.flat used today.    Education provided:  Pt removed the old pouch, it was just before leaking so we decided she should plan to change her pouch every 2 days and she is very happy with this plan based on her previous experience with a stoma. She cut the new pouch at 2" round and then talked me through the steps of adding the 2 barrier rings around her stoma.  She is independent with emptying and using wick to clean the bottom of the pouch. She watched me place the new pouch, I think she will need assistance with this portion of the care as she has difficulty seeing through the pouch and placement.  Enrolled patient in [place] Discharge program: Yes per my partner last week.  I have placed 5 1pc pouches and 6 barrier rings in the patient belongings for DC to ALF today.  Would benefit from [facility] at ALF if possible to continue to assist with care.   I have written out each step of her pouch change per her request.  I have also written out instructions for contacting [facility] about her pouches, which she seems most concerned about.  I have spoken with her niece [name] about the same.     [place] team will follow along with you for support and ostomy care/teaching  Luvenia Cranford Liane Comber RN,CWOCN [phone]

## 2014-06-16 NOTE — Care Management Note (Signed)
    Page 1 of 1   06/16/2014     10:53:45 AM CARE MANAGEMENT NOTE 06/16/2014  Patient:  Makayla Fisher, Makayla Fisher   Account Number:  [id number]  Date Initiated:  06/13/2014  Documentation initiated by:  Sunday Spillers  Subjective/Objective Assessment:   81 yo female admitted s/p colostomy reversion. PTA lived at [facility].     Action/Plan:   Return to ALF when stable   Anticipated DC Date:  06/13/2014   Anticipated DC Plan:  [place]  CM consult      [facility] Choice  Resumption Of Svcs/PTA Provider   Choice offered to / List presented to:          [facility] arranged  [facility]-2 PT      [place] agency  OTHER - SEE NOTE   Status of service:  Completed, signed off Medicare Important Message given?  YES (If response is "NO", the following Medicare IM given date fields will be blank) Date Medicare IM given:  06/16/2014 Medicare IM given by:  [facility] Date Additional Medicare IM given:   Additional Medicare IM given by:    Discharge Disposition:  [place]  Per UR Regulation:  Reviewed for med. necessity/level of care/duration of stay  If discussed at [place] of Stay Meetings, dates discussed:    Comments:  Per ALF, PT services will be provided on site.

## 2014-06-16 NOTE — Discharge Instructions (Signed)

## 2014-06-16 NOTE — Progress Notes (Signed)
Discharge instructions reviewed with patient and patient's niece, patient to follow up with Marcello Moores MD within 2 weeks, vital signs are stable, questions and concerns Limmie Patricia RN 06-16-2014 12:45pm

## 2014-06-16 NOTE — Discharge Summary (Signed)
Physician Discharge Summary  Patient ID: Makayla Fisher MRN: [id number] DOB/AGE: 01/08/1923 81 y.o.  Admit date: 06/12/2014 Discharge date: 06/16/2014  Admission Diagnoses: Colostomy stricture  Discharge Diagnoses:  Active Problems:   Colostomy stricture   Colon stricture   Discharged Condition: good  Hospital Course: Patient admitted after surgery.  Her recovery was slower than expected but she gradually regained her strength.  She was seen by PT on POD 3 and thought to be safe to be discharged to her ALF with [facility] PT.  We will arrange this and plan on d/c today.  Her ostomy is functioning well and her pain is controlled.    Consults: None  Significant Diagnostic Studies: labs: cbc, chemistry  Treatments: IV hydration and analgesia: acetaminophen  Discharge Exam: Blood pressure 117/48, pulse 61, temperature 99.5 F (37.5 C), temperature source Oral, resp. rate 18, height _0  (1.575 m), weight 155 lb (70.308 kg), SpO2 97 %. General appearance: alert and cooperative GI: normal findings: soft, non-tender ostomy: pink, viable, functioning  Disposition: 01-Home or Self Care     Medication List    TAKE these medications        acetaminophen 500 MG tablet  Commonly known as:  TYLENOL  Take 500 mg by mouth every 6 (six) hours as needed for mild pain.     amLODipine 5 MG tablet  Commonly known as:  NORVASC  Take 5 mg by mouth every morning.     aspirin EC 81 MG tablet  Take 81 mg by mouth daily.     bell[place]ona alk-PHENObarbital 16.2 MG tablet  Commonly known as:  DONNATAL  Take 1 pill every 6 hrs if needed for abdominal pain     carvedilol 3.125 MG tablet  Commonly known as:  COREG  Take 3.125 mg by mouth daily.     cycloSPORINE 0.05 % ophthalmic emulsion  Commonly known as:  RESTASIS  Place 1 drop into both eyes 2 (two) times daily.     ezetimibe 10 MG tablet  Commonly known as:  ZETIA  Take 10 mg by mouth every morning.     famotidine 20 MG tablet   Commonly known as:  PEPCID  Take 1 tablet (20 mg total) by mouth 2 (two) times daily.     fluconazole 100 MG tablet  Commonly known as:  DIFLUCAN  Take 1 tablet (100 mg total) by mouth daily.     furosemide 20 MG tablet  Commonly known as:  LASIX  Take 20 mg by mouth daily.     levothyroxine 75 MCG tablet  Commonly known as:  SYNTHROID, LEVOTHROID  Take 75 mcg by mouth daily before breakfast.     lidocaine-prilocaine cream  Commonly known as:  EMLA  Apply 1 application topically as needed (for port access).     lisinopril 20 MG tablet  Commonly known as:  PRINIVIL,ZESTRIL  Take 20 mg by mouth 2 (two) times daily.     LORazepam 0.5 MG tablet  Commonly known as:  ATIVAN  Take 0.5 mg by mouth daily as needed for anxiety.     Menthol (Topical Analgesic) 1.4 % Ptch  Apply 1 patch topically daily.     multivitamin with minerals Tabs tablet  Take 1 tablet by mouth daily.     nystatin cream  Commonly known as:  MYCOSTATIN  Apply 1 application topically daily as needed for dry skin.     ondansetron 8 MG tablet  Commonly known as:  ZOFRAN  Take 1 tablet (8  mg total) by mouth 2 (two) times daily. For 4 days following chemo     polyethylene glycol packet  Commonly known as:  MIRALAX / GLYCOLAX  Take 17 g by mouth daily as needed for mild constipation.     potassium chloride SA 20 MEQ tablet  Commonly known as:  K-DUR,KLOR-CON  Take 1 tablet (20 mEq total) by mouth daily.     prochlorperazine 10 MG tablet  Commonly known as:  COMPAZINE  Take 0.5 tablets (5 mg total) by mouth every 6 (six) hours as needed for nausea or vomiting.     rOPINIRole 1 MG tablet  Commonly known as:  REQUIP  Take 1 mg by mouth at bedtime.     sertraline 25 MG tablet  Commonly known as:  ZOLOFT  Take 25 mg by mouth every morning.     simvastatin 10 MG tablet  Commonly known as:  ZOCOR  Take 10 mg by mouth daily.     SPIRIVA RESPIMAT 2.5 MCG/ACT Aers  Generic drug:  Tiotropium Bromide  Monohydrate  Inhale 1 puff into the lungs daily.     traMADol 50 MG tablet  Commonly known as:  ULTRAM  Take 1 tablet (50 mg total) by mouth every 6 (six) hours as needed for moderate pain.     vitamin C 500 MG tablet  Commonly known as:  ASCORBIC ACID  Take 500 mg by mouth daily.     Vitamin D (Ergocalciferol) 50000 UNITS Caps capsule  Commonly known as:  DRISDOL  Take 50,000 Units by mouth every 7 (seven) days.     Vitamin D 2000 UNITS tablet  Take 2,000 Units by mouth daily.           Follow-up Information    Follow up with Rosario Adie., MD. Schedule an appointment as soon as possible for a visit in 2 weeks.   Specialty:  General Surgery   Contact information:   [address] [place] [address] [phone]       Signed: Rosario Adie 79/04/7492, 4:79 AM

## 2014-06-16 NOTE — Progress Notes (Addendum)
Clinical Social Work  CSW faxed DC summary and FL2 to [facility] and spoke with [employer]. ALF reports they have reviewed information and can accept patient back today. ALF to arrange [facility] RN and PT for patient. ALF reports no need for Ativan prescription but script for Ultram needed. CSW prepared DC packet with FL2, DC summary, hard scripts, and WOC note included.   CSW informed patient of DC plans. Patient reports she is happy to return back to ALF today and is ready to "get out of the hospital." Patient asked CSW to call niece [name]) who will provide transportation. CSW spoke with niece via phone who plans to come pick up patient around 12:30. Bedside RN aware of plans and will provide DC packet with patient and family at [place] is signing off but available if further needs arise.  Sindy Messing, LCSW (Coverage for [facility])  Addendum 1145 Per ALF, clarification needed for how long Diflucan needed at DC. MD paged and reports that medication was on home med list and was resumed. CSW spoke with Estill Bamberg at [place] who reports that she will speak with MD who prescribed Diflucan for further instructions. ALF remains agreeable to accept.

## 2014-07-10 ENCOUNTER — Other Ambulatory Visit: Payer: Self-pay | Admitting: Family

## 2014-07-13 NOTE — Progress Notes (Signed)
Patient ID: Makayla Fisher, female   DOB: Apr 05, 1923, 81 y.o.   MRN: [id number]    81 y.o.  Referred on 12/15  for preop clearance Has follicular large cell lymphoma of abdomen.  Ongoing Rx with chemo by Dr Marin Olp.  Has ostomy and wants to have It reversed  It is not external , leaks stool and gets irritated often  Colostomy done 2014 She underwent an open right colectomy and sigmoid colectomy with ileotransverse anastomosis and end descending colostomy with Hartmann's pouch. She had what sounds like a very long protracted post operative course requiring mechanical ventilation.  It also appears that she went into acute renal failure. Surgery was done emergently due to diverticular perforation with stricture.  PET scan 8/15 showed near total resolution of cancer in abdomen and repeat scan planned for January  Distant history of CAD  With stent /CABG in [facility]  Records not available in [place]  Echo done 3/15 for chemo in [place] showed normal EF , abnormal relaxation and mild MR  Reviewed  04/09/14 carotid duplex reviewed  27-06% LICA stenosis.  F/U duplex in one year 12/16  2/18  Had colostomy revision for stricture with slow recovery but no cardiac complications by [name]  ROS: Denies fever, malais, weight loss, blurry vision, decreased visual acuity, cough, sputum, SOB, hemoptysis, pleuritic pain, palpitaitons, heartburn, abdominal pain, melena, lower extremity edema, claudication, or rash.  All other systems reviewed and negative   General: Affect appropriate Elderly spry female  HEENT: normal Neck supple with no adenopathy JVP normal no bruits no thyromegaly Lungs clear with no wheezing and good diaphragmatic motion Heart:  S1/S2 SEM  murmur,rub, gallop or click previous sternotomy PMI normal Abdomen: benighn, BS positve, no tenderness, no AAA ostomy with bag LLQ no bruit.  No HSM or HJR Distal pulses intact with no bruits No edema Neuro  non-focal Skin warm and dry No muscular weakness  Medications Current Outpatient Prescriptions  Medication Sig Dispense Refill  . amLODipine (NORVASC) 5 MG tablet Take 5 mg by mouth every morning.     Marland Kitchen aspirin EC 81 MG tablet Take 81 mg by mouth daily.    . carvedilol (COREG) 3.125 MG tablet Take 3.125 mg by mouth daily.     . Cholecalciferol (VITAMIN D) 2000 UNITS tablet Take 2,000 Units by mouth daily.    . cycloSPORINE (RESTASIS) 0.05 % ophthalmic emulsion Place 1 drop into both eyes 2 (two) times daily.     Marland Kitchen ezetimibe (ZETIA) 10 MG tablet Take 10 mg by mouth every morning.     . famotidine (PEPCID) 20 MG tablet Take 1 tablet (20 mg total) by mouth 2 (two) times daily. 60 tablet 6  . furosemide (LASIX) 20 MG tablet Take 20 mg by mouth daily.     Marland Kitchen levothyroxine (SYNTHROID, LEVOTHROID) 75 MCG tablet Take 75 mcg by mouth daily before breakfast.    . lisinopril (PRINIVIL,ZESTRIL) 20 MG tablet Take 20 mg by mouth 2 (two) times daily.    . Menthol, Topical Analgesic, 1.4 % PTCH Apply 1 patch topically daily.    . Multiple Vitamin (MULTIVITAMIN WITH MINERALS) TABS tablet Take 1 tablet by mouth daily.    Marland Kitchen nystatin cream (MYCOSTATIN) Apply 1 application topically daily as needed for dry skin.    . polyethylene glycol (MIRALAX / GLYCOLAX) packet Take 17 g by mouth daily as needed for mild constipation.    . potassium chloride SA (K-DUR,KLOR-CON) 20 MEQ tablet Take  1 tablet (20 mEq total) by mouth daily. 30 tablet 2  . rOPINIRole (REQUIP) 1 MG tablet Take 1 mg by mouth at bedtime.    . sertraline (ZOLOFT) 25 MG tablet Take 25 mg by mouth every morning.     . simvastatin (ZOCOR) 10 MG tablet Take 10 mg by mouth daily.    . Tiotropium Bromide Monohydrate (SPIRIVA RESPIMAT) 2.5 MCG/ACT AERS Inhale 1 puff into the lungs daily.    . vitamin C (ASCORBIC ACID) 500 MG tablet Take 500 mg by mouth daily.    . Vitamin D, Ergocalciferol, (DRISDOL) 50000 UNITS CAPS capsule Take 50,000 Units by mouth every  7 (seven) days.    Marland Kitchen acetaminophen (TYLENOL) 500 MG tablet Take 500 mg by mouth every 6 (six) hours as needed for mild pain.    Marland Kitchen belladona alk-PHENObarbital (DONNATAL) 16.2 MG tablet Take 1 pill every 6 hrs if needed for abdominal pain (Patient not taking: Reported on 07/15/2014) 60 tablet 2  . lidocaine-prilocaine (EMLA) cream Apply 1 application topically as needed (for port access).    . LORazepam (ATIVAN) 0.5 MG tablet Take 0.5 mg by mouth daily as needed for anxiety.    . ondansetron (ZOFRAN) 8 MG tablet Take 1 tablet (8 mg total) by mouth 2 (two) times daily. For 4 days following chemo (Patient not taking: Reported on 07/15/2014) 20 tablet 2  . prochlorperazine (COMPAZINE) 10 MG tablet Take 0.5 tablets (5 mg total) by mouth every 6 (six) hours as needed for nausea or vomiting. (Patient not taking: Reported on 07/15/2014) 30 tablet 3  . traMADol (ULTRAM) 50 MG tablet Take 1 tablet (50 mg total) by mouth every 6 (six) hours as needed for moderate pain. (Patient not taking: Reported on 07/15/2014) 30 tablet 0   No current facility-administered medications for this visit.    Allergies Epipen; Heparin; Levaquin; Chlorhexidine gluconate; and Sulfa antibiotics  Family History: Family History  Problem Relation Age of Onset  . Diverticulitis Mother   . Heart attack Mother   . Ulcers Father     Social History: History   Social History  . Marital Status: Married    Spouse Name: N/A  . Number of Children: N/A  . Years of Education: N/A   Occupational History  . Not on file.   Social History Main Topics  . Smoking status: Former Smoker -- 1.00 packs/day for 25 years    Types: Cigarettes    Start date: 05/16/1948    Quit date: 05/17/1963  . Smokeless tobacco: Never Used     Comment: quit smoking 50 years ago  . Alcohol Use: No  . Drug Use: No  . Sexual Activity: Not on file   Other Topics Concern  . Not on file   Social History Narrative    Past Surgical History  Procedure  Laterality Date  . Colon surgery  2014    Colostomy  . Cardiac surgery  2009  . Coronary angioplasty with stent placement  2009  . C section  1947, 1963  . Cholecystectomy  1973  . Esophagogastroduodenoscopy  2013  . Coronary artery bypass graft  aug 2009     x3  . Right chest pac  june 2015  . Eye surgery Bilateral  10 years ago    lens replacments cataracts  . Colostomy revision N/A 06/12/2014    Procedure: COLOSTOMY REVISION;  Surgeon: [name], MD;  Location: [facility] ORS;  Service: General;  Laterality: N/A;    Past Medical History  Diagnosis Date  . Heart attack 2009  . GERD (gastroesophageal reflux disease)   . NHL (non-Hodgkin's lymphoma) 10/18/2013    finished chemo dec 2015, maintenanance retuxin  . History of blood transfusion 2009    Electrocardiogram:  4/27  SR rate 71  LAE no old MI  Low voltage   Assessment and Plan

## 2014-07-15 ENCOUNTER — Encounter: Payer: Self-pay | Admitting: Cardiovascular Disease

## 2014-07-15 ENCOUNTER — Ambulatory Visit (INDEPENDENT_AMBULATORY_CARE_PROVIDER_SITE_OTHER): Payer: Medicare Other | Admitting: Cardiovascular Disease

## 2014-07-15 VITALS — BP 156/62 | HR 69 | Ht 62.0 in | Wt 157.0 lb

## 2014-07-15 DIAGNOSIS — K5669 Other intestinal obstruction: Secondary | ICD-10-CM | POA: Diagnosis not present

## 2014-07-15 DIAGNOSIS — I739 Peripheral vascular disease, unspecified: Secondary | ICD-10-CM

## 2014-07-15 DIAGNOSIS — I779 Disorder of arteries and arterioles, unspecified: Secondary | ICD-10-CM | POA: Diagnosis not present

## 2014-07-15 DIAGNOSIS — I10 Essential (primary) hypertension: Secondary | ICD-10-CM | POA: Diagnosis not present

## 2014-07-15 DIAGNOSIS — R001 Bradycardia, unspecified: Secondary | ICD-10-CM | POA: Diagnosis not present

## 2014-07-15 DIAGNOSIS — K56699 Other intestinal obstruction unspecified as to partial versus complete obstruction: Secondary | ICD-10-CM

## 2014-07-15 HISTORY — DX: Bradycardia, unspecified: R00.1

## 2014-07-15 NOTE — Patient Instructions (Signed)
Your physician wants you to follow-up in:  6 MONTHS WITH DR NISHAN  You will receive a reminder letter in the mail two months in advance. If you don't receive a letter, please call our office to schedule the follow-up appointment. Your physician recommends that you continue on your current medications as directed. Please refer to the Current Medication list given to you today. 

## 2014-07-15 NOTE — Assessment & Plan Note (Signed)
Well controlled.  Continue current medications and low sodium Dash type diet.    

## 2014-07-15 NOTE — Assessment & Plan Note (Signed)
Coreg decreased to 3.125 daily in am  Follow

## 2014-07-15 NOTE — Assessment & Plan Note (Signed)
Post surgical repair mild RLQ pain Supplies very expensive for ostomy  F/u Dr Marcello Moores

## 2014-07-15 NOTE — Assessment & Plan Note (Signed)
ASA  Consider f/u of duplex in 6 months or if TIA symptoms

## 2014-08-07 ENCOUNTER — Encounter: Payer: Self-pay | Admitting: Hematology & Oncology

## 2014-08-07 ENCOUNTER — Other Ambulatory Visit (HOSPITAL_BASED_OUTPATIENT_CLINIC_OR_DEPARTMENT_OTHER): Payer: Medicare Other

## 2014-08-07 ENCOUNTER — Ambulatory Visit (HOSPITAL_BASED_OUTPATIENT_CLINIC_OR_DEPARTMENT_OTHER): Payer: Medicare Other

## 2014-08-07 ENCOUNTER — Other Ambulatory Visit: Payer: Medicare Other | Admitting: Lab

## 2014-08-07 ENCOUNTER — Ambulatory Visit: Payer: Medicare Other | Admitting: Hematology & Oncology

## 2014-08-07 ENCOUNTER — Ambulatory Visit (HOSPITAL_BASED_OUTPATIENT_CLINIC_OR_DEPARTMENT_OTHER): Payer: Medicare Other | Admitting: Hematology & Oncology

## 2014-08-07 VITALS — BP 132/68 | HR 78 | Temp 98.0°F

## 2014-08-07 VITALS — BP 146/56 | HR 60 | Temp 97.4°F | Resp 16 | Ht 62.0 in | Wt 160.0 lb

## 2014-08-07 DIAGNOSIS — D500 Iron deficiency anemia secondary to blood loss (chronic): Secondary | ICD-10-CM

## 2014-08-07 DIAGNOSIS — D509 Iron deficiency anemia, unspecified: Secondary | ICD-10-CM | POA: Diagnosis present

## 2014-08-07 DIAGNOSIS — K56699 Other intestinal obstruction unspecified as to partial versus complete obstruction: Secondary | ICD-10-CM

## 2014-08-07 DIAGNOSIS — Z933 Colostomy status: Secondary | ICD-10-CM

## 2014-08-07 DIAGNOSIS — C8590 Non-Hodgkin lymphoma, unspecified, unspecified site: Secondary | ICD-10-CM

## 2014-08-07 DIAGNOSIS — C8290 Follicular lymphoma, unspecified, unspecified site: Secondary | ICD-10-CM

## 2014-08-07 DIAGNOSIS — Z5112 Encounter for antineoplastic immunotherapy: Secondary | ICD-10-CM

## 2014-08-07 DIAGNOSIS — K9403 Colostomy malfunction: Secondary | ICD-10-CM

## 2014-08-07 HISTORY — DX: Iron deficiency anemia secondary to blood loss (chronic): D50.0

## 2014-08-07 LAB — CBC WITH DIFFERENTIAL (CANCER CENTER ONLY)
BASO#: 0 10*3/uL (ref 0.0–0.2)
BASO%: 1.4 % (ref 0.0–2.0)
EOS%: 5.5 % (ref 0.0–7.0)
Eosinophils Absolute: 0.1 10*3/uL (ref 0.0–0.5)
HEMATOCRIT: 30.1 % — AB (ref 34.8–46.6)
HGB: 9.9 g/dL — ABNORMAL LOW (ref 11.6–15.9)
LYMPH#: 0.6 10*3/uL — AB (ref 0.9–3.3)
LYMPH%: 28.9 % (ref 14.0–48.0)
MCH: 32.6 pg (ref 26.0–34.0)
MCHC: 32.9 g/dL (ref 32.0–36.0)
MCV: 99 fL (ref 81–101)
MONO#: 0.7 10*3/uL (ref 0.1–0.9)
MONO%: 30.3 % — ABNORMAL HIGH (ref 0.0–13.0)
NEUT#: 0.7 10*3/uL — ABNORMAL LOW (ref 1.5–6.5)
NEUT%: 33.9 % — ABNORMAL LOW (ref 39.6–80.0)
Platelets: 121 10*3/uL — ABNORMAL LOW (ref 145–400)
RBC: 3.04 10*6/uL — AB (ref 3.70–5.32)
RDW: 14.1 % (ref 11.1–15.7)
WBC: 2.2 10*3/uL — AB (ref 3.9–10.0)

## 2014-08-07 LAB — CMP (CANCER CENTER ONLY)
ALT(SGPT): 14 U/L (ref 10–47)
AST: 23 U/L (ref 11–38)
Albumin: 3.8 g/dL (ref 3.3–5.5)
Alkaline Phosphatase: 53 U/L (ref 26–84)
BILIRUBIN TOTAL: 0.7 mg/dL (ref 0.20–1.60)
BUN, Bld: 14 mg/dL (ref 7–22)
CHLORIDE: 109 meq/L — AB (ref 98–108)
CO2: 24 meq/L (ref 18–33)
Calcium: 8.2 mg/dL (ref 8.0–10.3)
Creat: 0.8 mg/dl (ref 0.6–1.2)
GLUCOSE: 96 mg/dL (ref 73–118)
Potassium: 3.9 mEq/L (ref 3.3–4.7)
SODIUM: 143 meq/L (ref 128–145)
TOTAL PROTEIN: 6.3 g/dL — AB (ref 6.4–8.1)

## 2014-08-07 MED ORDER — DIPHENHYDRAMINE HCL 25 MG PO CAPS
50.0000 mg | ORAL_CAPSULE | Freq: Once | ORAL | Status: AC
  Administered 2014-08-07: 50 mg via ORAL

## 2014-08-07 MED ORDER — SODIUM CHLORIDE 0.9 % IV SOLN
375.0000 mg/m2 | Freq: Once | INTRAVENOUS | Status: AC
  Administered 2014-08-07: 700 mg via INTRAVENOUS
  Filled 2014-08-07: qty 60

## 2014-08-07 MED ORDER — ACETAMINOPHEN 325 MG PO TABS
650.0000 mg | ORAL_TABLET | Freq: Once | ORAL | Status: AC
  Administered 2014-08-07: 650 mg via ORAL

## 2014-08-07 MED ORDER — SODIUM CHLORIDE 0.9 % IV SOLN
Freq: Once | INTRAVENOUS | Status: AC
  Administered 2014-08-07: 11:00:00 via INTRAVENOUS

## 2014-08-07 MED ORDER — DIPHENHYDRAMINE HCL 25 MG PO CAPS
ORAL_CAPSULE | ORAL | Status: AC
  Filled 2014-08-07: qty 2

## 2014-08-07 MED ORDER — SODIUM CHLORIDE 0.9 % IJ SOLN
10.0000 mL | INTRAMUSCULAR | Status: DC | PRN
  Administered 2014-08-07: 10 mL
  Filled 2014-08-07: qty 10

## 2014-08-07 MED ORDER — ACETAMINOPHEN 325 MG PO TABS
ORAL_TABLET | ORAL | Status: AC
  Filled 2014-08-07: qty 2

## 2014-08-07 MED ORDER — SODIUM CHLORIDE 0.9 % IV SOLN
510.0000 mg | Freq: Once | INTRAVENOUS | Status: AC
  Administered 2014-08-07: 510 mg via INTRAVENOUS
  Filled 2014-08-07: qty 17

## 2014-08-07 NOTE — Patient Instructions (Addendum)
[facility] Discharge Instructions for Patients Receiving Chemotherapy  Today you received the following chemotherapy agents Rituxan  To help prevent nausea and vomiting after your treatment, we encourage you to take your nausea medication    If you develop nausea and vomiting that is not controlled by your nausea medication, call the clinic.   BELOW ARE SYMPTOMS THAT SHOULD BE REPORTED IMMEDIATELY:  *FEVER GREATER THAN 100.5 F  *CHILLS WITH OR WITHOUT FEVER  NAUSEA AND VOMITING THAT IS NOT CONTROLLED WITH YOUR NAUSEA MEDICATION  *UNUSUAL SHORTNESS OF BREATH  *UNUSUAL BRUISING OR BLEEDING  TENDERNESS IN MOUTH AND THROAT WITH OR WITHOUT PRESENCE OF ULCERS  *URINARY PROBLEMS  *BOWEL PROBLEMS  UNUSUAL RASH Items with * indicate a potential emergency and should be followed up as soon as possible.  Feel free to call the clinic you have any questions or concerns. The clinic phone number is [phone].  Please show the [place] at check-in to the Emergency Department and triage nurse.  Ferumoxytol injection What is this medicine? FERUMOXYTOL is an iron complex. Iron is used to make healthy red blood cells, which carry oxygen and nutrients throughout the body. This medicine is used to treat iron deficiency anemia in people with chronic kidney disease. This medicine may be used for other purposes; ask your health care provider or pharmacist if you have questions. COMMON BRAND NAME(S): Feraheme What should I tell my health care provider before I take this medicine? They need to know if you have any of these conditions: -anemia not caused by low iron levels -high levels of iron in the blood -magnetic resonance imaging (MRI) test scheduled -an unusual or allergic reaction to iron, other medicines, foods, dyes, or preservatives -pregnant or trying to get pregnant -breast-feeding How should I use this medicine? This medicine is for injection into a vein. It  is given by a health care professional in a hospital or clinic setting. Talk to your pediatrician regarding the use of this medicine in children. Special care may be needed. Overdosage: If you think you've taken too much of this medicine contact a poison control center or emergency room at once. Overdosage: If you think you have taken too much of this medicine contact a poison control center or emergency room at once. NOTE: This medicine is only for you. Do not share this medicine with others. What if I miss a dose? It is important not to miss your dose. Call your doctor or health care professional if you are unable to keep an appointment. What may interact with this medicine? This medicine may interact with the following medications: -other iron products This list may not describe all possible interactions. Give your health care provider a list of all the medicines, herbs, non-prescription drugs, or dietary supplements you use. Also tell them if you smoke, drink alcohol, or use illegal drugs. Some items may interact with your medicine. What should I watch for while using this medicine? Visit your doctor or healthcare professional regularly. Tell your doctor or healthcare professional if your symptoms do not start to get better or if they get worse. You may need blood work done while you are taking this medicine. You may need to follow a special diet. Talk to your doctor. Foods that contain iron include: whole grains/cereals, dried fruits, beans, or peas, leafy green vegetables, and organ meats (liver, kidney). What side effects may I notice from receiving this medicine? Side effects that you should report to your doctor or  health care professional as soon as possible: -allergic reactions like skin rash, itching or hives, swelling of the face, lips, or tongue -breathing problems -changes in blood pressure -feeling faint or lightheaded, falls -fever or chills -flushing, sweating, or hot  feelings -swelling of the ankles or feet Side effects that usually do not require medical attention (Report these to your doctor or health care professional if they continue or are bothersome.): -diarrhea -headache -nausea, vomiting -stomach pain This list may not describe all possible side effects. Call your doctor for medical advice about side effects. You may report side effects to FDA at 1-800-FDA-1088. Where should I keep my medicine? This drug is given in a hospital or clinic and will not be stored at home. NOTE: This sheet is a summary. It may not cover all possible information. If you have questions about this medicine, talk to your doctor, pharmacist, or health care provider.  2015, Elsevier/Gold Standard. (2011-11-25 15:23:36)

## 2014-08-07 NOTE — Progress Notes (Signed)
Hematology and Oncology Follow Up Visit  Makayla Fisher [id number] 21-May-1922 81 y.o. 08/07/2014   Principle Diagnosis:   Follicular large cell non-Hodgkin's lymphoma  Current Therapy:    Status post cycle 1 of maintenance Rituxan     Interim History:  Ms.  Makayla Fisher is back for followup. She tolerated the first Rituxan dose well.  In February, she had revision of her colostomy. She was in the hospital for about 4 days. She has some weakness afterwards. She did have some physical therapy. She is doing a little bit better.  She's had no obvious issues with bleeding. She's had no nausea or vomiting.  Her colostomy seems be working pretty well.  She's been eating well. She's had no nausea or vomiting. There's been no abdominal pain. She's had no cough. She's had no fever. She's had no shortness of breath.  There has been no leg swelling.  She's had no rashes.  Overall, her performance status is ECOG 1-2.  Medications:  Current outpatient prescriptions:  .  acetaminophen (TYLENOL) 500 MG tablet, Take 500 mg by mouth every 6 (six) hours as needed for mild pain., Disp: , Rfl:  .  amLODipine (NORVASC) 5 MG tablet, Take 5 mg by mouth every morning. , Disp: , Rfl:  .  aspirin EC 81 MG tablet, Take 81 mg by mouth daily., Disp: , Rfl:  .  UNKNOWN TO PATIENT, PT LIVES IN [facility], WILL HAVE THEM FAX AN UPDATES MED LIST.  08-07-14, Disp: , Rfl:  .  [place] alk-PHENObarbital (DONNATAL) 16.2 MG tablet, Take 1 pill every 6 hrs if needed for abdominal pain (Patient not taking: Reported on 07/15/2014), Disp: 60 tablet, Rfl: 2 .  carvedilol (COREG) 3.125 MG tablet, Take 3.125 mg by mouth daily. , Disp: , Rfl:  .  Cholecalciferol (VITAMIN D) 2000 UNITS tablet, Take 2,000 Units by mouth daily., Disp: , Rfl:  .  cycloSPORINE (RESTASIS) 0.05 % ophthalmic emulsion, Place 1 drop into both eyes 2 (two) times daily. , Disp: , Rfl:  .  ezetimibe (ZETIA) 10 MG tablet, Take 10 mg by mouth every  morning. , Disp: , Rfl:  .  famotidine (PEPCID) 20 MG tablet, Take 1 tablet (20 mg total) by mouth 2 (two) times daily., Disp: 60 tablet, Rfl: 6 .  furosemide (LASIX) 20 MG tablet, Take 20 mg by mouth daily. , Disp: , Rfl:  .  levothyroxine (SYNTHROID, LEVOTHROID) 75 MCG tablet, Take 75 mcg by mouth daily before breakfast., Disp: , Rfl:  .  lidocaine-prilocaine (EMLA) cream, Apply 1 application topically as needed (for port access)., Disp: , Rfl:  .  lisinopril (PRINIVIL,ZESTRIL) 20 MG tablet, Take 20 mg by mouth 2 (two) times daily., Disp: , Rfl:  .  LORazepam (ATIVAN) 0.5 MG tablet, Take 0.5 mg by mouth daily as needed for anxiety., Disp: , Rfl:  .  Menthol, Topical Analgesic, 1.4 % PTCH, Apply 1 patch topically daily., Disp: , Rfl:  .  Multiple Vitamin (MULTIVITAMIN WITH MINERALS) TABS tablet, Take 1 tablet by mouth daily., Disp: , Rfl:  .  nystatin cream (MYCOSTATIN), Apply 1 application topically daily as needed for dry skin., Disp: , Rfl:  .  ondansetron (ZOFRAN) 8 MG tablet, Take 1 tablet (8 mg total) by mouth 2 (two) times daily. For 4 days following chemo (Patient not taking: Reported on 07/15/2014), Disp: 20 tablet, Rfl: 2 .  polyethylene glycol (MIRALAX / GLYCOLAX) packet, Take 17 g by mouth daily as needed for mild constipation., Disp: ,  Rfl:  .  potassium chloride SA (K-DUR,KLOR-CON) 20 MEQ tablet, Take 1 tablet (20 mEq total) by mouth daily., Disp: 30 tablet, Rfl: 2 .  prochlorperazine (COMPAZINE) 10 MG tablet, Take 0.5 tablets (5 mg total) by mouth every 6 (six) hours as needed for nausea or vomiting. (Patient not taking: Reported on 07/15/2014), Disp: 30 tablet, Rfl: 3 .  rOPINIRole (REQUIP) 1 MG tablet, Take 1 mg by mouth at bedtime., Disp: , Rfl:  .  sertraline (ZOLOFT) 25 MG tablet, Take 25 mg by mouth every morning. , Disp: , Rfl:  .  simvastatin (ZOCOR) 10 MG tablet, Take 10 mg by mouth daily., Disp: , Rfl:  .  Tiotropium Bromide Monohydrate (SPIRIVA RESPIMAT) 2.5 MCG/ACT AERS,  Inhale 1 puff into the lungs daily., Disp: , Rfl:  .  traMADol (ULTRAM) 50 MG tablet, Take 1 tablet (50 mg total) by mouth every 6 (six) hours as needed for moderate pain. (Patient not taking: Reported on 07/15/2014), Disp: 30 tablet, Rfl: 0 .  vitamin C (ASCORBIC ACID) 500 MG tablet, Take 500 mg by mouth daily., Disp: , Rfl:  .  Vitamin D, Ergocalciferol, (DRISDOL) 50000 UNITS CAPS capsule, Take 50,000 Units by mouth every 7 (seven) days., Disp: , Rfl:   Allergies:  Allergies  Allergen Reactions  . Epipen [Epinephrine Hcl]     Heart palpitations  . Heparin     Unsure of side affects (maybe affected platelets per pt)  . Levaquin [Levofloxacin In D5w] Other (See Comments)    Weakness  . Chlorhexidine Gluconate Other (See Comments)    Pt declined use and suspects she may have an intolerance,   . Sulfa Antibiotics Rash    Past Medical History, Surgical history, Social history, and Family History were reviewed and updated.  Review of Systems: As above  Physical Exam:  height is 5' 2"  (1.575 m) and weight is 160 lb (72.576 kg). Her oral temperature is 97.4 F (36.3 C). Her blood pressure is 146/56 and her pulse is 60. Her respiration is 16.   Elderly white female. Head and neck exam shows no ocular or oral lesions. There are no palpable cervical or supraclavicular lymph nodes. Lungs are clear bilaterally. Cardiac exam regular rate and rhythm with no murmurs rubs or bruits. Abdomen is soft. She has good bowel sounds. The ostomy is intact. There is no stool in the ostomy bag. There is no palpable liver or spleen tip. Her laparotomy scars are well-healed. Her colostomy is in the left lower quadrant. Back exam shows no tenderness over the spine ribs or hips. Extremities shows no clubbing cyanosis or edema. Skin exam no rashes, ecchymoses or petechia. She does have some areas of Candida in the skin folds of her abdomen neurological exam is nonfocal..   Lab Results  Component Value Date   WBC  2.2* 08/07/2014   HGB 9.9* 08/07/2014   HCT 30.1* 08/07/2014   MCV 99 08/07/2014   PLT 121* 08/07/2014     Chemistry      Component Value Date/Time   NA 143 08/07/2014 0929   NA 141 06/11/2014 1052   NA 141 05/08/2014 1041   K 3.9 08/07/2014 0929   K 4.9 06/11/2014 1052   K 4.5 05/08/2014 1041   CL 109* 08/07/2014 0929   CL 110 06/11/2014 1052   CO2 24 08/07/2014 0929   CO2 25 06/11/2014 1052   CO2 24 05/08/2014 1041   BUN 14 08/07/2014 0929   BUN 14 06/11/2014 1052  BUN 14.2 05/08/2014 1041   CREATININE 0.8 08/07/2014 0929   CREATININE 0.77 06/11/2014 1052   CREATININE 0.8 05/08/2014 1041      Component Value Date/Time   CALCIUM 8.2 08/07/2014 0929   CALCIUM 9.5 06/11/2014 1052   CALCIUM 9.2 05/08/2014 1041   ALKPHOS 53 08/07/2014 0929   ALKPHOS 66 05/08/2014 1041   ALKPHOS 90 09/25/2013 1141   AST 23 08/07/2014 0929   AST 23 05/08/2014 1041   AST 15 09/25/2013 1141   ALT 14 08/07/2014 0929   ALT 10 05/08/2014 1041   ALT <8 09/25/2013 1141   BILITOT 0.70 08/07/2014 0929   BILITOT 0.42 05/08/2014 1041   BILITOT 0.4 09/25/2013 1141         Impression and Plan: Ms. Gintz is 81 year old female. She has a follicular large cell lymphoma.   I will continue with the Rituxan.  I think that she also needs some iron. I did look at her blood on the microscope. She has some microcytic type red cells. Again, I think that with her recent surgery, this has put stress on her bone marrow.  We'll plan to get her back probably in a little over 2 months now. I want to make sure that her hemoglobin improves.  She does have a little bit of leukopenia. I suspect this probably is from Rituxan but she is asymptomatic with this.[name], MD 4/14/201610:21 AM

## 2014-08-11 LAB — BETA 2 MICROGLOBULIN, SERUM: Beta-2 Microglobulin: 3.85 mg/L — ABNORMAL HIGH (ref <=2.51)

## 2014-08-11 LAB — LACTATE DEHYDROGENASE: LDH: 173 U/L (ref 94–250)

## 2014-08-14 ENCOUNTER — Other Ambulatory Visit: Payer: Self-pay | Admitting: Cardiovascular Disease

## 2014-10-23 ENCOUNTER — Ambulatory Visit (HOSPITAL_BASED_OUTPATIENT_CLINIC_OR_DEPARTMENT_OTHER): Payer: Medicare Other

## 2014-10-23 ENCOUNTER — Other Ambulatory Visit (HOSPITAL_BASED_OUTPATIENT_CLINIC_OR_DEPARTMENT_OTHER): Payer: Medicare Other

## 2014-10-23 ENCOUNTER — Ambulatory Visit (HOSPITAL_BASED_OUTPATIENT_CLINIC_OR_DEPARTMENT_OTHER): Payer: Medicare Other | Admitting: Hematology & Oncology

## 2014-10-23 ENCOUNTER — Encounter: Payer: Self-pay | Admitting: Hematology & Oncology

## 2014-10-23 VITALS — BP 152/64 | HR 64 | Temp 97.3°F | Resp 16 | Ht 62.0 in | Wt 166.0 lb

## 2014-10-23 VITALS — BP 174/50 | HR 57 | Resp 16

## 2014-10-23 DIAGNOSIS — Z5111 Encounter for antineoplastic chemotherapy: Secondary | ICD-10-CM

## 2014-10-23 DIAGNOSIS — D500 Iron deficiency anemia secondary to blood loss (chronic): Secondary | ICD-10-CM | POA: Diagnosis present

## 2014-10-23 DIAGNOSIS — C8590 Non-Hodgkin lymphoma, unspecified, unspecified site: Secondary | ICD-10-CM

## 2014-10-23 DIAGNOSIS — C8290 Follicular lymphoma, unspecified, unspecified site: Secondary | ICD-10-CM | POA: Diagnosis present

## 2014-10-23 DIAGNOSIS — E559 Vitamin D deficiency, unspecified: Secondary | ICD-10-CM

## 2014-10-23 LAB — IRON AND TIBC CHCC
%SAT: 31 % (ref 21–57)
IRON: 71 ug/dL (ref 41–142)
TIBC: 229 ug/dL — AB (ref 236–444)
UIBC: 157 ug/dL (ref 120–384)

## 2014-10-23 LAB — MANUAL DIFFERENTIAL (CHCC SATELLITE)
ALC: 0.5 10*3/uL — ABNORMAL LOW (ref 0.6–2.2)
ANC (CHCC HP manual diff): 2 10*3/uL (ref 1.5–6.7)
BASO: 1 % (ref 0–2)
Eos: 4 % (ref 0–7)
LYMPH: 18 % (ref 14–48)
MONO: 11 % (ref 0–13)
PLT EST ~~LOC~~: DECREASED
SEG: 66 % (ref 40–75)

## 2014-10-23 LAB — CBC WITH DIFFERENTIAL (CANCER CENTER ONLY)
HCT: 34.7 % — ABNORMAL LOW (ref 34.8–46.6)
HGB: 11.7 g/dL (ref 11.6–15.9)
MCH: 33.5 pg (ref 26.0–34.0)
MCHC: 33.7 g/dL (ref 32.0–36.0)
MCV: 99 fL (ref 81–101)
Platelets: 118 10*3/uL — ABNORMAL LOW (ref 145–400)
RBC: 3.49 10*6/uL — AB (ref 3.70–5.32)
RDW: 13.2 % (ref 11.1–15.7)
WBC: 3 10*3/uL — ABNORMAL LOW (ref 3.9–10.0)

## 2014-10-23 LAB — CMP (CANCER CENTER ONLY)
ALT(SGPT): 12 U/L (ref 10–47)
AST: 24 U/L (ref 11–38)
Albumin: 3.5 g/dL (ref 3.3–5.5)
Alkaline Phosphatase: 53 U/L (ref 26–84)
BILIRUBIN TOTAL: 0.6 mg/dL (ref 0.20–1.60)
BUN: 12 mg/dL (ref 7–22)
CHLORIDE: 104 meq/L (ref 98–108)
CO2: 26 meq/L (ref 18–33)
Calcium: 9.6 mg/dL (ref 8.0–10.3)
Creat: 0.9 mg/dl (ref 0.6–1.2)
GLUCOSE: 95 mg/dL (ref 73–118)
Potassium: 4 mEq/L (ref 3.3–4.7)
Sodium: 139 mEq/L (ref 128–145)
TOTAL PROTEIN: 6.1 g/dL — AB (ref 6.4–8.1)

## 2014-10-23 LAB — CHCC SATELLITE - SMEAR

## 2014-10-23 LAB — LACTATE DEHYDROGENASE: LDH: 195 U/L (ref 94–250)

## 2014-10-23 LAB — FERRITIN CHCC: FERRITIN: 272 ng/mL — AB (ref 9–269)

## 2014-10-23 MED ORDER — SODIUM CHLORIDE 0.9 % IJ SOLN
10.0000 mL | INTRAMUSCULAR | Status: DC | PRN
  Administered 2014-10-23: 10 mL
  Filled 2014-10-23: qty 10

## 2014-10-23 MED ORDER — ACETAMINOPHEN 325 MG PO TABS
650.0000 mg | ORAL_TABLET | Freq: Once | ORAL | Status: AC
  Administered 2014-10-23: 650 mg via ORAL

## 2014-10-23 MED ORDER — DIPHENHYDRAMINE HCL 25 MG PO CAPS
50.0000 mg | ORAL_CAPSULE | Freq: Once | ORAL | Status: AC
  Administered 2014-10-23: 50 mg via ORAL

## 2014-10-23 MED ORDER — SODIUM CHLORIDE 0.9 % IV SOLN
Freq: Once | INTRAVENOUS | Status: AC
  Administered 2014-10-23: 11:00:00 via INTRAVENOUS

## 2014-10-23 MED ORDER — DIPHENHYDRAMINE HCL 25 MG PO CAPS
ORAL_CAPSULE | ORAL | Status: AC
  Filled 2014-10-23: qty 2

## 2014-10-23 MED ORDER — SODIUM CHLORIDE 0.9 % IJ SOLN
3.0000 mL | INTRAMUSCULAR | Status: DC | PRN
  Filled 2014-10-23: qty 10

## 2014-10-23 MED ORDER — HEPARIN SOD (PORK) LOCK FLUSH 100 UNIT/ML IV SOLN
500.0000 [IU] | Freq: Once | INTRAVENOUS | Status: AC | PRN
  Administered 2014-10-23: 500 [IU]
  Filled 2014-10-23: qty 5

## 2014-10-23 MED ORDER — ERGOCALCIFEROL 1.25 MG (50000 UT) PO CAPS: 50000.0000 [IU] | ORAL_CAPSULE | ORAL | Status: DC

## 2014-10-23 MED ORDER — HEPARIN SOD (PORK) LOCK FLUSH 100 UNIT/ML IV SOLN
250.0000 [IU] | Freq: Once | INTRAVENOUS | Status: DC | PRN
  Filled 2014-10-23: qty 5

## 2014-10-23 MED ORDER — SODIUM CHLORIDE 0.9 % IV SOLN
375.0000 mg/m2 | Freq: Once | INTRAVENOUS | Status: AC
  Administered 2014-10-23: 700 mg via INTRAVENOUS
  Filled 2014-10-23: qty 70

## 2014-10-23 MED ORDER — ALTEPLASE 2 MG IJ SOLR
2.0000 mg | Freq: Once | INTRAMUSCULAR | Status: DC | PRN
  Filled 2014-10-23: qty 2

## 2014-10-23 MED ORDER — ACETAMINOPHEN 325 MG PO TABS
ORAL_TABLET | ORAL | Status: AC
  Filled 2014-10-23: qty 2

## 2014-10-23 NOTE — Patient Instructions (Signed)

## 2014-10-23 NOTE — Progress Notes (Signed)
Hematology and Oncology Follow Up Visit  Makayla Fisher [id number] 09/16/1922 81 y.o. 10/23/2014   Principle Diagnosis:   Follicular large cell non-Hodgkin's lymphoma  Current Therapy:    Status post cycle 2 of maintenance Rituxan     Interim History:  Makayla Fisher is back for followup. She seems to be doing pretty well. I think the biggest issue with her right now is that she does not have a primary care doctor. She really needs to have one done. Somehow, she thought that we would do her primary care interventions. I told her that we are not allowed to do that and that we just deal with her lymphoma. I'm sure that the staff at her nursing home will be oh to find a doctor for her.  She's had no cough. There's been no shortness of breath. She has a colostomy which appears reworking fairly well.  She's had no leg swelling. She's had no rashes. There's been no pruritus.  Her appetite has been quite good. She's gaining weight which she is not happy about.  Overall, her performance status is ECOG 1-2.  Medications:  Current outpatient prescriptions:  .  acetaminophen (TYLENOL) 500 MG tablet, Take 500 mg by mouth every 6 (six) hours as needed for mild pain., Disp: , Rfl:  .  amLODipine (NORVASC) 5 MG tablet, Take 5 mg by mouth every morning. , Disp: , Rfl:  .  aspirin EC 81 MG tablet, Take 81 mg by mouth daily., Disp: , Rfl:  .  carvedilol (COREG) 3.125 MG tablet, TAKE (1) TABLET BY MOUTH EACH MORNING., Disp: 30 tablet, Rfl: 5 .  Cholecalciferol (VITAMIN D) 2000 UNITS tablet, Take 2,000 Units by mouth daily., Disp: , Rfl:  .  cycloSPORINE (RESTASIS) 0.05 % ophthalmic emulsion, Place 1 drop into both eyes 2 (two) times daily. , Disp: , Rfl:  .  ezetimibe (ZETIA) 10 MG tablet, Take 10 mg by mouth every morning. , Disp: , Rfl:  .  furosemide (LASIX) 20 MG tablet, Take 20 mg by mouth daily. , Disp: , Rfl:  .  levothyroxine (SYNTHROID, LEVOTHROID) 75 MCG tablet, Take 75 mcg by mouth daily  before breakfast., Disp: , Rfl:  .  lidocaine-prilocaine (EMLA) cream, Apply 1 application topically as needed (for port access)., Disp: , Rfl:  .  lisinopril (PRINIVIL,ZESTRIL) 20 MG tablet, Take 20 mg by mouth 2 (two) times daily., Disp: , Rfl:  .  LORazepam (ATIVAN) 0.5 MG tablet, Take 0.5 mg by mouth daily as needed for anxiety., Disp: , Rfl:  .  Menthol, Topical Analgesic, 1.4 % PTCH, Apply 1 patch topically daily., Disp: , Rfl:  .  Multiple Vitamin (MULTIVITAMIN WITH MINERALS) TABS tablet, Take 1 tablet by mouth daily., Disp: , Rfl:  .  nystatin cream (MYCOSTATIN), Apply 1 application topically daily as needed for dry skin., Disp: , Rfl:  .  potassium chloride SA (K-DUR,KLOR-CON) 20 MEQ tablet, Take 1 tablet (20 mEq total) by mouth daily., Disp: 30 tablet, Rfl: 2 .  rOPINIRole (REQUIP) 1 MG tablet, Take 1 mg by mouth at bedtime., Disp: , Rfl:  .  sertraline (ZOLOFT) 25 MG tablet, Take 25 mg by mouth every morning. , Disp: , Rfl:  .  simvastatin (ZOCOR) 10 MG tablet, Take 10 mg by mouth daily., Disp: , Rfl:  .  Tiotropium Bromide Monohydrate (SPIRIVA RESPIMAT) 2.5 MCG/ACT AERS, Inhale 1 puff into the lungs daily., Disp: , Rfl:  .  vitamin C (ASCORBIC ACID) 500 MG tablet, Take 500  mg by mouth daily., Disp: , Rfl:  .  Vitamin D, Ergocalciferol, (DRISDOL) 50000 UNITS CAPS capsule, Take 50,000 Units by mouth every 7 (seven) days., Disp: , Rfl:  .  ergocalciferol (VITAMIN D2) 50000 UNITS capsule, Take 1 capsule (50,000 Units total) by mouth once a week., Disp: 12 capsule, Rfl: 4 .  famotidine (PEPCID) 20 MG tablet, Take 1 tablet (20 mg total) by mouth 2 (two) times daily. (Patient not taking: Reported on 10/23/2014), Disp: 60 tablet, Rfl: 6 .  ondansetron (ZOFRAN) 8 MG tablet, Take 1 tablet (8 mg total) by mouth 2 (two) times daily. For 4 days following chemo (Patient not taking: Reported on 07/15/2014), Disp: 20 tablet, Rfl: 2 .  polyethylene glycol (MIRALAX / GLYCOLAX) packet, Take 17 g by mouth  daily as needed for mild constipation., Disp: , Rfl:  No current facility-administered medications for this visit.  Facility-Administered Medications Ordered in Other Visits:  .  alteplase (CATHFLO ACTIVASE) injection 2 mg, 2 mg, Intracatheter, Once PRN, [name], MD .  heparin lock flush 100 unit/mL, 500 Units, Intracatheter, Once PRN, [name], MD .  heparin lock flush 100 unit/mL, 250 Units, Intracatheter, Once PRN, [name], MD .  sodium chloride 0.9 % injection 10 mL, 10 mL, Intracatheter, PRN, [name], MD .  sodium chloride 0.9 % injection 3 mL, 3 mL, Intravenous, PRN, [name], MD  Allergies:  Allergies  Allergen Reactions  . Epipen [Epinephrine Hcl]     Heart palpitations  . Heparin     Unsure of side affects (maybe affected platelets per pt)  . Levaquin [Levofloxacin In D5w] Other (See Comments)    Weakness  . Chlorhexidine Gluconate Other (See Comments)    Pt declined use and suspects she may have an intolerance,   . Sulfa Antibiotics Rash    Past Medical History, Surgical history, Social history, and Family History were reviewed and updated.  Review of Systems: As above  Physical Exam:  height is 5\' 2"  (1.575 m) and weight is 166 lb (75.297 kg). Her oral temperature is 97.3 F (36.3 C). Her blood pressure is 152/64 and her pulse is 64. Her respiration is 16.   Elderly white female. Head and neck exam shows no ocular or oral lesions. There are no palpable cervical or supraclavicular lymph nodes. Lungs are clear bilaterally. Cardiac exam regular rate and rhythm with no murmurs rubs or bruits. Abdomen is soft. She has good bowel sounds. The ostomy is intact. There is no stool in the ostomy bag. There is no palpable liver or spleen tip. Her laparotomy scars are well-healed. Her colostomy is in the left lower quadrant. Back exam shows no tenderness over the spine ribs or hips. Extremities shows no clubbing cyanosis or edema. Skin exam no  rashes, ecchymoses or petechia. She does have some areas of Candida in the skin folds of her abdomen neurological exam is nonfocal..   Lab Results  Component Value Date   WBC 3.0* 10/23/2014   HGB 11.7 10/23/2014   HCT 34.7* 10/23/2014   MCV 99 10/23/2014   PLT 118* 10/23/2014     Chemistry      Component Value Date/Time   NA 139 10/23/2014 0911   NA 141 06/11/2014 1052   NA 141 05/08/2014 1041   K 4.0 10/23/2014 0911   K 4.9 06/11/2014 1052   K 4.5 05/08/2014 1041   CL 104 10/23/2014 0911   CL 110 06/11/2014 1052   CO2 26  10/23/2014 0911   CO2 25 06/11/2014 1052   CO2 24 05/08/2014 1041   BUN 12 10/23/2014 0911   BUN 14 06/11/2014 1052   BUN 14.2 05/08/2014 1041   CREATININE 0.9 10/23/2014 0911   CREATININE 0.77 06/11/2014 1052   CREATININE 0.8 05/08/2014 1041      Component Value Date/Time   CALCIUM 9.6 10/23/2014 0911   CALCIUM 9.5 06/11/2014 1052   CALCIUM 9.2 05/08/2014 1041   ALKPHOS 53 10/23/2014 0911   ALKPHOS 66 05/08/2014 1041   ALKPHOS 90 09/25/2013 1141   AST 24 10/23/2014 0911   AST 23 05/08/2014 1041   AST 15 09/25/2013 1141   ALT 12 10/23/2014 0911   ALT 10 05/08/2014 1041   ALT <8 09/25/2013 1141   BILITOT 0.60 10/23/2014 0911   BILITOT 0.42 05/08/2014 1041   BILITOT 0.4 09/25/2013 1141         Impression and Plan: Makayla Fisher is 81 year old female. She has a follicular large cell lymphoma.   I will continue with the Rituxan.  I am happy that her hemoglobin is better. We gave her iron last time which really helped.  Again, I told her that she needs to have a primary care doctor. Again, her nursing home should be able to help with this.  I do think she needs vitamin D. She is not on any vitamin D. I wrote a prescription for 50,000 units a week.  We will plan to get her back here in 3 months. [name], MD 6/30/201611:37 AM

## 2014-10-28 LAB — RETICULOCYTES (CHCC)
ABS RETIC: 29 10*3/uL (ref 19.0–186.0)
RBC.: 3.62 MIL/uL — AB (ref 3.87–5.11)
RETIC CT PCT: 0.8 % (ref 0.4–2.3)

## 2014-10-28 LAB — ERYTHROPOIETIN: ERYTHROPOIETIN: 25.3 m[IU]/mL — AB (ref 2.6–18.5)

## 2014-12-18 IMAGING — CT CT HEAD W/O CM
1 series · 16 of 30 positions shown, 20 images · non-contrast
Comparison: None.

CLINICAL DATA: Headache

EXAM:
CT HEAD WITHOUT CONTRAST
TECHNIQUE: Contiguous axial images were obtained from the base of the skull
through the vertex without intravenous contrast.

[Series 2: head 5.0 h31s · axial · 0.48mm/px · z∈[-143,+17]mm · 16 of 36 slices shown, 20 images]
[im 2/36  brain]
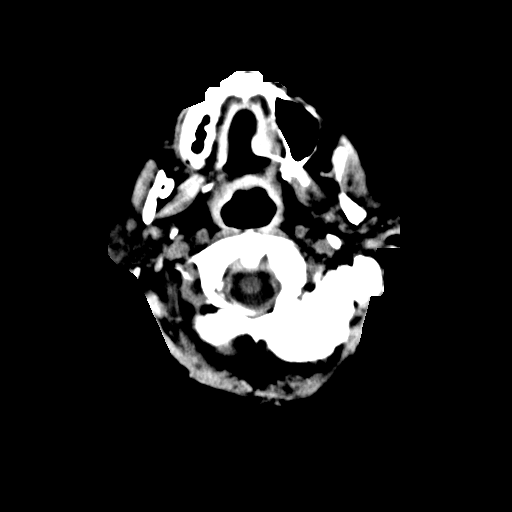
[im 2/36  bone]
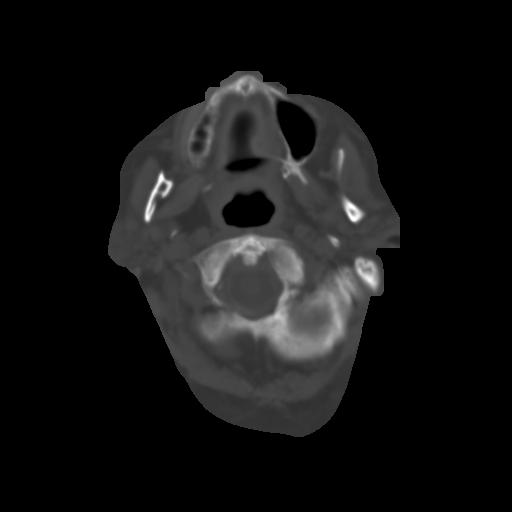
[im 4/36  brain]
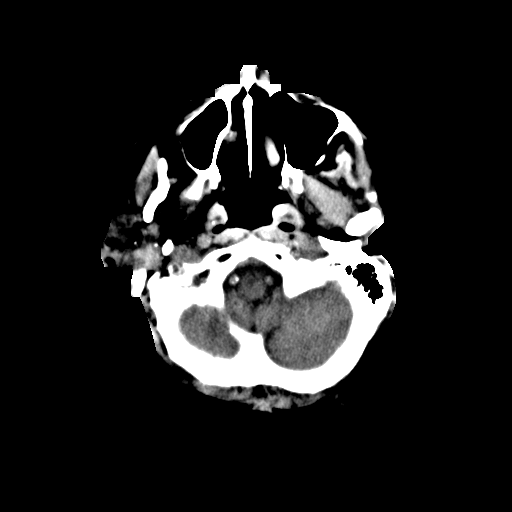
[im 7/36  brain]
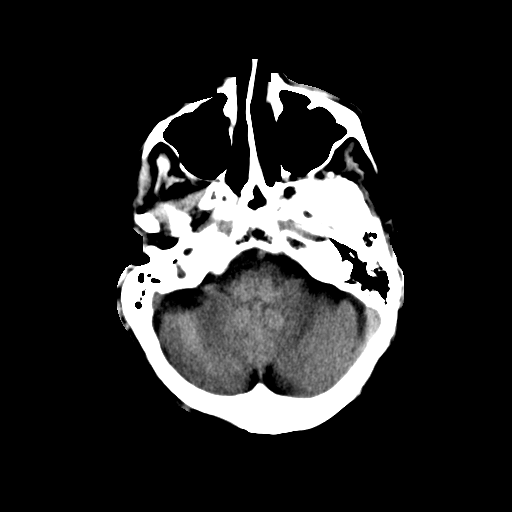
[im 9/36  brain]
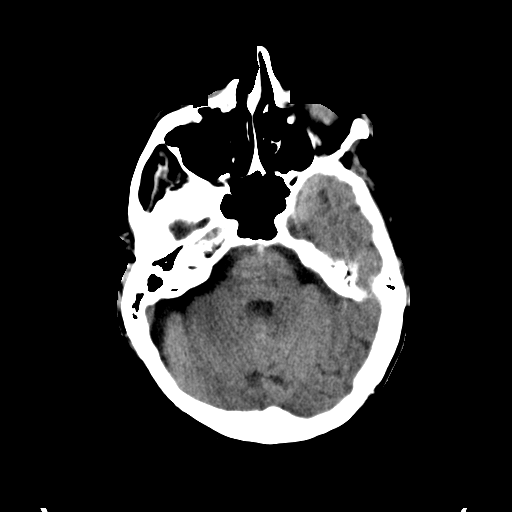
[im 10/36  brain]
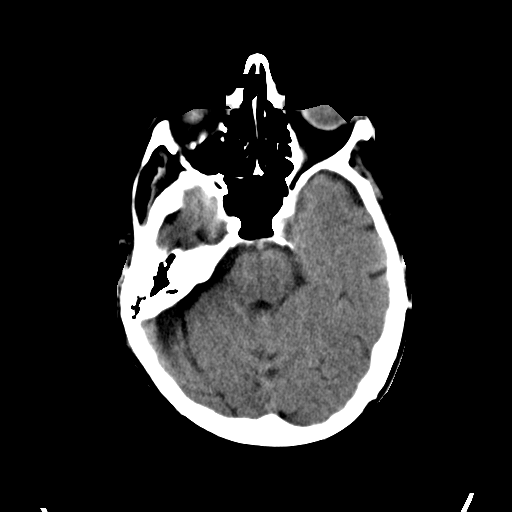
[im 10/36  bone]
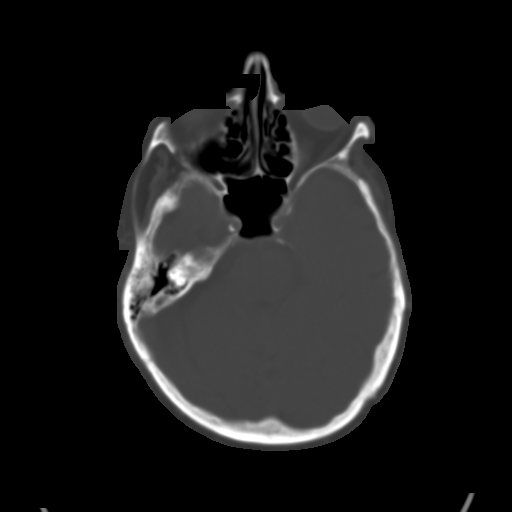
[im 13/36  brain]
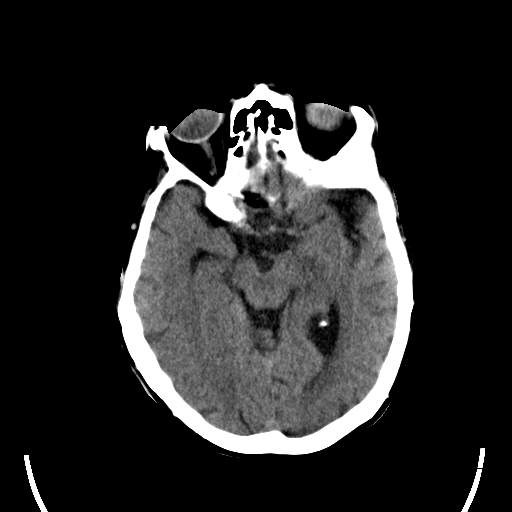
[im 15/36  brain]
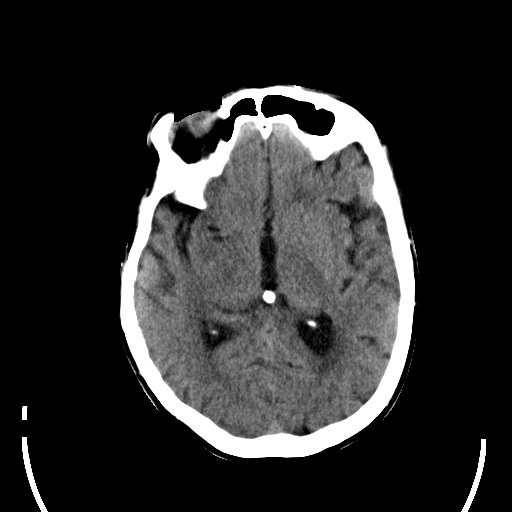
[im 17/36  brain]
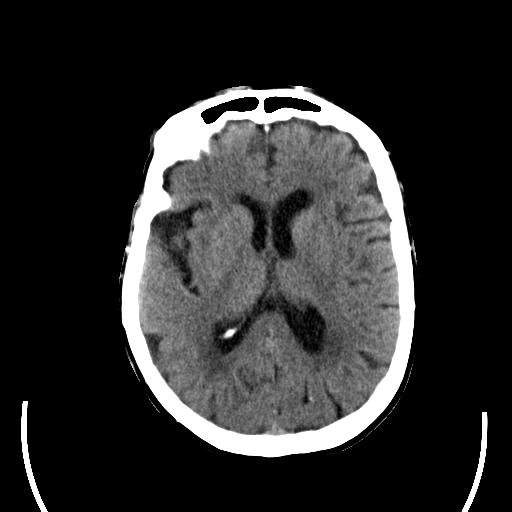
[im 19/36  brain]
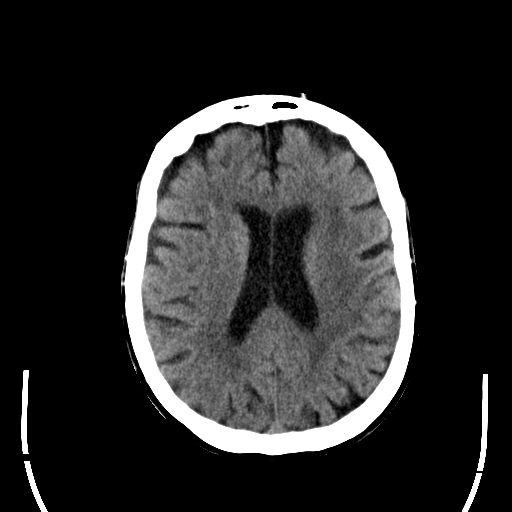
[im 19/36  bone]
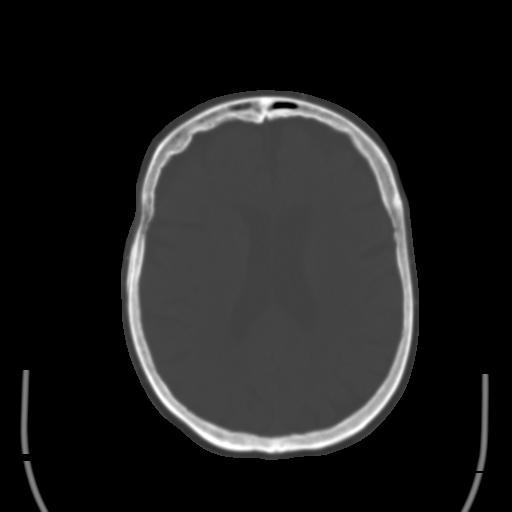
[im 21/36  brain]
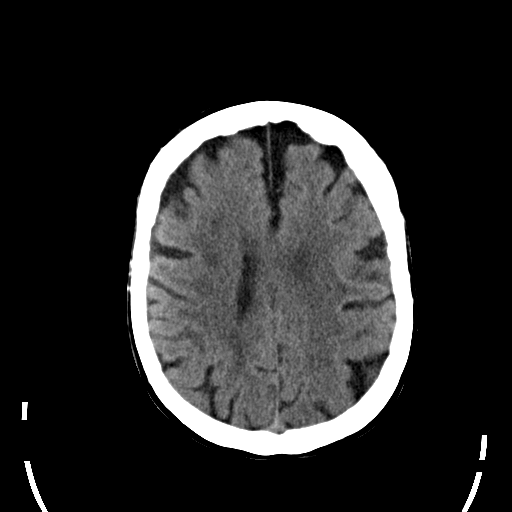
[im 23/36  brain]
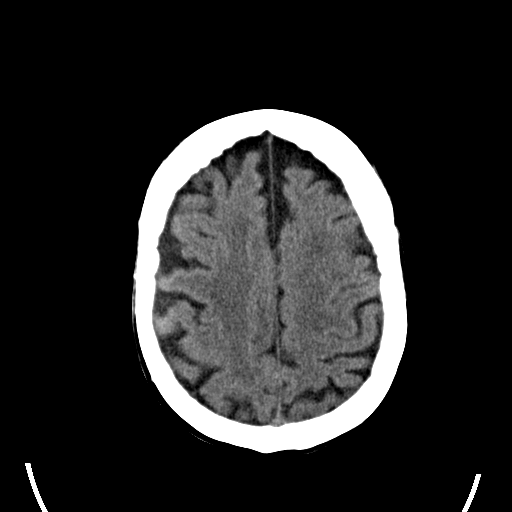
[im 26/36  brain]
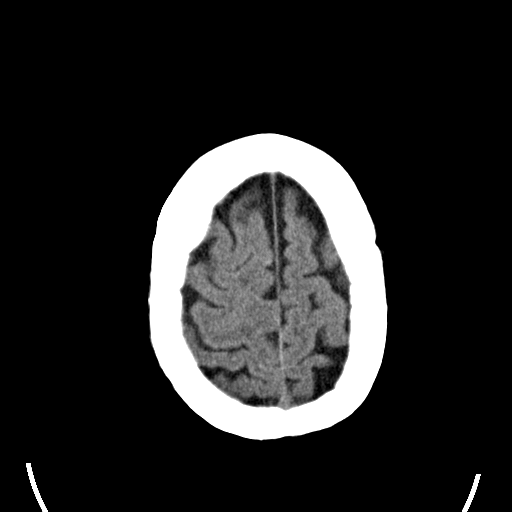
[im 27/36  brain]
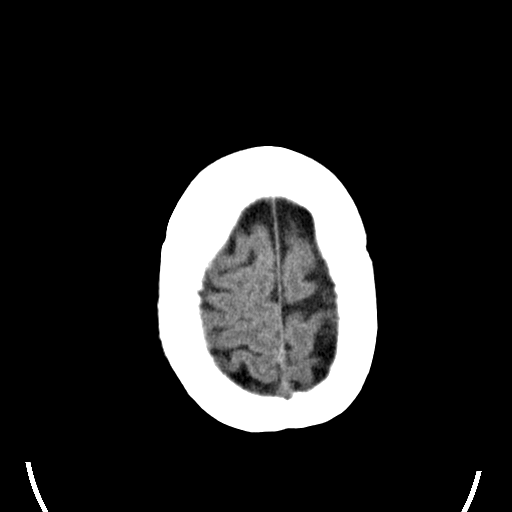
[im 27/36  bone]
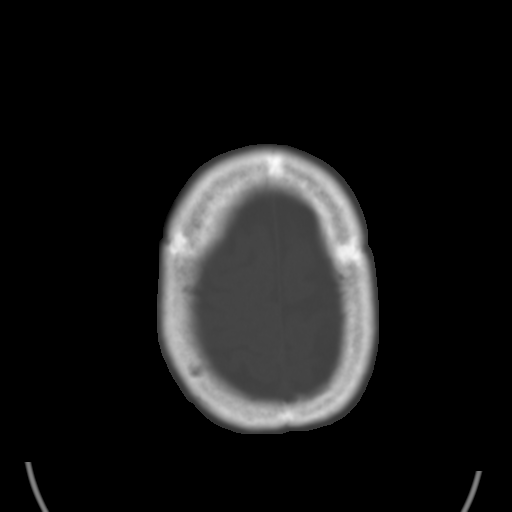
[im 29/36  brain]
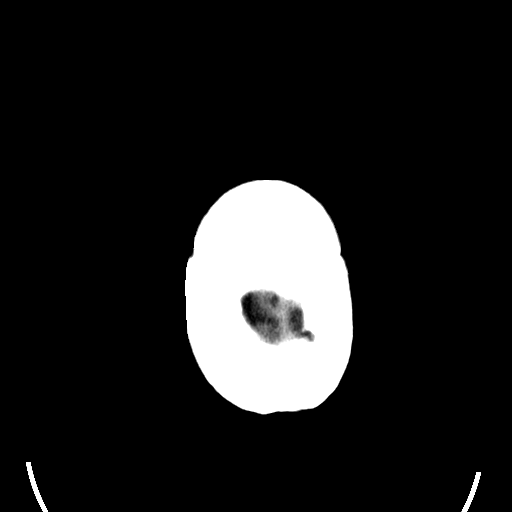
[im 32/36  brain]
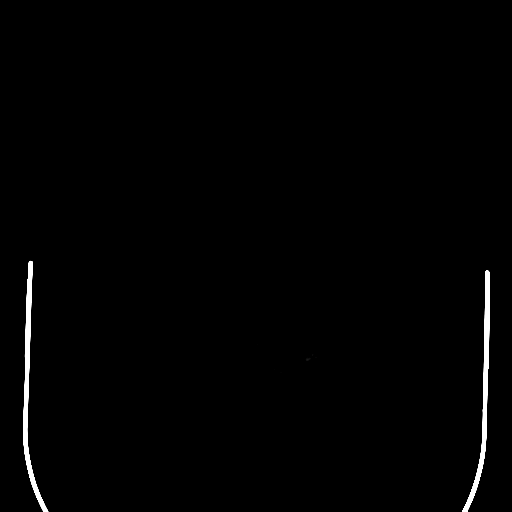
[im 34/36  brain]
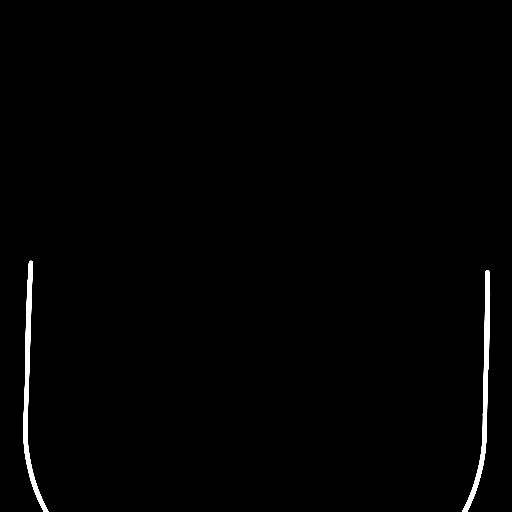

[16 of 30 positions shown; findings below may reference images not displayed]

FINDINGS: No skull fracture is noted. Paranasal sinuses and mastoid air cells
are unremarkable. No intracranial hemorrhage, mass effect or midline
shift. Mild cerebral atrophy. Periventricular and patchy subcortical
white matter decreased attenuation is probable due to chronic small
vessel ischemic changes. No acute cortical infarction. No mass
lesion is noted on this unenhanced scan. Atherosclerotic
calcifications of carotid siphon.
IMPRESSION: No acute intracranial abnormality. Cerebral atrophy. Periventricular
and patchy subcortical white matter decreased attenuation probable
due to chronic small vessel ischemic changes.

## 2015-01-08 ENCOUNTER — Other Ambulatory Visit: Payer: Self-pay

## 2015-01-08 DIAGNOSIS — Z1231 Encounter for screening mammogram for malignant neoplasm of breast: Secondary | ICD-10-CM

## 2015-01-22 ENCOUNTER — Ambulatory Visit (HOSPITAL_BASED_OUTPATIENT_CLINIC_OR_DEPARTMENT_OTHER): Payer: Medicare Other

## 2015-01-22 ENCOUNTER — Ambulatory Visit (HOSPITAL_BASED_OUTPATIENT_CLINIC_OR_DEPARTMENT_OTHER): Payer: Medicare Other | Admitting: Hematology & Oncology

## 2015-01-22 ENCOUNTER — Encounter: Payer: Self-pay | Admitting: Hematology & Oncology

## 2015-01-22 ENCOUNTER — Other Ambulatory Visit: Payer: Self-pay | Admitting: *Deleted

## 2015-01-22 ENCOUNTER — Other Ambulatory Visit (HOSPITAL_BASED_OUTPATIENT_CLINIC_OR_DEPARTMENT_OTHER): Payer: Medicare Other

## 2015-01-22 VITALS — BP 134/79 | HR 47 | Temp 97.4°F | Resp 16 | Ht 62.0 in | Wt 166.0 lb

## 2015-01-22 VITALS — BP 122/56 | HR 52 | Temp 97.2°F | Resp 18

## 2015-01-22 DIAGNOSIS — C8590 Non-Hodgkin lymphoma, unspecified, unspecified site: Secondary | ICD-10-CM

## 2015-01-22 DIAGNOSIS — Z5112 Encounter for antineoplastic immunotherapy: Secondary | ICD-10-CM

## 2015-01-22 DIAGNOSIS — D509 Iron deficiency anemia, unspecified: Secondary | ICD-10-CM | POA: Diagnosis not present

## 2015-01-22 DIAGNOSIS — Z23 Encounter for immunization: Secondary | ICD-10-CM

## 2015-01-22 DIAGNOSIS — E559 Vitamin D deficiency, unspecified: Secondary | ICD-10-CM | POA: Diagnosis not present

## 2015-01-22 DIAGNOSIS — D500 Iron deficiency anemia secondary to blood loss (chronic): Secondary | ICD-10-CM

## 2015-01-22 DIAGNOSIS — K9403 Colostomy malfunction: Secondary | ICD-10-CM

## 2015-01-22 DIAGNOSIS — K56699 Other intestinal obstruction unspecified as to partial versus complete obstruction: Secondary | ICD-10-CM

## 2015-01-22 LAB — CBC WITH DIFFERENTIAL (CANCER CENTER ONLY)
BASO#: 0.2 10*3/uL (ref 0.0–0.2)
BASO%: 4.3 % — ABNORMAL HIGH (ref 0.0–2.0)
EOS%: 3.3 % (ref 0.0–7.0)
Eosinophils Absolute: 0.1 10*3/uL (ref 0.0–0.5)
HCT: 32.6 % — ABNORMAL LOW (ref 34.8–46.6)
HEMOGLOBIN: 10.6 g/dL — AB (ref 11.6–15.9)
LYMPH#: 0.8 10*3/uL — ABNORMAL LOW (ref 0.9–3.3)
LYMPH%: 21.5 % (ref 14.0–48.0)
MCH: 32.7 pg (ref 26.0–34.0)
MCHC: 32.5 g/dL (ref 32.0–36.0)
MCV: 101 fL (ref 81–101)
MONO#: 0.8 10*3/uL (ref 0.1–0.9)
MONO%: 19.7 % — AB (ref 0.0–13.0)
NEUT%: 51.2 % (ref 39.6–80.0)
NEUTROS ABS: 2 10*3/uL (ref 1.5–6.5)
PLATELETS: 110 10*3/uL — AB (ref 145–400)
RBC: 3.24 10*6/uL — AB (ref 3.70–5.32)
RDW: 13.4 % (ref 11.1–15.7)
WBC: 3.9 10*3/uL (ref 3.9–10.0)

## 2015-01-22 LAB — TECHNOLOGIST REVIEW CHCC SATELLITE

## 2015-01-22 LAB — CMP (CANCER CENTER ONLY)
ALBUMIN: 3.6 g/dL (ref 3.3–5.5)
ALT(SGPT): 18 U/L (ref 10–47)
AST: 27 U/L (ref 11–38)
Alkaline Phosphatase: 50 U/L (ref 26–84)
BILIRUBIN TOTAL: 0.5 mg/dL (ref 0.20–1.60)
BUN: 20 mg/dL (ref 7–22)
CO2: 23 meq/L (ref 18–33)
CREATININE: 0.6 mg/dL (ref 0.6–1.2)
Calcium: 9 mg/dL (ref 8.0–10.3)
Chloride: 110 mEq/L — ABNORMAL HIGH (ref 98–108)
Glucose, Bld: 102 mg/dL (ref 73–118)
Potassium: 4 mEq/L (ref 3.3–4.7)
SODIUM: 138 meq/L (ref 128–145)
Total Protein: 6 g/dL — ABNORMAL LOW (ref 6.4–8.1)

## 2015-01-22 MED ORDER — ACETAMINOPHEN 325 MG PO TABS
ORAL_TABLET | ORAL | Status: AC
Start: 2015-01-22 — End: 2015-01-22
  Filled 2015-01-22: qty 2

## 2015-01-22 MED ORDER — SODIUM CHLORIDE 0.9 % IJ SOLN
10.0000 mL | INTRAMUSCULAR | Status: DC | PRN
  Administered 2015-01-22: 10 mL
  Filled 2015-01-22: qty 10

## 2015-01-22 MED ORDER — SODIUM CHLORIDE 0.9 % IV SOLN
375.0000 mg/m2 | Freq: Once | INTRAVENOUS | Status: AC
  Administered 2015-01-22: 700 mg via INTRAVENOUS
  Filled 2015-01-22: qty 60

## 2015-01-22 MED ORDER — HEPARIN SOD (PORK) LOCK FLUSH 100 UNIT/ML IV SOLN
500.0000 [IU] | Freq: Once | INTRAVENOUS | Status: AC | PRN
  Administered 2015-01-22: 500 [IU]
  Filled 2015-01-22: qty 5

## 2015-01-22 MED ORDER — PNEUMOCOCCAL VAC POLYVALENT 25 MCG/0.5ML IJ INJ
0.5000 mL | INJECTION | INTRAMUSCULAR | Status: AC
  Administered 2015-01-22: 0.5 mL via INTRAMUSCULAR
  Filled 2015-01-22: qty 0.5

## 2015-01-22 MED ORDER — SODIUM CHLORIDE 0.9 % IV SOLN
510.0000 mg | Freq: Once | INTRAVENOUS | Status: AC
  Administered 2015-01-22: 510 mg via INTRAVENOUS
  Filled 2015-01-22: qty 17

## 2015-01-22 MED ORDER — DIPHENHYDRAMINE HCL 25 MG PO CAPS
50.0000 mg | ORAL_CAPSULE | Freq: Once | ORAL | Status: AC
  Administered 2015-01-22: 50 mg via ORAL

## 2015-01-22 MED ORDER — ACETAMINOPHEN 325 MG PO TABS
650.0000 mg | ORAL_TABLET | Freq: Once | ORAL | Status: AC
  Administered 2015-01-22: 650 mg via ORAL

## 2015-01-22 MED ORDER — DIPHENHYDRAMINE HCL 25 MG PO CAPS
ORAL_CAPSULE | ORAL | Status: AC
  Filled 2015-01-22: qty 2

## 2015-01-22 MED ORDER — SODIUM CHLORIDE 0.9 % IV SOLN
Freq: Once | INTRAVENOUS | Status: AC
  Administered 2015-01-22: 11:00:00 via INTRAVENOUS

## 2015-01-22 NOTE — Progress Notes (Signed)
Hematology and Oncology Follow Up Visit  Makayla Fisher [id number] 1922/08/30 81 y.o. 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  Makayla Fisher is back for followup. She does feel quite tired. I suspect that her iron is probably low again. We did go ahead and check iron studies on her last time. Her ferritin was only 18. Her iron saturation was 13%. As such, we will go ahead and give her some iron today.  Otherwise, she is doing pretty well. She's had no fever. She's had no problems with her colostomy. She's had no cough. She's had no leg swelling. Per she did have a little bit of a fall a couple weeks ago. She has some soreness in the posterior mid right rib cage.  Her appetite has been pretty good. There's been no nausea or vomiting.   Overall, her performance status is ECOG 1-2.  Medications:  Current outpatient prescriptions:  .  acetaminophen (TYLENOL) 500 MG tablet, Take 500 mg by mouth every 6 (six) hours as needed for mild pain., Disp: , Rfl:  .  amLODipine (NORVASC) 5 MG tablet, Take 5 mg by mouth every morning. , Disp: , Rfl:  .  aspirin EC 81 MG tablet, Take 81 mg by mouth daily., Disp: , Rfl:  .  carvedilol (COREG) 3.125 MG tablet, TAKE (1) TABLET BY MOUTH EACH MORNING., Disp: 30 tablet, Rfl: 5 .  Cholecalciferol (VITAMIN D) 2000 UNITS tablet, Take 2,000 Units by mouth daily., Disp: , Rfl:  .  cycloSPORINE (RESTASIS) 0.05 % ophthalmic emulsion, Place 1 drop into both eyes 2 (two) times daily. , Disp: , Rfl:  .  ergocalciferol (VITAMIN D2) 50000 UNITS capsule, Take 1 capsule (50,000 Units total) by mouth once a week., Disp: 12 capsule, Rfl: 4 .  famotidine (PEPCID) 20 MG tablet, Take 1 tablet (20 mg total) by mouth 2 (two) times daily., Disp: 60 tablet, Rfl: 6 .   furosemide (LASIX) 20 MG tablet, Take 20 mg by mouth daily. , Disp: , Rfl:  .  levothyroxine (SYNTHROID, LEVOTHROID) 75 MCG tablet, Take 75 mcg by mouth daily before breakfast., Disp: , Rfl:  .  lidocaine-prilocaine (EMLA) cream, Apply 1 application topically as needed (for port access)., Disp: , Rfl:  .  lisinopril (PRINIVIL,ZESTRIL) 20 MG tablet, Take 20 mg by mouth 2 (two) times daily., Disp: , Rfl:  .  LORazepam (ATIVAN) 0.5 MG tablet, Take 0.5 mg by mouth daily as needed for anxiety., Disp: , Rfl:  .  Menthol, Topical Analgesic, 1.4 % PTCH, Apply 1 patch topically daily., Disp: , Rfl:  .  Multiple Vitamin (MULTIVITAMIN WITH MINERALS) TABS tablet, Take 1 tablet by mouth daily., Disp: , Rfl:  .  nystatin cream (MYCOSTATIN), Apply 1 application topically daily as needed for dry skin., Disp: , Rfl:  .  ondansetron (ZOFRAN) 8 MG tablet, Take 1 tablet (8 mg total) by mouth 2 (two) times daily. For 4 days following chemo, Disp: 20 tablet, Rfl: 2 .  PATADAY 0.2 % SOLN, , Disp: , Rfl:  .  polyethylene glycol (MIRALAX / GLYCOLAX) packet, Take 17 g by mouth daily as needed for mild constipation., Disp: , Rfl:  .  potassium chloride SA (K-DUR,KLOR-CON) 20 MEQ tablet, Take 1 tablet (20 mEq total) by mouth daily., Disp: 30 tablet, Rfl: 2 .  prochlorperazine (  COMPAZINE) 10 MG tablet, , Disp: , Rfl:  .  rOPINIRole (REQUIP) 1 MG tablet, Take 1 mg by mouth at bedtime., Disp: , Rfl:  .  sertraline (ZOLOFT) 25 MG tablet, Take 25 mg by mouth every morning. , Disp: , Rfl:  .  simvastatin (ZOCOR) 10 MG tablet, Take 10 mg by mouth daily., Disp: , Rfl:  .  Tiotropium Bromide Monohydrate (SPIRIVA RESPIMAT) 2.5 MCG/ACT AERS, Inhale 1 puff into the lungs daily., Disp: , Rfl:  .  vitamin C (ASCORBIC ACID) 500 MG tablet, Take 500 mg by mouth daily., Disp: , Rfl:  .  Vitamin D, Ergocalciferol, (DRISDOL) 50000 UNITS CAPS capsule, Take 50,000 Units by mouth every 7 (seven) days., Disp: , Rfl:  .  ezetimibe (ZETIA) 10 MG  tablet, Take 10 mg by mouth every morning. , Disp: , Rfl:   Allergies:  Allergies  Allergen Reactions  . Epipen [Epinephrine Hcl]     Heart palpitations  . Heparin     Unsure of side affects (maybe affected platelets per pt)  . Levaquin [Levofloxacin In D5w] Other (See Comments)    Weakness  . Chlorhexidine Gluconate Other (See Comments)    Pt declined use and suspects she may have an intolerance,   . Sulfa Antibiotics Rash    Past Medical History, Surgical history, Social history, and Family History were reviewed and updated.  Review of Systems: As above  Physical Exam:  height is 5\' 2"  (1.575 m) and weight is 166 lb (75.297 kg). Her oral temperature is 97.4 F (36.3 C). Her blood pressure is 134/79 and her pulse is 47. Her respiration is 16.   Elderly white female. Head and neck exam shows no ocular or oral lesions. There are no palpable cervical or supraclavicular lymph nodes. Lungs are clear bilaterally. Cardiac exam regular rate and rhythm with no murmurs rubs or bruits. Abdomen is soft. She has good bowel sounds. The ostomy is intact. There is no stool in the ostomy bag. There is no palpable liver or spleen tip. Her laparotomy scars are well-healed. Her colostomy is in the left lower quadrant. Back exam shows no tenderness over the spine ribs or hips. Extremities shows no clubbing cyanosis or edema. Skin exam no rashes, ecchymoses or petechia. She does have some areas of Candida in the skin folds of her abdomen neurological exam is nonfocal..   Lab Results  Component Value Date   WBC 3.9 01/22/2015   HGB 10.6* 01/22/2015   HCT 32.6* 01/22/2015   MCV 101 01/22/2015   PLT 110* 01/22/2015     Chemistry      Component Value Date/Time   NA 138 01/22/2015 0933   NA 141 06/11/2014 1052   NA 141 05/08/2014 1041   K 4.0 01/22/2015 0933   K 4.9 06/11/2014 1052   K 4.5 05/08/2014 1041   CL 110* 01/22/2015 0933   CL 110 06/11/2014 1052   CO2 23 01/22/2015 0933   CO2 25  06/11/2014 1052   CO2 24 05/08/2014 1041   BUN 20 01/22/2015 0933   BUN 14 06/11/2014 1052   BUN 14.2 05/08/2014 1041   CREATININE 0.6 01/22/2015 0933   CREATININE 0.77 06/11/2014 1052   CREATININE 0.8 05/08/2014 1041      Component Value Date/Time   CALCIUM 9.0 01/22/2015 0933   CALCIUM 9.5 06/11/2014 1052   CALCIUM 9.2 05/08/2014 1041   ALKPHOS 50 01/22/2015 0933   ALKPHOS 66 05/08/2014 1041   ALKPHOS 90 09/25/2013 1141  AST 27 01/22/2015 0933   AST 23 05/08/2014 1041   AST 15 09/25/2013 1141   ALT 18 01/22/2015 0933   ALT 10 05/08/2014 1041   ALT <8 09/25/2013 1141   BILITOT 0.50 01/22/2015 0933   BILITOT 0.42 05/08/2014 1041   BILITOT 0.4 09/25/2013 1141         Impression and Plan: Ms. Rabadi is 81 year old female. She has a follicular large cell lymphoma.   I will continue with the Rituxan.  I am sure that the [facility] that we give her will make her feel a little bit better. It worked last time we gave to her. I think she just has very poor absorption of iron.  We will plan to get her back here in 3 months.   [name], MD 9/29/201611:04 AM

## 2015-01-22 NOTE — Patient Instructions (Addendum)
[facility] Discharge Instructions for Patients Receiving Chemotherapy  Today you received the following chemotherapy agents Rituxan.   To help prevent nausea and vomiting after your treatment, we encourage you to take your nausea medication as prescribed.    If you develop nausea and vomiting that is not controlled by your nausea medication, call the clinic.   BELOW ARE SYMPTOMS THAT SHOULD BE REPORTED IMMEDIATELY:  *FEVER GREATER THAN 100.5 F  *CHILLS WITH OR WITHOUT FEVER  NAUSEA AND VOMITING THAT IS NOT CONTROLLED WITH YOUR NAUSEA MEDICATION  *UNUSUAL SHORTNESS OF BREATH  *UNUSUAL BRUISING OR BLEEDING  TENDERNESS IN MOUTH AND THROAT WITH OR WITHOUT PRESENCE OF ULCERS  *URINARY PROBLEMS  *BOWEL PROBLEMS  UNUSUAL RASH Items with * indicate a potential emergency and should be followed up as soon as possible.  Feel free to call the clinic you have any questions or concerns. The clinic phone number is [phone].  Please show the [facility] at check-in to the Emergency Department and triage nurse.  Pneumococcal Vaccine, Polyvalent solution for injection What is this medicine? PNEUMOCOCCAL VACCINE, POLYVALENT (NEU mo KOK al vak SEEN, pol ee VEY luhnt) is a vaccine to prevent pneumococcus bacteria infection. These bacteria are a major cause of ear infections, Strep throat infections, and serious pneumonia, meningitis, or blood infections worldwide. These vaccines help the body to produce antibodies (protective substances) that help your body defend against these bacteria. This vaccine is recommended for people 49 years of age and older with health problems. It is also recommended for all adults over 39 years old. This vaccine will not treat an infection. This medicine may be used for other purposes; ask your health care provider or pharmacist if you have questions. COMMON BRAND NAME(S): Pneumovax 23 What should I tell my health care provider before I take  this medicine? They need to know if you have any of these conditions: -bleeding problems -bone marrow or organ transplant -cancer, Hodgkin's disease -fever -infection -immune system problems -low platelet count in the blood -seizures -an unusual or allergic reaction to pneumococcal vaccine, diphtheria toxoid, other vaccines, latex, other medicines, foods, dyes, or preservatives -pregnant or trying to get pregnant -breast-feeding How should I use this medicine? This vaccine is for injection into a muscle or under the skin. It is given by a health care professional. A copy of Vaccine Information Statements will be given before each vaccination. Read this sheet carefully each time. The sheet may change frequently. Talk to your pediatrician regarding the use of this medicine in children. While this drug may be prescribed for children as young as 31 years of age for selected conditions, precautions do apply. Overdosage: If you think you have taken too much of this medicine contact a poison control center or emergency room at once. NOTE: This medicine is only for you. Do not share this medicine with others. What if I miss a dose? It is important not to miss your dose. Call your doctor or health care professional if you are unable to keep an appointment. What may interact with this medicine? -medicines for cancer chemotherapy -medicines that suppress your immune function -medicines that treat or prevent blood clots like warfarin, enoxaparin, and dalteparin -steroid medicines like prednisone or cortisone This list may not describe all possible interactions. Give your health care provider a list of all the medicines, herbs, non-prescription drugs, or dietary supplements you use. Also tell them if you smoke, drink alcohol, or use illegal drugs. Some items may  interact with your medicine. What should I watch for while using this medicine? Mild fever and pain should go away in 3 days or less. Report  any unusual symptoms to your doctor or health care professional. What side effects may I notice from receiving this medicine? Side effects that you should report to your doctor or health care professional as soon as possible: -allergic reactions like skin rash, itching or hives, swelling of the face, lips, or tongue -breathing problems -confused -fever over 102 degrees F -pain, tingling, numbness in the hands or feet -seizures -unusual bleeding or bruising -unusual muscle weakness Side effects that usually do not require medical attention (report to your doctor or health care professional if they continue or are bothersome): -aches and pains -diarrhea -fever of 102 degrees F or less -headache -irritable -loss of appetite -pain, tender at site where injected -trouble sleeping This list may not describe all possible side effects. Call your doctor for medical advice about side effects. You may report side effects to FDA at 1-800-FDA-1088. Where should I keep my medicine? This does not apply. This vaccine is given in a clinic, pharmacy, doctor's office, or other health care setting and will not be stored at home. NOTE: This sheet is a summary. It may not cover all possible information. If you have questions about this medicine, talk to your doctor, pharmacist, or health care provider.  2015, Elsevier/Gold Standard. (2007-11-16 14:32:37)

## 2015-01-23 ENCOUNTER — Other Ambulatory Visit: Payer: Self-pay | Admitting: *Deleted

## 2015-01-23 ENCOUNTER — Telehealth: Payer: Self-pay | Admitting: *Deleted

## 2015-01-23 DIAGNOSIS — C8590 Non-Hodgkin lymphoma, unspecified, unspecified site: Secondary | ICD-10-CM

## 2015-01-23 LAB — VITAMIN D 25 HYDROXY (VIT D DEFICIENCY, FRACTURES): VIT D 25 HYDROXY: 43 ng/mL (ref 30–100)

## 2015-01-23 NOTE — Telephone Encounter (Addendum)
Message left on personal voice mail  ----- Message from [name], MD sent at 01/23/2015  7:15 AM EDT ----- Call - vit D level is great!!!  pete

## 2015-02-09 NOTE — Progress Notes (Signed)
Patient ID: Makayla Fisher, female   DOB: 11-Nov-1922, 81 y.o.   MRN: [id number]    81 y.o.  Referred on 12/15  for preop clearance Has follicular large cell lymphoma of abdomen.  Ongoing Rx with chemo by Dr Marin Olp.  Has ostomy and wants to have It reversed  It is not external , leaks stool and gets irritated often  Colostomy done 2014 She underwent an open right colectomy and sigmoid colectomy with ileotransverse anastomosis and end descending colostomy with Hartmann's pouch. She had what sounds like a very long protracted post operative course requiring mechanical ventilation.  It also appears that she went into acute renal failure. Surgery was done emergently due to diverticular perforation with stricture.  PET scan 8/15 showed near total resolution of cancer in abdomen and repeat scan planned for January  Distant history of CAD  With stent /CABG in [facility]  Records not available in [place]  Echo done 3/15 for chemo in [place] showed normal EF , abnormal relaxation and mild MR  Reviewed  04/09/14 carotid duplex reviewed  85-46% LICA stenosis.  F/U duplex in one year 12/16  2/18  Had colostomy revision for stricture with slow recovery but no cardiac complications by [name]  Note:  Patient has a song to sing for me next visit  ROS: Denies fever, malais, weight loss, blurry vision, decreased visual acuity, cough, sputum, SOB, hemoptysis, pleuritic pain, palpitaitons, heartburn, abdominal pain, melena, lower extremity edema, claudication, or rash.  All other systems reviewed and negative   General: Affect appropriate Elderly spry female  HEENT: normal Neck supple with no adenopathy JVP normal no bruits no thyromegaly Lungs clear with no wheezing and good diaphragmatic motion Heart:  S1/S2 SEM  murmur,rub, gallop or click previous sternotomy PMI normal Abdomen: benighn, BS positve, no tenderness, no AAA ostomy with bag LLQ no bruit.  No HSM or  HJR Distal pulses intact with no bruits No edema Neuro non-focal Skin warm and dry No muscular weakness  Medications Current Outpatient Prescriptions  Medication Sig Dispense Refill  . acetaminophen (TYLENOL) 500 MG tablet Take 500 mg by mouth every 6 (six) hours as needed for mild pain.    Marland Kitchen amLODipine (NORVASC) 5 MG tablet Take 5 mg by mouth every morning.     Marland Kitchen aspirin EC 81 MG tablet Take 81 mg by mouth daily.    . carvedilol (COREG) 3.125 MG tablet TAKE (1) TABLET BY MOUTH EACH MORNING. 30 tablet 5  . cycloSPORINE (RESTASIS) 0.05 % ophthalmic emulsion Place 1 drop into both eyes 2 (two) times daily.     . ergocalciferol (VITAMIN D2) 50000 UNITS capsule Take 1 capsule (50,000 Units total) by mouth once a week. 12 capsule 4  . ezetimibe (ZETIA) 10 MG tablet Take 10 mg by mouth every morning.     . famotidine (PEPCID) 20 MG tablet Take 1 tablet (20 mg total) by mouth 2 (two) times daily. 60 tablet 6  . furosemide (LASIX) 20 MG tablet Take 20 mg by mouth daily.     Marland Kitchen levothyroxine (SYNTHROID, LEVOTHROID) 75 MCG tablet Take 75 mcg by mouth daily before breakfast.    . lidocaine-prilocaine (EMLA) cream Apply 1 application topically as needed (for port access).    Marland Kitchen lisinopril (PRINIVIL,ZESTRIL) 20 MG tablet Take 20 mg by mouth 2 (two) times daily.    Marland Kitchen LORazepam (ATIVAN) 0.5 MG tablet Take 0.5 mg by mouth daily as needed for anxiety.    Marland Kitchen  Menthol, Topical Analgesic, 1.4 % PTCH Apply 1 patch topically daily.    . Multiple Vitamin (MULTIVITAMIN WITH MINERALS) TABS tablet Take 1 tablet by mouth daily.    Marland Kitchen nystatin cream (MYCOSTATIN) Apply 1 application topically daily as needed for dry skin.    Marland Kitchen ondansetron (ZOFRAN) 8 MG tablet Take 1 tablet (8 mg total) by mouth 2 (two) times daily. For 4 days following chemo 20 tablet 2  . PATADAY 0.2 % SOLN Place 1 drop into both eyes daily.     . polyethylene glycol (MIRALAX / GLYCOLAX) packet Take 17 g by mouth daily as needed for mild constipation.     . potassium chloride SA (K-DUR,KLOR-CON) 20 MEQ tablet Take 1 tablet (20 mEq total) by mouth daily. 30 tablet 2  . prochlorperazine (COMPAZINE) 10 MG tablet Take 10 mg by mouth every 6 (six) hours as needed for nausea or vomiting.     Marland Kitchen rOPINIRole (REQUIP) 1 MG tablet Take 1 mg by mouth at bedtime.    . sertraline (ZOLOFT) 25 MG tablet Take 25 mg by mouth every morning.     . simvastatin (ZOCOR) 10 MG tablet Take 10 mg by mouth daily.    . Tiotropium Bromide Monohydrate (SPIRIVA RESPIMAT) 2.5 MCG/ACT AERS Inhale 1 puff into the lungs daily.    . vitamin C (ASCORBIC ACID) 500 MG tablet Take 500 mg by mouth daily.     No current facility-administered medications for this visit.    Allergies Epipen; Heparin; Levaquin; Chlorhexidine gluconate; and Sulfa antibiotics  Family History: Family History  Problem Relation Age of Onset  . Diverticulitis Mother   . Heart attack Mother   . Ulcers Father     Social History: Social History   Social History  . Marital Status: Married    Spouse Name: N/A  . Number of Children: N/A  . Years of Education: N/A   Occupational History  . Not on file.   Social History Main Topics  . Smoking status: Former Smoker -- 1.00 packs/day for 25 years    Types: Cigarettes    Start date: 05/16/1948    Quit date: 05/17/1963  . Smokeless tobacco: Never Used     Comment: quit smoking 50 years ago  . Alcohol Use: No  . Drug Use: No  . Sexual Activity: Not on file   Other Topics Concern  . Not on file   Social History Narrative    Past Surgical History  Procedure Laterality Date  . Colon surgery  2014    Colostomy  . Cardiac surgery  2009  . Coronary angioplasty with stent placement  2009  . C section  1947, 1963  . Cholecystectomy  1973  . Esophagogastroduodenoscopy  2013  . Coronary artery bypass graft  aug 2009     x3  . Right chest pac  june 2015  . Eye surgery Bilateral  10 years ago    lens replacments cataracts  . Colostomy revision  N/A 06/12/2014    Procedure: COLOSTOMY REVISION;  Surgeon: [name], MD;  Location: [facility];  Service: General;  Laterality: N/A;    Past Medical History  Diagnosis Date  . Heart attack ([facility]) 2009  . GERD (gastroesophageal reflux disease)   . NHL (non-Hodgkin's lymphoma) ([facility]) 10/18/2013    finished chemo dec 2015, maintenanance retuxin  . History of blood transfusion 2009  . Iron deficiency anemia due to chronic blood loss 08/07/2014    Electrocardiogram:  4/27  SR rate 71  LAE no old MI  Low voltage  SB rate 53  Otherwise normal ECG   Assessment and Plan CAD:  Stable with no angina and good activity level.  Continue medical Rx Distant CABG in 2009 HTN:  Well controlled.  Continue current medications and low sodium Dash type diet.   Chol:  Continue simvastatin labs with primary target LDL under 100 with CAD despite age  Stop Zetia GI:  Good result with colostomy revision no cardiac complication with surgery  Bradycardia:  With some fatigue decrease coreg to daily  F/u with me in 6 months  Jenkins Rouge

## 2015-02-10 ENCOUNTER — Ambulatory Visit
Admission: RE | Admit: 2015-02-10 | Discharge: 2015-02-10 | Disposition: A | Discharge status: Home / Self Care | Payer: Medicare Other | Source: Ambulatory Visit

## 2015-02-10 DIAGNOSIS — Z1231 Encounter for screening mammogram for malignant neoplasm of breast: Secondary | ICD-10-CM

## 2015-02-11 ENCOUNTER — Ambulatory Visit (INDEPENDENT_AMBULATORY_CARE_PROVIDER_SITE_OTHER): Payer: Medicare Other | Admitting: Cardiovascular Disease

## 2015-02-11 ENCOUNTER — Encounter: Payer: Self-pay | Admitting: Cardiovascular Disease

## 2015-02-11 VITALS — BP 150/80 | HR 53 | Ht 62.0 in | Wt 165.6 lb

## 2015-02-11 DIAGNOSIS — I2510 Atherosclerotic heart disease of native coronary artery without angina pectoris: Secondary | ICD-10-CM

## 2015-02-11 DIAGNOSIS — I10 Essential (primary) hypertension: Secondary | ICD-10-CM

## 2015-02-11 DIAGNOSIS — I2583 Coronary atherosclerosis due to lipid rich plaque: Principal | ICD-10-CM

## 2015-02-11 MED ORDER — CARVEDILOL 3.125 MG PO TABS: ORAL_TABLET | ORAL | Status: DC

## 2015-02-11 NOTE — Patient Instructions (Signed)
Medication Instructions: Your physician has recommended you make the following change in your medication: STOP  ZETIA DECREASE  CARVEDILOL TO  1 TAB DAILY     Labwork:   Testing/Procedures:   Follow-Up: Your physician wants you to follow-up in: [place] will receive a reminder letter in the mail two months in advance. If you don't receive a letter, please call our office to schedule the follow-up appointment.  Any Other Special Instructions Will Be Listed Below (If Applicable).

## 2015-04-22 ENCOUNTER — Ambulatory Visit: Payer: Medicare Other

## 2015-04-22 ENCOUNTER — Other Ambulatory Visit: Payer: Medicare Other

## 2015-04-22 ENCOUNTER — Ambulatory Visit: Payer: Medicare Other | Admitting: Hematology & Oncology

## 2015-04-23 ENCOUNTER — Other Ambulatory Visit: Payer: Self-pay | Admitting: Nurse Practitioner

## 2015-04-28 ENCOUNTER — Ambulatory Visit (HOSPITAL_BASED_OUTPATIENT_CLINIC_OR_DEPARTMENT_OTHER): Payer: Medicare Other | Admitting: Hematology & Oncology

## 2015-04-28 ENCOUNTER — Other Ambulatory Visit (HOSPITAL_BASED_OUTPATIENT_CLINIC_OR_DEPARTMENT_OTHER): Payer: Medicare Other

## 2015-04-28 ENCOUNTER — Encounter: Payer: Self-pay | Admitting: Hematology & Oncology

## 2015-04-28 ENCOUNTER — Ambulatory Visit (HOSPITAL_BASED_OUTPATIENT_CLINIC_OR_DEPARTMENT_OTHER): Payer: Medicare Other

## 2015-04-28 VITALS — BP 150/54 | HR 52 | Temp 98.2°F | Resp 18 | Wt 165.0 lb

## 2015-04-28 VITALS — BP 112/71 | HR 54 | Resp 20

## 2015-04-28 DIAGNOSIS — D500 Iron deficiency anemia secondary to blood loss (chronic): Secondary | ICD-10-CM

## 2015-04-28 DIAGNOSIS — D508 Other iron deficiency anemias: Secondary | ICD-10-CM | POA: Diagnosis not present

## 2015-04-28 DIAGNOSIS — C8203 Follicular lymphoma grade I, intra-abdominal lymph nodes: Secondary | ICD-10-CM

## 2015-04-28 DIAGNOSIS — K909 Intestinal malabsorption, unspecified: Secondary | ICD-10-CM | POA: Diagnosis not present

## 2015-04-28 DIAGNOSIS — C8290 Follicular lymphoma, unspecified, unspecified site: Secondary | ICD-10-CM

## 2015-04-28 DIAGNOSIS — Z5112 Encounter for antineoplastic immunotherapy: Secondary | ICD-10-CM | POA: Diagnosis present

## 2015-04-28 DIAGNOSIS — C8207 Follicular lymphoma grade I, spleen: Secondary | ICD-10-CM

## 2015-04-28 DIAGNOSIS — C8590 Non-Hodgkin lymphoma, unspecified, unspecified site: Secondary | ICD-10-CM

## 2015-04-28 LAB — CBC WITH DIFFERENTIAL (CANCER CENTER ONLY)
BASO#: 0.2 10*3/uL (ref 0.0–0.2)
BASO%: 3.8 % — ABNORMAL HIGH (ref 0.0–2.0)
EOS ABS: 0.4 10*3/uL (ref 0.0–0.5)
EOS%: 10.3 % — AB (ref 0.0–7.0)
HCT: 32.9 % — ABNORMAL LOW (ref 34.8–46.6)
HGB: 10.9 g/dL — ABNORMAL LOW (ref 11.6–15.9)
LYMPH#: 0.8 10*3/uL — ABNORMAL LOW (ref 0.9–3.3)
LYMPH%: 21.5 % (ref 14.0–48.0)
MCH: 32.7 pg (ref 26.0–34.0)
MCHC: 33.1 g/dL (ref 32.0–36.0)
MCV: 99 fL (ref 81–101)
MONO#: 0.9 10*3/uL (ref 0.1–0.9)
MONO%: 22.8 % — AB (ref 0.0–13.0)
NEUT#: 1.6 10*3/uL (ref 1.5–6.5)
NEUT%: 41.6 % (ref 39.6–80.0)
PLATELETS: 121 10*3/uL — AB (ref 145–400)
RBC: 3.33 10*6/uL — ABNORMAL LOW (ref 3.70–5.32)
RDW: 13.1 % (ref 11.1–15.7)
WBC: 3.9 10*3/uL (ref 3.9–10.0)

## 2015-04-28 LAB — CMP (CANCER CENTER ONLY)
ALK PHOS: 54 U/L (ref 26–84)
ALT: 13 U/L (ref 10–47)
AST: 21 U/L (ref 11–38)
Albumin: 3.5 g/dL (ref 3.3–5.5)
BUN: 16 mg/dL (ref 7–22)
CO2: 23 mEq/L (ref 18–33)
CREATININE: 0.7 mg/dL (ref 0.6–1.2)
Calcium: 8.5 mg/dL (ref 8.0–10.3)
Chloride: 105 mEq/L (ref 98–108)
GLUCOSE: 95 mg/dL (ref 73–118)
POTASSIUM: 3.7 meq/L (ref 3.3–4.7)
SODIUM: 142 meq/L (ref 128–145)
TOTAL PROTEIN: 6.1 g/dL — AB (ref 6.4–8.1)
Total Bilirubin: 0.7 mg/dl (ref 0.20–1.60)

## 2015-04-28 LAB — IRON AND TIBC
%SAT: 23 % (ref 21–57)
Iron: 47 ug/dL (ref 41–142)
TIBC: 206 ug/dL — ABNORMAL LOW (ref 236–444)
UIBC: 159 ug/dL (ref 120–384)

## 2015-04-28 LAB — FERRITIN: Ferritin: 479 ng/ml — ABNORMAL HIGH (ref 9–269)

## 2015-04-28 LAB — LACTATE DEHYDROGENASE: LDH: 213 U/L (ref 125–245)

## 2015-04-28 MED ORDER — DIPHENHYDRAMINE HCL 25 MG PO CAPS
50.0000 mg | ORAL_CAPSULE | Freq: Once | ORAL | Status: AC
  Administered 2015-04-28: 50 mg via ORAL

## 2015-04-28 MED ORDER — ACETAMINOPHEN 325 MG PO TABS
650.0000 mg | ORAL_TABLET | Freq: Once | ORAL | Status: AC
  Administered 2015-04-28: 650 mg via ORAL

## 2015-04-28 MED ORDER — ACETAMINOPHEN 325 MG PO TABS
ORAL_TABLET | ORAL | Status: AC
  Filled 2015-04-28: qty 2

## 2015-04-28 MED ORDER — SODIUM CHLORIDE 0.9 % IV SOLN
Freq: Once | INTRAVENOUS | Status: AC
  Administered 2015-04-28: 11:00:00 via INTRAVENOUS

## 2015-04-28 MED ORDER — HEPARIN SOD (PORK) LOCK FLUSH 100 UNIT/ML IV SOLN
500.0000 [IU] | Freq: Once | INTRAVENOUS | Status: AC | PRN
  Administered 2015-04-28: 500 [IU]
  Filled 2015-04-28: qty 5

## 2015-04-28 MED ORDER — SODIUM CHLORIDE 0.9 % IJ SOLN
10.0000 mL | INTRAMUSCULAR | Status: DC | PRN
  Administered 2015-04-28: 10 mL
  Filled 2015-04-28: qty 10

## 2015-04-28 MED ORDER — SODIUM CHLORIDE 0.9 % IV SOLN
375.0000 mg/m2 | Freq: Once | INTRAVENOUS | Status: AC
  Administered 2015-04-28: 700 mg via INTRAVENOUS
  Filled 2015-04-28: qty 60

## 2015-04-28 MED ORDER — DIPHENHYDRAMINE HCL 25 MG PO CAPS
ORAL_CAPSULE | ORAL | Status: AC
  Filled 2015-04-28: qty 2

## 2015-04-28 NOTE — Patient Instructions (Signed)
Rituximab injection What is this medicine? RITUXIMAB (ri TUX i mab) is a monoclonal antibody. It is used commonly to treat non-Hodgkin lymphoma and other conditions. It is also used to treat rheumatoid arthritis (RA). In RA, this medicine slows the inflammatory process and help reduce joint pain and swelling. This medicine is often used with other cancer or arthritis medications. This medicine may be used for other purposes; ask your health care provider or pharmacist if you have questions. What should I tell my health care provider before I take this medicine? They need to know if you have any of these conditions: -blood disorders -heart disease -history of hepatitis B -infection (especially a virus infection such as chickenpox, cold sores, or herpes) -irregular heartbeat -kidney disease -lung or breathing disease, like asthma -lupus -an unusual or allergic reaction to rituximab, mouse proteins, other medicines, foods, dyes, or preservatives -pregnant or trying to get pregnant -breast-feeding How should I use this medicine? This medicine is for infusion into a vein. It is administered in a hospital or clinic by a specially trained health care professional. A special MedGuide will be given to you by the pharmacist with each prescription and refill. Be sure to read this information carefully each time. Talk to your pediatrician regarding the use of this medicine in children. This medicine is not approved for use in children. Overdosage: If you think you have taken too much of this medicine contact a poison control center or emergency room at once. NOTE: This medicine is only for you. Do not share this medicine with others. What if I miss a dose? It is important not to miss a dose. Call your doctor or health care professional if you are unable to keep an appointment. What may interact with this medicine? -cisplatin -medicines for blood pressure -some other medicines for  arthritis -vaccines This list may not describe all possible interactions. Give your health care provider a list of all the medicines, herbs, non-prescription drugs, or dietary supplements you use. Also tell them if you smoke, drink alcohol, or use illegal drugs. Some items may interact with your medicine. What should I watch for while using this medicine? Report any side effects that you notice during your treatment right away, such as changes in your breathing, fever, chills, dizziness or lightheadedness. These effects are more common with the first dose. Visit your prescriber or health care professional for checks on your progress. You will need to have regular blood work. Report any other side effects. The side effects of this medicine can continue after you finish your treatment. Continue your course of treatment even though you feel ill unless your doctor tells you to stop. Call your doctor or health care professional for advice if you get a fever, chills or sore throat, or other symptoms of a cold or flu. Do not treat yourself. This drug decreases your body's ability to fight infections. Try to avoid being around people who are sick. This medicine may increase your risk to bruise or bleed. Call your doctor or health care professional if you notice any unusual bleeding. Be careful brushing and flossing your teeth or using a toothpick because you may get an infection or bleed more easily. If you have any dental work done, tell your dentist you are receiving this medicine. Avoid taking products that contain aspirin, acetaminophen, ibuprofen, naproxen, or ketoprofen unless instructed by your doctor. These medicines may hide a fever. Do not become pregnant while taking this medicine. Women should inform their doctor if   they wish to become pregnant or think they might be pregnant. There is a potential for serious side effects to an unborn child. Talk to your health care professional or pharmacist for more  information. Do not breast-feed an infant while taking this medicine. What side effects may I notice from receiving this medicine? Side effects that you should report to your doctor or health care professional as soon as possible: -allergic reactions like skin rash, itching or hives, swelling of the face, lips, or tongue -low blood counts - this medicine may decrease the number of white blood cells, red blood cells and platelets. You may be at increased risk for infections and bleeding. -signs of infection - fever or chills, cough, sore throat, pain or difficulty passing urine -signs of decreased platelets or bleeding - bruising, pinpoint red spots on the skin, black, tarry stools, blood in the urine -signs of decreased red blood cells - unusually weak or tired, fainting spells, lightheadedness -breathing problems -confused, not responsive -chest pain -fast, irregular heartbeat -feeling faint or lightheaded, falls -mouth sores -redness, blistering, peeling or loosening of the skin, including inside the mouth -stomach pain -swelling of the ankles, feet, or hands -trouble passing urine or change in the amount of urine Side effects that usually do not require medical attention (report to your doctor or other health care professional if they continue or are bothersome): -anxiety -headache -loss of appetite -muscle aches -nausea -night sweats This list may not describe all possible side effects. Call your doctor for medical advice about side effects. You may report side effects to FDA at 1-800-FDA-1088. Where should I keep my medicine? This drug is given in a hospital or clinic and will not be stored at home. NOTE: This sheet is a summary. It may not cover all possible information. If you have questions about this medicine, talk to your doctor, pharmacist, or health care provider.    2016, Elsevier/Gold Standard. (2014-06-18 22:30:56)  

## 2015-04-28 NOTE — Progress Notes (Signed)
Hematology and Oncology Follow Up Visit  Makayla Fisher [id number] 04/16/1923 82 y.o. 04/28/2015   Principle Diagnosis:  Follicular large cell non-Hodgkin's lymphoma Iron deficiency anemia secondary to malabsorption  Current Therapy:   Status post cycle 3 of maintenance Rituxan IV iron-Feraheme as indicated     Interim History:  Ms.  Makayla Fisher is back for followup. She is doing quite well. She had her 89th birthday a couple weeks ago. She enjoyed this.  She had a good thanks giving Christmas.  She is worried about weight gain. I told her that from my point of view everything is okay with her gaining weight.  She's had no change in bowel or bladder habits. She has a colostomy. This seems to be functioning.  She's had no leg swelling.  She's had no weakness. She gets around with a rolling walker.  She's not noted any bleeding. She's had no fever.  Overall, her performance status is ECOG 1-2.  Medications:  Current outpatient prescriptions:  .  acetaminophen (TYLENOL) 500 MG tablet, Take 500 mg by mouth every 6 (six) hours as needed for mild pain., Disp: , Rfl:  .  amLODipine (NORVASC) 5 MG tablet, Take 5 mg by mouth every morning. , Disp: , Rfl:  .  aspirin EC 81 MG tablet, Take 81 mg by mouth daily., Disp: , Rfl:  .  carvedilol (COREG) 3.125 MG tablet, TAKE (1) TABLET BY MOUTH EACH MORNING., Disp: 30 tablet, Rfl: 5 .  cycloSPORINE (RESTASIS) 0.05 % ophthalmic emulsion, Place 1 drop into both eyes 2 (two) times daily. , Disp: , Rfl:  .  ergocalciferol (VITAMIN D2) 50000 UNITS capsule, Take 1 capsule (50,000 Units total) by mouth once a week., Disp: 12 capsule, Rfl: 4 .  famotidine (PEPCID) 20 MG tablet, Take 1 tablet (20 mg total) by mouth 2 (two) times daily., Disp: 60 tablet, Rfl: 6 .  furosemide (LASIX) 20 MG tablet, Take 20 mg by mouth daily. , Disp: , Rfl:  .  levothyroxine (SYNTHROID, LEVOTHROID) 75 MCG tablet, Take 75 mcg by mouth daily before breakfast., Disp: , Rfl:    .  lidocaine-prilocaine (EMLA) cream, Apply 1 application topically as needed (for port access)., Disp: , Rfl:  .  lisinopril (PRINIVIL,ZESTRIL) 20 MG tablet, Take 20 mg by mouth 2 (two) times daily., Disp: , Rfl:  .  LORazepam (ATIVAN) 0.5 MG tablet, Take 0.5 mg by mouth daily as needed for anxiety., Disp: , Rfl:  .  Menthol, Topical Analgesic, 1.4 % PTCH, Apply 1 patch topically daily., Disp: , Rfl:  .  Multiple Vitamin (MULTIVITAMIN WITH MINERALS) TABS tablet, Take 1 tablet by mouth daily., Disp: , Rfl:  .  nystatin cream (MYCOSTATIN), Apply 1 application topically daily as needed for dry skin., Disp: , Rfl:  .  ondansetron (ZOFRAN) 8 MG tablet, Take 1 tablet (8 mg total) by mouth 2 (two) times daily. For 4 days following chemo, Disp: 20 tablet, Rfl: 2 .  PATADAY 0.2 % SOLN, Place 1 drop into both eyes daily. , Disp: , Rfl:  .  polyethylene glycol (MIRALAX / GLYCOLAX) packet, Take 17 g by mouth daily as needed for mild constipation., Disp: , Rfl:  .  potassium chloride SA (K-DUR,KLOR-CON) 20 MEQ tablet, Take 1 tablet (20 mEq total) by mouth daily., Disp: 30 tablet, Rfl: 2 .  prochlorperazine (COMPAZINE) 10 MG tablet, Take 10 mg by mouth every 6 (six) hours as needed for nausea or vomiting. , Disp: , Rfl:  .  rOPINIRole (REQUIP) 1  MG tablet, Take 1 mg by mouth at bedtime., Disp: , Rfl:  .  sertraline (ZOLOFT) 25 MG tablet, Take 25 mg by mouth every morning. , Disp: , Rfl:  .  simvastatin (ZOCOR) 10 MG tablet, Take 10 mg by mouth daily., Disp: , Rfl:  .  Tiotropium Bromide Monohydrate (SPIRIVA RESPIMAT) 2.5 MCG/ACT AERS, Inhale 1 puff into the lungs daily., Disp: , Rfl:  .  vitamin C (ASCORBIC ACID) 500 MG tablet, Take 500 mg by mouth daily., Disp: , Rfl:   Allergies:  Allergies  Allergen Reactions  . Epipen [Epinephrine Hcl]     Heart palpitations  . Heparin     Unsure of side affects (maybe affected platelets per pt)  . Levaquin [Levofloxacin In D5w] Other (See Comments)    Weakness  .  Chlorhexidine Gluconate Other (See Comments)    Pt declined use and suspects she may have an intolerance,   . Sulfa Antibiotics Rash    Past Medical History, Surgical history, Social history, and Family History were reviewed and updated.  Review of Systems: As above  Physical Exam:  weight is 165 lb (74.844 kg). Her oral temperature is 98.2 F (36.8 C). Her blood pressure is 150/54 and her pulse is 52. Her respiration is 18.   Elderly white female. Head and neck exam shows no ocular or oral lesions. There are no palpable cervical or supraclavicular lymph nodes. Lungs are clear bilaterally. Cardiac exam regular rate and rhythm with no murmurs rubs or bruits. Abdomen is soft. She has good bowel sounds. The ostomy is intact. There is no stool in the ostomy bag. There is no palpable liver or spleen tip. Her laparotomy scars are well-healed. Her colostomy is in the left lower quadrant. Back exam shows no tenderness over the spine ribs or hips. Extremities shows no clubbing cyanosis or edema. Skin exam no rashes, ecchymoses or petechia. She does have some areas of Candida in the skin folds of her abdomen neurological exam is nonfocal..   Lab Results  Component Value Date   WBC 3.9 01/22/2015   HGB 10.6* 01/22/2015   HCT 32.6* 01/22/2015   MCV 101 01/22/2015   PLT 110* 01/22/2015     Chemistry      Component Value Date/Time   NA 142 04/28/2015 0952   NA 141 06/11/2014 1052   NA 141 05/08/2014 1041   K 3.7 04/28/2015 0952   K 4.9 06/11/2014 1052   K 4.5 05/08/2014 1041   CL 105 04/28/2015 0952   CL 110 06/11/2014 1052   CO2 23 04/28/2015 0952   CO2 25 06/11/2014 1052   CO2 24 05/08/2014 1041   BUN 16 04/28/2015 0952   BUN 14 06/11/2014 1052   BUN 14.2 05/08/2014 1041   CREATININE 0.7 04/28/2015 0952   CREATININE 0.77 06/11/2014 1052   CREATININE 0.8 05/08/2014 1041      Component Value Date/Time   CALCIUM 8.5 04/28/2015 0952   CALCIUM 9.5 06/11/2014 1052   CALCIUM 9.2  05/08/2014 1041   ALKPHOS 54 04/28/2015 0952   ALKPHOS 66 05/08/2014 1041   ALKPHOS 90 09/25/2013 1141   AST 21 04/28/2015 0952   AST 23 05/08/2014 1041   AST 15 09/25/2013 1141   ALT 13 04/28/2015 0952   ALT 10 05/08/2014 1041   ALT <8 09/25/2013 1141   BILITOT 0.70 04/28/2015 0952   BILITOT 0.42 05/08/2014 1041   BILITOT 0.4 09/25/2013 1141         Impression and Plan:  Ms. Garard is 82 year old female. She has a follicular large cell lymphoma.   I will continue with the Rituxan.  We will plan to get her back here in 3 months.   [name], MD 1/3/201710:53 AM

## 2015-04-30 ENCOUNTER — Other Ambulatory Visit: Payer: Self-pay | Admitting: Cardiovascular Disease

## 2015-04-30 DIAGNOSIS — I6523 Occlusion and stenosis of bilateral carotid arteries: Secondary | ICD-10-CM

## 2015-05-01 ENCOUNTER — Other Ambulatory Visit: Payer: Self-pay

## 2015-05-01 DIAGNOSIS — I739 Peripheral vascular disease, unspecified: Principal | ICD-10-CM

## 2015-05-01 DIAGNOSIS — I779 Disorder of arteries and arterioles, unspecified: Secondary | ICD-10-CM

## 2015-05-25 ENCOUNTER — Ambulatory Visit (HOSPITAL_COMMUNITY)
Admission: RE | Admit: 2015-05-25 | Discharge: 2015-05-25 | Disposition: A | Discharge status: Home / Self Care | Payer: Medicare Other | Source: Ambulatory Visit | Attending: Cardiology | Admitting: Cardiology

## 2015-05-25 DIAGNOSIS — I6523 Occlusion and stenosis of bilateral carotid arteries: Secondary | ICD-10-CM | POA: Diagnosis not present

## 2015-07-27 ENCOUNTER — Ambulatory Visit (HOSPITAL_BASED_OUTPATIENT_CLINIC_OR_DEPARTMENT_OTHER): Payer: Medicare Other

## 2015-07-27 ENCOUNTER — Other Ambulatory Visit (HOSPITAL_BASED_OUTPATIENT_CLINIC_OR_DEPARTMENT_OTHER): Payer: Medicare Other

## 2015-07-27 ENCOUNTER — Ambulatory Visit: Payer: Medicare Other | Admitting: Hematology & Oncology

## 2015-07-27 ENCOUNTER — Ambulatory Visit (HOSPITAL_BASED_OUTPATIENT_CLINIC_OR_DEPARTMENT_OTHER): Payer: Medicare Other | Admitting: Hematology & Oncology

## 2015-07-27 ENCOUNTER — Ambulatory Visit: Payer: Medicare Other

## 2015-07-27 ENCOUNTER — Other Ambulatory Visit: Payer: Medicare Other

## 2015-07-27 VITALS — BP 130/73 | HR 57 | Resp 14

## 2015-07-27 DIAGNOSIS — C8207 Follicular lymphoma grade I, spleen: Secondary | ICD-10-CM

## 2015-07-27 DIAGNOSIS — D500 Iron deficiency anemia secondary to blood loss (chronic): Secondary | ICD-10-CM

## 2015-07-27 DIAGNOSIS — C8228 Follicular lymphoma grade III, unspecified, lymph nodes of multiple sites: Secondary | ICD-10-CM

## 2015-07-27 DIAGNOSIS — T386X5A Adverse effect of antigonadotrophins, antiestrogens, antiandrogens, not elsewhere classified, initial encounter: Secondary | ICD-10-CM

## 2015-07-27 DIAGNOSIS — Z5112 Encounter for antineoplastic immunotherapy: Secondary | ICD-10-CM

## 2015-07-27 DIAGNOSIS — M818 Other osteoporosis without current pathological fracture: Secondary | ICD-10-CM

## 2015-07-27 DIAGNOSIS — C8203 Follicular lymphoma grade I, intra-abdominal lymph nodes: Secondary | ICD-10-CM

## 2015-07-27 LAB — CMP (CANCER CENTER ONLY)
ALK PHOS: 56 U/L (ref 26–84)
ALT: 21 U/L (ref 10–47)
AST: 27 U/L (ref 11–38)
Albumin: 3.6 g/dL (ref 3.3–5.5)
BILIRUBIN TOTAL: 0.6 mg/dL (ref 0.20–1.60)
BUN: 16 mg/dL (ref 7–22)
CALCIUM: 9.3 mg/dL (ref 8.0–10.3)
CO2: 23 mEq/L (ref 18–33)
Chloride: 106 mEq/L (ref 98–108)
Creat: 0.6 mg/dl (ref 0.6–1.2)
GLUCOSE: 92 mg/dL (ref 73–118)
Potassium: 3.8 mEq/L (ref 3.3–4.7)
Sodium: 141 mEq/L (ref 128–145)
Total Protein: 6.2 g/dL — ABNORMAL LOW (ref 6.4–8.1)

## 2015-07-27 LAB — CBC WITH DIFFERENTIAL (CANCER CENTER ONLY)
BASO#: 0.1 10*3/uL (ref 0.0–0.2)
BASO%: 1.3 % (ref 0.0–2.0)
EOS ABS: 0.1 10*3/uL (ref 0.0–0.5)
EOS%: 3.7 % (ref 0.0–7.0)
HEMATOCRIT: 35.3 % (ref 34.8–46.6)
HEMOGLOBIN: 11.8 g/dL (ref 11.6–15.9)
LYMPH#: 0.9 10*3/uL (ref 0.9–3.3)
LYMPH%: 23.8 % (ref 14.0–48.0)
MCH: 33.1 pg (ref 26.0–34.0)
MCHC: 33.4 g/dL (ref 32.0–36.0)
MCV: 99 fL (ref 81–101)
MONO#: 0.8 10*3/uL (ref 0.1–0.9)
MONO%: 20.1 % — ABNORMAL HIGH (ref 0.0–13.0)
NEUT%: 51.1 % (ref 39.6–80.0)
NEUTROS ABS: 1.9 10*3/uL (ref 1.5–6.5)
Platelets: 126 10*3/uL — ABNORMAL LOW (ref 145–400)
RBC: 3.56 10*6/uL — ABNORMAL LOW (ref 3.70–5.32)
RDW: 13.4 % (ref 11.1–15.7)
WBC: 3.8 10*3/uL — ABNORMAL LOW (ref 3.9–10.0)

## 2015-07-27 LAB — IRON AND TIBC
%SAT: 24 % (ref 21–57)
Iron: 57 ug/dL (ref 41–142)
TIBC: 235 ug/dL — ABNORMAL LOW (ref 236–444)
UIBC: 178 ug/dL (ref 120–384)

## 2015-07-27 LAB — FERRITIN: Ferritin: 241 ng/ml (ref 9–269)

## 2015-07-27 LAB — LACTATE DEHYDROGENASE: LDH: 347 U/L — AB (ref 125–245)

## 2015-07-27 MED ORDER — HEPARIN SOD (PORK) LOCK FLUSH 100 UNIT/ML IV SOLN
250.0000 [IU] | Freq: Once | INTRAVENOUS | Status: DC | PRN
  Filled 2015-07-27: qty 5

## 2015-07-27 MED ORDER — SODIUM CHLORIDE 0.9 % IV SOLN
Freq: Once | INTRAVENOUS | Status: AC
  Administered 2015-07-27: 12:00:00 via INTRAVENOUS

## 2015-07-27 MED ORDER — ALTEPLASE 2 MG IJ SOLR
2.0000 mg | Freq: Once | INTRAMUSCULAR | Status: DC | PRN
  Filled 2015-07-27: qty 2

## 2015-07-27 MED ORDER — SODIUM CHLORIDE 0.9 % IJ SOLN
3.0000 mL | INTRAMUSCULAR | Status: DC | PRN
  Filled 2015-07-27: qty 10

## 2015-07-27 MED ORDER — ACETAMINOPHEN 325 MG PO TABS
ORAL_TABLET | ORAL | Status: AC
Start: 2015-07-27 — End: 2015-07-27
  Filled 2015-07-27: qty 2

## 2015-07-27 MED ORDER — HEPARIN SOD (PORK) LOCK FLUSH 100 UNIT/ML IV SOLN
500.0000 [IU] | Freq: Once | INTRAVENOUS | Status: AC | PRN
  Administered 2015-07-27: 500 [IU]
  Filled 2015-07-27: qty 5

## 2015-07-27 MED ORDER — DIPHENHYDRAMINE HCL 25 MG PO CAPS
50.0000 mg | ORAL_CAPSULE | Freq: Once | ORAL | Status: AC
  Administered 2015-07-27: 50 mg via ORAL

## 2015-07-27 MED ORDER — ACETAMINOPHEN 325 MG PO TABS
650.0000 mg | ORAL_TABLET | Freq: Once | ORAL | Status: AC
  Administered 2015-07-27: 650 mg via ORAL

## 2015-07-27 MED ORDER — SODIUM CHLORIDE 0.9 % IJ SOLN
10.0000 mL | INTRAMUSCULAR | Status: DC | PRN
  Administered 2015-07-27: 10 mL
  Filled 2015-07-27: qty 10

## 2015-07-27 MED ORDER — DIPHENHYDRAMINE HCL 25 MG PO CAPS
ORAL_CAPSULE | ORAL | Status: AC
  Filled 2015-07-27: qty 1

## 2015-07-27 MED ORDER — SODIUM CHLORIDE 0.9 % IV SOLN
375.0000 mg/m2 | Freq: Once | INTRAVENOUS | Status: AC
  Administered 2015-07-27: 700 mg via INTRAVENOUS
  Filled 2015-07-27: qty 60

## 2015-07-27 NOTE — Patient Instructions (Signed)
Rituximab injection What is this medicine? RITUXIMAB (ri TUX i mab) is a monoclonal antibody. It is used commonly to treat non-Hodgkin lymphoma and other conditions. It is also used to treat rheumatoid arthritis (RA). In RA, this medicine slows the inflammatory process and help reduce joint pain and swelling. This medicine is often used with other cancer or arthritis medications. This medicine may be used for other purposes; ask your health care provider or pharmacist if you have questions. What should I tell my health care provider before I take this medicine? They need to know if you have any of these conditions: -blood disorders -heart disease -history of hepatitis B -infection (especially a virus infection such as chickenpox, cold sores, or herpes) -irregular heartbeat -kidney disease -lung or breathing disease, like asthma -lupus -an unusual or allergic reaction to rituximab, mouse proteins, other medicines, foods, dyes, or preservatives -pregnant or trying to get pregnant -breast-feeding How should I use this medicine? This medicine is for infusion into a vein. It is administered in a hospital or clinic by a specially trained health care professional. A special MedGuide will be given to you by the pharmacist with each prescription and refill. Be sure to read this information carefully each time. Talk to your pediatrician regarding the use of this medicine in children. This medicine is not approved for use in children. Overdosage: If you think you have taken too much of this medicine contact a poison control center or emergency room at once. NOTE: This medicine is only for you. Do not share this medicine with others. What if I miss a dose? It is important not to miss a dose. Call your doctor or health care professional if you are unable to keep an appointment. What may interact with this medicine? -cisplatin -medicines for blood pressure -some other medicines for  arthritis -vaccines This list may not describe all possible interactions. Give your health care provider a list of all the medicines, herbs, non-prescription drugs, or dietary supplements you use. Also tell them if you smoke, drink alcohol, or use illegal drugs. Some items may interact with your medicine. What should I watch for while using this medicine? Report any side effects that you notice during your treatment right away, such as changes in your breathing, fever, chills, dizziness or lightheadedness. These effects are more common with the first dose. Visit your prescriber or health care professional for checks on your progress. You will need to have regular blood work. Report any other side effects. The side effects of this medicine can continue after you finish your treatment. Continue your course of treatment even though you feel ill unless your doctor tells you to stop. Call your doctor or health care professional for advice if you get a fever, chills or sore throat, or other symptoms of a cold or flu. Do not treat yourself. This drug decreases your body's ability to fight infections. Try to avoid being around people who are sick. This medicine may increase your risk to bruise or bleed. Call your doctor or health care professional if you notice any unusual bleeding. Be careful brushing and flossing your teeth or using a toothpick because you may get an infection or bleed more easily. If you have any dental work done, tell your dentist you are receiving this medicine. Avoid taking products that contain aspirin, acetaminophen, ibuprofen, naproxen, or ketoprofen unless instructed by your doctor. These medicines may hide a fever. Do not become pregnant while taking this medicine. Women should inform their doctor if   they wish to become pregnant or think they might be pregnant. There is a potential for serious side effects to an unborn child. Talk to your health care professional or pharmacist for more  information. Do not breast-feed an infant while taking this medicine. What side effects may I notice from receiving this medicine? Side effects that you should report to your doctor or health care professional as soon as possible: -allergic reactions like skin rash, itching or hives, swelling of the face, lips, or tongue -low blood counts - this medicine may decrease the number of white blood cells, red blood cells and platelets. You may be at increased risk for infections and bleeding. -signs of infection - fever or chills, cough, sore throat, pain or difficulty passing urine -signs of decreased platelets or bleeding - bruising, pinpoint red spots on the skin, black, tarry stools, blood in the urine -signs of decreased red blood cells - unusually weak or tired, fainting spells, lightheadedness -breathing problems -confused, not responsive -chest pain -fast, irregular heartbeat -feeling faint or lightheaded, falls -mouth sores -redness, blistering, peeling or loosening of the skin, including inside the mouth -stomach pain -swelling of the ankles, feet, or hands -trouble passing urine or change in the amount of urine Side effects that usually do not require medical attention (report to your doctor or other health care professional if they continue or are bothersome): -anxiety -headache -loss of appetite -muscle aches -nausea -night sweats This list may not describe all possible side effects. Call your doctor for medical advice about side effects. You may report side effects to FDA at 1-800-FDA-1088. Where should I keep my medicine? This drug is given in a hospital or clinic and will not be stored at home. NOTE: This sheet is a summary. It may not cover all possible information. If you have questions about this medicine, talk to your doctor, pharmacist, or health care provider.    2016, Elsevier/Gold Standard. (2014-06-18 22:30:56)  

## 2015-07-27 NOTE — Progress Notes (Signed)
Hematology and Oncology Follow Up Visit  Makayla Fisher [id number] 05-Mar-1923 82 y.o. 07/27/2015   Principle Diagnosis:  Follicular large cell non-Hodgkin's lymphoma Iron deficiency anemia secondary to malabsorption  Current Therapy:   Status post cycle 4 of maintenance Rituxan IV iron-Feraheme as indicated     Interim History:  Ms.  Makayla is back for followup. She is doing quite well. She had no proms over the winter. She still is in the nursing home. She is doing pretty well there.  She's had no problems with bowels or bladder. She has a colostomy  and she sees the surgeon next week.  She's had no abdominal pain. She has an occasional discomfort in the left lower quadrant. There is no associated nausea vomiting.  She's had no bleeding. She's had no fever. She had no cough or shortness of breath. She's had no leg swelling area  She wants to see a dermatologist. I told her that the doctor at the nursing home can make that referral for her.  Overall, her performance status is ECOG 1-2.  Medications:  Current outpatient prescriptions:  .  acetaminophen (TYLENOL) 500 MG tablet, Take 500 mg by mouth every 6 (six) hours as needed for mild pain., Disp: , Rfl:  .  amLODipine (NORVASC) 5 MG tablet, Take 5 mg by mouth every morning. , Disp: , Rfl:  .  aspirin EC 81 MG tablet, Take 81 mg by mouth daily., Disp: , Rfl:  .  carvedilol (COREG) 3.125 MG tablet, TAKE (1) TABLET BY MOUTH EACH MORNING., Disp: 30 tablet, Rfl: 5 .  cycloSPORINE (RESTASIS) 0.05 % ophthalmic emulsion, Place 1 drop into both eyes 2 (two) times daily. , Disp: , Rfl:  .  ergocalciferol (VITAMIN D2) 50000 UNITS capsule, Take 1 capsule (50,000 Units total) by mouth once a week., Disp: 12 capsule, Rfl: 4 .  famotidine (PEPCID) 20 MG tablet, Take 1 tablet (20 mg total) by mouth 2 (two) times daily., Disp: 60 tablet, Rfl: 6 .  furosemide (LASIX) 20 MG tablet, Take 20 mg by mouth daily. , Disp: , Rfl:  .  levothyroxine  (SYNTHROID, LEVOTHROID) 75 MCG tablet, Take 75 mcg by mouth daily before breakfast., Disp: , Rfl:  .  lidocaine-prilocaine (EMLA) cream, Apply 1 application topically as needed (for port access)., Disp: , Rfl:  .  lisinopril (PRINIVIL,ZESTRIL) 20 MG tablet, Take 20 mg by mouth 2 (two) times daily., Disp: , Rfl:  .  LORazepam (ATIVAN) 0.5 MG tablet, Take 0.5 mg by mouth daily as needed for anxiety., Disp: , Rfl:  .  Menthol, Topical Analgesic, 1.4 % PTCH, Apply 1 patch topically daily., Disp: , Rfl:  .  Multiple Vitamin (MULTIVITAMIN WITH MINERALS) TABS tablet, Take 1 tablet by mouth daily., Disp: , Rfl:  .  nystatin cream (MYCOSTATIN), Apply 1 application topically daily as needed for dry skin., Disp: , Rfl:  .  ondansetron (ZOFRAN) 8 MG tablet, Take 1 tablet (8 mg total) by mouth 2 (two) times daily. For 4 days following chemo, Disp: 20 tablet, Rfl: 2 .  PATADAY 0.2 % SOLN, Place 1 drop into both eyes daily. , Disp: , Rfl:  .  polyethylene glycol (MIRALAX / GLYCOLAX) packet, Take 17 g by mouth daily as needed for mild constipation., Disp: , Rfl:  .  potassium chloride SA (K-DUR,KLOR-CON) 20 MEQ tablet, Take 1 tablet (20 mEq total) by mouth daily., Disp: 30 tablet, Rfl: 2 .  prochlorperazine (COMPAZINE) 10 MG tablet, Take 10 mg by mouth every 6 (  six) hours as needed for nausea or vomiting. , Disp: , Rfl:  .  rOPINIRole (REQUIP) 1 MG tablet, Take 1 mg by mouth at bedtime., Disp: , Rfl:  .  sertraline (ZOLOFT) 25 MG tablet, Take 25 mg by mouth every morning. , Disp: , Rfl:  .  simvastatin (ZOCOR) 10 MG tablet, Take 10 mg by mouth daily., Disp: , Rfl:  .  SPIRIVA HANDIHALER 18 MCG inhalation capsule, Place 18 mcg into inhaler and inhale as needed., Disp: , Rfl:  .  Tiotropium Bromide Monohydrate (SPIRIVA RESPIMAT) 2.5 MCG/ACT AERS, Inhale 1 puff into the lungs daily., Disp: , Rfl:  .  triamcinolone cream (KENALOG) 0.1 %, Apply 0.1 application topically 2 (two) times daily., Disp: , Rfl:  .  vitamin C  (ASCORBIC ACID) 500 MG tablet, Take 500 mg by mouth daily., Disp: , Rfl:   Allergies:  Allergies  Allergen Reactions  . Epipen [Epinephrine Hcl]     Heart palpitations  . Heparin     Unsure of side affects (maybe affected platelets per pt)  . Levaquin [Levofloxacin In D5w] Other (See Comments)    Weakness  . Chlorhexidine Gluconate Other (See Comments)    Pt declined use and suspects she may have an intolerance,   . Sulfa Antibiotics Rash    Past Medical History, Surgical history, Social history, and Family History were reviewed and updated.  Review of Systems: As above  Physical Exam:  vitals were not taken for this visit.  Elderly white female. Head and neck exam shows no ocular or oral lesions. There are no palpable cervical or supraclavicular lymph nodes. Lungs are clear bilaterally. Cardiac exam regular rate and rhythm with no murmurs rubs or bruits. Abdomen is soft. She has good bowel sounds. The ostomy is intact. There is no stool in the ostomy bag. There is no palpable liver or spleen tip. Her laparotomy scars are well-healed. Her colostomy is in the left lower quadrant. Back exam shows no tenderness over the spine ribs or hips. Extremities shows no clubbing cyanosis or edema. Skin exam no rashes, ecchymoses or petechia. She does have some areas of Candida in the skin folds of her abdomen neurological exam is nonfocal..   Lab Results  Component Value Date   WBC 3.8* 07/27/2015   HGB 11.8 07/27/2015   HCT 35.3 07/27/2015   MCV 99 07/27/2015   PLT 126* 07/27/2015     Chemistry      Component Value Date/Time   NA 141 07/27/2015 0938   NA 141 06/11/2014 1052   NA 141 05/08/2014 1041   K 3.8 07/27/2015 0938   K 4.9 06/11/2014 1052   K 4.5 05/08/2014 1041   CL 106 07/27/2015 0938   CL 110 06/11/2014 1052   CO2 23 07/27/2015 0938   CO2 25 06/11/2014 1052   CO2 24 05/08/2014 1041   BUN 16 07/27/2015 0938   BUN 14 06/11/2014 1052   BUN 14.2 05/08/2014 1041    CREATININE 0.6 07/27/2015 0938   CREATININE 0.77 06/11/2014 1052   CREATININE 0.8 05/08/2014 1041      Component Value Date/Time   CALCIUM 9.3 07/27/2015 0938   CALCIUM 9.5 06/11/2014 1052   CALCIUM 9.2 05/08/2014 1041   ALKPHOS 56 07/27/2015 0938   ALKPHOS 66 05/08/2014 1041   ALKPHOS 90 09/25/2013 1141   AST 27 07/27/2015 0938   AST 23 05/08/2014 1041   AST 15 09/25/2013 1141   ALT 21 07/27/2015 0938   ALT 10 05/08/2014  1041   ALT <8 09/25/2013 1141   BILITOT 0.60 07/27/2015 0938   BILITOT 0.42 05/08/2014 1041   BILITOT 0.4 09/25/2013 1141         Impression and Plan: Ms. Hendy is 82 year old female. She has a follicular large cell lymphoma. I do not see any evidence of recurrent disease. I suppose that she is at high risk for occurrence. Her labs look really good.  I do not see a need for any scans right now.  Overall, I think she really is doing quite well. Her quality of life is about as good as she would like it to be  I will continue with the Rituxan. I think were about halfway through the 2 years.  We will plan to get her back here in 3 months.   [name], MD 4/3/201710:53 AM

## 2015-07-28 ENCOUNTER — Telehealth: Payer: Self-pay | Admitting: *Deleted

## 2015-07-28 NOTE — Telephone Encounter (Addendum)
Message left on personal voice mail  ----- Message from [name], MD sent at 07/27/2015  5:31 PM EDT ----- Call - iron levels are great!!! pete

## 2015-08-18 NOTE — Progress Notes (Signed)
Patient ID: Makayla Fisher, female   DOB: Jul 21, 1922, 82 y.o.   MRN: [id number]    82 y.o.  Referred on 12/15  for preop clearance Has follicular large cell lymphoma of abdomen.  Ongoing Rx with chemo by Dr Marin Olp.  Has ostomy and wants to have It reversed  It is not external , leaks stool and gets irritated often  Colostomy done 2014 She underwent an open right colectomy and sigmoid colectomy with ileotransverse anastomosis and end descending colostomy with Hartmann's pouch. She had what sounds like a very long protracted post operative course requiring mechanical ventilation.  It also appears that she went into acute renal failure. Surgery was done emergently due to diverticular perforation with stricture.  PET scan 8/15 showed near total resolution of cancer in abdomen and repeat scan planned for January  Distant history of CAD  With stent /[facility]ABG in [facility]  Records not available in [place]  Echo done 3/15 for chemo in [place] showed normal EF , abnormal relaxation and mild MR  Reviewed  05/25/15  carotid duplex reviewed  40-59% RICA stenosis.  F/U duplex in one year   06/13/15   Had colostomy revision for stricture with slow recovery but no cardiac complications by [name]  Note:  Patient has a song to sing for me next visit  02/11/15  Beta blocker decreased due to bradycardia  Sleeping issues and fatiuge.  No dyspnea   ROS: Denies fever, malais, weight loss, blurry vision, decreased visual acuity, cough, sputum, SOB, hemoptysis, pleuritic pain, palpitaitons, heartburn, abdominal pain, melena, lower extremity edema, claudication, or rash.  All other systems reviewed and negative   General: Affect appropriate Elderly spry female  HEENT: normal Neck supple with no adenopathy JVP normal no bruits no thyromegaly Lungs diffuse crackles no wheezing and good diaphragmatic motion Heart:  S1/S2 SEM  murmur,rub, gallop or click previous sternotomy PMI  normal Abdomen: benighn, BS positve, no tenderness, no AAA ostomy with bag LLQ no bruit.  No HSM or HJR Distal pulses intact with no bruits No edema Neuro non-focal Skin warm and dry No muscular weakness  Medications Current Outpatient Prescriptions  Medication Sig Dispense Refill  . amLODipine (NORVASC) 5 MG tablet Take 5 mg by mouth every morning.     Marland Kitchen aspirin EC 81 MG tablet Take 81 mg by mouth daily.    . Calcium Carb-Cholecalciferol (CALCIUM-VITAMIN D) 600-400 MG-UNIT TABS Take 1 tablet by mouth daily.    . Cranberry 425 MG CAPS Take 1 capsule by mouth daily.    . ergocalciferol (VITAMIN D2) 50000 UNITS capsule Take 1 capsule (50,000 Units total) by mouth once a week. 12 capsule 4  . famotidine (PEPCID) 20 MG tablet Take 1 tablet (20 mg total) by mouth 2 (two) times daily. 60 tablet 6  . furosemide (LASIX) 20 MG tablet Take 20 mg by mouth daily.     Marland Kitchen levothyroxine (SYNTHROID, LEVOTHROID) 75 MCG tablet Take 75 mcg by mouth daily before breakfast.    . lidocaine-prilocaine (EMLA) cream Apply 1 application topically as needed (for port access).    Marland Kitchen lisinopril (PRINIVIL,ZESTRIL) 20 MG tablet Take 20 mg by mouth 2 (two) times daily.    Marland Kitchen LORazepam (ATIVAN) 0.5 MG tablet Take 0.5 mg by mouth daily as needed for anxiety.    . Multiple Vitamin (MULTIVITAMIN WITH MINERALS) TABS tablet Take 1 tablet by mouth daily.    Marland Kitchen nystatin cream (MYCOSTATIN) Apply 1 application topically daily as needed  for dry skin.    Marland Kitchen ondansetron (ZOFRAN) 8 MG tablet Take 1 tablet (8 mg total) by mouth 2 (two) times daily. For 4 days following chemo 20 tablet 2  . potassium chloride (K-DUR) 10 MEQ tablet Take 10 mEq by mouth daily.    Marland Kitchen rOPINIRole (REQUIP) 1 MG tablet Take 1 mg by mouth at bedtime.    . sertraline (ZOLOFT) 25 MG tablet Take 25 mg by mouth every morning.     Marland Kitchen SPIRIVA HANDIHALER 18 MCG inhalation capsule Place 18 mcg into inhaler and inhale as needed.    . vitamin C (ASCORBIC ACID) 500 MG tablet  Take 500 mg by mouth daily.     No current facility-administered medications for this visit.    Allergies Epipen; Heparin; Levaquin; Chlorhexidine gluconate; and Sulfa antibiotics  Family History: Family History  Problem Relation Age of Onset  . Diverticulitis Mother   . Heart attack Mother   . Ulcers Father     Social History: Social History   Social History  . Marital Status: Married    Spouse Name: N/A  . Number of Children: N/A  . Years of Education: N/A   Occupational History  . Not on file.   Social History Main Topics  . Smoking status: Former Smoker -- 1.00 packs/day for 25 years    Types: Cigarettes    Start date: 05/16/1948    Quit date: 05/17/1963  . Smokeless tobacco: Never Used     Comment: quit smoking 50 years ago  . Alcohol Use: No  . Drug Use: No  . Sexual Activity: Not on file   Other Topics Concern  . Not on file   Social History Narrative    Past Surgical History  Procedure Laterality Date  . Colon surgery  2014    Colostomy  . Cardiac surgery  2009  . Coronary angioplasty with stent placement  2009  . C section  1947, 1963  . Cholecystectomy  1973  . Esophagogastroduodenoscopy  2013  . Coronary artery bypass graft  aug 2009     x3  . Right chest pac  june 2015  . Eye surgery Bilateral  10 years ago    lens replacments cataracts  . Colostomy revision N/A 06/12/2014    Procedure: COLOSTOMY REVISION;  Surgeon: [name], MD;  Location: [facility];  Service: General;  Laterality: N/A;    Past Medical History  Diagnosis Date  . Heart attack ([place]) 2009  . GERD (gastroesophageal reflux disease)   . NHL (non-Hodgkin's lymphoma) ([place]) 10/18/2013    finished chemo dec 2015, maintenanance retuxin  . History of blood transfusion 2009  . Iron deficiency anemia due to chronic blood loss 08/07/2014    Electrocardiogram:  4/27  SR rate 71  LAE no old MI  Low voltage  SB rate 53  Otherwise normal ECG   Assessment and Plan CAD:  Stable with  no angina and good activity level.  Continue medical Rx Distant CABG in 2009 HTN:  Well controlled.  Continue current medications and low sodium Dash type diet.   Chol:  Continue simvastatin labs with primary target LDL under 100 with CAD despite age  Stop Zetia GI:  Good result with colostomy revision no cardiac complication with surgery  Bradycardia:  Improved with lower dose beta blocker no indication for pacer  Carotid:  40-59% RICA stenosis f/u duplex 04/2016 Pulmonary:  Abnormal exam f/u CXR today   F/u with me in 6 months  Jenkins Rouge

## 2015-08-20 ENCOUNTER — Ambulatory Visit: Payer: Medicare Other | Admitting: Cardiovascular Disease

## 2015-08-24 ENCOUNTER — Encounter: Payer: Self-pay | Admitting: Cardiovascular Disease

## 2015-08-25 ENCOUNTER — Ambulatory Visit (INDEPENDENT_AMBULATORY_CARE_PROVIDER_SITE_OTHER)
Admission: RE | Admit: 2015-08-25 | Discharge: 2015-08-25 | Disposition: A | Discharge status: Home / Self Care | Payer: Medicare Other | Source: Ambulatory Visit | Attending: Cardiovascular Disease | Admitting: Cardiovascular Disease

## 2015-08-25 ENCOUNTER — Ambulatory Visit (INDEPENDENT_AMBULATORY_CARE_PROVIDER_SITE_OTHER): Payer: Medicare Other | Admitting: Cardiovascular Disease

## 2015-08-25 ENCOUNTER — Encounter: Payer: Self-pay | Admitting: Cardiovascular Disease

## 2015-08-25 VITALS — BP 140/50 | HR 64 | Ht 60.0 in | Wt 170.0 lb

## 2015-08-25 DIAGNOSIS — I6523 Occlusion and stenosis of bilateral carotid arteries: Secondary | ICD-10-CM

## 2015-08-25 DIAGNOSIS — I2583 Coronary atherosclerosis due to lipid rich plaque: Principal | ICD-10-CM

## 2015-08-25 DIAGNOSIS — I2510 Atherosclerotic heart disease of native coronary artery without angina pectoris: Secondary | ICD-10-CM

## 2015-08-25 DIAGNOSIS — R0989 Other specified symptoms and signs involving the circulatory and respiratory systems: Secondary | ICD-10-CM

## 2015-08-25 NOTE — Patient Instructions (Signed)
Medication Instructions:  Your physician recommends that you continue on your current medications as directed. Please refer to the Current Medication list given to you today.  Labwork: NONE  Testing/Procedures: A chest x-ray takes a picture of the organs and structures inside the chest, including the heart, lungs, and blood vessels. This test can show several things, including, whether the heart is enlarges; whether fluid is building up in the lungs; and whether pacemaker / defibrillator leads are still in place.   Follow-Up: Your physician wants you to follow-up in: 6 months with Dr. Nishan. You will receive a reminder letter in the mail two months in advance. If you don't receive a letter, please call our office to schedule the follow-up appointment.   If you need a refill on your cardiac medications before your next appointment, please call your pharmacy.    

## 2015-08-25 NOTE — Addendum Note (Signed)
Addended by: Aris Georgia, Ifeoluwa Beller L on: 08/25/2015 03:25 PM   Modules accepted: Orders

## 2015-08-31 ENCOUNTER — Telehealth: Payer: Self-pay | Admitting: Cardiovascular Disease

## 2015-08-31 NOTE — Telephone Encounter (Signed)
New message    Pt is calling for Dr.Nishans Rn to send her the X-ray report that she states was ordered by Dr.Nishan on 08-25-15

## 2015-08-31 NOTE — Telephone Encounter (Signed)
Called patient back about her question. Sent chest xray to her PCP and will send her a copy through the mail.

## 2015-10-15 ENCOUNTER — Other Ambulatory Visit: Payer: Self-pay | Admitting: Nurse Practitioner

## 2015-10-26 ENCOUNTER — Ambulatory Visit (HOSPITAL_BASED_OUTPATIENT_CLINIC_OR_DEPARTMENT_OTHER): Payer: Medicare Other

## 2015-10-26 ENCOUNTER — Ambulatory Visit (HOSPITAL_BASED_OUTPATIENT_CLINIC_OR_DEPARTMENT_OTHER): Payer: Medicare Other | Admitting: Hematology & Oncology

## 2015-10-26 ENCOUNTER — Other Ambulatory Visit (HOSPITAL_BASED_OUTPATIENT_CLINIC_OR_DEPARTMENT_OTHER): Payer: Medicare Other

## 2015-10-26 ENCOUNTER — Other Ambulatory Visit: Payer: Self-pay | Admitting: Family

## 2015-10-26 VITALS — BP 138/50 | HR 66 | Temp 97.5°F | Resp 16

## 2015-10-26 VITALS — BP 148/50 | HR 55 | Temp 97.8°F | Wt 169.0 lb

## 2015-10-26 DIAGNOSIS — B372 Candidiasis of skin and nail: Secondary | ICD-10-CM

## 2015-10-26 DIAGNOSIS — M793 Panniculitis, unspecified: Secondary | ICD-10-CM

## 2015-10-26 DIAGNOSIS — B379 Candidiasis, unspecified: Secondary | ICD-10-CM

## 2015-10-26 DIAGNOSIS — C8252 Diffuse follicle center lymphoma, intrathoracic lymph nodes: Secondary | ICD-10-CM

## 2015-10-26 DIAGNOSIS — Z5112 Encounter for antineoplastic immunotherapy: Secondary | ICD-10-CM | POA: Diagnosis present

## 2015-10-26 DIAGNOSIS — C8228 Follicular lymphoma grade III, unspecified, lymph nodes of multiple sites: Secondary | ICD-10-CM

## 2015-10-26 DIAGNOSIS — B3789 Other sites of candidiasis: Secondary | ICD-10-CM

## 2015-10-26 DIAGNOSIS — C8203 Follicular lymphoma grade I, intra-abdominal lymph nodes: Secondary | ICD-10-CM

## 2015-10-26 DIAGNOSIS — M818 Other osteoporosis without current pathological fracture: Secondary | ICD-10-CM

## 2015-10-26 DIAGNOSIS — D500 Iron deficiency anemia secondary to blood loss (chronic): Secondary | ICD-10-CM

## 2015-10-26 DIAGNOSIS — T386X5A Adverse effect of antigonadotrophins, antiestrogens, antiandrogens, not elsewhere classified, initial encounter: Secondary | ICD-10-CM

## 2015-10-26 LAB — CMP (CANCER CENTER ONLY)
ALBUMIN: 3.5 g/dL (ref 3.3–5.5)
ALT(SGPT): 12 U/L (ref 10–47)
AST: 22 U/L (ref 11–38)
Alkaline Phosphatase: 45 U/L (ref 26–84)
BILIRUBIN TOTAL: 0.7 mg/dL (ref 0.20–1.60)
BUN, Bld: 15 mg/dL (ref 7–22)
CALCIUM: 8.8 mg/dL (ref 8.0–10.3)
CHLORIDE: 107 meq/L (ref 98–108)
CO2: 25 meq/L (ref 18–33)
Creat: 1 mg/dl (ref 0.6–1.2)
Glucose, Bld: 92 mg/dL (ref 73–118)
Potassium: 3.8 mEq/L (ref 3.3–4.7)
Sodium: 135 mEq/L (ref 128–145)
Total Protein: 5.9 g/dL — ABNORMAL LOW (ref 6.4–8.1)

## 2015-10-26 LAB — LACTATE DEHYDROGENASE: LDH: 180 U/L (ref 125–245)

## 2015-10-26 LAB — CBC WITH DIFFERENTIAL (CANCER CENTER ONLY)
BASO#: 0 10*3/uL (ref 0.0–0.2)
BASO%: 0.7 % (ref 0.0–2.0)
EOS%: 3.1 % (ref 0.0–7.0)
Eosinophils Absolute: 0.2 10*3/uL (ref 0.0–0.5)
HEMATOCRIT: 34.1 % — AB (ref 34.8–46.6)
HEMOGLOBIN: 11.5 g/dL — AB (ref 11.6–15.9)
LYMPH#: 1.3 10*3/uL (ref 0.9–3.3)
LYMPH%: 23.3 % (ref 14.0–48.0)
MCH: 33.5 pg (ref 26.0–34.0)
MCHC: 33.7 g/dL (ref 32.0–36.0)
MCV: 99 fL (ref 81–101)
MONO#: 1 10*3/uL — ABNORMAL HIGH (ref 0.1–0.9)
MONO%: 16.6 % — AB (ref 0.0–13.0)
NEUT#: 3.2 10*3/uL (ref 1.5–6.5)
NEUT%: 56.3 % (ref 39.6–80.0)
Platelets: 149 10*3/uL (ref 145–400)
RBC: 3.43 10*6/uL — AB (ref 3.70–5.32)
RDW: 14.3 % (ref 11.1–15.7)
WBC: 5.7 10*3/uL (ref 3.9–10.0)

## 2015-10-26 MED ORDER — FLUCONAZOLE IN SODIUM CHLORIDE 200-0.9 MG/100ML-% IV SOLN
200.0000 mg | Freq: Once | INTRAVENOUS | Status: AC
  Administered 2015-10-26: 200 mg via INTRAVENOUS
  Filled 2015-10-26: qty 100

## 2015-10-26 MED ORDER — FLUCONAZOLE 100 MG PO TABS: 100.0000 mg | ORAL_TABLET | Freq: Every day | ORAL | Status: DC

## 2015-10-26 MED ORDER — SODIUM CHLORIDE 0.9 % IV SOLN
Freq: Once | INTRAVENOUS | Status: AC
  Administered 2015-10-26: 11:00:00 via INTRAVENOUS

## 2015-10-26 MED ORDER — LIDOCAINE-PRILOCAINE 2.5-2.5 % EX CREA: 1.0000 "application " | TOPICAL_CREAM | CUTANEOUS | Status: DC | PRN

## 2015-10-26 MED ORDER — SODIUM CHLORIDE 0.9 % IV SOLN
375.0000 mg/m2 | Freq: Once | INTRAVENOUS | Status: AC
  Administered 2015-10-26: 700 mg via INTRAVENOUS
  Filled 2015-10-26: qty 60

## 2015-10-26 MED ORDER — HEPARIN SOD (PORK) LOCK FLUSH 100 UNIT/ML IV SOLN
500.0000 [IU] | Freq: Once | INTRAVENOUS | Status: AC | PRN
  Administered 2015-10-26: 500 [IU]
  Filled 2015-10-26: qty 5

## 2015-10-26 MED ORDER — SODIUM CHLORIDE 0.9 % IJ SOLN
10.0000 mL | INTRAMUSCULAR | Status: DC | PRN
  Administered 2015-10-26: 10 mL
  Filled 2015-10-26: qty 10

## 2015-10-26 MED ORDER — ACETAMINOPHEN 325 MG PO TABS
ORAL_TABLET | ORAL | Status: AC
  Filled 2015-10-26: qty 2

## 2015-10-26 MED ORDER — DIPHENHYDRAMINE HCL 25 MG PO CAPS
50.0000 mg | ORAL_CAPSULE | Freq: Once | ORAL | Status: AC
  Administered 2015-10-26: 50 mg via ORAL

## 2015-10-26 MED ORDER — FLUCONAZOLE IN SODIUM CHLORIDE 400-0.9 MG/200ML-% IV SOLN
200.0000 mg | Freq: Once | INTRAVENOUS | Status: DC
  Filled 2015-10-26: qty 200

## 2015-10-26 MED ORDER — DIPHENHYDRAMINE HCL 25 MG PO CAPS
ORAL_CAPSULE | ORAL | Status: AC
  Filled 2015-10-26: qty 2

## 2015-10-26 MED ORDER — ACETAMINOPHEN 325 MG PO TABS
650.0000 mg | ORAL_TABLET | Freq: Once | ORAL | Status: AC
  Administered 2015-10-26: 650 mg via ORAL

## 2015-10-26 NOTE — Patient Instructions (Signed)
[facility] Discharge Instructions for Patients Receiving Chemotherapy  Today you received the following chemotherapy agents Rituxan  To help prevent nausea and vomiting after your treatment, we encourage you to take your nausea medication    If you develop nausea and vomiting that is not controlled by your nausea medication, call the clinic.   BELOW ARE SYMPTOMS THAT SHOULD BE REPORTED IMMEDIATELY:  *FEVER GREATER THAN 100.5 F  *CHILLS WITH OR WITHOUT FEVER  NAUSEA AND VOMITING THAT IS NOT CONTROLLED WITH YOUR NAUSEA MEDICATION  *UNUSUAL SHORTNESS OF BREATH  *UNUSUAL BRUISING OR BLEEDING  TENDERNESS IN MOUTH AND THROAT WITH OR WITHOUT PRESENCE OF ULCERS  *URINARY PROBLEMS  *BOWEL PROBLEMS  UNUSUAL RASH Items with * indicate a potential emergency and should be followed up as soon as possible.  Feel free to call the clinic you have any questions or concerns. The clinic phone number is [phone].  Please show the CHEMO ALERT CARD at check-in to the Emergency Department and triage nurse.   

## 2015-10-26 NOTE — Progress Notes (Signed)
Hematology and Oncology Follow Up Visit  Makayla Fisher [id number] 08-14-1922 82 y.o. 10/26/2015   Principle Diagnosis:  Follicular large cell non-Hodgkin's lymphoma Iron deficiency anemia secondary to malabsorption  Current Therapy:   Status post cycle 5 of maintenance Rituxan IV iron-Feraheme as indicated     Interim History:  Makayla Fisher is back for followup. She is doing okay. Her main problem however is a fatty she has a bad Candida rash on her anterior abdominal wall. She has a couple pannus. The Candida appears to be associated with these and also with her colostomy bag.  She has had no fever. She is not doing any physical therapy right now. She is getting around with a rolling walker.  She has had no nausea or vomiting. There's been no abdominal pain. Her ostomy is functioning well.   She has had no leg swelling.  There's been no cough. She's had no increased shortness of breath.  Overall, her performance status is ECOG 1-2.  Medications:  Current outpatient prescriptions:  .  amLODipine (NORVASC) 5 MG tablet, Take 5 mg by mouth every morning. , Disp: , Rfl:  .  aspirin EC 81 MG tablet, Take 81 mg by mouth daily., Disp: , Rfl:  .  Calcium Carb-Cholecalciferol (CALCIUM-VITAMIN D) 600-400 MG-UNIT TABS, Take 1 tablet by mouth daily., Disp: , Rfl:  .  Cranberry 425 MG CAPS, Take 1 capsule by mouth daily., Disp: , Rfl:  .  ergocalciferol (VITAMIN D2) 50000 UNITS capsule, Take 1 capsule (50,000 Units total) by mouth once a week., Disp: 12 capsule, Rfl: 4 .  famotidine (PEPCID) 20 MG tablet, Take 1 tablet (20 mg total) by mouth 2 (two) times daily., Disp: 60 tablet, Rfl: 6 .  furosemide (LASIX) 20 MG tablet, Take 20 mg by mouth daily. , Disp: , Rfl:  .  levothyroxine (SYNTHROID, LEVOTHROID) 50 MCG tablet, Take 50 mcg by mouth every morning., Disp: , Rfl:  .  lidocaine-prilocaine (EMLA) cream, Apply 1 application topically as needed (for port access)., Disp: , Rfl:  .   lisinopril (PRINIVIL,ZESTRIL) 20 MG tablet, Take 20 mg by mouth 2 (two) times daily., Disp: , Rfl:  .  LORazepam (ATIVAN) 0.5 MG tablet, Take 0.5 mg by mouth daily as needed for anxiety., Disp: , Rfl:  .  Multiple Vitamin (MULTIVITAMIN WITH MINERALS) TABS tablet, Take 1 tablet by mouth daily., Disp: , Rfl:  .  nystatin cream (MYCOSTATIN), Apply 1 application topically daily as needed for dry skin., Disp: , Rfl:  .  ondansetron (ZOFRAN) 8 MG tablet, Take 1 tablet (8 mg total) by mouth 2 (two) times daily. For 4 days following chemo, Disp: 20 tablet, Rfl: 2 .  potassium chloride (K-DUR) 10 MEQ tablet, Take 10 mEq by mouth daily., Disp: , Rfl:  .  rOPINIRole (REQUIP) 1 MG tablet, Take 1 mg by mouth at bedtime., Disp: , Rfl:  .  sertraline (ZOLOFT) 25 MG tablet, Take 25 mg by mouth every morning. , Disp: , Rfl:  .  SPIRIVA HANDIHALER 18 MCG inhalation capsule, Place 18 mcg into inhaler and inhale as needed., Disp: , Rfl:  .  vitamin C (ASCORBIC ACID) 500 MG tablet, Take 500 mg by mouth daily., Disp: , Rfl:  .  levothyroxine (SYNTHROID, LEVOTHROID) 75 MCG tablet, Take 75 mcg by mouth daily before breakfast. Reported on 10/26/2015, Disp: , Rfl:   Allergies:  Allergies  Allergen Reactions  . Epipen [Epinephrine Hcl]     Heart palpitations  . Heparin  Unsure of side affects (maybe affected platelets per pt)  . Levaquin [Levofloxacin In D5w] Other (See Comments)    Weakness  . Chlorhexidine Gluconate Other (See Comments)    Pt declined use and suspects she may have an intolerance,   . Sulfa Antibiotics Rash    Past Medical History, Surgical history, Social history, and Family History were reviewed and updated.  Review of Systems: As above  Physical Exam:  weight is 169 lb (76.658 kg). Her oral temperature is 97.8 F (36.6 C). Her blood pressure is 148/50 and her pulse is 55.   Elderly white female. Head and neck exam shows no ocular or oral lesions. There are no palpable cervical or  supraclavicular lymph nodes. Lungs are clear bilaterally. Cardiac exam regular rate and rhythm with no murmurs rubs or bruits. Abdomen is soft. She has good bowel sounds. The ostomy is intact. She has a lot of erythema associated with her pannus. This is consistent with Candida of the skin. There is no stool in the ostomy bag. There is no palpable liver or spleen tip. Her laparotomy scars are well-healed. Her colostomy is in the left lower quadrant. Back exam shows no tenderness over the spine ribs or hips. Extremities shows no clubbing cyanosis or edema. Skin exam no rashes, ecchymoses or petechia. She does have some areas of Candida in the skin folds of her abdomen neurological exam is nonfocal..   Lab Results  Component Value Date   WBC 5.7 10/26/2015   HGB 11.5* 10/26/2015   HCT 34.1* 10/26/2015   MCV 99 10/26/2015   PLT 149 10/26/2015     Chemistry      Component Value Date/Time   NA 141 07/27/2015 0938   NA 141 06/11/2014 1052   NA 141 05/08/2014 1041   K 3.8 07/27/2015 0938   K 4.9 06/11/2014 1052   K 4.5 05/08/2014 1041   CL 106 07/27/2015 0938   CL 110 06/11/2014 1052   CO2 23 07/27/2015 0938   CO2 25 06/11/2014 1052   CO2 24 05/08/2014 1041   BUN 16 07/27/2015 0938   BUN 14 06/11/2014 1052   BUN 14.2 05/08/2014 1041   CREATININE 0.6 07/27/2015 0938   CREATININE 0.77 06/11/2014 1052   CREATININE 0.8 05/08/2014 1041      Component Value Date/Time   CALCIUM 9.3 07/27/2015 0938   CALCIUM 9.5 06/11/2014 1052   CALCIUM 9.2 05/08/2014 1041   ALKPHOS 56 07/27/2015 0938   ALKPHOS 66 05/08/2014 1041   ALKPHOS 90 09/25/2013 1141   AST 27 07/27/2015 0938   AST 23 05/08/2014 1041   AST 15 09/25/2013 1141   ALT 21 07/27/2015 0938   ALT 10 05/08/2014 1041   ALT <8 09/25/2013 1141   BILITOT 0.60 07/27/2015 0938   BILITOT 0.42 05/08/2014 1041   BILITOT 0.4 09/25/2013 1141         Impression and Plan: Makayla Fisher is 82 year old female. She has a follicular large cell  lymphoma. I do not see any evidence of recurrent disease. I suppose that she is at high risk for occurrence. Her labs look really good.  I do not see a need for any scans right now.  She does have a fairly bad Candida infection on her anterior abdominal wall. This is because of the pannus that she has on her abdomen.  I think we need to give her dose of IV Diflucan today and then I will put on some oral Diflucan.  Overall, I think  she really is doing quite well. Her quality of life is about as good as she would like it to be  I will continue with the Rituxan.   She does have a Port-A-Cath that we flushed whenever we see her.  We will plan to get her back here in 3 months.   [name], MD 7/3/20179:52 AM

## 2015-10-27 LAB — VITAMIN D 25 HYDROXY (VIT D DEFICIENCY, FRACTURES): Vitamin D, 25-Hydroxy: 41.3 ng/mL (ref 30.0–100.0)

## 2016-01-19 ENCOUNTER — Other Ambulatory Visit: Payer: Self-pay | Admitting: Family Medicine

## 2016-01-19 DIAGNOSIS — Z1231 Encounter for screening mammogram for malignant neoplasm of breast: Secondary | ICD-10-CM

## 2016-01-25 ENCOUNTER — Other Ambulatory Visit (HOSPITAL_BASED_OUTPATIENT_CLINIC_OR_DEPARTMENT_OTHER): Payer: Medicare Other

## 2016-01-25 ENCOUNTER — Ambulatory Visit (HOSPITAL_BASED_OUTPATIENT_CLINIC_OR_DEPARTMENT_OTHER): Payer: Medicare Other | Admitting: Hematology & Oncology

## 2016-01-25 ENCOUNTER — Encounter: Payer: Self-pay | Admitting: Hematology & Oncology

## 2016-01-25 ENCOUNTER — Ambulatory Visit (HOSPITAL_BASED_OUTPATIENT_CLINIC_OR_DEPARTMENT_OTHER): Payer: Medicare Other

## 2016-01-25 VITALS — BP 140/37 | HR 58 | Temp 97.3°F | Resp 18 | Ht 60.0 in | Wt 167.0 lb

## 2016-01-25 VITALS — BP 139/42 | HR 60 | Temp 97.6°F | Resp 18

## 2016-01-25 DIAGNOSIS — Z5112 Encounter for antineoplastic immunotherapy: Secondary | ICD-10-CM | POA: Diagnosis present

## 2016-01-25 DIAGNOSIS — D500 Iron deficiency anemia secondary to blood loss (chronic): Secondary | ICD-10-CM

## 2016-01-25 DIAGNOSIS — C8222 Follicular lymphoma grade III, unspecified, intrathoracic lymph nodes: Secondary | ICD-10-CM

## 2016-01-25 DIAGNOSIS — C8203 Follicular lymphoma grade I, intra-abdominal lymph nodes: Secondary | ICD-10-CM | POA: Diagnosis not present

## 2016-01-25 DIAGNOSIS — M793 Panniculitis, unspecified: Principal | ICD-10-CM

## 2016-01-25 DIAGNOSIS — B379 Candidiasis, unspecified: Secondary | ICD-10-CM

## 2016-01-25 DIAGNOSIS — C8252 Diffuse follicle center lymphoma, intrathoracic lymph nodes: Secondary | ICD-10-CM

## 2016-01-25 LAB — CBC WITH DIFFERENTIAL (CANCER CENTER ONLY)
BASO#: 0.1 10*3/uL (ref 0.0–0.2)
BASO%: 1 % (ref 0.0–2.0)
EOS%: 3.4 % (ref 0.0–7.0)
Eosinophils Absolute: 0.2 10*3/uL (ref 0.0–0.5)
HCT: 35.6 % (ref 34.8–46.6)
HGB: 11.8 g/dL (ref 11.6–15.9)
LYMPH#: 1.5 10*3/uL (ref 0.9–3.3)
LYMPH%: 30.3 % (ref 14.0–48.0)
MCH: 33 pg (ref 26.0–34.0)
MCHC: 33.1 g/dL (ref 32.0–36.0)
MCV: 99 fL (ref 81–101)
MONO#: 0.8 10*3/uL (ref 0.1–0.9)
MONO%: 15.9 % — AB (ref 0.0–13.0)
NEUT#: 2.5 10*3/uL (ref 1.5–6.5)
NEUT%: 49.4 % (ref 39.6–80.0)
PLATELETS: 140 10*3/uL — AB (ref 145–400)
RBC: 3.58 10*6/uL — AB (ref 3.70–5.32)
RDW: 13.4 % (ref 11.1–15.7)
WBC: 5 10*3/uL (ref 3.9–10.0)

## 2016-01-25 LAB — IRON AND TIBC
%SAT: 23 % (ref 21–57)
Iron: 57 ug/dL (ref 41–142)
TIBC: 244 ug/dL (ref 236–444)
UIBC: 187 ug/dL (ref 120–384)

## 2016-01-25 LAB — LACTATE DEHYDROGENASE: LDH: 251 U/L — ABNORMAL HIGH (ref 125–245)

## 2016-01-25 LAB — CMP (CANCER CENTER ONLY)
ALK PHOS: 53 U/L (ref 26–84)
ALT: 14 U/L (ref 10–47)
AST: 26 U/L (ref 11–38)
Albumin: 3.9 g/dL (ref 3.3–5.5)
BILIRUBIN TOTAL: 0.6 mg/dL (ref 0.20–1.60)
BUN: 17 mg/dL (ref 7–22)
CALCIUM: 9.2 mg/dL (ref 8.0–10.3)
CO2: 25 meq/L (ref 18–33)
CREATININE: 1.1 mg/dL (ref 0.6–1.2)
Chloride: 104 mEq/L (ref 98–108)
GLUCOSE: 101 mg/dL (ref 73–118)
Potassium: 4.2 mEq/L (ref 3.3–4.7)
SODIUM: 134 meq/L (ref 128–145)
Total Protein: 6.3 g/dL — ABNORMAL LOW (ref 6.4–8.1)

## 2016-01-25 LAB — FERRITIN: Ferritin: 241 ng/ml (ref 9–269)

## 2016-01-25 MED ORDER — SODIUM CHLORIDE 0.9 % IV SOLN
Freq: Once | INTRAVENOUS | Status: AC
  Administered 2016-01-25: 10:00:00 via INTRAVENOUS

## 2016-01-25 MED ORDER — ACETAMINOPHEN 325 MG PO TABS
ORAL_TABLET | ORAL | Status: AC
  Filled 2016-01-25: qty 2

## 2016-01-25 MED ORDER — HEPARIN SOD (PORK) LOCK FLUSH 100 UNIT/ML IV SOLN
500.0000 [IU] | Freq: Once | INTRAVENOUS | Status: AC | PRN
  Administered 2016-01-25: 500 [IU]
  Filled 2016-01-25: qty 5

## 2016-01-25 MED ORDER — DIPHENHYDRAMINE HCL 25 MG PO CAPS
50.0000 mg | ORAL_CAPSULE | Freq: Once | ORAL | Status: AC
  Administered 2016-01-25: 50 mg via ORAL

## 2016-01-25 MED ORDER — SODIUM CHLORIDE 0.9 % IV SOLN
375.0000 mg/m2 | Freq: Once | INTRAVENOUS | Status: AC
  Administered 2016-01-25: 700 mg via INTRAVENOUS
  Filled 2016-01-25: qty 50

## 2016-01-25 MED ORDER — SODIUM CHLORIDE 0.9 % IJ SOLN
10.0000 mL | INTRAMUSCULAR | Status: DC | PRN
  Administered 2016-01-25: 10 mL
  Filled 2016-01-25: qty 10

## 2016-01-25 MED ORDER — DIPHENHYDRAMINE HCL 25 MG PO CAPS
ORAL_CAPSULE | ORAL | Status: AC
  Filled 2016-01-25: qty 2

## 2016-01-25 MED ORDER — ACETAMINOPHEN 325 MG PO TABS
650.0000 mg | ORAL_TABLET | Freq: Once | ORAL | Status: AC
  Administered 2016-01-25: 650 mg via ORAL

## 2016-01-25 NOTE — Patient Instructions (Signed)
Rituximab injection What is this medicine? RITUXIMAB (ri TUX i mab) is a monoclonal antibody. It is used commonly to treat non-Hodgkin lymphoma and other conditions. It is also used to treat rheumatoid arthritis (RA). In RA, this medicine slows the inflammatory process and help reduce joint pain and swelling. This medicine is often used with other cancer or arthritis medications. This medicine may be used for other purposes; ask your health care provider or pharmacist if you have questions. What should I tell my health care provider before I take this medicine? They need to know if you have any of these conditions: -blood disorders -heart disease -history of hepatitis B -infection (especially a virus infection such as chickenpox, cold sores, or herpes) -irregular heartbeat -kidney disease -lung or breathing disease, like asthma -lupus -an unusual or allergic reaction to rituximab, mouse proteins, other medicines, foods, dyes, or preservatives -pregnant or trying to get pregnant -breast-feeding How should I use this medicine? This medicine is for infusion into a vein. It is administered in a hospital or clinic by a specially trained health care professional. A special MedGuide will be given to you by the pharmacist with each prescription and refill. Be sure to read this information carefully each time. Talk to your pediatrician regarding the use of this medicine in children. This medicine is not approved for use in children. Overdosage: If you think you have taken too much of this medicine contact a poison control center or emergency room at once. NOTE: This medicine is only for you. Do not share this medicine with others. What if I miss a dose? It is important not to miss a dose. Call your doctor or health care professional if you are unable to keep an appointment. What may interact with this medicine? -cisplatin -medicines for blood pressure -some other medicines for  arthritis -vaccines This list may not describe all possible interactions. Give your health care provider a list of all the medicines, herbs, non-prescription drugs, or dietary supplements you use. Also tell them if you smoke, drink alcohol, or use illegal drugs. Some items may interact with your medicine. What should I watch for while using this medicine? Report any side effects that you notice during your treatment right away, such as changes in your breathing, fever, chills, dizziness or lightheadedness. These effects are more common with the first dose. Visit your prescriber or health care professional for checks on your progress. You will need to have regular blood work. Report any other side effects. The side effects of this medicine can continue after you finish your treatment. Continue your course of treatment even though you feel ill unless your doctor tells you to stop. Call your doctor or health care professional for advice if you get a fever, chills or sore throat, or other symptoms of a cold or flu. Do not treat yourself. This drug decreases your body's ability to fight infections. Try to avoid being around people who are sick. This medicine may increase your risk to bruise or bleed. Call your doctor or health care professional if you notice any unusual bleeding. Be careful brushing and flossing your teeth or using a toothpick because you may get an infection or bleed more easily. If you have any dental work done, tell your dentist you are receiving this medicine. Avoid taking products that contain aspirin, acetaminophen, ibuprofen, naproxen, or ketoprofen unless instructed by your doctor. These medicines may hide a fever. Do not become pregnant while taking this medicine. Women should inform their doctor if   they wish to become pregnant or think they might be pregnant. There is a potential for serious side effects to an unborn child. Talk to your health care professional or pharmacist for more  information. Do not breast-feed an infant while taking this medicine. What side effects may I notice from receiving this medicine? Side effects that you should report to your doctor or health care professional as soon as possible: -allergic reactions like skin rash, itching or hives, swelling of the face, lips, or tongue -low blood counts - this medicine may decrease the number of white blood cells, red blood cells and platelets. You may be at increased risk for infections and bleeding. -signs of infection - fever or chills, cough, sore throat, pain or difficulty passing urine -signs of decreased platelets or bleeding - bruising, pinpoint red spots on the skin, black, tarry stools, blood in the urine -signs of decreased red blood cells - unusually weak or tired, fainting spells, lightheadedness -breathing problems -confused, not responsive -chest pain -fast, irregular heartbeat -feeling faint or lightheaded, falls -mouth sores -redness, blistering, peeling or loosening of the skin, including inside the mouth -stomach pain -swelling of the ankles, feet, or hands -trouble passing urine or change in the amount of urine Side effects that usually do not require medical attention (report to your doctor or other health care professional if they continue or are bothersome): -anxiety -headache -loss of appetite -muscle aches -nausea -night sweats This list may not describe all possible side effects. Call your doctor for medical advice about side effects. You may report side effects to FDA at 1-800-FDA-1088. Where should I keep my medicine? This drug is given in a hospital or clinic and will not be stored at home. NOTE: This sheet is a summary. It may not cover all possible information. If you have questions about this medicine, talk to your doctor, pharmacist, or health care provider.    2016, Elsevier/Gold Standard. (2014-06-18 22:30:56)  

## 2016-01-25 NOTE — Progress Notes (Signed)
Hematology and Oncology Follow Up Visit  Makayla Fisher [id number] [birth year] 82 y.o. 01/25/2016   Principle Diagnosis:  Follicular large cell non-Hodgkin's lymphoma Iron deficiency anemia secondary to malabsorption  Current Therapy:   Status post cycle 6 of maintenance Rituxan - complete in June 2018 IV iron-Feraheme as indicated     Interim History:  Makayla Fisher is back for followup. She is doing okay. She has no specific complaints. She has had a very nice clearance of the candida skin infection.  She's had no nausea or vomiting. Her colostomy is working well. Apparently, the doctor at the nursing home put her on some oral iron. We will check her iron studies.  She's had no issues with rashes. She's had no leg swelling. She's had no fever.  She's had no mouth sores.  She's had no cough or shortness of breath.   Overall, her performance status is ECOG 1-2.  Medications:  Current Outpatient Prescriptions:  .  amLODipine (NORVASC) 5 MG tablet, Take 5 mg by mouth every morning. , Disp: , Rfl:  .  aspirin EC 81 MG tablet, Take 81 mg by mouth daily., Disp: , Rfl:  .  Calcium Carb-Cholecalciferol (CALCIUM-VITAMIN D) 600-400 MG-UNIT TABS, Take 1 tablet by mouth daily., Disp: , Rfl:  .  Cranberry 425 MG CAPS, Take 1 capsule by mouth daily., Disp: , Rfl:  .  ergocalciferol (VITAMIN D2) 50000 UNITS capsule, Take 1 capsule (50,000 Units total) by mouth once a week., Disp: 12 capsule, Rfl: 4 .  famotidine (PEPCID) 20 MG tablet, Take 1 tablet (20 mg total) by mouth 2 (two) times daily., Disp: 60 tablet, Rfl: 6 .  fluconazole (DIFLUCAN) 100 MG tablet, Take 1 tablet (100 mg total) by mouth daily., Disp: 30 tablet, Rfl: 2 .  furosemide (LASIX) 20 MG tablet, Take 20 mg by mouth daily. , Disp: , Rfl:  .  levothyroxine (SYNTHROID, LEVOTHROID) 50 MCG tablet, Take 50 mcg by mouth every morning., Disp: , Rfl:  .  levothyroxine (SYNTHROID, LEVOTHROID) 75 MCG tablet, Take 75 mcg by mouth daily  before breakfast. Reported on 10/26/2015, Disp: , Rfl:  .  lidocaine-prilocaine (EMLA) cream, Apply 1 application topically as needed (for port access)., Disp: 30 g, Rfl: 6 .  lisinopril (PRINIVIL,ZESTRIL) 20 MG tablet, Take 20 mg by mouth 2 (two) times daily., Disp: , Rfl:  .  LORazepam (ATIVAN) 0.5 MG tablet, Take 0.5 mg by mouth daily as needed for anxiety., Disp: , Rfl:  .  Multiple Vitamin (MULTIVITAMIN WITH MINERALS) TABS tablet, Take 1 tablet by mouth daily., Disp: , Rfl:  .  nystatin cream (MYCOSTATIN), Apply 1 application topically daily as needed for dry skin., Disp: , Rfl:  .  ondansetron (ZOFRAN) 8 MG tablet, Take 1 tablet (8 mg total) by mouth 2 (two) times daily. For 4 days following chemo, Disp: 20 tablet, Rfl: 2 .  potassium chloride (K-DUR) 10 MEQ tablet, Take 10 mEq by mouth daily., Disp: , Rfl:  .  rOPINIRole (REQUIP) 1 MG tablet, Take 1 mg by mouth at bedtime., Disp: , Rfl:  .  sertraline (ZOLOFT) 25 MG tablet, Take 25 mg by mouth every morning. , Disp: , Rfl:  .  SPIRIVA HANDIHALER 18 MCG inhalation capsule, Place 18 mcg into inhaler and inhale as needed., Disp: , Rfl:  .  vitamin C (ASCORBIC ACID) 500 MG tablet, Take 500 mg by mouth daily., Disp: , Rfl:   Allergies:  Allergies  Allergen Reactions  . Epipen [Epinephrine Hcl]  Heart palpitations  . Heparin     Unsure of side affects (maybe affected platelets per pt)  . Levaquin [Levofloxacin In D5w] Other (See Comments)    Weakness  . Chlorhexidine Gluconate Other (See Comments)    Pt declined use and suspects she may have an intolerance,   . Sulfa Antibiotics Rash    Past Medical History, Surgical history, Social history, and Family History were reviewed and updated.  Review of Systems: As above  Physical Exam:  height is 5' (1.524 m) and weight is 167 lb (75.8 kg). Her oral temperature is 97.3 F (36.3 C). Her blood pressure is 140/37 (abnormal) and her pulse is 58 (abnormal). Her respiration is 18.    Elderly white female. Head and neck exam shows no ocular or oral lesions. There are no palpable cervical or supraclavicular lymph nodes. Lungs are clear bilaterally. Cardiac exam regular rate and rhythm with no murmurs rubs or bruits. Abdomen is soft. She has good bowel sounds. The ostomy is intact. There is no stool in the ostomy bag. There is no palpable liver or spleen tip. Her laparotomy scars are well-healed. Her colostomy is in the left lower quadrant. Breast exam shows no bilateral breast masses. She has no bilateral axillary adenopathy. Back exam shows no tenderness over the spine ribs or hips. Extremities shows no clubbing cyanosis or edema.  neurological exam is nonfocal..   Lab Results  Component Value Date   WBC 5.7 10/26/2015   HGB 11.5 (L) 10/26/2015   HCT 34.1 (L) 10/26/2015   MCV 99 10/26/2015   PLT 149 10/26/2015     Chemistry      Component Value Date/Time   NA 135 10/26/2015 0851   NA 141 05/08/2014 1041   K 3.8 10/26/2015 0851   K 4.5 05/08/2014 1041   CL 107 10/26/2015 0851   CO2 25 10/26/2015 0851   CO2 24 05/08/2014 1041   BUN 15 10/26/2015 0851   BUN 14.2 05/08/2014 1041   CREATININE 1.0 10/26/2015 0851   CREATININE 0.8 05/08/2014 1041      Component Value Date/Time   CALCIUM 8.8 10/26/2015 0851   CALCIUM 9.2 05/08/2014 1041   ALKPHOS 45 10/26/2015 0851   ALKPHOS 66 05/08/2014 1041   AST 22 10/26/2015 0851   AST 23 05/08/2014 1041   ALT 12 10/26/2015 0851   ALT 10 05/08/2014 1041   BILITOT 0.70 10/26/2015 0851   BILITOT 0.42 05/08/2014 1041         Impression and Plan: Makayla Fisher is 82 year old female. She has a follicular large cell lymphoma. I do not see any evidence of recurrent disease. I suppose that she is at high risk for occurrence. Her labs look really good.  I do not see a need for any scans right now.  We will finish up her Rituxan in June 2018.   I will plan to get her back in December. I think this would be reasonable.    We will also order iron studies on her.   Makayla Napoleon, MD 10/2/20179:42 AM

## 2016-01-26 ENCOUNTER — Telehealth: Payer: Self-pay | Admitting: *Deleted

## 2016-01-26 NOTE — Telephone Encounter (Addendum)
Left message on personal voice mail  ----- Message from [name], MD sent at 01/25/2016  5:06 PM EDT ----- Call - the iron level is ok!!  pete

## 2016-01-28 ENCOUNTER — Ambulatory Visit: Payer: Medicare Other

## 2016-02-08 ENCOUNTER — Telehealth: Payer: Self-pay | Admitting: *Deleted

## 2016-02-08 NOTE — Telephone Encounter (Signed)
Patient wants to know if she should continue her oral iron supplement.  Spoke to Dr Marin Olp who wants patient to discontinue oral iron. Order faxed to [facility] and patient aware.

## 2016-02-16 ENCOUNTER — Ambulatory Visit
Admission: RE | Admit: 2016-02-16 | Discharge: 2016-02-16 | Disposition: A | Discharge status: Home / Self Care | Payer: Medicare Other | Source: Ambulatory Visit | Attending: Family Medicine | Admitting: Family Medicine

## 2016-02-16 DIAGNOSIS — Z1231 Encounter for screening mammogram for malignant neoplasm of breast: Secondary | ICD-10-CM

## 2016-04-22 ENCOUNTER — Ambulatory Visit (HOSPITAL_BASED_OUTPATIENT_CLINIC_OR_DEPARTMENT_OTHER): Payer: Medicare Other | Admitting: Hematology & Oncology

## 2016-04-22 ENCOUNTER — Ambulatory Visit (HOSPITAL_BASED_OUTPATIENT_CLINIC_OR_DEPARTMENT_OTHER): Payer: Medicare Other

## 2016-04-22 ENCOUNTER — Other Ambulatory Visit (HOSPITAL_BASED_OUTPATIENT_CLINIC_OR_DEPARTMENT_OTHER): Payer: Medicare Other

## 2016-04-22 ENCOUNTER — Ambulatory Visit: Payer: Medicare Other

## 2016-04-22 VITALS — BP 138/45 | HR 62 | Temp 98.0°F | Resp 20 | Wt 161.1 lb

## 2016-04-22 VITALS — BP 136/43 | HR 59 | Temp 98.0°F | Resp 20

## 2016-04-22 DIAGNOSIS — Z5112 Encounter for antineoplastic immunotherapy: Secondary | ICD-10-CM | POA: Diagnosis present

## 2016-04-22 DIAGNOSIS — C8222 Follicular lymphoma grade III, unspecified, intrathoracic lymph nodes: Secondary | ICD-10-CM

## 2016-04-22 DIAGNOSIS — C8233 Follicular lymphoma grade IIIa, intra-abdominal lymph nodes: Secondary | ICD-10-CM | POA: Insufficient documentation

## 2016-04-22 DIAGNOSIS — D500 Iron deficiency anemia secondary to blood loss (chronic): Secondary | ICD-10-CM | POA: Diagnosis present

## 2016-04-22 DIAGNOSIS — C8203 Follicular lymphoma grade I, intra-abdominal lymph nodes: Secondary | ICD-10-CM

## 2016-04-22 LAB — CMP (CANCER CENTER ONLY)
ALT(SGPT): 16 U/L (ref 10–47)
AST: 28 U/L (ref 11–38)
Albumin: 3.7 g/dL (ref 3.3–5.5)
Alkaline Phosphatase: 85 U/L — ABNORMAL HIGH (ref 26–84)
BUN: 12 mg/dL (ref 7–22)
CHLORIDE: 106 meq/L (ref 98–108)
CO2: 25 mEq/L (ref 18–33)
Calcium: 9.3 mg/dL (ref 8.0–10.3)
Creat: 0.7 mg/dl (ref 0.6–1.2)
Glucose, Bld: 111 mg/dL (ref 73–118)
POTASSIUM: 3.8 meq/L (ref 3.3–4.7)
Sodium: 144 mEq/L (ref 128–145)
TOTAL PROTEIN: 6.1 g/dL — AB (ref 6.4–8.1)
Total Bilirubin: 0.7 mg/dl (ref 0.20–1.60)

## 2016-04-22 LAB — CBC WITH DIFFERENTIAL (CANCER CENTER ONLY)
BASO#: 0 10*3/uL (ref 0.0–0.2)
BASO%: 0.4 % (ref 0.0–2.0)
EOS ABS: 0.2 10*3/uL (ref 0.0–0.5)
EOS%: 2.4 % (ref 0.0–7.0)
HCT: 35.4 % (ref 34.8–46.6)
HGB: 11.9 g/dL (ref 11.6–15.9)
LYMPH#: 1.3 10*3/uL (ref 0.9–3.3)
LYMPH%: 18.7 % (ref 14.0–48.0)
MCH: 32.7 pg (ref 26.0–34.0)
MCHC: 33.6 g/dL (ref 32.0–36.0)
MCV: 97 fL (ref 81–101)
MONO#: 1.1 10*3/uL — AB (ref 0.1–0.9)
MONO%: 15.9 % — ABNORMAL HIGH (ref 0.0–13.0)
NEUT#: 4.4 10*3/uL (ref 1.5–6.5)
NEUT%: 62.6 % (ref 39.6–80.0)
PLATELETS: 169 10*3/uL (ref 145–400)
RBC: 3.64 10*6/uL — AB (ref 3.70–5.32)
RDW: 13.5 % (ref 11.1–15.7)
WBC: 7 10*3/uL (ref 3.9–10.0)

## 2016-04-22 LAB — LACTATE DEHYDROGENASE: LDH: 225 U/L (ref 125–245)

## 2016-04-22 MED ORDER — HEPARIN SOD (PORK) LOCK FLUSH 100 UNIT/ML IV SOLN
500.0000 [IU] | Freq: Once | INTRAVENOUS | Status: AC | PRN
  Administered 2016-04-22: 500 [IU]
  Filled 2016-04-22: qty 5

## 2016-04-22 MED ORDER — DIPHENHYDRAMINE HCL 25 MG PO CAPS
ORAL_CAPSULE | ORAL | Status: AC
  Filled 2016-04-22: qty 2

## 2016-04-22 MED ORDER — SODIUM CHLORIDE 0.9 % IV SOLN
375.0000 mg/m2 | Freq: Once | INTRAVENOUS | Status: AC
  Administered 2016-04-22: 700 mg via INTRAVENOUS
  Filled 2016-04-22: qty 50

## 2016-04-22 MED ORDER — ACETAMINOPHEN 325 MG PO TABS
650.0000 mg | ORAL_TABLET | Freq: Once | ORAL | Status: AC
  Administered 2016-04-22: 650 mg via ORAL

## 2016-04-22 MED ORDER — DIPHENHYDRAMINE HCL 25 MG PO CAPS
50.0000 mg | ORAL_CAPSULE | Freq: Once | ORAL | Status: AC
  Administered 2016-04-22: 50 mg via ORAL

## 2016-04-22 MED ORDER — SODIUM CHLORIDE 0.9 % IV SOLN
Freq: Once | INTRAVENOUS | Status: AC
  Administered 2016-04-22: 11:00:00 via INTRAVENOUS

## 2016-04-22 MED ORDER — SODIUM CHLORIDE 0.9 % IJ SOLN
10.0000 mL | INTRAMUSCULAR | Status: DC | PRN
  Administered 2016-04-22: 10 mL
  Filled 2016-04-22: qty 10

## 2016-04-22 MED ORDER — ACETAMINOPHEN 325 MG PO TABS
ORAL_TABLET | ORAL | Status: AC
  Filled 2016-04-22: qty 2

## 2016-04-22 NOTE — Patient Instructions (Signed)
Rituximab injection What is this medicine? RITUXIMAB (ri TUX i mab) is a monoclonal antibody. It is used commonly to treat non-Hodgkin lymphoma and other conditions. It is also used to treat rheumatoid arthritis (RA). In RA, this medicine slows the inflammatory process and help reduce joint pain and swelling. This medicine is often used with other cancer or arthritis medications. This medicine may be used for other purposes; ask your health care provider or pharmacist if you have questions. What should I tell my health care provider before I take this medicine? They need to know if you have any of these conditions: -blood disorders -heart disease -history of hepatitis B -infection (especially a virus infection such as chickenpox, cold sores, or herpes) -irregular heartbeat -kidney disease -lung or breathing disease, like asthma -lupus -an unusual or allergic reaction to rituximab, mouse proteins, other medicines, foods, dyes, or preservatives -pregnant or trying to get pregnant -breast-feeding How should I use this medicine? This medicine is for infusion into a vein. It is administered in a hospital or clinic by a specially trained health care professional. A special MedGuide will be given to you by the pharmacist with each prescription and refill. Be sure to read this information carefully each time. Talk to your pediatrician regarding the use of this medicine in children. This medicine is not approved for use in children. Overdosage: If you think you have taken too much of this medicine contact a poison control center or emergency room at once. NOTE: This medicine is only for you. Do not share this medicine with others. What if I miss a dose? It is important not to miss a dose. Call your doctor or health care professional if you are unable to keep an appointment. What may interact with this medicine? -cisplatin -medicines for blood pressure -some other medicines for  arthritis -vaccines This list may not describe all possible interactions. Give your health care provider a list of all the medicines, herbs, non-prescription drugs, or dietary supplements you use. Also tell them if you smoke, drink alcohol, or use illegal drugs. Some items may interact with your medicine. What should I watch for while using this medicine? Report any side effects that you notice during your treatment right away, such as changes in your breathing, fever, chills, dizziness or lightheadedness. These effects are more common with the first dose. Visit your prescriber or health care professional for checks on your progress. You will need to have regular blood work. Report any other side effects. The side effects of this medicine can continue after you finish your treatment. Continue your course of treatment even though you feel ill unless your doctor tells you to stop. Call your doctor or health care professional for advice if you get a fever, chills or sore throat, or other symptoms of a cold or flu. Do not treat yourself. This drug decreases your body's ability to fight infections. Try to avoid being around people who are sick. This medicine may increase your risk to bruise or bleed. Call your doctor or health care professional if you notice any unusual bleeding. Be careful brushing and flossing your teeth or using a toothpick because you may get an infection or bleed more easily. If you have any dental work done, tell your dentist you are receiving this medicine. Avoid taking products that contain aspirin, acetaminophen, ibuprofen, naproxen, or ketoprofen unless instructed by your doctor. These medicines may hide a fever. Do not become pregnant while taking this medicine. Women should inform their doctor if   they wish to become pregnant or think they might be pregnant. There is a potential for serious side effects to an unborn child. Talk to your health care professional or pharmacist for more  information. Do not breast-feed an infant while taking this medicine. What side effects may I notice from receiving this medicine? Side effects that you should report to your doctor or health care professional as soon as possible: -allergic reactions like skin rash, itching or hives, swelling of the face, lips, or tongue -low blood counts - this medicine may decrease the number of white blood cells, red blood cells and platelets. You may be at increased risk for infections and bleeding. -signs of infection - fever or chills, cough, sore throat, pain or difficulty passing urine -signs of decreased platelets or bleeding - bruising, pinpoint red spots on the skin, black, tarry stools, blood in the urine -signs of decreased red blood cells - unusually weak or tired, fainting spells, lightheadedness -breathing problems -confused, not responsive -chest pain -fast, irregular heartbeat -feeling faint or lightheaded, falls -mouth sores -redness, blistering, peeling or loosening of the skin, including inside the mouth -stomach pain -swelling of the ankles, feet, or hands -trouble passing urine or change in the amount of urine Side effects that usually do not require medical attention (report to your doctor or other health care professional if they continue or are bothersome): -anxiety -headache -loss of appetite -muscle aches -nausea -night sweats This list may not describe all possible side effects. Call your doctor for medical advice about side effects. You may report side effects to FDA at 1-800-FDA-1088. Where should I keep my medicine? This drug is given in a hospital or clinic and will not be stored at home. NOTE: This sheet is a summary. It may not cover all possible information. If you have questions about this medicine, talk to your doctor, pharmacist, or health care provider.    2016, Elsevier/Gold Standard. (2014-06-18 22:30:56)  

## 2016-04-22 NOTE — Progress Notes (Signed)
Hematology and Oncology Follow Up Visit  Makayla Fisher [id number] 05/12/1922 82 y.o. 04/22/2016   Principle Diagnosis:  Follicular large cell non-Hodgkin's lymphoma Iron deficiency anemia secondary to malabsorption  Current Therapy:   Status post cycle 7 of maintenance Rituxan - complete in June 2018 IV iron-Feraheme as indicated     Interim History:  [name] is back for followup. She is doing okay.  She's had no nausea or vomiting. Her colostomy is working well.   She is on is worried about her cancer coming back. I tried to reassure her about this.  She did have a nice Thanksgiving and Christmas.  She has a little bit of a cough. She is a little bit congested. She says this is going around the assisted living facility.  She does have a colostomy. This seems to be working pretty well.  She's had no issues with rashes. She's had no leg swelling. She's had no fever.  She's had no mouth sores.   Overall, her performance status is ECOG 1-2.  Medications:  Current Outpatient Prescriptions:  .  amLODipine (NORVASC) 5 MG tablet, Take 5 mg by mouth every morning. , Disp: , Rfl:  .  aspirin EC 81 MG tablet, Take 81 mg by mouth daily., Disp: , Rfl:  .  Calcium Carb-Cholecalciferol (CALCIUM-VITAMIN D) 600-400 MG-UNIT TABS, Take 1 tablet by mouth daily., Disp: , Rfl:  .  Cranberry 425 MG CAPS, Take 1 capsule by mouth daily., Disp: , Rfl:  .  ergocalciferol (VITAMIN D2) 50000 UNITS capsule, Take 1 capsule (50,000 Units total) by mouth once a week., Disp: 12 capsule, Rfl: 4 .  famotidine (PEPCID) 20 MG tablet, Take 1 tablet (20 mg total) by mouth 2 (two) times daily., Disp: 60 tablet, Rfl: 6 .  fluconazole (DIFLUCAN) 100 MG tablet, Take 1 tablet (100 mg total) by mouth daily., Disp: 30 tablet, Rfl: 2 .  furosemide (LASIX) 20 MG tablet, Take 20 mg by mouth daily. , Disp: , Rfl:  .  levothyroxine (SYNTHROID, LEVOTHROID) 50 MCG tablet, Take 50 mcg by mouth every morning., Disp:  , Rfl:  .  levothyroxine (SYNTHROID, LEVOTHROID) 75 MCG tablet, Take 75 mcg by mouth daily before breakfast. Reported on 10/26/2015, Disp: , Rfl:  .  lidocaine-prilocaine (EMLA) cream, Apply 1 application topically as needed (for port access)., Disp: 30 g, Rfl: 6 .  lisinopril (PRINIVIL,ZESTRIL) 20 MG tablet, Take 20 mg by mouth 2 (two) times daily., Disp: , Rfl:  .  LORazepam (ATIVAN) 0.5 MG tablet, Take 0.5 mg by mouth daily as needed for anxiety., Disp: , Rfl:  .  Multiple Vitamin (MULTIVITAMIN WITH MINERALS) TABS tablet, Take 1 tablet by mouth daily., Disp: , Rfl:  .  nystatin cream (MYCOSTATIN), Apply 1 application topically daily as needed for dry skin., Disp: , Rfl:  .  ondansetron (ZOFRAN) 8 MG tablet, Take 1 tablet (8 mg total) by mouth 2 (two) times daily. For 4 days following chemo, Disp: 20 tablet, Rfl: 2 .  potassium chloride (K-DUR) 10 MEQ tablet, Take 10 mEq by mouth daily., Disp: , Rfl:  .  rOPINIRole (REQUIP) 1 MG tablet, Take 1 mg by mouth at bedtime., Disp: , Rfl:  .  sertraline (ZOLOFT) 25 MG tablet, Take 25 mg by mouth every morning. , Disp: , Rfl:  .  SPIRIVA HANDIHALER 18 MCG inhalation capsule, Place 18 mcg into inhaler and inhale as needed., Disp: , Rfl:  .  vitamin C (ASCORBIC ACID) 500 MG tablet, Take 500  mg by mouth daily., Disp: , Rfl:  No current facility-administered medications for this visit.   Facility-Administered Medications Ordered in Other Visits:  .  heparin lock flush 100 unit/mL, 500 Units, Intracatheter, Once PRN, [name], MD .  Derrill Memo ON 04/23/2016] riTUXimab (RITUXAN) 700 mg in sodium chloride 0.9 % 180 mL chemo infusion, 375 mg/m2 (Treatment Plan Recorded), Intravenous, Once, [name], MD .  sodium chloride 0.9 % injection 10 mL, 10 mL, Intracatheter, PRN, [name], MD  Allergies:  Allergies  Allergen Reactions  . Epipen [Epinephrine]     Heart palpitations  . Heparin     Unsure of side affects (maybe affected platelets per  pt)  . Levaquin [Levofloxacin In D5w] Other (See Comments)    Weakness  . Chlorhexidine Gluconate Other (See Comments)    Pt declined use and suspects she may have an intolerance,   . Sulfa Antibiotics Rash    Past Medical History, Surgical history, Social history, and Family History were reviewed and updated.  Review of Systems: As above  Physical Exam:  weight is 161 lb 1.9 oz (73.1 kg). Her oral temperature is 98 F (36.7 C). Her blood pressure is 138/45 (abnormal) and her pulse is 62. Her respiration is 20 and oxygen saturation is 99%.   Elderly white female. Head and neck exam shows no ocular or oral lesions. There are no palpable cervical or supraclavicular lymph nodes. Lungs are clear bilaterally. Cardiac exam regular rate and rhythm with no murmurs rubs or bruits. Abdomen is soft. She has good bowel sounds. The ostomy is intact. There is no stool in the ostomy bag. There is no palpable liver or spleen tip. Her laparotomy scars are well-healed. Her colostomy is in the left lower quadrant. Breast exam shows no bilateral breast masses. She has no bilateral axillary adenopathy. Back exam shows no tenderness over the spine ribs or hips. Extremities shows no clubbing cyanosis or edema.  neurological exam is nonfocal..   Lab Results  Component Value Date   WBC 7.0 04/22/2016   HGB 11.9 04/22/2016   HCT 35.4 04/22/2016   MCV 97 04/22/2016   PLT 169 04/22/2016     Chemistry      Component Value Date/Time   NA 144 04/22/2016 0926   NA 141 05/08/2014 1041   K 3.8 04/22/2016 0926   K 4.5 05/08/2014 1041   CL 106 04/22/2016 0926   CO2 25 04/22/2016 0926   CO2 24 05/08/2014 1041   BUN 12 04/22/2016 0926   BUN 14.2 05/08/2014 1041   CREATININE 0.7 04/22/2016 0926   CREATININE 0.8 05/08/2014 1041      Component Value Date/Time   CALCIUM 9.3 04/22/2016 0926   CALCIUM 9.2 05/08/2014 1041   ALKPHOS 85 (H) 04/22/2016 0926   ALKPHOS 66 05/08/2014 1041   AST 28 04/22/2016 0926    AST 23 05/08/2014 1041   ALT 16 04/22/2016 0926   ALT 10 05/08/2014 1041   BILITOT 0.70 04/22/2016 0926   BILITOT 0.42 05/08/2014 1041         Impression and Plan: Makayla Fisher is 82 year old female. She has a follicular large cell lymphoma. I do not see any evidence of recurrent disease. I suppose that she is at high risk for occurrence. Her labs look really good.  I do not see a need for any scans right now.  We will finish up her Rituxan in June 2018.   I will plan to get her back in  March. I think this would be reasonable.     [name], MD 12/29/201710:43 AM

## 2016-05-05 ENCOUNTER — Encounter: Payer: Self-pay | Admitting: Cardiovascular Disease

## 2016-05-05 ENCOUNTER — Ambulatory Visit (INDEPENDENT_AMBULATORY_CARE_PROVIDER_SITE_OTHER): Payer: Medicare Other | Admitting: Cardiovascular Disease

## 2016-05-05 VITALS — BP 140/60 | HR 67 | Ht 60.0 in | Wt 171.8 lb

## 2016-05-05 DIAGNOSIS — I2583 Coronary atherosclerosis due to lipid rich plaque: Secondary | ICD-10-CM | POA: Diagnosis not present

## 2016-05-05 DIAGNOSIS — I739 Peripheral vascular disease, unspecified: Secondary | ICD-10-CM

## 2016-05-05 DIAGNOSIS — I779 Disorder of arteries and arterioles, unspecified: Secondary | ICD-10-CM | POA: Diagnosis not present

## 2016-05-05 DIAGNOSIS — I2510 Atherosclerotic heart disease of native coronary artery without angina pectoris: Secondary | ICD-10-CM

## 2016-05-05 NOTE — Progress Notes (Signed)
Patient ID: Makayla Fisher, female   DOB: 02/24/1923, 83 y.o.   MRN: [id number]    83 y.o.  Referred on 12/15  for preop clearance Has follicular large cell lymphoma of abdomen.  Ongoing Rx with chemo by Dr Marin Olp.  Has ostomy and wants to have It reversed  It is not external , leaks stool and gets irritated often  Colostomy done 2014 She underwent an open right colectomy and sigmoid colectomy with ileotransverse anastomosis and end descending colostomy with Hartmann's pouch. She had what sounds like a very long protracted post operative course requiring mechanical ventilation.  It also appears that she went into acute renal failure. Surgery was done emergently due to diverticular perforation with stricture.  PET scan 8/15 showed near total resolution of cancer in abdomen and repeat scan planned for January  Distant history of CAD  With stent /CABG in 2009 [facility]  Records not available in [place]  Echo done 3/15 for chemo in [facility] [place] showed normal EF , abnormal relaxation and mild MR  Reviewed  05/25/15  carotid duplex reviewed  40-59% RICA stenosis.  F/U duplex in one year   06/13/15   Had colostomy revision for stricture with slow recovery but no cardiac complications by [name]   [id number]  Beta blocker decreased due to bradycardia  Cough with some chronic bronchitis mucinex helps   ROS: Denies fever, malais, weight loss, blurry vision, decreased visual acuity, cough, sputum, SOB, hemoptysis, pleuritic pain, palpitaitons, heartburn, abdominal pain, melena, lower extremity edema, claudication, or rash.  All other systems reviewed and negative   General: Affect appropriate Elderly spry female  HEENT: normal Neck supple with no adenopathy JVP normal no bruits no thyromegaly Lungs diffuse crackles no wheezing and good diaphragmatic motion Heart:  S1/S2 SEM  murmur,rub, gallop or click previous sternotomy PMI normal Abdomen: benighn, BS positve, no  tenderness, no AAA ostomy with bag LLQ no bruit.  No HSM or HJR Distal pulses intact with no bruits No edema Neuro non-focal Skin warm and dry No muscular weakness  Medications Current Outpatient Prescriptions  Medication Sig Dispense Refill  . amLODipine (NORVASC) 5 MG tablet Take 5 mg by mouth every morning.     Marland Kitchen aspirin EC 81 MG tablet Take 81 mg by mouth 2 (two) times a week.     . Cranberry 425 MG CAPS Take 1 capsule by mouth daily.    . ergocalciferol (VITAMIN D2) 50000 UNITS capsule Take 1 capsule (50,000 Units total) by mouth once a week. 12 capsule 4  . famotidine (PEPCID) 20 MG tablet Take 20 mg by mouth daily.    . fluconazole (DIFLUCAN) 100 MG tablet Take 1 tablet (100 mg total) by mouth daily. 30 tablet 2  . furosemide (LASIX) 20 MG tablet Take 20 mg by mouth daily.     Marland Kitchen levothyroxine (SYNTHROID, LEVOTHROID) 50 MCG tablet Take 50 mcg by mouth every morning.    Marland Kitchen lisinopril (PRINIVIL,ZESTRIL) 20 MG tablet Take 20 mg by mouth daily.    . Multiple Vitamin (MULTIVITAMIN WITH MINERALS) TABS tablet Take 1 tablet by mouth daily.    Marland Kitchen rOPINIRole (REQUIP) 1 MG tablet Take 1 mg by mouth at bedtime.    . sertraline (ZOLOFT) 25 MG tablet Take 25 mg by mouth every morning.     Marland Kitchen SPIRIVA HANDIHALER 18 MCG inhalation capsule Place 18 mcg into inhaler and inhale as needed.     No current facility-administered medications for this visit.  Allergies Epipen [epinephrine]; Heparin; Levaquin [levofloxacin in d5w]; Chlorhexidine gluconate; and Sulfa antibiotics  Family History: Family History  Problem Relation Age of Onset  . Diverticulitis Mother   . Heart attack Mother   . Ulcers Father     Social History: Social History   Social History  . Marital status: Married    Spouse name: N/A  . Number of children: N/A  . Years of education: N/A   Occupational History  . Not on file.   Social History Main Topics  . Smoking status: Former Smoker    Packs/day: 1.00    Years:  25.00    Types: Cigarettes    Start date: 05/16/1948    Quit date: 05/17/1963  . Smokeless tobacco: Never Used     Comment: quit smoking 50 years ago  . Alcohol use No  . Drug use: No  . Sexual activity: Not on file   Other Topics Concern  . Not on file   Social History Narrative  . No narrative on file    Past Surgical History:  Procedure Laterality Date  . c section  1947, 1963  . CARDIAC SURGERY  2009  . CHOLECYSTECTOMY  1973  . COLON SURGERY  2014   Colostomy  . COLOSTOMY REVISION N/A 06/12/2014   Procedure: COLOSTOMY REVISION;  Surgeon: [name], MD;  Location: [facility];  Service: General;  Laterality: N/A;  . CORONARY ANGIOPLASTY WITH STENT PLACEMENT  2009  . CORONARY ARTERY BYPASS GRAFT  aug 2009    x3  . ESOPHAGOGASTRODUODENOSCOPY  2013  . EYE SURGERY Bilateral  10 years ago   lens replacments cataracts  . right chest pac  june 2015    Past Medical History:  Diagnosis Date  . GERD (gastroesophageal reflux disease)   . Heart attack 2009  . History of blood transfusion 2009  . Iron deficiency anemia due to chronic blood loss 08/07/2014  . NHL (non-Hodgkin's lymphoma) ([place]) 10/18/2013   finished chemo dec 2015, maintenanance retuxin    Electrocardiogram:  4/27  SR rate 71  LAE no old MI  Low voltage  SB rate 53  Otherwise normal ECG  05/05/16  SR normal ECG   Assessment and Plan CAD:  Stable with no angina and good activity level.  Continue medical Rx Distant CABG in 2009 HTN:  Well controlled.  Continue current medications and low sodium Dash type diet.   Chol:  Continue simvastatin labs with primary target LDL under 100 with CAD despite age  Stop Zetia GI:  Good result with colostomy revision no cardiac complication with surgery  Bradycardia:  Improved with lower dose beta blocker no indication for pacer  Carotid:  40-59% RICA stenosis f/u duplex 04/2016 Pulmonary:   CXR 08/25/15 bronchitis and kyphosis   F/u with me in 6 months  Jenkins Rouge

## 2016-05-05 NOTE — Patient Instructions (Addendum)
Medication Instructions:  Your physician recommends that you continue on your current medications as directed. Please refer to the Current Medication list given to you today.  Labwork: NONE  Testing/Procedures: Your physician has requested that you have a carotid duplex. This test is an ultrasound of the carotid arteries in your neck. It looks at blood flow through these arteries that supply the brain with blood. Allow one hour for this exam. There are no restrictions or special instructions.  Follow-Up: Your physician wants you to follow-up in: 6 months with Dr. Nishan. You will receive a reminder letter in the mail two months in advance. If you don't receive a letter, please call our office to schedule the follow-up appointment.   If you need a refill on your cardiac medications before your next appointment, please call your pharmacy.    

## 2016-05-20 ENCOUNTER — Ambulatory Visit (HOSPITAL_COMMUNITY)
Admission: RE | Admit: 2016-05-20 | Discharge: 2016-05-20 | Disposition: A | Discharge status: Home / Self Care | Payer: Medicare Other | Source: Ambulatory Visit | Attending: Cardiology | Admitting: Cardiology

## 2016-05-20 DIAGNOSIS — I2510 Atherosclerotic heart disease of native coronary artery without angina pectoris: Secondary | ICD-10-CM | POA: Insufficient documentation

## 2016-05-20 DIAGNOSIS — I2583 Coronary atherosclerosis due to lipid rich plaque: Secondary | ICD-10-CM | POA: Insufficient documentation

## 2016-05-23 ENCOUNTER — Telehealth: Payer: Self-pay | Admitting: Cardiovascular Disease

## 2016-05-23 NOTE — Telephone Encounter (Signed)
Called patient back with carotid results.  Per Dr. Johnsie Cancel, 1-39% bilateral ICA disease f/u duplex in 2 years. Patient verbalized understanding.

## 2016-05-23 NOTE — Telephone Encounter (Signed)
Mrs. Lennert is returning a call . Thanks

## 2016-06-03 ENCOUNTER — Ambulatory Visit (HOSPITAL_BASED_OUTPATIENT_CLINIC_OR_DEPARTMENT_OTHER): Payer: Medicare Other

## 2016-06-03 VITALS — BP 166/58 | HR 68 | Temp 97.4°F | Resp 20

## 2016-06-03 DIAGNOSIS — Z452 Encounter for adjustment and management of vascular access device: Secondary | ICD-10-CM | POA: Diagnosis present

## 2016-06-03 DIAGNOSIS — C8233 Follicular lymphoma grade IIIa, intra-abdominal lymph nodes: Secondary | ICD-10-CM | POA: Diagnosis not present

## 2016-06-03 MED ORDER — HEPARIN SOD (PORK) LOCK FLUSH 100 UNIT/ML IV SOLN
500.0000 [IU] | Freq: Once | INTRAVENOUS | Status: AC
  Administered 2016-06-03: 500 [IU] via INTRAVENOUS
  Filled 2016-06-03: qty 5

## 2016-06-03 MED ORDER — SODIUM CHLORIDE 0.9% FLUSH
10.0000 mL | INTRAVENOUS | Status: DC | PRN
  Filled 2016-06-03: qty 10

## 2016-06-03 NOTE — Patient Instructions (Signed)
Implanted Port Insertion, Care After Refer to this sheet in the next few weeks. These instructions provide you with information on caring for yourself after your procedure. Your health care provider may also give you more specific instructions. Your treatment has been planned according to current medical practices, but problems sometimes occur. Call your health care provider if you have any problems or questions after your procedure. WHAT TO EXPECT AFTER THE PROCEDURE After your procedure, it is typical to have the following:   Discomfort at the port insertion site. Ice packs to the area will help.  Bruising on the skin over the port. This will subside in 3-4 days. HOME CARE INSTRUCTIONS  After your port is placed, you will get a manufacturer's information card. The card has information about your port. Keep this card with you at all times.   Know what kind of port you have. There are many types of ports available.   Wear a medical alert bracelet in case of an emergency. This can help alert health care workers that you have a port.   The port can stay in for as long as your health care provider believes it is necessary.   A home health care nurse may give medicines and take care of the port.   You or a family member can get special training and directions for giving medicine and taking care of the port at home.  SEEK MEDICAL CARE IF:   Your port does not flush or you are unable to get a blood return.   You have a fever or chills. SEEK IMMEDIATE MEDICAL CARE IF:  You have new fluid or pus coming from your incision.   You notice a bad smell coming from your incision site.   You have swelling, pain, or more redness at the incision or port site.   You have chest pain or shortness of breath. This information is not intended to replace advice given to you by your health care provider. Make sure you discuss any questions you have with your health care provider. Document  Released: 01/30/2013 Document Revised: 04/16/2013 Document Reviewed: 01/30/2013 Elsevier Interactive Patient Education  2017 Elsevier Inc.  

## 2016-07-21 ENCOUNTER — Ambulatory Visit: Payer: Medicare Other

## 2016-07-21 ENCOUNTER — Ambulatory Visit (HOSPITAL_BASED_OUTPATIENT_CLINIC_OR_DEPARTMENT_OTHER): Payer: Medicare Other | Admitting: Hematology & Oncology

## 2016-07-21 ENCOUNTER — Other Ambulatory Visit (HOSPITAL_BASED_OUTPATIENT_CLINIC_OR_DEPARTMENT_OTHER): Payer: Medicare Other

## 2016-07-21 ENCOUNTER — Ambulatory Visit (HOSPITAL_BASED_OUTPATIENT_CLINIC_OR_DEPARTMENT_OTHER): Payer: Medicare Other

## 2016-07-21 VITALS — HR 61 | Temp 97.6°F | Resp 18

## 2016-07-21 VITALS — BP 144/49 | HR 62 | Resp 18

## 2016-07-21 DIAGNOSIS — C8233 Follicular lymphoma grade IIIa, intra-abdominal lymph nodes: Secondary | ICD-10-CM

## 2016-07-21 DIAGNOSIS — D500 Iron deficiency anemia secondary to blood loss (chronic): Secondary | ICD-10-CM

## 2016-07-21 DIAGNOSIS — Z5112 Encounter for antineoplastic immunotherapy: Secondary | ICD-10-CM

## 2016-07-21 DIAGNOSIS — C8203 Follicular lymphoma grade I, intra-abdominal lymph nodes: Secondary | ICD-10-CM

## 2016-07-21 LAB — CMP (CANCER CENTER ONLY)
ALBUMIN: 4 g/dL (ref 3.3–5.5)
ALK PHOS: 62 U/L (ref 26–84)
ALT: 17 U/L (ref 10–47)
AST: 25 U/L (ref 11–38)
BILIRUBIN TOTAL: 0.8 mg/dL (ref 0.20–1.60)
BUN: 13 mg/dL (ref 7–22)
CHLORIDE: 106 meq/L (ref 98–108)
CO2: 26 mEq/L (ref 18–33)
CREATININE: 0.9 mg/dL (ref 0.6–1.2)
Calcium: 9.6 mg/dL (ref 8.0–10.3)
Glucose, Bld: 108 mg/dL (ref 73–118)
Potassium: 3.9 mEq/L (ref 3.3–4.7)
SODIUM: 140 meq/L (ref 128–145)
TOTAL PROTEIN: 6.3 g/dL — AB (ref 6.4–8.1)

## 2016-07-21 LAB — CBC WITH DIFFERENTIAL/PLATELET
BASO%: 1.3 % (ref 0.0–2.0)
BASOS ABS: 0.1 10*3/uL (ref 0.0–0.1)
EOS ABS: 0.1 10*3/uL (ref 0.0–0.5)
EOS%: 1.8 % (ref 0.0–7.0)
HCT: 37.5 % (ref 34.8–46.6)
HEMOGLOBIN: 12.4 g/dL (ref 11.6–15.9)
LYMPH%: 16.4 % (ref 14.0–49.7)
MCH: 32.2 pg (ref 25.1–34.0)
MCHC: 33.2 g/dL (ref 31.5–36.0)
MCV: 97.1 fL (ref 79.5–101.0)
MONO#: 0.9 10*3/uL (ref 0.1–0.9)
MONO%: 14.7 % — AB (ref 0.0–14.0)
NEUT%: 65.8 % (ref 38.4–76.8)
NEUTROS ABS: 3.9 10*3/uL (ref 1.5–6.5)
PLATELETS: 161 10*3/uL (ref 145–400)
RBC: 3.86 10*6/uL (ref 3.70–5.45)
RDW: 16.3 % — AB (ref 11.2–14.5)
WBC: 6 10*3/uL (ref 3.9–10.3)
lymph#: 1 10*3/uL (ref 0.9–3.3)

## 2016-07-21 LAB — IRON AND TIBC
%SAT: 23 % (ref 21–57)
Iron: 59 ug/dL (ref 41–142)
TIBC: 250 ug/dL (ref 236–444)
UIBC: 191 ug/dL (ref 120–384)

## 2016-07-21 LAB — FERRITIN: Ferritin: 161 ng/ml (ref 9–269)

## 2016-07-21 LAB — LACTATE DEHYDROGENASE: LDH: 183 U/L (ref 125–245)

## 2016-07-21 MED ORDER — DIPHENHYDRAMINE HCL 25 MG PO CAPS
ORAL_CAPSULE | ORAL | Status: AC
  Filled 2016-07-21: qty 2

## 2016-07-21 MED ORDER — SODIUM CHLORIDE 0.9 % IJ SOLN
10.0000 mL | INTRAMUSCULAR | Status: DC | PRN
Start: 2016-07-21 — End: 2016-07-21
  Administered 2016-07-21: 10 mL
  Filled 2016-07-21: qty 10

## 2016-07-21 MED ORDER — RITUXIMAB CHEMO INJECTION 500 MG/50ML
375.0000 mg/m2 | Freq: Once | INTRAVENOUS | Status: AC
  Administered 2016-07-21: 700 mg via INTRAVENOUS
  Filled 2016-07-21: qty 50

## 2016-07-21 MED ORDER — FAMOTIDINE IN NACL 20-0.9 MG/50ML-% IV SOLN: 40.0000 mg | Freq: Once | INTRAVENOUS | Status: DC

## 2016-07-21 MED ORDER — ACETAMINOPHEN 325 MG PO TABS
650.0000 mg | ORAL_TABLET | Freq: Once | ORAL | Status: AC
  Administered 2016-07-21: 650 mg via ORAL

## 2016-07-21 MED ORDER — SODIUM CHLORIDE 0.9 % IV SOLN
Freq: Once | INTRAVENOUS | Status: AC
  Administered 2016-07-21: 11:00:00 via INTRAVENOUS

## 2016-07-21 MED ORDER — METHYLPREDNISOLONE SODIUM SUCC 125 MG IJ SOLR
INTRAMUSCULAR | Status: AC
  Filled 2016-07-21: qty 2

## 2016-07-21 MED ORDER — HEPARIN SOD (PORK) LOCK FLUSH 100 UNIT/ML IV SOLN
500.0000 [IU] | Freq: Once | INTRAVENOUS | Status: AC | PRN
  Administered 2016-07-21: 500 [IU]
  Filled 2016-07-21: qty 5

## 2016-07-21 MED ORDER — ACETAMINOPHEN 325 MG PO TABS
ORAL_TABLET | ORAL | Status: AC
  Filled 2016-07-21: qty 2

## 2016-07-21 MED ORDER — DIPHENHYDRAMINE HCL 25 MG PO CAPS
50.0000 mg | ORAL_CAPSULE | Freq: Once | ORAL | Status: AC
  Administered 2016-07-21: 50 mg via ORAL

## 2016-07-21 MED ORDER — METHYLPREDNISOLONE SODIUM SUCC 125 MG IJ SOLR
60.0000 mg | Freq: Once | INTRAMUSCULAR | Status: AC
  Administered 2016-07-21: 60 mg via INTRAVENOUS

## 2016-07-21 NOTE — Progress Notes (Signed)
Hematology and Oncology Follow Up Visit  Makayla Fisher [id number] 07/24/1922 83 y.o. 07/21/2016   Principle Diagnosis:  Follicular large cell non-Hodgkin's lymphoma Iron deficiency anemia secondary to malabsorption  Current Therapy:   Status post cycle 7 of maintenance Rituxan - complete in June 2018 IV iron-Feraheme as indicated     Interim History:  [name] is back for followup. She is doing okay.  She's had no nausea or vomiting. Her colostomy is working well.   She is on is worried about her lymphoma coming back. I tried to reassure her about this.  She still is at the nursing home. She is doing okay. She does do some physical therapy.   She has no problems with fever. She's had no bleeding. There's been no obvious change in bowel or bladder habits. She does have the colostomy.  She is very proud of her new Recruitment consultant.   Overall, her performance status is ECOG 1-2.  Medications:  Current Outpatient Prescriptions:  .  amLODipine (NORVASC) 5 MG tablet, Take 5 mg by mouth every morning. , Disp: , Rfl:  .  aspirin EC 81 MG tablet, Take 81 mg by mouth 2 (two) times a week. , Disp: , Rfl:  .  Cranberry 425 MG CAPS, Take 1 capsule by mouth daily., Disp: , Rfl:  .  ergocalciferol (VITAMIN D2) 50000 UNITS capsule, Take 1 capsule (50,000 Units total) by mouth once a week., Disp: 12 capsule, Rfl: 4 .  famotidine (PEPCID) 20 MG tablet, Take 20 mg by mouth daily., Disp: , Rfl:  .  fluconazole (DIFLUCAN) 100 MG tablet, Take 1 tablet (100 mg total) by mouth daily., Disp: 30 tablet, Rfl: 2 .  levothyroxine (SYNTHROID, LEVOTHROID) 50 MCG tablet, Take 50 mcg by mouth every morning., Disp: , Rfl:  .  lisinopril (PRINIVIL,ZESTRIL) 20 MG tablet, Take 20 mg by mouth daily., Disp: , Rfl:  .  Multiple Vitamin (MULTIVITAMIN WITH MINERALS) TABS tablet, Take 1 tablet by mouth daily., Disp: , Rfl:  .  rOPINIRole (REQUIP) 1 MG tablet, Take 1 mg by mouth at bedtime., Disp: , Rfl:    .  sertraline (ZOLOFT) 25 MG tablet, Take 25 mg by mouth every morning. , Disp: , Rfl:  .  SPIRIVA HANDIHALER 18 MCG inhalation capsule, Place 18 mcg into inhaler and inhale as needed., Disp: , Rfl:   Allergies:  Allergies  Allergen Reactions  . Epipen [Epinephrine]     Heart palpitations  . Heparin     Unsure of side affects (maybe affected platelets per pt)  . Levaquin [Levofloxacin In D5w] Other (See Comments)    Weakness  . Chlorhexidine Gluconate Other (See Comments)    Pt declined use and suspects she may have an intolerance,   . Sulfa Antibiotics Rash    Past Medical History, Surgical history, Social history, and Family History were reviewed and updated.  Review of Systems: As above  Physical Exam:  oral temperature is 97.6 F (36.4 C). Her blood pressure is 144/57 (abnormal) and her pulse is 61. Her respiration is 18 and oxygen saturation is 98%.   Elderly white female. Head and neck exam shows no ocular or oral lesions. There are no palpable cervical or supraclavicular lymph nodes. Lungs are clear bilaterally. Cardiac exam regular rate and rhythm with no murmurs rubs or bruits. Abdomen is soft. She has good bowel sounds. The ostomy is intact. There is no stool in the ostomy bag. There is no palpable liver or spleen tip.  Her laparotomy scars are well-healed. Her colostomy is in the left lower quadrant. Breast exam shows no bilateral breast masses. She has no bilateral axillary adenopathy. Back exam shows no tenderness over the spine ribs or hips. Extremities shows no clubbing cyanosis or edema.  neurological exam is nonfocal..   Lab Results  Component Value Date   WBC 7.0 04/22/2016   HGB 11.9 04/22/2016   HCT 35.4 04/22/2016   MCV 97 04/22/2016   PLT 169 04/22/2016     Chemistry      Component Value Date/Time   NA 140 07/21/2016 0856   NA 141 05/08/2014 1041   K 3.9 07/21/2016 0856   K 4.5 05/08/2014 1041   CL 106 07/21/2016 0856   CO2 26 07/21/2016 0856   CO2  24 05/08/2014 1041   BUN 13 07/21/2016 0856   BUN 14.2 05/08/2014 1041   CREATININE 0.9 07/21/2016 0856   CREATININE 0.8 05/08/2014 1041      Component Value Date/Time   CALCIUM 9.6 07/21/2016 0856   CALCIUM 9.2 05/08/2014 1041   ALKPHOS 62 07/21/2016 0856   ALKPHOS 66 05/08/2014 1041   AST 25 07/21/2016 0856   AST 23 05/08/2014 1041   ALT 17 07/21/2016 0856   ALT 10 05/08/2014 1041   BILITOT 0.80 07/21/2016 0856   BILITOT 0.42 05/08/2014 1041         Impression and Plan: Makayla Fisher is 83 year old female. She has a follicular large cell lymphoma. I do not see any evidence of recurrent disease. I believe that she is at high risk for occurrence. Her labs look really good.  I do not see a need for any scans right now.  We will finish up her Rituxan in June 2018.   I will plan to get her back in June for her last Rituxan. Afterwards, we will just see her back for lab work. I told her that we would not need any scans unless she has symptoms or has abnormal lab work.. I think this would be reasonable.     [name], MD 3/29/201810:19 AM

## 2016-07-21 NOTE — Patient Instructions (Signed)
[facility] Discharge Instructions for Patients Receiving Chemotherapy  Today you received the following chemotherapy agents: Rituxan   To help prevent nausea and vomiting after your treatment, we encourage you to take your nausea medication as directed.    If you develop nausea and vomiting that is not controlled by your nausea medication, call the clinic.   BELOW ARE SYMPTOMS THAT SHOULD BE REPORTED IMMEDIATELY:  *FEVER GREATER THAN 100.5 F  *CHILLS WITH OR WITHOUT FEVER  NAUSEA AND VOMITING THAT IS NOT CONTROLLED WITH YOUR NAUSEA MEDICATION  *UNUSUAL SHORTNESS OF BREATH  *UNUSUAL BRUISING OR BLEEDING  TENDERNESS IN MOUTH AND THROAT WITH OR WITHOUT PRESENCE OF ULCERS  *URINARY PROBLEMS  *BOWEL PROBLEMS  UNUSUAL RASH Items with * indicate a potential emergency and should be followed up as soon as possible.  Feel free to call the clinic you have any questions or concerns. The clinic phone number is [phone].  Please show the CHEMO ALERT CARD at check-in to the Emergency Department and triage nurse.   

## 2016-07-21 NOTE — Patient Instructions (Signed)
Implanted Port Home Guide An implanted port is a type of central line that is placed under the skin. Central lines are used to provide IV access when treatment or nutrition needs to be given through a person's veins. Implanted ports are used for long-term IV access. An implanted port may be placed because:  You need IV medicine that would be irritating to the small veins in your hands or arms.  You need long-term IV medicines, such as antibiotics.  You need IV nutrition for a long period.  You need frequent blood draws for lab tests.  You need dialysis.  Implanted ports are usually placed in the chest area, but they can also be placed in the upper arm, the abdomen, or the leg. An implanted port has two main parts:  Reservoir. The reservoir is round and will appear as a small, raised area under your skin. The reservoir is the part where a needle is inserted to give medicines or draw blood.  Catheter. The catheter is a thin, flexible tube that extends from the reservoir. The catheter is placed into a large vein. Medicine that is inserted into the reservoir goes into the catheter and then into the vein.  How will I care for my incision site? Do not get the incision site wet. Bathe or shower as directed by your health care provider. How is my port accessed? Special steps must be taken to access the port:  Before the port is accessed, a numbing cream can be placed on the skin. This helps numb the skin over the port site.  Your health care provider uses a sterile technique to access the port. ? Your health care provider must put on a mask and sterile gloves. ? The skin over your port is cleaned carefully with an antiseptic and allowed to dry. ? The port is gently pinched between sterile gloves, and a needle is inserted into the port.  Only "non-coring" port needles should be used to access the port. Once the port is accessed, a blood return should be checked. This helps ensure that the port  is in the vein and is not clogged.  If your port needs to remain accessed for a constant infusion, a clear (transparent) bandage will be placed over the needle site. The bandage and needle will need to be changed every week, or as directed by your health care provider.  Keep the bandage covering the needle clean and dry. Do not get it wet. Follow your health care provider's instructions on how to take a shower or bath while the port is accessed.  If your port does not need to stay accessed, no bandage is needed over the port.  What is flushing? Flushing helps keep the port from getting clogged. Follow your health care provider's instructions on how and when to flush the port. Ports are usually flushed with saline solution or a medicine called heparin. The need for flushing will depend on how the port is used.  If the port is used for intermittent medicines or blood draws, the port will need to be flushed: ? After medicines have been given. ? After blood has been drawn. ? As part of routine maintenance.  If a constant infusion is running, the port may not need to be flushed.  How long will my port stay implanted? The port can stay in for as long as your health care provider thinks it is needed. When it is time for the port to come out, surgery will be   done to remove it. The procedure is similar to the one performed when the port was put in. When should I seek immediate medical care? When you have an implanted port, you should seek immediate medical care if:  You notice a bad smell coming from the incision site.  You have swelling, redness, or drainage at the incision site.  You have more swelling or pain at the port site or the surrounding area.  You have a fever that is not controlled with medicine.  This information is not intended to replace advice given to you by your health care provider. Make sure you discuss any questions you have with your health care provider. Document  Released: 04/11/2005 Document Revised: 09/17/2015 Document Reviewed: 12/17/2012 Elsevier Interactive Patient Education  2017 Elsevier Inc.  

## 2016-07-22 LAB — BETA 2 MICROGLOBULIN, SERUM: Beta-2: 3.1 mg/L — ABNORMAL HIGH (ref 0.6–2.4)

## 2016-10-20 ENCOUNTER — Other Ambulatory Visit (HOSPITAL_BASED_OUTPATIENT_CLINIC_OR_DEPARTMENT_OTHER): Payer: Medicare Other

## 2016-10-20 ENCOUNTER — Ambulatory Visit: Payer: Medicare Other

## 2016-10-20 ENCOUNTER — Ambulatory Visit (HOSPITAL_BASED_OUTPATIENT_CLINIC_OR_DEPARTMENT_OTHER): Payer: Medicare Other | Admitting: Hematology & Oncology

## 2016-10-20 ENCOUNTER — Ambulatory Visit (HOSPITAL_BASED_OUTPATIENT_CLINIC_OR_DEPARTMENT_OTHER): Payer: Medicare Other

## 2016-10-20 VITALS — BP 160/62 | HR 60 | Temp 97.8°F | Resp 18

## 2016-10-20 VITALS — BP 163/76 | HR 57 | Temp 97.5°F | Resp 20 | Wt 169.0 lb

## 2016-10-20 DIAGNOSIS — E559 Vitamin D deficiency, unspecified: Secondary | ICD-10-CM

## 2016-10-20 DIAGNOSIS — D500 Iron deficiency anemia secondary to blood loss (chronic): Secondary | ICD-10-CM

## 2016-10-20 DIAGNOSIS — C8233 Follicular lymphoma grade IIIa, intra-abdominal lymph nodes: Secondary | ICD-10-CM

## 2016-10-20 DIAGNOSIS — C8203 Follicular lymphoma grade I, intra-abdominal lymph nodes: Secondary | ICD-10-CM | POA: Diagnosis not present

## 2016-10-20 DIAGNOSIS — Z5112 Encounter for antineoplastic immunotherapy: Secondary | ICD-10-CM

## 2016-10-20 LAB — CBC WITH DIFFERENTIAL (CANCER CENTER ONLY)
BASO#: 0 10*3/uL (ref 0.0–0.2)
BASO%: 0.7 % (ref 0.0–2.0)
EOS ABS: 0.1 10*3/uL (ref 0.0–0.5)
EOS%: 2.4 % (ref 0.0–7.0)
HCT: 38.1 % (ref 34.8–46.6)
HEMOGLOBIN: 12.6 g/dL (ref 11.6–15.9)
LYMPH#: 1.3 10*3/uL (ref 0.9–3.3)
LYMPH%: 22 % (ref 14.0–48.0)
MCH: 32.9 pg (ref 26.0–34.0)
MCHC: 33.1 g/dL (ref 32.0–36.0)
MCV: 100 fL (ref 81–101)
MONO#: 1 10*3/uL — AB (ref 0.1–0.9)
MONO%: 17.2 % — AB (ref 0.0–13.0)
NEUT#: 3.3 10*3/uL (ref 1.5–6.5)
NEUT%: 57.7 % (ref 39.6–80.0)
PLATELETS: 130 10*3/uL — AB (ref 145–400)
RBC: 3.83 10*6/uL (ref 3.70–5.32)
RDW: 14.2 % (ref 11.1–15.7)
WBC: 5.7 10*3/uL (ref 3.9–10.0)

## 2016-10-20 LAB — CMP (CANCER CENTER ONLY)
ALK PHOS: 62 U/L (ref 26–84)
ALT: 18 U/L (ref 10–47)
AST: 26 U/L (ref 11–38)
Albumin: 3.8 g/dL (ref 3.3–5.5)
BILIRUBIN TOTAL: 0.7 mg/dL (ref 0.20–1.60)
BUN: 17 mg/dL (ref 7–22)
CHLORIDE: 108 meq/L (ref 98–108)
CO2: 23 mEq/L (ref 18–33)
CREATININE: 0.8 mg/dL (ref 0.6–1.2)
Calcium: 10.3 mg/dL (ref 8.0–10.3)
Glucose, Bld: 103 mg/dL (ref 73–118)
Potassium: 4 mEq/L (ref 3.3–4.7)
SODIUM: 140 meq/L (ref 128–145)
TOTAL PROTEIN: 6.2 g/dL — AB (ref 6.4–8.1)

## 2016-10-20 LAB — LACTATE DEHYDROGENASE: LDH: 195 U/L (ref 125–245)

## 2016-10-20 MED ORDER — SODIUM CHLORIDE 0.9 % IJ SOLN
10.0000 mL | INTRAMUSCULAR | Status: DC | PRN
  Administered 2016-10-20: 10 mL
  Filled 2016-10-20: qty 10

## 2016-10-20 MED ORDER — DIPHENHYDRAMINE HCL 25 MG PO CAPS
ORAL_CAPSULE | ORAL | Status: AC
  Filled 2016-10-20: qty 2

## 2016-10-20 MED ORDER — RITUXIMAB CHEMO INJECTION 500 MG/50ML
375.0000 mg/m2 | Freq: Once | INTRAVENOUS | Status: AC
  Administered 2016-10-20: 700 mg via INTRAVENOUS
  Filled 2016-10-20: qty 50

## 2016-10-20 MED ORDER — DIPHENHYDRAMINE HCL 25 MG PO CAPS
50.0000 mg | ORAL_CAPSULE | Freq: Once | ORAL | Status: AC
  Administered 2016-10-20: 50 mg via ORAL

## 2016-10-20 MED ORDER — SODIUM CHLORIDE 0.9 % IV SOLN
Freq: Once | INTRAVENOUS | Status: AC
  Administered 2016-10-20: 12:00:00 via INTRAVENOUS

## 2016-10-20 MED ORDER — HEPARIN SOD (PORK) LOCK FLUSH 100 UNIT/ML IV SOLN
500.0000 [IU] | Freq: Once | INTRAVENOUS | Status: AC | PRN
  Administered 2016-10-20: 500 [IU]
  Filled 2016-10-20: qty 5

## 2016-10-20 MED ORDER — ACETAMINOPHEN 325 MG PO TABS
ORAL_TABLET | ORAL | Status: AC
  Filled 2016-10-20: qty 2

## 2016-10-20 MED ORDER — ACETAMINOPHEN 325 MG PO TABS
650.0000 mg | ORAL_TABLET | Freq: Once | ORAL | Status: AC
  Administered 2016-10-20: 650 mg via ORAL

## 2016-10-20 NOTE — Addendum Note (Signed)
Addended by: Burney Gauze R on: 10/20/2016 11:53 AM   Modules accepted: Orders

## 2016-10-20 NOTE — Patient Instructions (Signed)
[facility] Discharge Instructions for Patients Receiving Chemotherapy  Today you received the following chemotherapy agents: Rituxan   To help prevent nausea and vomiting after your treatment, we encourage you to take your nausea medication as directed.    If you develop nausea and vomiting that is not controlled by your nausea medication, call the clinic.   BELOW ARE SYMPTOMS THAT SHOULD BE REPORTED IMMEDIATELY:  *FEVER GREATER THAN 100.5 F  *CHILLS WITH OR WITHOUT FEVER  NAUSEA AND VOMITING THAT IS NOT CONTROLLED WITH YOUR NAUSEA MEDICATION  *UNUSUAL SHORTNESS OF BREATH  *UNUSUAL BRUISING OR BLEEDING  TENDERNESS IN MOUTH AND THROAT WITH OR WITHOUT PRESENCE OF ULCERS  *URINARY PROBLEMS  *BOWEL PROBLEMS  UNUSUAL RASH Items with * indicate a potential emergency and should be followed up as soon as possible.  Feel free to call the clinic you have any questions or concerns. The clinic phone number is [phone].  Please show the CHEMO ALERT CARD at check-in to the Emergency Department and triage nurse.   

## 2016-10-20 NOTE — Patient Instructions (Signed)
Implanted Port Home Guide An implanted port is a type of central line that is placed under the skin. Central lines are used to provide IV access when treatment or nutrition needs to be given through a person's veins. Implanted ports are used for long-term IV access. An implanted port may be placed because:  You need IV medicine that would be irritating to the small veins in your hands or arms.  You need long-term IV medicines, such as antibiotics.  You need IV nutrition for a long period.  You need frequent blood draws for lab tests.  You need dialysis.  Implanted ports are usually placed in the chest area, but they can also be placed in the upper arm, the abdomen, or the leg. An implanted port has two main parts:  Reservoir. The reservoir is round and will appear as a small, raised area under your skin. The reservoir is the part where a needle is inserted to give medicines or draw blood.  Catheter. The catheter is a thin, flexible tube that extends from the reservoir. The catheter is placed into a large vein. Medicine that is inserted into the reservoir goes into the catheter and then into the vein.  How will I care for my incision site? Do not get the incision site wet. Bathe or shower as directed by your health care provider. How is my port accessed? Special steps must be taken to access the port:  Before the port is accessed, a numbing cream can be placed on the skin. This helps numb the skin over the port site.  Your health care provider uses a sterile technique to access the port. ? Your health care provider must put on a mask and sterile gloves. ? The skin over your port is cleaned carefully with an antiseptic and allowed to dry. ? The port is gently pinched between sterile gloves, and a needle is inserted into the port.  Only "non-coring" port needles should be used to access the port. Once the port is accessed, a blood return should be checked. This helps ensure that the port  is in the vein and is not clogged.  If your port needs to remain accessed for a constant infusion, a clear (transparent) bandage will be placed over the needle site. The bandage and needle will need to be changed every week, or as directed by your health care provider.  Keep the bandage covering the needle clean and dry. Do not get it wet. Follow your health care provider's instructions on how to take a shower or bath while the port is accessed.  If your port does not need to stay accessed, no bandage is needed over the port.  What is flushing? Flushing helps keep the port from getting clogged. Follow your health care provider's instructions on how and when to flush the port. Ports are usually flushed with saline solution or a medicine called heparin. The need for flushing will depend on how the port is used.  If the port is used for intermittent medicines or blood draws, the port will need to be flushed: ? After medicines have been given. ? After blood has been drawn. ? As part of routine maintenance.  If a constant infusion is running, the port may not need to be flushed.  How long will my port stay implanted? The port can stay in for as long as your health care provider thinks it is needed. When it is time for the port to come out, surgery will be   done to remove it. The procedure is similar to the one performed when the port was put in. When should I seek immediate medical care? When you have an implanted port, you should seek immediate medical care if:  You notice a bad smell coming from the incision site.  You have swelling, redness, or drainage at the incision site.  You have more swelling or pain at the port site or the surrounding area.  You have a fever that is not controlled with medicine.  This information is not intended to replace advice given to you by your health care provider. Make sure you discuss any questions you have with your health care provider. Document  Released: 04/11/2005 Document Revised: 09/17/2015 Document Reviewed: 12/17/2012 Elsevier Interactive Patient Education  2017 Elsevier Inc.  

## 2016-10-20 NOTE — Addendum Note (Signed)
Addended by: Burney Gauze R on: 10/20/2016 01:49 PM   Modules accepted: Orders

## 2016-10-20 NOTE — Progress Notes (Signed)
Hematology and Oncology Follow Up Visit  Makayla Fisher [id number] 11/23/1922 83 y.o. 10/20/2016   Principle Diagnosis:  Follicular large cell non-Hodgkin's lymphoma Iron deficiency anemia secondary to malabsorption  Current Therapy:   Status post cycle #8 of maintenance Rituxan - complete in June 2018 IV iron-Feraheme as indicated     Interim History:  Makayla Fisher is back for followup. She is doing okay.  She's had no nausea or vomiting. Her colostomy is working well.   She is  always worried about her lymphoma coming back. I tried to reassure her about this.  She still is at the nursing home. She is doing okay. She does do some physical therapy.   She showed me some pictures of the arts and crafts projects that she does. She is very proud of this.  She has no problems with fever. She's had no bleeding. There's been no obvious change in bowel or bladder habits. She does have the colostomy.  She is very proud of her new Recruitment consultant.   Overall, her performance status is ECOG 1-2.  Medications:  Current Outpatient Prescriptions:  Marland Kitchen  Multiple Vitamins-Minerals (RA VISION-VITE PRESERVE PO), Take by mouth., Disp: , Rfl:  .  aspirin EC 81 MG tablet, Take 81 mg by mouth 2 (two) times a week. , Disp: , Rfl:  .  cephALEXin (KEFLEX) 250 MG capsule, , Disp: , Rfl:  .  Cranberry 425 MG CAPS, Take 1 capsule by mouth daily., Disp: , Rfl:  .  ergocalciferol (VITAMIN D2) 50000 UNITS capsule, Take 1 capsule (50,000 Units total) by mouth once a week., Disp: 12 capsule, Rfl: 4 .  famotidine (PEPCID) 20 MG tablet, Take 20 mg by mouth daily., Disp: , Rfl:  .  fluconazole (DIFLUCAN) 100 MG tablet, Take 1 tablet (100 mg total) by mouth daily., Disp: 30 tablet, Rfl: 2 .  levothyroxine (SYNTHROID, LEVOTHROID) 50 MCG tablet, Take 50 mcg by mouth every morning., Disp: , Rfl:  .  lisinopril (PRINIVIL,ZESTRIL) 20 MG tablet, Take 20 mg by mouth daily., Disp: , Rfl:  .  Multiple Vitamin  (MULTIVITAMIN WITH MINERALS) TABS tablet, Take 1 tablet by mouth daily., Disp: , Rfl:  .  olopatadine (PATANOL) 0.1 % ophthalmic solution, , Disp: , Rfl:  .  phenazopyridine (PYRIDIUM) 100 MG tablet, , Disp: , Rfl:  .  rOPINIRole (REQUIP) 1 MG tablet, Take 1 mg by mouth at bedtime., Disp: , Rfl:  .  sertraline (ZOLOFT) 25 MG tablet, Take 25 mg by mouth every morning. , Disp: , Rfl:  .  SPIRIVA HANDIHALER 18 MCG inhalation capsule, Place 18 mcg into inhaler and inhale as needed., Disp: , Rfl:   Allergies:  Allergies  Allergen Reactions  . Epipen [Epinephrine]     Heart palpitations  . Heparin     Unsure of side affects (maybe affected platelets per pt)  . Levaquin [Levofloxacin In D5w] Other (See Comments)    Weakness  . Chlorhexidine Gluconate Other (See Comments)    Pt declined use and suspects she may have an intolerance,   . Sulfa Antibiotics Rash    Past Medical History, Surgical history, Social history, and Family History were reviewed and updated.  Review of Systems: As above  Physical Exam:  weight is 169 lb (76.7 kg). Her oral temperature is 97.5 F (36.4 C). Her blood pressure is 163/76 (abnormal) and her pulse is 57 (abnormal). Her respiration is 20 and oxygen saturation is 98%.   Elderly white female. Head and  neck exam shows no ocular or oral lesions. There are no palpable cervical or supraclavicular lymph nodes. Lungs are clear bilaterally. Cardiac exam regular rate and rhythm with no murmurs rubs or bruits. Abdomen is soft. She has good bowel sounds. The ostomy is intact. There is no stool in the ostomy bag. There is no palpable liver or spleen tip. Her laparotomy scars are well-healed. Her colostomy is in the left lower quadrant. Breast exam shows no bilateral breast masses. She has no bilateral axillary adenopathy. Back exam shows no tenderness over the spine ribs or hips. Extremities shows no clubbing cyanosis or edema.  neurological exam is nonfocal..   Lab Results    Component Value Date   WBC 5.7 10/20/2016   HGB 12.6 10/20/2016   HCT 38.1 10/20/2016   MCV 100 10/20/2016   PLT 130 (L) 10/20/2016     Chemistry      Component Value Date/Time   NA 140 10/20/2016 1003   NA 141 05/08/2014 1041   K 4.0 10/20/2016 1003   K 4.5 05/08/2014 1041   CL 108 10/20/2016 1003   CO2 23 10/20/2016 1003   CO2 24 05/08/2014 1041   BUN 17 10/20/2016 1003   BUN 14.2 05/08/2014 1041   CREATININE 0.8 10/20/2016 1003   CREATININE 0.8 05/08/2014 1041      Component Value Date/Time   CALCIUM 10.3 10/20/2016 1003   CALCIUM 9.2 05/08/2014 1041   ALKPHOS 62 10/20/2016 1003   ALKPHOS 66 05/08/2014 1041   AST 26 10/20/2016 1003   AST 23 05/08/2014 1041   ALT 18 10/20/2016 1003   ALT 10 05/08/2014 1041   BILITOT 0.70 10/20/2016 1003   BILITOT 0.42 05/08/2014 1041         Impression and Plan: Makayla Fisher is 83 year old female. She has a follicular large cell lymphoma. I do not see any evidence of recurrent disease. I believe that she is at significant risk for occurrence. Her labs look really good.  I do not see a need for any scans right now.  This will be her last dose of Rituxan. I did this, we will just follow her along. She is quite anxious about not being on treatment. I told her that we would follow her closely.  I spent about 30 minutes with her. She comes in with her niece. Her niece goes to the same church as I do.  I reassured her as much as I could. I told her that I just do not think that she was going to pass away from [place].  She has a Port-A-Cath in. I would keep the Port-A-Cath in for right now.  I will see her back in 3 months.    [name], MD 6/28/201810:46 AM

## 2016-10-21 LAB — IRON AND TIBC
%SAT: 33 % (ref 21–57)
Iron: 74 ug/dL (ref 41–142)
TIBC: 226 ug/dL — AB (ref 236–444)
UIBC: 152 ug/dL (ref 120–384)

## 2016-10-21 LAB — FERRITIN: FERRITIN: 123 ng/mL (ref 9–269)

## 2016-11-07 ENCOUNTER — Encounter: Payer: Self-pay | Admitting: *Deleted

## 2016-11-20 NOTE — Progress Notes (Signed)
Patient ID: Makayla Fisher, female   DOB: [birth year], 83 y.o.   MRN: [id number]    83 y.o.  Initially seen  for preop clearance in 2505  Has follicular large cell lymphoma of abdomen.   Distant history of CAD  With stent /CABG in [facility]  Has had bradycardia on higher dose beta blocker   Echo done 3/15 for chemo in [place] showed normal EF , abnormal relaxation and mild MR  Reviewed  05/25/15  carotid duplex reviewed  40-59% RICA stenosis.  F/U duplex in one year   06/13/15   Had colostomy revision for stricture with slow recovery but no cardiac complications by [name] Sees Dr Marin Olp has maintenance Rituxan completed 09/2016 and gets IV iron for malabsorption    02/11/15  Beta blocker decreased due to bradycardia   ROS: Denies fever, malais, weight loss, blurry vision, decreased visual acuity, cough, sputum, SOB, hemoptysis, pleuritic pain, palpitaitons, heartburn, abdominal pain, melena, lower extremity edema, claudication, or rash.  All other systems reviewed and negative   General: Affect appropriate Elderly spry female  HEENT: normal Neck supple with no adenopathy JVP normal no bruits no thyromegaly Lungs diffuse crackles no wheezing and good diaphragmatic motion Heart:  S1/S2 SEM  Murmur mild AS ,rub, gallop or click previous sternotomy PMI normal Abdomen: benighn, BS positve, no tenderness, no AAA ostomy with bag LLQ no bruit.  No HSM or HJR Distal pulses intact with no bruits No edema Neuro non-focal Skin warm and dry No muscular weakness Right sided porta cath remains   Medications Current Outpatient Prescriptions  Medication Sig Dispense Refill  . aspirin EC 81 MG tablet Take 81 mg by mouth 2 (two) times a week.     . Cranberry 425 MG CAPS Take 1 capsule by mouth daily.    . ergocalciferol (VITAMIN D2) 50000 UNITS capsule Take 1 capsule (50,000 Units total) by mouth once a week. 12 capsule 4  . famotidine (PEPCID) 20 MG  tablet Take 20 mg by mouth daily.    . fluconazole (DIFLUCAN) 100 MG tablet Take 1 tablet (100 mg total) by mouth daily. 30 tablet 2  . levothyroxine (SYNTHROID, LEVOTHROID) 50 MCG tablet Take 50 mcg by mouth every morning.    Marland Kitchen lisinopril (PRINIVIL,ZESTRIL) 20 MG tablet Take 20 mg by mouth daily.    . Multiple Vitamin (MULTIVITAMIN WITH MINERALS) TABS tablet Take 1 tablet by mouth daily.    . Multiple Vitamins-Minerals (RA VISION-VITE PRESERVE PO) Take by mouth.    . phenazopyridine (PYRIDIUM) 100 MG tablet     . rOPINIRole (REQUIP) 1 MG tablet Take 1 mg by mouth at bedtime.    . sertraline (ZOLOFT) 25 MG tablet Take 25 mg by mouth every morning.     Marland Kitchen SPIRIVA HANDIHALER 18 MCG inhalation capsule Place 18 mcg into inhaler and inhale as needed.     No current facility-administered medications for this visit.     Allergies Epipen [epinephrine]; Heparin; Levaquin [levofloxacin in d5w]; Chlorhexidine gluconate; and Sulfa antibiotics  Family History: Family History  Problem Relation Age of Onset  . Diverticulitis Mother   . Heart attack Mother   . Ulcers Father     Social History: Social History   Social History  . Marital status: Married    Spouse name: N/A  . Number of children: N/A  . Years of education: N/A   Occupational History  . Not on file.   Social History Main Topics  .  Smoking status: Former Smoker    Packs/day: 1.00    Years: 25.00    Types: Cigarettes    Start date: 05/16/1948    Quit date: 05/17/1963  . Smokeless tobacco: Never Used     Comment: quit smoking 50 years ago  . Alcohol use No  . Drug use: No  . Sexual activity: Not on file   Other Topics Concern  . Not on file   Social History Narrative  . No narrative on file    Past Surgical History:  Procedure Laterality Date  . c section  1947, 1963  . CARDIAC SURGERY  2009  . CHOLECYSTECTOMY  1973  . COLON SURGERY  2014   Colostomy  . COLOSTOMY REVISION N/A 06/12/2014   Procedure: COLOSTOMY  REVISION;  Surgeon: [name], MD;  Location: [facility];  Service: General;  Laterality: N/A;  . CORONARY ANGIOPLASTY WITH STENT PLACEMENT  2009  . CORONARY ARTERY BYPASS GRAFT  aug 2009    x3  . ESOPHAGOGASTRODUODENOSCOPY  2013  . EYE SURGERY Bilateral  10 years ago   lens replacments cataracts  . right chest pac  june 2015    Past Medical History:  Diagnosis Date  . GERD (gastroesophageal reflux disease)   . Heart attack ([place]) 2009  . History of blood transfusion 2009  . Iron deficiency anemia due to chronic blood loss 08/07/2014  . NHL (non-Hodgkin's lymphoma) ([place]) 10/18/2013   finished chemo dec 2015, maintenanance retuxin    Electrocardiogram:  4/27  SR rate 71  LAE no old MI  Low voltage  SB rate 53  Otherwise normal ECG  05/05/16  SR normal ECG   Assessment and Plan CAD:  Stable with no angina and good activity level.  Continue medical Rx Distant CABG in 2009 HTN:  Well controlled.  Continue current medications and low sodium Dash type diet.   Chol:  Continue simvastatin labs with primary target LDL under 100 with CAD despite age  Stop Zetia GI:  Good result with colostomy revision no cardiac complication with surgery  Bradycardia:  Improved with lower dose beta blocker no indication for pacer  Carotid:  Reviewed duplex 05/20/16 1-39% ICA bilateral f/u scan 04/2018 Pulmonary:   CXR 08/25/15 bronchitis and kyphosis  AS: likely mild given age and asymptomatic nature no need for echo  F/u with me in  A year   Jenkins Rouge

## 2016-11-21 ENCOUNTER — Encounter: Payer: Self-pay | Admitting: Cardiovascular Disease

## 2016-11-21 ENCOUNTER — Ambulatory Visit (INDEPENDENT_AMBULATORY_CARE_PROVIDER_SITE_OTHER): Payer: Medicare Other | Admitting: Cardiovascular Disease

## 2016-11-21 VITALS — BP 130/62 | HR 68 | Ht 60.0 in | Wt 167.0 lb

## 2016-11-21 DIAGNOSIS — I2583 Coronary atherosclerosis due to lipid rich plaque: Secondary | ICD-10-CM

## 2016-11-21 DIAGNOSIS — I2510 Atherosclerotic heart disease of native coronary artery without angina pectoris: Secondary | ICD-10-CM | POA: Diagnosis not present

## 2016-11-21 NOTE — Patient Instructions (Addendum)

## 2016-12-01 ENCOUNTER — Ambulatory Visit (HOSPITAL_BASED_OUTPATIENT_CLINIC_OR_DEPARTMENT_OTHER): Payer: Medicare Other

## 2016-12-01 VITALS — BP 155/80 | HR 66 | Temp 97.9°F | Resp 16

## 2016-12-01 DIAGNOSIS — C8590 Non-Hodgkin lymphoma, unspecified, unspecified site: Secondary | ICD-10-CM | POA: Diagnosis not present

## 2016-12-01 DIAGNOSIS — Z452 Encounter for adjustment and management of vascular access device: Secondary | ICD-10-CM

## 2016-12-01 MED ORDER — SODIUM CHLORIDE 0.9% FLUSH
10.0000 mL | INTRAVENOUS | Status: DC | PRN
  Administered 2016-12-01: 10 mL via INTRAVENOUS
  Filled 2016-12-01: qty 10

## 2016-12-01 MED ORDER — HEPARIN SOD (PORK) LOCK FLUSH 100 UNIT/ML IV SOLN
500.0000 [IU] | Freq: Once | INTRAVENOUS | Status: AC
  Administered 2016-12-01: 500 [IU] via INTRAVENOUS
  Filled 2016-12-01: qty 5

## 2016-12-01 NOTE — Patient Instructions (Signed)
Implanted Port Home Guide An implanted port is a type of central line that is placed under the skin. Central lines are used to provide IV access when treatment or nutrition needs to be given through a person's veins. Implanted ports are used for long-term IV access. An implanted port may be placed because:  You need IV medicine that would be irritating to the small veins in your hands or arms.  You need long-term IV medicines, such as antibiotics.  You need IV nutrition for a long period.  You need frequent blood draws for lab tests.  You need dialysis.  Implanted ports are usually placed in the chest area, but they can also be placed in the upper arm, the abdomen, or the leg. An implanted port has two main parts:  Reservoir. The reservoir is round and will appear as a small, raised area under your skin. The reservoir is the part where a needle is inserted to give medicines or draw blood.  Catheter. The catheter is a thin, flexible tube that extends from the reservoir. The catheter is placed into a large vein. Medicine that is inserted into the reservoir goes into the catheter and then into the vein.  How will I care for my incision site? Do not get the incision site wet. Bathe or shower as directed by your health care provider. How is my port accessed? Special steps must be taken to access the port:  Before the port is accessed, a numbing cream can be placed on the skin. This helps numb the skin over the port site.  Your health care provider uses a sterile technique to access the port. ? Your health care provider must put on a mask and sterile gloves. ? The skin over your port is cleaned carefully with an antiseptic and allowed to dry. ? The port is gently pinched between sterile gloves, and a needle is inserted into the port.  Only "non-coring" port needles should be used to access the port. Once the port is accessed, a blood return should be checked. This helps ensure that the port  is in the vein and is not clogged.  If your port needs to remain accessed for a constant infusion, a clear (transparent) bandage will be placed over the needle site. The bandage and needle will need to be changed every week, or as directed by your health care provider.  Keep the bandage covering the needle clean and dry. Do not get it wet. Follow your health care provider's instructions on how to take a shower or bath while the port is accessed.  If your port does not need to stay accessed, no bandage is needed over the port.  What is flushing? Flushing helps keep the port from getting clogged. Follow your health care provider's instructions on how and when to flush the port. Ports are usually flushed with saline solution or a medicine called heparin. The need for flushing will depend on how the port is used.  If the port is used for intermittent medicines or blood draws, the port will need to be flushed: ? After medicines have been given. ? After blood has been drawn. ? As part of routine maintenance.  If a constant infusion is running, the port may not need to be flushed.  How long will my port stay implanted? The port can stay in for as long as your health care provider thinks it is needed. When it is time for the port to come out, surgery will be   done to remove it. The procedure is similar to the one performed when the port was put in. When should I seek immediate medical care? When you have an implanted port, you should seek immediate medical care if:  You notice a bad smell coming from the incision site.  You have swelling, redness, or drainage at the incision site.  You have more swelling or pain at the port site or the surrounding area.  You have a fever that is not controlled with medicine.  This information is not intended to replace advice given to you by your health care provider. Make sure you discuss any questions you have with your health care provider. Document  Released: 04/11/2005 Document Revised: 09/17/2015 Document Reviewed: 12/17/2012 Elsevier Interactive Patient Education  2017 Elsevier Inc.  

## 2016-12-13 ENCOUNTER — Encounter (HOSPITAL_COMMUNITY): Payer: Self-pay | Admitting: Emergency Medicine

## 2016-12-13 ENCOUNTER — Inpatient Hospital Stay (HOSPITAL_COMMUNITY)
Admission: EM | Admit: 2016-12-13 | Discharge: 2016-12-19 | DRG: 193 | Disposition: A | Discharge status: Skilled Nursing Facility | Payer: Medicare Other | Attending: Internal Medicine | Admitting: Internal Medicine

## 2016-12-13 ENCOUNTER — Emergency Department (HOSPITAL_COMMUNITY): Payer: Medicare Other

## 2016-12-13 DIAGNOSIS — Z79899 Other long term (current) drug therapy: Secondary | ICD-10-CM | POA: Diagnosis not present

## 2016-12-13 DIAGNOSIS — Z951 Presence of aortocoronary bypass graft: Secondary | ICD-10-CM | POA: Diagnosis not present

## 2016-12-13 DIAGNOSIS — Z87891 Personal history of nicotine dependence: Secondary | ICD-10-CM | POA: Diagnosis not present

## 2016-12-13 DIAGNOSIS — I2510 Atherosclerotic heart disease of native coronary artery without angina pectoris: Secondary | ICD-10-CM | POA: Diagnosis present

## 2016-12-13 DIAGNOSIS — Z882 Allergy status to sulfonamides status: Secondary | ICD-10-CM

## 2016-12-13 DIAGNOSIS — J9601 Acute respiratory failure with hypoxia: Secondary | ICD-10-CM | POA: Diagnosis present

## 2016-12-13 DIAGNOSIS — K219 Gastro-esophageal reflux disease without esophagitis: Secondary | ICD-10-CM | POA: Diagnosis present

## 2016-12-13 DIAGNOSIS — H119 Unspecified disorder of conjunctiva: Secondary | ICD-10-CM | POA: Diagnosis present

## 2016-12-13 DIAGNOSIS — C8590 Non-Hodgkin lymphoma, unspecified, unspecified site: Secondary | ICD-10-CM | POA: Diagnosis present

## 2016-12-13 DIAGNOSIS — I10 Essential (primary) hypertension: Secondary | ICD-10-CM | POA: Diagnosis not present

## 2016-12-13 DIAGNOSIS — Z7982 Long term (current) use of aspirin: Secondary | ICD-10-CM | POA: Diagnosis not present

## 2016-12-13 DIAGNOSIS — J189 Pneumonia, unspecified organism: Principal | ICD-10-CM | POA: Diagnosis present

## 2016-12-13 DIAGNOSIS — I252 Old myocardial infarction: Secondary | ICD-10-CM | POA: Diagnosis not present

## 2016-12-13 DIAGNOSIS — Z888 Allergy status to other drugs, medicaments and biological substances status: Secondary | ICD-10-CM | POA: Diagnosis not present

## 2016-12-13 DIAGNOSIS — J181 Lobar pneumonia, unspecified organism: Secondary | ICD-10-CM | POA: Diagnosis not present

## 2016-12-13 DIAGNOSIS — Z933 Colostomy status: Secondary | ICD-10-CM | POA: Diagnosis not present

## 2016-12-13 DIAGNOSIS — Z955 Presence of coronary angioplasty implant and graft: Secondary | ICD-10-CM | POA: Diagnosis not present

## 2016-12-13 DIAGNOSIS — H409 Unspecified glaucoma: Secondary | ICD-10-CM | POA: Diagnosis present

## 2016-12-13 DIAGNOSIS — Z8572 Personal history of non-Hodgkin lymphomas: Secondary | ICD-10-CM | POA: Diagnosis not present

## 2016-12-13 DIAGNOSIS — N390 Urinary tract infection, site not specified: Secondary | ICD-10-CM | POA: Diagnosis not present

## 2016-12-13 DIAGNOSIS — Z9221 Personal history of antineoplastic chemotherapy: Secondary | ICD-10-CM | POA: Diagnosis not present

## 2016-12-13 LAB — CBC WITH DIFFERENTIAL/PLATELET
Basophils Absolute: 0 10*3/uL (ref 0.0–0.1)
Basophils Relative: 0 %
EOS ABS: 0 10*3/uL (ref 0.0–0.7)
Eosinophils Relative: 0 %
HEMATOCRIT: 32.7 % — AB (ref 36.0–46.0)
HEMOGLOBIN: 11 g/dL — AB (ref 12.0–15.0)
LYMPHS ABS: 0.9 10*3/uL (ref 0.7–4.0)
Lymphocytes Relative: 5 %
MCH: 31.3 pg (ref 26.0–34.0)
MCHC: 33.6 g/dL (ref 30.0–36.0)
MCV: 93.2 fL (ref 78.0–100.0)
MONOS PCT: 5 %
Monocytes Absolute: 0.9 10*3/uL (ref 0.1–1.0)
NEUTROS ABS: 16.8 10*3/uL — AB (ref 1.7–7.7)
NEUTROS PCT: 90 %
Platelets: 259 10*3/uL (ref 150–400)
RBC: 3.51 MIL/uL — ABNORMAL LOW (ref 3.87–5.11)
RDW: 14.2 % (ref 11.5–15.5)
WBC: 18.7 10*3/uL — ABNORMAL HIGH (ref 4.0–10.5)

## 2016-12-13 LAB — BRAIN NATRIURETIC PEPTIDE: B Natriuretic Peptide: 265.6 pg/mL — ABNORMAL HIGH (ref 0.0–100.0)

## 2016-12-13 LAB — I-STAT CG4 LACTIC ACID, ED
Lactic Acid, Venous: 1.04 mmol/L (ref 0.5–1.9)
Lactic Acid, Venous: 0.93 mmol/L (ref 0.5–1.9)

## 2016-12-13 LAB — COMPREHENSIVE METABOLIC PANEL
ALBUMIN: 2.9 g/dL — AB (ref 3.5–5.0)
ALK PHOS: 101 U/L (ref 38–126)
ALT: 27 U/L (ref 14–54)
ANION GAP: 15 (ref 5–15)
AST: 36 U/L (ref 15–41)
BUN: 19 mg/dL (ref 6–20)
CALCIUM: 9.5 mg/dL (ref 8.9–10.3)
CO2: 21 mmol/L — AB (ref 22–32)
Chloride: 103 mmol/L (ref 101–111)
Creatinine, Ser: 0.68 mg/dL (ref 0.44–1.00)
GFR calc Af Amer: >60 mL/min (ref >60)
GFR calc non Af Amer: >60 mL/min (ref >60)
GLUCOSE: 98 mg/dL (ref 65–99)
Potassium: 3.4 mmol/L — ABNORMAL LOW (ref 3.5–5.1)
SODIUM: 139 mmol/L (ref 135–145)
Total Bilirubin: 0.9 mg/dL (ref 0.3–1.2)
Total Protein: 6 g/dL — ABNORMAL LOW (ref 6.5–8.1)

## 2016-12-13 LAB — URINALYSIS, ROUTINE W REFLEX MICROSCOPIC
BILIRUBIN URINE: NEGATIVE
Glucose, UA: NEGATIVE mg/dL
Hgb urine dipstick: NEGATIVE
Ketones, ur: 20 mg/dL — AB
Nitrite: POSITIVE — AB
PROTEIN: 100 mg/dL — AB
Specific Gravity, Urine: 1.017 (ref 1.005–1.030)
pH: 5 (ref 5.0–8.0)

## 2016-12-13 LAB — PROTIME-INR
INR: 1.21
Prothrombin Time: 15.3 seconds — ABNORMAL HIGH (ref 11.4–15.2)

## 2016-12-13 LAB — TROPONIN I: TROPONIN I: 0.06 ng/mL — AB (ref <0.03)

## 2016-12-13 LAB — PROCALCITONIN: PROCALCITONIN: 7.62 ng/mL

## 2016-12-13 LAB — LACTIC ACID, PLASMA: LACTIC ACID, VENOUS: 0.9 mmol/L (ref 0.5–1.9)

## 2016-12-13 MED ORDER — DEXTROSE 5 % IV SOLN
500.0000 mg | Freq: Once | INTRAVENOUS | Status: AC
  Administered 2016-12-13: 500 mg via INTRAVENOUS
  Filled 2016-12-13: qty 500

## 2016-12-13 MED ORDER — SERTRALINE HCL 50 MG PO TABS
25.0000 mg | ORAL_TABLET | Freq: Every morning | ORAL | Status: DC
  Administered 2016-12-14 – 2016-12-19 (×6): 25 mg via ORAL
  Filled 2016-12-13 (×6): qty 1

## 2016-12-13 MED ORDER — TIOTROPIUM BROMIDE MONOHYDRATE 18 MCG IN CAPS
18.0000 ug | ORAL_CAPSULE | Freq: Every day | RESPIRATORY_TRACT | Status: DC
  Administered 2016-12-14 – 2016-12-19 (×5): 18 ug via RESPIRATORY_TRACT
  Filled 2016-12-13 (×2): qty 5

## 2016-12-13 MED ORDER — ASPIRIN EC 81 MG PO TBEC
81.0000 mg | DELAYED_RELEASE_TABLET | ORAL | Status: DC
  Administered 2016-12-15 – 2016-12-19 (×2): 81 mg via ORAL
  Filled 2016-12-13: qty 1

## 2016-12-13 MED ORDER — PHENAZOPYRIDINE HCL 100 MG PO TABS: 100.0000 mg | ORAL_TABLET | Freq: Two times a day (BID) | ORAL | Status: DC

## 2016-12-13 MED ORDER — LISINOPRIL 20 MG PO TABS
20.0000 mg | ORAL_TABLET | Freq: Every day | ORAL | Status: DC
  Administered 2016-12-14 – 2016-12-19 (×6): 20 mg via ORAL
  Filled 2016-12-13 (×6): qty 1

## 2016-12-13 MED ORDER — SACCHAROMYCES BOULARDII 250 MG PO CAPS
250.0000 mg | ORAL_CAPSULE | Freq: Two times a day (BID) | ORAL | Status: DC
  Administered 2016-12-13 – 2016-12-19 (×12): 250 mg via ORAL
  Filled 2016-12-13 (×12): qty 1

## 2016-12-13 MED ORDER — ROPINIROLE HCL 1 MG PO TABS
1.0000 mg | ORAL_TABLET | Freq: Every day | ORAL | Status: DC
  Administered 2016-12-13 – 2016-12-18 (×6): 1 mg via ORAL
  Filled 2016-12-13 (×6): qty 1

## 2016-12-13 MED ORDER — LEVOTHYROXINE SODIUM 50 MCG PO TABS
50.0000 ug | ORAL_TABLET | Freq: Every morning | ORAL | Status: DC
  Administered 2016-12-14 – 2016-12-19 (×6): 50 ug via ORAL
  Filled 2016-12-13 (×6): qty 1

## 2016-12-13 MED ORDER — LORAZEPAM 1 MG PO TABS
1.0000 mg | ORAL_TABLET | Freq: Once | ORAL | Status: AC
  Administered 2016-12-13: 1 mg via ORAL
  Filled 2016-12-13: qty 1

## 2016-12-13 MED ORDER — NAPHAZOLINE-GLYCERIN 0.012-0.2 % OP SOLN
1.0000 [drp] | Freq: Four times a day (QID) | OPHTHALMIC | Status: DC | PRN
  Filled 2016-12-13: qty 15

## 2016-12-13 MED ORDER — SODIUM CHLORIDE 0.9% FLUSH
10.0000 mL | INTRAVENOUS | Status: DC | PRN
  Administered 2016-12-16 – 2016-12-19 (×2): 10 mL
  Filled 2016-12-13 (×2): qty 40

## 2016-12-13 MED ORDER — DEXTROSE 5 % IV SOLN
1.0000 g | INTRAVENOUS | Status: DC
  Administered 2016-12-14 – 2016-12-16 (×3): 1 g via INTRAVENOUS
  Filled 2016-12-13 (×4): qty 10

## 2016-12-13 MED ORDER — LORAZEPAM 2 MG/ML IJ SOLN: 0.2500 mg | INTRAMUSCULAR | Status: AC

## 2016-12-13 MED ORDER — DEXTROSE 5 % IV SOLN
500.0000 mg | INTRAVENOUS | Status: DC
  Administered 2016-12-14 – 2016-12-15 (×2): 500 mg via INTRAVENOUS
  Filled 2016-12-13 (×2): qty 500

## 2016-12-13 MED ORDER — OLOPATADINE HCL 0.1 % OP SOLN
1.0000 [drp] | Freq: Two times a day (BID) | OPHTHALMIC | Status: DC
  Administered 2016-12-14 – 2016-12-19 (×12): 1 [drp] via OPHTHALMIC
  Filled 2016-12-13: qty 5

## 2016-12-13 MED ORDER — DEXTROSE 5 % IV SOLN
1.0000 g | Freq: Once | INTRAVENOUS | Status: AC
  Administered 2016-12-13: 1 g via INTRAVENOUS
  Filled 2016-12-13: qty 10

## 2016-12-13 NOTE — Progress Notes (Signed)
A consult was received from an ED physician for ceftriaxone and azithromycin per pharmacy dosing.  The patient's profile has been reviewed for ht/wt/allergies/indication/available labs.   A one time order has been placed for ceftriaxone 1g and azithromycin 500mg .  Further antibiotics/pharmacy consults should be ordered by admitting physician if indicated.                       Thank you,  Peggyann Juba, PharmD, BCPS Pager: [phone] 12/13/2016 12:23 PM

## 2016-12-13 NOTE — ED Notes (Signed)
Call Report to [place] at [phone] at [phone]

## 2016-12-13 NOTE — Progress Notes (Signed)
Received patient from ED, VS obtained, telemetry applied, oriented to unit, call light placed in reach  

## 2016-12-13 NOTE — ED Triage Notes (Signed)
Per EMS-states SOB for 2 days-productive cough for 5 days-states Rhonchi throughout-place on 3L [place] while in route

## 2016-12-13 NOTE — ED Provider Notes (Signed)
[facility] DEPT Provider Note   CSN: [id number] Arrival date & time: 12/13/16  1016     History   Chief Complaint Chief Complaint  Patient presents with  . Cough    HPI Makayla Fisher is a 83 y.o. female.  83 year old female presents with one-week history of cough and shortness of breath. Denies any fever or chills. No anginal type chest pain. No lower extremity edema. Shortness of breath worse with any kind of ambulation.denies any diarrhea, vomiting, abdominal discomfort. Called EMS and was found to be hypoxemic and placed on oxygen and transported here.      Past Medical History:  Diagnosis Date  . GERD (gastroesophageal reflux disease)   . Heart attack ([place]) 2009  . History of blood transfusion 2009  . Iron deficiency anemia due to chronic blood loss 08/07/2014  . NHL (non-Hodgkin's lymphoma) ([place]) 10/18/2013   finished chemo dec 2015, maintenanance retuxin    Patient Active Problem List   Diagnosis Date Noted  . Vitamin D deficiency 10/20/2016  . Follicular lymphoma grade IIIa of intra-abdominal lymph nodes ([place]) 04/22/2016  . Iron deficiency anemia due to chronic blood loss 08/07/2014  . HTN (hypertension) 07/15/2014  . Unilateral carotid artery disease ([place]) 07/15/2014  . Bradycardia 07/15/2014  . 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 Preop cardiovascular exam 04/08/2014  . NHL (non-Hodgkin's lymphoma) ([place]) 10/18/2013  . 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 c section  1947, 1963  . CARDIAC SURGERY  2009  . CHOLECYSTECTOMY  1973  . COLON SURGERY  2014   Colostomy  . COLOSTOMY REVISION N/A 06/12/2014   Procedure: COLOSTOMY REVISION;  Surgeon: [name], MD;  Location: [facility];  Service: General;  Laterality: N/A;  . CORONARY ANGIOPLASTY WITH STENT PLACEMENT  2009  . CORONARY ARTERY BYPASS GRAFT  aug 2009    x3  . ESOPHAGOGASTRODUODENOSCOPY  2013  . EYE  SURGERY Bilateral  10 years ago   lens replacments cataracts  . right chest pac  june 2015    OB History    No data available       Home Medications    Prior to Admission medications   Medication Sig Start Date End Date Taking? Authorizing Provider  aspirin EC 81 MG tablet Take 81 mg by mouth 2 (two) times a week.     [provider]  Cranberry 425 MG CAPS Take 1 capsule by mouth daily.    [provider]  ergocalciferol (VITAMIN D2) 50000 UNITS capsule Take 1 capsule (50,000 Units total) by mouth once a week. 10/23/14   [name], MD  famotidine (PEPCID) 20 MG tablet Take 20 mg by mouth daily.    [provider]  fluconazole (DIFLUCAN) 100 MG tablet Take 1 tablet (100 mg total) by mouth daily. 10/26/15   [name], MD  levothyroxine (SYNTHROID, LEVOTHROID) 50 MCG tablet Take 50 mcg by mouth every morning. 10/08/15   [provider]  lisinopril (PRINIVIL,ZESTRIL) 20 MG tablet Take 20 mg by mouth daily.    [provider]  Multiple Vitamin (MULTIVITAMIN WITH MINERALS) TABS tablet Take 1 tablet by mouth daily.    [provider]  Multiple Vitamins-Minerals (RA VISION-VITE PRESERVE PO) Take by mouth.    [provider]  phenazopyridine (PYRIDIUM) 100 MG tablet  10/03/16   [provider]  rOPINIRole (REQUIP) 1 MG tablet Take 1 mg by mouth at  bedtime.    [provider]  sertraline (ZOLOFT) 25 MG tablet Take 25 mg by mouth every morning.     [provider]  SPIRIVA HANDIHALER 18 MCG inhalation capsule Place 18 mcg into inhaler and inhale as needed. 07/23/15   [provider]    Family History Family History  Problem Relation Age of Onset  . Diverticulitis Mother   . Heart attack Mother   . Ulcers Father     Social History Social History  Substance Use Topics  . Smoking status: Former Smoker    Packs/day: 1.00    Years: 25.00    Types: Cigarettes    Start date: 05/16/1948     Quit date: 05/17/1963  . Smokeless tobacco: Never Used     Comment: quit smoking 50 years ago  . Alcohol use No     Allergies   Epipen [epinephrine]; Heparin; Levaquin [levofloxacin in d5w]; Chlorhexidine gluconate; and Sulfa antibiotics   Review of Systems Review of Systems  All other systems reviewed and are negative.    Physical Exam Updated Vital Signs BP (!) 165/67   Pulse 93   Temp 98.1 F (36.7 C) (Oral)   Resp (!) 24   SpO2 95%   Physical Exam  Constitutional: She is oriented to person, place, and time. She appears well-developed and well-nourished.  Non-toxic appearance. No distress.  HENT:  Head: Normocephalic and atraumatic.  Eyes: Pupils are equal, round, and reactive to light. Conjunctivae, EOM and lids are normal.  Neck: Normal range of motion. Neck supple. No tracheal deviation present. No thyroid mass present.  Cardiovascular: Normal rate, regular rhythm and normal heart sounds.  Exam reveals no gallop.   No murmur heard. Pulmonary/Chest: No stridor. Tachypnea noted. She is in respiratory distress. She has decreased breath sounds in the right lower field and the left lower field. She has no wheezes. She has rhonchi in the right lower field and the left lower field. She has no rales.  Abdominal: Soft. Normal appearance and bowel sounds are normal. She exhibits no distension. There is no tenderness. There is no rebound and no CVA tenderness.  Musculoskeletal: Normal range of motion. She exhibits no edema or tenderness.  Neurological: She is alert and oriented to person, place, and time. She has normal strength. No cranial nerve deficit or sensory deficit. GCS eye subscore is 4. GCS verbal subscore is 5. GCS motor subscore is 6.  Skin: Skin is warm and dry. No abrasion and no rash noted.  Psychiatric: She has a normal mood and affect. Her speech is normal and behavior is normal.  Nursing note and vitals reviewed.    ED Treatments / Results  Labs (all labs  ordered are listed, but only abnormal results are displayed) Labs Reviewed  CULTURE, BLOOD (ROUTINE X 2)  CULTURE, BLOOD (ROUTINE X 2)  COMPREHENSIVE METABOLIC PANEL  CBC WITH DIFFERENTIAL/PLATELET  PROTIME-INR  URINALYSIS, ROUTINE W REFLEX MICROSCOPIC  TROPONIN I  BRAIN NATRIURETIC PEPTIDE  I-STAT CG4 LACTIC ACID, ED    EKG  EKG Interpretation  Date/Time:  Tuesday December 13 2016 10:48:06 EDT Ventricular Rate:  82 PR Interval:    QRS Duration: 89 QT Interval:  386 QTC Calculation: 451 R Axis:   27 Text Interpretation:  Ectopic atrial rhythm Borderline short PR interval Confirmed by Lacretia Leigh ([id number]) on 12/13/2016 12:13:32 PM       Radiology No results found.  Procedures Procedures (including critical care time)  Medications Ordered in ED Medications - No  data to display   Initial Impression / Assessment and Plan / ED Course  I have reviewed the triage vital signs and the nursing notes.  Pertinent labs & imaging results that were available during my care of the patient were reviewed by me and considered in my medical decision making (see chart for details).    Patient evidence of pneumonia on chest x-ray. Lactate is within normal limits. She is mentating appropriately. No hypotension here. Code sepsisnot.initiated.Also has evidence of UTI. Antibiotics started and will be admitted to the hospitalist service.  Final Clinical Impressions(s) / ED Diagnoses   Final diagnoses:  None    New Prescriptions New Prescriptions   No medications on file     Lacretia Leigh, MD 12/13/16 1215

## 2016-12-13 NOTE — ED Notes (Signed)
Bed: MA00 Expected date:  Expected time:  Means of arrival:  Comments: EMS 83 y/o cough, SOB

## 2016-12-13 NOTE — H&P (Signed)
History and Physical  Makayla Fisher HUD:[id number] DOB: [birth year] DOA: 12/13/2016  Referring physician: Joseph Pierini, ER physician  PCP: [name], DO  Outpatient Specialists: None Patient coming from: Assisted living & is able to ambulate using a walker  Chief Complaint: SOB and dysuria   HPI: Makayla Fisher is a 83 y.o. female with medical history significant of hypertension and glaucoma who presents from assisted living facility with several days of dysuria, shortness of breath and nonproductive cough. Patient feels like she was getting weaker and weaker. She started on a brief course of Ceftin, but this did not improve any of her symptoms. She was then started on Macrobid which she only received about 1 day for feeling so bad, that she was sent over to the emergency room  ED Course: In the emergency room, patient was found by chest x-ray to have a developing left lower lobe pneumonia and also a very large UTI. Lactic acid level normal although pro-calcitonin level elevated at 7.62 and white count elevated at 18. Patient given IV fluids plus IV Zithromax and Rocephin in the emergency room. Hospitalist called for further evaluation.  Review of Systems: Patient seen after arrival to floor . Pt complains of hunger, weakness, cough, dysuria and some dyspnea with exertion. She also complains of bilateral eye irritation and redness.  Pt denies any headache, vision changes, dysphagia, chest pain, palpitations, wheezing, abdominal pain, hematuria, constipation, diarrhea, focal extremity numbness weakness or pain .  Review of systems are otherwise negative   Past Medical History:  Diagnosis Date  . GERD (gastroesophageal reflux disease)   . Heart attack ([place]) 2009  . History of blood transfusion 2009  . Iron deficiency anemia due to chronic blood loss 08/07/2014  . NHL (non-Hodgkin's lymphoma) ([place]) 10/18/2013   finished chemo dec 2015, maintenanance retuxin   Past Surgical History:    Procedure Laterality Date  . c section  1947, 1963  . CARDIAC SURGERY  2009  . CHOLECYSTECTOMY  1973  . COLON SURGERY  2014   Colostomy  . COLOSTOMY REVISION N/A 06/12/2014   Procedure: COLOSTOMY REVISION;  Surgeon: [name], MD;  Location: [facility];  Service: General;  Laterality: N/A;  . CORONARY ANGIOPLASTY WITH STENT PLACEMENT  2009  . CORONARY ARTERY BYPASS GRAFT  aug 2009    x3  . ESOPHAGOGASTRODUODENOSCOPY  2013  . EYE SURGERY Bilateral  10 years ago   lens replacments cataracts  . right chest pac  june 2015    Social History:  reports that she quit smoking about 53 years ago. Her smoking use included Cigarettes. She started smoking about 68 years ago. She has a 25.00 pack-year smoking history. She has never used smokeless tobacco. She reports that she does not drink alcohol or use drugs.   Allergies  Allergen Reactions  . Epipen [Epinephrine]     Heart palpitations  . Heparin     Unsure of side affects (maybe affected platelets per pt)  . Levaquin [Levofloxacin In D5w] Other (See Comments)    Weakness  . Chlorhexidine Gluconate Other (See Comments)    Pt declined use and suspects she may have an intolerance,   . Sulfa Antibiotics Rash    Family History  Problem Relation Age of Onset  . Diverticulitis Mother   . Heart attack Mother   . Ulcers Father       Prior to Admission medications   Medication Sig Start Date End Date Taking? Authorizing Provider  acetaminophen (TYLENOL) 500 MG tablet Take  500 mg by mouth 2 (two) times daily.   Yes [provider]  aspirin EC 81 MG tablet Take 81 mg by mouth 2 (two) times a week.    Yes [provider]  Calcium Carbonate-Vitamin D (CALCIUM 600+D) 600-400 MG-UNIT tablet Take 1 tablet by mouth daily.   Yes [provider]  Cranberry 425 MG CAPS Take 1 capsule by mouth daily.   Yes [provider]  fluconazole (DIFLUCAN) 100 MG tablet Take 1 tablet (100 mg total) by mouth daily. 10/26/15   Yes Ennever, [name], MD  levothyroxine (SYNTHROID, LEVOTHROID) 50 MCG tablet Take 50 mcg by mouth every morning. 10/08/15  Yes [provider]  lisinopril (PRINIVIL,ZESTRIL) 20 MG tablet Take 20 mg by mouth daily.   Yes [provider]  Multiple Vitamin (MULTIVITAMIN WITH MINERALS) TABS tablet Take 1 tablet by mouth daily.   Yes [provider]  Multiple Vitamins-Minerals (HAIR SKIN AND NAILS FORMULA PO) Take 1 tablet by mouth daily.   Yes [provider]  Multiple Vitamins-Minerals (RA VISION-VITE PRESERVE PO) Take 1 tablet by mouth 2 (two) times daily.    Yes [provider]  nitrofurantoin, macrocrystal-monohydrate, (MACROBID) 100 MG capsule Take 100 mg by mouth 2 (two) times daily. 12/09/16 12/15/16 Yes [provider]  olopatadine (PATANOL) 0.1 % ophthalmic solution Place 1 drop into both eyes 2 (two) times daily.   Yes [provider]  phenazopyridine (PYRIDIUM) 100 MG tablet Take 100 mg by mouth 2 (two) times daily.  12/10/16 12/13/16 Yes [provider]  rOPINIRole (REQUIP) 1 MG tablet Take 1 mg by mouth at bedtime.   Yes [provider]  saccharomyces boulardii (FLORASTOR) 250 MG capsule Take 250 mg by mouth 2 (two) times daily. x2 weeks 12/06/16  Yes [provider]  sertraline (ZOLOFT) 25 MG tablet Take 25 mg by mouth every morning.    Yes [provider]  SPIRIVA HANDIHALER 18 MCG inhalation capsule Place 18 mcg into inhaler and inhale as needed. 07/23/15  Yes [provider]    Physical Exam: BP (!) 128/108 (BP Location: Right Arm)   Pulse 88   Temp 98.4 F (36.9 C) (Oral)   Resp (!) 24   Ht 5' (1.524 m)   Wt 74.2 kg (163 lb 9.3 oz)   SpO2 95%   BMI 31.95 kg/m   General:  Alert and oriented 3, no acute distress other than fatigue  Eyes: Sclera nonicteric, however both eyes are a little bit injected  ENT: Normocephalic, atraumatic, mucous membranes are dry  Neck: Supple, no  JVD  Cardiovascular: Regular rate and rhythm, S1-S2  Respiratory: Decreased breath sounds left base with few crackles, breathing mildly labored  Abdomen: Soft, nontender, nondistended, hypoactive bowel sounds, noted colostomy which appears functioning. Ostomy is pink  Skin: No skin breaks, tears or lesions other than ostomy noted above  Musculoskeletal: No clubbing or cyanosis or edema  Psychiatric: Patient is appropriate, no evidence of psychoses  Neurologic: No focal deficits           Labs on Admission:  Basic Metabolic Panel:  Recent Labs Lab 12/13/16 1109  NA 139  K 3.4*  CL 103  CO2 21*  GLUCOSE 98  BUN 19  CREATININE 0.68  CALCIUM 9.5   Liver Function Tests:  Recent Labs Lab 12/13/16 1109  AST 36  ALT 27  ALKPHOS 101  BILITOT 0.9  PROT 6.0*  ALBUMIN 2.9*   No results for input(s): LIPASE, AMYLASE  in the last 168 hours. No results for input(s): AMMONIA in the last 168 hours. CBC:  Recent Labs Lab 12/13/16 1109  WBC 18.7*  NEUTROABS 16.8*  HGB 11.0*  HCT 32.7*  MCV 93.2  PLT 259   Cardiac Enzymes:  Recent Labs Lab 12/13/16 1109  TROPONINI 0.06*    BNP (last 3 results)  Recent Labs  12/13/16 1109  BNP 265.6*    ProBNP (last 3 results) No results for input(s): PROBNP in the last 8760 hours.  CBG: No results for input(s): GLUCAP in the last 168 hours.  Radiological Exams on Admission: Dg Chest 2 View  Result Date: 12/13/2016 CLINICAL DATA:  Increasing cough, shortness of breath, and chills for the past 2 days. History of non-Hodgkin's lymphoma, coronary artery disease with stent placement, former smoker. EXAM: CHEST  2 VIEW COMPARISON:  PA and lateral chest x-ray of Aug 25, 2015 FINDINGS: The lungs are reasonably well inflated. There patchy interstitial and alveolar opacities bilaterally. In addition confluent density in the retrocardiac region likely on the left is present. There is no pleural effusion or pneumothorax. The heart and  pulmonary vascularity are normal the patient has undergone previous median sternotomy and internal mammary artery dissection. There is calcification in the wall of the aortic arch. The mediastinum is normal in width. A power port catheter tip projects over the midportion of the SVC. There is stable wedge compression of approximately T8. IMPRESSION: Patchy interstitial and alveolar opacities bilaterally but greatest in the left lower lobe worrisome for pneumonia. No definite CHF. Thoracic aortic atherosclerosis. Electronically Signed   By: David  Martinique M.D.   On: 12/13/2016 12:08    EKG: Independently reviewed.  Ectopic atrial rhythm with reports of borderline shortened PR interval  Assessment/Plan Present on Admission: . CAP (community acquired pneumonia): Failed outpatient antibody therapy. Supplemental oxygen and IV antibiotics-IV Rocephin and Zithromax. Mild fluids for hydration . NHL (non-Hodgkin's lymphoma) ([place]): Stable . HTN (hypertension): Continuing oral medications from home . Acute lower UTI: Follow cultures. Should be covered by Rocephin . Glaucoma: Continue eyedrops Eye irritation: Does not look like acute glaucoma flare. Start with saline eyedrops. Could be conjunctivitis as patient was coughing into her hand and then rubbing her eyes. Start with saline eyedrops and if they are still injected by tomorrow, could consider optical antibiotics  Active Problems:   NHL (non-Hodgkin's lymphoma) ([place])   HTN (hypertension)   CAP (community acquired pneumonia)   Acute lower UTI   Glaucoma   Colostomy in place [facility])   DVT prophylaxis: SCDs   Code Status:  Full code as per patient  Family Communication: Daughter at the bedside   Disposition Plan: Here until pneumonia and UTI more stable. PT determine if can go back to ALF   Consults called: None   Admission status: Patient will require several days past 2 minutes of inpatient hospital services, admit as inpatient      Annita Brod MD [facility] Pager [phone]  If 7PM-7AM, please contact night-coverage [email or web address] Password [email or web address]  12/13/2016, 4:59 PM

## 2016-12-13 NOTE — Clinical Social Work Note (Signed)
Clinical Social Work Assessment  Patient Details  Name: Makayla Fisher MRN: [id number] Date of Birth: 12-23-1922  Date of referral:  12/13/16               Reason for consult:   (admitted from facility)                Permission sought to share information with:  Family Supports, Chartered certified accountant granted to share information::  Yes, Verbal Permission Granted  Name::     niece Makayla Fisher  Agency::  [facility]  Relationship::     Contact Information:     Housing/Transportation Living arrangements for the past 2 months:  [place] of Information:  Patient, Facility Patient Interpreter Needed:  None Criminal Activity/Legal Involvement Pertinent to Current Situation/Hospitalization:  No - Comment as needed Significant Relationships:  Other Family Members, [employer], Friend Lives with:  Facility Resident Do you feel safe going back to the place where you live?  Yes Need for family participation in patient care:  No (Coment)  Care giving concerns:  Pt is from [facility] where she has resided since 2015. States her husband lived in memory care unit until he passed away and she has been living there since that time. Pt and facility staff report pt is independent with mobility and ADLs. Uses rolling walker for stability but drives herself around and is "very active for her age." Facility states pt was being treated for UTI outpatient prior to this admission. Was not using O2 at home prior to admission.   Social Worker assessment / plan:  CSW consulted as pt is admitted from facility- [facility]. Met with pt at bedside. Pt alert and oriented x 4. Very pleasant and engaged in assessment. Reports she is pleased with her living situation at [facility] and plans to return there at DC. CSW made contact with facility who confirms pt's care needs (above) at baseline and that pt can return at DC.  Will provide updated  FL2 for facility at DC and follow for care needs.   Plan: Return to [facility] at DC.   Employment status:  Retired Forensic scientist:  Medicare PT Recommendations:  Not assessed at this time Information / Referral to community resources:     Patient/Family's Response to care:  Appreciative of care  Patient/Family's Understanding of and Emotional Response to Diagnosis, Current Treatment, and Prognosis:  Pt demonstrates thorough understanding of situation and potential plans. Pt jsut admitted therefore discussion re: after care needs is premature however pt reports her plan will be to return to her ALF where she is very happy and has good support. She also notes that her closest family in area is her niece and that she is aware of her hospital admission. Is worried as "niece had fall in ED and is awaiting her results" therefore asks CSW not call niece presently.   Emotional Assessment Appearance:  Appears younger than stated age Attitude/Demeanor/Rapport:   (pleasant, cooperative) Affect (typically observed):  Accepting, Adaptable Orientation:  Oriented to Self, Oriented to Place, Oriented to  Time, Oriented to Situation Alcohol / Substance use:  Not Applicable Psych involvement (Current and /or in the community):  No (Comment)  Discharge Needs  Concerns to be addressed:  Discharge Planning Concerns Readmission within the last 30 days:  No Current discharge risk:  None Barriers to Discharge:  Continued Medical Work up   [employer], LCSW 12/13/2016, 3:09 PM  [phone]

## 2016-12-14 ENCOUNTER — Inpatient Hospital Stay (HOSPITAL_COMMUNITY): Payer: Medicare Other

## 2016-12-14 LAB — CBC
HCT: 30.8 % — ABNORMAL LOW (ref 36.0–46.0)
HEMOGLOBIN: 10.2 g/dL — AB (ref 12.0–15.0)
MCH: 31 pg (ref 26.0–34.0)
MCHC: 33.1 g/dL (ref 30.0–36.0)
MCV: 93.6 fL (ref 78.0–100.0)
Platelets: 262 10*3/uL (ref 150–400)
RBC: 3.29 MIL/uL — ABNORMAL LOW (ref 3.87–5.11)
RDW: 14.1 % (ref 11.5–15.5)
WBC: 20.1 10*3/uL — ABNORMAL HIGH (ref 4.0–10.5)

## 2016-12-14 LAB — STREP PNEUMONIAE URINARY ANTIGEN: Strep Pneumo Urinary Antigen: NEGATIVE

## 2016-12-14 LAB — HIV ANTIBODY (ROUTINE TESTING W REFLEX): HIV SCREEN 4TH GENERATION: NONREACTIVE

## 2016-12-14 LAB — BASIC METABOLIC PANEL
ANION GAP: 7 (ref 5–15)
BUN: 18 mg/dL (ref 6–20)
CALCIUM: 8.5 mg/dL — AB (ref 8.9–10.3)
CHLORIDE: 105 mmol/L (ref 101–111)
CO2: 25 mmol/L (ref 22–32)
CREATININE: 0.59 mg/dL (ref 0.44–1.00)
GFR calc Af Amer: >60 mL/min (ref >60)
GFR calc non Af Amer: >60 mL/min (ref >60)
GLUCOSE: 108 mg/dL — AB (ref 65–99)
Potassium: 3.4 mmol/L — ABNORMAL LOW (ref 3.5–5.1)
Sodium: 137 mmol/L (ref 135–145)

## 2016-12-14 NOTE — Progress Notes (Signed)
PROGRESS NOTE    Makayla Fisher  CHE:[id number] DOB: [birth year] DOA: 12/13/2016 PCP: [name], DO  Brief Narrative: Makayla Fisher is a 83 y.o. female with medical history significant of hypertension and glaucoma who presented from assisted living facility with several days of dysuria, shortness of breath and nonproductive cough. She started on a brief course of Ceftin, then started on Macrobid which she only received about 1 day for feeling so bad, that she was sent over to the emergency room found by chest x-ray to have a developing left lower lobe pneumonia and also UTI. Lactic acid level normal although pro-calcitonin level elevated at 7.62 and white count elevated at 18   Assessment & Plan:   #1 community-acquired pneumonia -Failed outpatient antibiotic therapy -Continue IV ceftriaxone and azithromycin -Stop IV fluids today -Blood cultures negative for 24 hours  #2. Urinary tract infection -Urinalysis impressive with symptoms of dysuria and burning  -unfortunately urine cultures were not sent to the emergency room yesterday before antibiotics were started, will send now although will probably be of limited value -Continue ceftriaxone  #3. Non-Hodgkin's lymphoma -Stable  #4. Conjunctival irritation -Mild, monitor, continue with saline eyedrops if no improvement will start topical antibiotics  #5 .Hypertension -Stable, continue lisinopril  #6. Colostomy in place [facility])  DVT prophylaxis: add lovenox Code Status: Full [place] Family Communication: None at bedside Disposition Plan: home/ALF pending improvement  Consultants:   slp  Antimicrobials:   Ceftriaxone and azithromycin from 8/21   Subjective: Still coughing, mild shortness of breath  Objective: Vitals:   12/13/16 1420 12/13/16 2124 12/14/16 0645 12/14/16 0909  BP: (!) 128/108 (!) 155/80 (!) 154/87   Pulse: 88 76 65   Resp: (!) 24 20 20    Temp: 98.4 F (36.9 C) 98 F (36.7 C) 98.7 F (37.1 C)     TempSrc: Oral Oral Oral   SpO2: 95% 96% 97% 97%  Weight: 74.2 kg (163 lb 9.3 oz)     Height: 5' (1.524 m)       Intake/Output Summary (Last 24 hours) at 12/14/16 1431 Last data filed at 12/14/16 0200  Gross per 24 hour  Intake              120 ml  Output                0 ml  Net              120 ml   Filed Weights   12/13/16 1237 12/13/16 1420  Weight: 73.9 kg (163 lb) 74.2 kg (163 lb 9.3 oz)    Examination:  General exam: Elderly white female laying in bed, uncomfortable appearing Respiratory system: Rhonchi at both bases mildly increased respiratory effort Cardiovascular system: S1 & S2 heard, RRR. No JVD, murmurs,  Gastrointestinal system: Abdomen is nondistended, soft and nontender. Normal bowel sounds heard. Central nervous system: Alert and oriented. No focal neurological deficits. Extremities: Symmetric 5 x 5 power. Skin: No rashes, lesions or ulcers Psychiatry: Judgement and insight appear normal. Mood & affect appropriate.     Data Reviewed:   CBC:  Recent Labs Lab 12/13/16 1109 12/14/16 0457  WBC 18.7* 20.1*  NEUTROABS 16.8*  --   HGB 11.0* 10.2*  HCT 32.7* 30.8*  MCV 93.2 93.6  PLT 259 536   Basic Metabolic Panel:  Recent Labs Lab 12/13/16 1109 12/14/16 0457  NA 139 137  K 3.4* 3.4*  CL 103 105  CO2 21* 25  GLUCOSE 98 108*  BUN 19  18  CREATININE 0.68 0.59  CALCIUM 9.5 8.5*   GFR: Estimated Creatinine Clearance: 41.2 mL/min (by C-G formula based on SCr of 0.59 mg/dL). Liver Function Tests:  Recent Labs Lab 12/13/16 1109  AST 36  ALT 27  ALKPHOS 101  BILITOT 0.9  PROT 6.0*  ALBUMIN 2.9*   No results for input(s): LIPASE, AMYLASE in the last 168 hours. No results for input(s): AMMONIA in the last 168 hours. Coagulation Profile:  Recent Labs Lab 12/13/16 1109  INR 1.21   Cardiac Enzymes:  Recent Labs Lab 12/13/16 1109  TROPONINI 0.06*   BNP (last 3 results) No results for input(s): PROBNP in the last 8760  hours. HbA1C: No results for input(s): HGBA1C in the last 72 hours. CBG: No results for input(s): GLUCAP in the last 168 hours. Lipid Profile: No results for input(s): CHOL, HDL, LDLCALC, TRIG, CHOLHDL, LDLDIRECT in the last 72 hours. Thyroid Function Tests: No results for input(s): TSH, T4TOTAL, FREET4, T3FREE, THYROIDAB in the last 72 hours. Anemia Panel: No results for input(s): VITAMINB12, FOLATE, FERRITIN, TIBC, IRON, RETICCTPCT in the last 72 hours. Urine analysis:    Component Value Date/Time   COLORURINE AMBER (A) 12/13/2016 1125   APPEARANCEUR HAZY (A) 12/13/2016 1125   LABSPEC 1.017 12/13/2016 1125   PHURINE 5.0 12/13/2016 1125   GLUCOSEU NEGATIVE 12/13/2016 1125   HGBUR NEGATIVE 12/13/2016 1125   BILIRUBINUR NEGATIVE 12/13/2016 1125   KETONESUR 20 (A) 12/13/2016 1125   PROTEINUR 100 (A) 12/13/2016 1125   UROBILINOGEN 0.2 08/19/2013 1624   NITRITE POSITIVE (A) 12/13/2016 1125   LEUKOCYTESUR LARGE (A) 12/13/2016 1125   Sepsis Labs: @LABRCNTIP (procalcitonin:4,lacticidven:4)  ) Recent Results (from the past 240 hour(s))  Culture, blood (Routine x 2)     Status: None (Preliminary result)   Collection Time: 12/13/16 11:09 AM  Result Value Ref Range Status   Specimen Description BLOOD RIGHT HAND  Final   Special Requests   Final    BOTTLES DRAWN AEROBIC AND ANAEROBIC Blood Culture adequate volume   Culture   Final    NO GROWTH < 24 HOURS Performed at [facility], [place] [address] [place], [place] [address]    Report Status PENDING  Incomplete  Culture, blood (Routine x 2)     Status: None (Preliminary result)   Collection Time: 12/13/16 11:09 AM  Result Value Ref Range Status   Specimen Description BLOOD RIGHT HAND  Final   Special Requests   Final    BOTTLES DRAWN AEROBIC ONLY Blood Culture adequate volume   Culture   Final    NO GROWTH < 24 HOURS Performed at [facility], [place] [address], [place],  [address]    Report Status PENDING   Incomplete         Radiology Studies: Dg Chest 2 View  Result Date: 12/14/2016 CLINICAL DATA:  Shortness of breath.  Follow-up pneumonia. EXAM: CHEST  2 VIEW COMPARISON:  12/13/2016 FINDINGS: A right jugular Port-A-Cath terminates over the lower SVC. Sequelae of prior CABG are again identified. The cardiac silhouette is normal in size. Aortic atherosclerosis is noted. Patchy opacities are again seen throughout both lungs, overall similar to the prior study with at most minimal improvement in the right lung base and possible slight worsening in the left upper lobe. No sizable pleural effusion or pneumothorax is identified. A chronic midthoracic vertebral compression fracture is again noted. There surgical clips in the upper abdomen. IMPRESSION: At most minimal mixed interval changes of patchy bilateral  lung opacities concerning for pneumonia. Electronically Signed   By: Logan Bores M.D.   On: 12/14/2016 13:21   Dg Chest 2 View  Result Date: 12/13/2016 CLINICAL DATA:  Increasing cough, shortness of breath, and chills for the past 2 days. History of non-Hodgkin's lymphoma, coronary artery disease with stent placement, former smoker. EXAM: CHEST  2 VIEW COMPARISON:  PA and lateral chest x-ray of Aug 25, 2015 FINDINGS: The lungs are reasonably well inflated. There patchy interstitial and alveolar opacities bilaterally. In addition confluent density in the retrocardiac region likely on the left is present. There is no pleural effusion or pneumothorax. The heart and pulmonary vascularity are normal the patient has undergone previous median sternotomy and internal mammary artery dissection. There is calcification in the wall of the aortic arch. The mediastinum is normal in width. A power port catheter tip projects over the midportion of the SVC. There is stable wedge compression of approximately T8. IMPRESSION: Patchy interstitial and alveolar opacities bilaterally but greatest in the left lower lobe worrisome  for pneumonia. No definite CHF. Thoracic aortic atherosclerosis. Electronically Signed   By: David  Martinique M.D.   On: 12/13/2016 12:08        Scheduled Meds: . [START ON 12/15/2016] aspirin EC  81 mg Oral Once per day on Mon Thu  . levothyroxine  50 mcg Oral q morning - 10a  . lisinopril  20 mg Oral Daily  . LORazepam  0.25-0.5 mg Intravenous NOW  . olopatadine  1 drop Both Eyes BID  . rOPINIRole  1 mg Oral QHS  . saccharomyces boulardii  250 mg Oral BID  . sertraline  25 mg Oral q morning - 10a  . tiotropium  18 mcg Inhalation Daily   Continuous Infusions: . azithromycin    . cefTRIAXone (ROCEPHIN)  IV 1 g (12/14/16 1412)     LOS: 1 day    Time spent: 30min    Domenic Polite, MD [facility] Page via [email or web address], password [email or web address]  If 7PM-7AM, please contact night-coverage [email or web address] Password [facility] 12/14/2016, 2:31 PM

## 2016-12-14 NOTE — Evaluation (Signed)
SLP Cancellation Note  Patient Details Name: Artasia Thang MRN: [id number] DOB: 07/29/1922   Cancelled treatment:       Reason Eval/Treat Not Completed: Other (comment) (pt needs to be cleaned up and is to be taken to xray for CXR, she denies dysphagia, will continue efforts)  Luanna Salk, [facility] SLP [phone]  Macario Golds   12/14/2016, 11:56 AM

## 2016-12-14 NOTE — Progress Notes (Signed)
Physical Therapy Evaluation Patient Details Name: Makayla Fisher MRN: [id number] DOB: 1922-05-29 Today's Date: 12/14/2016   History of Present Illness  Pt is a 83 y.o. female with PMH of hypertension and glaucoma who presents from assisted living facility with several days of dysuria, shortness of breath and nonproductive cough. Pt reports feeling as though she was getting weaker and weaker. Pt admitted to the ED where imaging confirmed pneuomina and a UTI. Pt admitted for CAP.  Clinical Impression  Pt admitted with the above listed conditions. Pt currently with functional limitations due to the deficits listed below. Pt will benefit from skilled PT to increase their independence and safety with mobility to allow discharge. Pt tolerated session well. Pt was originally apprehensive about working with PT but agreed to sit at EOB and attempt transfer into recliner.     Follow Up Recommendations Home health PT;Supervision - Intermittent    Equipment Recommendations  None recommended by PT    Recommendations for Other Services       Precautions / Restrictions Precautions Precautions: Fall Restrictions Weight Bearing Restrictions: No      Mobility  Bed Mobility Overal bed mobility: Needs Assistance Bed Mobility: Supine to Sit     Supine to sit: Min assist     General bed mobility comments: Pt required min A to help raise and steady trunk at EOB  Transfers Overall transfer level: Needs assistance Equipment used: Rolling walker (2 wheeled) Transfers: Sit to/from Stand Sit to Stand: Min assist         General transfer comment: Pt required min A to rise but was able to steady by herself, pt stood and took several lateral steps to sit in recliner   Ambulation/Gait             General Gait Details: Not attempted at this time  Science writer    Modified Rankin (Stroke Patients Only)       Balance Overall balance assessment: Needs  assistance   Sitting balance-Leahy Scale: Good     Standing balance support: Bilateral upper extremity supported Standing balance-Leahy Scale: Poor Standing balance comment: Pt requires RW for bilat UE support                              Pertinent Vitals/Pain Pain Assessment: No/denies pain    Home Living Family/patient expects to be discharged to:: Assisted living               Home Equipment: [place] - 4 wheels      Prior Function Level of Independence: Independent with assistive device(s)         Comments: Pt reports using rollator for mobility but reports being independent with bathing, dressing, and driving.      Hand Dominance        Extremity/Trunk Assessment   Upper Extremity Assessment Upper Extremity Assessment: Overall WFL for tasks assessed    Lower Extremity Assessment Lower Extremity Assessment: Generalized weakness       Communication   Communication: No difficulties  Cognition Arousal/Alertness: Awake/alert Behavior During Therapy: WFL for tasks assessed/performed Overall Cognitive Status: Within Functional Limits for tasks assessed                                        General Comments  Exercises     Assessment/Plan    PT Assessment Patient needs continued PT services  PT Problem List Decreased mobility;Cardiopulmonary status limiting activity;Decreased safety awareness;Decreased knowledge of precautions;Decreased activity tolerance;Decreased strength;Decreased balance       PT Treatment Interventions DME instruction;Therapeutic activities;Gait training;Therapeutic exercise;Patient/family education;Balance training;Functional mobility training    PT Goals (Current goals can be found in the Care Plan section)  Acute Rehab PT Goals Patient Stated Goal: Pt would like to return to ALF  PT Goal Formulation: With patient Time For Goal Achievement: 12/28/16 Potential to Achieve Goals: Good     Frequency Min 2X/week   Barriers to discharge        Co-evaluation               AM-PAC PT "6 Clicks" Daily Activity  Outcome Measure Difficulty turning over in bed (including adjusting bedclothes, sheets and blankets)?: A Little Difficulty moving from lying on back to sitting on the side of the bed? : Unable Difficulty sitting down on and standing up from a chair with arms (e.g., wheelchair, bedside commode, etc,.)?: Unable Help needed moving to and from a bed to chair (including a wheelchair)?: A Little Help needed walking in hospital room?: A Little Help needed climbing 3-5 steps with a railing? : A Little 6 Click Score: 14    End of Session Equipment Utilized During Treatment: Oxygen (Simultaneous filing. User may not have seen previous data.) Activity Tolerance: Patient tolerated treatment well Patient left: in chair;with call bell/phone within reach;with family/visitor present;with chair alarm set   PT Visit Diagnosis: Difficulty in walking, not elsewhere classified (R26.2)    Time: 6153-7943 PT Time Calculation (min) (ACUTE ONLY): 18 min   Charges:   PT Evaluation $PT Eval Low Complexity: 1 Low     PT G CodesOlegario Shearer, SPT   Reino Bellis 12/14/2016, 3:45 PM

## 2016-12-15 LAB — CBC
HCT: 28.9 % — ABNORMAL LOW (ref 36.0–46.0)
Hemoglobin: 9.6 g/dL — ABNORMAL LOW (ref 12.0–15.0)
MCH: 31.4 pg (ref 26.0–34.0)
MCHC: 33.2 g/dL (ref 30.0–36.0)
MCV: 94.4 fL (ref 78.0–100.0)
PLATELETS: 275 10*3/uL (ref 150–400)
RBC: 3.06 MIL/uL — AB (ref 3.87–5.11)
RDW: 14.3 % (ref 11.5–15.5)
WBC: 13.1 10*3/uL — ABNORMAL HIGH (ref 4.0–10.5)

## 2016-12-15 LAB — BASIC METABOLIC PANEL
Anion gap: 5 (ref 5–15)
BUN: 20 mg/dL (ref 6–20)
CALCIUM: 8.2 mg/dL — AB (ref 8.9–10.3)
CO2: 28 mmol/L (ref 22–32)
CREATININE: 0.59 mg/dL (ref 0.44–1.00)
Chloride: 106 mmol/L (ref 101–111)
GFR calc Af Amer: >60 mL/min (ref >60)
GLUCOSE: 97 mg/dL (ref 65–99)
POTASSIUM: 3.8 mmol/L (ref 3.5–5.1)
SODIUM: 139 mmol/L (ref 135–145)

## 2016-12-15 LAB — URINE CULTURE: CULTURE: NO GROWTH

## 2016-12-15 LAB — PROCALCITONIN: Procalcitonin: 0.62 ng/mL

## 2016-12-15 MED ORDER — AZITHROMYCIN 250 MG PO TABS
500.0000 mg | ORAL_TABLET | ORAL | Status: DC
  Administered 2016-12-16 – 2016-12-19 (×4): 500 mg via ORAL
  Filled 2016-12-15 (×4): qty 2

## 2016-12-15 MED ORDER — ACETAMINOPHEN 325 MG PO TABS
650.0000 mg | ORAL_TABLET | Freq: Four times a day (QID) | ORAL | Status: DC | PRN
  Administered 2016-12-15 – 2016-12-19 (×7): 650 mg via ORAL
  Filled 2016-12-15 (×7): qty 2

## 2016-12-15 NOTE — Progress Notes (Signed)
CSW following as pt is admitted from facility- [facility]. Met with pt (sleeping), son and neice at bedside. Confirm again that pt's plan is to return to ALF at [place]. Pt is open to [facility] and family also expresses interest [facility]- state pt manages her ostomy bag independently but is "changing it 8-10 times a day which is irritating it." States they want to see if [facility] could aid with this. Also interested in hiring private duty aide to assist with supervision/ADLs. (state pt is capable of completing all independently but family has noticed that her "hygeine is getting slack" and feel aide's presence for prompting would help). CSW provided agency list. Family states pt's med tech at [employer] is "Carlyon Shadow" [phone] and "she knows the most about her care."  Notes pt was not on O2 at home prior to admission.   Will continue following- Plan to return to her ALF at DC.   Sharren Bridge, MSW, LCSW Clinical Social Work 12/15/2016 [phone]

## 2016-12-15 NOTE — Evaluation (Signed)
Clinical/Bedside Swallow Evaluation Patient Details  Name: Makayla Fisher MRN: [id number] Date of Birth: 1923-03-26  Today's Date: 12/15/2016 Time: SLP Start Time (ACUTE ONLY): 1140 SLP Stop Time (ACUTE ONLY): 1200 SLP Time Calculation (min) (ACUTE ONLY): 20 min  Past Medical History:  Past Medical History:  Diagnosis Date  . GERD (gastroesophageal reflux disease)   . Heart attack ([place]) 2009  . History of blood transfusion 2009  . Iron deficiency anemia due to chronic blood loss 08/07/2014  . NHL (non-Hodgkin's lymphoma) ([place]) 10/18/2013   finished chemo dec 2015, maintenanance retuxin   Past Surgical History:  Past Surgical History:  Procedure Laterality Date  . c section  1947, 1963  . CARDIAC SURGERY  2009  . CHOLECYSTECTOMY  1973  . COLON SURGERY  2014   Colostomy  . COLOSTOMY REVISION N/A 06/12/2014   Procedure: COLOSTOMY REVISION;  Surgeon: [name], MD;  Location: [facility];  Service: General;  Laterality: N/A;  . CORONARY ANGIOPLASTY WITH STENT PLACEMENT  2009  . CORONARY ARTERY BYPASS GRAFT  aug 2009    x3  . ESOPHAGOGASTRODUODENOSCOPY  2013  . EYE SURGERY Bilateral  10 years ago   lens replacments cataracts  . right chest pac  june 2015   HPI:  83 yo female adm to [facility] with PMH + for GERD, non-hodgkins lymphoma, iron deficiency anemia, prior smoker, glaucoma, HTN.  CXR showed LLL opacities left more right.  Swallow evaluation ordered.    Assessment / Plan / Recommendation Clinical Impression  Pt presents with functional oropharyngeal swallow based on clinical evaluation.  No indication of aspiration - pt does have a chronic cough that is productive and does not appear coorelated to po intake.  Recommend regular/thin diet.  Educated pt to aspiration precautions/mitigation strategies.  SLP brushed pt's dentures and placed them.  Note ? abrasion anterior tongue with redness around it - pt denies pain _ ? if she bit down on her tongue? No SLP follow up needed as all  education completed.  SLP Visit Diagnosis: Dysphagia, unspecified (R13.10)    Aspiration Risk  Mild aspiration risk    Diet Recommendation Regular;Thin liquid   Liquid Administration via: Cup;Straw Medication Administration: Whole meds with liquid Supervision: Patient able to self feed Compensations: Slow rate;Small sips/bites Postural Changes: Remain upright for at least 30 minutes after po intake;Seated upright at 90 degrees    Other  Recommendations Oral Care Recommendations: Oral care BID   Follow up Recommendations        Frequency and Duration            Prognosis        Swallow Study   General Date of Onset: 12/15/16 HPI: 83 yo female adm to [facility] with PMH + for GERD, non-hodgkins lymphoma, iron deficiency anemia, prior smoker, glaucoma, HTN.  CXR showed LLL opacities left more right.  Swallow evaluation ordered.  Type of Study: Bedside Swallow Evaluation Diet Prior to this Study: Regular;Thin liquids (2 gm Na) Temperature Spikes Noted: No Respiratory Status: Nasal cannula History of Recent Intubation: No Behavior/Cognition: Alert;Cooperative;Pleasant mood Oral Cavity Assessment: Dry (appearance of lesion on tip of tongue, ? bit down on tongue) Oral Cavity - Dentition: Dentures, top;Dentures, bottom Vision: Functional for self-feeding Self-Feeding Abilities: Able to feed self Patient Positioning: Upright in bed Baseline Vocal Quality: Normal Volitional Cough: Strong (productive) Volitional Swallow: Able to elicit    Oral/Motor/Sensory Function Overall Oral Motor/Sensory Function: Within functional limits   Ice Chips Ice chips: Not tested   Thin  Liquid Thin Liquid: Within functional limits Presentation: Straw;Cup;Self Fed    Nectar Thick Nectar Thick Liquid: Not tested   Honey Thick Honey Thick Liquid: Not tested   Puree Puree: Within functional limits Presentation: Self Fed;Spoon   Solid   GO   Solid: Within functional limits Presentation: Self Fredirick Lathe 12/15/2016,12:16 PM Luanna Salk, [place] SLP [phone]

## 2016-12-15 NOTE — Evaluation (Signed)
Occupational Therapy Evaluation Patient Details Name: Makayla Fisher MRN: [id number] DOB: 08/30/1922 Today's Date: 12/15/2016    History of Present Illness Pt is a 83 y.o. female with PMH of hypertension and glaucoma who presents from assisted living facility with several days of dysuria, shortness of breath and nonproductive cough. Pt reports feeling as though she was getting weaker and weaker. Pt admitted to the ED where imaging confirmed pneuomina and a UTI. Pt admitted for CAP.   Clinical Impression   Pt was admitted for the above. She lives at an ALF and is independent with adls at baseline. She currently needs min A and feels much weaker than baseline. Goals in acute are for supervision level.  Pt may need SNF if she does not reach supervision/mod I at time of d/c    Follow Up Recommendations  SNF (or assist for adls & mobility at ALF)    Equipment Recommendations   (to be further assessed)    Recommendations for Other Services       Precautions / Restrictions Precautions Precautions: Fall Restrictions Weight Bearing Restrictions: No      Mobility Bed Mobility Overal bed mobility: Needs Assistance Bed Mobility: Supine to Sit     Supine to sit: Min assist Sit to supine: Min assist (for legs back in bed)   General bed mobility comments: pt unable to get up from bed unassisted. Assist for trunk and HOB raised 20 degrees  Transfers Overall transfer level: Needs assistance Equipment used: Rolling walker (2 wheeled)   Sit to Stand: Min assist         General transfer comment: assist to rise and steady.  used RW to sidestep up [place]                                           ADL either performed or assessed with clinical judgement   ADL Overall ADL's : Needs assistance/impaired     Grooming: Set up;Sitting   Upper Body Bathing: Set up;Sitting   Lower Body Bathing: Minimal assistance;Sit to/from stand   Upper Body Dressing :  Set up;Sitting   Lower Body Dressing: Minimal assistance;Sit to/from stand                 General ADL Comments: pt sidestepped up [place]. did not want to sit up in chair due to fatique     Vision         Perception     Praxis      Pertinent Vitals/Pain Pain Assessment: No/denies pain     Hand Dominance     Extremity/Trunk Assessment Upper Extremity Assessment Upper Extremity Assessment: Generalized weakness           Communication Communication Communication: No difficulties   Cognition Arousal/Alertness: Awake/alert Behavior During Therapy: WFL for tasks assessed/performed Overall Cognitive Status: Within Functional Limits for tasks assessed                                     General Comments       Exercises     Shoulder Instructions      Home Living Family/patient expects to be discharged to:: Skilled nursing facility  Home Equipment: [place] - 4 wheels          Prior Functioning/Environment Level of Independence: Independent with assistive device(s)        Comments: pt is from ALF; she doesn't have assistance for mobility/adls        OT Problem List: Decreased strength;Decreased activity tolerance;Impaired balance (sitting and/or standing);Decreased knowledge of use of DME or AE      OT Treatment/Interventions: Self-care/ADL training;DME and/or AE instruction;Balance training;Patient/family education;Therapeutic activities    OT Goals(Current goals can be found in the care plan section) Acute Rehab OT Goals Patient Stated Goal: none stated.  Pt doesn't think she can manage at ALF OT Goal Formulation: With patient Time For Goal Achievement: 12/22/16 Potential to Achieve Goals: Good ADL Goals Pt Will Perform Lower Body Bathing: with supervision;sit to/from stand;with set-up Pt Will Perform Lower Body Dressing: with supervision;with set-up;sit to/from stand Pt Will Transfer to  Toilet: with supervision;ambulating;bedside commode (vs high toilet) Pt Will Perform Tub/Shower Transfer: with supervision;shower seat;ambulating;Shower transfer  OT Frequency: Min 2X/week   Barriers to D/C:            Co-evaluation              AM-PAC PT "6 Clicks" Daily Activity     Outcome Measure Help from another person eating meals?: None Help from another person taking care of personal grooming?: A Little Help from another person toileting, which includes using toliet, bedpan, or urinal?: A Little Help from another person bathing (including washing, rinsing, drying)?: A Little Help from another person to put on and taking off regular upper body clothing?: A Little Help from another person to put on and taking off regular lower body clothing?: A Little 6 Click Score: 19   End of Session    Activity Tolerance: Patient limited by fatigue Patient left: in bed;with bed alarm set;with call bell/phone within reach  OT Visit Diagnosis: Unsteadiness on feet (R26.81);Muscle weakness (generalized) (M62.81)                Time: 4332-9518 OT Time Calculation (min): 23 min Charges:  OT General Charges $OT Visit: 1 Procedure OT Evaluation $OT Eval Low Complexity: 1 Procedure G-Codes:     Lesle Chris, OTR/L [phone] 12/15/2016  Alarik Radu 12/15/2016, 3:18 PM

## 2016-12-15 NOTE — Progress Notes (Signed)
Spoke with pt's nephew and wife at bedside concerning [facility] needs. [facility] at home was selected for [facility]. Pt will need HHRN/PT/NA orders and face to face please.

## 2016-12-15 NOTE — Progress Notes (Signed)
PHARMACIST - PHYSICIAN COMMUNICATION CONCERNING: Antibiotic IV to Oral Route Change Policy  RECOMMENDATION: This patient is receiving azithromycin by the intravenous route.  Based on criteria approved by the Pharmacy and Therapeutics Committee, the antibiotic(s) is/are being converted to the equivalent oral dose form(s).   DESCRIPTION: These criteria include:  Patient being treated for a respiratory tract infection, urinary tract infection, cellulitis or clostridium difficile associated diarrhea if on metronidazole  The patient is not neutropenic and does not exhibit a GI malabsorption state  The patient is eating (either orally or via tube) and/or has been taking other orally administered medications for a least 24 hours  The patient is improving clinically and has a Tmax < 100.5  If you have questions about this conversion, please contact the Pharmacy Department  []   [phone] )  [facility] []   [phone] )  [facility] []   [phone] )  [facility] []   [phone] )  [facility] [x]   [phone] )  [place], PharmD, [place] Pager: [phone] 12/15/2016 1:09 PM

## 2016-12-15 NOTE — Progress Notes (Signed)
PROGRESS NOTE    Makayla Fisher  NIO:[id number] DOB: Aug 24, 1922 DOA: 12/13/2016 PCP: [name], DO  Brief Narrative: Makayla Fisher is a 83 y.o. female with medical history significant of hypertension and glaucoma who presented from assisted living facility with several days of dysuria, shortness of breath and nonproductive cough. She started on a brief course of Ceftin, then started on Macrobid which she only received about 1 day for feeling so bad, that she was sent over to the emergency room found by chest x-ray to have a developing left lower lobe pneumonia and also UTI. Lactic acid level normal although pro-calcitonin level elevated at 7.62 and white count elevated at 18   Assessment & Plan:   #1 community-acquired pneumonia -Failed outpatient antibiotic therapy -Continue IV ceftriaxone and azithromycin day 3 -Blood cultures negative -Sputum culture pending -Ambulate, out of bed, physical therapy evaluation -SLP evaluation today  #2. Urinary tract infection -Urinalysis impressive with symptoms of dysuria and burning  -unfortunately urine cultures were not sent from the emergency room on admission before antibiotics were started, this was sent 8/22 although will probably be of limited value -Continue ceftriaxone  #3. Non-Hodgkin's lymphoma -Stable  #4. Conjunctival irritation -Mild, monitor, continue with saline eyedrops if no improvement will start topical antibiotics  #5 .Hypertension -Stable, continue lisinopril  #6. Colostomy in place [facility])  DVT prophylaxis:  lovenox Code Status: Full [place] Family Communication: None at bedside Disposition Plan: home/ALF pending improvement  Consultants:   slp  Antimicrobials:   Ceftriaxone and azithromycin from 8/21   Subjective: Still coughing, mild shortness of breath  Objective: Vitals:   12/15/16 0512 12/15/16 0735 12/15/16 1105 12/15/16 1301  BP: 110/60  (!) 124/53 (!) 143/67  Pulse: 87  93 82  Resp: 20  20  18   Temp: 98.7 F (37.1 C)   98.5 F (36.9 C)  TempSrc: Oral   Oral  SpO2: 98% 95% 97% 95%  Weight:      Height:        Intake/Output Summary (Last 24 hours) at 12/15/16 1414 Last data filed at 12/15/16 0513  Gross per 24 hour  Intake              240 ml  Output              700 ml  Net             -460 ml   Filed Weights   12/13/16 1237 12/13/16 1420  Weight: 73.9 kg (163 lb) 74.2 kg (163 lb 9.3 oz)    Examination:  Gen: Awake, Alert, More lucid, no distress HEENT:  Neck supple, no JVD Lungs: rhonchi at both bases improved respiratory effort CVS: S1-S2, regular rate and rhythm no Gallops,Rubs or new Murmurs Abd: soft, Non tender, non distended, BS present Extremities: No Cyanosis, Clubbing or edema Skin: no new rashes Psychiatry: Judgement and insight appear normal. Mood & affect appropriate.     Data Reviewed:   CBC:  Recent Labs Lab 12/13/16 1109 12/14/16 0457 12/15/16 0400  WBC 18.7* 20.1* 13.1*  NEUTROABS 16.8*  --   --   HGB 11.0* 10.2* 9.6*  HCT 32.7* 30.8* 28.9*  MCV 93.2 93.6 94.4  PLT 259 262 818   Basic Metabolic Panel:  Recent Labs Lab 12/13/16 1109 12/14/16 0457 12/15/16 0400  NA 139 137 139  K 3.4* 3.4* 3.8  CL 103 105 106  CO2 21* 25 28  GLUCOSE 98 108* 97  BUN 19 18 20   CREATININE  0.68 0.59 0.59  CALCIUM 9.5 8.5* 8.2*   GFR: Estimated Creatinine Clearance: 41.2 mL/min (by C-G formula based on SCr of 0.59 mg/dL). Liver Function Tests:  Recent Labs Lab 12/13/16 1109  AST 36  ALT 27  ALKPHOS 101  BILITOT 0.9  PROT 6.0*  ALBUMIN 2.9*   No results for input(s): LIPASE, AMYLASE in the last 168 hours. No results for input(s): AMMONIA in the last 168 hours. Coagulation Profile:  Recent Labs Lab 12/13/16 1109  INR 1.21   Cardiac Enzymes:  Recent Labs Lab 12/13/16 1109  TROPONINI 0.06*   BNP (last 3 results) No results for input(s): PROBNP in the last 8760 hours. HbA1C: No results for input(s): HGBA1C in the  last 72 hours. CBG: No results for input(s): GLUCAP in the last 168 hours. Lipid Profile: No results for input(s): CHOL, HDL, LDLCALC, TRIG, CHOLHDL, LDLDIRECT in the last 72 hours. Thyroid Function Tests: No results for input(s): TSH, T4TOTAL, FREET4, T3FREE, THYROIDAB in the last 72 hours. Anemia Panel: No results for input(s): VITAMINB12, FOLATE, FERRITIN, TIBC, IRON, RETICCTPCT in the last 72 hours. Urine analysis:    Component Value Date/Time   COLORURINE AMBER (A) 12/13/2016 1125   APPEARANCEUR HAZY (A) 12/13/2016 1125   LABSPEC 1.017 12/13/2016 1125   PHURINE 5.0 12/13/2016 1125   GLUCOSEU NEGATIVE 12/13/2016 1125   HGBUR NEGATIVE 12/13/2016 1125   BILIRUBINUR NEGATIVE 12/13/2016 1125   KETONESUR 20 (A) 12/13/2016 1125   PROTEINUR 100 (A) 12/13/2016 1125   UROBILINOGEN 0.2 08/19/2013 1624   NITRITE POSITIVE (A) 12/13/2016 1125   LEUKOCYTESUR LARGE (A) 12/13/2016 1125   Sepsis Labs: @LABRCNTIP (procalcitonin:4,lacticidven:4)  ) Recent Results (from the past 240 hour(s))  Culture, blood (Routine x 2)     Status: None (Preliminary result)   Collection Time: 12/13/16 11:09 AM  Result Value Ref Range Status   Specimen Description BLOOD RIGHT HAND  Final   Special Requests   Final    BOTTLES DRAWN AEROBIC AND ANAEROBIC Blood Culture adequate volume   Culture   Final    NO GROWTH 2 DAYS Performed at [facility], [place] [address] [place], [place] [address]    Report Status PENDING  Incomplete  Culture, blood (Routine x 2)     Status: None (Preliminary result)   Collection Time: 12/13/16 11:09 AM  Result Value Ref Range Status   Specimen Description BLOOD RIGHT HAND  Final   Special Requests   Final    BOTTLES DRAWN AEROBIC ONLY Blood Culture adequate volume   Culture   Final    NO GROWTH 2 DAYS Performed at [facility], [place] [address], [place], [place] [address]    Report Status PENDING  Incomplete         Radiology Studies: Dg Chest 2  View  Result Date: 12/14/2016 CLINICAL DATA:  Shortness of breath.  Follow-up pneumonia. EXAM: CHEST  2 VIEW COMPARISON:  12/13/2016 FINDINGS: A right jugular Port-A-Cath terminates over the lower SVC. Sequelae of prior CABG are again identified. The cardiac silhouette is normal in size. Aortic atherosclerosis is noted. Patchy opacities are again seen throughout both lungs, overall similar to the prior study with at most minimal improvement in the right lung base and possible slight worsening in the left upper lobe. No sizable pleural effusion or pneumothorax is identified. A chronic midthoracic vertebral compression fracture is again noted. There surgical clips in the upper abdomen. IMPRESSION: At most minimal mixed interval changes of patchy bilateral lung opacities concerning  for pneumonia. Electronically Signed   By: Logan Bores M.D.   On: 12/14/2016 13:21        Scheduled Meds: . aspirin EC  81 mg Oral Once per day on Mon Thu  . azithromycin  500 mg Oral Q24H  . levothyroxine  50 mcg Oral q morning - 10a  . lisinopril  20 mg Oral Daily  . olopatadine  1 drop Both Eyes BID  . rOPINIRole  1 mg Oral QHS  . saccharomyces boulardii  250 mg Oral BID  . sertraline  25 mg Oral q morning - 10a  . tiotropium  18 mcg Inhalation Daily   Continuous Infusions: . cefTRIAXone (ROCEPHIN)  IV Stopped (12/15/16 1258)     LOS: 2 days    Time spent: 58min    Domenic Polite, MD [facility] Page via [email or web address], password [email or web address]  If 7PM-7AM, please contact night-coverage [email or web address] Password [facility] 12/15/2016, 2:14 PM

## 2016-12-16 LAB — BASIC METABOLIC PANEL
ANION GAP: 7 (ref 5–15)
BUN: 18 mg/dL (ref 6–20)
CALCIUM: 8.3 mg/dL — AB (ref 8.9–10.3)
CO2: 26 mmol/L (ref 22–32)
Chloride: 104 mmol/L (ref 101–111)
Creatinine, Ser: 0.45 mg/dL (ref 0.44–1.00)
GFR calc Af Amer: >60 mL/min (ref >60)
GLUCOSE: 94 mg/dL (ref 65–99)
POTASSIUM: 3.9 mmol/L (ref 3.5–5.1)
SODIUM: 137 mmol/L (ref 135–145)

## 2016-12-16 LAB — CBC
HCT: 30.4 % — ABNORMAL LOW (ref 36.0–46.0)
Hemoglobin: 10 g/dL — ABNORMAL LOW (ref 12.0–15.0)
MCH: 31.2 pg (ref 26.0–34.0)
MCHC: 32.9 g/dL (ref 30.0–36.0)
MCV: 94.7 fL (ref 78.0–100.0)
PLATELETS: 284 10*3/uL (ref 150–400)
RBC: 3.21 MIL/uL — AB (ref 3.87–5.11)
RDW: 14.2 % (ref 11.5–15.5)
WBC: 10 10*3/uL (ref 4.0–10.5)

## 2016-12-16 LAB — EXPECTORATED SPUTUM ASSESSMENT W REFEX TO RESP CULTURE

## 2016-12-16 LAB — EXPECTORATED SPUTUM ASSESSMENT W GRAM STAIN, RFLX TO RESP C

## 2016-12-16 MED ORDER — FUROSEMIDE 20 MG PO TABS
20.0000 mg | ORAL_TABLET | Freq: Once | ORAL | Status: AC
  Administered 2016-12-16: 20 mg via ORAL
  Filled 2016-12-16: qty 1

## 2016-12-16 MED ORDER — LORAZEPAM 0.5 MG PO TABS
0.5000 mg | ORAL_TABLET | Freq: Two times a day (BID) | ORAL | Status: DC | PRN
  Administered 2016-12-16 – 2016-12-19 (×6): 0.5 mg via ORAL
  Filled 2016-12-16 (×6): qty 1

## 2016-12-16 MED ORDER — PSYLLIUM 95 % PO PACK
1.0000 | PACK | Freq: Every day | ORAL | Status: DC
  Administered 2016-12-16 – 2016-12-19 (×4): 1 via ORAL
  Filled 2016-12-16 (×4): qty 1

## 2016-12-16 NOTE — Progress Notes (Signed)
Physical Therapy Treatment Patient Details Name: Makayla Fisher MRN: [id number] DOB: 03/16/1923 Today's Date: 12/16/2016    History of Present Illness Pt is a 83 y.o. female with PMH of hypertension and glaucoma who presents from assisted living facility with several days of dysuria, shortness of breath and nonproductive cough. Pt reports feeling as though she was getting weaker and weaker. Pt admitted to the ED where imaging confirmed pneumonia and a UTI. Pt admitted for CAP.    PT Comments    Pt tolerated treatment session well. Pt willing and able to attempt ambulation. Pt ambulated 80 ft before SPO2 dropped to 85% on RA. PT recommending SNF because pt is min A and may require O2.   Follow Up Recommendations  SNF;Supervision/Assistance - 24 hour     Equipment Recommendations  None recommended by PT    Recommendations for Other Services       Precautions / Restrictions Precautions Precautions: Fall Precaution Comments: LLQ colostomy, monitor sats Restrictions Weight Bearing Restrictions: No    Mobility  Bed Mobility Overal bed mobility: Needs Assistance Bed Mobility: Supine to Sit     Supine to sit: Min assist;HOB elevated     General bed mobility comments: Pt requires UE support and min A to raise trunk to sit at EOB   Transfers Overall transfer level: Needs assistance Equipment used: Rolling walker (2 wheeled) Transfers: Sit to/from Stand Sit to Stand: Min assist         General transfer comment: min A to rise and steady trunk  Ambulation/Gait Ambulation/Gait assistance: Min guard Ambulation Distance (Feet): 80 Feet Assistive device: Rolling walker (2 wheeled) Gait Pattern/deviations: Step-through pattern;Decreased stride length;Trunk flexed Gait velocity: decr   General Gait Details: Pt ambulated 80 ft on RA, SPO2 dropped to 85% after 80 ft of ambulation, pt reported feeling fatigued and was anxious about her colostomy bag, pt sat on recliner and  returned to room and put back on 2.5 L O2/min (RN aware)   Stairs            Wheelchair Mobility    Modified Rankin (Stroke Patients Only)       Balance                                            Cognition Arousal/Alertness: Awake/alert Behavior During Therapy: WFL for tasks assessed/performed Overall Cognitive Status: Within Functional Limits for tasks assessed                                        Exercises      General Comments        Pertinent Vitals/Pain Pain Assessment: No/denies pain    Home Living                      Prior Function            PT Goals (current goals can now be found in the care plan section) Progress towards PT goals: Progressing toward goals    Frequency    Min 2X/week      PT Plan Current plan remains appropriate    Co-evaluation              AM-PAC PT "6 Clicks" Daily Activity  Outcome Measure  Difficulty turning over  in bed (including adjusting bedclothes, sheets and blankets)?: A Little Difficulty moving from lying on back to sitting on the side of the bed? : Unable Difficulty sitting down on and standing up from a chair with arms (e.g., wheelchair, bedside commode, etc,.)?: Unable Help needed moving to and from a bed to chair (including a wheelchair)?: A Little Help needed walking in hospital room?: A Little Help needed climbing 3-5 steps with a railing? : A Little 6 Click Score: 14    End of Session Equipment Utilized During Treatment: Gait belt;Oxygen Activity Tolerance: Patient tolerated treatment well Patient left: in chair;with chair alarm set;with nursing/sitter in room;with call bell/phone within reach Nurse Communication: Mobility status PT Visit Diagnosis: Difficulty in walking, not elsewhere classified (R26.2)     Time: 9201-0071 PT Time Calculation (min) (ACUTE ONLY): 24 min  Charges:  $Gait Training: 8-22 mins                    G Codes:        Olegario Shearer, SPT    Reino Bellis 12/16/2016, 3:51 PM

## 2016-12-16 NOTE — Progress Notes (Signed)
PROGRESS NOTE    Makayla Fisher  HDQ:[id number] DOB: 06-06-1922 DOA: 12/13/2016 PCP: [name], DO  Brief Narrative: Makayla Fisher is a 83 y.o. female with medical history significant of hypertension and glaucoma who presented from assisted living facility with several days of dysuria, shortness of breath and nonproductive cough. She started on a brief course of Ceftin, then started on Macrobid which she only received about 1 day for feeling so bad, that she was sent over to the emergency room found by chest x-ray to have a developing left lower lobe pneumonia and also UTI. Lactic acid level normal although pro-calcitonin level elevated at 7.62 and white count elevated at 18   Assessment & Plan:   #1 Community-acquired pneumonia -Failed outpatient antibiotic therapy -Continue IV ceftriaxone and azithromycin day 4, change to PO Vantin tomorrow -Blood cultures negative -Sputum culture pending -Ambulate, out of bed, physical therapy evaluation -SLP evaluation completed-mild aspiration risk  #2. Urinary tract infection -Urinalysis impressive with symptoms of dysuria and burning  -unfortunately urine cultures were not sent from the emergency room on admission before antibiotics were started, this was sent 8/22 although will probably be of limited value -Continue ceftriaxone for above anyway -urine Cx negative  #3. Non-Hodgkin's lymphoma -Stable  #4. Conjunctival irritation -Mild, monitor, continue with saline eyedrops if no improvement will start topical antibiotics  #5 .Hypertension -Stable, continue lisinopril  #6. Colostomy in place [facility])  DVT prophylaxis:  lovenox Code Status: Full COde Family Communication: None at bedside Disposition Plan: back to ALF with [facility] services   Consultants:   slp  Antimicrobials:   Ceftriaxone and azithromycin from 8/21   Subjective: -improving, cough better, less dyspneic  Objective: Vitals:   12/15/16 2342 12/16/16 0548  12/16/16 0859 12/16/16 1445  BP: (!) 138/50 (!) 124/58  (!) 151/52  Pulse: 75 70  68  Resp: 18 18  18   Temp: 98.9 F (37.2 C) 98.3 F (36.8 C)  98.5 F (36.9 C)  TempSrc: Oral Oral  Oral  SpO2: 95% 100% 93% 96%  Weight:      Height:        Intake/Output Summary (Last 24 hours) at 12/16/16 1537 Last data filed at 12/16/16 1194  Gross per 24 hour  Intake               10 ml  Output             1050 ml  Net            -1040 ml   Filed Weights   12/13/16 1237 12/13/16 1420  Weight: 73.9 kg (163 lb) 74.2 kg (163 lb 9.3 oz)    Examination:  Gen: Awake, Alert, More lucid, no distress HEENT:  Neck supple, no JVD Lungs: basilar ronchi CVS: S1-S2, regular rate and rhythm no Gallops,Rubs or new Murmurs Abd: soft, Non tender, non distended, BS present Extremities: No Cyanosis, Clubbing or edema Skin: no new rashes Psychiatry: Judgement and insight appear normal. Mood & affect appropriate.     Data Reviewed:   CBC:  Recent Labs Lab 12/13/16 1109 12/14/16 0457 12/15/16 0400 12/16/16 0903  WBC 18.7* 20.1* 13.1* 10.0  NEUTROABS 16.8*  --   --   --   HGB 11.0* 10.2* 9.6* 10.0*  HCT 32.7* 30.8* 28.9* 30.4*  MCV 93.2 93.6 94.4 94.7  PLT 259 262 275 174   Basic Metabolic Panel:  Recent Labs Lab 12/13/16 1109 12/14/16 0457 12/15/16 0400 12/16/16 0903  NA 139 137 139 137  K 3.4* 3.4* 3.8 3.9  CL 103 105 106 104  CO2 21* 25 28 26   GLUCOSE 98 108* 97 94  BUN 19 18 20 18   CREATININE 0.68 0.59 0.59 0.45  CALCIUM 9.5 8.5* 8.2* 8.3*   GFR: Estimated Creatinine Clearance: 41.2 mL/min (by C-G formula based on SCr of 0.45 mg/dL). Liver Function Tests:  Recent Labs Lab 12/13/16 1109  AST 36  ALT 27  ALKPHOS 101  BILITOT 0.9  PROT 6.0*  ALBUMIN 2.9*   No results for input(s): LIPASE, AMYLASE in the last 168 hours. No results for input(s): AMMONIA in the last 168 hours. Coagulation Profile:  Recent Labs Lab 12/13/16 1109  INR 1.21   Cardiac  Enzymes:  Recent Labs Lab 12/13/16 1109  TROPONINI 0.06*   BNP (last 3 results) No results for input(s): PROBNP in the last 8760 hours. HbA1C: No results for input(s): HGBA1C in the last 72 hours. CBG: No results for input(s): GLUCAP in the last 168 hours. Lipid Profile: No results for input(s): CHOL, HDL, LDLCALC, TRIG, CHOLHDL, LDLDIRECT in the last 72 hours. Thyroid Function Tests: No results for input(s): TSH, T4TOTAL, FREET4, T3FREE, THYROIDAB in the last 72 hours. Anemia Panel: No results for input(s): VITAMINB12, FOLATE, FERRITIN, TIBC, IRON, RETICCTPCT in the last 72 hours. Urine analysis:    Component Value Date/Time   COLORURINE AMBER (A) 12/13/2016 1125   APPEARANCEUR HAZY (A) 12/13/2016 1125   LABSPEC 1.017 12/13/2016 1125   PHURINE 5.0 12/13/2016 1125   GLUCOSEU NEGATIVE 12/13/2016 1125   HGBUR NEGATIVE 12/13/2016 1125   BILIRUBINUR NEGATIVE 12/13/2016 1125   KETONESUR 20 (A) 12/13/2016 1125   PROTEINUR 100 (A) 12/13/2016 1125   UROBILINOGEN 0.2 08/19/2013 1624   NITRITE POSITIVE (A) 12/13/2016 1125   LEUKOCYTESUR LARGE (A) 12/13/2016 1125   Sepsis Labs: @LABRCNTIP (procalcitonin:4,lacticidven:4)  ) Recent Results (from the past 240 hour(s))  Culture, blood (Routine x 2)     Status: None (Preliminary result)   Collection Time: 12/13/16 11:09 AM  Result Value Ref Range Status   Specimen Description BLOOD RIGHT HAND  Final   Special Requests   Final    BOTTLES DRAWN AEROBIC AND ANAEROBIC Blood Culture adequate volume   Culture   Final    NO GROWTH 3 DAYS Performed at [facility], [place] [address], [place], [place] [address]    Report Status PENDING  Incomplete  Culture, blood (Routine x 2)     Status: None (Preliminary result)   Collection Time: 12/13/16 11:09 AM  Result Value Ref Range Status   Specimen Description BLOOD RIGHT HAND  Final   Special Requests   Final    BOTTLES DRAWN AEROBIC ONLY Blood Culture adequate volume   Culture   Final     NO GROWTH 3 DAYS Performed at [facility], [place] [address] [place], [place] [address]    Report Status PENDING  Incomplete  Culture, Urine     Status: None   Collection Time: 12/14/16  4:44 PM  Result Value Ref Range Status   Specimen Description URINE, CLEAN CATCH  Final   Special Requests NONE  Final   Culture   Final    NO GROWTH Performed at [facility], [place] [address] [place], [place] [address]    Report Status 12/15/2016 FINAL  Final         Radiology Studies: No results found.      Scheduled Meds: . aspirin EC  81 mg Oral Once  per day on Mon Thu  . azithromycin  500 mg Oral Q24H  . levothyroxine  50 mcg Oral q morning - 10a  . lisinopril  20 mg Oral Daily  . olopatadine  1 drop Both Eyes BID  . psyllium  1 packet Oral Daily  . rOPINIRole  1 mg Oral QHS  . saccharomyces boulardii  250 mg Oral BID  . sertraline  25 mg Oral q morning - 10a  . tiotropium  18 mcg Inhalation Daily   Continuous Infusions: . cefTRIAXone (ROCEPHIN)  IV 1 g (12/16/16 1401)     LOS: 3 days    Time spent: 63min    Domenic Polite, MD [facility] Page via [email or web address], password [email or web address]  If 7PM-7AM, please contact night-coverage [email or web address] Password [facility] 12/16/2016, 3:37 PM

## 2016-12-16 NOTE — Progress Notes (Signed)
Acknowledged consult for ST rehab. CSW has been following as pt admitted from facility- [facility]. Discussed details of SNF placement with pt/family at length. Discussed that skilled nursing placement at this point would be private pay as [facility] requires PT need for SNF or other skilled need which at this time is not present. Family states they are hiring addition private duty aides to assist pt at [place] as well as agree to have pt set up with HHPT and Posey at [place]. Feel at this time pt's needs can continue to be met at ALF. Spoke with ALF and discussed above as well. Plan to return to ALF with home health and private duty aid assistance at [place]. Will follow and assist.  Sharren Bridge, MSW, LCSW Clinical Social Work 12/16/2016 [phone]

## 2016-12-16 NOTE — Progress Notes (Signed)
OT Cancellation Note  Patient Details Name: Burnette Valenti MRN: [id number] DOB: [birth year]   Cancelled Treatment:    Reason Eval/Treat Not Completed: Other (comment). Pt wss tired from getting cleaned up. Will try to return if schedule permits.  Agnieszka Newhouse 12/16/2016, 12:40 PM  Lesle Chris, OTR/L [phone] 12/16/2016

## 2016-12-17 LAB — PROCALCITONIN: PROCALCITONIN: 0.28 ng/mL

## 2016-12-17 MED ORDER — CEFPODOXIME PROXETIL 200 MG PO TABS
200.0000 mg | ORAL_TABLET | Freq: Two times a day (BID) | ORAL | Status: DC
  Administered 2016-12-17 – 2016-12-19 (×5): 200 mg via ORAL
  Filled 2016-12-17 (×5): qty 1

## 2016-12-17 NOTE — Progress Notes (Signed)
Pt in bed resting. SPO2 96%1LNC. Removed O2. After 10 minutes pt 94-95% RA. Denies SOB or trouble breathing.

## 2016-12-17 NOTE — Progress Notes (Signed)
Occupational Therapy Treatment Patient Details Name: Traniece Boffa MRN: [id number] DOB: [birth year] Today's Date: 12/17/2016    History of present illness Pt is a 83 y.o. female with PMH of hypertension and glaucoma who presents from assisted living facility with several days of dysuria, shortness of breath and nonproductive cough. Pt reports feeling as though she was getting weaker and weaker. Pt admitted to the ED where imaging confirmed pneumonia and a UTI. Pt admitted for CAP.   OT comments  Pt seen today for toileting. She continues to need min A for bed mobility and for sit to stand. At baseline, pt is very independent, including managing her own colostomy bag.  The bags here are different and she needs assistance.  Pt fatiques quickly and needs assistance for adls and mobility.  I continue to recommend SNF.   Follow Up Recommendations  SNF   If pt returns to ALF, she needs assistance for bed mobility, sit to stand and ADLs.   Equipment Recommendations  3 in 1 bedside commode (pt reports hers is rusty)   Recommendations for Other Services      Precautions / Restrictions Precautions Precautions: Fall Precaution Comments: LLQ colostomy, monitor sats Restrictions Weight Bearing Restrictions: No       Mobility Bed Mobility        Min A for trunk to sit up          Transfers   Equipment used: Rolling walker (2 wheeled)   Sit to Stand: Min assist Stand pivot transfers: Min guard       General transfer comment: assist to rise and steady.    Balance                                           ADL either performed or assessed with clinical judgement   ADL       Grooming: Wash/dry hands;Brushing hair;Sitting;Set up                   Toilet Transfer: Minimal assistance;Stand-pivot;BSC;RW   Toileting- Clothing Manipulation and Hygiene: Moderate assistance;Sit to/from stand         General ADL Comments: Pt transferred to 3:1  commode then into chair after hygiene. Pt on 1 liter of 02 and sats 91-92%  ADL was already performed prior to OT's arrival     Vision       Perception     Praxis      Cognition Arousal/Alertness: Awake/alert Behavior During Therapy: WFL for tasks assessed/performed Overall Cognitive Status: Within Functional Limits for tasks assessed                                          Exercises     Shoulder Instructions       General Comments      Pertinent Vitals/ Pain       Pain Assessment: No/denies pain  Home Living                                          Prior Functioning/Environment              Frequency  Min 2X/week  Progress Toward Goals  OT Goals(current goals can now be found in the care plan section)  Progress towards OT goals: Progressing toward goals (slowly)  Acute Rehab OT Goals Time For Goal Achievement: 12/22/16  Plan      Co-evaluation                 AM-PAC PT "6 Clicks" Daily Activity     Outcome Measure   Help from another person eating meals?: None Help from another person taking care of personal grooming?: A Little Help from another person toileting, which includes using toliet, bedpan, or urinal?: A Lot Help from another person bathing (including washing, rinsing, drying)?: A Little Help from another person to put on and taking off regular upper body clothing?: A Little Help from another person to put on and taking off regular lower body clothing?: A Little 6 Click Score: 18    End of Session    OT Visit Diagnosis: Unsteadiness on feet (R26.81);Muscle weakness (generalized) (M62.81)   Activity Tolerance Patient limited by fatigue   Patient Left in chair;with call bell/phone within reach;with chair alarm set;with family/visitor present   Nurse Communication          Time: 0981-1914 OT Time Calculation (min): 27 min  Charges: OT General Charges $OT Visit: 1 Procedure OT  Treatments $Self Care/Home Management : 8-22 mins $Therapeutic Activity: 8-22 mins  Lesle Chris, OTR/L [phone] 12/17/2016   Daxtin Leiker 12/17/2016, 12:45 PM

## 2016-12-17 NOTE — Progress Notes (Signed)
PROGRESS NOTE    Makayla Fisher  EHM:[id number] DOB: Sep 20, 1922 DOA: 12/13/2016 PCP: [name], DO  Brief Narrative: Makayla Fisher is a 83 y.o. female with medical history significant of hypertension and glaucoma who presented from assisted living facility with several days of dysuria, shortness of breath and nonproductive cough. She started on a brief course of Ceftin, then started on Macrobid which she only received about 1 day for feeling so bad, that she was sent over to the emergency room found by chest x-ray to have a developing left lower lobe pneumonia and also UTI. Lactic acid level normal although pro-calcitonin level elevated at 7.62 and white count elevated at 18   Assessment & Plan:   #1 Community-acquired pneumonia -Failed outpatient antibiotic therapy -Treated with IV ceftriaxone and azithromycin day 5 -change to PO Vantin today -Blood cultures negative -Sputum culture -likely oral flora -Ambulate, out of bed, physical therapy evaluation completed, SNF recommended however patient and nephew would like her to go back to her assisted living with home health physical therapy services -SLP evaluation completed-mild aspiration risk  #2. Urinary tract infection -Urinalysis impressive with symptoms of dysuria and burning  -unfortunately urine cultures were not sent from the emergency room on admission before antibiotics were started, this was sent 8/22 although of limited value -urine Cx negative  #3. Non-Hodgkin's lymphoma -Stable  #4. Conjunctival irritation -Resolved  #5 .Hypertension -Stable, continue lisinopril  #6. Colostomy in place [facility]) -Secondary to diverticulitis  DVT prophylaxis:  lovenox Code Status: Full COde Family Communication: None at bedside Disposition Plan: back to ALF with [facility] services tomorrow  Consultants:   slp  Antimicrobials:   Ceftriaxone and azithromycin from 8/21   Subjective: -Feels better, breathing continues to  improve, doesn't feel well enough to go home today  Objective: Vitals:   12/16/16 0859 12/16/16 1445 12/16/16 2232 12/17/16 0449  BP:  (!) 151/52 (!) 150/58 (!) 121/41  Pulse:  68 72 71  Resp:  18 19 20   Temp:  98.5 F (36.9 C) 98.3 F (36.8 C) 98.6 F (37 C)  TempSrc:  Oral Oral Oral  SpO2: 93% 96% 97% 97%  Weight:      Height:        Intake/Output Summary (Last 24 hours) at 12/17/16 1308 Last data filed at 12/17/16 0451  Gross per 24 hour  Intake              230 ml  Output              750 ml  Net             -520 ml   Filed Weights   12/13/16 1237 12/13/16 1420  Weight: 73.9 kg (163 lb) 74.2 kg (163 lb 9.3 oz)    Examination:  Gen: Elderly frail female, no distress HEENT: PERRLA, Neck supple, no JVD Lungs: Few basilar rhonchi CVS: RRR,No Gallops,Rubs or new Murmurs Abd: soft, Non tender, non distended, BS present Extremities: No Cyanosis, Clubbing or edema Skin: no new rashes Psychiatry: Judgement and insight appear normal. Mood & affect appropriate.     Data Reviewed:   CBC:  Recent Labs Lab 12/13/16 1109 12/14/16 0457 12/15/16 0400 12/16/16 0903  WBC 18.7* 20.1* 13.1* 10.0  NEUTROABS 16.8*  --   --   --   HGB 11.0* 10.2* 9.6* 10.0*  HCT 32.7* 30.8* 28.9* 30.4*  MCV 93.2 93.6 94.4 94.7  PLT 259 262 275 366   Basic Metabolic Panel:  Recent Labs Lab  12/13/16 1109 12/14/16 0457 12/15/16 0400 12/16/16 0903  NA 139 137 139 137  K 3.4* 3.4* 3.8 3.9  CL 103 105 106 104  CO2 21* 25 28 26   GLUCOSE 98 108* 97 94  BUN 19 18 20 18   CREATININE 0.68 0.59 0.59 0.45  CALCIUM 9.5 8.5* 8.2* 8.3*   GFR: Estimated Creatinine Clearance: 41.2 mL/min (by C-G formula based on SCr of 0.45 mg/dL). Liver Function Tests:  Recent Labs Lab 12/13/16 1109  AST 36  ALT 27  ALKPHOS 101  BILITOT 0.9  PROT 6.0*  ALBUMIN 2.9*   No results for input(s): LIPASE, AMYLASE in the last 168 hours. No results for input(s): AMMONIA in the last 168  hours. Coagulation Profile:  Recent Labs Lab 12/13/16 1109  INR 1.21   Cardiac Enzymes:  Recent Labs Lab 12/13/16 1109  TROPONINI 0.06*   BNP (last 3 results) No results for input(s): PROBNP in the last 8760 hours. HbA1C: No results for input(s): HGBA1C in the last 72 hours. CBG: No results for input(s): GLUCAP in the last 168 hours. Lipid Profile: No results for input(s): CHOL, HDL, LDLCALC, TRIG, CHOLHDL, LDLDIRECT in the last 72 hours. Thyroid Function Tests: No results for input(s): TSH, T4TOTAL, FREET4, T3FREE, THYROIDAB in the last 72 hours. Anemia Panel: No results for input(s): VITAMINB12, FOLATE, FERRITIN, TIBC, IRON, RETICCTPCT in the last 72 hours. Urine analysis:    Component Value Date/Time   COLORURINE AMBER (A) 12/13/2016 1125   APPEARANCEUR HAZY (A) 12/13/2016 1125   LABSPEC 1.017 12/13/2016 1125   PHURINE 5.0 12/13/2016 1125   GLUCOSEU NEGATIVE 12/13/2016 1125   HGBUR NEGATIVE 12/13/2016 1125   BILIRUBINUR NEGATIVE 12/13/2016 1125   KETONESUR 20 (A) 12/13/2016 1125   PROTEINUR 100 (A) 12/13/2016 1125   UROBILINOGEN 0.2 08/19/2013 1624   NITRITE POSITIVE (A) 12/13/2016 1125   LEUKOCYTESUR LARGE (A) 12/13/2016 1125   Sepsis Labs: @LABRCNTIP (procalcitonin:4,lacticidven:4)  ) Recent Results (from the past 240 hour(s))  Culture, blood (Routine x 2)     Status: None (Preliminary result)   Collection Time: 12/13/16 11:09 AM  Result Value Ref Range Status   Specimen Description BLOOD RIGHT HAND  Final   Special Requests   Final    BOTTLES DRAWN AEROBIC AND ANAEROBIC Blood Culture adequate volume   Culture NO GROWTH 4 DAYS  Final   Report Status PENDING  Incomplete  Culture, blood (Routine x 2)     Status: None (Preliminary result)   Collection Time: 12/13/16 11:09 AM  Result Value Ref Range Status   Specimen Description BLOOD RIGHT HAND  Final   Special Requests   Final    BOTTLES DRAWN AEROBIC ONLY Blood Culture adequate volume   Culture NO  GROWTH 4 DAYS  Final   Report Status PENDING  Incomplete  Culture, Urine     Status: None   Collection Time: 12/14/16  4:44 PM  Result Value Ref Range Status   Specimen Description URINE, CLEAN CATCH  Final   Special Requests NONE  Final   Culture   Final    NO GROWTH Performed at [facility], [place] [address], [place], [place] [address]    Report Status 12/15/2016 FINAL  Final  Culture, sputum-assessment     Status: None   Collection Time: 12/16/16  4:40 PM  Result Value Ref Range Status   Specimen Description EXPECTORATED SPUTUM  Final   Special Requests NONE  Final   Sputum evaluation THIS SPECIMEN IS ACCEPTABLE FOR SPUTUM CULTURE  Final   Report Status 12/16/2016 FINAL  Final  Culture, respiratory (NON-Expectorated)     Status: None (Preliminary result)   Collection Time: 12/16/16  4:40 PM  Result Value Ref Range Status   Specimen Description EXPECTORATED SPUTUM  Final   Special Requests NONE Reflexed from [id number]  Final   Gram Stain   Final    ABUNDANT WBC PRESENT, PREDOMINANTLY PMN RARE SQUAMOUS EPITHELIAL CELLS PRESENT FEW GRAM POSITIVE COCCI IN CLUSTERS RARE BUDDING YEAST SEEN    Culture   Final    CULTURE REINCUBATED FOR BETTER GROWTH Performed at [facility], [place] [address], [place], [place] [address]    Report Status PENDING  Incomplete         Radiology Studies: No results found.      Scheduled Meds: . aspirin EC  81 mg Oral Once per day on Mon Thu  . azithromycin  500 mg Oral Q24H  . cefpodoxime  200 mg Oral Q12H  . levothyroxine  50 mcg Oral q morning - 10a  . lisinopril  20 mg Oral Daily  . olopatadine  1 drop Both Eyes BID  . psyllium  1 packet Oral Daily  . rOPINIRole  1 mg Oral QHS  . saccharomyces boulardii  250 mg Oral BID  . sertraline  25 mg Oral q morning - 10a  . tiotropium  18 mcg Inhalation Daily   Continuous Infusions:    LOS: 4 days    Time spent: 27min    Domenic Polite, MD [facility] Page via  [email or web address], password [email or web address]  If 7PM-7AM, please contact night-coverage [email or web address] Password [email or web address] 12/17/2016, 1:08 PM

## 2016-12-18 LAB — CULTURE, BLOOD (ROUTINE X 2)
Culture: NO GROWTH
Culture: NO GROWTH
SPECIAL REQUESTS: ADEQUATE
Special Requests: ADEQUATE

## 2016-12-18 NOTE — Progress Notes (Signed)
PROGRESS NOTE    Makayla Fisher  PYK:[id number] DOB: 14-Aug-1922 DOA: 12/13/2016 PCP: [name], DO  Brief Narrative: Makayla Fisher is a 83 y.o. female with medical history significant of hypertension and glaucoma who presented from assisted living facility with several days of dysuria, shortness of breath and nonproductive cough. She started on a brief course of Ceftin, then started on Macrobid which she only received about 1 day for feeling so bad, that she was sent over to the emergency room found by chest x-ray to have a developing left lower lobe pneumonia and also UTI. Lactic acid level normal although pro-calcitonin level elevated at 7.62 and white count elevated at 18   Assessment & Plan:   #1 Community-acquired pneumonia -Failed outpatient antibiotic therapy -admitted with hypoxia and resp distress -Treated with IV ceftriaxone and azithromycin for 5days and then changed to PO Vantin  -Blood cultures negative -Sputum culture -likely oral flora -Ambulate, out of bed, physical therapy evaluation completed, SNF recommended however patient and nephew would like her to go back to her assisted living with home health physical therapy services -SLP evaluation completed-mild aspiration risk -pt and nephew wanting SNF now, after declining this 3days ago -CSW reconsulted for ST Rehab  #2. Urinary tract infection -Urinalysis impressive with symptoms of dysuria and burning  -unfortunately urine cultures were not sent from the emergency room on admission before antibiotics were started, this was sent 8/22 although of limited value -urine Cx negative  #3. Non-Hodgkin's lymphoma -Stable  #4. Conjunctival irritation -Resolved  #5 .Hypertension -Stable, continue lisinopril  #6. Colostomy in place [facility]) -Secondary to diverticulitis  DVT prophylaxis:  lovenox Code Status: Full COde Family Communication: neice at bedside Disposition Plan: SNF when bed available  Consultants:     slp  Antimicrobials:   Ceftriaxone and azithromycin from 8/21-8/25  Vantin 8/25->   Subjective: -breathing improving, some cough, anxious abt going back to ALF, now wanting rehab  Objective: Vitals:   12/17/16 1408 12/17/16 2014 12/18/16 0352 12/18/16 1141  BP: (!) 148/41 (!) 153/64 (!) 150/42   Pulse: 73 73 82   Resp: 20 20 (!) 21   Temp: 97.7 F (36.5 C) 98.1 F (36.7 C) 98.1 F (36.7 C)   TempSrc: Oral Oral Oral   SpO2: 96% 92% 94% 93%  Weight:      Height:        Intake/Output Summary (Last 24 hours) at 12/18/16 1211 Last data filed at 12/18/16 0700  Gross per 24 hour  Intake              120 ml  Output              550 ml  Net             -430 ml   Filed Weights   12/13/16 1237 12/13/16 1420  Weight: 73.9 kg (163 lb) 74.2 kg (163 lb 9.3 oz)    Examination:  Gen: Awake, Alert, Oriented X 3, frail, elderly HEENT: PERRLA, Neck supple, no JVD Lungs: few basilar ronchi CVS: RRR,No Gallops,Rubs or new Murmurs Abd: soft, Non tender, non distended, BS present Extremities: No Cyanosis, Clubbing or edema Skin: no new rashes   Data Reviewed:   CBC:  Recent Labs Lab 12/13/16 1109 12/14/16 0457 12/15/16 0400 12/16/16 0903  WBC 18.7* 20.1* 13.1* 10.0  NEUTROABS 16.8*  --   --   --   HGB 11.0* 10.2* 9.6* 10.0*  HCT 32.7* 30.8* 28.9* 30.4*  MCV 93.2 93.6 94.4 94.7  PLT 259 262 275 673   Basic Metabolic Panel:  Recent Labs Lab 12/13/16 1109 12/14/16 0457 12/15/16 0400 12/16/16 0903  NA 139 137 139 137  K 3.4* 3.4* 3.8 3.9  CL 103 105 106 104  CO2 21* 25 28 26   GLUCOSE 98 108* 97 94  BUN 19 18 20 18   CREATININE 0.68 0.59 0.59 0.45  CALCIUM 9.5 8.5* 8.2* 8.3*   GFR: Estimated Creatinine Clearance: 41.2 mL/min (by C-G formula based on SCr of 0.45 mg/dL). Liver Function Tests:  Recent Labs Lab 12/13/16 1109  AST 36  ALT 27  ALKPHOS 101  BILITOT 0.9  PROT 6.0*  ALBUMIN 2.9*   No results for input(s): LIPASE, AMYLASE in the last 168  hours. No results for input(s): AMMONIA in the last 168 hours. Coagulation Profile:  Recent Labs Lab 12/13/16 1109  INR 1.21   Cardiac Enzymes:  Recent Labs Lab 12/13/16 1109  TROPONINI 0.06*   BNP (last 3 results) No results for input(s): PROBNP in the last 8760 hours. HbA1C: No results for input(s): HGBA1C in the last 72 hours. CBG: No results for input(s): GLUCAP in the last 168 hours. Lipid Profile: No results for input(s): CHOL, HDL, LDLCALC, TRIG, CHOLHDL, LDLDIRECT in the last 72 hours. Thyroid Function Tests: No results for input(s): TSH, T4TOTAL, FREET4, T3FREE, THYROIDAB in the last 72 hours. Anemia Panel: No results for input(s): VITAMINB12, FOLATE, FERRITIN, TIBC, IRON, RETICCTPCT in the last 72 hours. Urine analysis:    Component Value Date/Time   COLORURINE AMBER (A) 12/13/2016 1125   APPEARANCEUR HAZY (A) 12/13/2016 1125   LABSPEC 1.017 12/13/2016 1125   PHURINE 5.0 12/13/2016 1125   GLUCOSEU NEGATIVE 12/13/2016 1125   HGBUR NEGATIVE 12/13/2016 1125   BILIRUBINUR NEGATIVE 12/13/2016 1125   KETONESUR 20 (A) 12/13/2016 1125   PROTEINUR 100 (A) 12/13/2016 1125   UROBILINOGEN 0.2 08/19/2013 1624   NITRITE POSITIVE (A) 12/13/2016 1125   LEUKOCYTESUR LARGE (A) 12/13/2016 1125   Sepsis Labs: @LABRCNTIP (procalcitonin:4,lacticidven:4)  ) Recent Results (from the past 240 hour(s))  Culture, blood (Routine x 2)     Status: None   Collection Time: 12/13/16 11:09 AM  Result Value Ref Range Status   Specimen Description BLOOD RIGHT HAND  Final   Special Requests   Final    BOTTLES DRAWN AEROBIC AND ANAEROBIC Blood Culture adequate volume   Culture   Final    NO GROWTH 5 DAYS Performed at [facility], [place] [address] [place], [place] [address]    Report Status 12/18/2016 FINAL  Final  Culture, blood (Routine x 2)     Status: None   Collection Time: 12/13/16 11:09 AM  Result Value Ref Range Status   Specimen Description BLOOD RIGHT HAND  Final    Special Requests   Final    BOTTLES DRAWN AEROBIC ONLY Blood Culture adequate volume   Culture   Final    NO GROWTH 5 DAYS Performed at [facility], [place] [address], [place], [place] [address]    Report Status 12/18/2016 FINAL  Final  Culture, Urine     Status: None   Collection Time: 12/14/16  4:44 PM  Result Value Ref Range Status   Specimen Description URINE, CLEAN CATCH  Final   Special Requests NONE  Final   Culture   Final    NO GROWTH Performed at [facility], [place] [address], [place], [place] [address]    Report Status 12/15/2016 FINAL  Final  Culture, sputum-assessment     Status: None   Collection Time: 12/16/16  4:40 PM  Result Value Ref Range Status   Specimen Description EXPECTORATED SPUTUM  Final   Special Requests NONE  Final   Sputum evaluation THIS SPECIMEN IS ACCEPTABLE FOR SPUTUM CULTURE  Final   Report Status 12/16/2016 FINAL  Final  Culture, respiratory (NON-Expectorated)     Status: None (Preliminary result)   Collection Time: 12/16/16  4:40 PM  Result Value Ref Range Status   Specimen Description EXPECTORATED SPUTUM  Final   Special Requests NONE Reflexed from [id number]  Final   Gram Stain   Final    ABUNDANT WBC PRESENT, PREDOMINANTLY PMN RARE SQUAMOUS EPITHELIAL CELLS PRESENT FEW GRAM POSITIVE COCCI IN CLUSTERS RARE BUDDING YEAST SEEN    Culture   Final    MODERATE Consistent with normal respiratory flora. Performed at [facility], [place] [address] [place],  [address]    Report Status PENDING  Incomplete         Radiology Studies: No results found.      Scheduled Meds: . aspirin EC  81 mg Oral Once per day on Mon Thu  . azithromycin  500 mg Oral Q24H  . cefpodoxime  200 mg Oral Q12H  . levothyroxine  50 mcg Oral q morning - 10a  . lisinopril  20 mg Oral Daily  . olopatadine  1 drop Both Eyes BID  . psyllium  1 packet Oral Daily  . rOPINIRole  1 mg Oral QHS  . saccharomyces boulardii  250 mg Oral BID  .  sertraline  25 mg Oral q morning - 10a  . tiotropium  18 mcg Inhalation Daily   Continuous Infusions:    LOS: 5 days    Time spent: 34min    Domenic Polite, MD [facility] Page via [email or web address], password [email or web address]  If 7PM-7AM, please contact night-coverage [email or web address] Password [email or web address] 12/18/2016, 12:11 PM

## 2016-12-18 NOTE — NC FL2 (Signed)
[place] LEVEL OF CARE SCREENING TOOL     IDENTIFICATION  Patient Name: Makayla Fisher Birthdate: [birth year] Sex: female Admission Date (Current Location): 12/13/2016  [facility] [place] Number:  Herbalist and Address:  [facility],  [place], [place]      Provider Number: [id number]  Attending Physician Name and Address:  Domenic Polite, MD  Relative Name and Phone Number:       Current Level of Care: Hospital Recommended Level of Care: [place] Prior Approval Number:    Date Approved/Denied:   PASRR Number: [id number]  Discharge Plan: SNF    Current Diagnoses: Patient Active Problem List   Diagnosis Date Noted  . CAP (community acquired pneumonia) 12/13/2016  . Acute lower UTI 12/13/2016  . Glaucoma 12/13/2016  . Colostomy in place [facility]) 12/13/2016  . Vitamin D deficiency 10/20/2016  . Follicular lymphoma grade IIIa of intra-abdominal lymph nodes ([place]) 04/22/2016  . Iron deficiency anemia due to chronic blood loss 08/07/2014  . HTN (hypertension) 07/15/2014  . Unilateral carotid artery disease ([place]) 07/15/2014  . Bradycardia 07/15/2014  . 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 Preop cardiovascular exam 04/08/2014  . NHL (non-Hodgkin's lymphoma) ([place]) 10/18/2013  . 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    Orientation RESPIRATION BLADDER Height & Weight     Self, Time, Situation, Place  Normal Incontinent, External catheter Weight: 163 lb 9.3 oz (74.2 kg) Height:  5' (152.4 cm)  BEHAVIORAL SYMPTOMS/MOOD NEUROLOGICAL BOWEL NUTRITION STATUS      Colostomy    AMBULATORY STATUS COMMUNICATION OF NEEDS Skin   Limited Assist Verbally Normal                       Personal Care Assistance Level of Assistance  Bathing, Dressing Bathing Assistance: Limited assistance   Dressing Assistance: Limited assistance     Functional Limitations Info              SPECIAL CARE FACTORS FREQUENCY  PT (By licensed PT), OT (By licensed OT)     PT Frequency: 5x/wk OT Frequency: 5x/wk            Contractures      Additional Factors Info  Code Status, Allergies, Psychotropic Code Status Info: Full Allergies Info: Epipen Epinephrine, Heparin, Levaquin Levofloxacin In D5w, Chlorhexidine Gluconate, Sulfa Antibiotics Psychotropic Info: Zoloft 25mg          Current Medications (12/18/2016):  This is the current hospital active medication list Current Facility-Administered Medications  Medication Dose Route Frequency Provider Last Rate Last Dose  . acetaminophen (TYLENOL) tablet 650 mg  650 mg Oral Q6H PRN Schorr, Rhetta Mura, NP   650 mg at 12/17/16 2138  . aspirin EC tablet 81 mg  81 mg Oral Once per day on Mon Thu Krishnan, Sendil K, MD   81 mg at 12/15/16 1011  . azithromycin (ZITHROMAX) tablet 500 mg  500 mg Oral Q24H Dara Hoyer, RPH   500 mg at 12/17/16 1515  . cefpodoxime Bennie Pierini) tablet 200 mg  200 mg Oral Q12H Domenic Polite, MD   200 mg at 12/17/16 2138  . levothyroxine (SYNTHROID, LEVOTHROID) tablet 50 mcg  50 mcg Oral q morning - 10a Annita Brod, MD   50 mcg at 12/17/16 1243  . lisinopril (PRINIVIL,ZESTRIL) tablet 20 mg  20 mg Oral Daily Annita Brod, MD   20 mg at 12/17/16 1243  . LORazepam (  ATIVAN) tablet 0.5 mg  0.5 mg Oral BID PRN Domenic Polite, MD   0.5 mg at 12/17/16 2138  . naphazoline-glycerin (CLEAR EYES) ophth solution 1-2 drop  1-2 drop Both Eyes QID PRN Annita Brod, MD      . olopatadine (PATANOL) 0.1 % ophthalmic solution 1 drop  1 drop Both Eyes BID Annita Brod, MD   1 drop at 12/17/16 2139  . psyllium (HYDROCIL/METAMUCIL) packet 1 packet  1 packet Oral Daily Domenic Polite, MD   1 packet at 12/17/16 1243  . rOPINIRole (REQUIP) tablet 1 mg  1 mg Oral QHS Annita Brod, MD   1 mg at 12/17/16 2139  . saccharomyces boulardii (FLORASTOR) capsule 250 mg  250 mg Oral BID Annita Brod, MD   250 mg at 12/17/16 2138  . sertraline (ZOLOFT) tablet 25 mg  25 mg Oral q morning - 10a Annita Brod, MD   25 mg at 12/17/16 1242  . sodium chloride flush (NS) 0.9 % injection 10-40 mL  10-40 mL Intracatheter PRN Annita Brod, MD   10 mL at 12/16/16 0902  . tiotropium (SPIRIVA) inhalation capsule 18 mcg  18 mcg Inhalation Daily Annita Brod, MD   18 mcg at 12/16/16 3016     Discharge Medications: Please see discharge summary for a list of discharge medications.  Relevant Imaging Results:  Relevant Lab Results:   Additional Information SS#: [id number]  Geralynn Ochs, LCSW

## 2016-12-18 NOTE — Progress Notes (Signed)
CSW met with patient, patient's nephew, and niece-in-law at bedside to discuss change of plans to pursue SNF placement at this time instead of return to ALF. Patient's nephew and niece are coming to terms with the fact that patient may need a higher level of care moving forward, and would ideally like to obtain placement at a facility that can also accommodate the patient for long term placement. CSW answered questions and discussed concerns with the patient and family.   CSW faxed out referral for SNF placement and gave bed offers and referral list to family. Patient's nephew and niece are interested in [facility], [place], or [place] as placement options, and will be going to look at facilities this afternoon. Patient's nephew and niece would like to obtain information about whether or not any facilities could accommodate patient as a long term resident after SNF is complete, if able. CSW to follow up with bed availability and determine long term availability for family.  Laveda Abbe, LCSW Clinical Social Worker [phone], Weekend Coverage

## 2016-12-19 DIAGNOSIS — J189 Pneumonia, unspecified organism: Principal | ICD-10-CM

## 2016-12-19 DIAGNOSIS — Z8572 Personal history of non-Hodgkin lymphomas: Secondary | ICD-10-CM

## 2016-12-19 LAB — CULTURE, RESPIRATORY W GRAM STAIN

## 2016-12-19 LAB — CULTURE, RESPIRATORY: CULTURE: NORMAL

## 2016-12-19 MED ORDER — LORAZEPAM 0.5 MG PO TABS: 0.5000 mg | ORAL_TABLET | Freq: Two times a day (BID) | ORAL | 0 refills | Status: DC | PRN

## 2016-12-19 MED ORDER — HEPARIN SOD (PORK) LOCK FLUSH 100 UNIT/ML IV SOLN: 500.0000 [IU] | INTRAVENOUS | Status: DC | PRN

## 2016-12-19 MED ORDER — HEPARIN SOD (PORK) LOCK FLUSH 100 UNIT/ML IV SOLN
500.0000 [IU] | INTRAVENOUS | Status: AC | PRN
  Administered 2016-12-19: 500 [IU]

## 2016-12-19 NOTE — Clinical Social Work Placement (Signed)
Pt discharging today to transfer to [facility]. Report Number [phone]  (pt from [facility] however due to recommendation for rehab, agreed to pursue SNF)  Pt will transfer via [place] completed medical necessity form and arranged transportation. Discussed with pt/family there may be cost associates- they agreed. Pt's son and niece at bedside and agreeable to plan for transfer/  All information provided to facility via the [place].   See below for placement details.   CLINICAL SOCIAL WORK PLACEMENT  NOTE  Date:  12/19/2016  Patient Details  Name: Makayla Fisher MRN: [id number] Date of Birth: 14-Feb-1923  Clinical Social Work is seeking post-discharge placement for this patient at the [place] level of care (*CSW will initial, date and re-position this form in  chart as items are completed):  Yes   Patient/family provided with [place] Work Department's list of facilities offering this level of care within the geographic area requested by the patient (or if unable, by the patient's family).      Patient/family informed of their freedom to choose among providers that offer the needed level of care, that participate in Medicare, Medicaid or managed care program needed by the patient, have an available bed and are willing to accept the patient.  Yes   Patient/family informed of [facility]'s ownership interest in [facility] and [facility], as well as of the fact that they are under no obligation to receive care at these facilities.  PASRR submitted to EDS on       PASRR number received on       Existing PASRR number confirmed on 12/18/16     FL2 transmitted to all facilities in geographic area requested by pt/family on 12/19/16     FL2 transmitted to all facilities within larger geographic area on       Patient informed that his/her managed care company has contracts with or will negotiate with certain facilities,  including the following:        Yes   Patient/family informed of bed offers received.  Patient chooses bed at [facility]     Physician recommends and patient chooses bed at [facility]    Patient to be transferred to [facility] on 12/19/16.  Patient to be transferred to facility by       Patient family notified on 12/19/16 of transfer.  Name of family member notified:  Philomena Doheny, son      PHYSICIAN       Additional Comment:    _______________________________________________ Nila Nephew, LCSW 12/19/2016, 11:31 AM

## 2016-12-19 NOTE — Discharge Summary (Signed)
Physician Discharge Summary  Makayla Fisher WCH:[id number] DOB: 1923/03/16 DOA: 12/13/2016  PCP: [name], DO  Admit date: 12/13/2016 Discharge date: 12/19/2016  Time spent: 35 minutes  Recommendations for Outpatient Follow-up:  1. PCP in 1 week 2. Oncology Dr.Ennever in 47month, please FU immunoglobulin levels   Discharge Diagnoses:    Acute hypoxic resp failure   Community acquired pneumonia   NHL (non-Hodgkin's lymphoma) ([place])   HTN (hypertension)   CAP (community acquired pneumonia)   Acute lower UTI   Glaucoma   Colostomy in place [facility])   Discharge Condition: stable  Diet recommendation: heart healthy  Filed Weights   12/13/16 1237 12/13/16 1420  Weight: 73.9 kg (163 lb) 74.2 kg (163 lb 9.3 oz)    History of present illness:  Makayla Fisher a 83 y.o.femalewith medical history significant of hypertension and glaucoma who presented from assisted living facility with several days of dysuria, shortness of breath and nonproductive cough. She started on a brief course of Ceftin, then started on Macrobid which she only received about 1 day for feeling so bad, that she was sent over to the emergency room found by chest x-ray to have a developing left lower lobe pneumonia  Hospital Course:  #1. Acute hypoxic resp failure due to Community-acquired pneumonia -Failed outpatient antibiotic therapy -admitted with hypoxia and resp distress -Treated with IV ceftriaxone and azithromycin for 5days and then changed to PO Vantin  -Blood cultures negative, Sputum culture consistent with oral flora -physical therapy evaluation completed, SNF recommended, patient initially declined this and then few days later changed her mind and hence will DC to SNF today -SLP evaluation completed-mild aspiration risk -she will complete 7days of antibiotics today, hence no additional Abx needed at discharge  #2. Bacteruria -Urinalysis impressive with symptoms of dysuria and burning   -unfortunately urine cultures were not sent from the emergency room on admission before antibiotics were started, this was sent 8/22 although of limited value -urine Cx negative -completed course with above Abx anyway  #3. Non-Hodgkin's lymphoma -Stable, seen by Dr.Ennever, he sent out Ig level which need FU  #4. Conjunctival irritation -Resolved  #5 .Hypertension -Stable, continue lisinopril  #6. Colostomy in place [facility] -Secondary to diverticulitis -stable  Discharge Exam: Vitals:   12/19/16 0637 12/19/16 0700  BP: (!) 146/52   Pulse: 68   Resp: 18   Temp: 97.9 F (36.6 C)   SpO2: 94% 93%    General: AAOx3 Cardiovascular: S1S2/RRR Respiratory: few basilar ronchi, much improved  Discharge Instructions   Discharge Instructions    Diet - low sodium heart healthy    Complete by:  As directed    Increase activity slowly    Complete by:  As directed      Current Discharge Medication List    START taking these medications   Details  LORazepam (ATIVAN) 0.5 MG tablet Take 1 tablet (0.5 mg total) by mouth 2 (two) times daily as needed for anxiety. Qty: 10 tablet, Refills: 0      CONTINUE these medications which have NOT CHANGED   Details  acetaminophen (TYLENOL) 500 MG tablet Take 500 mg by mouth 2 (two) times daily.    aspirin EC 81 MG tablet Take 81 mg by mouth 2 (two) times a week.     Calcium Carbonate-Vitamin D (CALCIUM 600+D) 600-400 MG-UNIT tablet Take 1 tablet by mouth daily.    Cranberry 425 MG CAPS Take 1 capsule by mouth daily.    levothyroxine (SYNTHROID, LEVOTHROID) 50 MCG tablet Take  50 mcg by mouth every morning.   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    !! Multiple Vitamin (MULTIVITAMIN WITH MINERALS) TABS tablet Take 1 tablet by mouth daily.    !! Multiple Vitamins-Minerals (RA  VISION-VITE PRESERVE PO) Take 1 tablet by mouth 2 (two) times daily.    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    rOPINIRole (REQUIP) 1 MG tablet Take 1 mg by mouth at bedtime.    saccharomyces boulardii (FLORASTOR) 250 MG capsule Take 250 mg by mouth 2 (two) times daily. x2 weeks    sertraline (ZOLOFT) 25 MG tablet Take 25 mg by mouth every morning.     SPIRIVA HANDIHALER 18 MCG inhalation capsule Place 18 mcg into inhaler and inhale as needed.   Associated Diagnoses: Grade 3 follicular lymphoma of lymph nodes of multiple regions ([place]); Iron deficiency anemia due to chronic blood loss; Osteoporosis due to aromatase inhibitor     !! - Potential duplicate medications found. Please discuss with provider.    STOP taking these medications     fluconazole (DIFLUCAN) 100 MG tablet      nitrofurantoin, macrocrystal-monohydrate, (MACROBID) 100 MG capsule      phenazopyridine (PYRIDIUM) 100 MG tablet        Allergies  Allergen Reactions  . Epipen [Epinephrine]     Heart palpitations  . Heparin     Unsure of side affects (maybe affected platelets per pt)  . Levaquin [Levofloxacin In D5w] Other (See Comments)    Weakness  . Chlorhexidine Gluconate Other (See Comments)    Pt declined use and suspects she may have an intolerance,   . Sulfa Antibiotics Rash   Follow-up Information    [name], DO. Schedule an appointment as soon as possible for a visit in 1 week(s).   Specialty:  Family Medicine Contact information: [place] [address] [phone]            The results of significant diagnostics from this hospitalization (including imaging, microbiology, ancillary and laboratory) are listed below for reference.     Significant Diagnostic Studies: Dg Chest 2 View  Result Date: 12/14/2016 CLINICAL DATA:  Shortness of breath.  Follow-up pneumonia. EXAM: CHEST  2 VIEW COMPARISON:  12/13/2016 FINDINGS: A right jugular Port-A-Cath terminates over the lower SVC. Sequelae of prior CABG are again identified. The cardiac silhouette is normal in size. Aortic atherosclerosis is noted. Patchy opacities are again seen throughout both lungs, overall similar to the prior study with at most minimal improvement in the right lung base and possible slight worsening in the left upper lobe. No sizable pleural effusion or pneumothorax is identified. A chronic midthoracic vertebral compression fracture is again noted. There surgical clips in the upper abdomen. IMPRESSION: At most minimal mixed interval changes of patchy bilateral lung opacities concerning for pneumonia. Electronically Signed   By: Logan Bores M.D.   On: 12/14/2016 13:21   Dg Chest 2 View  Result Date: 12/13/2016 CLINICAL DATA:  Increasing cough, shortness of breath, and chills for the past 2 days. History of non-Hodgkin's lymphoma, coronary artery disease with stent placement, former smoker. EXAM: CHEST  2 VIEW COMPARISON:  PA and lateral chest x-ray of Aug 25, 2015 FINDINGS: The lungs are reasonably well inflated. There patchy interstitial and alveolar opacities bilaterally. In addition confluent density in the retrocardiac region likely on the left is present. There is no pleural effusion or pneumothorax. The heart and pulmonary vascularity are normal the patient has undergone previous median sternotomy and internal mammary artery dissection. There is calcification in the wall of the aortic arch. The mediastinum is normal in width. A power port catheter tip projects over the midportion of the SVC. There is stable wedge compression of approximately T8. IMPRESSION: Patchy interstitial and alveolar opacities bilaterally but greatest in the left lower lobe worrisome for  pneumonia. No definite CHF. Thoracic aortic atherosclerosis. Electronically Signed   By: David  Martinique M.D.   On: 12/13/2016 12:08    Microbiology: Recent Results (from the past 240 hour(s))  Culture, blood (Routine x 2)     Status: None   Collection Time: 12/13/16 11:09 AM  Result Value Ref Range Status   Specimen Description BLOOD RIGHT HAND  Final   Special Requests   Final    BOTTLES DRAWN AEROBIC AND ANAEROBIC Blood Culture adequate volume   Culture   Final    NO GROWTH 5 DAYS Performed at [facility], [address] [place], [place] [address]    Report Status 12/18/2016 FINAL  Final  Culture, blood (Routine x 2)     Status: None   Collection Time: 12/13/16 11:09 AM  Result Value Ref Range Status   Specimen Description BLOOD RIGHT HAND  Final   Special Requests   Final    BOTTLES DRAWN AEROBIC ONLY Blood Culture adequate volume   Culture   Final    NO GROWTH 5 DAYS Performed at [facility], [place] [address] [place], [place] [address]    Report Status 12/18/2016 FINAL  Final  Culture, Urine     Status: None   Collection Time: 12/14/16  4:44 PM  Result Value Ref Range Status   Specimen Description URINE, CLEAN CATCH  Final   Special Requests NONE  Final   Culture   Final    NO GROWTH Performed at [facility], [place] [address] [place], [place] [address]    Report Status 12/15/2016 FINAL  Final  Culture, sputum-assessment     Status: None   Collection Time: 12/16/16  4:40 PM  Result Value Ref Range Status   Specimen Description EXPECTORATED SPUTUM  Final   Special Requests NONE  Final   Sputum evaluation THIS SPECIMEN IS ACCEPTABLE FOR SPUTUM CULTURE  Final   Report Status 12/16/2016 FINAL  Final  Culture, respiratory (NON-Expectorated)     Status: None (Preliminary result)   Collection Time: 12/16/16  4:40 PM  Result Value Ref Range Status   Specimen Description EXPECTORATED SPUTUM  Final   Special Requests NONE Reflexed from [id number]  Final   Gram  Stain   Final    ABUNDANT WBC PRESENT, PREDOMINANTLY PMN RARE SQUAMOUS EPITHELIAL CELLS PRESENT FEW GRAM POSITIVE COCCI IN CLUSTERS RARE BUDDING YEAST SEEN    Culture   Final    MODERATE Consistent with normal respiratory flora. Performed at [facility], [place] [address] [place], [place] [address]    Report Status PENDING  Incomplete     Labs: Basic Metabolic Panel:  Recent Labs Lab 12/13/16 1109 12/14/16 0457 12/15/16 0400 12/16/16 0903  NA 139 137 139 137  K 3.4* 3.4* 3.8 3.9  CL  103 105 106 104  CO2 21* 25 28 26   GLUCOSE 98 108* 97 94  BUN 19 18 20 18   CREATININE 0.68 0.59 0.59 0.45  CALCIUM 9.5 8.5* 8.2* 8.3*   Liver Function Tests:  Recent Labs Lab 12/13/16 1109  AST 36  ALT 27  ALKPHOS 101  BILITOT 0.9  PROT 6.0*  ALBUMIN 2.9*   No results for input(s): LIPASE, AMYLASE in the last 168 hours. No results for input(s): AMMONIA in the last 168 hours. CBC:  Recent Labs Lab 12/13/16 1109 12/14/16 0457 12/15/16 0400 12/16/16 0903  WBC 18.7* 20.1* 13.1* 10.0  NEUTROABS 16.8*  --   --   --   HGB 11.0* 10.2* 9.6* 10.0*  HCT 32.7* 30.8* 28.9* 30.4*  MCV 93.2 93.6 94.4 94.7  PLT 259 262 275 284   Cardiac Enzymes:  Recent Labs Lab 12/13/16 1109  TROPONINI 0.06*   BNP: BNP (last 3 results)  Recent Labs  12/13/16 1109  BNP 265.6*    ProBNP (last 3 results) No results for input(s): PROBNP in the last 8760 hours.  CBG: No results for input(s): GLUCAP in the last 168 hours.     SignedDomenic Polite MD.  [facility] 12/19/2016, 10:16 AM

## 2016-12-19 NOTE — Care Management Important Message (Signed)
Important Message  Patient Details  Name: Makayla Fisher MRN: [id number] Date of Birth: 09/20/1922   Medicare Important Message Given:  Yes    Kerin Salen 12/19/2016, 11:31 AMImportant Message  Patient Details  Name: Makayla Fisher MRN: [id number] Date of Birth: 12/05/1922   Medicare Important Message Given:  Yes    Kerin Salen 12/19/2016, 11:31 AM

## 2016-12-19 NOTE — Consult Note (Signed)
[place] Nurse ostomy consult note Stoma type/location: LLQ colostomy. Patient reports leakages at ALF where she resides. Stomal assessment/size: 1 and 5/8 inch oval stoma, red, moist, slightly protruding.  Os is pointed downward. Deep crease at 9 o'clock, minor crease at 3 o'clock Peristomal assessment: See above Treatment options for stomal/peristomal skin: skin barrier ring, soft convex pouch, belt. Output liquid light brown stool Ostomy pouching: 1pc. Soft convex pouch [place] [phone] with skin barrier ring plus belt Education provided: Patient and niece taught the benefit and purpose of soft convexity, belt taught and applied. Patient may require a higher level of ALF care one which might provide her with a nighttime check for pouch emptying so that it does not over-fill and leak. This is discussed, but she is somewhat reluctant as this would increase her monthly;y bill at [facility]. Enrolled patient in [facility] program: No. Patient is established with a supplier.  [place] nursing team will not follow post discharge, but will remain available to this patient, the nursing and medical teams while in house.  Please re-consult if needed. Thanks, Maudie Flakes, MSN, RN, [place], Arther Abbott  Pager# [phone]

## 2016-12-19 NOTE — Consult Note (Signed)
Referral MD  Reason for Referral: Community-acquired pneumonia; history of diffuse large cell non-Hodgkin's lymphoma   Chief Complaint  Patient presents with  . Cough  : I have in the hospital for several days.  HPI: Makayla Fisher is a 83yo white female with a h/o follicular mixed NHL.  Had chemotherapy with R_CHOP. She completed chemotherapy in 2015. She then had maintenance Rituxan that was completed in June 2018.  She's been doing quite well. She is at a nursing home.  She developed a cough. Headache she had a temperature. She had to be admitted because of pneumonia.  She's been on IV antibiotics. She's getting some physical therapy.  Her labs on Friday showed a white count 10,000. Hemoglobin 10. Lyrica 2 84,000. Her sodium 137. Potassium 3.9.  She looks okay. She sees be breathing fairly well.  She has a colostomy. This is working well.  She's had no hemoptysis. There's been no purulent sputum that she's coughed up. She's been pretty weak. No aspirin culture that we have is from 12/16/2016. This showed normal respiratory flora.  I probably would check her immunoglobulin levels. Sometimes after chemotherapy for lymphoma, immunoglobulin levels go down and the risk of respiratory infections increases.  She seems be eating okay. There is no nausea or vomiting. Her colostomy is working okay.   Past Medical History:  Diagnosis Date  . GERD (gastroesophageal reflux disease)   . Heart attack ([place]) 2009  . History of blood transfusion 2009  . Iron deficiency anemia due to chronic blood loss 08/07/2014  . NHL (non-Hodgkin's lymphoma) ([place]) 10/18/2013   finished chemo dec 2015, maintenanance retuxin  :  Past Surgical History:  Procedure Laterality Date  . c section  1947, 1963  . CARDIAC SURGERY  2009  . CHOLECYSTECTOMY  1973  . COLON SURGERY  2014   Colostomy  . COLOSTOMY REVISION N/A 06/12/2014   Procedure: COLOSTOMY REVISION;  Surgeon: [name], MD;  Location: [facility];   Service: General;  Laterality: N/A;  . CORONARY ANGIOPLASTY WITH STENT PLACEMENT  2009  . CORONARY ARTERY BYPASS GRAFT  aug 2009    x3  . ESOPHAGOGASTRODUODENOSCOPY  2013  . EYE SURGERY Bilateral  10 years ago   lens replacments cataracts  . right chest pac  june 2015  :   Current Facility-Administered Medications:  .  acetaminophen (TYLENOL) tablet 650 mg, 650 mg, Oral, Q6H PRN, Schorr, Rhetta Mura, NP, 650 mg at 12/18/16 2213 .  aspirin EC tablet 81 mg, 81 mg, Oral, Once per day on Mon Thu, Krishnan, Sendil K, MD, 81 mg at 12/15/16 1011 .  azithromycin (ZITHROMAX) tablet 500 mg, 500 mg, Oral, Q24H, Dara Hoyer, RPH, 500 mg at 12/18/16 1403 .  cefpodoxime (VANTIN) tablet 200 mg, 200 mg, Oral, Q12H, Domenic Polite, MD, 200 mg at 12/18/16 2213 .  levothyroxine (SYNTHROID, LEVOTHROID) tablet 50 mcg, 50 mcg, Oral, q morning - 10a, Annita Brod, MD, 50 mcg at 12/18/16 1117 .  lisinopril (PRINIVIL,ZESTRIL) tablet 20 mg, 20 mg, Oral, Daily, Annita Brod, MD, 20 mg at 12/18/16 1117 .  LORazepam (ATIVAN) tablet 0.5 mg, 0.5 mg, Oral, BID PRN, Domenic Polite, MD, 0.5 mg at 12/18/16 2214 .  naphazoline-glycerin (CLEAR EYES) ophth solution 1-2 drop, 1-2 drop, Both Eyes, QID PRN, Annita Brod, MD .  olopatadine (PATANOL) 0.1 % ophthalmic solution 1 drop, 1 drop, Both Eyes, BID, Annita Brod, MD, 1 drop at 12/18/16 2214 .  psyllium (HYDROCIL/METAMUCIL) packet 1 packet, 1  packet, Oral, Daily, Domenic Polite, MD, 1 packet at 12/18/16 1117 .  rOPINIRole (REQUIP) tablet 1 mg, 1 mg, Oral, QHS, Annita Brod, MD, 1 mg at 12/18/16 2213 .  saccharomyces boulardii (FLORASTOR) capsule 250 mg, 250 mg, Oral, BID, Gevena Barre K, MD, 250 mg at 12/18/16 2213 .  sertraline (ZOLOFT) tablet 25 mg, 25 mg, Oral, q morning - 10a, Annita Brod, MD, 25 mg at 12/18/16 1116 .  sodium chloride flush (NS) 0.9 % injection 10-40 mL, 10-40 mL, Intracatheter, PRN, Annita Brod, MD,  10 mL at 12/16/16 0902 .  tiotropium (SPIRIVA) inhalation capsule 18 mcg, 18 mcg, Inhalation, Daily, Annita Brod, MD, 18 mcg at 12/18/16 1141:  . aspirin EC  81 mg Oral Once per day on Mon Thu  . azithromycin  500 mg Oral Q24H  . cefpodoxime  200 mg Oral Q12H  . levothyroxine  50 mcg Oral q morning - 10a  . lisinopril  20 mg Oral Daily  . olopatadine  1 drop Both Eyes BID  . psyllium  1 packet Oral Daily  . rOPINIRole  1 mg Oral QHS  . saccharomyces boulardii  250 mg Oral BID  . sertraline  25 mg Oral q morning - 10a  . tiotropium  18 mcg Inhalation Daily  :  Allergies  Allergen Reactions  . Epipen [Epinephrine]     Heart palpitations  . Heparin     Unsure of side affects (maybe affected platelets per pt)  . Levaquin [Levofloxacin In D5w] Other (See Comments)    Weakness  . Chlorhexidine Gluconate Other (See Comments)    Pt declined use and suspects she may have an intolerance,   . Sulfa Antibiotics Rash  :  Family History  Problem Relation Age of Onset  . Diverticulitis Mother   . Heart attack Mother   . Ulcers Father   :  Social History   Social History  . Marital status: Married    Spouse name: N/A  . Number of children: N/A  . Years of education: N/A   Occupational History  . Not on file.   Social History Main Topics  . Smoking status: Former Smoker    Packs/day: 1.00    Years: 25.00    Types: Cigarettes    Start date: 05/16/1948    Quit date: 05/17/1963  . Smokeless tobacco: Never Used     Comment: quit smoking 50 years ago  . Alcohol use No  . Drug use: No  . Sexual activity: Not on file   Other Topics Concern  . Not on file   Social History Narrative  . No narrative on file  :  Pertinent items are noted in HPI.  Exam: Patient Vitals for the past 24 hrs:  BP Temp Temp src Pulse Resp SpO2  12/19/16 0637 (!) 146/52 97.9 F (36.6 C) Oral 68 18 94 %  12/18/16 1325 (!) 128/44 98.4 F (36.9 C) Oral 74 19 92 %  12/18/16 1141 - - - - - 93  %    As above    Recent Labs  12/16/16 0903  WBC 10.0  HGB 10.0*  HCT 30.4*  PLT 284    Recent Labs  12/16/16 0903  NA 137  K 3.9  CL 104  CO2 26  GLUCOSE 94  BUN 18  CREATININE 0.45  CALCIUM 8.3*    Blood smear review:  None  Pathology: None     Assessment and Plan:  Ms. Allbright is  a 83 year old white female with a history of non-Hodgkin's lymphoma. She's been in remission for this. I don't think this is a problem. I don't think this has recurred.  Again, I would check her immunoglobulin levels. He will be interesting to see what this is. It might be that her immunoglobulins are on the lower side.  It is obvious that she is getting incredibly good care from all the staff upon 4W.  We will be more than happy to help out in any way that we can.  Lattie Haw, MD  Lurena Joiner 9:2

## 2016-12-19 NOTE — Progress Notes (Signed)
Occupational Therapy Treatment Patient Details Name: Makayla Fisher MRN: [id number] DOB: Mar 11, 1923 Today's Date: 12/19/2016    History of present illness Pt is a 83 y.o. female with PMH of hypertension and glaucoma who presents from assisted living facility with several days of dysuria, shortness of breath and nonproductive cough. Pt reports feeling as though she was getting weaker and weaker. Pt admitted to the ED where imaging confirmed pneumonia and a UTI. Pt admitted for CAP.   OT comments  Pt making progress towards goals, completing seated grooming ADLs with setup, LB ADLs with MinA, and stand pivot transfer at RW level with MinGuard assist. Pt reports fatigue/SOB, monitored O2 sats and remaining 91-93% on RA during session completion. Continue to recommend SNF to progress Pt's activity tolerance, safety and independence with ADLs and functional mobility. Will continue to follow acutely.    Follow Up Recommendations  SNF    Equipment Recommendations  3 in 1 bedside commode          Precautions / Restrictions Precautions Precautions: Fall Precaution Comments: LLQ colostomy, monitor sats Restrictions Weight Bearing Restrictions: No       Mobility Bed Mobility Overal bed mobility: Needs Assistance Bed Mobility: Supine to Sit     Supine to sit: Min guard;HOB elevated     General bed mobility comments: close guard for safety; Pt able to come upright into setting with increased time and scoot to EOB   Transfers Overall transfer level: Needs assistance Equipment used: Rolling walker (2 wheeled) Transfers: Sit to/from Stand Sit to Stand: Min assist Stand pivot transfers: Min guard       General transfer comment: assist to rise and steady.    Balance Overall balance assessment: Needs assistance   Sitting balance-Leahy Scale: Good     Standing balance support: Bilateral upper extremity supported Standing balance-Leahy Scale: Poor Standing balance comment: Pt  requires RW for bilat UE support                            ADL either performed or assessed with clinical judgement   ADL Overall ADL's : Needs assistance/impaired     Grooming: Oral care;Wash/dry face;Brushing hair;Set up;Sitting               Lower Body Dressing: Minimal assistance;Sit to/from stand Lower Body Dressing Details (indicate cue type and reason): MinA to don slippers sitting EOB                General ADL Comments: Pt transferred to recliner to complete seated grooming ADLs; Pt on RA this session and sats maintaining 91-93%. Educated on benefits of OOB to chair as Pt reporting she has been feeling weak lately.                        Cognition Arousal/Alertness: Awake/alert Behavior During Therapy: WFL for tasks assessed/performed Overall Cognitive Status: Within Functional Limits for tasks assessed                                                            Pertinent Vitals/ Pain       Pain Assessment: No/denies pain  Frequency  Min 2X/week        Progress Toward Goals  OT Goals(current goals can now be found in the care plan section)  Progress towards OT goals: Progressing toward goals  Acute Rehab OT Goals Patient Stated Goal: none stated.  Pt doesn't think she can manage at ALF OT Goal Formulation: With patient Time For Goal Achievement: 12/22/16 Potential to Achieve Goals: Good  Plan Discharge plan remains appropriate                     AM-PAC PT "6 Clicks" Daily Activity     Outcome Measure   Help from another person eating meals?: None Help from another person taking care of personal grooming?: A Little Help from another person toileting, which includes using toliet, bedpan, or urinal?: A Lot Help from another person bathing (including washing, rinsing, drying)?: A Little Help from another person  to put on and taking off regular upper body clothing?: A Little Help from another person to put on and taking off regular lower body clothing?: A Little 6 Click Score: 18    End of Session Equipment Utilized During Treatment: Gait belt;Rolling walker  OT Visit Diagnosis: Unsteadiness on feet (R26.81);Muscle weakness (generalized) (M62.81)   Activity Tolerance Patient tolerated treatment well   Patient Left in chair;with call bell/phone within reach;with chair alarm set   Nurse Communication Mobility status;Other (comment) (NT, need for new pure wick )        Time: 5638-7564 OT Time Calculation (min): 38 min  Charges: OT General Charges $OT Visit: 1 Procedure OT Treatments $Self Care/Home Management : 23-37 mins  Makayla Fisher, OT Pager [phone] 12/19/2016    Raymondo Band 12/19/2016, 10:26 AM

## 2016-12-20 LAB — IGG, IGA, IGM
IgA: 30 mg/dL — ABNORMAL LOW (ref 64–422)
IgG (Immunoglobin G), Serum: 548 mg/dL — ABNORMAL LOW (ref 700–1600)
IgM (Immunoglobulin M), Srm: 17 mg/dL — ABNORMAL LOW (ref 26–217)

## 2017-01-10 ENCOUNTER — Other Ambulatory Visit: Payer: Self-pay | Admitting: Family Medicine

## 2017-01-10 DIAGNOSIS — Z1231 Encounter for screening mammogram for malignant neoplasm of breast: Secondary | ICD-10-CM

## 2017-01-12 ENCOUNTER — Other Ambulatory Visit: Payer: Medicare Other

## 2017-01-12 ENCOUNTER — Ambulatory Visit: Payer: Medicare Other

## 2017-01-12 ENCOUNTER — Ambulatory Visit: Payer: Medicare Other | Admitting: Hematology & Oncology

## 2017-01-31 ENCOUNTER — Other Ambulatory Visit (HOSPITAL_BASED_OUTPATIENT_CLINIC_OR_DEPARTMENT_OTHER): Payer: Medicare Other

## 2017-01-31 ENCOUNTER — Ambulatory Visit: Payer: Medicare Other

## 2017-01-31 ENCOUNTER — Ambulatory Visit (HOSPITAL_BASED_OUTPATIENT_CLINIC_OR_DEPARTMENT_OTHER): Payer: Medicare Other | Admitting: Hematology & Oncology

## 2017-01-31 VITALS — BP 190/60 | HR 62 | Temp 97.5°F | Resp 18 | Wt 165.0 lb

## 2017-01-31 DIAGNOSIS — C8233 Follicular lymphoma grade IIIa, intra-abdominal lymph nodes: Secondary | ICD-10-CM | POA: Diagnosis present

## 2017-01-31 DIAGNOSIS — D500 Iron deficiency anemia secondary to blood loss (chronic): Secondary | ICD-10-CM

## 2017-01-31 DIAGNOSIS — E559 Vitamin D deficiency, unspecified: Secondary | ICD-10-CM

## 2017-01-31 LAB — CMP (CANCER CENTER ONLY)
ALT(SGPT): 14 U/L (ref 10–47)
AST: 26 U/L (ref 11–38)
Albumin: 3.6 g/dL (ref 3.3–5.5)
Alkaline Phosphatase: 57 U/L (ref 26–84)
BUN: 10 mg/dL (ref 7–22)
CALCIUM: 9.7 mg/dL (ref 8.0–10.3)
CHLORIDE: 106 meq/L (ref 98–108)
CO2: 27 meq/L (ref 18–33)
Creat: 0.9 mg/dl (ref 0.6–1.2)
GLUCOSE: 94 mg/dL (ref 73–118)
POTASSIUM: 3.7 meq/L (ref 3.3–4.7)
Sodium: 143 mEq/L (ref 128–145)
Total Bilirubin: 0.5 mg/dl (ref 0.20–1.60)
Total Protein: 6.2 g/dL — ABNORMAL LOW (ref 6.4–8.1)

## 2017-01-31 LAB — LACTATE DEHYDROGENASE: LDH: 185 U/L (ref 125–245)

## 2017-01-31 LAB — CBC WITH DIFFERENTIAL (CANCER CENTER ONLY)
BASO#: 0 10*3/uL (ref 0.0–0.2)
BASO%: 0.7 % (ref 0.0–2.0)
EOS ABS: 0.2 10*3/uL (ref 0.0–0.5)
EOS%: 4.2 % (ref 0.0–7.0)
HCT: 37.8 % (ref 34.8–46.6)
HEMOGLOBIN: 12.5 g/dL (ref 11.6–15.9)
LYMPH#: 1.5 10*3/uL (ref 0.9–3.3)
LYMPH%: 27.8 % (ref 14.0–48.0)
MCH: 32.3 pg (ref 26.0–34.0)
MCHC: 33.1 g/dL (ref 32.0–36.0)
MCV: 98 fL (ref 81–101)
MONO#: 0.7 10*3/uL (ref 0.1–0.9)
MONO%: 13.6 % — AB (ref 0.0–13.0)
NEUT#: 2.9 10*3/uL (ref 1.5–6.5)
NEUT%: 53.7 % (ref 39.6–80.0)
PLATELETS: 153 10*3/uL (ref 145–400)
RBC: 3.87 10*6/uL (ref 3.70–5.32)
RDW: 14.6 % (ref 11.1–15.7)
WBC: 5.4 10*3/uL (ref 3.9–10.0)

## 2017-01-31 LAB — IRON AND TIBC
%SAT: 23 % (ref 21–57)
IRON: 58 ug/dL (ref 41–142)
TIBC: 249 ug/dL (ref 236–444)
UIBC: 191 ug/dL (ref 120–384)

## 2017-01-31 LAB — FERRITIN: Ferritin: 107 ng/ml (ref 9–269)

## 2017-01-31 NOTE — Progress Notes (Signed)
Hematology and Oncology Follow Up Visit  Makayla Fisher [id number] [birth year] 83 y.o. 01/31/2017   Principle Diagnosis:  Follicular large cell non-Hodgkin's lymphoma Iron deficiency anemia secondary to malabsorption  Current Therapy:   Status post cycle #8 of maintenance Rituxan - complete in June 2018 IV iron-Feraheme as indicated     Interim History:  [name] is back for followup. She is doing well. She had a very quiet summer. She is doing some physical therapy which is helping.  She's had no cough or shortness of breath. She's had no bleeding or bruising.  Her colostomy is in the left lower quadrant. It has had no changes. She's had no bleeding into the colostomy.  Her appetite has been quite good. She's had no nausea or vomiting.  There's been no leg swelling. She's had no rashes.  We saw her back in June, her iron studies look good. Her ferritin was 123 with an iron saturation of 33%.  Hopefully, she will be able to spend the holidays with some of her family.   Overall, her performance status is ECOG 1-2.  Medications:  Current Outpatient Prescriptions:  .  acetaminophen (TYLENOL) 500 MG tablet, Take 500 mg by mouth 2 (two) times daily., Disp: , Rfl:  .  aspirin EC 81 MG tablet, Take 81 mg by mouth 2 (two) times a week. , Disp: , Rfl:  .  Calcium Carbonate-Vitamin D (CALCIUM 600+D) 600-400 MG-UNIT tablet, Take 1 tablet by mouth daily., Disp: , Rfl:  .  Cranberry 425 MG CAPS, Take 1 capsule by mouth daily., Disp: , Rfl:  .  levothyroxine (SYNTHROID, LEVOTHROID) 50 MCG tablet, Take 50 mcg by mouth every morning., Disp: , Rfl:  .  lisinopril (PRINIVIL,ZESTRIL) 20 MG tablet, Take 20 mg by mouth daily., Disp: , Rfl:  .  LORazepam (ATIVAN) 0.5 MG tablet, Take 1 tablet (0.5 mg total) by mouth 2 (two) times daily as needed for anxiety., Disp: 10 tablet, Rfl: 0 .  Multiple Vitamin (MULTIVITAMIN WITH MINERALS) TABS tablet, Take 1 tablet by mouth daily., Disp: , Rfl:  .   Multiple Vitamins-Minerals (RA VISION-VITE PRESERVE PO), Take 1 tablet by mouth 2 (two) times daily. , Disp: , Rfl:  .  olopatadine (PATANOL) 0.1 % ophthalmic solution, Place 1 drop into both eyes 2 (two) times daily., Disp: , Rfl:  .  rOPINIRole (REQUIP) 1 MG tablet, Take 1 mg by mouth at bedtime., Disp: , Rfl:  .  saccharomyces boulardii (FLORASTOR) 250 MG capsule, Take 250 mg by mouth 2 (two) times daily. x2 weeks, Disp: , Rfl:  .  sertraline (ZOLOFT) 25 MG tablet, Take 25 mg by mouth every morning. , Disp: , Rfl:  .  SPIRIVA HANDIHALER 18 MCG inhalation capsule, Place 18 mcg into inhaler and inhale as needed., Disp: , Rfl:   Allergies:  Allergies  Allergen Reactions  . Epipen [Epinephrine]     Heart palpitations  . Heparin     Unsure of side affects (maybe affected platelets per pt)  . Levaquin [Levofloxacin In D5w] Other (See Comments)    Weakness  . Chlorhexidine Gluconate Other (See Comments)    Pt declined use and suspects she may have an intolerance,   . Sulfa Antibiotics Rash    Past Medical History, Surgical history, Social history, and Family History were reviewed and updated.  Review of Systems: As stated in the interim history  Physical Exam:  weight is 165 lb (74.8 kg). Her oral temperature is 97.5 F (36.4 C) (  abnormal). Her blood pressure is 190/60 (abnormal) and her pulse is 62. Her respiration is 18 and oxygen saturation is 96%.   I examined Makayla Fisher. There results of my exam are noted below with appropriate changes:   Elderly white female. Head and neck exam shows no ocular or oral lesions. There are no palpable cervical or supraclavicular lymph nodes. Lungs are clear bilaterally. Cardiac exam regular rate and rhythm with no murmurs rubs or bruits. Abdomen is soft. She has good bowel sounds. There is no fluid wave. She has no guarding or rebound tenderness. The ostomy is intact. The ostomy is in the left lower quadrant There is brown stool in the ostomy bag.  There is no palpable liver or spleen tip. Her laparotomy scars are well-healed.  Breast exam shows no bilateral breast masses. She has no bilateral axillary adenopathy. Back exam shows no tenderness over the spine ribs or hips. She does have some slight kyphosis. Extremities shows no clubbing cyanosis or edema.  neurological exam is nonfocal.. Skin exam shows no rashes, ecchymoses or petechia. Neurological exam is nonfocal.   Lab Results  Component Value Date   WBC 5.4 01/31/2017   HGB 12.5 01/31/2017   HCT 37.8 01/31/2017   MCV 98 01/31/2017   PLT 153 01/31/2017     Chemistry      Component Value Date/Time   NA 143 01/31/2017 0931   NA 141 05/08/2014 1041   K 3.7 01/31/2017 0931   K 4.5 05/08/2014 1041   CL 106 01/31/2017 0931   CO2 27 01/31/2017 0931   CO2 24 05/08/2014 1041   BUN 10 01/31/2017 0931   BUN 14.2 05/08/2014 1041   CREATININE 0.9 01/31/2017 0931   CREATININE 0.8 05/08/2014 1041      Component Value Date/Time   CALCIUM 9.7 01/31/2017 0931   CALCIUM 9.2 05/08/2014 1041   ALKPHOS 57 01/31/2017 0931   ALKPHOS 66 05/08/2014 1041   AST 26 01/31/2017 0931   AST 23 05/08/2014 1041   ALT 14 01/31/2017 0931   ALT 10 05/08/2014 1041   BILITOT 0.50 01/31/2017 0931   BILITOT 0.42 05/08/2014 1041      Impression and Plan: Makayla Fisher is 83 year old female. She has a follicular large cell lymphoma. I do not see any evidence of recurrent disease. I believe that she is at significant risk for occurrence. Her labs look really good.  So far, everything has looked really nice.  I do not see any need for scans.  We will go ahead and get her back in 3 more months. We will have her come back in 6 weeks just for a Port-A-Cath flush.   I would not think that she would need any iron. Her MCV is doing pretty well.  As always, we have very good fellowship. I go to church with her niece. Her niece always gives me an update on Sundays as to what is happening with Makayla Fisher  during the week.     [name], MD 10/9/201811:13 AM

## 2017-02-01 LAB — VITAMIN D 25 HYDROXY (VIT D DEFICIENCY, FRACTURES): Vitamin D, 25-Hydroxy: 27.6 ng/mL — ABNORMAL LOW (ref 30.0–100.0)

## 2017-02-17 ENCOUNTER — Ambulatory Visit
Admission: RE | Admit: 2017-02-17 | Discharge: 2017-02-17 | Disposition: A | Discharge status: Home / Self Care | Payer: Medicare Other | Source: Ambulatory Visit | Attending: Family Medicine | Admitting: Family Medicine

## 2017-02-17 DIAGNOSIS — Z1231 Encounter for screening mammogram for malignant neoplasm of breast: Secondary | ICD-10-CM

## 2017-03-15 ENCOUNTER — Other Ambulatory Visit: Payer: Self-pay

## 2017-03-15 ENCOUNTER — Emergency Department (HOSPITAL_COMMUNITY)
Admission: EM | Admit: 2017-03-15 | Discharge: 2017-03-15 | Disposition: A | Discharge status: Home / Self Care | Payer: Medicare Other | Attending: Emergency Medicine | Admitting: Emergency Medicine

## 2017-03-15 ENCOUNTER — Emergency Department (HOSPITAL_COMMUNITY): Payer: Medicare Other

## 2017-03-15 DIAGNOSIS — R112 Nausea with vomiting, unspecified: Secondary | ICD-10-CM | POA: Diagnosis not present

## 2017-03-15 DIAGNOSIS — Z933 Colostomy status: Secondary | ICD-10-CM | POA: Insufficient documentation

## 2017-03-15 DIAGNOSIS — I252 Old myocardial infarction: Secondary | ICD-10-CM | POA: Insufficient documentation

## 2017-03-15 DIAGNOSIS — R197 Diarrhea, unspecified: Secondary | ICD-10-CM | POA: Diagnosis not present

## 2017-03-15 DIAGNOSIS — Z79899 Other long term (current) drug therapy: Secondary | ICD-10-CM | POA: Diagnosis not present

## 2017-03-15 DIAGNOSIS — Z87891 Personal history of nicotine dependence: Secondary | ICD-10-CM | POA: Diagnosis not present

## 2017-03-15 LAB — GASTROINTESTINAL PANEL BY PCR, STOOL (REPLACES STOOL CULTURE)

## 2017-03-15 LAB — I-STAT TROPONIN, ED: TROPONIN I, POC: 0.01 ng/mL (ref 0.00–0.08)

## 2017-03-15 LAB — CBC WITH DIFFERENTIAL/PLATELET
Basophils Absolute: 0 10*3/uL (ref 0.0–0.1)
Basophils Relative: 0 %
EOS PCT: 0 %
Eosinophils Absolute: 0 10*3/uL (ref 0.0–0.7)
HCT: 39.4 % (ref 36.0–46.0)
HEMOGLOBIN: 12.8 g/dL (ref 12.0–15.0)
LYMPHS ABS: 1.3 10*3/uL (ref 0.7–4.0)
LYMPHS PCT: 20 %
MCH: 31.4 pg (ref 26.0–34.0)
MCHC: 32.5 g/dL (ref 30.0–36.0)
MCV: 96.6 fL (ref 78.0–100.0)
Monocytes Absolute: 1 10*3/uL (ref 0.1–1.0)
Monocytes Relative: 16 %
Neutro Abs: 4.1 10*3/uL (ref 1.7–7.7)
Neutrophils Relative %: 64 %
PLATELETS: 137 10*3/uL — AB (ref 150–400)
RBC: 4.08 MIL/uL (ref 3.87–5.11)
RDW: 14.8 % (ref 11.5–15.5)
WBC: 6.4 10*3/uL (ref 4.0–10.5)

## 2017-03-15 LAB — URINALYSIS, ROUTINE W REFLEX MICROSCOPIC
Bilirubin Urine: NEGATIVE
Glucose, UA: NEGATIVE mg/dL
HGB URINE DIPSTICK: NEGATIVE
Ketones, ur: NEGATIVE mg/dL
NITRITE: NEGATIVE
Protein, ur: NEGATIVE mg/dL
Specific Gravity, Urine: 1.024 (ref 1.005–1.030)
pH: 5 (ref 5.0–8.0)

## 2017-03-15 LAB — COMPREHENSIVE METABOLIC PANEL
ALBUMIN: 3.9 g/dL (ref 3.5–5.0)
ALT: 11 U/L — ABNORMAL LOW (ref 14–54)
ANION GAP: 8 (ref 5–15)
AST: 28 U/L (ref 15–41)
Alkaline Phosphatase: 64 U/L (ref 38–126)
BUN: 14 mg/dL (ref 6–20)
CO2: 22 mmol/L (ref 22–32)
Calcium: 8.9 mg/dL (ref 8.9–10.3)
Chloride: 108 mmol/L (ref 101–111)
Creatinine, Ser: 0.86 mg/dL (ref 0.44–1.00)
GFR calc Af Amer: >60 mL/min (ref >60)
GFR calc non Af Amer: 57 mL/min — ABNORMAL LOW (ref >60)
GLUCOSE: 107 mg/dL — AB (ref 65–99)
POTASSIUM: 3.7 mmol/L (ref 3.5–5.1)
SODIUM: 138 mmol/L (ref 135–145)
Total Bilirubin: 0.5 mg/dL (ref 0.3–1.2)
Total Protein: 6.4 g/dL — ABNORMAL LOW (ref 6.5–8.1)

## 2017-03-15 LAB — LIPASE, BLOOD: Lipase: 29 U/L (ref 11–51)

## 2017-03-15 LAB — C DIFFICILE QUICK SCREEN W PCR REFLEX
C DIFFICILE (CDIFF) TOXIN: NEGATIVE
C DIFFICLE (CDIFF) ANTIGEN: NEGATIVE
C Diff interpretation: NOT DETECTED

## 2017-03-15 MED ORDER — SODIUM CHLORIDE 0.9 % IV BOLUS (SEPSIS)
1000.0000 mL | Freq: Once | INTRAVENOUS | Status: AC
  Administered 2017-03-15: 1000 mL via INTRAVENOUS

## 2017-03-15 MED ORDER — IOPAMIDOL (ISOVUE-300) INJECTION 61%
100.0000 mL | Freq: Once | INTRAVENOUS | Status: AC | PRN
  Administered 2017-03-15: 100 mL via INTRAVENOUS

## 2017-03-15 MED ORDER — LOPERAMIDE HCL 2 MG PO CAPS: 2.0000 mg | ORAL_CAPSULE | ORAL | 0 refills | Status: DC | PRN

## 2017-03-15 MED ORDER — LIDOCAINE-PRILOCAINE 2.5-2.5 % EX CREA
TOPICAL_CREAM | Freq: Once | CUTANEOUS | Status: AC
  Administered 2017-03-15: 06:00:00 via TOPICAL
  Filled 2017-03-15: qty 5

## 2017-03-15 MED ORDER — ONDANSETRON HCL 4 MG/2ML IJ SOLN
4.0000 mg | Freq: Once | INTRAMUSCULAR | Status: AC
  Administered 2017-03-15: 4 mg via INTRAVENOUS
  Filled 2017-03-15: qty 2

## 2017-03-15 MED ORDER — LOPERAMIDE HCL 2 MG PO CAPS
2.0000 mg | ORAL_CAPSULE | Freq: Once | ORAL | Status: AC
  Administered 2017-03-15: 2 mg via ORAL
  Filled 2017-03-15: qty 1

## 2017-03-15 MED ORDER — IOPAMIDOL (ISOVUE-300) INJECTION 61%
INTRAVENOUS | Status: AC
  Filled 2017-03-15: qty 100

## 2017-03-15 NOTE — ED Triage Notes (Signed)
Pt BIB [facility] from [facility]. Pt c/o N/V x3. CAOx4.

## 2017-03-15 NOTE — ED Notes (Signed)
Bed: ZS01 Expected date:  Expected time:  Means of arrival:  Comments: 83 yr old nausea, diarrhea

## 2017-03-15 NOTE — ED Notes (Signed)
ED Provider at bedside. 

## 2017-03-15 NOTE — ED Notes (Signed)
Patient transported to CT 

## 2017-03-15 NOTE — ED Provider Notes (Signed)
TIME SEEN: 4:58 AM  CHIEF COMPLAINT: Nausea, vomiting and diarrhea  HPI: Patient is a 83 year old female with history of CAD, non-Hodgkin's lymphoma currently in remission on maintenance with last chemotherapy in 2015, iron deficiency anemia who presents to the emergency department with complaints of nausea, vomiting and diarrhea for the past 3 days.  She currently lives in a nursing facility, [facility].  She states that she did not feel she needed to come to the emergency department but they sent her here because she complained of feeling very weak and felt like she might pass out.  No fevers, chills.  Not complaining of abdominal pain.  No dysuria.  She has a colostomy.  Reports she has had previous C-sections and colon resection for diverticulitis.  Denies chest pain or shortness of breath.  ROS: See HPI Constitutional: no fever  Eyes: no drainage  ENT: no runny nose   Cardiovascular:  no chest pain  Resp: no SOB  GI: Vomiting and diarrhea GU: no dysuria Integumentary: no rash  Allergy: no hives  Musculoskeletal: no leg swelling  Neurological: no slurred speech ROS otherwise negative  PAST MEDICAL HISTORY/PAST SURGICAL HISTORY:  Past Medical History:  Diagnosis Date  . GERD (gastroesophageal reflux disease)   . Heart attack ([place]) 2009  . History of blood transfusion 2009  . Iron deficiency anemia due to chronic blood loss 08/07/2014  . NHL (non-Hodgkin's lymphoma) ([place]) 10/18/2013   finished chemo dec 2015, maintenanance retuxin    MEDICATIONS:  Prior to Admission medications   Medication Sig Start Date End Date Taking? Authorizing Provider  acetaminophen (TYLENOL) 500 MG tablet Take 500 mg by mouth 2 (two) times daily.   Yes [provider]  aspirin EC 81 MG tablet Take 81 mg by mouth 2 (two) times a week. Monday and Friday   Yes [provider]  Calcium Carbonate-Vitamin D (CALCIUM 600+D) 600-400 MG-UNIT tablet Take 1 tablet by mouth daily.   Yes [provider]  Cranberry 425 MG CAPS Take 425 mg by mouth daily.    Yes [provider]  levothyroxine (SYNTHROID, LEVOTHROID) 50 MCG tablet Take 50 mcg by mouth every morning. 10/08/15  Yes [provider]  lisinopril (PRINIVIL,ZESTRIL) 20 MG tablet Take 20 mg by mouth daily.   Yes [provider]  Multiple Vitamin (MULTIVITAMIN WITH MINERALS) TABS tablet Take 1 tablet by mouth daily.   Yes [provider]  Multiple Vitamins-Minerals (RA VISION-VITE PRESERVE PO) Take 1 tablet by mouth 2 (two) times daily.    Yes [provider]  olopatadine (PATANOL) 0.1 % ophthalmic solution Place 1 drop into both eyes 2 (two) times daily.   Yes [provider]  psyllium (METAMUCIL) 58.6 % packet Take 1 packet by mouth 3 (three) times a week.   Yes [provider]  rOPINIRole (REQUIP) 1 MG tablet Take 1 mg by mouth at bedtime.   Yes [provider]  sertraline (ZOLOFT) 25 MG tablet Take 25 mg by mouth every morning.    Yes [provider]  SPIRIVA HANDIHALER 18 MCG inhalation capsule Place 18 mcg into inhaler and inhale daily.  07/23/15  Yes [provider]  LORazepam (ATIVAN) 0.5 MG tablet Take 1 tablet (0.5 mg total) by mouth 2 (two) times daily as needed for anxiety. 12/19/16   Domenic Polite, MD    ALLERGIES:  Allergies  Allergen Reactions  . Epipen [Epinephrine]     Heart palpitations  . Heparin     Unsure  of side affects (maybe affected platelets per pt)  . Levaquin [Levofloxacin In D5w] Other (See Comments)    Weakness  . Chlorhexidine Gluconate Other (See Comments)    Pt declined use and suspects she may have an intolerance,   . Sulfa Antibiotics Rash    SOCIAL HISTORY:  Social History   Tobacco Use  . Smoking status: Former Smoker    Packs/day: 1.00    Years: 25.00    Pack years: 25.00    Types: Cigarettes    Start date: 05/16/1948    Last attempt to quit: 05/17/1963    Years since quitting: 53.8   . Smokeless tobacco: Never Used  . Tobacco comment: quit smoking 50 years ago  Substance Use Topics  . Alcohol use: No    Alcohol/week: 0.0 oz    FAMILY HISTORY: Family History  Problem Relation Age of Onset  . Diverticulitis Mother   . Heart attack Mother   . Ulcers Father     EXAM: BP 134/72 (BP Location: Left Arm)   Pulse 65   Temp 98.1 F (36.7 C) (Oral)   Resp 18   SpO2 94%  CONSTITUTIONAL: Alert and oriented and responds appropriately to questions.  Elderly, obese, afebrile, nontoxic HEAD: Normocephalic EYES: Conjunctivae clear, pupils appear equal, EOMI ENT: normal nose; moist mucous membranes NECK: Supple, no meningismus, no nuchal rigidity, no LAD  CARD: RRR; S1 and S2 appreciated; no murmurs, no clicks, no rubs, no gallops RESP: Normal chest excursion without splinting or tachypnea; breath sounds clear and equal bilaterally; no wheezes, no rhonchi, no rales, no hypoxia or respiratory distress, speaking full sentences ABD/GI: Normal bowel sounds; non-distended; soft, non-tender, no rebound, no guarding, no peritoneal signs, no hepatosplenomegaly, patient has a ostomy in the left lower quadrant with liquid appearing brown output without blood BACK:  The back appears normal and is non-tender to palpation, there is no CVA tenderness EXT: Normal ROM in all joints; non-tender to palpation; no edema; normal capillary refill; no cyanosis, no calf tenderness or swelling    SKIN: Normal color for age and race; warm; no rash NEURO: Moves all extremities equally PSYCH: The patient's mood and manner are appropriate. Grooming and personal hygiene are appropriate.  MEDICAL DECISION MAKING: Patient with nausea, vomiting and diarrhea for the past 3 days.  Abdominal exam is benign.  Differential includes viral gastroenteritis, colitis, bowel obstruction, diverticulitis, infectious diarrhea.  Will obtain labs, urine and obtain a CT of her abdomen and pelvis given her age and  comorbidities.  Will give IV fluids, nausea medicine and medication for diarrhea.  Will send stool culture from her ostomy bag.  ED PROGRESS: Patient's labs are unremarkable.  No leukocytosis.  Normal electrolytes, creatinine, LFTs and lipase.  Troponin negative.  EKG shows no arrhythmia, ischemic change or interval abnormality.  Urine, stool sample and CT pending.  Signed out to Dr. Wilson Singer to follow up on these for further disposition.   I reviewed all nursing notes, vitals, pertinent previous records, EKGs, lab and urine results, imaging (as available).      EKG Interpretation  Date/Time:  Wednesday March 15 2017 06:37:33 EST Ventricular Rate:  62 PR Interval:    QRS Duration: 81 QT Interval:  428 QTC Calculation: 435 R Axis:   5 Text Interpretation:  Sinus rhythm Abnormal R-wave progression, early transition No significant change since last tracing Confirmed by Kate Sweetman, Cyril Mourning [phone]) on 03/15/2017 6:55:27 AM         Aunya Lemler, Delice Bison, DO 03/15/17 [id number]

## 2017-03-15 NOTE — ED Notes (Signed)
EKG given to EDP, Ward,MD., for review. 

## 2017-03-15 NOTE — ED Notes (Signed)
Stool sent for c-diff. Not enough stool for GI panel. Reordered.

## 2017-03-15 NOTE — ED Notes (Signed)
Pt ostomy bag was empty will check again later, pt emptied bag prior to coming to ED

## 2017-04-30 ENCOUNTER — Observation Stay (HOSPITAL_COMMUNITY): Payer: Medicare Other

## 2017-04-30 ENCOUNTER — Encounter (HOSPITAL_COMMUNITY): Payer: Self-pay | Admitting: Emergency Medicine

## 2017-04-30 ENCOUNTER — Emergency Department (HOSPITAL_COMMUNITY): Payer: Medicare Other

## 2017-04-30 ENCOUNTER — Observation Stay (HOSPITAL_COMMUNITY)
Admission: EM | Admit: 2017-04-30 | Discharge: 2017-05-01 | Disposition: A | Discharge status: Home / Self Care | Payer: Medicare Other | Attending: Internal Medicine | Admitting: Internal Medicine

## 2017-04-30 DIAGNOSIS — C8590 Non-Hodgkin lymphoma, unspecified, unspecified site: Secondary | ICD-10-CM | POA: Diagnosis present

## 2017-04-30 DIAGNOSIS — E039 Hypothyroidism, unspecified: Secondary | ICD-10-CM | POA: Diagnosis present

## 2017-04-30 DIAGNOSIS — R200 Anesthesia of skin: Secondary | ICD-10-CM | POA: Diagnosis present

## 2017-04-30 DIAGNOSIS — I252 Old myocardial infarction: Secondary | ICD-10-CM | POA: Insufficient documentation

## 2017-04-30 DIAGNOSIS — Z87891 Personal history of nicotine dependence: Secondary | ICD-10-CM | POA: Diagnosis not present

## 2017-04-30 DIAGNOSIS — Z79899 Other long term (current) drug therapy: Secondary | ICD-10-CM | POA: Diagnosis not present

## 2017-04-30 DIAGNOSIS — E785 Hyperlipidemia, unspecified: Secondary | ICD-10-CM | POA: Diagnosis present

## 2017-04-30 DIAGNOSIS — Z951 Presence of aortocoronary bypass graft: Secondary | ICD-10-CM | POA: Diagnosis not present

## 2017-04-30 DIAGNOSIS — I639 Cerebral infarction, unspecified: Secondary | ICD-10-CM

## 2017-04-30 DIAGNOSIS — Z955 Presence of coronary angioplasty implant and graft: Secondary | ICD-10-CM | POA: Insufficient documentation

## 2017-04-30 DIAGNOSIS — R8290 Unspecified abnormal findings in urine: Secondary | ICD-10-CM | POA: Diagnosis not present

## 2017-04-30 DIAGNOSIS — I6523 Occlusion and stenosis of bilateral carotid arteries: Secondary | ICD-10-CM | POA: Diagnosis present

## 2017-04-30 DIAGNOSIS — F419 Anxiety disorder, unspecified: Secondary | ICD-10-CM | POA: Diagnosis present

## 2017-04-30 DIAGNOSIS — Z7982 Long term (current) use of aspirin: Secondary | ICD-10-CM | POA: Insufficient documentation

## 2017-04-30 DIAGNOSIS — G459 Transient cerebral ischemic attack, unspecified: Secondary | ICD-10-CM | POA: Diagnosis not present

## 2017-04-30 DIAGNOSIS — Z8572 Personal history of non-Hodgkin lymphomas: Secondary | ICD-10-CM | POA: Diagnosis not present

## 2017-04-30 DIAGNOSIS — I10 Essential (primary) hypertension: Secondary | ICD-10-CM | POA: Diagnosis not present

## 2017-04-30 DIAGNOSIS — Z933 Colostomy status: Secondary | ICD-10-CM

## 2017-04-30 HISTORY — DX: Non-Hodgkin lymphoma, unspecified, unspecified site: C85.90

## 2017-04-30 LAB — CBC
HEMATOCRIT: 38.3 % (ref 36.0–46.0)
Hemoglobin: 12.3 g/dL (ref 12.0–15.0)
MCH: 30.2 pg (ref 26.0–34.0)
MCHC: 32.1 g/dL (ref 30.0–36.0)
MCV: 94.1 fL (ref 78.0–100.0)
Platelets: 153 10*3/uL (ref 150–400)
RBC: 4.07 MIL/uL (ref 3.87–5.11)
RDW: 14.9 % (ref 11.5–15.5)
WBC: 7.4 10*3/uL (ref 4.0–10.5)

## 2017-04-30 LAB — I-STAT TROPONIN, ED: TROPONIN I, POC: 0 ng/mL (ref 0.00–0.08)

## 2017-04-30 LAB — URINALYSIS, ROUTINE W REFLEX MICROSCOPIC
Bilirubin Urine: NEGATIVE
GLUCOSE, UA: NEGATIVE mg/dL
HGB URINE DIPSTICK: NEGATIVE
Ketones, ur: NEGATIVE mg/dL
NITRITE: NEGATIVE
PROTEIN: NEGATIVE mg/dL
Specific Gravity, Urine: 1.012 (ref 1.005–1.030)
Squamous Epithelial / LPF: NONE SEEN
pH: 5 (ref 5.0–8.0)

## 2017-04-30 LAB — COMPREHENSIVE METABOLIC PANEL
ALK PHOS: 64 U/L (ref 38–126)
ALT: 9 U/L — AB (ref 14–54)
AST: 22 U/L (ref 15–41)
Albumin: 3.6 g/dL (ref 3.5–5.0)
Anion gap: 9 (ref 5–15)
BILIRUBIN TOTAL: 0.7 mg/dL (ref 0.3–1.2)
BUN: 17 mg/dL (ref 6–20)
CALCIUM: 9.4 mg/dL (ref 8.9–10.3)
CO2: 22 mmol/L (ref 22–32)
CREATININE: 0.81 mg/dL (ref 0.44–1.00)
Chloride: 103 mmol/L (ref 101–111)
Glucose, Bld: 97 mg/dL (ref 65–99)
Potassium: 4 mmol/L (ref 3.5–5.1)
Sodium: 134 mmol/L — ABNORMAL LOW (ref 135–145)
Total Protein: 6 g/dL — ABNORMAL LOW (ref 6.5–8.1)

## 2017-04-30 LAB — DIFFERENTIAL
Basophils Absolute: 0 10*3/uL (ref 0.0–0.1)
Basophils Relative: 1 %
Eosinophils Absolute: 0.2 10*3/uL (ref 0.0–0.7)
Eosinophils Relative: 2 %
LYMPHS PCT: 24 %
Lymphs Abs: 1.8 10*3/uL (ref 0.7–4.0)
MONO ABS: 0.9 10*3/uL (ref 0.1–1.0)
MONOS PCT: 12 %
NEUTROS ABS: 4.5 10*3/uL (ref 1.7–7.7)
Neutrophils Relative %: 61 %

## 2017-04-30 LAB — RAPID URINE DRUG SCREEN, HOSP PERFORMED
AMPHETAMINES: NOT DETECTED
Barbiturates: NOT DETECTED
Benzodiazepines: NOT DETECTED
Cocaine: NOT DETECTED
Opiates: NOT DETECTED
Tetrahydrocannabinol: NOT DETECTED

## 2017-04-30 LAB — ETHANOL: Alcohol, Ethyl (B): <10 mg/dL (ref <10)

## 2017-04-30 LAB — APTT: aPTT: 27 seconds (ref 24–36)

## 2017-04-30 LAB — PROTIME-INR
INR: 0.96
Prothrombin Time: 12.7 seconds (ref 11.4–15.2)

## 2017-04-30 MED ORDER — ROPINIROLE HCL 1 MG PO TABS
1.0000 mg | ORAL_TABLET | Freq: Every day | ORAL | Status: DC
  Administered 2017-04-30: 1 mg via ORAL
  Filled 2017-04-30: qty 1

## 2017-04-30 MED ORDER — HYDRALAZINE HCL 20 MG/ML IJ SOLN
10.0000 mg | Freq: Four times a day (QID) | INTRAMUSCULAR | Status: DC | PRN
Start: 2017-04-30 — End: 2017-05-01

## 2017-04-30 MED ORDER — TIOTROPIUM BROMIDE MONOHYDRATE 18 MCG IN CAPS
18.0000 ug | ORAL_CAPSULE | Freq: Every day | RESPIRATORY_TRACT | Status: DC
  Administered 2017-05-01: 18 ug via RESPIRATORY_TRACT
  Filled 2017-04-30: qty 5

## 2017-04-30 MED ORDER — STROKE: EARLY STAGES OF RECOVERY BOOK
Freq: Once | Status: AC
  Administered 2017-04-30: 18:00:00
  Filled 2017-04-30: qty 1

## 2017-04-30 MED ORDER — SERTRALINE HCL 50 MG PO TABS
25.0000 mg | ORAL_TABLET | Freq: Every morning | ORAL | Status: DC
  Administered 2017-04-30 – 2017-05-01 (×2): 25 mg via ORAL
  Filled 2017-04-30 (×2): qty 1

## 2017-04-30 MED ORDER — LEVOTHYROXINE SODIUM 50 MCG PO TABS
50.0000 ug | ORAL_TABLET | Freq: Every day | ORAL | Status: DC
  Administered 2017-05-01: 50 ug via ORAL
  Filled 2017-04-30: qty 1

## 2017-04-30 MED ORDER — SIMVASTATIN 20 MG PO TABS
20.0000 mg | ORAL_TABLET | Freq: Every day | ORAL | Status: DC
  Administered 2017-04-30 – 2017-05-01 (×2): 20 mg via ORAL
  Filled 2017-04-30 (×2): qty 1

## 2017-04-30 MED ORDER — ACETAMINOPHEN 650 MG RE SUPP: 650.0000 mg | RECTAL | Status: DC | PRN

## 2017-04-30 MED ORDER — ASPIRIN 325 MG PO TABS
325.0000 mg | ORAL_TABLET | Freq: Every day | ORAL | Status: DC
  Administered 2017-04-30 – 2017-05-01 (×2): 325 mg via ORAL
  Filled 2017-04-30 (×2): qty 1

## 2017-04-30 MED ORDER — LABETALOL HCL 5 MG/ML IV SOLN
10.0000 mg | Freq: Once | INTRAVENOUS | Status: DC
  Filled 2017-04-30: qty 4

## 2017-04-30 MED ORDER — OLOPATADINE HCL 0.1 % OP SOLN
1.0000 [drp] | Freq: Two times a day (BID) | OPHTHALMIC | Status: DC
  Administered 2017-04-30 – 2017-05-01 (×3): 1 [drp] via OPHTHALMIC
  Filled 2017-04-30 (×2): qty 5

## 2017-04-30 MED ORDER — ACETAMINOPHEN 325 MG PO TABS: 650.0000 mg | ORAL_TABLET | ORAL | Status: DC | PRN

## 2017-04-30 MED ORDER — LOPERAMIDE HCL 2 MG PO CAPS: 2.0000 mg | ORAL_CAPSULE | ORAL | Status: DC | PRN

## 2017-04-30 MED ORDER — ADULT MULTIVITAMIN W/MINERALS CH
1.0000 | ORAL_TABLET | Freq: Every day | ORAL | Status: DC
  Administered 2017-04-30 – 2017-05-01 (×2): 1 via ORAL
  Filled 2017-04-30 (×2): qty 1

## 2017-04-30 MED ORDER — LORAZEPAM 2 MG/ML IJ SOLN
0.5000 mg | Freq: Once | INTRAMUSCULAR | Status: AC
  Administered 2017-04-30: 0.5 mg via INTRAVENOUS
  Filled 2017-04-30: qty 1

## 2017-04-30 MED ORDER — LORAZEPAM 0.5 MG PO TABS: 0.5000 mg | ORAL_TABLET | Freq: Two times a day (BID) | ORAL | Status: DC | PRN

## 2017-04-30 MED ORDER — CALCIUM CARBONATE-VITAMIN D 500-200 MG-UNIT PO TABS
1.0000 | ORAL_TABLET | Freq: Every day | ORAL | Status: DC
  Administered 2017-05-01: 1 via ORAL
  Filled 2017-04-30: qty 1

## 2017-04-30 MED ORDER — TIOTROPIUM BROMIDE MONOHYDRATE 18 MCG IN CAPS: 18.0000 ug | ORAL_CAPSULE | Freq: Every day | RESPIRATORY_TRACT | Status: DC

## 2017-04-30 MED ORDER — ACETAMINOPHEN 160 MG/5ML PO SOLN: 650.0000 mg | ORAL | Status: DC | PRN

## 2017-04-30 MED ORDER — LISINOPRIL 20 MG PO TABS
20.0000 mg | ORAL_TABLET | Freq: Every day | ORAL | Status: DC
  Administered 2017-04-30 – 2017-05-01 (×2): 20 mg via ORAL
  Filled 2017-04-30 (×2): qty 1

## 2017-04-30 MED ORDER — HYDRALAZINE HCL 20 MG/ML IJ SOLN: 10.0000 mg | Freq: Four times a day (QID) | INTRAMUSCULAR | Status: DC | PRN

## 2017-04-30 MED ORDER — ASPIRIN 300 MG RE SUPP: 300.0000 mg | Freq: Every day | RECTAL | Status: DC

## 2017-04-30 NOTE — Consult Note (Signed)
Referring Physician: Dr. Roxanne Mins    Chief Complaint: Transient left hand, arm and face numbness, now resolved  HPI: Makayla Fisher is an 84 y.o. female who presents to the ED after experiencing acute onset of left hand, arm and face numbness that resolved after 15 minutes. She states that currently, she is completely back to her baseline. Denies having been weak. No confusion or visual symptoms. She has no history of stroke. She takes ASA twice per week. She is not on a blood thinner.   Per RN note: "Pt arrives via [facility] from [facility], ems states pt reports she was up late watching a movie, began having left index finger numbness that radiated up her left arm to her left neck at 0300, lasted approx 15 mins. Pt reports numbness resolved on its own, no other neuro deficits associated with it. EMS stroke screen negative, pt reported no neuro hx. C/o some nausea, given 4mg  zofran pta. A/ox4, resp e/u, nad. No neuro deficits at this time, speech clear, grip strength equal, face symmetrical."   LSN: 0300 tPA Given: No: Symptoms resolved. Neurological exam nonfocal.  Past Medical History:  Diagnosis Date  . GERD (gastroesophageal reflux disease)   . Heart attack ([place]) 2009  . History of blood transfusion 2009  . Iron deficiency anemia due to chronic blood loss 08/07/2014  . NHL (non-Hodgkin's lymphoma) ([place]) 10/18/2013   finished chemo dec 2015, maintenanance retuxin    Past Surgical History:  Procedure Laterality Date  . c section  1947, 1963  . CARDIAC SURGERY  2009  . CHOLECYSTECTOMY  1973  . COLON SURGERY  2014   Colostomy  . COLOSTOMY REVISION N/A 06/12/2014   Procedure: COLOSTOMY REVISION;  Surgeon: [name], MD;  Location: [facility] ORS;  Service: General;  Laterality: N/A;  . CORONARY ANGIOPLASTY WITH STENT PLACEMENT  2009  . CORONARY ARTERY BYPASS GRAFT  aug 2009    x3  . ESOPHAGOGASTRODUODENOSCOPY  2013  . EYE SURGERY Bilateral  10 years ago   lens replacments  cataracts  . right chest pac  june 2015    Family History  Problem Relation Age of Onset  . Diverticulitis Mother   . Heart attack Mother   . Ulcers Father    Social History:  reports that she quit smoking about 53 years ago. Her smoking use included cigarettes. She started smoking about 69 years ago. She has a 25.00 pack-year smoking history. she has never used smokeless tobacco. She reports that she does not drink alcohol or use drugs.  Allergies:  Allergies  Allergen Reactions  . Epipen [Epinephrine]     Heart palpitations  . Heparin     Unsure of side affects (maybe affected platelets per pt)  . Levaquin [Levofloxacin In D5w] Other (See Comments)    Weakness  . Chlorhexidine Gluconate Other (See Comments)    Pt declined use and suspects she may have an intolerance,   . Sulfa Antibiotics Rash    Home Medications:    ROS: Mild headache. No CP or abdominal pain. Denies any neurological deficit at this time.   Physical Examination: Blood pressure (!) 181/67, Fisher 74, temperature 98 F (36.7 C), temperature source Oral, resp. rate 17, SpO2 95 %.  HEENT: [place]/AT Lungs: Respirations unlabored Ext: Warm and well-perfused  Neurologic Examination: Mental Status: Alert, oriented, thought content appropriate. Speech fluent without evidence of aphasia.  Able to follow all commands without difficulty. Cranial Nerves: II:  Visual fields intact bilaterally. No extinction. PERRL.  III,IV, VI: EOMI without nystagmus. Saccadic quality to vertical visual pursuits, but smooth horizontally. No ptosis.  V,VII: No facial droop. Facial temp sensation equal bilaterally without extinction.  VIII: HOH IX,X: Palate rises symmetrically XI: Symmetric XII: midline tongue extension  Motor: Right : Upper extremity   5/5    Left:     Upper extremity   5/5  Lower extremity   5/5     Lower extremity   5/5 Normal tone throughout; no atrophy noted No pronator drift Sensory: Temp and light touch  intact proximally in all 4 limbs. No extinction. Focused assessment of FT sensation to left hand reveals no deficit.  Deep Tendon Reflexes:  1+ bilateral upper extremities.  Unable to elicit patellar or achilles reflexes.  Toes downgoing bilaterally.  Cerebellar: No ataxia with FNF bilaterally.  Gait: Deferred due to acuity of presentation  Results for orders placed or performed during the hospital encounter of 04/30/17 (from the past 48 hour(s))  Protime-INR     Status: None   Collection Time: 04/30/17  6:49 AM  Result Value Ref Range   Prothrombin Time 12.7 11.4 - 15.2 seconds   INR 0.96   APTT     Status: None   Collection Time: 04/30/17  6:49 AM  Result Value Ref Range   aPTT 27 24 - 36 seconds  CBC     Status: None   Collection Time: 04/30/17  6:49 AM  Result Value Ref Range   WBC 7.4 4.0 - 10.5 K/uL   RBC 4.07 3.87 - 5.11 MIL/uL   Hemoglobin 12.3 12.0 - 15.0 g/dL   HCT 38.3 36.0 - 46.0 %   MCV 94.1 78.0 - 100.0 fL   MCH 30.2 26.0 - 34.0 pg   MCHC 32.1 30.0 - 36.0 g/dL   RDW 14.9 11.5 - 15.5 %   Platelets 153 150 - 400 K/uL  Differential     Status: None   Collection Time: 04/30/17  6:49 AM  Result Value Ref Range   Neutrophils Relative % 61 %   Neutro Abs 4.5 1.7 - 7.7 K/uL   Lymphocytes Relative 24 %   Lymphs Abs 1.8 0.7 - 4.0 K/uL   Monocytes Relative 12 %   Monocytes Absolute 0.9 0.1 - 1.0 K/uL   Eosinophils Relative 2 %   Eosinophils Absolute 0.2 0.0 - 0.7 K/uL   Basophils Relative 1 %   Basophils Absolute 0.0 0.0 - 0.1 K/uL  I-stat troponin, ED     Status: None   Collection Time: 04/30/17  6:56 AM  Result Value Ref Range   Troponin i, poc 0.00 0.00 - 0.08 ng/mL   Comment 3            Comment: Due to the release kinetics of cTnI, a negative result within the first hours of the onset of symptoms does not rule out myocardial infarction with certainty. If myocardial infarction is still suspected, repeat the test at appropriate intervals.    Ct Head Code  Stroke Wo Contrast  Result Date: 04/30/2017 CLINICAL DATA:  Code stroke. Head heaviness. LEFT-sided sensory symptoms. EXAM: CT HEAD WITHOUT CONTRAST TECHNIQUE: Contiguous axial images were obtained from the base of the skull through the vertex without intravenous contrast. COMPARISON:  09/19/2013. FINDINGS: Brain: No evidence for acute infarction, hemorrhage, mass lesion, hydrocephalus, or extra-axial fluid. Generalized atrophy, not unexpected for age. Hypoattenuation of white matter, consistent with small vessel disease, appears moderate to advanced. Vascular: Calcification of the cavernous internal carotid arteries and  distal vertebral arteries consistent with cerebrovascular atherosclerotic disease. No signs of intracranial large vessel occlusion. Skull: Normal. Negative for fracture or focal lesion. Sinuses/Orbits: No acute finding.  BILATERAL cataract extraction. Other: None. Compared with 2015, atrophic change in the brain has progressed. ASPECTS [facility] Stroke Program Early CT Score) - Ganglionic level infarction (caudate, lentiform nuclei, internal capsule, insula, M1-M3 cortex): 7 - Supraganglionic infarction (M4-M6 cortex): 3 Total score (0-10 with 10 being normal): 10 IMPRESSION: 1. Generalized atrophy with moderate to extensive small vessel disease. No acute findings are evident 2. ASPECTS is 10. These results were communicated to Dr. Cheral Marker At 7:01 amon 1/6/2019by text page via the [facility] messaging system. Electronically Signed   By: Staci Righter M.D.   On: 04/30/2017 07:04    Assessment: 84 y.o. female with transient left hand, arm and face numbness lasting 15 minutes 1. Neurological examination nonfocal. NIHSS = 0. Currently asymptomatic.  2. Generalized atrophy with moderate to extensive small vessel disease. No acute findings are evident 2. ASPECTS is 10. 3. Stroke Risk Factors - history of MI and lymphoma  Plan: 1. HgbA1c, fasting lipid panel 2. MRI, MRA of the brain without  contrast 3. PT consult, OT consult, Speech consult 4. Echocardiogram 5. Carotid dopplers 6. Switch twice weekly ASA to qd ASA 81 mg 7. Risk factor modification 8. Telemetry monitoring 9. Frequent neuro checks 10. Risks of complications from statin felt to outweigh potential benefits given advanced age.  11. Modified permissive HTN protocol given advanced age/frailty. Treat if SBP goes above 180.   @Electronically  signed: Dr. Kerney Elbe  04/30/2017, 7:20 AM

## 2017-04-30 NOTE — ED Provider Notes (Addendum)
[place] EMERGENCY DEPARTMENT Provider Note   CSN: [id number] Arrival date & time: 04/30/17  5009     History   Chief Complaint Chief Complaint  Patient presents with  . Numbness    HPI Makayla Fisher is a 84 y.o. female.  The history is provided by the patient.  She has history of coronary artery disease status post bypass, lymphoma, GERD.  She had onset at 315 of numbness in her left index finger which gradually spread to all of the fingers of her hand and then radiated up her arm.  This lasted about 15 minutes before resolving.  She also complains of a vague feeling of things not being right around her shoulder and neck.  She denies any headache.  She was not aware of any weakness.  She feels like she is back to normal now.  Past Medical History:  Diagnosis Date  . GERD (gastroesophageal reflux disease)   . Heart attack ([place]) 2009  . History of blood transfusion 2009  . Iron deficiency anemia due to chronic blood loss 08/07/2014  . NHL (non-Hodgkin's lymphoma) ([place]) 10/18/2013   finished chemo dec 2015, maintenanance retuxin    Patient Active Problem List   Diagnosis Date Noted  . CAP (community acquired pneumonia) 12/13/2016  . Acute lower UTI 12/13/2016  . Glaucoma 12/13/2016  . Colostomy in place [facility]) 12/13/2016  . Vitamin D deficiency 10/20/2016  . Follicular lymphoma grade IIIa of intra-abdominal lymph nodes ([place]) 04/22/2016  . Iron deficiency anemia due to chronic blood loss 08/07/2014  . HTN (hypertension) 07/15/2014  . Unilateral carotid artery disease ([place]) 07/15/2014  . Bradycardia 07/15/2014  . Colon stricture ([place]) 06/13/2014  . 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 Preop cardiovascular exam 04/08/2014  . NHL (non-Hodgkin's lymphoma) ([place]) 10/18/2013  . 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 c section  1947, 1963  . CARDIAC SURGERY  2009  . CHOLECYSTECTOMY  1973  . COLON  SURGERY  2014   Colostomy  . COLOSTOMY REVISION N/A 06/12/2014   Procedure: COLOSTOMY REVISION;  Surgeon: [name], MD;  Location: [facility] ORS;  Service: General;  Laterality: N/A;  . CORONARY ANGIOPLASTY WITH STENT PLACEMENT  2009  . CORONARY ARTERY BYPASS GRAFT  aug 2009    x3  . ESOPHAGOGASTRODUODENOSCOPY  2013  . EYE SURGERY Bilateral  10 years ago   lens replacments cataracts  . right chest pac  june 2015    OB History    No data available       Home Medications    Prior to Admission medications   Medication Sig Start Date End Date Taking? Authorizing Provider  acetaminophen (TYLENOL) 500 MG tablet Take 500 mg by mouth 2 (two) times daily.    [provider]  aspirin EC 81 MG tablet Take 81 mg by mouth 2 (two) times a week. Monday and Friday    [provider]  Calcium Carbonate-Vitamin D (CALCIUM 600+D) 600-400 MG-UNIT tablet Take 1 tablet by mouth daily.    [provider]  Cranberry 425 MG CAPS Take 425 mg by mouth daily.     [provider]  levothyroxine (SYNTHROID, LEVOTHROID) 50 MCG tablet Take 50 mcg by mouth every morning. 10/08/15   [provider]  lisinopril (PRINIVIL,ZESTRIL) 20 MG tablet Take 20 mg by mouth daily.    [provider]  loperamide (IMODIUM) 2 MG capsule Take 1 capsule (2 mg total) by  mouth every 4 (four) hours as needed for diarrhea or loose stools. 1 tablet with every unformed stool up to 6 doses per day 03/15/17   Virgel Manifold, MD  LORazepam (ATIVAN) 0.5 MG tablet Take 1 tablet (0.5 mg total) by mouth 2 (two) times daily as needed for anxiety. 12/19/16   Domenic Polite, MD  Multiple Vitamin (MULTIVITAMIN WITH MINERALS) TABS tablet Take 1 tablet by mouth daily.    [provider]  Multiple Vitamins-Minerals (RA VISION-VITE PRESERVE PO) Take 1 tablet by mouth 2 (two) times daily.     [provider]  olopatadine (PATANOL) 0.1 % ophthalmic solution Place 1 drop into both eyes 2  (two) times daily.    [provider]  psyllium (METAMUCIL) 58.6 % packet Take 1 packet by mouth 3 (three) times a week.    [provider]  rOPINIRole (REQUIP) 1 MG tablet Take 1 mg by mouth at bedtime.    [provider]  sertraline (ZOLOFT) 25 MG tablet Take 25 mg by mouth every morning.     [provider]  SPIRIVA HANDIHALER 18 MCG inhalation capsule Place 18 mcg into inhaler and inhale daily.  07/23/15   [provider]    Family History Family History  Problem Relation Age of Onset  . Diverticulitis Mother   . Heart attack Mother   . Ulcers Father     Social History Social History   Tobacco Use  . Smoking status: Former Smoker    Packs/day: 1.00    Years: 25.00    Pack years: 25.00    Types: Cigarettes    Start date: 05/16/1948    Last attempt to quit: 05/17/1963    Years since quitting: 53.9  . Smokeless tobacco: Never Used  . Tobacco comment: quit smoking 50 years ago  Substance Use Topics  . Alcohol use: No    Alcohol/week: 0.0 oz  . Drug use: No     Allergies   Epipen [epinephrine]; Heparin; Levaquin [levofloxacin in d5w]; Chlorhexidine gluconate; and Sulfa antibiotics   Review of Systems Review of Systems  All other systems reviewed and are negative.    Physical Exam Updated Vital Signs BP (!) 181/67   Pulse 74   Temp 98 F (36.7 C) (Oral)   Resp 17   SpO2 95%   Physical Exam  Nursing note and vitals reviewed.  84 year old female, resting comfortably and in no acute distress. Vital signs are significant for hypertension. Oxygen saturation is 95%, which is normal. Head is normocephalic and atraumatic. PERRLA, EOMI. Oropharynx is clear. Neck is nontender and supple without adenopathy or JVD.  There are no carotid bruits. Back is nontender and there is no CVA tenderness. Lungs are clear without rales, wheezes, or rhonchi. Chest is nontender. Heart has regular rate and rhythm with 2/6 late systolic  murmur at the upper left and right sternal border. Abdomen is soft, flat, nontender without masses or hepatosplenomegaly and peristalsis is normoactive. Extremities have no cyanosis or edema, full range of motion is present. Skin is warm and dry without rash. Neurologic: Mental status is normal, cranial nerves are intact.  There is no pronator drift.  There is slight weakness of elbow flexion on the left with strength 4/5, normal strength of elbow extension.  On direct testing, there are no sensory deficits, but there is extinction on the left with double simultaneous stimulation.  ED Treatments / Results  Labs (all labs ordered are listed, but only abnormal results are  displayed) Labs Reviewed  COMPREHENSIVE METABOLIC PANEL - Abnormal; Notable for the following components:      Result Value   Sodium 134 (*)    Total Protein 6.0 (*)    ALT 9 (*)    All other components within normal limits  ETHANOL  PROTIME-INR  APTT  CBC  DIFFERENTIAL  RAPID URINE DRUG SCREEN, HOSP PERFORMED  URINALYSIS, ROUTINE W REFLEX MICROSCOPIC  I-STAT TROPONIN, ED    EKG  EKG Interpretation  Date/Time:  Sunday April 30 2017 07:46:39 EST Ventricular Rate:  70 PR Interval:    QRS Duration: 84 QT Interval:  389 QTC Calculation: 420 R Axis:   20 Text Interpretation:  Sinus rhythm since last tracing no significant change Confirmed by Malvin Johns [phone]) on 04/30/2017 7:57:26 AM       Radiology Ct Head Code Stroke Wo Contrast  Result Date: 04/30/2017 CLINICAL DATA:  Code stroke. Head heaviness. LEFT-sided sensory symptoms. EXAM: CT HEAD WITHOUT CONTRAST TECHNIQUE: Contiguous axial images were obtained from the base of the skull through the vertex without intravenous contrast. COMPARISON:  09/19/2013. FINDINGS: Brain: No evidence for acute infarction, hemorrhage, mass lesion, hydrocephalus, or extra-axial fluid. Generalized atrophy, not unexpected for age. Hypoattenuation of white matter, consistent with  small vessel disease, appears moderate to advanced. Vascular: Calcification of the cavernous internal carotid arteries and distal vertebral arteries consistent with cerebrovascular atherosclerotic disease. No signs of intracranial large vessel occlusion. Skull: Normal. Negative for fracture or focal lesion. Sinuses/Orbits: No acute finding.  BILATERAL cataract extraction. Other: None. Compared with 2015, atrophic change in the brain has progressed. ASPECTS [facility] Stroke Program Early CT Score) - Ganglionic level infarction (caudate, lentiform nuclei, internal capsule, insula, M1-M3 cortex): 7 - Supraganglionic infarction (M4-M6 cortex): 3 Total score (0-10 with 10 being normal): 10 IMPRESSION: 1. Generalized atrophy with moderate to extensive small vessel disease. No acute findings are evident 2. ASPECTS is 10. These results were communicated to Dr. Cheral Marker At 7:01 amon 1/6/2019by text page via the [facility] messaging system. Electronically Signed   By: Staci Righter M.D.   On: 04/30/2017 07:04    Procedures Procedures (including critical care time) CRITICAL CARE Performed by: Delora Fuel Total critical care time: 45 minutes Critical care time was exclusive of separately billable procedures and treating other patients. Critical care was necessary to treat or prevent imminent or life-threatening deterioration. Critical care was time spent personally by me on the following activities: development of treatment plan with patient and/or surrogate as well as nursing, discussions with consultants, evaluation of patient's response to treatment, examination of patient, obtaining history from patient or surrogate, ordering and performing treatments and interventions, ordering and review of laboratory studies, ordering and review of radiographic studies, pulse oximetry and re-evaluation of patient's condition.  Medications Ordered in ED Medications  LORazepam (ATIVAN) injection 0.5 mg (not administered)  labetalol  (NORMODYNE,TRANDATE) injection 10 mg (not administered)     Initial Impression / Assessment and Plan / ED Course  I have reviewed the triage vital signs and the nursing notes.  Pertinent labs & imaging results that were available during my care of the patient were reviewed by me and considered in my medical decision making (see chart for details).  Subtle weakness and decreased sensation in the left arm worrisome for small stroke.  She is technically within the window for thrombolytic therapy, but doubt this would be indicated due to low NIH stroke scale.  Symptoms could also be related to cervical radiculopathy.  Code stroke is  activated.  Old records are reviewed confirming diagnosis of non-Hodgkin's lymphoma treated with rituximab.  I do not find evidence that she was given any neurotoxic chemotherapy.  CT and initial laboratory workup are all unremarkable.  CT does show evidence of prior stroke.  Will send for MRI of brain.  Neurology consult appreciated, patient will be admitted to hospitalist service.  Neurologist recommends treating blood pressure over 643 systolic.  Currently, blood pressures running 329-518 systolic.  She is given a dose of labetalol.  Final Clinical Impressions(s) / ED Diagnoses   Final diagnoses:  Cerebrovascular accident (CVA), unspecified mechanism [facility])  Essential hypertension    ED Discharge Orders    None       Delora Fuel, MD 84/16/60 [id number]    Delora Fuel, MD 60/10/93 [phone]

## 2017-04-30 NOTE — ED Notes (Signed)
Patient currently still in MRI

## 2017-04-30 NOTE — ED Notes (Signed)
ED Provider at bedside. 

## 2017-04-30 NOTE — ED Notes (Signed)
Patient transported to MRI 

## 2017-04-30 NOTE — ED Notes (Signed)
Delay in lab draw,  Pt not in room 

## 2017-04-30 NOTE — ED Triage Notes (Addendum)
Pt arrives via [facility] from [facility], ems states pt reports she was up late watching a movie, began having left index finger numbness that radiated up her left arm to her left neck at 0300, lasted approx 15 mins. Pt reports numbness resolved on its own, no other neuro deficits associated with it. EMS stroke screen negative, pt reported no neuro hx. C/o some nausea, given 4mg  zofran pta. A/ox4, resp e/u, nad. No neuro deficits at this time, speech clear, grip strength equal, face symmetrical.

## 2017-04-30 NOTE — H&P (Signed)
History and Physical    Makayla Fisher NAT:[id number] DOB: 02-21-1923 DOA: 04/30/2017   PCP: [name], DO /UNASSIGNED  Attending physician: Jamse Arn  Patient coming from/Resides with: [facility] assisted living  Chief Complaint: Neck and arm numbness  HPI: Makayla Fisher is a 84 y.o. female with medical history significant for hypertension, known nonobstructive bilateral carotid stenosis, CABG x3 and prior stent, dyslipidemia, hypothyroidism, anxiety disorder and non-Hodgkin's lymphoma in remission.  She was in her usual state of health and was up watching a movie around 3:00 this morning when she began having numbness in her left index finger that eventually radiated up the arm to about the elbow level but not to the shoulder.  At the same time she noticed some numbness in the back part of her neck.  She did get up to walk around and felt like the left arm may be dangling but denied motor difficulty.  She notified the staff at assisted living and EMS was called and upon arrival to the ER symptoms had essentially resolved.  Patient reports having a "trigger finger" of the index finger and suspect this may be the etiology to her symptoms.  Initial CT of the head was unremarkable.  Labs are unremarkable.  Neurology has evaluated the patient and is recommending TIA/stroke rule out observation /evaluation.  ED Course:  Vital Signs: BP (!) 192/59   Pulse 69   Temp 98 F (36.7 C) (Oral)   Resp 15   SpO2 95%  CT head without contrast: Generalized atrophy with moderate to extensive small vessel disease with no acute findings Lab data: 134, potassium 4.0, chloride 103, CO2 22, glucose 97, BUN 17, creatinine 0.81, LFTs normal/not elevated, poc: 0.00, white count 7400 with normal differential, hemoglobin 12.3, platelets 153,000.  Coags are normal.  Urinalysis abnormal with straw-colored appearance, leukocytes are moderate, bacteria rare, WBC 6-30. Medications and treatments: Ativan 0.5  mg IV prior to MRI  Review of Systems:  In addition to the HPI above,  No Fever-chills, myalgias or other constitutional symptoms No Headache, changes with Vision or hearing, new weakness, tingling, no dizziness, dysarthria or word finding difficulty, gait disturbance or imbalance, tremors or seizure activity No problems swallowing food or Liquids, indigestion/reflux, choking or coughing while eating, abdominal pain with or after eating +GI path PCR for rotavirus on 11/23 No Chest pain, Cough or Shortness of Breath, palpitations, orthopnea or DOE No Abdominal pain, N/V, melena,hematochezia, dark tarry stools, constipation No dysuria, malodorous urine, hematuria or flank pain; recent outpatient treatment for UTI with Keflex/currently asymptomatic No new skin rashes, lesions, masses or bruises, No new joint pains, aches, swelling or redness No recent unintentional weight gain or loss No polyuria, polydypsia or polyphagia   Past Medical History:  Diagnosis Date  . GERD (gastroesophageal reflux disease)   . Heart attack ([place]) 2009  . History of blood transfusion 2009  . Iron deficiency anemia due to chronic blood loss 08/07/2014  . NHL (non-Hodgkin's lymphoma) ([place]) 10/18/2013   finished chemo dec 2015, maintenanance retuxin    Past Surgical History:  Procedure Laterality Date  . c section  1947, 1963  . CARDIAC SURGERY  2009  . CHOLECYSTECTOMY  1973  . COLON SURGERY  2014   Colostomy  . COLOSTOMY REVISION N/A 06/12/2014   Procedure: COLOSTOMY REVISION;  Surgeon: [name], MD;  Location: [facility];  Service: General;  Laterality: N/A;  . CORONARY ANGIOPLASTY WITH STENT PLACEMENT  2009  . CORONARY ARTERY BYPASS GRAFT  aug 2009  x3  . ESOPHAGOGASTRODUODENOSCOPY  2013  . EYE SURGERY Bilateral  10 years ago   lens replacments cataracts  . right chest pac  june 2015    Social History   Socioeconomic History  . Marital status: Widowed    Spouse name: Not on file  . Number of  children: Not on file  . Years of education: Not on file  . Highest education level: Not on file  Social Needs  . Financial resource strain: Not on file  . Food insecurity - worry: Not on file  . Food insecurity - inability: Not on file  . Transportation needs - medical: Not on file  . Transportation needs - non-medical: Not on file  Occupational History  . Not on file  Tobacco Use  . Smoking status: Former Smoker    Packs/day: 1.00    Years: 25.00    Pack years: 25.00    Types: Cigarettes    Start date: 05/16/1948    Last attempt to quit: 05/17/1963    Years since quitting: 53.9  . Smokeless tobacco: Never Used  . Tobacco comment: quit smoking 50 years ago  Substance and Sexual Activity  . Alcohol use: No    Alcohol/week: 0.0 oz  . Drug use: No  . Sexual activity: Not on file  Other Topics Concern  . Not on file  Social History Narrative  . Not on file    Mobility: Rolling walker Work history: Not obtained   Allergies  Allergen Reactions  . Epipen [Epinephrine]     Heart palpitations  . Heparin     Unsure of side affects (maybe affected platelets per pt)  . Levaquin [Levofloxacin In D5w] Other (See Comments)    Weakness  . Chlorhexidine Gluconate Other (See Comments)    Pt declined use and suspects she may have an intolerance,   . Sulfa Antibiotics Rash    Family History  Problem Relation Age of Onset  . Diverticulitis Mother   . Heart attack Mother   . Ulcers Father      Prior to Admission medications   Medication Sig Start Date End Date Taking? Authorizing Provider  acetaminophen (TYLENOL) 500 MG tablet Take 500 mg by mouth 2 (two) times daily.   Yes [provider]  Calcium Carbonate-Vitamin D (CALCIUM 600+D) 600-400 MG-UNIT tablet Take 1 tablet by mouth daily.   Yes [provider]  Cranberry 425 MG CAPS Take 425 mg by mouth daily.    Yes [provider]  levothyroxine (SYNTHROID, LEVOTHROID) 50 MCG tablet Take 50 mcg by  mouth every morning. 10/08/15  Yes [provider]  lisinopril (PRINIVIL,ZESTRIL) 20 MG tablet Take 20 mg by mouth daily.   Yes [provider]  Multiple Vitamin (MULTIVITAMIN WITH MINERALS) TABS tablet Take 1 tablet by mouth daily.   Yes [provider]  Multiple Vitamins-Minerals (RA VISION-VITE PRESERVE PO) Take 1 tablet by mouth 2 (two) times daily.    Yes [provider]  psyllium (METAMUCIL) 58.6 % packet Take 1 packet by mouth 3 (three) times a week.   Yes [provider]  rOPINIRole (REQUIP) 1 MG tablet Take 1 mg by mouth at bedtime.   Yes [provider]  sertraline (ZOLOFT) 25 MG tablet Take 25 mg by mouth every morning.    Yes [provider]  SPIRIVA HANDIHALER 18 MCG inhalation capsule Place 18 mcg into inhaler and inhale daily.  07/23/15  Yes [provider]  aspirin EC 81 MG tablet  Take 81 mg by mouth 2 (two) times a week. Monday and Friday    [provider]  loperamide (IMODIUM) 2 MG capsule Take 1 capsule (2 mg total) by mouth every 4 (four) hours as needed for diarrhea or loose stools. 1 tablet with every unformed stool up to 6 doses per day 03/15/17   Virgel Manifold, MD  LORazepam (ATIVAN) 0.5 MG tablet Take 1 tablet (0.5 mg total) by mouth 2 (two) times daily as needed for anxiety. 12/19/16   Domenic Polite, MD  olopatadine (PATANOL) 0.1 % ophthalmic solution Place 1 drop into both eyes 2 (two) times daily.    [provider]    Physical Exam: Vitals:   04/30/17 0730 04/30/17 0800 04/30/17 0830 04/30/17 0900  BP: (!) 202/65 (!) 204/59 (!) 189/66 (!) 192/59  Pulse: 69 65 70 69  Resp: 14 17 20 15   Temp:      TempSrc:      SpO2: 93% 97% 95% 95%      Constitutional: NAD, calm, comfortable Eyes: PERRL, lids and conjunctivae normal ENMT: Mucous membranes are moist. Posterior pharynx clear of any exudate or lesions.age-appropriate dentition.  Neck: normal, supple, no masses, no  thyromegaly Respiratory: clear to auscultation bilaterally, no wheezing, no crackles. Normal respiratory effort. No accessory muscle use.  Cardiovascular: Regular rate and rhythm, no murmurs / rubs / gallops. No extremity edema. 2+ pedal pulses. No carotid bruits.  Abdomen: no tenderness, no masses palpated. No hepatosplenomegaly. Bowel sounds positive.  Multiple scars on abdominal wall that are all well-healed.  Left mid quadrant ostomy intact with grainy stool. Musculoskeletal: no clubbing / cyanosis. No joint deformity upper and lower extremities. Good ROM, no contractures. Normal muscle tone.  Skin: no rashes, lesions, ulcers. No induration Neurologic: CN 2-12 grossly intact except for known hearing loss/requires hearing aids. Sensation intact, DTR normal. Strength 5/5 x all 4 extremities.  Psychiatric: Normal judgment and insight.  Seems to have some minor short-term memory deficits.  Alert and oriented x 3. Normal mood.    Labs on Admission: I have personally reviewed following labs and imaging studies  CBC: Recent Labs  Lab 04/30/17 0649  WBC 7.4  NEUTROABS 4.5  HGB 12.3  HCT 38.3  MCV 94.1  PLT 875   Basic Metabolic Panel: Recent Labs  Lab 04/30/17 0649  NA 134*  K 4.0  CL 103  CO2 22  GLUCOSE 97  BUN 17  CREATININE 0.81  CALCIUM 9.4   GFR: CrCl cannot be calculated (Unknown ideal weight.). Liver Function Tests: Recent Labs  Lab 04/30/17 0649  AST 22  ALT 9*  ALKPHOS 64  BILITOT 0.7  PROT 6.0*  ALBUMIN 3.6   No results for input(s): LIPASE, AMYLASE in the last 168 hours. No results for input(s): AMMONIA in the last 168 hours. Coagulation Profile: Recent Labs  Lab 04/30/17 0649  INR 0.96   Cardiac Enzymes: No results for input(s): CKTOTAL, CKMB, CKMBINDEX, TROPONINI in the last 168 hours. BNP (last 3 results) No results for input(s): PROBNP in the last 8760 hours. HbA1C: No results for input(s): HGBA1C in the last 72 hours. CBG: No results for  input(s): GLUCAP in the last 168 hours. Lipid Profile: No results for input(s): CHOL, HDL, LDLCALC, TRIG, CHOLHDL, LDLDIRECT in the last 72 hours. Thyroid Function Tests: No results for input(s): TSH, T4TOTAL, FREET4, T3FREE, THYROIDAB in the last 72 hours. Anemia Panel: No results for input(s): VITAMINB12, FOLATE, FERRITIN, TIBC, IRON, RETICCTPCT in the last 72 hours. Urine  analysis:    Component Value Date/Time   COLORURINE STRAW (A) 04/30/2017 0723   APPEARANCEUR CLEAR 04/30/2017 0723   LABSPEC 1.012 04/30/2017 0723   PHURINE 5.0 04/30/2017 0723   GLUCOSEU NEGATIVE 04/30/2017 0723   HGBUR NEGATIVE 04/30/2017 0723   BILIRUBINUR NEGATIVE 04/30/2017 0723   KETONESUR NEGATIVE 04/30/2017 0723   PROTEINUR NEGATIVE 04/30/2017 0723   UROBILINOGEN 0.2 08/19/2013 1624   NITRITE NEGATIVE 04/30/2017 0723   LEUKOCYTESUR MODERATE (A) 04/30/2017 0723   Sepsis Labs: @LABRCNTIP (procalcitonin:4,lacticidven:4) )No results found for this or any previous visit (from the past 240 hour(s)).   Radiological Exams on Admission: Ct Head Code Stroke Wo Contrast  Result Date: 04/30/2017 CLINICAL DATA:  Code stroke. Head heaviness. LEFT-sided sensory symptoms. EXAM: CT HEAD WITHOUT CONTRAST TECHNIQUE: Contiguous axial images were obtained from the base of the skull through the vertex without intravenous contrast. COMPARISON:  09/19/2013. FINDINGS: Brain: No evidence for acute infarction, hemorrhage, mass lesion, hydrocephalus, or extra-axial fluid. Generalized atrophy, not unexpected for age. Hypoattenuation of white matter, consistent with small vessel disease, appears moderate to advanced. Vascular: Calcification of the cavernous internal carotid arteries and distal vertebral arteries consistent with cerebrovascular atherosclerotic disease. No signs of intracranial large vessel occlusion. Skull: Normal. Negative for fracture or focal lesion. Sinuses/Orbits: No acute finding.  BILATERAL cataract extraction.  Other: None. Compared with 2015, atrophic change in the brain has progressed. ASPECTS [facility] Stroke Program Early CT Score) - Ganglionic level infarction (caudate, lentiform nuclei, internal capsule, insula, M1-M3 cortex): 7 - Supraganglionic infarction (M4-M6 cortex): 3 Total score (0-10 with 10 being normal): 10 IMPRESSION: 1. Generalized atrophy with moderate to extensive small vessel disease. No acute findings are evident 2. ASPECTS is 10. These results were communicated to Dr. Cheral Marker At 7:01 amon 1/6/2019by text page via the [facility] messaging system. Electronically Signed   By: Staci Righter M.D.   On: 04/30/2017 07:04    EKG: (Independently reviewed) sinus rhythm with ventricular rate 70 bpm, QTC 420 ms, normal R wave rotation, nonspecific ST changes V3 but otherwise no acute changes consistent with ischemia and unchanged since previous EKG  Assessment/Plan Principal Problem:   TIA (transient ischemic attack) -Patient presented to ER with numbness of left arm and left posterior neck duration of symptoms 15 minutes now resolved concerning for possible TIA -CT head unremarkable for acute stroke -MRI/MRA of brain stat -Appreciate neurology assistance -Frequent neurological checks -Echocardiogram/carotid duplex -HgbA1c/FLP -On baby aspirin twice weekly prior to admission-initiate full dose aspirin now-patient has remote history of thrombocytopenia/?  If occurred several years ago during protracted hospitalization with sepsis physiology after perforated viscus -PT/OT/SLP evaluation  ** MRI/MRA neg for acute CVA  Active Problems:   Carotid stenosis -Last carotid duplex was 05/20/16 that demonstrated stable 1-39% bilateral ICA stenosis at the high end range recommendations to repeat duplex in 1 year    Hx of CABG x 3/stent (2009) -Procedures completed in [place] -Patient has history of bradycardia on beta-blocker-appears to no longer be on beta-blocker -OP cardiology documented  no indication for pacemaker and no mention that beta-blocker had been discontinued (Oct 2016 she was on carvedilol 3.125 mg daily-- according to med rec for multiple April MD visits in 2017 patient no longer on this medication) -Cardiologist also documented patient was to continue simvastatin with target LDL under 100-in review of previous notes and accompanying med rec it appears patient was no longer taking simvastatin and based on current med rec she was not on simvastatin immediately prior to current presentation (  Oct 2016 she was on simvastatin 10 mg daily-- according to med rec for multiple April MD visits in 2017 no longer on this medication) -I have reviewed chart documentation and see no indication of when these medications were actually stopped and no listing in the allergies that she is allergic and I suspect these medications have been inadvertently removed from her med list -Given presentation with TIA symptoms as well as known CAD will resume simvastatin at 20 mg daily noting current LFTs are within normal limits -Lipid panel as above    Hypertension -Allowing for permissive hypertension until acute CVA ruled out -On lisinopril 20 mg daily prior to admission  *No stroke so permissive HTN not indicated-resume preadmit Lisinopril    HLD (hyperlipidemia) -Resuming statin this admission and will need prescription at time of discharge    Non Hodgkin's lymphoma  -Last evaluated by hematology/Ennever on 01/31/17 -No evidence of recurrent disease and plans were to follow-up in 3 months -History of iron deficiency anemia in context of malabsorption and had previously required IV iron infusions    Hypothyroidism -Continue Synthroid    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  Hospital course was complicated by acute  renal failure/sepsis/mechanical ventilation/??  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 -She underwent colostomy revision in 2015 secondary to stricture -Has issues with postprandial diarrhea and requires prn Imodium    Anxiety disorder -Significant issue for patient -Continue preadmission Ativan and Zoloft -?? Component of restless legs noting patient on Requip prior to admission    Abnormal urinalysis -Patient reports recent outpatient treatment for UTI with?  Keflex -Current urinalysis is abnormal but patient is asymptomatic -Obtain urine culture and treat if positive      DVT prophylaxis: SCDs due to reported allergy to heparin/?  Remote history of HIT Code Status: Full code-extensive discussion with patient greater than 20 minutes Family Communication: Niece at bedside Disposition Plan: ALF Consults called: Neurology/[place]    Makayla Fisher L. ANP-BC [facility] Pager [phone]   If 7PM-7AM, please contact night-coverage [email or web address] Password [email or web address]  04/30/2017, 9:22 AM

## 2017-04-30 NOTE — ED Notes (Signed)
Two family members arrived at bedside patient still in MRI.

## 2017-04-30 NOTE — ED Notes (Signed)
Patient arrived to room from MRI.

## 2017-05-01 ENCOUNTER — Other Ambulatory Visit (HOSPITAL_COMMUNITY): Payer: Medicare Other

## 2017-05-01 ENCOUNTER — Observation Stay (HOSPITAL_BASED_OUTPATIENT_CLINIC_OR_DEPARTMENT_OTHER): Payer: Medicare Other

## 2017-05-01 ENCOUNTER — Ambulatory Visit (HOSPITAL_COMMUNITY): Payer: Medicare Other

## 2017-05-01 DIAGNOSIS — E785 Hyperlipidemia, unspecified: Secondary | ICD-10-CM

## 2017-05-01 DIAGNOSIS — I340 Nonrheumatic mitral (valve) insufficiency: Secondary | ICD-10-CM

## 2017-05-01 DIAGNOSIS — R8290 Unspecified abnormal findings in urine: Secondary | ICD-10-CM | POA: Diagnosis not present

## 2017-05-01 DIAGNOSIS — G459 Transient cerebral ischemic attack, unspecified: Secondary | ICD-10-CM | POA: Diagnosis not present

## 2017-05-01 LAB — ECHOCARDIOGRAM COMPLETE: WEIGHTICAEL: 2640 [oz_av]

## 2017-05-01 LAB — LIPID PANEL
CHOL/HDL RATIO: 3 ratio
CHOLESTEROL: 189 mg/dL (ref 0–200)
HDL: 62 mg/dL (ref >40)
LDL Cholesterol: 113 mg/dL — ABNORMAL HIGH (ref 0–99)
Triglycerides: 71 mg/dL (ref <150)
VLDL: 14 mg/dL (ref 0–40)

## 2017-05-01 LAB — URINE CULTURE: Culture: NO GROWTH

## 2017-05-01 LAB — BRAIN NATRIURETIC PEPTIDE: B Natriuretic Peptide: 119.8 pg/mL — ABNORMAL HIGH (ref 0.0–100.0)

## 2017-05-01 LAB — VITAMIN B12: VITAMIN B 12: 315 pg/mL (ref 180–914)

## 2017-05-01 LAB — HEMOGLOBIN A1C
Hgb A1c MFr Bld: 5.2 % (ref 4.8–5.6)
Mean Plasma Glucose: 102.54 mg/dL

## 2017-05-01 LAB — TSH: TSH: 2.579 u[IU]/mL (ref 0.350–4.500)

## 2017-05-01 MED ORDER — SIMVASTATIN 20 MG PO TABS: 20.0000 mg | ORAL_TABLET | Freq: Every day | ORAL | 0 refills | Status: DC

## 2017-05-01 MED ORDER — ASPIRIN 81 MG PO TBEC: 81.0000 mg | DELAYED_RELEASE_TABLET | Freq: Every day | ORAL | Status: DC

## 2017-05-01 MED ORDER — ASPIRIN EC 81 MG PO TBEC
81.0000 mg | DELAYED_RELEASE_TABLET | Freq: Every day | ORAL | Status: DC
  Administered 2017-05-01: 81 mg via ORAL
  Filled 2017-05-01: qty 1

## 2017-05-01 NOTE — Evaluation (Addendum)
Occupational Therapy Evaluation Patient Details Name: Makayla Fisher MRN: [id number] DOB: 1922-10-31 Today's Date: 05/01/2017    History of Present Illness 84 y.o. female with medical history significant for hypertension, known nonobstructive bilateral carotid stenosis, CABG x3 and prior stent, dyslipidemia, hypothyroidism, anxiety disorder and non-Hodgkin's lymphoma in remission.  She was in her usual state of health and was up watching a movie around 3:00 this morning when she began having numbness in her left index finger that eventually radiated up the arm to about the elbow level but not to the shoulder.  Initial CT of the head was unremarkable MRI reveals Atrophy and small vessel disease.  No acute intracranial findings.   Clinical Impression   Pt admitted as above currently demonstrating generalized weakness impacting her ability to perform ADL and self care tasks (See OT problem list below), however appears to be close to her baseline level. Pt lives at [place] and did not require assistance for functional mobility/ADL's prior to this admission, she plans to return when medically able. Pt should benefit from acute OT to assist in maximizing independence with ADL's prior to anticipated d/c back to ALF.    Follow Up Recommendations  No OT follow up;Supervision/Assistance - 24 hour(PRN Assistance at ALF)    Equipment Recommendations  None recommended by OT    Recommendations for Other Services       Precautions / Restrictions Precautions Precautions: Fall Restrictions Weight Bearing Restrictions: No      Mobility Bed Mobility Overal bed mobility: Needs Assistance Bed Mobility: Sidelying to Sit;Rolling;Sit to Supine Rolling: Modified independent (Device/Increase time) Sidelying to sit: Min guard   Sit to supine: Min guard      Transfers Overall transfer level: Needs assistance Equipment used: Rolling walker (2 wheeled) Transfers: Sit to/from [employer] Sit to Stand: Min guard Stand pivot transfers: Min guard            Balance Overall balance assessment: Needs assistance Sitting-balance support: No upper extremity supported;Feet supported Sitting balance-Leahy Scale: Good     Standing balance support: Single extremity supported;During functional activity Standing balance-Leahy Scale: Fair Standing balance comment: Pt uses rollator at ALF. Takes rest breaks PRN                           ADL either performed or assessed with clinical judgement   ADL Overall ADL's : Needs assistance/impaired Eating/Feeding: Independent;Bed level   Grooming: Standing;Supervision/safety   Upper Body Bathing: Set up;Sitting   Lower Body Bathing: Supervison/ safety;Sit to/from stand   Upper Body Dressing : Modified independent;Sitting   Lower Body Dressing: Sit to/from stand;Min guard   Toilet Transfer: RW;Ambulation;Supervision/safety(Simulated transfer bed sit to stand, several steps and repeat)   Toileting- Clothing Manipulation and Hygiene: Min guard;Sit to/from stand       Functional mobility during ADLs: Min guard;Rolling walker General ADL Comments: Pt and her niece were educated in role of OT followed by assessment and ADL's for toileting, LB dressing. Pt reports that she feels weak but is no longer experiencing any numbness in LUE "It only lasted about 31min" per pt report. She does exhibit some weakness in LUE but reports that this is near or at her baseline as she has arthritis and trigger fingers L hand. Pt plans to return to her ALF when cleared medically. Pt reported "I think I pee'd a little" She was assisted in washing up of her LE's was assisted onto bedpan at her request after  Evaluation today. RN was notified that pt colostomy was loose and needed to be changed prior to PT assessment.     Vision Baseline Vision/History: Wears glasses Patient Visual Report: No change from baseline       Perception      Praxis      Pertinent Vitals/Pain Pain Assessment: Faces Faces Pain Scale: Hurts a little bit Pain Location: Back Pain Descriptors / Indicators: Aching Pain Intervention(s): Limited activity within patient's tolerance;Repositioned;Monitored during session     Hand Dominance Right   Extremity/Trunk Assessment Upper Extremity Assessment Upper Extremity Assessment: Overall WFL for tasks assessed;Generalized weakness(End range shoulder flexion limited however pt is able to reach behind her head bilaterally. H/o arthritis in UE's, especially fingers. Trigger finger L IF/MF)   Lower Extremity Assessment Lower Extremity Assessment: Defer to PT evaluation       Communication Communication Communication: HOH(Wears bilateral hearing aids)   Cognition Arousal/Alertness: Awake/alert Behavior During Therapy: WFL for tasks assessed/performed Overall Cognitive Status: Within Functional Limits for tasks assessed                                     General Comments       Exercises     Shoulder Instructions      Home Living Family/patient expects to be discharged to:: Assisted living                             Home Equipment: Walker - 4 wheels          Prior Functioning/Environment Level of Independence: Independent with assistive device(s)        Comments: pt is from ALF; she doesn't have assistance for mobility/adls        OT Problem List: Decreased knowledge of use of DME or AE;Decreased strength;Decreased activity tolerance;Obesity      OT Treatment/Interventions: Self-care/ADL training;DME and/or AE instruction;Therapeutic activities;Patient/family education    OT Goals(Current goals can be found in the care plan section) Acute Rehab OT Goals Patient Stated Goal: Get results from testing. Return to ALF when medically able OT Goal Formulation: With patient Time For Goal Achievement: 05/15/17 Potential to Achieve Goals: Good  OT  Frequency: Min 2X/week   Barriers to D/C:            Co-evaluation              AM-PAC PT "6 Clicks" Daily Activity     Outcome Measure Help from another person eating meals?: None Help from another person taking care of personal grooming?: A Little Help from another person toileting, which includes using toliet, bedpan, or urinal?: A Little Help from another person bathing (including washing, rinsing, drying)?: A Little Help from another person to put on and taking off regular upper body clothing?: None Help from another person to put on and taking off regular lower body clothing?: A Little 6 Click Score: 20   End of Session Equipment Utilized During Treatment: Gait belt;Rolling walker Nurse Communication: Other (comment)(Pt colostomy bag is loose, requesting assistance to reapply prior to PT Eval)  Activity Tolerance: Patient tolerated treatment well Patient left: in bed;with call bell/phone within reach;with family/visitor present;Other (comment)(PT in room for Assessment)  OT Visit Diagnosis: Muscle weakness (generalized) (M62.81)                Time: 6213-0865 OT Time Calculation (min): 22 min Charges:  OT General Charges $OT Visit: 1 Visit OT Evaluation $OT Eval Low Complexity: 1 Low G-Codes:      [place], Amy Beth Dixon, OTR/L 05/01/2017, 10:23 AM

## 2017-05-01 NOTE — Progress Notes (Signed)
VASCULAR LAB PRELIMINARY  PRELIMINARY  PRELIMINARY  PRELIMINARY  Carotid duplex completed.    Preliminary report:  1-39% ICA stenosis.  Vertebral artery flow is antegrade.   Henok Heacock, RVT 05/01/2017, 2:24 PM

## 2017-05-01 NOTE — Clinical Social Work Note (Signed)
Clinical Social Work Assessment  Patient Details  Name: Makayla Fisher MRN: [id number] Date of Birth: May 23, 1922  Date of referral:  05/01/17               Reason for consult:  Facility Placement                Permission sought to share information with:  Facility Sport and exercise psychologist, Family Supports Permission granted to share information::  Yes, Verbal Permission Granted  Name::     Makayla Fisher  Agency::  [facility]  Relationship::  Niece, Therapist, art Information:     Housing/Transportation Living arrangements for the past 2 months:  [place] of Information:  Patient, Other (Comment Required)(Niece, Field seismologist) Patient Interpreter Needed:  None Criminal Activity/Legal Involvement Pertinent to Current Situation/Hospitalization:  No - Comment as needed Significant Relationships:  Other Family Members Lives with:  Self, Facility Resident Do you feel safe going back to the place where you live?  Yes Need for family participation in patient care:  No (Coment)  Care giving concerns:  Patient has been living at [facility] for the past few years and has no concerns about care received there.   Social Worker assessment / plan:  CSW met with patient and patient's family at bedside to discuss discharge plan. CSW discussed role in coordinating discharge and updating [facility] with any medical changes. CSW will continue to follow.  Employment status:  Retired Forensic scientist:  Medicare PT Recommendations:  Not assessed at this time Information / Referral to community resources:     Patient/Family's Response to care:  Patient and patient's family at bedside are agreeable to return to ALF, and are hopeful to return soon.  Patient/Family's Understanding of and Emotional Response to Diagnosis, Current Treatment, and Prognosis:  Patient and patient's family at bedside discussed how they really appreciated the care received at [facility] and were glad  the patient lived there. Patient and patient's family were appreciative of CSW assistance. Patient's niece discussed that she had asked the doctor to follow up on a medication, and asked for CSW to follow through to make sure it was done. Patient's niece and nephew indicated that they remembered the CSW helping them on a previous admission.  Emotional Assessment Appearance:  Appears stated age Attitude/Demeanor/Rapport:  Engaged Affect (typically observed):  Appropriate, Pleasant Orientation:  Oriented to Self, Oriented to Place, Oriented to  Time, Oriented to Situation Alcohol / Substance use:  Not Applicable Psych involvement (Current and /or in the community):  No (Comment)  Discharge Needs  Concerns to be addressed:  Care Coordination Readmission within the last 30 days:  No Current discharge risk:  None Barriers to Discharge:  Continued Medical Work up   [employer], [place] 05/01/2017, 12:33 PM

## 2017-05-01 NOTE — Progress Notes (Signed)
  Echocardiogram 2D Echocardiogram has been performed.  Makayla Fisher 05/01/2017, 4:22 PM

## 2017-05-01 NOTE — Progress Notes (Signed)
Discharge to: [facility] Anticipated discharge date: 05/01/17 Family notified: Yes, at bedside Transportation by: Family car  Report #: [phone], Ask for [place] signing off.  Laveda Abbe LCSW [phone]

## 2017-05-01 NOTE — Progress Notes (Signed)
NEUROHOSPITALISTS STROKE TEAM - DAILY PROGRESS NOTE   ADMISSION HISTORY: Makayla Fisher is an 84 y.o. female who presents to the ED after experiencing acute onset of left hand, arm and face numbness that resolved after 15 minutes. She states that currently, she is completely back to her baseline. Denies having been weak. No confusion or visual symptoms. She has no history of stroke. She takes ASA twice per week. She is not on a blood thinner  SUBJECTIVE (INTERVAL HISTORY)  Niece is at the bedside. Patient is found laying in bed in NAD. Overall she feels her condition is completely resolved. Voices no new complaints. No new events reported overnight.  Patient tells me her symptoms started in her left hand with numbness.  She then had left arm weakness.  Then experienced a left-sided headache and left neck pain.  All of these symptoms resolved after 15 minutes.  She states that nothing like this is ever happened before.  She did have a left-sided headache that the day before these symptoms occurred.  She has been experiencing a few days of hypertension.  She also complains of increasing weakness and always being tired over the past few months.   OBJECTIVE Lab Results: CBC:  Recent Labs  Lab 04/30/17 0649  WBC 7.4  HGB 12.3  HCT 38.3  MCV 94.1  PLT 153   BMP: Recent Labs  Lab 04/30/17 0649  NA 134*  K 4.0  CL 103  CO2 22  GLUCOSE 97  BUN 17  CREATININE 0.81  CALCIUM 9.4   Liver Function Tests:  Recent Labs  Lab 04/30/17 0649  AST 22  ALT 9*  ALKPHOS 64  BILITOT 0.7  PROT 6.0*  ALBUMIN 3.6   Thyroid Function Studies:  Recent Labs    05/01/17 1115  TSH 2.579   Coagulation Studies:  Recent Labs    04/30/17 0649  APTT 27  INR 0.96   [place] Work -Up:  Recent Labs    05/01/17 1115  VITAMINB12 315   PHYSICAL EXAM Temp:  [97.9 F (36.6 C)-98.7 F (37.1 C)] 98.2 F (36.8 C) (01/07 0929) Pulse Rate:   [61-70] 65 (01/07 0929) Resp:  [16-18] 18 (01/07 0929) BP: (95-164)/(24-62) 142/46 (01/07 1101) SpO2:  [93 %-96 %] 94 % (01/07 0929) General - Well nourished, well developed, in no apparent distress HEENT-  Normocephalic, Normal external eye/conjunctiva.  Normal external ears. Normal external nose, mucus membranes and septum.   Cardiovascular - Regular rate and rhythm  Respiratory - Lungs clear bilaterally. No wheezing. Abdomen - soft and non-tender, BS normal Extremities- no edema or cyanosis Mental Status -  Level of arousal and orientation to time, place, and person were intact. Language including expression, naming, repetition, comprehension was assessed and found intact. Attention span and concentration were normal Recent and remote memory were intact Fund of Knowledge was assessed and was intact Cranial Nerves II - XII - II - Visual field intact OU III, IV, VI - Extraocular movements intact. V - Facial sensation intact bilaterally. VII - Facial movement intact bilaterally VIII - Hearing & vestibular intact bilaterally X - Palate elevates symmetrically XI - Chin turning & shoulder shrug intact bilaterally. XII - Tongue protrusion intact Motor Strength - The patient's strength was normal in all extremities and pronator drift was absent.  Bulk was normal and fasciculations were absent  Motor Tone - Muscle tone was assessed at the neck and appendages and was normal Reflexes - The patient's reflexes were symmetrical in all  extremities and she had no pathological reflexes Sensory - Light touch was assessed and was symmetrical Coordination - The patient had normal movements in the hands and feet with no ataxia or dysmetria.  Tremor was absent Gait and Station - deferred.  IMAGING: I have personally reviewed the radiological images below and agree with the radiology interpretations.  MRI/MRA Brain Wo Contrast Result Date: 04/30/2017 IMPRESSION: Atrophy and small vessel disease.  No  acute intracranial findings. No intracranial flow reducing lesion is evident. Electronically Signed   By: Staci Righter M.D.   On: 04/30/2017 11:16   Ct Head Code Stroke Wo Contrast Result Date: 04/30/2017 IMPRESSION: 1. Generalized atrophy with moderate to extensive small vessel disease. No acute findings are evident 2. ASPECTS is 10. These results were communicated to Dr. Cheral Marker At 7:01 amon 1/6/2019by text page via the [facility] messaging system. Electronically Signed   By: Staci Righter M.D.   On: 04/30/2017 07:04   Echocardiogram:         PENDING                                        B/L Carotid U/S:    1-39% ICA stenosis.  Vertebral artery flow is antegrade.     IMPRESSION: Makayla Fisher is a 84 y.o. female with PMH of HTN, HLD, CHF and Hx of MI that presents to the hospital was acute onset of left hand, arm and face numbness that resolved after 15 minutes. MRI negative for acute findings.  Likely right brain subcortical TIA: Suspected Etiology: small vessel Resultant Symptoms:  left hand, arm and face numbness Stroke Risk Factors: hyperlipidemia and hypertension Other Stroke Risk Factors: Advanced age, CAD, Hx of MI, Migraines, CHF  Outstanding Stroke Work-up Studies: ECHO pending, remainder of workup completed  05/01/2017 ASSESSMENT:   Neuro exam stable and non-focal. MRI negative for acute findings. Continue daily ASA and statin. Avoid Hypotension and Accelerated HTN.  TSH - wnl, B12 borderline low. Will start PO replacement. BNP elevated with Hx of CHF. States she used to take Lasix but it was stopped at some point recently. Medicine to decide if appropriate to restart. Neurology follow up in 6 weeks.  PLAN  05/01/2017: Continue Aspirin/ Statin Frequent neuro checks Telemetry monitoring PT/OT/SLP Ongoing aggressive stroke risk factor management Patient counseled to be compliant with her antithrombotic medications Patient counseled on Lifestyle modifications including, Diet,  Exercise, and Stress Follow up with [facility] in 6 weeks  Borderline B12 deficiency PO replacement  Hx of CHF no longer on Lasix BNP elevated at 119.8 Close PCP/Cardiology outpatient follow up  HYPERTENSION: Stable Avoid hypotension, Accelerated HTN and dehydration Hydralazine discontinued Long term BP goal normotensive. May slowly restart home B/P medications  Home Meds: Lisinopril  HYPERLIPIDEMIA:    Component Value Date/Time   CHOL 189 05/01/2017 0520   TRIG 71 05/01/2017 0520   HDL 62 05/01/2017 0520   CHOLHDL 3.0 05/01/2017 0520   VLDL 14 05/01/2017 0520   LDLCALC 113 (H) 05/01/2017 0520  Home Meds:  NONE LDL  goal < 70 Started on Zocor 20 mg daily Continue statin at discharge  R/O DIABETES: Lab Results  Component Value Date   HGBA1C 5.2 05/01/2017  HgbA1c goal < 7.0  Other Active Problems: Principal Problem:   TIA (transient ischemic attack) Active Problems:   HLD (hyperlipidemia)   Hx of CABG x 3/stent  Bilateral carotid artery stenosis   Non Hodgkin's lymphoma (HCC)   Hypothyroidism   Hypertension   S/P colostomy (HCC)   Anxiety disorder   Abnormal urinalysis    Hospital day # 0 VTE prophylaxis: SCD's Diet : Diet Heart Room service appropriate? Yes; Fluid consistency: Thin   FAMILY UPDATES: family at bedside  TEAM UPDATES: Geradine Girt, DO   Prior Home Stroke Medications:  ASA twice weekly  Discharge Stroke Meds:  Please discharge patient on aspirin 81 mg daily   Disposition: 01-Home or Self Care Therapy Recs:               HOME Home Equipment:         TBD Follow Up:  Follow-up Information    Garvin Fila, MD. Schedule an appointment as soon as possible for a visit in 6 week(s).   Specialties:  Neurology, Radiology Contact information: [address] [place] [address] Glenn, Gates, DO -PCP Follow up in 1-2 weeks      Assessment & plan discussed with with attending  physician and they are in agreement.    Mary Sella, ANP-C Stroke Neurology Team 05/01/2017 2:09 PM I have personally examined this patient, reviewed notes, independently viewed imaging studies, participated in medical decision making and plan of care.ROS completed by me personally and pertinent positives fully documented  I have made any additions or clarifications directly to the above note. Agree with note above. Patient presented with recurrent left face arm and leg paresthesias likely from small vessel disease. I. Recommend aspirin 81 mg daily and statin. Continue ongoing stroke workup. Long discussion with the patient and niece at the bedside. Greater than 50% time during this 25 minute visit was spent on counseling and coordination of care about her TIA workup and answering questions discussed with Dr. Theresa Duty, MD Medical Director [place] Pager: [phone] 05/01/2017 3:19 PM  Neurology to sign-off after ECHO results reviewed. Please call with any further questions or concerns. Thank you for this consultation.  To contact Stroke Continuity provider, please refer to [email or web address] After hours, contact General Neurology

## 2017-05-01 NOTE — Evaluation (Signed)
Physical Therapy Evaluation Patient Details Name: Makayla Fisher MRN: [id number] DOB: 04/20/1923 Today's Date: 05/01/2017   History of Present Illness  84 y.o. female with medical history significant for hypertension, known nonobstructive bilateral carotid stenosis, CABG x3 and prior stent, dyslipidemia, hypothyroidism, anxiety disorder and non-Hodgkin's lymphoma in remission.  She was in her usual state of health and was up watching a movie around 3:00 this morning when she began having numbness in her left index finger that eventually radiated up the arm to about the elbow level but not to the shoulder.  Initial CT of the head was unremarkable MRI reveals Atrophy and small vessel disease.  No acute intracranial findings.  Clinical Impression  Pt admitted with above diagnosis. Pt currently with functional limitations due to the deficits listed below (see PT Problem List). PTA pt was able to ambulate 200 feet with Rollator to get to facility dining and limited driving. Pt limited in her mobility by today by her ostomy coming loose. Pt was min guard for bed mobility, transfers and ambulation of 75 feet with RW.  Pt will benefit from skilled PT to increase their independence and safety with mobility in the acute setting to prepare for ultimate discharge back to her ALF.       Follow Up Recommendations No PT follow up;Supervision/Assistance - 24 hour(intermittent supervision provided by ALF)    Equipment Recommendations  None recommended by PT       Precautions / Restrictions Precautions Precautions: Fall Restrictions Weight Bearing Restrictions: No      Mobility  Bed Mobility Overal bed mobility: Needs Assistance Bed Mobility: Supine to Sit Rolling: Modified independent (Device/Increase time) Sidelying to sit: Min guard Supine to sit: Supervision Sit to supine: Min guard   General bed mobility comments: supervision for safety   Transfers Overall transfer level: Needs  assistance Equipment used: Rolling walker (2 wheeled) Transfers: Sit to/from [facility] Sit to Stand: Min guard Stand pivot transfers: Min guard       General transfer comment: min guard for safety, good power up and steadying with RW  Ambulation/Gait Ambulation/Gait assistance: Min guard Ambulation Distance (Feet): 75 Feet Assistive device: Rolling walker (2 wheeled) Gait Pattern/deviations: Step-through pattern;Decreased step length - right;Decreased step length - left;Trunk flexed;Shuffle Gait velocity: slowed Gait velocity interpretation: Below normal speed for age/gender General Gait Details: hands on min guard for safety         Balance Overall balance assessment: Needs assistance Sitting-balance support: No upper extremity supported;Feet supported Sitting balance-Leahy Scale: Good     Standing balance support: Single extremity supported;During functional activity Standing balance-Leahy Scale: Fair Standing balance comment: able to steady with single UE assist                             Pertinent Vitals/Pain Pain Assessment: Faces Faces Pain Scale: Hurts a little bit Pain Location: Back Pain Descriptors / Indicators: Aching Pain Intervention(s): Limited activity within patient's tolerance;Monitored during session    Home Living Family/patient expects to be discharged to:: Assisted living               Home Equipment: Walker - 4 wheels      Prior Function Level of Independence: Independent with assistive device(s)         Comments: pt is from ALF; she doesn't have assistance for mobility/adls     Hand Dominance   Dominant Hand: Right    Extremity/Trunk Assessment   Upper  Extremity Assessment Upper Extremity Assessment: Defer to OT evaluation    Lower Extremity Assessment Lower Extremity Assessment: Overall WFL for tasks assessed    Cervical / Trunk Assessment Cervical / Trunk Assessment: Kyphotic   Communication   Communication: HOH(Wears bilateral hearing aids)  Cognition Arousal/Alertness: Awake/alert Behavior During Therapy: WFL for tasks assessed/performed Overall Cognitive Status: Within Functional Limits for tasks assessed                                        General Comments General comments (skin integrity, edema, etc.): pt niece and nephew-in law present throughout session    Exercises     Assessment/Plan    PT Assessment Patient needs continued PT services  PT Problem List Decreased mobility;Decreased activity tolerance       PT Treatment Interventions DME instruction;Gait training;Functional mobility training;Therapeutic activities;Therapeutic exercise;Balance training;Neuromuscular re-education;Cognitive remediation;Patient/family education    PT Goals (Current goals can be found in the Care Plan section)  Acute Rehab PT Goals Patient Stated Goal: Get results from testing. Return to ALF when medically able PT Goal Formulation: With patient Time For Goal Achievement: 05/15/17 Potential to Achieve Goals: Good    Frequency Min 2X/week    AM-PAC PT "6 Clicks" Daily Activity  Outcome Measure Difficulty turning over in bed (including adjusting bedclothes, sheets and blankets)?: A Little Difficulty moving from lying on back to sitting on the side of the bed? : A Little Difficulty sitting down on and standing up from a chair with arms (e.g., wheelchair, bedside commode, etc,.)?: Unable Help needed moving to and from a bed to chair (including a wheelchair)?: A Little Help needed walking in hospital room?: A Little Help needed climbing 3-5 steps with a railing? : A Lot 6 Click Score: 15    End of Session Equipment Utilized During Treatment: Gait belt Activity Tolerance: Patient tolerated treatment well Patient left: Other (comment)(pt left with nurse in bathroom as ostemy bag had come loose ) Nurse Communication: Mobility status PT Visit  Diagnosis: Other abnormalities of gait and mobility (R26.89);Difficulty in walking, not elsewhere classified (R26.2)    Time: 1255-1315 PT Time Calculation (min) (ACUTE ONLY): 20 min   Charges:   PT Evaluation $PT Eval Moderate Complexity: 1 Mod     PT G Codes:        Omarri Eich B. Migdalia Dk PT, DPT Acute Rehabilitation  [phone] Pager [phone]    [place] 05/01/2017, 1:57 PM

## 2017-05-01 NOTE — Progress Notes (Signed)
Late entry for 03/30/18: Patient stated her colostomy bag was leaking. It was loose, and leaking.  Removed colostomy bag and cleansed area with warm water. Stoma red, surrounding areas are pink and blanchable. Supplies are in room for two more changes. Patient comfortable. Bed alarm on . Safety maintained.

## 2017-05-01 NOTE — Discharge Instructions (Signed)
New Meds: daily ASA, recommend oral B12 replacement -- can buy OTC, and daily statin -- prescription given

## 2017-05-01 NOTE — Care Management Note (Signed)
Case Management Note  Patient Details  Name: Makayla Fisher MRN: [id number] Date of Birth: [birth year]  Subjective/Objective:                    Action/Plan: Pt discharging to [facility]. No further needs per CM.  Expected Discharge Date:  05/01/17               Expected Discharge Plan:  Assisted Living / Rest Home  In-House Referral:  Clinical Social Work  Discharge planning Services     Post Acute Care Choice:    Choice offered to:     DME Arranged:    DME Agency:     HH Arranged:    [place] Agency:     Status of Service:  Completed, signed off  If discussed at [employer], dates discussed:    Additional Comments:  Pollie Friar, RN 05/01/2017, 4:31 PM

## 2017-05-01 NOTE — Care Management Obs Status (Signed)
[place] NOTIFICATION   Patient Details  Name: Makayla Fisher MRN: [id number] Date of Birth: 1923/04/24   Medicare Observation Status Notification Given:  Yes    Pollie Friar, RN 05/01/2017, 3:03 PM

## 2017-05-01 NOTE — NC FL2 (Signed)
[place] LEVEL OF CARE SCREENING TOOL     IDENTIFICATION  Patient Name: Makayla Fisher Birthdate: 30-Oct-1922 Sex: female Admission Date (Current Location): 04/30/2017  [facility] and [place] Number:  Herbalist and Address:  [facility] [place], [place] [address], [place], [place] [address]      Provider Number: [id number]  Attending Physician Name and Address:  Geradine Girt, DO  Relative Name and Phone Number:       Current Level of Care: Hospital Recommended Level of Care: [place] Prior Approval Number:    Date Approved/Denied:   PASRR Number:    Discharge Plan: Other (Comment)(ALF)    Current Diagnoses: Patient Active Problem List   Diagnosis Date Noted  . TIA (transient ischemic attack) 04/30/2017  . HLD (hyperlipidemia) 04/30/2017  . Hx of CABG x 3/stent 04/30/2017  . Bilateral carotid artery stenosis 04/30/2017  . Non Hodgkin's lymphoma ([place]) 04/30/2017  . Hypothyroidism 04/30/2017  . Hypertension 04/30/2017  . S/P colostomy ([place]) 04/30/2017  . Anxiety disorder 04/30/2017  . Abnormal urinalysis 04/30/2017  . CAP (community acquired pneumonia) 12/13/2016  . Acute lower UTI 12/13/2016  . Glaucoma 12/13/2016  . Colostomy in place [facility]) 12/13/2016  . Vitamin D deficiency 10/20/2016  . Follicular lymphoma grade IIIa of intra-abdominal lymph nodes ([place]) 04/22/2016  . Iron deficiency anemia due to chronic blood loss 08/07/2014  . HTN (hypertension) 07/15/2014  . Unilateral carotid artery disease ([place]) 07/15/2014  . Bradycardia 07/15/2014  . 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 Preop cardiovascular exam 04/08/2014  . NHL (non-Hodgkin's lymphoma) ([place]) 10/18/2013  . 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[place]) 07/24/2013    Orientation RESPIRATION BLADDER Height & Weight     Self, Time, Situation, Place  Normal Continent Weight:   Height:     BEHAVIORAL SYMPTOMS/MOOD NEUROLOGICAL  BOWEL NUTRITION STATUS      Colostomy Diet(no added salt)  AMBULATORY STATUS COMMUNICATION OF NEEDS Skin   Limited Assist Verbally Normal                       Personal Care Assistance Level of Assistance  Bathing, Feeding, Dressing Bathing Assistance: Limited assistance Feeding assistance: Independent Dressing Assistance: Limited assistance     Functional Limitations Info  Sight, Hearing, Speech Sight Info: Adequate Hearing Info: Adequate Speech Info: Adequate    SPECIAL CARE FACTORS FREQUENCY                       Contractures Contractures Info: Not present    Additional Factors Info  Code Status, Allergies, Psychotropic Code Status Info: Full Allergies Info: Epipen Epinephrine, Heparin, Levaquin Levofloxacin In D5w, Chlorhexidine Gluconate, Sulfa Antibiotics Psychotropic Info: Zoloft 25mg  every morning         Current Medications (05/01/2017):  This is the current hospital active medication list Current Facility-Administered Medications  Medication Dose Route Frequency Provider Last Rate Last Dose  . acetaminophen (TYLENOL) tablet 650 mg  650 mg Oral Q4H PRN Samella Parr, NP       Or  . acetaminophen (TYLENOL) solution 650 mg  650 mg Per Tube Q4H PRN Samella Parr, NP       Or  . acetaminophen (TYLENOL) suppository 650 mg  650 mg Rectal Q4H PRN Samella Parr, NP      . aspirin EC tablet 81 mg  81 mg Oral Daily Costello, Mary A, NP   81 mg  at 05/01/17 1152  . calcium-vitamin D (OSCAL WITH D) 500-200 MG-UNIT per tablet 1 tablet  1 tablet Oral Q lunch Samella Parr, NP   1 tablet at 05/01/17 1152  . levothyroxine (SYNTHROID, LEVOTHROID) tablet 50 mcg  50 mcg Oral QAC breakfast Samella Parr, NP   50 mcg at 05/01/17 0906  . lisinopril (PRINIVIL,ZESTRIL) tablet 20 mg  20 mg Oral Daily Samella Parr, NP   20 mg at 05/01/17 0906  . loperamide (IMODIUM) capsule 2 mg  2 mg Oral Q4H PRN Samella Parr, NP      . LORazepam (ATIVAN) tablet 0.5 mg   0.5 mg Oral BID PRN Samella Parr, NP      . multivitamin with minerals tablet 1 tablet  1 tablet Oral Daily Samella Parr, NP   1 tablet at 05/01/17 0906  . olopatadine (PATANOL) 0.1 % ophthalmic solution 1 drop  1 drop Both Eyes BID Samella Parr, NP   1 drop at 05/01/17 0909  . rOPINIRole (REQUIP) tablet 1 mg  1 mg Oral QHS Samella Parr, NP   1 mg at 04/30/17 2258  . sertraline (ZOLOFT) tablet 25 mg  25 mg Oral q morning - 10a Samella Parr, NP   25 mg at 05/01/17 0907  . simvastatin (ZOCOR) tablet 20 mg  20 mg Oral q1800 Samella Parr, NP   20 mg at 04/30/17 1759  . tiotropium (SPIRIVA) inhalation capsule 18 mcg  18 mcg Inhalation Daily Samella Parr, NP   18 mcg at 05/01/17 1194     Discharge Medications: Please see discharge summary for a list of discharge medications.  Relevant Imaging Results:  Relevant Lab Results:   Additional Information SS#: [id number]  Geralynn Ochs, LCSW

## 2017-05-01 NOTE — Discharge Summary (Addendum)
Physician Discharge Summary  Makayla Fisher GBT:[id number] DOB: 1922/05/01 DOA: 04/30/2017  PCP: [name], DO  Admit date: 04/30/2017 Discharge date: 05/01/2017   Recommendations for Outpatient Follow-Up:   Echo results: - Left ventricle: The cavity size was normal. Systolic function was   normal. The estimated ejection fraction was in the range of 55%   to 60%. Wall motion was normal; there were no regional wall   motion abnormalities. Features are consistent with a pseudonormal   left ventricular filling pattern, with concomitant abnormal   relaxation and increased filling pressure (grade 2 diastolic   dysfunction). Doppler parameters are consistent with high  ventricular filling pressure.   Discharge Diagnosis:   Principal Problem:   TIA (transient ischemic attack) Active Problems:   HLD (hyperlipidemia)   Hx of CABG x 3/stent   Bilateral carotid artery stenosis   Non Hodgkin's lymphoma (HCC)   Hypothyroidism   Hypertension   S/P colostomy (HCC)   Anxiety disorder   Abnormal urinalysis   Discharge disposition:  ALF  Discharge Condition: Improved.  Diet recommendation: Low sodium, heart healthy  Wound care: None.   History of Present Illness:   Makayla Fisher is a 84 y.o. female with medical history significant for hypertension, known nonobstructive bilateral carotid stenosis, CABG x3 and prior stent, dyslipidemia, hypothyroidism, anxiety disorder and non-Hodgkin's lymphoma in remission.  She was in her usual state of health and was up watching a movie around 3:00 this morning when she began having numbness in her left index finger that eventually radiated up the arm to about the elbow level but not to the shoulder.  At the same time she noticed some numbness in the back part of her neck.  She did get up to walk around and felt like the left arm may be dangling but denied motor difficulty.  She notified the staff at assisted living and EMS was called and upon  arrival to the ER symptoms had essentially resolved.  Patient reports having a "trigger finger" of the index finger and suspect this may be the etiology to her symptoms.  Initial CT of the head was unremarkable.  Labs are unremarkable.  Neurology has evaluated the patient and is recommending TIA/stroke rule out observation [facility] Course by Problem:   TIA -suspected small vessel etiology -LDL> 70 so low dose statin started Echo reading above -carotid: <39% b/l -MRI negative for acute CVA -daily ASA instead of 2x weekly -seen by PT/OT- no follow up  Low normal B12 -encourage PO replacement     Medical Consultants:    Neuro   Discharge Exam:   Vitals:   05/01/17 1101 05/01/17 1500  BP: (!) 142/46 (!) 112/50  Pulse:  63  Resp:  19  Temp:  98.6 F (37 C)  SpO2:  96%   Vitals:   05/01/17 1053 05/01/17 1056 05/01/17 1101 05/01/17 1500  BP: (!) 138/37 (!) 130/24 (!) 142/46 (!) 112/50  Pulse:    63  Resp:    19  Temp:    98.6 F (37 C)  TempSrc:    Oral  SpO2:    96%    Gen:  NAD    The results of significant diagnostics from this hospitalization (including imaging, microbiology, ancillary and laboratory) are listed below for reference.     Procedures and Diagnostic Studies:   Mr Angiogram Head Wo Contrast  Result Date: 04/30/2017 CLINICAL DATA:  RIGHT finger and hand numbness, with headache. Previous stroke. EXAM:  MRI HEAD WITHOUT CONTRAST MRA HEAD WITHOUT CONTRAST TECHNIQUE: Multiplanar, multiecho pulse sequences of the brain and surrounding structures were obtained without intravenous contrast. Angiographic images of the head were obtained using MRA technique without contrast. COMPARISON:  CT head 04/30/2017. FINDINGS: MRI HEAD FINDINGS Brain: No evidence for acute infarction, hemorrhage, mass lesion, hydrocephalus, or extra-axial fluid. Generalized atrophy, not unexpected for age. Moderate to severe subcortical and periventricular T2 and FLAIR  hyperintensities, likely chronic microvascular ischemic change. Vascular: Flow voids are maintained throughout the carotid, basilar, and vertebral arteries. There are no areas of chronic hemorrhage. Skull and upper cervical spine: Unremarkable visualized calvarium, skullbase, and cervical vertebrae. Pituitary, pineal, cerebellar tonsils unremarkable. No upper cervical cord lesions. Sinuses/Orbits: No orbital masses or proptosis. Globes appear symmetric. Sinuses appear well aerated, without evidence for air-fluid level. Other: Trace RIGHT mastoid effusion.  Unremarkable nasopharynx. MRA HEAD FINDINGS The RIGHT internal carotid artery is widely patent throughout its skull base, cavernous, and supraclinoid segments. There is an estimated 50% stenosis at the junction of the vertical petrous and inferior cavernous LEFT ICA, non flow reducing. Remainder of the LICA vessel is widely patent. Both middle cerebral arteries demonstrate wide patency without M1, M2 or M3 stenosis or occlusion. Minor irregularity both A1 anterior cerebral arteries, codominant. Fetal origin RIGHT PCA. Basilar artery widely patent with vertebrals codominant. No cerebellar branch occlusion. No saccular aneurysm. IMPRESSION: Atrophy and small vessel disease.  No acute intracranial findings. No intracranial flow reducing lesion is evident. Electronically Signed   By: Staci Righter M.D.   On: 04/30/2017 11:16   Mr Brain Wo Contrast  Result Date: 04/30/2017 CLINICAL DATA:  RIGHT finger and hand numbness, with headache. Previous stroke. EXAM: MRI HEAD WITHOUT CONTRAST MRA HEAD WITHOUT CONTRAST TECHNIQUE: Multiplanar, multiecho pulse sequences of the brain and surrounding structures were obtained without intravenous contrast. Angiographic images of the head were obtained using MRA technique without contrast. COMPARISON:  CT head 04/30/2017. FINDINGS: MRI HEAD FINDINGS Brain: No evidence for acute infarction, hemorrhage, mass lesion, hydrocephalus, or  extra-axial fluid. Generalized atrophy, not unexpected for age. Moderate to severe subcortical and periventricular T2 and FLAIR hyperintensities, likely chronic microvascular ischemic change. Vascular: Flow voids are maintained throughout the carotid, basilar, and vertebral arteries. There are no areas of chronic hemorrhage. Skull and upper cervical spine: Unremarkable visualized calvarium, skullbase, and cervical vertebrae. Pituitary, pineal, cerebellar tonsils unremarkable. No upper cervical cord lesions. Sinuses/Orbits: No orbital masses or proptosis. Globes appear symmetric. Sinuses appear well aerated, without evidence for air-fluid level. Other: Trace RIGHT mastoid effusion.  Unremarkable nasopharynx. MRA HEAD FINDINGS The RIGHT internal carotid artery is widely patent throughout its skull base, cavernous, and supraclinoid segments. There is an estimated 50% stenosis at the junction of the vertical petrous and inferior cavernous LEFT ICA, non flow reducing. Remainder of the LICA vessel is widely patent. Both middle cerebral arteries demonstrate wide patency without M1, M2 or M3 stenosis or occlusion. Minor irregularity both A1 anterior cerebral arteries, codominant. Fetal origin RIGHT PCA. Basilar artery widely patent with vertebrals codominant. No cerebellar branch occlusion. No saccular aneurysm. IMPRESSION: Atrophy and small vessel disease.  No acute intracranial findings. No intracranial flow reducing lesion is evident. Electronically Signed   By: Staci Righter M.D.   On: 04/30/2017 11:16   Ct Head Code Stroke Wo Contrast  Result Date: 04/30/2017 CLINICAL DATA:  Code stroke. Head heaviness. LEFT-sided sensory symptoms. EXAM: CT HEAD WITHOUT CONTRAST TECHNIQUE: Contiguous axial images were obtained from the base of the skull through the  vertex without intravenous contrast. COMPARISON:  09/19/2013. FINDINGS: Brain: No evidence for acute infarction, hemorrhage, mass lesion, hydrocephalus, or extra-axial  fluid. Generalized atrophy, not unexpected for age. Hypoattenuation of white matter, consistent with small vessel disease, appears moderate to advanced. Vascular: Calcification of the cavernous internal carotid arteries and distal vertebral arteries consistent with cerebrovascular atherosclerotic disease. No signs of intracranial large vessel occlusion. Skull: Normal. Negative for fracture or focal lesion. Sinuses/Orbits: No acute finding.  BILATERAL cataract extraction. Other: None. Compared with 2015, atrophic change in the brain has progressed. ASPECTS [facility] Stroke Program Early CT Score) - Ganglionic level infarction (caudate, lentiform nuclei, internal capsule, insula, M1-M3 cortex): 7 - Supraganglionic infarction (M4-M6 cortex): 3 Total score (0-10 with 10 being normal): 10 IMPRESSION: 1. Generalized atrophy with moderate to extensive small vessel disease. No acute findings are evident 2. ASPECTS is 10. These results were communicated to Dr. Cheral Marker At 7:01 amon 1/6/2019by text page via the [facility] messaging system. Electronically Signed   By: Staci Righter M.D.   On: 04/30/2017 07:04     Labs:   Basic Metabolic Panel: Recent Labs  Lab 04/30/17 0649  NA 134*  K 4.0  CL 103  CO2 22  GLUCOSE 97  BUN 17  CREATININE 0.81  CALCIUM 9.4   GFR Estimated Creatinine Clearance: 40 mL/min (by C-G formula based on SCr of 0.81 mg/dL). Liver Function Tests: Recent Labs  Lab 04/30/17 0649  AST 22  ALT 9*  ALKPHOS 64  BILITOT 0.7  PROT 6.0*  ALBUMIN 3.6   No results for input(s): LIPASE, AMYLASE in the last 168 hours. No results for input(s): AMMONIA in the last 168 hours. Coagulation profile Recent Labs  Lab 04/30/17 0649  INR 0.96    CBC: Recent Labs  Lab 04/30/17 0649  WBC 7.4  NEUTROABS 4.5  HGB 12.3  HCT 38.3  MCV 94.1  PLT 153   Cardiac Enzymes: No results for input(s): CKTOTAL, CKMB, CKMBINDEX, TROPONINI in the last 168 hours. BNP: Invalid input(s):  POCBNP CBG: No results for input(s): GLUCAP in the last 168 hours. D-Dimer No results for input(s): DDIMER in the last 72 hours. Hgb A1c Recent Labs    05/01/17 0520  HGBA1C 5.2   Lipid Profile Recent Labs    05/01/17 0520  CHOL 189  HDL 62  LDLCALC 113*  TRIG 71  CHOLHDL 3.0   Thyroid function studies Recent Labs    05/01/17 1115  TSH 2.579   Anemia work up Recent Labs    05/01/17 1115  [place]   Microbiology Recent Results (from the past 240 hour(s))  Culture, Urine     Status: None   Collection Time: 04/30/17  7:23 AM  Result Value Ref Range Status   Specimen Description URINE, RANDOM  Final   Special Requests NONE  Final   Culture NO GROWTH  Final   Report Status 05/01/2017 FINAL  Final     Discharge Instructions:   Discharge Instructions    Ambulatory referral to Neurology   Complete by:  As directed    An appointment is requested in approximately: 6 weeks Follow up with stroke clinic (Dr Leonie Man preferred, if not available, then consider Caesar Chestnut, [facility] or Jaynee Eagles whoever is available) at [facility] in about 6-8 weeks. Thanks.   Diet - low sodium heart healthy   Complete by:  As directed    Increase activity slowly   Complete by:  As directed      Allergies as of 05/01/2017  Reactions   Epipen [epinephrine]    Heart palpitations   Heparin    Unsure of side affects (maybe affected platelets per pt)   Levaquin [levofloxacin In D5w] Other (See Comments)   Weakness   Chlorhexidine Gluconate Other (See Comments)   Pt declined use and suspects she may have an intolerance,    Sulfa Antibiotics Rash      Medication List    TAKE these medications   acetaminophen 500 MG tablet Commonly known as:  TYLENOL Take 500 mg by mouth 2 (two) times daily.   aspirin 81 MG EC tablet Take 1 tablet (81 mg total) by mouth daily. Start taking on:  05/02/2017 What changed:    when to take this  additional instructions   CALCIUM 600+D 600-400 MG-UNIT  tablet Generic drug:  Calcium Carbonate-Vitamin D Take 1 tablet by mouth daily.   Cranberry 425 MG Caps Take 425 mg by mouth daily.   levothyroxine 50 MCG tablet Commonly known as:  SYNTHROID, LEVOTHROID Take 50 mcg by mouth every morning.   lisinopril 20 MG tablet Commonly known as:  PRINIVIL,ZESTRIL Take 20 mg by mouth daily.   loperamide 2 MG capsule Commonly known as:  IMODIUM Take 1 capsule (2 mg total) by mouth every 4 (four) hours as needed for diarrhea or loose stools. 1 tablet with every unformed stool up to 6 doses per day   LORazepam 0.5 MG tablet Commonly known as:  ATIVAN Take 1 tablet (0.5 mg total) by mouth 2 (two) times daily as needed for anxiety.   multivitamin with minerals Tabs tablet Take 1 tablet by mouth daily.   olopatadine 0.1 % ophthalmic solution Commonly known as:  PATANOL Place 1 drop into both eyes 2 (two) times daily.   psyllium 58.6 % packet Commonly known as:  METAMUCIL Take 1 packet by mouth 3 (three) times a week.   RA VISION-VITE PRESERVE PO Take 1 tablet by mouth 2 (two) times daily.   rOPINIRole 1 MG tablet Commonly known as:  REQUIP Take 1 mg by mouth at bedtime.   sertraline 25 MG tablet Commonly known as:  ZOLOFT Take 25 mg by mouth every morning.   simvastatin 20 MG tablet Commonly known as:  ZOCOR Take 1 tablet (20 mg total) by mouth daily at 6 PM.   SPIRIVA HANDIHALER 18 MCG inhalation capsule Generic drug:  tiotropium Place 18 mcg into inhaler and inhale daily.       Contact information for follow-up providers    Garvin Fila, MD. Schedule an appointment as soon as possible for a visit in 6 week(s).   Specialties:  Neurology, Radiology Contact information: [address] [address] [phone]            Contact information for after-discharge care    Destination    [facility] .   Service:  Group Home Contact information: [place] [phone]                   Time coordinating discharge: 35 min  Signed:  Geradine Girt   [facility] 05/01/2017, 3:30 PM

## 2017-05-01 NOTE — Plan of Care (Signed)
  Education: Knowledge of General Education information will improve 05/01/2017 1616 - Completed/Met by Ames Dura, RN   Health Behavior/Discharge Planning: Ability to manage health-related needs will improve 05/01/2017 1616 - Completed/Met by Ames Dura, RN   Clinical Measurements: Ability to maintain clinical measurements within normal limits will improve 05/01/2017 1616 - Completed/Met by Ames Dura, RN Will remain free from infection 05/01/2017 1616 - Completed/Met by Ames Dura, RN Diagnostic test results will improve 05/01/2017 1616 - Completed/Met by Ames Dura, RN Respiratory complications will improve 05/01/2017 1616 - Completed/Met by Ames Dura, RN Cardiovascular complication will be avoided 05/01/2017 1616 - Completed/Met by Ames Dura, RN   Activity: Risk for activity intolerance will decrease 05/01/2017 1616 - Completed/Met by Ames Dura, RN   Nutrition: Adequate nutrition will be maintained 05/01/2017 1616 - Completed/Met by Ames Dura, RN   Coping: Level of anxiety will decrease 05/01/2017 1616 - Completed/Met by Ames Dura, RN   Elimination: Will not experience complications related to bowel motility 05/01/2017 1616 - Completed/Met by Ames Dura, RN Will not experience complications related to urinary retention 05/01/2017 1616 - Completed/Met by Ames Dura, RN   Pain Managment: General experience of comfort will improve 05/01/2017 1616 - Completed/Met by Ames Dura, RN   Safety: Ability to remain free from injury will improve 05/01/2017 1616 - Completed/Met by Ames Dura, RN   Skin Integrity: Risk for impaired skin integrity will decrease 05/01/2017 1616 - Completed/Met by Ames Dura, RN   Education: Knowledge of disease or condition will improve 05/01/2017 1616 - Completed/Met by Ames Dura, RN Knowledge of secondary prevention will improve 05/01/2017  1616 - Completed/Met by Ames Dura, RN Knowledge of patient specific risk factors addressed and post discharge goals established will improve 05/01/2017 1616 - Completed/Met by Ames Dura, RN   Coping: Will verbalize positive feelings about self 05/01/2017 1616 - Completed/Met by Ames Dura, RN Will identify appropriate support needs 05/01/2017 1616 - Completed/Met by Ames Dura, RN   Health Behavior/Discharge Planning: Ability to manage health-related needs will improve 05/01/2017 1616 - Completed/Met by Ames Dura, RN   Self-Care: Ability to participate in self-care as condition permits will improve 05/01/2017 1616 - Completed/Met by Ames Dura, RN Verbalization of feelings and concerns over difficulty with self-care will improve 05/01/2017 1616 - Completed/Met by Ames Dura, RN Ability to communicate needs accurately will improve 05/01/2017 1616 - Completed/Met by Ames Dura, RN   Nutrition: Risk of aspiration will decrease 05/01/2017 1616 - Completed/Met by Ames Dura, RN Dietary intake will improve 05/01/2017 1616 - Completed/Met by Ames Dura, RN   Ischemic Stroke/TIA Tissue Perfusion: Complications of ischemic stroke/TIA will be minimized 05/01/2017 1616 - Completed/Met by Ames Dura, RN   Ischemic Stroke/TIA Tissue Perfusion: Complications of ischemic stroke/TIA will be minimized 05/01/2017 1616 - Completed/Met by Julieanne Cotton A, RN   Spontaneous Subarachnoid Hemorrhage Tissue Perfusion: Complications of Spontaneous Subarachnoid Hemorrhage will be minimized 05/01/2017 1616 - Completed/Met by Ames Dura, RN

## 2017-05-01 NOTE — Progress Notes (Signed)
Patient discharged to assisted living with niece. Patient's niece called Assisted living facility and let them know patient was returning tonight. Lipitor was given, AVS printed and signed. Patient taken down to discharge lobby via wheelchair.

## 2017-05-03 ENCOUNTER — Inpatient Hospital Stay: Payer: Medicare Other

## 2017-05-03 ENCOUNTER — Inpatient Hospital Stay: Payer: Medicare Other | Attending: Hematology & Oncology | Admitting: Hematology & Oncology

## 2017-05-03 ENCOUNTER — Other Ambulatory Visit: Payer: Self-pay

## 2017-05-03 VITALS — BP 139/54 | HR 72 | Temp 98.1°F | Resp 16 | Wt 161.0 lb

## 2017-05-03 DIAGNOSIS — C8253 Diffuse follicle center lymphoma, intra-abdominal lymph nodes: Secondary | ICD-10-CM

## 2017-05-03 DIAGNOSIS — Z7982 Long term (current) use of aspirin: Secondary | ICD-10-CM | POA: Diagnosis not present

## 2017-05-03 DIAGNOSIS — E559 Vitamin D deficiency, unspecified: Secondary | ICD-10-CM

## 2017-05-03 DIAGNOSIS — Z95828 Presence of other vascular implants and grafts: Secondary | ICD-10-CM

## 2017-05-03 DIAGNOSIS — C8233 Follicular lymphoma grade IIIa, intra-abdominal lymph nodes: Secondary | ICD-10-CM | POA: Diagnosis present

## 2017-05-03 DIAGNOSIS — D500 Iron deficiency anemia secondary to blood loss (chronic): Secondary | ICD-10-CM | POA: Diagnosis not present

## 2017-05-03 LAB — CBC WITH DIFFERENTIAL (CANCER CENTER ONLY)
Abs Granulocyte: 3.6 10*3/uL (ref 1.5–6.5)
Basophils Absolute: 0 10*3/uL (ref 0.0–0.1)
Basophils Relative: 1 %
EOS ABS: 0.1 10*3/uL (ref 0.0–0.5)
EOS PCT: 1 %
HCT: 39.7 % (ref 34.8–46.6)
Hemoglobin: 13.3 g/dL (ref 11.6–15.9)
LYMPHS ABS: 1.4 10*3/uL (ref 0.9–3.3)
LYMPHS PCT: 24 %
MCH: 31.7 pg (ref 26.0–34.0)
MCHC: 33.5 g/dL (ref 32.0–36.0)
MCV: 94.5 fL (ref 81.0–101.0)
MONOS PCT: 14 %
Monocytes Absolute: 0.8 10*3/uL (ref 0.1–0.9)
NEUTROS PCT: 60 %
Neutro Abs: 3.6 10*3/uL (ref 1.5–6.5)
PLATELETS: 147 10*3/uL (ref 145–400)
RBC: 4.2 MIL/uL (ref 3.70–5.32)
RDW: 14.7 % (ref 11.1–15.7)
WBC Count: 5.8 10*3/uL (ref 4.0–10.3)

## 2017-05-03 LAB — CMP (CANCER CENTER ONLY)
ALK PHOS: 62 U/L (ref 40–150)
ALT: 6 U/L (ref 0–55)
ANION GAP: 8 (ref 5–15)
AST: 21 U/L (ref 5–34)
Albumin: 3.9 g/dL (ref 3.5–5.0)
BUN: 19 mg/dL (ref 7–26)
CO2: 24 mmol/L (ref 22–29)
CREATININE: 0.89 mg/dL (ref 0.70–1.30)
Calcium: 9.5 mg/dL (ref 8.4–10.4)
Chloride: 103 mmol/L (ref 98–109)
GFR, EST NON AFRICAN AMERICAN: 55 mL/min — AB (ref >60)
Glucose, Bld: 93 mg/dL (ref 70–140)
Potassium: 4.1 mmol/L (ref 3.3–4.7)
Sodium: 135 mmol/L — ABNORMAL LOW (ref 136–145)
Total Bilirubin: 0.6 mg/dL (ref 0.2–1.2)
Total Protein: 6.1 g/dL — ABNORMAL LOW (ref 6.4–8.3)

## 2017-05-03 LAB — LACTATE DEHYDROGENASE: LDH: 152 U/L (ref 98–192)

## 2017-05-03 MED ORDER — HEPARIN SOD (PORK) LOCK FLUSH 100 UNIT/ML IV SOLN
500.0000 [IU] | Freq: Once | INTRAVENOUS | Status: AC
  Administered 2017-05-03: 500 [IU] via INTRAVENOUS
  Filled 2017-05-03: qty 5

## 2017-05-03 MED ORDER — SODIUM CHLORIDE 0.9% FLUSH
10.0000 mL | INTRAVENOUS | Status: DC | PRN
  Administered 2017-05-03: 10 mL via INTRAVENOUS
  Filled 2017-05-03: qty 10

## 2017-05-03 NOTE — Progress Notes (Signed)
Hematology and Oncology Follow Up Visit  Makayla Fisher [id number] [birth year] 84 y.o. 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 is back for followup.  Unfortunately, it sounds like she had a TIA recently.  She was over at [facility].  She actually was discharged on January 7.  She had extensive workup.  Everything came back negative.  She now is on aspirin 81 mg daily.  She is very worried about having a stroke.  I try to tell her that she is doing all that she needs to do.  She is taking Zocor.  She is watching her blood pressure.  I did tell her to drink a lot of water.  She has had no problems with respect to recurrent non-Hodgkin's lymphoma.  She completed her maintenance Rituxan in June 2018.  There is been no problems with nausea or vomiting.  She has a colostomy.  This is been working okay.  She has had no cough.  There is no shortness of breath.  She has had no fever.  She had no rashes.  She has had no leg swelling.  We have given her iron intermittently.  Her last iron studies back in October looked okay.     Overall, her performance status is ECOG 1-2.  Medications:  Current Outpatient Medications:  .  acetaminophen (TYLENOL) 500 MG tablet, Take 500 mg by mouth 2 (two) times daily., Disp: , Rfl:  .  aspirin EC 81 MG EC tablet, Take 1 tablet (81 mg total) by mouth daily., Disp: , Rfl:  .  Calcium Carbonate-Vitamin D (CALCIUM 600+D) 600-400 MG-UNIT tablet, Take 1 tablet by mouth daily., Disp: , Rfl:  .  Cranberry 425 MG CAPS, Take 425 mg by mouth daily. , Disp: , Rfl:  .  levothyroxine (SYNTHROID, LEVOTHROID) 50 MCG tablet, Take 50 mcg by mouth every morning., Disp: , Rfl:  .  lisinopril (PRINIVIL,ZESTRIL) 20 MG tablet, Take  20 mg by mouth daily., Disp: , Rfl:  .  loperamide (IMODIUM) 2 MG capsule, Take 1 capsule (2 mg total) by mouth every 4 (four) hours as needed for diarrhea or loose stools. 1 tablet with every unformed stool up to 6 doses per day, Disp: 20 capsule, Rfl: 0 .  LORazepam (ATIVAN) 0.5 MG tablet, Take 1 tablet (0.5 mg total) by mouth 2 (two) times daily as needed for anxiety., Disp: 10 tablet, Rfl: 0 .  Multiple Vitamin (MULTIVITAMIN WITH MINERALS) TABS tablet, Take 1 tablet by mouth daily., Disp: , Rfl:  .  Multiple Vitamins-Minerals (RA VISION-VITE PRESERVE PO), Take 1 tablet by mouth 2 (two) times daily. , Disp: , Rfl:  .  olopatadine (PATANOL) 0.1 % ophthalmic solution, Place 1 drop into both eyes 2 (two) times daily., Disp: , Rfl:  .  psyllium (METAMUCIL) 58.6 % packet, Take 1 packet by mouth 3 (three) times a week., Disp: , Rfl:  .  rOPINIRole (REQUIP) 1 MG tablet, Take 1 mg by mouth at bedtime., Disp: , Rfl:  .  sertraline (ZOLOFT) 25 MG tablet, Take 25 mg by mouth every morning. , Disp: , Rfl:  .  simvastatin (ZOCOR) 20 MG tablet, Take 1 tablet (20 mg total) by mouth daily at 6 PM., Disp: 30 tablet, Rfl: 0 .  SPIRIVA HANDIHALER  18 MCG inhalation capsule, Place 18 mcg into inhaler and inhale daily. , Disp: , Rfl:  No current facility-administered medications for this visit.   Facility-Administered Medications Ordered in Other Visits:  .  sodium chloride flush (NS) 0.9 % injection 10 mL, 10 mL, Intravenous, PRN, [name], MD, 10 mL at 05/03/17 1515  Allergies:  Allergies  Allergen Reactions  . Epipen [Epinephrine]     Heart palpitations  . Heparin     Unsure of side affects (maybe affected platelets per pt)  . Levaquin [Levofloxacin In D5w] Other (See Comments)    Weakness  . Chlorhexidine Gluconate Other (See Comments)    Pt declined use and suspects she may have an intolerance,   . Sulfa Antibiotics Rash    Past Medical History, Surgical history, Social history, and Family  History were reviewed and updated.  Review of Systems: As stated in the interim history  Physical Exam:  weight is 161 lb (73 kg). Her oral temperature is 98.1 F (36.7 C). Her blood pressure is 139/54 (abnormal) and her pulse is 72. Her respiration is 16 and oxygen saturation is 97%.   Physical Exam  Constitutional: She is oriented to person, place, and time.  HENT:  Head: Normocephalic and atraumatic.  Mouth/Throat: Oropharynx is clear and moist.  Eyes: EOM are normal. Pupils are equal, round, and reactive to light.  Neck: Normal range of motion.  Cardiovascular: Normal rate, regular rhythm and normal heart sounds.  Pulmonary/Chest: Effort normal and breath sounds normal.  Abdominal: Soft. Bowel sounds are normal.  Musculoskeletal: Normal range of motion. She exhibits no edema, tenderness or deformity.  Lymphadenopathy:    She has no cervical adenopathy.  Neurological: She is alert and oriented to person, place, and time.  Skin: Skin is warm and dry. No rash noted. No erythema.  Psychiatric: She has a normal mood and affect. Her behavior is normal. Judgment and thought content normal.  Vitals reviewed.    Lab Results  Component Value Date   WBC 7.4 04/30/2017   HGB 12.3 04/30/2017   HCT 39.7 05/03/2017   MCV 94.5 05/03/2017   PLT 153 04/30/2017     Chemistry      Component Value Date/Time   NA 134 (L) 04/30/2017 0649   NA 143 01/31/2017 0931   NA 141 05/08/2014 1041   K 4.0 04/30/2017 0649   K 3.7 01/31/2017 0931   K 4.5 05/08/2014 1041   CL 103 04/30/2017 0649   CL 106 01/31/2017 0931   CO2 22 04/30/2017 0649   CO2 27 01/31/2017 0931   CO2 24 05/08/2014 1041   BUN 17 04/30/2017 0649   BUN 10 01/31/2017 0931   BUN 14.2 05/08/2014 1041   CREATININE 0.81 04/30/2017 0649   CREATININE 0.9 01/31/2017 0931   CREATININE 0.8 05/08/2014 1041      Component Value Date/Time   CALCIUM 9.4 04/30/2017 0649   CALCIUM 9.7 01/31/2017 0931   CALCIUM 9.2 05/08/2014 1041    ALKPHOS 64 04/30/2017 0649   ALKPHOS 57 01/31/2017 0931   ALKPHOS 66 05/08/2014 1041   AST 22 04/30/2017 0649   AST 26 01/31/2017 0931   AST 23 05/08/2014 1041   ALT 9 (L) 04/30/2017 0649   ALT 14 01/31/2017 0931   ALT 10 05/08/2014 1041   BILITOT 0.7 04/30/2017 0649   BILITOT 0.50 01/31/2017 0931   BILITOT 0.42 05/08/2014 1041      Impression and Plan: Makayla Fisher is 84 year old female. She has  a follicular large cell lymphoma. I do not see any evidence of recurrent disease. I do not believe that she is at significant risk for occurrence. Her labs look really good.  Hopefully, she will not have any issues with cerebrovascular disease.  We will plan to get her back in 4 months.  She has a Port-A-Cath.  We will continue to flush the Port-A-Cath.  Her niece came with her.  She was comes with her.  I see her in church every Sunday.    [name], MD 1/9/20194:13 PM

## 2017-05-03 NOTE — Patient Instructions (Signed)
Implanted Port Home Guide An implanted port is a type of central line that is placed under the skin. Central lines are used to provide IV access when treatment or nutrition needs to be given through a person's veins. Implanted ports are used for long-term IV access. An implanted port may be placed because:  You need IV medicine that would be irritating to the small veins in your hands or arms.  You need long-term IV medicines, such as antibiotics.  You need IV nutrition for a long period.  You need frequent blood draws for lab tests.  You need dialysis.  Implanted ports are usually placed in the chest area, but they can also be placed in the upper arm, the abdomen, or the leg. An implanted port has two main parts:  Reservoir. The reservoir is round and will appear as a small, raised area under your skin. The reservoir is the part where a needle is inserted to give medicines or draw blood.  Catheter. The catheter is a thin, flexible tube that extends from the reservoir. The catheter is placed into a large vein. Medicine that is inserted into the reservoir goes into the catheter and then into the vein.  How will I care for my incision site? Do not get the incision site wet. Bathe or shower as directed by your health care provider. How is my port accessed? Special steps must be taken to access the port:  Before the port is accessed, a numbing cream can be placed on the skin. This helps numb the skin over the port site.  Your health care provider uses a sterile technique to access the port. ? Your health care provider must put on a mask and sterile gloves. ? The skin over your port is cleaned carefully with an antiseptic and allowed to dry. ? The port is gently pinched between sterile gloves, and a needle is inserted into the port.  Only "non-coring" port needles should be used to access the port. Once the port is accessed, a blood return should be checked. This helps ensure that the port  is in the vein and is not clogged.  If your port needs to remain accessed for a constant infusion, a clear (transparent) bandage will be placed over the needle site. The bandage and needle will need to be changed every week, or as directed by your health care provider.  Keep the bandage covering the needle clean and dry. Do not get it wet. Follow your health care provider's instructions on how to take a shower or bath while the port is accessed.  If your port does not need to stay accessed, no bandage is needed over the port.  What is flushing? Flushing helps keep the port from getting clogged. Follow your health care provider's instructions on how and when to flush the port. Ports are usually flushed with saline solution or a medicine called heparin. The need for flushing will depend on how the port is used.  If the port is used for intermittent medicines or blood draws, the port will need to be flushed: ? After medicines have been given. ? After blood has been drawn. ? As part of routine maintenance.  If a constant infusion is running, the port may not need to be flushed.  How long will my port stay implanted? The port can stay in for as long as your health care provider thinks it is needed. When it is time for the port to come out, surgery will be   done to remove it. The procedure is similar to the one performed when the port was put in. When should I seek immediate medical care? When you have an implanted port, you should seek immediate medical care if:  You notice a bad smell coming from the incision site.  You have swelling, redness, or drainage at the incision site.  You have more swelling or pain at the port site or the surrounding area.  You have a fever that is not controlled with medicine.  This information is not intended to replace advice given to you by your health care provider. Make sure you discuss any questions you have with your health care provider. Document  Released: 04/11/2005 Document Revised: 09/17/2015 Document Reviewed: 12/17/2012 Elsevier Interactive Patient Education  2017 Elsevier Inc.  

## 2017-05-04 ENCOUNTER — Telehealth: Payer: Self-pay | Admitting: *Deleted

## 2017-05-04 LAB — IRON AND TIBC
IRON: 67 ug/dL (ref 41–142)
SATURATION RATIOS: 24 % (ref 21–57)
TIBC: 278 ug/dL (ref 236–444)
UIBC: 211 ug/dL

## 2017-05-04 LAB — VITAMIN D 25 HYDROXY (VIT D DEFICIENCY, FRACTURES): Vit D, 25-Hydroxy: 31 ng/mL (ref 30.0–100.0)

## 2017-05-04 LAB — FERRITIN: Ferritin: 77 ng/mL (ref 9–269)

## 2017-05-04 NOTE — Telephone Encounter (Signed)
-----   Message from [name], MD sent at 05/04/2017  8:11 AM EST ----- Call- the labs and K+ are good!  pete

## 2017-05-08 ENCOUNTER — Telehealth: Payer: Self-pay | Admitting: Cardiovascular Disease

## 2017-05-08 NOTE — Telephone Encounter (Signed)
Will forward to Dr. Johnsie Cancel to see if medication changes can be made.

## 2017-05-08 NOTE — Telephone Encounter (Signed)
New message  Please call  Pt c/o medication issue:  1. Name of Medication: simvastatin (ZOCOR) 20 MG tablet  2. How are you currently taking this medication (dosage and times per day)? As prescribed  3. Are you having a reaction (difficulty breathing--STAT)? No  4. What is your medication issue? Patient  Wants to know if medication can cause dementia

## 2017-05-08 NOTE — Telephone Encounter (Signed)
Simvastatin and atorvastatin seem to be more associated with memory issues than rosuvastatin. Could consider change to rosuvastatin 10mg  daily.

## 2017-05-09 MED ORDER — ROSUVASTATIN CALCIUM 5 MG PO TABS: 5.0000 mg | ORAL_TABLET | Freq: Every day | ORAL | 3 refills | Status: DC

## 2017-05-09 NOTE — Telephone Encounter (Signed)
Left message for patient to call back  

## 2017-05-09 NOTE — Telephone Encounter (Signed)
New message ° °Pt verbalized that she is returning call for RN °

## 2017-05-09 NOTE — Telephone Encounter (Signed)
Patient stated she would like to stop Simvastatin and start Crestor 5 mg by mouth daily. Patient stated she would need a prescription sent to [facility] at fax number [phone]. Will fax on Monday.

## 2017-05-09 NOTE — Telephone Encounter (Signed)
Given her age stop statin for 3 months and see if helps if not can start crestor 5 mg

## 2017-05-19 ENCOUNTER — Telehealth: Payer: Self-pay | Admitting: Cardiovascular Disease

## 2017-05-19 NOTE — Telephone Encounter (Signed)
New message  Please call, leave detailed message.   Pt c/o medication issue:  1. Name of Medication: rosuvastatin (CRESTOR) 5 MG tablet  2. How are you currently taking this medication (dosage and times per day)? As prescribed  3. Are you having a reaction (difficulty breathing--STAT)? No  4. What is your medication issue? Patient wants information on side effects

## 2017-05-19 NOTE — Telephone Encounter (Signed)
Called patient and informed her that I would mail her information about Crestor and eating healthy to help with cholesterol. Patient verbalized understanding.

## 2017-05-30 ENCOUNTER — Emergency Department (HOSPITAL_COMMUNITY)
Admission: EM | Admit: 2017-05-30 | Discharge: 2017-05-31 | Disposition: A | Discharge status: Home / Self Care | Payer: Medicare Other | Attending: Emergency Medicine | Admitting: Emergency Medicine

## 2017-05-30 ENCOUNTER — Encounter (HOSPITAL_COMMUNITY): Payer: Self-pay | Admitting: Emergency Medicine

## 2017-05-30 DIAGNOSIS — C8590 Non-Hodgkin lymphoma, unspecified, unspecified site: Secondary | ICD-10-CM | POA: Insufficient documentation

## 2017-05-30 DIAGNOSIS — Z5111 Encounter for antineoplastic chemotherapy: Secondary | ICD-10-CM | POA: Insufficient documentation

## 2017-05-30 DIAGNOSIS — I2510 Atherosclerotic heart disease of native coronary artery without angina pectoris: Secondary | ICD-10-CM | POA: Diagnosis not present

## 2017-05-30 DIAGNOSIS — Z7982 Long term (current) use of aspirin: Secondary | ICD-10-CM | POA: Diagnosis not present

## 2017-05-30 DIAGNOSIS — E039 Hypothyroidism, unspecified: Secondary | ICD-10-CM | POA: Insufficient documentation

## 2017-05-30 DIAGNOSIS — J101 Influenza due to other identified influenza virus with other respiratory manifestations: Secondary | ICD-10-CM | POA: Diagnosis not present

## 2017-05-30 DIAGNOSIS — R21 Rash and other nonspecific skin eruption: Secondary | ICD-10-CM | POA: Diagnosis not present

## 2017-05-30 DIAGNOSIS — R112 Nausea with vomiting, unspecified: Secondary | ICD-10-CM | POA: Diagnosis present

## 2017-05-30 DIAGNOSIS — R05 Cough: Secondary | ICD-10-CM | POA: Insufficient documentation

## 2017-05-30 DIAGNOSIS — R42 Dizziness and giddiness: Secondary | ICD-10-CM | POA: Insufficient documentation

## 2017-05-30 DIAGNOSIS — Z87891 Personal history of nicotine dependence: Secondary | ICD-10-CM | POA: Insufficient documentation

## 2017-05-30 DIAGNOSIS — Z951 Presence of aortocoronary bypass graft: Secondary | ICD-10-CM | POA: Diagnosis not present

## 2017-05-30 DIAGNOSIS — M7918 Myalgia, other site: Secondary | ICD-10-CM | POA: Insufficient documentation

## 2017-05-30 DIAGNOSIS — R509 Fever, unspecified: Secondary | ICD-10-CM | POA: Insufficient documentation

## 2017-05-30 NOTE — ED Triage Notes (Signed)
Pt from [facility] with c/o nausea and fever that began today around noon. Pt reports 1 instance of emesis prior to EMS arrival, none since. Pt was given 4mg  zofran IV by EMS and 650 of tylenol PO by facility staff PTA. Pt is A&O x 4

## 2017-05-30 NOTE — ED Notes (Signed)
Bed: WA02 Expected date:  Expected time:  Means of arrival:  Comments: Flu like sx fever

## 2017-05-31 ENCOUNTER — Emergency Department (HOSPITAL_COMMUNITY): Payer: Medicare Other

## 2017-05-31 DIAGNOSIS — J101 Influenza due to other identified influenza virus with other respiratory manifestations: Secondary | ICD-10-CM | POA: Diagnosis not present

## 2017-05-31 LAB — URINALYSIS, ROUTINE W REFLEX MICROSCOPIC
Bilirubin Urine: NEGATIVE
GLUCOSE, UA: NEGATIVE mg/dL
HGB URINE DIPSTICK: NEGATIVE
Ketones, ur: NEGATIVE mg/dL
Leukocytes, UA: NEGATIVE
Nitrite: NEGATIVE
PROTEIN: NEGATIVE mg/dL
Specific Gravity, Urine: 1.011 (ref 1.005–1.030)
pH: 5 (ref 5.0–8.0)

## 2017-05-31 LAB — CBC
HCT: 35 % — ABNORMAL LOW (ref 36.0–46.0)
Hemoglobin: 11.8 g/dL — ABNORMAL LOW (ref 12.0–15.0)
MCH: 31.6 pg (ref 26.0–34.0)
MCHC: 33.7 g/dL (ref 30.0–36.0)
MCV: 93.6 fL (ref 78.0–100.0)
PLATELETS: 130 10*3/uL — AB (ref 150–400)
RBC: 3.74 MIL/uL — AB (ref 3.87–5.11)
RDW: 15.2 % (ref 11.5–15.5)
WBC: 5 10*3/uL (ref 4.0–10.5)

## 2017-05-31 LAB — COMPREHENSIVE METABOLIC PANEL
ALBUMIN: 3.4 g/dL — AB (ref 3.5–5.0)
ALT: 9 U/L — ABNORMAL LOW (ref 14–54)
AST: 20 U/L (ref 15–41)
Alkaline Phosphatase: 55 U/L (ref 38–126)
Anion gap: 9 (ref 5–15)
BUN: 14 mg/dL (ref 6–20)
CHLORIDE: 102 mmol/L (ref 101–111)
CO2: 23 mmol/L (ref 22–32)
Calcium: 9.1 mg/dL (ref 8.9–10.3)
Creatinine, Ser: 0.76 mg/dL (ref 0.44–1.00)
GFR calc non Af Amer: >60 mL/min (ref >60)
Glucose, Bld: 100 mg/dL — ABNORMAL HIGH (ref 65–99)
Potassium: 4.1 mmol/L (ref 3.5–5.1)
SODIUM: 134 mmol/L — AB (ref 135–145)
Total Bilirubin: 0.2 mg/dL — ABNORMAL LOW (ref 0.3–1.2)
Total Protein: 5.6 g/dL — ABNORMAL LOW (ref 6.5–8.1)

## 2017-05-31 LAB — LIPASE, BLOOD: LIPASE: 27 U/L (ref 11–51)

## 2017-05-31 LAB — INFLUENZA PANEL BY PCR (TYPE A & B)
Influenza A By PCR: POSITIVE — AB
Influenza B By PCR: NEGATIVE

## 2017-05-31 MED ORDER — ONDANSETRON 4 MG PO TBDP: 4.0000 mg | ORAL_TABLET | Freq: Three times a day (TID) | ORAL | 0 refills | Status: DC | PRN

## 2017-05-31 MED ORDER — AMOXICILLIN-POT CLAVULANATE 875-125 MG PO TABS: 1.0000 | ORAL_TABLET | Freq: Two times a day (BID) | ORAL | 0 refills | Status: DC

## 2017-05-31 MED ORDER — OSELTAMIVIR PHOSPHATE 75 MG PO CAPS: 75.0000 mg | ORAL_CAPSULE | Freq: Once | ORAL | Status: DC

## 2017-05-31 MED ORDER — LACTATED RINGERS IV BOLUS (SEPSIS)
1000.0000 mL | Freq: Once | INTRAVENOUS | Status: AC
  Administered 2017-05-31: 1000 mL via INTRAVENOUS

## 2017-05-31 MED ORDER — ACETAMINOPHEN ER 650 MG PO TBCR: 650.0000 mg | EXTENDED_RELEASE_TABLET | Freq: Three times a day (TID) | ORAL | 0 refills | Status: DC | PRN

## 2017-05-31 MED ORDER — OSELTAMIVIR PHOSPHATE 75 MG PO CAPS: 75.0000 mg | ORAL_CAPSULE | Freq: Two times a day (BID) | ORAL | 0 refills | Status: DC

## 2017-05-31 MED ORDER — OSELTAMIVIR PHOSPHATE 75 MG PO CAPS
75.0000 mg | ORAL_CAPSULE | Freq: Once | ORAL | Status: AC
  Administered 2017-05-31: 75 mg via ORAL
  Filled 2017-05-31: qty 1

## 2017-05-31 MED ORDER — PROBIOTIC 250 MG PO CAPS: 250.0000 mg | ORAL_CAPSULE | Freq: Two times a day (BID) | ORAL | 0 refills | Status: DC

## 2017-05-31 MED ORDER — AMOXICILLIN-POT CLAVULANATE 875-125 MG PO TABS
1.0000 | ORAL_TABLET | Freq: Once | ORAL | Status: AC
  Administered 2017-05-31: 1 via ORAL
  Filled 2017-05-31: qty 1

## 2017-05-31 MED ORDER — ACETAMINOPHEN 325 MG PO TABS
650.0000 mg | ORAL_TABLET | Freq: Once | ORAL | Status: AC
  Administered 2017-05-31: 650 mg via ORAL
  Filled 2017-05-31: qty 2

## 2017-05-31 NOTE — Discharge Instructions (Addendum)
Results in the ER show that you have influenza.   PLEASE TAKE THE MEDICINES PRESCRIBED. HYDRATE WELL. We are sending you home with Tamiflu for the flu, and Augmentin to prevent a pneumonia / bacterial lung infection.  Given your age, you are at high risk for complication from influenza. Therefore, please return to the ER IMMEDIATELY if your symptoms worsen; you have increased pain, fevers, chills, inability to keep any medications down, confusion.  Please have someone check on you every 2-4 hours at the assisted living.  Again, if they notice that you are getting worse advised him to call 911.

## 2017-05-31 NOTE — ED Notes (Signed)
Gave report to [employer], Therapist, sports at [place]. Left name in case she had additional questions.

## 2017-05-31 NOTE — ED Notes (Signed)
EDP at bedside  

## 2017-05-31 NOTE — ED Provider Notes (Signed)
[facility] DEPT Provider Note   CSN: [id number] Arrival date & time: 05/30/17  2331     History   Chief Complaint Chief Complaint  Patient presents with  . Nausea  . Fever    HPI Makayla Fisher is a 84 y.o. female.  HPI 84 year old female comes in a chief complaint of nausea and fever. Patient has history of CAD, non-Hodgkin's lymphoma -on maintenance chemo.  Patient states that she started feeling unwell yesterday evening, and her symptoms got worse as the day progressed.  Patient is having nausea with 1 round of emesis.  She is also complaining of body aches, cough with clear phlegm, fevers, chills.  Patient denies any known sick contacts.  Patient did get flu shot this year. Review of system is negative for severe headaches, neck stiffness, chest pain, shortness of breath, abdominal pain, UTI-like symptoms.  Past Medical History:  Diagnosis Date  . GERD (gastroesophageal reflux disease)   . Heart attack ([place]) 2009  . History of blood transfusion 2009  . Iron deficiency anemia due to chronic blood loss 08/07/2014  . NHL (non-Hodgkin's lymphoma) ([place]) 10/18/2013   finished chemo dec 2015, maintenanance retuxin    Patient Active Problem List   Diagnosis Date Noted  . TIA (transient ischemic attack) 04/30/2017  . HLD (hyperlipidemia) 04/30/2017  . Hx of CABG x 3/stent 04/30/2017  . Bilateral carotid artery stenosis 04/30/2017  . Non Hodgkin's lymphoma ([place]) 04/30/2017  . Hypothyroidism 04/30/2017  . Hypertension 04/30/2017  . S/P colostomy ([place]) 04/30/2017  . Anxiety disorder 04/30/2017  . Abnormal urinalysis 04/30/2017  . CAP (community acquired pneumonia) 12/13/2016  . Acute lower UTI 12/13/2016  . Glaucoma 12/13/2016  . Colostomy in place [facility]) 12/13/2016  . Vitamin D deficiency 10/20/2016  . Follicular lymphoma grade IIIa of intra-abdominal lymph nodes ([place]) 04/22/2016  . Iron deficiency anemia due to chronic blood loss 08/07/2014    . HTN (hypertension) 07/15/2014  . Unilateral carotid artery disease ([place]) 07/15/2014  . Bradycardia 07/15/2014  . 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 Preop cardiovascular exam 04/08/2014  . NHL (non-Hodgkin's lymphoma) ([place]) 10/18/2013  . 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 c section  1947, 1963  . CARDIAC SURGERY  2009  . CHOLECYSTECTOMY  1973  . COLON SURGERY  2014   Colostomy  . COLOSTOMY REVISION N/A 06/12/2014   Procedure: COLOSTOMY REVISION;  Surgeon: [name], MD;  Location: [facility] ORS;  Service: General;  Laterality: N/A;  . CORONARY ANGIOPLASTY WITH STENT PLACEMENT  2009  . CORONARY ARTERY BYPASS GRAFT  aug 2009    x3  . ESOPHAGOGASTRODUODENOSCOPY  2013  . EYE SURGERY Bilateral  10 years ago   lens replacments cataracts  . right chest pac  june 2015    OB History    No data available       Home Medications    Prior to Admission medications   Medication Sig Start Date End Date Taking? Authorizing Provider  acetaminophen (TYLENOL 8 HOUR) 650 MG CR tablet Take 1 tablet (650 mg total) by mouth every 8 (eight) hours as needed for pain. 05/31/17   Varney Biles, MD  amoxicillin-clavulanate (AUGMENTIN) 875-125 MG tablet Take 1 tablet by mouth 2 (two) times daily. 05/31/17   Varney Biles, MD  aspirin EC 81 MG EC tablet Take 1 tablet (81 mg total) by mouth daily. 05/02/17   Geradine Girt, DO  Calcium Carbonate-Vitamin D (CALCIUM 600+D) 600-400 MG-UNIT tablet Take 1 tablet by mouth daily.    [provider]  Cranberry 425 MG CAPS Take 425 mg by mouth daily.     [provider]  levothyroxine (SYNTHROID, LEVOTHROID) 50 MCG tablet Take 50 mcg by mouth every morning. 10/08/15   [provider]  lisinopril (PRINIVIL,ZESTRIL) 20 MG tablet Take 20 mg by mouth daily.    [provider]  loperamide (IMODIUM) 2 MG capsule Take 1 capsule (2 mg total)  by mouth every 4 (four) hours as needed for diarrhea or loose stools. 1 tablet with every unformed stool up to 6 doses per day 03/15/17   Virgel Manifold, MD  LORazepam (ATIVAN) 0.5 MG tablet Take 1 tablet (0.5 mg total) by mouth 2 (two) times daily as needed for anxiety. 12/19/16   Domenic Polite, MD  Multiple Vitamin (MULTIVITAMIN WITH MINERALS) TABS tablet Take 1 tablet by mouth daily.    [provider]  Multiple Vitamins-Minerals (RA VISION-VITE PRESERVE PO) Take 1 tablet by mouth 2 (two) times daily.     [provider]  olopatadine (PATANOL) 0.1 % ophthalmic solution Place 1 drop into both eyes 2 (two) times daily.    [provider]  ondansetron (ZOFRAN ODT) 4 MG disintegrating tablet Take 1 tablet (4 mg total) by mouth every 8 (eight) hours as needed for nausea or vomiting. 05/31/17   Varney Biles, MD  ondansetron (ZOFRAN ODT) 4 MG disintegrating tablet Take 1 tablet (4 mg total) by mouth every 8 (eight) hours as needed for nausea or vomiting. 05/31/17   Varney Biles, MD  oseltamivir (TAMIFLU) 75 MG capsule Take 1 capsule (75 mg total) by mouth every 12 (twelve) hours. 05/31/17   Varney Biles, MD  psyllium (METAMUCIL) 58.6 % packet Take 1 packet by mouth 3 (three) times a week.    [provider]  rOPINIRole (REQUIP) 1 MG tablet Take 1 mg by mouth at bedtime.    [provider]  rosuvastatin (CRESTOR) 5 MG tablet Take 1 tablet (5 mg total) by mouth at bedtime. 05/09/17   Josue Hector, MD  Saccharomyces boulardii (PROBIOTIC) 250 MG CAPS Take 250 mg by mouth 2 (two) times daily. 05/31/17   Varney Biles, MD  sertraline (ZOLOFT) 25 MG tablet Take 25 mg by mouth every morning.     [provider]  SPIRIVA HANDIHALER 18 MCG inhalation capsule Place 18 mcg into inhaler and inhale daily.  07/23/15   [provider]    Family History Family History  Problem Relation Age of Onset  . Diverticulitis Mother   . Heart attack Mother     . Ulcers Father     Social History Social History   Tobacco Use  . Smoking status: Former Smoker    Packs/day: 1.00    Years: 25.00    Pack years: 25.00    Types: Cigarettes    Start date: 05/16/1948    Last attempt to quit: 05/17/1963    Years since quitting: 54.0  . Smokeless tobacco: Never Used  . Tobacco comment: quit smoking 50 years ago  Substance Use Topics  . Alcohol use: No    Alcohol/week: 0.0 oz  . Drug use: No     Allergies   Epipen [epinephrine]; Heparin; Levaquin [levofloxacin in d5w]; Chlorhexidine gluconate; and Sulfa antibiotics   Review of Systems Review of Systems  Constitutional: Positive for activity change and fatigue.  Respiratory: Positive for cough. Negative for shortness of breath.  Cardiovascular: Negative for chest pain.  Gastrointestinal: Positive for nausea.  Skin: Positive for rash.  Allergic/Immunologic: Positive for immunocompromised state.  Neurological: Positive for light-headedness.  All other systems reviewed and are negative.    Physical Exam Updated Vital Signs BP 127/83 (BP Location: Right Arm)   Pulse 65   Temp 98.8 F (37.1 C) (Oral)   Resp 16   SpO2 98%   Physical Exam  Constitutional: She is oriented to person, place, and time. She appears well-developed.  HENT:  Head: Normocephalic and atraumatic.  Eyes: EOM are normal.  Neck: Normal range of motion. Neck supple.  Cardiovascular: Normal rate.  Pulmonary/Chest: Effort normal. No respiratory distress. She has no wheezes.  Abdominal: Bowel sounds are normal.  Musculoskeletal: She exhibits no edema.  Neurological: She is alert and oriented to person, place, and time.  Skin: Skin is warm and dry. Rash noted.  Nursing note and vitals reviewed.    ED Treatments / Results  Labs (all labs ordered are listed, but only abnormal results are displayed) Labs Reviewed  COMPREHENSIVE METABOLIC PANEL - Abnormal; Notable for the following components:      Result Value    Sodium 134 (*)    Glucose, Bld 100 (*)    Total Protein 5.6 (*)    Albumin 3.4 (*)    ALT 9 (*)    Total Bilirubin 0.2 (*)    All other components within normal limits  CBC - Abnormal; Notable for the following components:   RBC 3.74 (*)    Hemoglobin 11.8 (*)    HCT 35.0 (*)    Platelets 130 (*)    All other components within normal limits  URINALYSIS, ROUTINE W REFLEX MICROSCOPIC - Abnormal; Notable for the following components:   Color, Urine STRAW (*)    All other components within normal limits  INFLUENZA PANEL BY PCR (TYPE A & B) - Abnormal; Notable for the following components:   Influenza A By PCR POSITIVE (*)    All other components within normal limits  LIPASE, BLOOD    EKG  EKG Interpretation None       Radiology Dg Chest 2 View  Result Date: 05/31/2017 CLINICAL DATA:  Fever and headache EXAM: CHEST  2 VIEW COMPARISON:  12/14/2016 FINDINGS: Right-sided central venous catheter tip overlies the SVC. Post sternotomy changes. No pleural effusion. Mild upper lobe patchy pulmonary opacity. Aortic atherosclerosis. No pneumothorax. Stable moderate to marked compression deformity of lower thoracic vertebra. Surgical clips in the right upper quadrant. IMPRESSION: Vague upper lobe pulmonary opacity could reflect mild recurrent infiltrates. Radiographic follow-up to resolution recommended. Electronically Signed   By: Donavan Foil M.D.   On: 05/31/2017 02:12    Procedures Procedures (including critical care time)  Medications Ordered in ED Medications  lactated ringers bolus 1,000 mL (1,000 mLs Intravenous New Bag/Given 05/31/17 0425)  oseltamivir (TAMIFLU) capsule 75 mg (75 mg Oral Given 05/31/17 0425)     Initial Impression / Assessment and Plan / ED Course  I have reviewed the triage vital signs and the nursing notes.  Pertinent labs & imaging results that were available during my care of the patient were reviewed by me and considered in my medical decision making (see  chart for details).  Clinical Course as of May 31 510  Wed May 31, 2017  0223 Repeat lung exam does not reveal any focal rhonchi. Clinically not pneumonia. Flu suspicion is still higher.  Awaiting fluid results. DG Chest 2 View [AN]  [phone]  Results from the ER workup discussed with the patient face to face and all questions answered to the best of my ability.   Influenza A By PCR: (!) POSITIVE [AN]  0424 Patient's x-ray did show some infiltrate.  I think it will be better to send her home with Augmentin in addition to Tamiflu.  Patient continues to look well and I am comfortable discharging her.  Strict ER return precautions have been discussed.  [AN]    Clinical Course User Index [AN] Varney Biles, MD    84 year old comes in with chief complaint of nausea, fevers, chills, productive cough with clear phlegm. She got Tylenol prior to ED arrival.  Patient appears well right now, and is active, sharp.  Patient is here with her family members.  Clinically it seems like she is having influenza.  Patient did get a flu shot which is reassuring.  Lung exam was clear.  O2 sats have been between 95 200% on room air during my evaluation.  Patient does not have any abdominal discomfort.  She has an ostomy bag, without any new diarrhea.  No UTI-like symptoms either.  We will get basic lab workup and influenza. My suspicion for influenza is extremely high.  Patient is high risk candidate for influenza related complication given her age, history of CAD, lymphoma with maintenance chemo and assisted living status.  However, patient initially appears to be doing quite well, and I might be able to send her home if she has good follow-up, and she is comfortable with the plan.  Final Clinical Impressions(s) / ED Diagnoses   Final diagnoses:  Influenza A    ED Discharge Orders        Ordered    acetaminophen (TYLENOL 8 HOUR) 650 MG CR tablet  Every 8 hours PRN     05/31/17 0358    ondansetron (ZOFRAN  ODT) 4 MG disintegrating tablet  Every 8 hours PRN     05/31/17 0400    oseltamivir (TAMIFLU) 75 MG capsule  Every 12 hours     05/31/17 0423    amoxicillin-clavulanate (AUGMENTIN) 875-125 MG tablet  2 times daily     05/31/17 0423    ondansetron (ZOFRAN ODT) 4 MG disintegrating tablet  Every 8 hours PRN     05/31/17 0423    Saccharomyces boulardii (PROBIOTIC) 250 MG CAPS  2 times daily     05/31/17 0423       Varney Biles, MD 05/31/17 [phone]

## 2017-06-27 ENCOUNTER — Inpatient Hospital Stay: Payer: Medicare Other | Attending: Hematology & Oncology

## 2017-06-27 VITALS — BP 183/60 | HR 70 | Temp 98.0°F | Resp 16

## 2017-06-27 DIAGNOSIS — Z452 Encounter for adjustment and management of vascular access device: Secondary | ICD-10-CM | POA: Insufficient documentation

## 2017-06-27 DIAGNOSIS — C8233 Follicular lymphoma grade IIIa, intra-abdominal lymph nodes: Secondary | ICD-10-CM | POA: Diagnosis present

## 2017-06-27 DIAGNOSIS — C8253 Diffuse follicle center lymphoma, intra-abdominal lymph nodes: Secondary | ICD-10-CM

## 2017-06-27 MED ORDER — HEPARIN SOD (PORK) LOCK FLUSH 100 UNIT/ML IV SOLN
500.0000 [IU] | Freq: Once | INTRAVENOUS | Status: AC
  Administered 2017-06-27: 500 [IU] via INTRAVENOUS
  Filled 2017-06-27: qty 5

## 2017-06-27 MED ORDER — SODIUM CHLORIDE 0.9% FLUSH
10.0000 mL | INTRAVENOUS | Status: DC | PRN
  Administered 2017-06-27: 10 mL via INTRAVENOUS
  Filled 2017-06-27: qty 10

## 2017-06-27 NOTE — Patient Instructions (Signed)
Implanted Port Insertion, Care After °This sheet gives you information about how to care for yourself after your procedure. Your health care provider may also give you more specific instructions. If you have problems or questions, contact your health care provider. °What can I expect after the procedure? °After your procedure, it is common to have: °· Discomfort at the port insertion site. °· Bruising on the skin over the port. This should improve over 3-4 days. ° °Follow these instructions at home: °Port care °· After your port is placed, you will get a manufacturer's information card. The card has information about your port. Keep this card with you at all times. °· Take care of the port as told by your health care provider. Ask your health care provider if you or a family member can get training for taking care of the port at home. A home health care nurse may also take care of the port. °· Make sure to remember what type of port you have. °Incision care °· Follow instructions from your health care provider about how to take care of your port insertion site. Make sure you: °? Wash your hands with soap and water before you change your bandage (dressing). If soap and water are not available, use hand sanitizer. °? Change your dressing as told by your health care provider. °? Leave stitches (sutures), skin glue, or adhesive strips in place. These skin closures may need to stay in place for 2 weeks or longer. If adhesive strip edges start to loosen and curl up, you may trim the loose edges. Do not remove adhesive strips completely unless your health care provider tells you to do that. °· Check your port insertion site every day for signs of infection. Check for: °? More redness, swelling, or pain. °? More fluid or blood. °? Warmth. °? Pus or a bad smell. °General instructions °· Do not take baths, swim, or use a hot tub until your health care provider approves. °· Do not lift anything that is heavier than 10 lb (4.5  kg) for a week, or as told by your health care provider. °· Ask your health care provider when it is okay to: °? Return to work or school. °? Resume usual physical activities or sports. °· Do not drive for 24 hours if you were given a medicine to help you relax (sedative). °· Take over-the-counter and prescription medicines only as told by your health care provider. °· Wear a medical alert bracelet in case of an emergency. This will tell any health care providers that you have a port. °· Keep all follow-up visits as told by your health care provider. This is important. °Contact a health care provider if: °· You cannot flush your port with saline as directed, or you cannot draw blood from the port. °· You have a fever or chills. °· You have more redness, swelling, or pain around your port insertion site. °· You have more fluid or blood coming from your port insertion site. °· Your port insertion site feels warm to the touch. °· You have pus or a bad smell coming from the port insertion site. °Get help right away if: °· You have chest pain or shortness of breath. °· You have bleeding from your port that you cannot control. °Summary °· Take care of the port as told by your health care provider. °· Change your dressing as told by your health care provider. °· Keep all follow-up visits as told by your health care provider. °  This information is not intended to replace advice given to you by your health care provider. Make sure you discuss any questions you have with your health care provider. °Document Released: 01/30/2013 Document Revised: 03/02/2016 Document Reviewed: 03/02/2016 °Elsevier Interactive Patient Education © 2017 [employer] ° °

## 2017-07-18 ENCOUNTER — Ambulatory Visit: Payer: Self-pay | Admitting: Adult Health

## 2017-07-18 ENCOUNTER — Encounter: Payer: Self-pay | Admitting: Adult Health

## 2017-08-31 ENCOUNTER — Other Ambulatory Visit: Payer: Self-pay

## 2017-08-31 ENCOUNTER — Inpatient Hospital Stay: Payer: Medicare Other

## 2017-08-31 ENCOUNTER — Encounter: Payer: Self-pay | Admitting: Hematology & Oncology

## 2017-08-31 ENCOUNTER — Inpatient Hospital Stay: Payer: Medicare Other | Attending: Hematology & Oncology | Admitting: Hematology & Oncology

## 2017-08-31 VITALS — BP 165/63 | Temp 97.8°F | Resp 17 | Wt 176.0 lb

## 2017-08-31 VITALS — BP 165/63 | HR 71 | Temp 97.8°F | Resp 17

## 2017-08-31 DIAGNOSIS — D509 Iron deficiency anemia, unspecified: Secondary | ICD-10-CM | POA: Diagnosis not present

## 2017-08-31 DIAGNOSIS — Z933 Colostomy status: Secondary | ICD-10-CM | POA: Insufficient documentation

## 2017-08-31 DIAGNOSIS — K909 Intestinal malabsorption, unspecified: Secondary | ICD-10-CM | POA: Diagnosis not present

## 2017-08-31 DIAGNOSIS — K9403 Colostomy malfunction: Secondary | ICD-10-CM

## 2017-08-31 DIAGNOSIS — K56699 Other intestinal obstruction unspecified as to partial versus complete obstruction: Secondary | ICD-10-CM

## 2017-08-31 DIAGNOSIS — D500 Iron deficiency anemia secondary to blood loss (chronic): Secondary | ICD-10-CM

## 2017-08-31 DIAGNOSIS — C8233 Follicular lymphoma grade IIIa, intra-abdominal lymph nodes: Secondary | ICD-10-CM | POA: Insufficient documentation

## 2017-08-31 DIAGNOSIS — C8246 Follicular lymphoma grade IIIb, intrapelvic lymph nodes: Secondary | ICD-10-CM

## 2017-08-31 DIAGNOSIS — C8253 Diffuse follicle center lymphoma, intra-abdominal lymph nodes: Secondary | ICD-10-CM

## 2017-08-31 DIAGNOSIS — Z7982 Long term (current) use of aspirin: Secondary | ICD-10-CM | POA: Diagnosis not present

## 2017-08-31 DIAGNOSIS — Z95828 Presence of other vascular implants and grafts: Secondary | ICD-10-CM

## 2017-08-31 LAB — CBC WITH DIFFERENTIAL (CANCER CENTER ONLY)
BASOS ABS: 0 10*3/uL (ref 0.0–0.1)
BASOS PCT: 0 %
Eosinophils Absolute: 0.1 10*3/uL (ref 0.0–0.5)
Eosinophils Relative: 2 %
HEMATOCRIT: 36.5 % (ref 34.8–46.6)
Hemoglobin: 11.9 g/dL (ref 11.6–15.9)
Lymphocytes Relative: 20 %
Lymphs Abs: 1.4 10*3/uL (ref 0.9–3.3)
MCH: 31.6 pg (ref 26.0–34.0)
MCHC: 32.6 g/dL (ref 32.0–36.0)
MCV: 96.8 fL (ref 81.0–101.0)
MONOS PCT: 11 %
Monocytes Absolute: 0.8 10*3/uL (ref 0.1–0.9)
NEUTROS PCT: 67 %
Neutro Abs: 4.7 10*3/uL (ref 1.5–6.5)
Platelet Count: 148 10*3/uL (ref 145–400)
RBC: 3.77 MIL/uL (ref 3.70–5.32)
RDW: 14.5 % (ref 11.1–15.7)
WBC Count: 7 10*3/uL (ref 3.9–10.0)

## 2017-08-31 LAB — CMP (CANCER CENTER ONLY)
ALBUMIN: 3.8 g/dL (ref 3.5–5.0)
ALT: 15 U/L (ref 10–47)
AST: 20 U/L (ref 11–38)
Alkaline Phosphatase: 62 U/L (ref 26–84)
Anion gap: 3 — ABNORMAL LOW (ref 5–15)
BILIRUBIN TOTAL: 0.7 mg/dL (ref 0.2–1.6)
BUN: 14 mg/dL (ref 7–22)
CALCIUM: 9.5 mg/dL (ref 8.0–10.3)
CO2: 27 mmol/L (ref 18–33)
CREATININE: 1 mg/dL (ref 0.60–1.20)
Chloride: 110 mmol/L — ABNORMAL HIGH (ref 98–108)
GLUCOSE: 94 mg/dL (ref 73–118)
Potassium: 4 mmol/L (ref 3.3–4.7)
SODIUM: 140 mmol/L (ref 128–145)
Total Protein: 6.2 g/dL — ABNORMAL LOW (ref 6.4–8.1)

## 2017-08-31 MED ORDER — SODIUM CHLORIDE 0.9% FLUSH
10.0000 mL | INTRAVENOUS | Status: DC | PRN
  Administered 2017-08-31: 10 mL via INTRAVENOUS
  Filled 2017-08-31: qty 10

## 2017-08-31 MED ORDER — HEPARIN SOD (PORK) LOCK FLUSH 100 UNIT/ML IV SOLN
500.0000 [IU] | Freq: Once | INTRAVENOUS | Status: AC | PRN
  Administered 2017-08-31: 500 [IU]
  Filled 2017-08-31: qty 5

## 2017-08-31 NOTE — Progress Notes (Signed)
Hematology and Oncology Follow Up Visit  Makayla Fisher [id number] 1922/10/30 84 y.o. 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 is back for followup.  She is doing pretty well.  She still is at assisted living.  She comes in with her niece.  Her niece goes to church with me.  Makayla Fisher has not had any problems since we last saw her in January.  She got through the winter time without any difficulties.  She is on aspirin.  She is on Zocor.  She is gaining weight unfortunately.  Her appetite is doing quite well.  She has had no bleeding.  Her colostomy is working well.  She did have some problems with diarrhea so was taking some fiber to help with this.  She is on Synthroid.  Her family doctor follows her with Synthroid level.    Her iron studies back in January showed a ferritin of 77 with an iron saturation of 24%.  She has had no leg swelling.  She has had no fever.  She has had no headache.  Overall, her performance status is ECOG 1-2.  Medications:  Current Outpatient Medications:  .  acetaminophen (TYLENOL 8 HOUR) 650 MG CR tablet, Take 1 tablet (650 mg total) by mouth every 8 (eight) hours as needed for pain., Disp: 30 tablet, Rfl: 0 .  aspirin EC 81 MG EC tablet, Take 1 tablet (81 mg total) by mouth daily., Disp: , Rfl:  .  Calcium Carbonate-Vitamin D (CALCIUM 600+D) 600-400 MG-UNIT tablet, Take 1 tablet by mouth daily., Disp: , Rfl:  .  Cranberry 425 MG CAPS, Take 425 mg by mouth daily. , Disp: , Rfl:  .  levothyroxine (SYNTHROID, LEVOTHROID) 50 MCG tablet, Take 50 mcg by mouth every morning., Disp: , Rfl:  .  lisinopril (PRINIVIL,ZESTRIL) 20 MG tablet, Take 20 mg by mouth daily. Take 1/2 tablet, 10 mg daily., Disp: , Rfl:  .   loperamide (IMODIUM) 2 MG capsule, Take 1 capsule (2 mg total) by mouth every 4 (four) hours as needed for diarrhea or loose stools. 1 tablet with every unformed stool up to 6 doses per day, Disp: 20 capsule, Rfl: 0 .  LORazepam (ATIVAN) 0.5 MG tablet, Take 1 tablet (0.5 mg total) by mouth 2 (two) times daily as needed for anxiety., Disp: 10 tablet, Rfl: 0 .  Multiple Vitamin (MULTIVITAMIN WITH MINERALS) TABS tablet, Take 1 tablet by mouth daily., Disp: , Rfl:  .  Multiple Vitamins-Minerals (RA VISION-VITE PRESERVE PO), Take 1 tablet by mouth 2 (two) times daily. , Disp: , Rfl:  .  ondansetron (ZOFRAN ODT) 4 MG disintegrating tablet, Take 1 tablet (4 mg total) by mouth every 8 (eight) hours as needed for nausea or vomiting., Disp: 20 tablet, Rfl: 0 .  psyllium (METAMUCIL) 58.6 % packet, Take 1 packet by mouth 3 (three) times a week., Disp: , Rfl:  .  rOPINIRole (REQUIP) 1 MG tablet, Take 1 mg by mouth at bedtime., Disp: , Rfl:  .  Saccharomyces boulardii (PROBIOTIC) 250 MG CAPS, Take 250 mg by mouth 2 (two) times daily., Disp: 14 capsule, Rfl: 0 .  sertraline (ZOLOFT) 25 MG tablet, Take 25 mg by mouth every morning. , Disp: , Rfl:  .  SPIRIVA HANDIHALER 18 MCG  inhalation capsule, Place 18 mcg into inhaler and inhale daily. , Disp: , Rfl:  No current facility-administered medications for this visit.   Facility-Administered Medications Ordered in Other Visits:  .  sodium chloride flush (NS) 0.9 % injection 10 mL, 10 mL, Intravenous, PRN, [place], Holli Humbles, NP, 10 mL at 08/31/17 1441  Allergies:  Allergies  Allergen Reactions  . Epipen [Epinephrine]     Heart palpitations  . Heparin     Unsure of side affects (maybe affected platelets per pt)  . Levaquin [Levofloxacin In D5w] Other (See Comments)    Weakness  . Chlorhexidine Gluconate Other (See Comments)    Pt declined use and suspects she may have an intolerance,   . Sulfa Antibiotics Rash    Past Medical History, Surgical history,  Social history, and Family History were reviewed and updated.  Review of Systems: Review of Systems  Constitutional: Negative.   HENT: Negative.   Eyes: Negative.   Respiratory: Positive for shortness of breath.   Cardiovascular: Positive for palpitations.  Gastrointestinal: Positive for diarrhea and nausea.  Genitourinary: Negative.   Musculoskeletal: Positive for joint pain and myalgias.  Skin: Negative.   Neurological: Positive for tingling.  Endo/Heme/Allergies: Negative.   Psychiatric/Behavioral: Negative.      Physical Exam:  vitals were not taken for this visit.   Physical Exam  Constitutional: She is oriented to person, place, and time.  HENT:  Head: Normocephalic and atraumatic.  Mouth/Throat: Oropharynx is clear and moist.  Eyes: Pupils are equal, round, and reactive to light. EOM are normal.  Neck: Normal range of motion.  Cardiovascular: Normal rate, regular rhythm and normal heart sounds.  Pulmonary/Chest: Effort normal and breath sounds normal.  Abdominal: Soft. Bowel sounds are normal.  Musculoskeletal: Normal range of motion. She exhibits no edema, tenderness or deformity.  Lymphadenopathy:    She has no cervical adenopathy.  Neurological: She is alert and oriented to person, place, and time.  Skin: Skin is warm and dry. No rash noted. No erythema.  Psychiatric: She has a normal mood and affect. Her behavior is normal. Judgment and thought content normal.  Vitals reviewed.    Lab Results  Component Value Date   WBC 7.0 08/31/2017   HGB 11.9 08/31/2017   HCT 36.5 08/31/2017   MCV 96.8 08/31/2017   PLT 148 08/31/2017     Chemistry      Component Value Date/Time   NA 140 08/31/2017 1450   NA 143 01/31/2017 0931   NA 141 05/08/2014 1041   K 4.0 08/31/2017 1450   K 3.7 01/31/2017 0931   K 4.5 05/08/2014 1041   CL 110 (H) 08/31/2017 1450   CL 106 01/31/2017 0931   CO2 27 08/31/2017 1450   CO2 27 01/31/2017 0931   CO2 24 05/08/2014 1041   BUN  14 08/31/2017 1450   BUN 10 01/31/2017 0931   BUN 14.2 05/08/2014 1041   CREATININE 1.00 08/31/2017 1450   CREATININE 0.9 01/31/2017 0931   CREATININE 0.8 05/08/2014 1041      Component Value Date/Time   CALCIUM 9.5 08/31/2017 1450   CALCIUM 9.7 01/31/2017 0931   CALCIUM 9.2 05/08/2014 1041   ALKPHOS 62 08/31/2017 1450   ALKPHOS 57 01/31/2017 0931   ALKPHOS 66 05/08/2014 1041   AST 20 08/31/2017 1450   AST 23 05/08/2014 1041   ALT 15 08/31/2017 1450   ALT 14 01/31/2017 0931   ALT 10 05/08/2014 1041   BILITOT 0.7 08/31/2017 1450  BILITOT 0.42 05/08/2014 1041      Impression and Plan: Makayla Fisher is 84 year old female. She has a follicular large cell lymphoma. I do not see any evidence of recurrent disease. I do not believe that she is at significant risk for occurrence. Her labs look really good.  Again, everything looks fine from my point of view.  She wants to know if we can do an MRI or scans on her to see if her lymphoma has come back.  I told her that there is no standard approval for this.  By guidelines, there is no indication for routine scans unless there are symptoms.  We will plan to get her back in another 4 months.  She still has her Port-A-Cath in.  We had to have this flushed every 2 months.    [name], MD 5/9/20193:59 PM

## 2017-09-01 ENCOUNTER — Telehealth: Payer: Self-pay | Admitting: *Deleted

## 2017-09-01 LAB — FERRITIN: FERRITIN: 60 ng/mL (ref 9–269)

## 2017-09-01 LAB — IRON AND TIBC
IRON: 90 ug/dL (ref 41–142)
Saturation Ratios: 34 % (ref 21–57)
TIBC: 265 ug/dL (ref 236–444)
UIBC: 175 ug/dL

## 2017-09-01 LAB — LACTATE DEHYDROGENASE: LDH: 201 U/L (ref 125–245)

## 2017-09-01 NOTE — Telephone Encounter (Addendum)
-----   Message from [name], MD sent at 09/01/2017 11:37 AM EDT ----- Called patient and left message on her personal voicemail that her iron is ok!!

## 2017-10-27 ENCOUNTER — Other Ambulatory Visit: Payer: Self-pay

## 2017-10-27 ENCOUNTER — Inpatient Hospital Stay: Payer: Medicare Other | Attending: Hematology & Oncology

## 2017-10-27 VITALS — BP 165/67 | HR 76 | Temp 97.8°F | Resp 18

## 2017-10-27 DIAGNOSIS — Z452 Encounter for adjustment and management of vascular access device: Secondary | ICD-10-CM | POA: Insufficient documentation

## 2017-10-27 DIAGNOSIS — C8233 Follicular lymphoma grade IIIa, intra-abdominal lymph nodes: Secondary | ICD-10-CM | POA: Insufficient documentation

## 2017-10-27 DIAGNOSIS — Z95828 Presence of other vascular implants and grafts: Secondary | ICD-10-CM

## 2017-10-27 MED ORDER — HEPARIN SOD (PORK) LOCK FLUSH 100 UNIT/ML IV SOLN
500.0000 [IU] | Freq: Once | INTRAVENOUS | Status: AC
  Administered 2017-10-27: 500 [IU] via INTRAVENOUS
  Filled 2017-10-27: qty 5

## 2017-10-27 MED ORDER — SODIUM CHLORIDE 0.9% FLUSH
10.0000 mL | INTRAVENOUS | Status: DC | PRN
  Administered 2017-10-27: 10 mL via INTRAVENOUS
  Filled 2017-10-27: qty 10

## 2017-10-27 NOTE — Patient Instructions (Signed)

## 2018-01-04 ENCOUNTER — Inpatient Hospital Stay: Payer: Medicare Other

## 2018-01-04 ENCOUNTER — Inpatient Hospital Stay: Payer: Medicare Other | Attending: Hematology & Oncology | Admitting: Hematology & Oncology

## 2018-01-04 ENCOUNTER — Other Ambulatory Visit: Payer: Self-pay

## 2018-01-04 VITALS — BP 161/57 | HR 57 | Temp 97.8°F | Resp 18

## 2018-01-04 DIAGNOSIS — D509 Iron deficiency anemia, unspecified: Secondary | ICD-10-CM | POA: Diagnosis not present

## 2018-01-04 DIAGNOSIS — Z933 Colostomy status: Secondary | ICD-10-CM | POA: Insufficient documentation

## 2018-01-04 DIAGNOSIS — C8253 Diffuse follicle center lymphoma, intra-abdominal lymph nodes: Secondary | ICD-10-CM

## 2018-01-04 DIAGNOSIS — C8233 Follicular lymphoma grade IIIa, intra-abdominal lymph nodes: Secondary | ICD-10-CM | POA: Diagnosis present

## 2018-01-04 DIAGNOSIS — C8283 Other types of follicular lymphoma, intra-abdominal lymph nodes: Secondary | ICD-10-CM

## 2018-01-04 DIAGNOSIS — E039 Hypothyroidism, unspecified: Secondary | ICD-10-CM | POA: Insufficient documentation

## 2018-01-04 DIAGNOSIS — Z23 Encounter for immunization: Secondary | ICD-10-CM | POA: Insufficient documentation

## 2018-01-04 DIAGNOSIS — D500 Iron deficiency anemia secondary to blood loss (chronic): Secondary | ICD-10-CM

## 2018-01-04 DIAGNOSIS — C8246 Follicular lymphoma grade IIIb, intrapelvic lymph nodes: Secondary | ICD-10-CM

## 2018-01-04 DIAGNOSIS — K909 Intestinal malabsorption, unspecified: Secondary | ICD-10-CM | POA: Insufficient documentation

## 2018-01-04 DIAGNOSIS — K56699 Other intestinal obstruction unspecified as to partial versus complete obstruction: Secondary | ICD-10-CM

## 2018-01-04 DIAGNOSIS — K9403 Colostomy malfunction: Secondary | ICD-10-CM

## 2018-01-04 LAB — CMP (CANCER CENTER ONLY)
ALT: 15 U/L (ref 10–47)
AST: 23 U/L (ref 11–38)
Albumin: 3.7 g/dL (ref 3.5–5.0)
Alkaline Phosphatase: 73 U/L (ref 26–84)
Anion gap: 4 — ABNORMAL LOW (ref 5–15)
BUN: 12 mg/dL (ref 7–22)
CO2: 25 mmol/L (ref 18–33)
CREATININE: 0.8 mg/dL (ref 0.60–1.20)
Calcium: 9.8 mg/dL (ref 8.0–10.3)
Chloride: 111 mmol/L — ABNORMAL HIGH (ref 98–108)
Glucose, Bld: 101 mg/dL (ref 73–118)
Potassium: 4 mmol/L (ref 3.3–4.7)
SODIUM: 140 mmol/L (ref 128–145)
Total Bilirubin: 0.6 mg/dL (ref 0.2–1.6)
Total Protein: 6.3 g/dL — ABNORMAL LOW (ref 6.4–8.1)

## 2018-01-04 LAB — CBC WITH DIFFERENTIAL (CANCER CENTER ONLY)
BASOS PCT: 1 %
Basophils Absolute: 0 10*3/uL (ref 0.0–0.1)
EOS ABS: 0.5 10*3/uL (ref 0.0–0.5)
EOS PCT: 7 %
HCT: 38.7 % (ref 34.8–46.6)
HEMOGLOBIN: 12.5 g/dL (ref 11.6–15.9)
Lymphocytes Relative: 19 %
Lymphs Abs: 1.3 10*3/uL (ref 0.9–3.3)
MCH: 30.3 pg (ref 26.0–34.0)
MCHC: 32.3 g/dL (ref 32.0–36.0)
MCV: 93.9 fL (ref 81.0–101.0)
MONO ABS: 0.8 10*3/uL (ref 0.1–0.9)
MONOS PCT: 13 %
NEUTROS PCT: 60 %
Neutro Abs: 3.9 10*3/uL (ref 1.5–6.5)
PLATELETS: 172 10*3/uL (ref 145–400)
RBC: 4.12 MIL/uL (ref 3.70–5.32)
RDW: 14.3 % (ref 11.1–15.7)
WBC Count: 6.5 10*3/uL (ref 3.9–10.0)

## 2018-01-04 MED ORDER — SODIUM CHLORIDE 0.9 % IJ SOLN
10.0000 mL | INTRAMUSCULAR | Status: DC | PRN
  Administered 2018-01-04: 10 mL
  Filled 2018-01-04: qty 10

## 2018-01-04 MED ORDER — HEPARIN SOD (PORK) LOCK FLUSH 100 UNIT/ML IV SOLN
500.0000 [IU] | Freq: Once | INTRAVENOUS | Status: AC | PRN
  Administered 2018-01-04: 500 [IU]
  Filled 2018-01-04: qty 5

## 2018-01-04 MED ORDER — ALTEPLASE 2 MG IJ SOLR
2.0000 mg | Freq: Once | INTRAMUSCULAR | Status: DC | PRN
  Filled 2018-01-04: qty 2

## 2018-01-04 NOTE — Progress Notes (Signed)
Hematology and Oncology Follow Up Visit  Makayla Fisher [id number] 11-02-1922 84 y.o. 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  Makayla Fisher is back for followup.  Her main complaint is that she is feeling tired all the time.  I am not sure exactly why she is feeling tired although she does have hypothyroidism.  Not sure when her last thyroid levels were checked.  The last levels I have her back in January and her TSH was 2.6.  We have checked her iron studies.  Her ferritin back in May was 60 with iron saturation of 34%.  Her weight is going up.  Her blood pressure is on the high side.  I think that she probably needs to have her blood pressure medication readjusted.  She has no problems with her colostomy.  She is had no fever.  There is been no cough.  She is had no rashes.  She has not noted any leg swelling.  There is been no dysuria.  Overall, her performance status is ECOG 2.    Medications:  Current Outpatient Medications:  .  acetaminophen (TYLENOL 8 HOUR) 650 MG CR tablet, Take 1 tablet (650 mg total) by mouth every 8 (eight) hours as needed for pain., Disp: 30 tablet, Rfl: 0 .  aspirin EC 81 MG EC tablet, Take 1 tablet (81 mg total) by mouth daily., Disp: , Rfl:  .  Calcium Carbonate-Vitamin D (CALCIUM 600+D) 600-400 MG-UNIT tablet, Take 1 tablet by mouth daily., Disp: , Rfl:  .  Cranberry 425 MG CAPS, Take 425 mg by mouth daily. , Disp: , Rfl:  .  levothyroxine (SYNTHROID, LEVOTHROID) 50 MCG tablet, Take 50 mcg by mouth every morning., Disp: , Rfl:  .  lisinopril (PRINIVIL,ZESTRIL) 20 MG tablet, Take 20 mg by mouth daily. Take 1/2 tablet, 10 mg daily., Disp: , Rfl:  .  loperamide (IMODIUM) 2 MG capsule, Take 1 capsule (2 mg total) by  mouth every 4 (four) hours as needed for diarrhea or loose stools. 1 tablet with every unformed stool up to 6 doses per day, Disp: 20 capsule, Rfl: 0 .  LORazepam (ATIVAN) 0.5 MG tablet, Take 1 tablet (0.5 mg total) by mouth 2 (two) times daily as needed for anxiety., Disp: 10 tablet, Rfl: 0 .  Multiple Vitamin (MULTIVITAMIN WITH MINERALS) TABS tablet, Take 1 tablet by mouth daily., Disp: , Rfl:  .  Multiple Vitamins-Minerals (RA VISION-VITE PRESERVE PO), Take 1 tablet by mouth 2 (two) times daily. , Disp: , Rfl:  .  ondansetron (ZOFRAN ODT) 4 MG disintegrating tablet, Take 1 tablet (4 mg total) by mouth every 8 (eight) hours as needed for nausea or vomiting., Disp: 20 tablet, Rfl: 0 .  psyllium (METAMUCIL) 58.6 % packet, Take 1 packet by mouth 3 (three) times a week., Disp: , Rfl:  .  rOPINIRole (REQUIP) 1 MG tablet, Take 1 mg by mouth at bedtime., Disp: , Rfl:  .  Saccharomyces boulardii (PROBIOTIC) 250 MG CAPS, Take 250 mg by mouth 2 (two) times daily., Disp: 14 capsule, Rfl: 0 .  sertraline (ZOLOFT) 25 MG tablet, Take 25 mg by mouth every morning. , Disp: , Rfl:  .  SPIRIVA HANDIHALER 18 MCG inhalation capsule, Place 18 mcg into inhaler and inhale daily. , Disp: ,  Rfl:   Allergies:  Allergies  Allergen Reactions  . Epipen [Epinephrine]     Heart palpitations  . Heparin     Unsure of side affects (maybe affected platelets per pt)  . Levaquin [Levofloxacin In D5w] Other (See Comments)    Weakness  . Chlorhexidine Gluconate Other (See Comments)    Pt declined use and suspects she may have an intolerance,   . Sulfa Antibiotics Rash    Past Medical History, Surgical history, Social history, and Family History were reviewed and updated.  Review of Systems: Review of Systems  Constitutional: Negative.   HENT: Negative.   Eyes: Negative.   Respiratory: Positive for shortness of breath.   Cardiovascular: Positive for palpitations.  Gastrointestinal: Positive for diarrhea and nausea.    Genitourinary: Negative.   Musculoskeletal: Positive for joint pain and myalgias.  Skin: Negative.   Neurological: Positive for tingling.  Endo/Heme/Allergies: Negative.   Psychiatric/Behavioral: Negative.      Physical Exam:  oral temperature is 97.8 F (36.6 C). Her blood pressure is 161/57 (abnormal) and her pulse is 57 (abnormal). Her respiration is 18.   Physical Exam  Constitutional: She is oriented to person, place, and time.  HENT:  Head: Normocephalic and atraumatic.  Mouth/Throat: Oropharynx is clear and moist.  Eyes: Pupils are equal, round, and reactive to light. EOM are normal.  Neck: Normal range of motion.  Cardiovascular: Normal rate, regular rhythm and normal heart sounds.  Pulmonary/Chest: Effort normal and breath sounds normal.  Abdominal: Soft. Bowel sounds are normal.  Abdominal exam shows a colostomy in the left lower quadrant.  She has multiple surgical scars.  She has no fluid wave.  There is no guarding or rebound tenderness.  There is no palpable liver or spleen tip.  Musculoskeletal: Normal range of motion. She exhibits no edema, tenderness or deformity.  Lymphadenopathy:    She has no cervical adenopathy.  Neurological: She is alert and oriented to person, place, and time.  Skin: Skin is warm and dry. No rash noted. No erythema.  Psychiatric: She has a normal mood and affect. Her behavior is normal. Judgment and thought content normal.  Vitals reviewed.    Lab Results  Component Value Date   WBC 6.5 01/04/2018   HGB 12.5 01/04/2018   HCT 38.7 01/04/2018   MCV 93.9 01/04/2018   PLT 172 01/04/2018     Chemistry      Component Value Date/Time   NA 140 01/04/2018 1430   NA 143 01/31/2017 0931   NA 141 05/08/2014 1041   K 4.0 01/04/2018 1430   K 3.7 01/31/2017 0931   K 4.5 05/08/2014 1041   CL 111 (H) 01/04/2018 1430   CL 106 01/31/2017 0931   CO2 25 01/04/2018 1430   CO2 27 01/31/2017 0931   CO2 24 05/08/2014 1041   BUN 12 01/04/2018  1430   BUN 10 01/31/2017 0931   BUN 14.2 05/08/2014 1041   CREATININE 0.80 01/04/2018 1430   CREATININE 0.9 01/31/2017 0931   CREATININE 0.8 05/08/2014 1041      Component Value Date/Time   CALCIUM 9.8 01/04/2018 1430   CALCIUM 9.7 01/31/2017 0931   CALCIUM 9.2 05/08/2014 1041   ALKPHOS 73 01/04/2018 1430   ALKPHOS 57 01/31/2017 0931   ALKPHOS 66 05/08/2014 1041   AST 23 01/04/2018 1430   AST 23 05/08/2014 1041   ALT 15 01/04/2018 1430   ALT 14 01/31/2017 0931   ALT 10 05/08/2014 1041   BILITOT 0.6  01/04/2018 1430   BILITOT 0.42 05/08/2014 1041      Impression and Plan: Ms. Parlow is 84 year old female. She has a follicular large cell lymphoma. I do not see any evidence of recurrent disease. I do not believe that she is at significant risk for occurrence. Her labs look really good.  From my point of view, there is no problems with lymphoma.  It is been a year since she completed the maintenance Rituxan.  Is been 3 years since she completed the chemotherapy.  Again, her thyroid probably needs to be checked again.  I probably would also think about getting her blood pressure medications readjusted.  She has her Port-A-Cath flushed every 2 months.  We will have her come back to see [place] in another 4 months.  We will try to get her through the holidays.   [name], MD 9/12/20193:54 PM

## 2018-01-05 LAB — LACTATE DEHYDROGENASE: LDH: 206 U/L — AB (ref 98–192)

## 2018-01-05 LAB — IRON AND TIBC
IRON: 45 ug/dL (ref 41–142)
SATURATION RATIOS: 17 % — AB (ref 21–57)
TIBC: 256 ug/dL (ref 236–444)
UIBC: 212 ug/dL

## 2018-01-05 LAB — FERRITIN: Ferritin: 64 ng/mL (ref 11–307)

## 2018-01-08 ENCOUNTER — Telehealth: Payer: Self-pay | Admitting: *Deleted

## 2018-01-08 NOTE — Telephone Encounter (Signed)
-----   Message from Makayla Napoleon, MD sent at 01/05/2018  3:16 PM EDT ----- Call - the iron level is low.  She needs to come back for 1 dose of IV iron!!!  Please set up for 1-2 week.  Makayla Fisher

## 2018-01-09 ENCOUNTER — Telehealth: Payer: Self-pay | Admitting: *Deleted

## 2018-01-09 NOTE — Telephone Encounter (Signed)
-----   Message from [name], MD sent at 01/05/2018  3:16 PM EDT ----- Call - the iron level is low.  She needs to come back for 1 dose of IV iron!!!  Please set up for 1-2 week.  Laurey Arrow

## 2018-01-10 ENCOUNTER — Telehealth: Payer: Self-pay | Admitting: *Deleted

## 2018-01-10 NOTE — Telephone Encounter (Signed)
-----   Message from Makayla Napoleon, MD sent at 01/05/2018  3:16 PM EDT ----- Call - the iron level is low.  She needs to come back for 1 dose of IV iron!!!  Please set up for 1-2 week.  Makayla Fisher

## 2018-01-10 NOTE — Telephone Encounter (Signed)
Unable to reach Ms. Purves via phone. Patient's niece, [name] notified of patient's need for one dose of IV iron per Dr. Marin Olp.  [name] states that she will notify patient and have her call office to schedule appt. [name] appreciative of call and has no questions at this time.

## 2018-01-16 ENCOUNTER — Inpatient Hospital Stay: Payer: Medicare Other

## 2018-01-16 VITALS — BP 163/57 | HR 63 | Temp 98.2°F | Resp 18

## 2018-01-16 DIAGNOSIS — C8233 Follicular lymphoma grade IIIa, intra-abdominal lymph nodes: Secondary | ICD-10-CM | POA: Diagnosis not present

## 2018-01-16 DIAGNOSIS — C8283 Other types of follicular lymphoma, intra-abdominal lymph nodes: Secondary | ICD-10-CM

## 2018-01-16 DIAGNOSIS — Z23 Encounter for immunization: Secondary | ICD-10-CM

## 2018-01-16 MED ORDER — SODIUM CHLORIDE 0.9% FLUSH
10.0000 mL | INTRAVENOUS | Status: DC | PRN
  Administered 2018-01-16: 10 mL via INTRAVENOUS
  Filled 2018-01-16: qty 10

## 2018-01-16 MED ORDER — INFLUENZA VAC SPLIT QUAD 0.5 ML IM SUSY
PREFILLED_SYRINGE | INTRAMUSCULAR | Status: AC
  Filled 2018-01-16: qty 0.5

## 2018-01-16 MED ORDER — INFLUENZA VAC SPLIT QUAD 0.5 ML IM SUSY
0.5000 mL | PREFILLED_SYRINGE | Freq: Once | INTRAMUSCULAR | Status: AC
  Administered 2018-01-16: 0.5 mL via INTRAMUSCULAR

## 2018-01-16 MED ORDER — HEPARIN SOD (PORK) LOCK FLUSH 100 UNIT/ML IV SOLN
500.0000 [IU] | Freq: Once | INTRAVENOUS | Status: AC
  Administered 2018-01-16: 500 [IU] via INTRAVENOUS
  Filled 2018-01-16: qty 5

## 2018-01-16 MED ORDER — SODIUM CHLORIDE 0.9 % IV SOLN
510.0000 mg | Freq: Once | INTRAVENOUS | Status: AC
  Administered 2018-01-16: 510 mg via INTRAVENOUS
  Filled 2018-01-16: qty 17

## 2018-01-16 MED ORDER — SODIUM CHLORIDE 0.9 % IV SOLN
INTRAVENOUS | Status: DC
  Administered 2018-01-16: 15:00:00 via INTRAVENOUS
  Filled 2018-01-16: qty 250

## 2018-01-16 NOTE — Patient Instructions (Signed)

## 2018-02-07 ENCOUNTER — Other Ambulatory Visit: Payer: Self-pay | Admitting: Family Medicine

## 2018-02-07 DIAGNOSIS — Z1231 Encounter for screening mammogram for malignant neoplasm of breast: Secondary | ICD-10-CM

## 2018-03-06 ENCOUNTER — Encounter: Payer: Self-pay | Admitting: Cardiovascular Disease

## 2018-03-06 ENCOUNTER — Inpatient Hospital Stay: Payer: Medicare Other | Attending: Hematology & Oncology

## 2018-03-06 DIAGNOSIS — Z452 Encounter for adjustment and management of vascular access device: Secondary | ICD-10-CM | POA: Diagnosis not present

## 2018-03-06 DIAGNOSIS — C8233 Follicular lymphoma grade IIIa, intra-abdominal lymph nodes: Secondary | ICD-10-CM | POA: Diagnosis present

## 2018-03-06 DIAGNOSIS — Z95828 Presence of other vascular implants and grafts: Secondary | ICD-10-CM

## 2018-03-06 MED ORDER — HEPARIN SOD (PORK) LOCK FLUSH 100 UNIT/ML IV SOLN
500.0000 [IU] | Freq: Once | INTRAVENOUS | Status: AC
  Administered 2018-03-06: 500 [IU] via INTRAVENOUS
  Filled 2018-03-06: qty 5

## 2018-03-06 MED ORDER — SODIUM CHLORIDE 0.9% FLUSH
10.0000 mL | INTRAVENOUS | Status: DC | PRN
  Administered 2018-03-06: 10 mL via INTRAVENOUS
  Filled 2018-03-06: qty 10

## 2018-03-06 NOTE — Patient Instructions (Signed)
Implanted Port Insertion, Care After °This sheet gives you information about how to care for yourself after your procedure. Your health care provider may also give you more specific instructions. If you have problems or questions, contact your health care provider. °What can I expect after the procedure? °After your procedure, it is common to have: °· Discomfort at the port insertion site. °· Bruising on the skin over the port. This should improve over 3-4 days. ° °Follow these instructions at home: °Port care °· After your port is placed, you will get a manufacturer's information card. The card has information about your port. Keep this card with you at all times. °· Take care of the port as told by your health care provider. Ask your health care provider if you or a family member can get training for taking care of the port at home. A home health care nurse may also take care of the port. °· Make sure to remember what type of port you have. °Incision care °· Follow instructions from your health care provider about how to take care of your port insertion site. Make sure you: °? Wash your hands with soap and water before you change your bandage (dressing). If soap and water are not available, use hand sanitizer. °? Change your dressing as told by your health care provider. °? Leave stitches (sutures), skin glue, or adhesive strips in place. These skin closures may need to stay in place for 2 weeks or longer. If adhesive strip edges start to loosen and curl up, you may trim the loose edges. Do not remove adhesive strips completely unless your health care provider tells you to do that. °· Check your port insertion site every day for signs of infection. Check for: °? More redness, swelling, or pain. °? More fluid or blood. °? Warmth. °? Pus or a bad smell. °General instructions °· Do not take baths, swim, or use a hot tub until your health care provider approves. °· Do not lift anything that is heavier than 10 lb (4.5  kg) for a week, or as told by your health care provider. °· Ask your health care provider when it is okay to: °? Return to work or school. °? Resume usual physical activities or sports. °· Do not drive for 24 hours if you were given a medicine to help you relax (sedative). °· Take over-the-counter and prescription medicines only as told by your health care provider. °· Wear a medical alert bracelet in case of an emergency. This will tell any health care providers that you have a port. °· Keep all follow-up visits as told by your health care provider. This is important. °Contact a health care provider if: °· You cannot flush your port with saline as directed, or you cannot draw blood from the port. °· You have a fever or chills. °· You have more redness, swelling, or pain around your port insertion site. °· You have more fluid or blood coming from your port insertion site. °· Your port insertion site feels warm to the touch. °· You have pus or a bad smell coming from the port insertion site. °Get help right away if: °· You have chest pain or shortness of breath. °· You have bleeding from your port that you cannot control. °Summary °· Take care of the port as told by your health care provider. °· Change your dressing as told by your health care provider. °· Keep all follow-up visits as told by your health care provider. °  This information is not intended to replace advice given to you by your health care provider. Make sure you discuss any questions you have with your health care provider. °Document Released: 01/30/2013 Document Revised: 03/02/2016 Document Reviewed: 03/02/2016 °Elsevier Interactive Patient Education © 2017 [employer] ° °

## 2018-03-14 ENCOUNTER — Ambulatory Visit
Admission: RE | Admit: 2018-03-14 | Discharge: 2018-03-14 | Disposition: A | Discharge status: Home / Self Care | Payer: Medicare Other | Source: Ambulatory Visit | Attending: Family Medicine | Admitting: Family Medicine

## 2018-03-14 DIAGNOSIS — Z1231 Encounter for screening mammogram for malignant neoplasm of breast: Secondary | ICD-10-CM

## 2018-04-15 NOTE — Progress Notes (Signed)
Patient ID: Makayla Fisher, female   DOB: [birth year], 84 y.o.   MRN: [id number]    84 y.o.  Initially seen  for preop clearance in 2751  Has follicular large cell lymphoma of abdomen.   Distant history of CAD  With stent /CABG in 2009 [facility]  Has had bradycardia on higher dose beta blocker   Echo done 3/15 for chemo in [place] showed normal EF , abnormal relaxation and mild MR  Reviewed  05/25/15  carotid duplex reviewed  40-59% RICA stenosis.    06/13/15   Had colostomy revision for stricture with slow recovery but no cardiac complications by [name] Sees Dr Marin Olp has maintenance Rituxan completed 09/2016 and gets IV iron for malabsorption   Memory issues and statin stopped   Complains of fatigue No recent thyroid in Epic    ROS: Denies fever, malais, weight loss, blurry vision, decreased visual acuity, cough, sputum, SOB, hemoptysis, pleuritic pain, palpitaitons, heartburn, abdominal pain, melena, lower extremity edema, claudication, or rash.  All other systems reviewed and negative   General: Affect appropriate Elderly spry female  HEENT: normal Neck supple with no adenopathy JVP normal no bruits no thyromegaly Lungs diffuse crackles no wheezing and good diaphragmatic motion Heart:  S1/S2 moderate AS murmur  no ,rub, gallop or click previous sternotomy PMI normal Abdomen: benighn, BS positve, no tenderness, no AAA ostomy with bag LLQ no bruit.  No HSM or HJR Distal pulses intact with no bruits No edema Neuro non-focal Skin warm and dry No muscular weakness Right sided porta cath remains   Medications Current Outpatient Medications  Medication Sig Dispense Refill  . acetaminophen (TYLENOL 8 HOUR) 650 MG CR tablet Take 1 tablet (650 mg total) by mouth every 8 (eight) hours as needed for pain. 30 tablet 0  . aspirin EC 81 MG EC tablet Take 1 tablet (81 mg total) by mouth daily.    . Calcium Carbonate-Vitamin D (CALCIUM 600+D) 600-400  MG-UNIT tablet Take 1 tablet by mouth daily.    . Cranberry 425 MG CAPS Take 425 mg by mouth daily.     Marland Kitchen levothyroxine (SYNTHROID, LEVOTHROID) 50 MCG tablet Take 50 mcg by mouth every morning.    Marland Kitchen lisinopril (PRINIVIL,ZESTRIL) 20 MG tablet Take 20 mg by mouth daily. Take 1/2 tablet, 10 mg daily.    Marland Kitchen loperamide (IMODIUM) 2 MG capsule Take 1 capsule (2 mg total) by mouth every 4 (four) hours as needed for diarrhea or loose stools. 1 tablet with every unformed stool up to 6 doses per day 20 capsule 0  . LORazepam (ATIVAN) 0.5 MG tablet Take 1 tablet (0.5 mg total) by mouth 2 (two) times daily as needed for anxiety. 10 tablet 0  . Multiple Vitamin (MULTIVITAMIN WITH MINERALS) TABS tablet Take 1 tablet by mouth daily.    . Multiple Vitamins-Minerals (RA VISION-VITE PRESERVE PO) Take 1 tablet by mouth 2 (two) times daily.     . ondansetron (ZOFRAN ODT) 4 MG disintegrating tablet Take 1 tablet (4 mg total) by mouth every 8 (eight) hours as needed for nausea or vomiting. 20 tablet 0  . psyllium (METAMUCIL) 58.6 % packet Take 1 packet by mouth 3 (three) times a week.    Marland Kitchen rOPINIRole (REQUIP) 1 MG tablet Take 1 mg by mouth at bedtime.    . Saccharomyces boulardii (PROBIOTIC) 250 MG CAPS Take 250 mg by mouth 2 (two) times daily. 14 capsule 0  . sertraline (ZOLOFT) 25 MG tablet Take  25 mg by mouth every morning.     Marland Kitchen SPIRIVA HANDIHALER 18 MCG inhalation capsule Place 18 mcg into inhaler and inhale daily.      No current facility-administered medications for this visit.     Allergies Epipen [epinephrine]; Heparin; Levaquin [levofloxacin in d5w]; Chlorhexidine gluconate; and Sulfa antibiotics  Family History: Family History  Problem Relation Age of Onset  . Diverticulitis Mother   . Heart attack Mother   . Ulcers Father     Social History: Social History   Socioeconomic History  . Marital status: Widowed    Spouse name: Not on file  . Number of children: Not on file  . Years of education:  Not on file  . Highest education level: Not on file  Occupational History  . Not on file  Social Needs  . Financial resource strain: Not on file  . Food insecurity:    Worry: Not on file    Inability: Not on file  . Transportation needs:    Medical: Not on file    Non-medical: Not on file  Tobacco Use  . Smoking status: Former Smoker    Packs/day: 1.00    Years: 25.00    Pack years: 25.00    Types: Cigarettes    Start date: 05/16/1948    Last attempt to quit: 05/17/1963    Years since quitting: 54.9  . Smokeless tobacco: Never Used  . Tobacco comment: quit smoking 50 years ago  Substance and Sexual Activity  . Alcohol use: No    Alcohol/week: 0.0 standard drinks  . Drug use: No  . Sexual activity: Not on file  Lifestyle  . Physical activity:    Days per week: Not on file    Minutes per session: Not on file  . Stress: Not on file  Relationships  . Social connections:    Talks on phone: Not on file    Gets together: Not on file    Attends religious service: Not on file    Active member of club or organization: Not on file    Attends meetings of clubs or organizations: Not on file    Relationship status: Not on file  . Intimate partner violence:    Fear of current or ex partner: Not on file    Emotionally abused: Not on file    Physically abused: Not on file    Forced sexual activity: Not on file  Other Topics Concern  . Not on file  Social History Narrative  . Not on file    Past Surgical History:  Procedure Laterality Date  . c section  1947, 1963  . CARDIAC SURGERY  2009  . CHOLECYSTECTOMY  1973  . COLON SURGERY  2014   Colostomy  . COLOSTOMY REVISION N/A 06/12/2014   Procedure: COLOSTOMY REVISION;  Surgeon: [name], MD;  Location: [facility];  Service: General;  Laterality: N/A;  . CORONARY ANGIOPLASTY WITH STENT PLACEMENT  2009  . CORONARY ARTERY BYPASS GRAFT  aug 2009    x3  . ESOPHAGOGASTRODUODENOSCOPY  2013  . EYE SURGERY Bilateral  10 years ago    lens replacments cataracts  . right chest pac  june 2015    Past Medical History:  Diagnosis Date  . GERD (gastroesophageal reflux disease)   . Heart attack ([place]) 2009  . History of blood transfusion 2009  . Iron deficiency anemia due to chronic blood loss 08/07/2014  . NHL (non-Hodgkin's lymphoma) ([place]) 10/18/2013   finished chemo dec 2015,  maintenanance retuxin    Electrocardiogram:  4/27  SR rate 71  LAE no old MI  Low voltage  SB rate 53  Otherwise normal ECG  05/05/16  SR normal ECG   Assessment and Plan CAD:  Stable with no angina and good activity level.  Continue medical Rx Distant CABG in 2009 HTN:  Well controlled.  Continue current medications and low sodium Dash type diet.   Chol:  Statin stopped due to memory issues and age   GI:  Good result with colostomy revision no cardiac complication with surgery  Bradycardia:  Improved with lower dose beta blocker no indication for pacer  Carotid:  Reviewed duplex 05/01/17  1-39% ICA bilateral no routine f/u scanning  Murmur: has significant AS murmur f/u TTE ordered Did discuss TAVR with patient although at her age would be a stretch   F/u with me in  a year   Jenkins Rouge

## 2018-04-20 ENCOUNTER — Ambulatory Visit (INDEPENDENT_AMBULATORY_CARE_PROVIDER_SITE_OTHER): Payer: Medicare Other | Admitting: Cardiovascular Disease

## 2018-04-20 ENCOUNTER — Encounter: Payer: Self-pay | Admitting: Cardiovascular Disease

## 2018-04-20 ENCOUNTER — Encounter (INDEPENDENT_AMBULATORY_CARE_PROVIDER_SITE_OTHER): Payer: Self-pay

## 2018-04-20 VITALS — BP 146/82 | HR 70 | Ht 61.0 in | Wt 182.0 lb

## 2018-04-20 DIAGNOSIS — I739 Peripheral vascular disease, unspecified: Secondary | ICD-10-CM | POA: Diagnosis not present

## 2018-04-20 DIAGNOSIS — E785 Hyperlipidemia, unspecified: Secondary | ICD-10-CM | POA: Diagnosis not present

## 2018-04-20 DIAGNOSIS — I2510 Atherosclerotic heart disease of native coronary artery without angina pectoris: Secondary | ICD-10-CM | POA: Diagnosis not present

## 2018-04-20 DIAGNOSIS — I779 Disorder of arteries and arterioles, unspecified: Secondary | ICD-10-CM

## 2018-04-20 DIAGNOSIS — I2583 Coronary atherosclerosis due to lipid rich plaque: Secondary | ICD-10-CM | POA: Diagnosis not present

## 2018-04-20 DIAGNOSIS — I10 Essential (primary) hypertension: Secondary | ICD-10-CM

## 2018-04-20 DIAGNOSIS — I350 Nonrheumatic aortic (valve) stenosis: Secondary | ICD-10-CM

## 2018-04-20 DIAGNOSIS — R011 Cardiac murmur, unspecified: Secondary | ICD-10-CM

## 2018-04-20 NOTE — Patient Instructions (Addendum)
Medication Instructions:   If you need a refill on your cardiac medications before your next appointment, please call your pharmacy.   Lab work:  If you have labs (blood work) drawn today and your tests are completely normal, you will receive your results only by: Marland Kitchen MyChart Message (if you have MyChart) OR . A paper copy in the mail If you have any lab test that is abnormal or we need to change your treatment, we will call you to review the results.  Testing/Procedures: Your physician has requested that you have an echocardiogram. Echocardiography is a painless test that uses sound waves to create images of your heart. It provides your doctor with information about the size and shape of your heart and how well your heart's chambers and valves are working. This procedure takes approximately one hour. There are no restrictions for this procedure.   Follow-Up: At [facility], you and your health needs are our priority.  As part of our continuing mission to provide you with exceptional heart care, we have created designated Provider Care Teams.  These Care Teams include your primary Cardiologist (physician) and Advanced Practice Providers (APPs -  Physician Assistants and Nurse Practitioners) who all work together to provide you with the care you need, when you need it. You will need a follow up appointment in 6  months.  Please call our office 2 months in advance to schedule this appointment.  You may see Dr. Johnsie Cancel or one of the following Advanced Practice Providers on your designated Care Team:   Truitt Merle, NP Cecilie Kicks, NP . Kathyrn Drown, NP

## 2018-05-01 ENCOUNTER — Ambulatory Visit (HOSPITAL_COMMUNITY): Payer: Medicare Other | Attending: Cardiology

## 2018-05-01 ENCOUNTER — Other Ambulatory Visit: Payer: Self-pay

## 2018-05-01 DIAGNOSIS — I350 Nonrheumatic aortic (valve) stenosis: Secondary | ICD-10-CM | POA: Diagnosis present

## 2018-05-10 ENCOUNTER — Inpatient Hospital Stay: Payer: Medicare Other

## 2018-05-10 ENCOUNTER — Other Ambulatory Visit: Payer: Self-pay

## 2018-05-10 ENCOUNTER — Inpatient Hospital Stay: Payer: Medicare Other | Attending: Hematology & Oncology | Admitting: Hematology & Oncology

## 2018-05-10 ENCOUNTER — Encounter: Payer: Self-pay | Admitting: Hematology & Oncology

## 2018-05-10 VITALS — BP 178/70 | HR 88 | Temp 98.2°F | Resp 19 | Wt 183.0 lb

## 2018-05-10 DIAGNOSIS — C8233 Follicular lymphoma grade IIIa, intra-abdominal lymph nodes: Secondary | ICD-10-CM | POA: Diagnosis present

## 2018-05-10 DIAGNOSIS — D500 Iron deficiency anemia secondary to blood loss (chronic): Secondary | ICD-10-CM

## 2018-05-10 DIAGNOSIS — Z933 Colostomy status: Secondary | ICD-10-CM | POA: Insufficient documentation

## 2018-05-10 DIAGNOSIS — R197 Diarrhea, unspecified: Secondary | ICD-10-CM | POA: Insufficient documentation

## 2018-05-10 DIAGNOSIS — C8283 Other types of follicular lymphoma, intra-abdominal lymph nodes: Secondary | ICD-10-CM

## 2018-05-10 DIAGNOSIS — Z95828 Presence of other vascular implants and grafts: Secondary | ICD-10-CM

## 2018-05-10 DIAGNOSIS — D509 Iron deficiency anemia, unspecified: Secondary | ICD-10-CM | POA: Insufficient documentation

## 2018-05-10 DIAGNOSIS — Z79899 Other long term (current) drug therapy: Secondary | ICD-10-CM | POA: Insufficient documentation

## 2018-05-10 DIAGNOSIS — E039 Hypothyroidism, unspecified: Secondary | ICD-10-CM

## 2018-05-10 LAB — CMP (CANCER CENTER ONLY)
ALT: 8 U/L (ref 0–44)
AST: 14 U/L — ABNORMAL LOW (ref 15–41)
Albumin: 4.1 g/dL (ref 3.5–5.0)
Alkaline Phosphatase: 61 U/L (ref 38–126)
Anion gap: 8 (ref 5–15)
BUN: 20 mg/dL (ref 8–23)
CO2: 23 mmol/L (ref 22–32)
Calcium: 9.5 mg/dL (ref 8.9–10.3)
Chloride: 109 mmol/L (ref 98–111)
Creatinine: 0.93 mg/dL (ref 0.44–1.00)
GFR, Est AFR Am: >60 mL/min (ref >60)
GFR, Estimated: 53 mL/min — ABNORMAL LOW (ref >60)
Glucose, Bld: 100 mg/dL — ABNORMAL HIGH (ref 70–99)
Potassium: 4.3 mmol/L (ref 3.5–5.1)
Sodium: 140 mmol/L (ref 135–145)
Total Bilirubin: 0.4 mg/dL (ref 0.3–1.2)
Total Protein: 6.2 g/dL — ABNORMAL LOW (ref 6.5–8.1)

## 2018-05-10 LAB — CBC WITH DIFFERENTIAL (CANCER CENTER ONLY)
Abs Immature Granulocytes: 0.04 10*3/uL (ref 0.00–0.07)
BASOS PCT: 1 %
Basophils Absolute: 0.1 10*3/uL (ref 0.0–0.1)
Eosinophils Absolute: 0.2 10*3/uL (ref 0.0–0.5)
Eosinophils Relative: 3 %
HCT: 40.9 % (ref 36.0–46.0)
Hemoglobin: 13.1 g/dL (ref 12.0–15.0)
Immature Granulocytes: 1 %
Lymphocytes Relative: 29 %
Lymphs Abs: 2.1 10*3/uL (ref 0.7–4.0)
MCH: 31.8 pg (ref 26.0–34.0)
MCHC: 32 g/dL (ref 30.0–36.0)
MCV: 99.3 fL (ref 80.0–100.0)
Monocytes Absolute: 0.7 10*3/uL (ref 0.1–1.0)
Monocytes Relative: 10 %
Neutro Abs: 4.3 10*3/uL (ref 1.7–7.7)
Neutrophils Relative %: 56 %
Platelet Count: 149 10*3/uL — ABNORMAL LOW (ref 150–400)
RBC: 4.12 MIL/uL (ref 3.87–5.11)
RDW: 14.1 % (ref 11.5–15.5)
WBC Count: 7.5 10*3/uL (ref 4.0–10.5)
nRBC: 0 % (ref 0.0–0.2)

## 2018-05-10 MED ORDER — SODIUM CHLORIDE 0.9% FLUSH
10.0000 mL | INTRAVENOUS | Status: DC | PRN
  Administered 2018-05-10: 10 mL via INTRAVENOUS
  Filled 2018-05-10: qty 10

## 2018-05-10 MED ORDER — HEPARIN SOD (PORK) LOCK FLUSH 100 UNIT/ML IV SOLN
500.0000 [IU] | Freq: Once | INTRAVENOUS | Status: AC
  Administered 2018-05-10: 500 [IU] via INTRAVENOUS
  Filled 2018-05-10: qty 5

## 2018-05-10 MED ORDER — LISINOPRIL 20 MG PO TABS: 20.0000 mg | ORAL_TABLET | Freq: Every day | ORAL | 3 refills | Status: DC

## 2018-05-10 MED ORDER — LIDOCAINE-PRILOCAINE 2.5-2.5 % EX CREA: 1.0000 "application " | TOPICAL_CREAM | CUTANEOUS | 3 refills | Status: DC | PRN

## 2018-05-10 MED ORDER — PSYLLIUM 58.6 % PO PACK: PACK | ORAL | 4 refills | Status: DC

## 2018-05-10 NOTE — Progress Notes (Signed)
Hematology and Oncology Follow Up Visit  Makayla Fisher [id number] Apr 06, 1923 85 y.o. 05/10/2018   Principle Diagnosis:  Follicular large cell non-Hodgkin's lymphoma Iron deficiency anemia secondary to malabsorption  Current Therapy:   Status post cycle #8 of maintenance Rituxan - complete in June 2018 IV iron-Feraheme as indicated     Interim History:  Ms.  Makayla Fisher is back for followup.  She seems to be doing pretty well.  She is still at the assisted living facility.  Her problem is that she is having diarrhea.  She gets Metamucil 3 times a week.  This probably is too much for her.  I put an order in for the nursing home to give her Metamucil once a week.  She also is worried about her blood pressure.  She is on lisinopril.  She will take 20 mg daily.  She has the colostomy.  She is doing okay with her colostomy.  She has had no fever.  She is had no rashes.  There has been no leg swelling.  She has had no cough.  She has had no bleeding.  Overall, her performance status is ECOG 2.    Medications:  Current Outpatient Medications:  .  acetaminophen (TYLENOL 8 HOUR) 650 MG CR tablet, Take 1 tablet (650 mg total) by mouth every 8 (eight) hours as needed for pain., Disp: 30 tablet, Rfl: 0 .  aspirin EC 81 MG EC tablet, Take 1 tablet (81 mg total) by mouth daily., Disp: , Rfl:  .  Calcium Carbonate-Vitamin D (CALCIUM 600+D) 600-400 MG-UNIT tablet, Take 1 tablet by mouth daily., Disp: , Rfl:  .  Cranberry 425 MG CAPS, Take 425 mg by mouth daily. , Disp: , Rfl:  .  levothyroxine (SYNTHROID, LEVOTHROID) 50 MCG tablet, Take 50 mcg by mouth every morning., Disp: , Rfl:  .  lidocaine-prilocaine (EMLA) cream, Apply 1 application topically as needed. Apply to port a cath 1 hour prior to procedure., Disp: 30 g, Rfl: 3 .  lisinopril (PRINIVIL,ZESTRIL) 20 MG tablet, Take 20 mg by mouth daily. Take 1/2 tablet, 10 mg daily., Disp: , Rfl:  .  loperamide (IMODIUM) 2 MG capsule, Take 1 capsule  (2 mg total) by mouth every 4 (four) hours as needed for diarrhea or loose stools. 1 tablet with every unformed stool up to 6 doses per day, Disp: 20 capsule, Rfl: 0 .  LORazepam (ATIVAN) 0.5 MG tablet, Take 1 tablet (0.5 mg total) by mouth 2 (two) times daily as needed for anxiety., Disp: 10 tablet, Rfl: 0 .  Multiple Vitamin (MULTIVITAMIN WITH MINERALS) TABS tablet, Take 1 tablet by mouth daily., Disp: , Rfl:  .  Multiple Vitamins-Minerals (RA VISION-VITE PRESERVE PO), Take 1 tablet by mouth 2 (two) times daily. , Disp: , Rfl:  .  ondansetron (ZOFRAN ODT) 4 MG disintegrating tablet, Take 1 tablet (4 mg total) by mouth every 8 (eight) hours as needed for nausea or vomiting., Disp: 20 tablet, Rfl: 0 .  psyllium (METAMUCIL) 58.6 % packet, Take 1 packet by mouth 3 (three) times a week., Disp: , Rfl:  .  rOPINIRole (REQUIP) 1 MG tablet, Take 1 mg by mouth at bedtime., Disp: , Rfl:  .  Saccharomyces boulardii (PROBIOTIC) 250 MG CAPS, Take 250 mg by mouth 2 (two) times daily., Disp: 14 capsule, Rfl: 0 .  sertraline (ZOLOFT) 25 MG tablet, Take 25 mg by mouth every morning. , Disp: , Rfl:  .  SPIRIVA HANDIHALER 18 MCG inhalation capsule, Place 18 mcg into  inhaler and inhale daily. , Disp: , Rfl:   Allergies:  Allergies  Allergen Reactions  . Epipen [Epinephrine]     Heart palpitations  . Heparin     Unsure of side affects (maybe affected platelets per pt)  . Levaquin [Levofloxacin In D5w] Other (See Comments)    Weakness  . Chlorhexidine Gluconate Other (See Comments)    Pt declined use and suspects she may have an intolerance,   . Sulfa Antibiotics Rash    Past Medical History, Surgical history, Social history, and Family History were reviewed and updated.  Review of Systems: Review of Systems  Constitutional: Negative.   HENT: Negative.   Eyes: Negative.   Respiratory: Positive for shortness of breath.   Cardiovascular: Positive for palpitations.  Gastrointestinal: Positive for diarrhea  and nausea.  Genitourinary: Negative.   Musculoskeletal: Positive for joint pain and myalgias.  Skin: Negative.   Neurological: Positive for tingling.  Endo/Heme/Allergies: Negative.   Psychiatric/Behavioral: Negative.      Physical Exam:  weight is 183 lb (83 kg). Her oral temperature is 98.2 F (36.8 C). Her blood pressure is 178/70 (abnormal) and her pulse is 88. Her respiration is 19 and oxygen saturation is 99%.   Physical Exam Vitals signs reviewed.  HENT:     Head: Normocephalic and atraumatic.  Eyes:     Pupils: Pupils are equal, round, and reactive to light.  Neck:     Musculoskeletal: Normal range of motion.  Cardiovascular:     Rate and Rhythm: Normal rate and regular rhythm.     Heart sounds: Normal heart sounds.  Pulmonary:     Effort: Pulmonary effort is normal.     Breath sounds: Normal breath sounds.  Abdominal:     General: Bowel sounds are normal.     Palpations: Abdomen is soft.     Comments: Abdominal exam shows a colostomy in the left lower quadrant.  She has multiple surgical scars.  She has no fluid wave.  There is no guarding or rebound tenderness.  There is no palpable liver or spleen tip.  Musculoskeletal: Normal range of motion.        General: No tenderness or deformity.  Lymphadenopathy:     Cervical: No cervical adenopathy.  Skin:    General: Skin is warm and dry.     Findings: No erythema or rash.  Neurological:     Mental Status: She is alert and oriented to person, place, and time.  Psychiatric:        Behavior: Behavior normal.        Thought Content: Thought content normal.        Judgment: Judgment normal.      Lab Results  Component Value Date   WBC 7.5 05/10/2018   HGB 13.1 05/10/2018   HCT 40.9 05/10/2018   MCV 99.3 05/10/2018   PLT 149 (L) 05/10/2018     Chemistry      Component Value Date/Time   NA 140 05/10/2018 1435   NA 143 01/31/2017 0931   NA 141 05/08/2014 1041   K 4.3 05/10/2018 1435   K 3.7 01/31/2017  0931   K 4.5 05/08/2014 1041   CL 109 05/10/2018 1435   CL 106 01/31/2017 0931   CO2 23 05/10/2018 1435   CO2 27 01/31/2017 0931   CO2 24 05/08/2014 1041   BUN 20 05/10/2018 1435   BUN 10 01/31/2017 0931   BUN 14.2 05/08/2014 1041   CREATININE 0.93 05/10/2018 1435  CREATININE 0.9 01/31/2017 0931   CREATININE 0.8 05/08/2014 1041      Component Value Date/Time   CALCIUM 9.5 05/10/2018 1435   CALCIUM 9.7 01/31/2017 0931   CALCIUM 9.2 05/08/2014 1041   ALKPHOS 61 05/10/2018 1435   ALKPHOS 57 01/31/2017 0931   ALKPHOS 66 05/08/2014 1041   AST 14 (L) 05/10/2018 1435   AST 23 05/08/2014 1041   ALT 8 05/10/2018 1435   ALT 14 01/31/2017 0931   ALT 10 05/08/2014 1041   BILITOT 0.4 05/10/2018 1435   BILITOT 0.42 05/08/2014 1041      Impression and Plan: Ms. Drawdy is 85 year old female. She has a follicular large cell lymphoma. I do not see any evidence of recurrent disease. I do not believe that she is at significant risk for occurrence. Her labs look really good.  From my point of view, there is no problems with lymphoma.  It is been a year since she completed the maintenance Rituxan.  It has been 31/2 years since she completed the chemotherapy.  She had a Port-A-Cath flush today.  She will need a Port-A-Cath flush every 2 months.  I will plan to see her back in 4 more months.  Hopefully her blood pressure will be under better control when we see her back.  I think that cardiology is doing a great job in maintaining her cardiac health.  Of note, she just had an echocardiogram that was done which showed a very good ejection fraction of 60-65%.   [name], MD 1/16/20203:45 PM

## 2018-05-11 LAB — IRON AND TIBC
IRON: 58 ug/dL (ref 41–142)
Saturation Ratios: 22 % (ref 21–57)
TIBC: 261 ug/dL (ref 236–444)
UIBC: 204 ug/dL (ref 120–384)

## 2018-05-11 LAB — TSH: TSH: 3.636 u[IU]/mL (ref 0.308–3.960)

## 2018-05-11 LAB — FERRITIN: FERRITIN: 127 ng/mL (ref 11–307)

## 2018-05-11 LAB — LACTATE DEHYDROGENASE: LDH: 187 U/L (ref 98–192)

## 2018-05-14 ENCOUNTER — Telehealth: Payer: Self-pay | Admitting: *Deleted

## 2018-05-14 NOTE — Telephone Encounter (Signed)
As noted below by Dr. Marin Olp, I left a message informing her of her lab results. Instructed her to call the office if she has any questions or concerns.

## 2018-05-14 NOTE — Telephone Encounter (Signed)
-----   Message from [name], MD sent at 05/11/2018  4:27 PM EST ----- Call - iron level is ok!!  Laurey Arrow

## 2018-07-11 ENCOUNTER — Telehealth: Payer: Self-pay | Admitting: Hematology & Oncology

## 2018-07-11 NOTE — Telephone Encounter (Signed)
Called and spoke with patient regarding r/s her appointment.  She is ok with new date/time per 3/17 staff message

## 2018-08-08 ENCOUNTER — Telehealth: Payer: Self-pay | Admitting: *Deleted

## 2018-08-08 NOTE — Telephone Encounter (Signed)
Pt called to cancel port flush appt for tomorrow and states that she will keep scheduled appts on 09/06/18.

## 2018-08-29 IMAGING — CR DG CHEST 2V
2 series · 2 of 2 positions shown · non-contrast
Comparison: 12/14/2016

CLINICAL DATA: Fever and headache

EXAM:
CHEST  2 VIEW

[w chest lat]
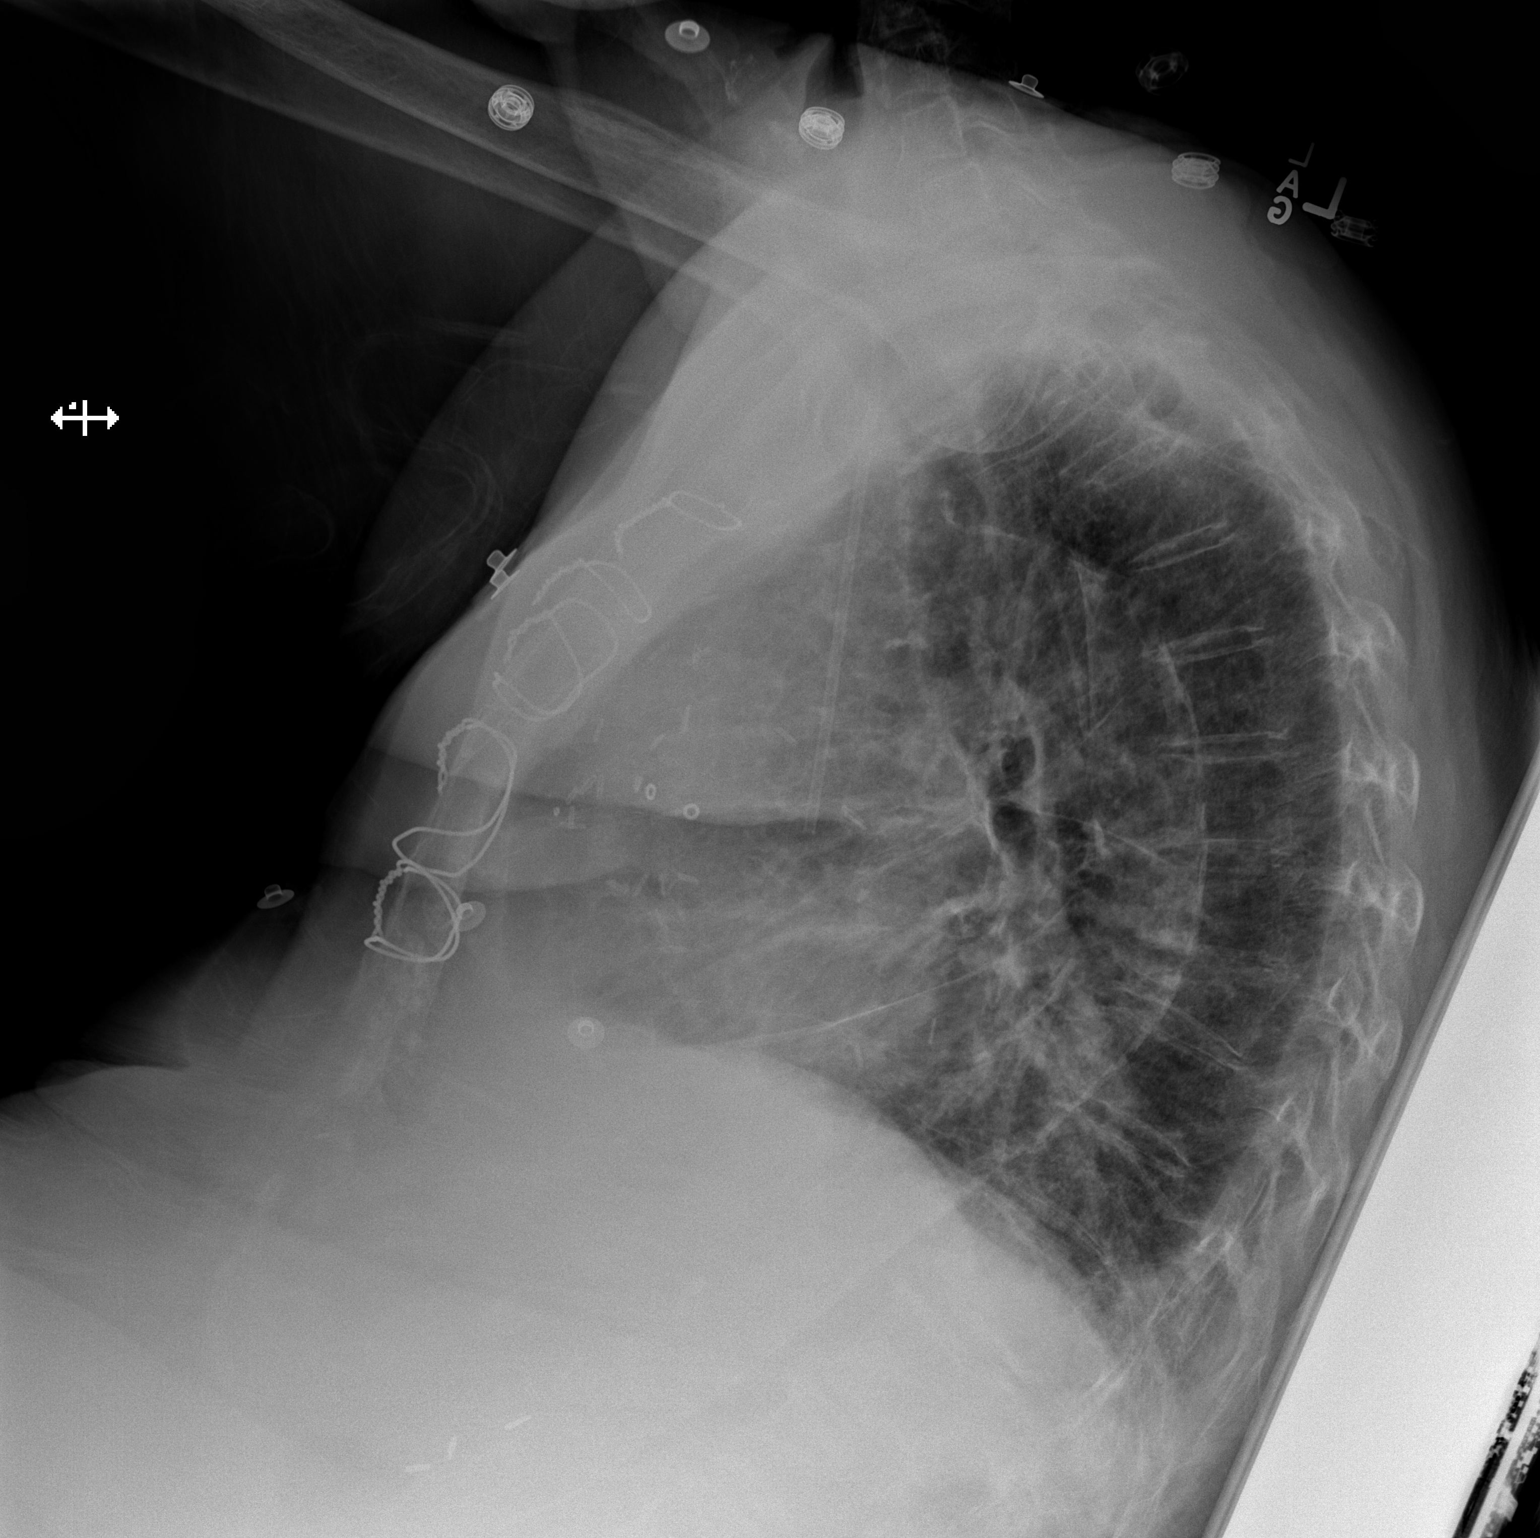

[x chest ap]
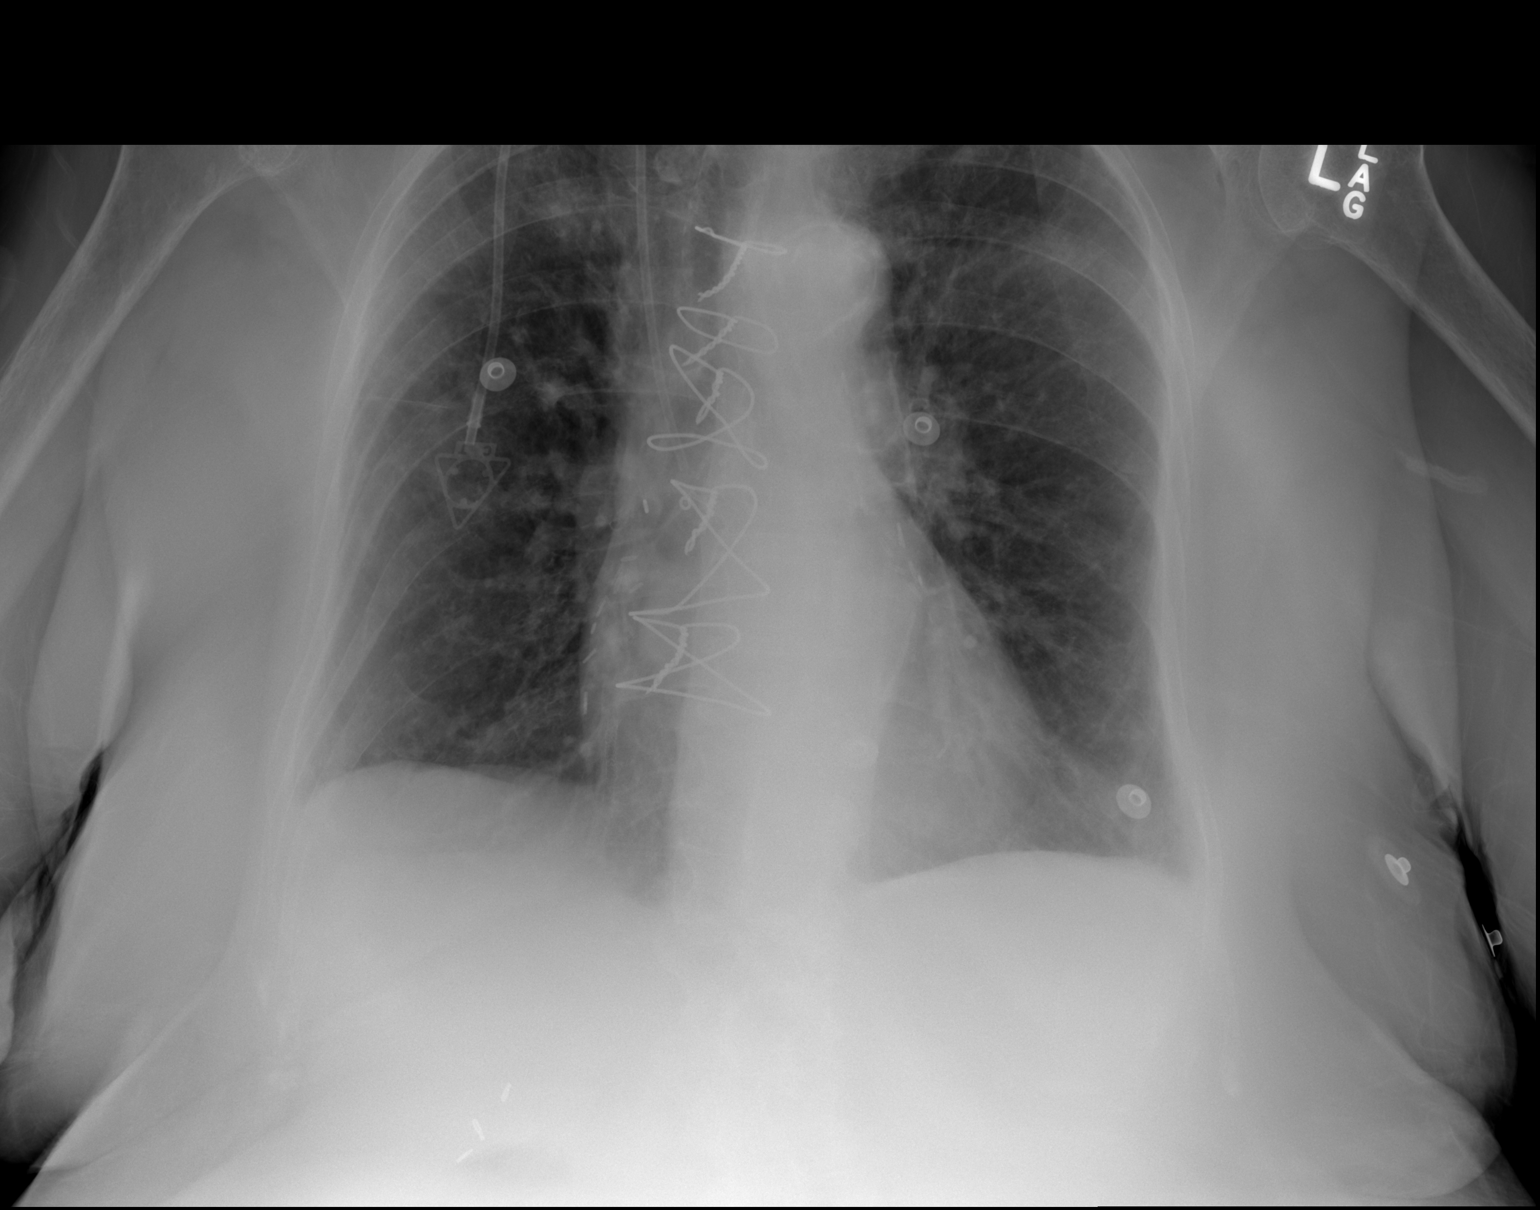

[2 of 2 positions shown; findings below may reference images not displayed]

FINDINGS: Right-sided central venous catheter tip overlies the SVC. Post
sternotomy changes. No pleural effusion. Mild upper lobe patchy
pulmonary opacity. Aortic atherosclerosis. No pneumothorax. Stable
moderate to marked compression deformity of lower thoracic vertebra.
Surgical clips in the right upper quadrant.
IMPRESSION: Vague upper lobe pulmonary opacity could reflect mild recurrent
infiltrates. Radiographic follow-up to resolution recommended.

## 2018-09-03 ENCOUNTER — Telehealth: Payer: Self-pay | Admitting: *Deleted

## 2018-09-03 NOTE — Telephone Encounter (Signed)
Patient called wanting to cancel her appt for this Thursday 09/06/2018 in light of the COVID crisis.  Sent to scheduler to reschedule for 1 month.  [place] with Dr. Marin Olp

## 2018-09-04 ENCOUNTER — Telehealth: Payer: Self-pay | Admitting: Hematology & Oncology

## 2018-09-04 NOTE — Telephone Encounter (Signed)
Appts scheduled letter/calendar mailed per 5/11 sch msg

## 2018-09-05 ENCOUNTER — Telehealth: Payer: Self-pay | Admitting: Hematology & Oncology

## 2018-09-05 NOTE — Telephone Encounter (Signed)
Appts scheduled letter/calendar mailed per 5/12 los °

## 2018-09-06 ENCOUNTER — Other Ambulatory Visit: Payer: Medicare Other

## 2018-09-06 ENCOUNTER — Ambulatory Visit: Payer: Medicare Other | Admitting: Hematology & Oncology

## 2018-09-12 ENCOUNTER — Telehealth: Payer: Self-pay | Admitting: Cardiovascular Disease

## 2018-09-12 NOTE — Telephone Encounter (Signed)
New message     LMOM to let pt know that we are not seeing patients in the office.  Calling to convert her 09-25-18 ov to a phone visit.  Asked patient to return call.

## 2018-09-20 NOTE — Progress Notes (Signed)
Virtual Visit via Telephone Note   This visit type was conducted due to national recommendations for restrictions regarding the COVID-19 Pandemic (e.g. social distancing) in an effort to limit this patient's exposure and mitigate transmission in our community.  Due to her co-morbid illnesses, this patient is at least at moderate risk for complications without adequate follow up.  This format is felt to be most appropriate for this patient at this time.  The patient did not have access to video technology/had technical difficulties with video requiring transitioning to audio format only (telephone).  All issues noted in this document were discussed and addressed.  No physical exam could be performed with this format.  Please refer to the patient's chart for her  consent to telehealth for [facility].   Date:  09/25/2018   ID:  Makayla Fisher, DOB 1922-09-20, MRN [id number]  Patient Location: Home Provider Location: Office  PCP:  [name], DO  Cardiologist:   Johnsie Cancel Electrophysiologist:  None   Evaluation Performed:  Follow-Up Visit  Chief Complaint:  CAD  History of Present Illness:    85 y.o.  Initially seen  for preop clearance in 1941  Has follicular large cell lymphoma of abdomen.   Distant history of CAD  With stent /CABG in [facility]  Has had bradycardia on higher dose beta blocker   05/01/18 echo normal EF stable moderate MR  05/25/15  carotid duplex reviewed  40-59% RICA stenosis.    06/13/15   Had colostomy revision for stricture with slow recovery but no cardiac complications by [name] Sees Dr Marin Olp has maintenance Rituxan completed 09/2016 and gets IV iron for malabsorption   Memory issues and statin stopped   Complains of fatigue TSH 3.6 05/10/18 Hct 40.9  Needs to go to [facility] this week to get port flushed Told her she should be safe to do this  The patient  does not have symptoms concerning for COVID-19 infection (fever,  chills, cough, or new shortness of breath).    Past Medical History:  Diagnosis Date  . GERD (gastroesophageal reflux disease)   . Heart attack ([place]) 2009  . History of blood transfusion 2009  . Iron deficiency anemia due to chronic blood loss 08/07/2014  . NHL (non-Hodgkin's lymphoma) ([place]) 10/18/2013   finished chemo dec 2015, maintenanance retuxin   Past Surgical History:  Procedure Laterality Date  . c section  1947, 1963  . CARDIAC SURGERY  2009  . CHOLECYSTECTOMY  1973  . COLON SURGERY  2014   Colostomy  . COLOSTOMY REVISION N/A 06/12/2014   Procedure: COLOSTOMY REVISION;  Surgeon: [name], MD;  Location: [facility] ORS;  Service: General;  Laterality: N/A;  . CORONARY ANGIOPLASTY WITH STENT PLACEMENT  2009  . CORONARY ARTERY BYPASS GRAFT  aug 2009    x3  . ESOPHAGOGASTRODUODENOSCOPY  2013  . EYE SURGERY Bilateral  10 years ago   lens replacments cataracts  . right chest pac  june 2015     Current Meds  Medication Sig  . acetaminophen (TYLENOL 8 HOUR) 650 MG CR tablet Take 1 tablet (650 mg total) by mouth every 8 (eight) hours as needed for pain.  Marland Kitchen aspirin EC 81 MG EC tablet Take 1 tablet (81 mg total) by mouth daily.  . Calcium Carbonate-Vitamin D (CALCIUM 600+D) 600-400 MG-UNIT tablet Take 1 tablet by mouth daily.  . Cranberry 425 MG CAPS Take 425 mg by mouth daily.   Marland Kitchen levothyroxine (SYNTHROID, LEVOTHROID) 50  MCG tablet Take 50 mcg by mouth every morning.  . lidocaine-prilocaine (EMLA) cream Apply 1 application topically as needed. Apply to port a cath 1 hour prior to procedure.  Marland Kitchen lisinopril (PRINIVIL,ZESTRIL) 20 MG tablet Take 1 tablet (20 mg total) by mouth daily.  Marland Kitchen loperamide (IMODIUM) 2 MG capsule Take 1 capsule (2 mg total) by mouth every 4 (four) hours as needed for diarrhea or loose stools. 1 tablet with every unformed stool up to 6 doses per day  . LORazepam (ATIVAN) 0.5 MG tablet Take 1 tablet (0.5 mg total) by mouth 2 (two) times daily as needed for anxiety.   . Multiple Vitamin (MULTIVITAMIN WITH MINERALS) TABS tablet Take 1 tablet by mouth daily.  . Multiple Vitamins-Minerals (RA VISION-VITE PRESERVE PO) Take 1 tablet by mouth 2 (two) times daily.   . ondansetron (ZOFRAN ODT) 4 MG disintegrating tablet Take 1 tablet (4 mg total) by mouth every 8 (eight) hours as needed for nausea or vomiting.  . psyllium (METAMUCIL) 58.6 % packet Take 1 packet weekly  . rOPINIRole (REQUIP) 1 MG tablet Take 1 mg by mouth at bedtime.  . Saccharomyces boulardii (PROBIOTIC) 250 MG CAPS Take 250 mg by mouth 2 (two) times daily.  . sertraline (ZOLOFT) 25 MG tablet Take 25 mg by mouth every morning.   Marland Kitchen SPIRIVA HANDIHALER 18 MCG inhalation capsule Place 18 mcg into inhaler and inhale daily.      Allergies:   Epipen [epinephrine]; Heparin; Levaquin [levofloxacin in d5w]; Chlorhexidine gluconate; and Sulfa antibiotics   Social History   Tobacco Use  . Smoking status: Former Smoker    Packs/day: 1.00    Years: 25.00    Pack years: 25.00    Types: Cigarettes    Start date: 05/16/1948    Last attempt to quit: 05/17/1963    Years since quitting: 55.3  . Smokeless tobacco: Never Used  . Tobacco comment: quit smoking 50 years ago  Substance Use Topics  . Alcohol use: No    Alcohol/week: 0.0 standard drinks  . Drug use: No     Family Hx: The patient's family history includes Diverticulitis in her mother; Heart attack in her mother; Ulcers in her father.  ROS:   Please see the history of present illness.     All other systems reviewed and are negative.   Prior CV studies:   The following studies were reviewed today:  Echo 05/01/18   Labs/Other Tests and Data Reviewed:    EKG: SR rate 70 normal   Recent Labs: 05/10/2018: ALT 8; BUN 20; Creatinine 0.93; Hemoglobin 13.1; Platelet Count 149; Potassium 4.3; Sodium 140; TSH 3.636   Recent Lipid Panel Lab Results  Component Value Date/Time   CHOL 189 05/01/2017 05:20 AM   TRIG 71 05/01/2017 05:20 AM   HDL 62  05/01/2017 05:20 AM   CHOLHDL 3.0 05/01/2017 05:20 AM   LDLCALC 113 (H) 05/01/2017 05:20 AM    Wt Readings from Last 3 Encounters:  09/25/18 86.2 kg  05/10/18 83 kg  04/20/18 82.6 kg     Objective:    Vital Signs:  BP (!) 166/88   Ht 5\' 2"  (1.575 m)   Wt 86.2 kg   BMI 34.75 kg/m    Telephone visit no exam   ASSESSMENT & PLAN:    CAD:  Stable with no angina and good activity level.  Continue medical Rx Distant CABG in 2009 HTN:  Well controlled.  Continue current medications and low sodium Dash type diet.  Chol:  Statin stopped due to memory issues and age   GI:  Good result with colostomy revision no cardiac complication with surgery  Bradycardia:  Improved off beta blocker no indication for pacer  Carotid:  Reviewed duplex 05/01/17  1-39% ICA bilateral no routine f/u scanning  Murmur: moderate MR stable by echo 05/01/18  Hematology:  F/U ennever history of follicular large cell lymphoma and anemia continue Rituxan and Fereheme Encouraged her to get port flushed this week to keep it from clotting   COVID-19 Education: The signs and symptoms of COVID-19 were discussed with the patient and how to seek care for testing (follow up with PCP or arrange E-visit).  The importance of social distancing was discussed today.  Time:   Today, I have spent 30 minutes with the patient with telehealth technology discussing the above problems.     Medication Adjustments/Labs and Tests Ordered: Current medicines are reviewed at length with the patient today.  Concerns regarding medicines are outlined above.   Tests Ordered:  None   Medication Changes:  None   Disposition:  Follow up in 6 months   Signed, Jenkins Rouge, MD  09/25/2018 1:59 PM    [facility]

## 2018-09-20 NOTE — Telephone Encounter (Signed)
Virtual Visit Pre-Appointment Phone Call  "(Name), I am calling you today to discuss your upcoming appointment. We are currently trying to limit exposure to the virus that causes COVID-19 by seeing patients at home rather than in the office."  1. "What is the BEST phone number to call the day of the visit?" - include this in appointment notes  2. "Do you have or have access to (through a family member/friend) a smartphone with video capability that we can use for your visit?" a. If yes - list this number in appt notes as "cell" (if different from BEST phone #) and list the appointment type as a VIDEO visit in appointment notes b. If no - list the appointment type as a PHONE visit in appointment notes  3. Confirm consent - "In the setting of the current Covid19 crisis, you are scheduled for a (phone or video) visit with your provider on (date) at (time).  Just as we do with many in-office visits, in order for you to participate in this visit, we must obtain consent.  If you'd like, I can send this to your mychart (if signed up) or email for you to review.  Otherwise, I can obtain your verbal consent now.  All virtual visits are billed to your insurance company just like a normal visit would be.  By agreeing to a virtual visit, we'd like you to understand that the technology does not allow for your provider to perform an examination, and thus may limit your provider's ability to fully assess your condition. If your provider identifies any concerns that need to be evaluated in person, we will make arrangements to do so.  Finally, though the technology is pretty good, we cannot assure that it will always work on either your or our end, and in the setting of a video visit, we may have to convert it to a phone-only visit.  In either situation, we cannot ensure that we have a secure connection.  Are you willing to proceed? Yes  4. Advise patient to be prepared - "Two hours prior to your appointment, go  ahead and check your blood pressure, pulse, oxygen saturation, and your weight (if you have the equipment to check those) and write them all down. When your visit starts, your provider will ask you for this information. If you have an Apple Watch or Kardia device, please plan to have heart rate information ready on the day of your appointment. Please have a pen and paper handy nearby the day of the visit as well."  5. Give patient instructions for MyChart download to smartphone OR [email or web address] as below if video visit (depending on what platform provider is using)  6. Inform patient they will receive a phone call 15 minutes prior to their appointment time (may be from unknown caller ID) so they should be prepared to answer    TELEPHONE CALL NOTE  Makayla Fisher has been deemed a candidate for a follow-up tele-health visit to limit community exposure during the Covid-19 pandemic. I spoke with the patient via phone to ensure availability of phone/video source, confirm preferred email & phone number, and discuss instructions and expectations.  I reminded Makayla Fisher to be prepared with any vital sign and/or heart rhythm information that could potentially be obtained via home monitoring, at the time of her visit. I reminded Makayla Fisher to expect a phone call prior to her visit.  Makayla Barter, RN 09/20/2018 1:32 PM     FULL LENGTH  CONSENT FOR TELE-HEALTH VISIT   I hereby voluntarily request, consent and authorize [facility] and its employed or contracted physicians, physician assistants, nurse practitioners or other licensed health care professionals (the Practitioner), to provide me with telemedicine health care services (the "Services") as deemed necessary by the treating Practitioner. I acknowledge and consent to receive the Services by the Practitioner via telemedicine. I understand that the telemedicine visit will involve communicating with the Practitioner through live  audiovisual communication technology and the disclosure of certain medical information by electronic transmission. I acknowledge that I have been given the opportunity to request an in-person assessment or other available alternative prior to the telemedicine visit and am voluntarily participating in the telemedicine visit.  I understand that I have the right to withhold or withdraw my consent to the use of telemedicine in the course of my care at any time, without affecting my right to future care or treatment, and that the Practitioner or I may terminate the telemedicine visit at any time. I understand that I have the right to inspect all information obtained and/or recorded in the course of the telemedicine visit and may receive copies of available information for a reasonable fee.  I understand that some of the potential risks of receiving the Services via telemedicine include:  Marland Kitchen Delay or interruption in medical evaluation due to technological equipment failure or disruption; . Information transmitted may not be sufficient (e.g. poor resolution of images) to allow for appropriate medical decision making by the Practitioner; and/or  . In rare instances, security protocols could fail, causing a breach of personal health information.  Furthermore, I acknowledge that it is my responsibility to provide information about my medical history, conditions and care that is complete and accurate to the best of my ability. I acknowledge that Practitioner's advice, recommendations, and/or decision may be based on factors not within their control, such as incomplete or inaccurate data provided by me or distortions of diagnostic images or specimens that may result from electronic transmissions. I understand that the practice of medicine is not an exact science and that Practitioner makes no warranties or guarantees regarding treatment outcomes. I acknowledge that I will receive a copy of this consent concurrently upon  execution via email to the email address I last provided but may also request a printed copy by calling the office of [name].    I understand that my insurance will be billed for this visit.   I have read or had this consent read to me. . I understand the contents of this consent, which adequately explains the benefits and risks of the Services being provided via telemedicine.  . I have been provided ample opportunity to ask questions regarding this consent and the Services and have had my questions answered to my satisfaction. . I give my informed consent for the services to be provided through the use of telemedicine in my medical care  By participating in this telemedicine visit I agree to the above.

## 2018-09-25 ENCOUNTER — Other Ambulatory Visit: Payer: Self-pay

## 2018-09-25 ENCOUNTER — Telehealth (INDEPENDENT_AMBULATORY_CARE_PROVIDER_SITE_OTHER): Payer: Medicare Other | Admitting: Cardiovascular Disease

## 2018-09-25 ENCOUNTER — Ambulatory Visit: Payer: Medicare Other | Admitting: Cardiovascular Disease

## 2018-09-25 ENCOUNTER — Encounter: Payer: Self-pay | Admitting: Cardiovascular Disease

## 2018-09-25 VITALS — BP 166/88 | Ht 62.0 in | Wt 190.0 lb

## 2018-09-25 DIAGNOSIS — I2510 Atherosclerotic heart disease of native coronary artery without angina pectoris: Secondary | ICD-10-CM | POA: Diagnosis not present

## 2018-09-25 DIAGNOSIS — I340 Nonrheumatic mitral (valve) insufficiency: Secondary | ICD-10-CM

## 2018-09-25 NOTE — Patient Instructions (Addendum)

## 2018-10-08 ENCOUNTER — Inpatient Hospital Stay: Payer: Medicare Other

## 2018-10-08 ENCOUNTER — Inpatient Hospital Stay: Payer: Medicare Other | Attending: Hematology & Oncology

## 2018-10-08 ENCOUNTER — Inpatient Hospital Stay: Payer: Medicare Other | Admitting: Hematology & Oncology

## 2018-11-27 ENCOUNTER — Ambulatory Visit: Payer: Medicare Other | Admitting: Family Medicine

## 2018-11-27 ENCOUNTER — Encounter

## 2019-01-03 ENCOUNTER — Telehealth: Payer: Self-pay | Admitting: Hematology & Oncology

## 2019-01-03 NOTE — Telephone Encounter (Signed)
Mom to inform patient of Oct appts per 9/8 sch msg

## 2019-01-07 ENCOUNTER — Telehealth: Payer: Self-pay | Admitting: *Deleted

## 2019-01-07 NOTE — Telephone Encounter (Signed)
Message received from patient requesting a call back.  Call placed back to patient and pt wanted her October appts reviewed with her.  October appts reviewed with patient.  Patient appreciative of call back and has no further questions or concerns at this time.

## 2019-02-07 ENCOUNTER — Inpatient Hospital Stay (HOSPITAL_BASED_OUTPATIENT_CLINIC_OR_DEPARTMENT_OTHER): Payer: Medicare Other | Admitting: Hematology & Oncology

## 2019-02-07 ENCOUNTER — Inpatient Hospital Stay: Payer: Medicare Other

## 2019-02-07 ENCOUNTER — Encounter: Payer: Self-pay | Admitting: Hematology & Oncology

## 2019-02-07 ENCOUNTER — Telehealth: Payer: Self-pay | Admitting: Hematology & Oncology

## 2019-02-07 ENCOUNTER — Other Ambulatory Visit: Payer: Self-pay

## 2019-02-07 ENCOUNTER — Inpatient Hospital Stay: Payer: Medicare Other | Attending: Hematology & Oncology

## 2019-02-07 VITALS — BP 158/78 | HR 67 | Temp 97.3°F | Resp 19 | Wt 185.0 lb

## 2019-02-07 DIAGNOSIS — K909 Intestinal malabsorption, unspecified: Secondary | ICD-10-CM | POA: Insufficient documentation

## 2019-02-07 DIAGNOSIS — Z8572 Personal history of non-Hodgkin lymphomas: Secondary | ICD-10-CM | POA: Insufficient documentation

## 2019-02-07 DIAGNOSIS — Z23 Encounter for immunization: Secondary | ICD-10-CM

## 2019-02-07 DIAGNOSIS — E039 Hypothyroidism, unspecified: Secondary | ICD-10-CM

## 2019-02-07 DIAGNOSIS — Z452 Encounter for adjustment and management of vascular access device: Secondary | ICD-10-CM | POA: Insufficient documentation

## 2019-02-07 DIAGNOSIS — Z7982 Long term (current) use of aspirin: Secondary | ICD-10-CM | POA: Insufficient documentation

## 2019-02-07 DIAGNOSIS — C8283 Other types of follicular lymphoma, intra-abdominal lymph nodes: Secondary | ICD-10-CM

## 2019-02-07 DIAGNOSIS — D500 Iron deficiency anemia secondary to blood loss (chronic): Secondary | ICD-10-CM | POA: Diagnosis not present

## 2019-02-07 DIAGNOSIS — Z9221 Personal history of antineoplastic chemotherapy: Secondary | ICD-10-CM | POA: Insufficient documentation

## 2019-02-07 DIAGNOSIS — E032 Hypothyroidism due to medicaments and other exogenous substances: Secondary | ICD-10-CM

## 2019-02-07 DIAGNOSIS — C8203 Follicular lymphoma grade I, intra-abdominal lymph nodes: Secondary | ICD-10-CM | POA: Diagnosis not present

## 2019-02-07 DIAGNOSIS — Z95828 Presence of other vascular implants and grafts: Secondary | ICD-10-CM

## 2019-02-07 DIAGNOSIS — Z79899 Other long term (current) drug therapy: Secondary | ICD-10-CM | POA: Diagnosis not present

## 2019-02-07 DIAGNOSIS — I2510 Atherosclerotic heart disease of native coronary artery without angina pectoris: Secondary | ICD-10-CM

## 2019-02-07 DIAGNOSIS — D508 Other iron deficiency anemias: Secondary | ICD-10-CM | POA: Insufficient documentation

## 2019-02-07 LAB — CBC WITH DIFFERENTIAL (CANCER CENTER ONLY)
Abs Immature Granulocytes: 0.04 10*3/uL (ref 0.00–0.07)
Basophils Absolute: 0.1 10*3/uL (ref 0.0–0.1)
Basophils Relative: 1 %
Eosinophils Absolute: 0.3 10*3/uL (ref 0.0–0.5)
Eosinophils Relative: 3 %
HCT: 40.4 % (ref 36.0–46.0)
Hemoglobin: 12.9 g/dL (ref 12.0–15.0)
Immature Granulocytes: 1 %
Lymphocytes Relative: 26 %
Lymphs Abs: 2 10*3/uL (ref 0.7–4.0)
MCH: 31.2 pg (ref 26.0–34.0)
MCHC: 31.9 g/dL (ref 30.0–36.0)
MCV: 97.6 fL (ref 80.0–100.0)
Monocytes Absolute: 0.8 10*3/uL (ref 0.1–1.0)
Monocytes Relative: 10 %
Neutro Abs: 4.7 10*3/uL (ref 1.7–7.7)
Neutrophils Relative %: 59 %
Platelet Count: 155 10*3/uL (ref 150–400)
RBC: 4.14 MIL/uL (ref 3.87–5.11)
RDW: 14.3 % (ref 11.5–15.5)
WBC Count: 7.8 10*3/uL (ref 4.0–10.5)
nRBC: 0 % (ref 0.0–0.2)

## 2019-02-07 LAB — CMP (CANCER CENTER ONLY)
ALT: 8 U/L (ref 0–44)
AST: 16 U/L (ref 15–41)
Albumin: 4.1 g/dL (ref 3.5–5.0)
Alkaline Phosphatase: 63 U/L (ref 38–126)
Anion gap: 10 (ref 5–15)
BUN: 19 mg/dL (ref 8–23)
CO2: 24 mmol/L (ref 22–32)
Calcium: 10 mg/dL (ref 8.9–10.3)
Chloride: 105 mmol/L (ref 98–111)
Creatinine: 1.06 mg/dL — ABNORMAL HIGH (ref 0.44–1.00)
GFR, Est AFR Am: 52 mL/min — ABNORMAL LOW (ref >60)
GFR, Estimated: 45 mL/min — ABNORMAL LOW (ref >60)
Glucose, Bld: 110 mg/dL — ABNORMAL HIGH (ref 70–99)
Potassium: 4 mmol/L (ref 3.5–5.1)
Sodium: 139 mmol/L (ref 135–145)
Total Bilirubin: 0.5 mg/dL (ref 0.3–1.2)
Total Protein: 6.2 g/dL — ABNORMAL LOW (ref 6.5–8.1)

## 2019-02-07 MED ORDER — PNEUMOCOCCAL 13-VAL CONJ VACC IM SUSP
0.5000 mL | Freq: Once | INTRAMUSCULAR | Status: AC
  Administered 2019-02-07: 0.5 mL via INTRAMUSCULAR
  Filled 2019-02-07: qty 0.5

## 2019-02-07 MED ORDER — SODIUM CHLORIDE 0.9% FLUSH
10.0000 mL | Freq: Once | INTRAVENOUS | Status: AC
  Administered 2019-02-07: 10 mL via INTRAVENOUS
  Filled 2019-02-07: qty 10

## 2019-02-07 MED ORDER — INFLUENZA VAC SPLIT QUAD 0.5 ML IM SUSY
PREFILLED_SYRINGE | INTRAMUSCULAR | Status: AC
  Filled 2019-02-07: qty 0.5

## 2019-02-07 MED ORDER — INFLUENZA VAC SPLIT QUAD 0.5 ML IM SUSY
0.5000 mL | PREFILLED_SYRINGE | Freq: Once | INTRAMUSCULAR | Status: AC
  Administered 2019-02-07: 15:00:00 0.5 mL via INTRAMUSCULAR

## 2019-02-07 MED ORDER — HEPARIN SOD (PORK) LOCK FLUSH 100 UNIT/ML IV SOLN
500.0000 [IU] | Freq: Once | INTRAVENOUS | Status: AC
  Administered 2019-02-07: 13:00:00 500 [IU] via INTRAVENOUS
  Filled 2019-02-07: qty 5

## 2019-02-07 NOTE — Progress Notes (Signed)
Hematology and Oncology Follow Up Visit  Makayla Fisher [id number] Aug 28, 1922 85 y.o. 02/07/2019   Principle Diagnosis:  Follicular large cell non-Hodgkin's lymphoma Iron deficiency anemia secondary to malabsorption  Current Therapy:   Status post cycle #8 of maintenance Rituxan - complete in June 2018 IV iron-Feraheme as indicated     Interim History:  [name] is back for followup.  She seems to be doing pretty well.  She is little bit frustrated because of the coronavirus.  She is not been able to do much in the way of activities.  Her assisted living facility has really been on lockdown.  She did gain a lot more weight.  She is trying to lose some of the weight.  She was has her litany of complaints.  I told her that for the most part, her family doctor at the assisted living facility should be able to take care of her problems.  She is still complains of fatigue and weakness.  I suspect this might be from some deconditioning.  We are checking her thyroid and iron studies.  She has had no issues with bleeding.  There is no fever.  She has had no cough.  She has had no rashes.  There is really been no leg swelling.  Thankfully, her blood pressure is doing better which is nice to see.  Currently, I would say her performance status is ECOG 2.     Medications:  Current Outpatient Medications:  .  acetaminophen (TYLENOL 8 HOUR) 650 MG CR tablet, Take 1 tablet (650 mg total) by mouth every 8 (eight) hours as needed for pain., Disp: 30 tablet, Rfl: 0 .  aspirin EC 81 MG EC tablet, Take 1 tablet (81 mg total) by mouth daily., Disp: , Rfl:  .  Calcium Carbonate-Vitamin D (CALCIUM 600+D) 600-400 MG-UNIT tablet, Take 1 tablet by mouth daily., Disp: , Rfl:  .  Cranberry 425 MG CAPS, Take 425 mg by mouth daily. , Disp: , Rfl:  .  levothyroxine (SYNTHROID, LEVOTHROID) 50 MCG tablet, Take 50 mcg by mouth every morning., Disp: , Rfl:  .  lidocaine-prilocaine (EMLA) cream, Apply 1  application topically as needed. Apply to port a cath 1 hour prior to procedure., Disp: 30 g, Rfl: 3 .  lisinopril (PRINIVIL,ZESTRIL) 20 MG tablet, Take 1 tablet (20 mg total) by mouth daily., Disp: 30 tablet, Rfl: 3 .  loperamide (IMODIUM) 2 MG capsule, Take 1 capsule (2 mg total) by mouth every 4 (four) hours as needed for diarrhea or loose stools. 1 tablet with every unformed stool up to 6 doses per day, Disp: 20 capsule, Rfl: 0 .  LORazepam (ATIVAN) 0.5 MG tablet, Take 1 tablet (0.5 mg total) by mouth 2 (two) times daily as needed for anxiety., Disp: 10 tablet, Rfl: 0 .  Multiple Vitamin (MULTIVITAMIN WITH MINERALS) TABS tablet, Take 1 tablet by mouth daily., Disp: , Rfl:  .  Multiple Vitamins-Minerals (RA VISION-VITE PRESERVE PO), Take 1 tablet by mouth 2 (two) times daily. , Disp: , Rfl:  .  ondansetron (ZOFRAN ODT) 4 MG disintegrating tablet, Take 1 tablet (4 mg total) by mouth every 8 (eight) hours as needed for nausea or vomiting., Disp: 20 tablet, Rfl: 0 .  psyllium (METAMUCIL) 58.6 % packet, Take 1 packet weekly, Disp: 30 each, Rfl: 4 .  rOPINIRole (REQUIP) 1 MG tablet, Take 1 mg by mouth at bedtime., Disp: , Rfl:  .  Saccharomyces boulardii (PROBIOTIC) 250 MG CAPS, Take 250 mg by mouth  2 (two) times daily., Disp: 14 capsule, Rfl: 0 .  sertraline (ZOLOFT) 25 MG tablet, Take 25 mg by mouth every morning. , Disp: , Rfl:  .  SPIRIVA HANDIHALER 18 MCG inhalation capsule, Place 18 mcg into inhaler and inhale daily. , Disp: , Rfl:   Allergies:  Allergies  Allergen Reactions  . Epipen [Epinephrine Hcl (Nasal)]     Heart palpitations  . Heparin     Unsure of side affects (maybe affected platelets per pt)  . Levaquin [Levofloxacin In D5w] Other (See Comments)    Weakness  . Chlorhexidine Gluconate Other (See Comments)    Pt declined use and suspects she may have an intolerance,   . Sulfa Antibiotics Rash    Past Medical History, Surgical history, Social history, and Family History were  reviewed and updated.  Review of Systems: Review of Systems  Constitutional: Negative.   HENT: Negative.   Eyes: Negative.   Respiratory: Positive for shortness of breath.   Cardiovascular: Positive for palpitations.  Gastrointestinal: Positive for diarrhea and nausea.  Genitourinary: Negative.   Musculoskeletal: Positive for joint pain and myalgias.  Skin: Negative.   Neurological: Positive for tingling.  Endo/Heme/Allergies: Negative.   Psychiatric/Behavioral: Negative.      Physical Exam:  weight is 185 lb (83.9 kg). Her temporal temperature is 97.3 F (36.3 C) (abnormal). Her blood pressure is 158/78 (abnormal) and her pulse is 67. Her respiration is 19 and oxygen saturation is 99%.   Physical Exam Vitals signs reviewed.  HENT:     Head: Normocephalic and atraumatic.  Eyes:     Pupils: Pupils are equal, round, and reactive to light.  Neck:     Musculoskeletal: Normal range of motion.  Cardiovascular:     Rate and Rhythm: Normal rate and regular rhythm.     Heart sounds: Normal heart sounds.  Pulmonary:     Effort: Pulmonary effort is normal.     Breath sounds: Normal breath sounds.  Abdominal:     General: Bowel sounds are normal.     Palpations: Abdomen is soft.     Comments: Abdominal exam shows a colostomy in the left lower quadrant.  She has multiple surgical scars.  She has no fluid wave.  There is no guarding or rebound tenderness.  There is no palpable liver or spleen tip.  Musculoskeletal: Normal range of motion.        General: No tenderness or deformity.  Lymphadenopathy:     Cervical: No cervical adenopathy.  Skin:    General: Skin is warm and dry.     Findings: No erythema or rash.  Neurological:     Mental Status: She is alert and oriented to person, place, and time.  Psychiatric:        Behavior: Behavior normal.        Thought Content: Thought content normal.        Judgment: Judgment normal.      Lab Results  Component Value Date   WBC  7.8 02/07/2019   HGB 12.9 02/07/2019   HCT 40.4 02/07/2019   MCV 97.6 02/07/2019   PLT 155 02/07/2019     Chemistry      Component Value Date/Time   NA 139 02/07/2019 1315   NA 143 01/31/2017 0931   NA 141 05/08/2014 1041   K 4.0 02/07/2019 1315   K 3.7 01/31/2017 0931   K 4.5 05/08/2014 1041   CL 105 02/07/2019 1315   CL 106 01/31/2017 0931  CO2 24 02/07/2019 1315   CO2 27 01/31/2017 0931   CO2 24 05/08/2014 1041   BUN 19 02/07/2019 1315   BUN 10 01/31/2017 0931   BUN 14.2 05/08/2014 1041   CREATININE 1.06 (H) 02/07/2019 1315   CREATININE 0.9 01/31/2017 0931   CREATININE 0.8 05/08/2014 1041      Component Value Date/Time   CALCIUM 10.0 02/07/2019 1315   CALCIUM 9.7 01/31/2017 0931   CALCIUM 9.2 05/08/2014 1041   ALKPHOS 63 02/07/2019 1315   ALKPHOS 57 01/31/2017 0931   ALKPHOS 66 05/08/2014 1041   AST 16 02/07/2019 1315   AST 23 05/08/2014 1041   ALT 8 02/07/2019 1315   ALT 14 01/31/2017 0931   ALT 10 05/08/2014 1041   BILITOT 0.5 02/07/2019 1315   BILITOT 0.42 05/08/2014 1041      Impression and Plan: Makayla Fisher is 85 year old female. She has a follicular large cell lymphoma. I do not see any evidence of recurrent disease. I do not believe that she is at significant risk for occurrence. Her labs look really good.  From my point of view, there is no problems with lymphoma.  It is been a year since she completed the maintenance Rituxan.  It has been a little over 4 years since she completed the chemotherapy.  She had a Port-A-Cath flush today.  She will need a Port-A-Cath flush every 2 months.  I will plan to see her back in 6 more months.  I try to reassure her as much as I can about her lymphoma and that it has not come back.  She always worries about this.  I told her that at this point in time, the chance of her lymphoma coming back probably will be less than 15%.   [name], MD 10/15/20202:30 PM

## 2019-02-07 NOTE — Telephone Encounter (Signed)
Appointments scheduled calendar was printed for patient per 10/15 los

## 2019-02-07 NOTE — Patient Instructions (Signed)
Tunneled Central Venous Catheter Flushing Guide  It is important to flush your tunneled central venous catheter each time you use it, both before and after you use it. Flushing your catheter will help prevent it from clogging. What are the risks? Risks may include:  Infection.  Air getting into the catheter and bloodstream. Supplies needed:  A clean pair of gloves.  A disinfecting wipe. Use an alcohol wipe, chlorhexidine wipe, or iodine wipe as told by your health care provider.  A 10 mL syringe that has been prefilled with saline solution.  An empty 10 mL syringe, if a substance called heparin was injected into your catheter. How to flush your catheter When you flush your catheter, make sure you follow any specific instructions from your health care provider or the manufacturer. These are general guidelines. Flushing your catheter before use If there is heparin in your catheter: 1. Wash your hands with soap and water. 2. Put on gloves. 3. Scrub the injection cap for a minimum of 15 seconds with a disinfecting wipe. 4. Unclamp the catheter. 5. Attach the empty syringe to the injection cap. 6. Pull the syringe plunger back and withdraw 10 mL of blood. 7. Place the syringe into an appropriate waste container. 8. Scrub the injection cap for 15 seconds with a disinfecting wipe. 9. Attach the prefilled syringe to the injection cap. 10. Flush the catheter by pushing the plunger forward until all the liquid from the syringe is in the catheter. 11. Remove the syringe from the injection cap. 12. Clamp the catheter. If there is no heparin in your catheter: 1. Wash your hands with soap and water. 2. Put on gloves. 3. Scrub the injection cap for 15 seconds with a disinfecting wipe. 4. Unclamp the catheter. 5. Attach the prefilled syringe to the injection cap. 6. Flush the catheter by pushing the plunger forward until 5 mL of the liquid from the syringe is in the catheter. 7. Pull back on  the syringe until you see blood in the catheter. 8. If you have been asked to collect any blood, follow your health care provider's instructions. Otherwise, flush the catheter with the rest of the solution from the syringe. 9. Remove the syringe from the injection cap. 10. Clamp the catheter.  Flushing your catheter after use 1. Wash your hands with soap and water. 2. Put on gloves. 3. Scrub the injection cap for 15 seconds with a disinfecting wipe. 4. Unclamp the catheter. 5. Attach the prefilled syringe to the injection cap. 6. Flush the catheter by pushing the plunger forward until all of the liquid from the syringe is in the catheter. 7. Remove the syringe from the injection cap. 8. Clamp the catheter. Problems and solutions  If blood cannot be completely cleared from the injection cap, you may need to have the injection cap replaced.  If the catheter is difficult to flush, use the pulsing method. The pulsing method involves pushing only a few milliliters of solution into the catheter at a time and pausing between pushes.  If you do not see blood in the catheter when you pull back on the syringe, change your body position, such as by raising your arms above your head. Take a deep breath and cough. Then, pull back on the syringe. If you still do not see blood, flush the catheter with a small amount of solution. Then, change positions again and take a breath or cough. Pull back on the syringe again. If you still do not see   blood, finish flushing the catheter and contact your health care provider. Do not use your catheter until your health care provider says it is okay. General tips  Have someone help you flush your catheter, if possible.  Do not force fluid through your catheter.  Do not use a syringe that is larger or smaller than 10 mL. Using a smaller syringe can make the catheter burst.  Do not use your catheter without flushing it first if it has heparin in it. Contact a health  care provider if:  You cannot see any blood in the catheter when you flush it before using it.  Your catheter is difficult to flush. Get help right away if:  You cannot flush the catheter.  The catheter leaks when you flush it or when there is fluid in it.  There are cracks or breaks in the catheter. Summary  It is important to flush your tunneled central venous catheter each time you use it, both before and after you use it.  Scrub the injection cap for 15 seconds with a disinfecting wipe before and after you flush it.  When you flush your catheter, make sure you follow any specific instructions from your health care provider or the manufacturer.  Get help right away if you cannot flush the catheter. This information is not intended to replace advice given to you by your health care provider. Make sure you discuss any questions you have with your health care provider. Document Released: 03/31/2011 Document Revised: 06/27/2018 Document Reviewed: 06/27/2018 Elsevier Patient Education  2020 [employer] Inc.  

## 2019-02-08 ENCOUNTER — Telehealth: Payer: Self-pay

## 2019-02-08 LAB — FERRITIN: Ferritin: 130 ng/mL (ref 11–307)

## 2019-02-08 LAB — IRON AND TIBC
Iron: 85 ug/dL (ref 41–142)
Saturation Ratios: 32 % (ref 21–57)
TIBC: 263 ug/dL (ref 236–444)
UIBC: 177 ug/dL (ref 120–384)

## 2019-02-08 LAB — TSH: TSH: 3.703 u[IU]/mL (ref 0.308–3.960)

## 2019-02-08 NOTE — Telephone Encounter (Addendum)
Attached message left on personalized VM with instructions to contact office for questions/concerns. dph  ----- Message from [name], MD sent at 02/08/2019 10:46 AM EDT ----- Call - the iron and thyroid are doing fine!!  Laurey Arrow

## 2019-02-27 ENCOUNTER — Other Ambulatory Visit: Payer: Self-pay | Admitting: Family Medicine

## 2019-02-27 DIAGNOSIS — Z1231 Encounter for screening mammogram for malignant neoplasm of breast: Secondary | ICD-10-CM

## 2019-03-11 ENCOUNTER — Telehealth: Payer: Self-pay | Admitting: *Deleted

## 2019-03-11 NOTE — Telephone Encounter (Signed)
Dr. Johnsie Cancel  This patient is to undergo a left upper eyelid entropion repair on 04/02/2019.  Surgical team is asking for ASA holding recommendations.  She will have MAC anesthesia.  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  Please send your holding recommendations to preoperative pool   Thank you  Sharee Pimple

## 2019-03-11 NOTE — Telephone Encounter (Signed)
   [facility] Pre-operative Risk Assessment    Request for surgical clearance:  1. What type of surgery is being performed? LEFT UPPER EYELID ENTROPION REPAIR    2. When is this surgery scheduled?  04/02/19   3. What type of clearance is required (medical clearance vs. Pharmacy clearance to hold med vs. Both)? MEDICAL   4. Are there any medications that need to be held prior to surgery and how long? ASA   5. Practice name and name of physician performing surgery? [facility]; DR. Elayne Snare ZALDIVAR  6. What is your office phone number [phone]    7.   What is your office fax number [phone]  8.   Anesthesia type (None, local, MAC, general) ? MAC   Julaine Hua 03/11/2019, 4:45 PM  _________________________________________________________________   (provider comments below)

## 2019-03-11 NOTE — Telephone Encounter (Signed)
Ok to hold asa for 5 days before surgery

## 2019-03-12 NOTE — Telephone Encounter (Signed)
   Primary Cardiologist: Jenkins Rouge, MD  Chart reviewed as part of pre-operative protocol coverage. Given past medical history and time since last visit, based on ACC/AHA guidelines, Makayla Fisher would be at acceptable risk for the planned procedure without further cardiovascular testing.   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   She was last seen by Dr. Johnsie Cancel 09/25/2018 and was doing well from a cardiac perspective with no complaints of angina or SOB.   Per Dr. Johnsie Cancel, it will be acceptable to hold ASA therapy for 5 days prior to procedure then resume when ok from a surgical standpoint.   I will route this recommendation to the requesting party via Epic fax function and remove from pre-op pool.  Please call with questions.  Kathyrn Drown, NP 03/12/2019, 7:40 AM

## 2019-04-04 ENCOUNTER — Ambulatory Visit: Payer: Medicare Other | Admitting: Cardiovascular Disease

## 2019-04-23 ENCOUNTER — Ambulatory Visit: Payer: Medicare Other

## 2019-05-07 ENCOUNTER — Telehealth: Payer: Self-pay | Admitting: *Deleted

## 2019-05-07 NOTE — Telephone Encounter (Signed)
Message received from patient wanting to know if Dr. Marin Olp recommends that she receive the covid vaccine.  Call placed back to patient and patient notified that Dr. Marin Olp does recommend for her to receive the covid vaccine.  Pt appreciative of call and has no further questions or concerns at this time.

## 2019-06-12 ENCOUNTER — Inpatient Hospital Stay: Payer: Medicare Other | Attending: Family

## 2019-07-02 NOTE — Progress Notes (Signed)
Date:  07/16/2019   ID:  Makayla Fisher, DOB [birth year], MRN [id number]  PCP:  Makayla Caprice, DO  Cardiologist:   Johnsie Cancel Electrophysiologist:  None   Evaluation Performed:  Follow-Up Visit  Chief Complaint:  CAD  History of Present Illness:    86 y.o. .  Initially seen  for preop clearance in [id number]  Has follicular large cell lymphoma of abdomen.   Distant history of CAD  With stent /CABG in 2009 [facility] [place]  Has had bradycardia on higher dose beta blocker   05/01/18 echo normal EF stable moderate MR  05/01/17 carotid plaque no stenosis   06/13/15   Had colostomy revision for stricture with slow recovery but no cardiac complications    Sees Dr Marin Olp has maintenance Rituxan completed 09/2016 and gets IV iron for malabsorption   Memory issues and statin stopped   Complains of fatigue TSH 3.6 05/10/18 Hct 40.9  Had left eyelid surgery end of last year no issues holding ASA   She is a bit deconditioned as her assisted living has been on lock down This is only the 2 nd time she's been out    Past Medical History:  Diagnosis Date  . GERD (gastroesophageal reflux disease)   . Heart attack ([place]) 2009  . History of blood transfusion 2009  . Iron deficiency anemia due to chronic blood loss 08/07/2014  . NHL (non-Hodgkin's lymphoma) ([place]) 10/18/2013   finished chemo dec 2015, maintenanance retuxin   Past Surgical History:  Procedure Laterality Date  . c section  1947, 1963  . CARDIAC SURGERY  2009  . CHOLECYSTECTOMY  1973  . COLON SURGERY  2014   Colostomy  . COLOSTOMY REVISION N/A 06/12/2014   Procedure: COLOSTOMY REVISION;  Surgeon: [name], MD;  Location: [facility];  Service: General;  Laterality: N/A;  . CORONARY ANGIOPLASTY WITH STENT PLACEMENT  2009  . CORONARY ARTERY BYPASS GRAFT  aug 2009    x3  . ESOPHAGOGASTRODUODENOSCOPY  2013  . EYE SURGERY Bilateral  10 years ago   lens replacments cataracts  . right chest pac  june 2015      Current Meds  Medication Sig  . acetaminophen (TYLENOL 8 HOUR) 650 MG CR tablet Take 1 tablet (650 mg total) by mouth every 8 (eight) hours as needed for pain.  Marland Kitchen aspirin EC 81 MG EC tablet Take 1 tablet (81 mg total) by mouth daily.  . Calcium Carbonate-Vitamin D (CALCIUM 600+D) 600-400 MG-UNIT tablet Take 1 tablet by mouth daily.  . Cranberry 425 MG CAPS Take 425 mg by mouth daily.   Marland Kitchen HM VITAMIN D3 100 MCG (4000 UT) CAPS Take 1 capsule by mouth daily.  Marland Kitchen levothyroxine (SYNTHROID, LEVOTHROID) 50 MCG tablet Take 50 mcg by mouth every morning.  Marland Kitchen lisinopril (PRINIVIL,ZESTRIL) 20 MG tablet Take 1 tablet (20 mg total) by mouth daily.  . Multiple Vitamin (MULTIVITAMIN WITH MINERALS) TABS tablet Take 1 tablet by mouth daily.  . Multiple Vitamins-Minerals (RA VISION-VITE PRESERVE PO) Take 1 tablet by mouth 2 (two) times daily.   Marland Kitchen olopatadine (PATANOL) 0.1 % ophthalmic solution Place 1 drop into both eyes daily.  . psyllium (METAMUCIL) 58.6 % packet Take 1 packet weekly  . rOPINIRole (REQUIP) 1 MG tablet Take 1 mg by mouth at bedtime.  . Saccharomyces boulardii (PROBIOTIC) 250 MG CAPS Take 250 mg by mouth 2 (two) times daily.  . sertraline (ZOLOFT) 25 MG tablet Take 25 mg by mouth every morning.   Marland Kitchen  SPIRIVA HANDIHALER 18 MCG inhalation capsule Place 18 mcg into inhaler and inhale daily.   . vitamin B-12 (CYANOCOBALAMIN) 1000 MCG tablet Take 1,000 mcg by mouth daily.     Allergies:   Epipen [epinephrine hcl (nasal)], Heparin, Levaquin [levofloxacin in d5w], Chlorhexidine gluconate, and Sulfa antibiotics   Social History   Tobacco Use  . Smoking status: Former Smoker    Packs/day: 1.00    Years: 25.00    Pack years: 25.00    Types: Cigarettes    Start date: 05/16/1948    Quit date: 05/17/1963    Years since quitting: 56.2  . Smokeless tobacco: Never Used  . Tobacco comment: quit smoking 50 years ago  Substance Use Topics  . Alcohol use: No    Alcohol/week: 0.0 standard drinks  . Drug  use: No     Family Hx: The patient's family history includes Diverticulitis in her mother; Heart attack in her mother; Ulcers in her father.  ROS:   Please see the history of present illness.     All other systems reviewed and are negative.   Prior CV studies:   The following studies were reviewed today:  Echo 05/01/18   Labs/Other Tests and Data Reviewed:    EKG: 07/16/19 SR rate 77 low voltage   Recent Labs: 02/07/2019: ALT 8; BUN 19; Creatinine 1.06; Hemoglobin 12.9; Platelet Count 155; Potassium 4.0; Sodium 139; TSH 3.703   Recent Lipid Panel Lab Results  Component Value Date/Time   CHOL 189 05/01/2017 05:20 AM   TRIG 71 05/01/2017 05:20 AM   HDL 62 05/01/2017 05:20 AM   CHOLHDL 3.0 05/01/2017 05:20 AM   LDLCALC 113 (H) 05/01/2017 05:20 AM    Wt Readings from Last 3 Encounters:  07/16/19 180 lb (81.6 kg)  02/07/19 185 lb (83.9 kg)  09/25/18 190 lb (86.2 kg)     Objective:    Vital Signs:  BP (!) 142/80   Pulse 77   Ht 5\' 2"  (1.575 m)   Wt 180 lb (81.6 kg)   SpO2 97%   BMI 32.92 kg/m    Affect appropriate Healthy:  appears stated age HEENT: normal Neck supple with no adenopathy JVP normal no bruits no thyromegaly Lungs clear with no wheezing and good diaphragmatic motion Heart:  S1/S2 MR  murmur, no rub, gallop or click PMI normal Abdomen: benighn, BS positve, post colectomy  Distal pulses intact with no bruits No edema Neuro non-focal Skin warm and dry No muscular weakness   ASSESSMENT & PLAN:    CAD:  Stable with no angina and good activity level.  Continue medical Rx Distant CABG in 2009  HTN:  Well controlled.  Continue current medications and low sodium Dash type diet.    Chol:  Statin stopped due to memory issues and age    GI:  Good result with colostomy revision no cardiac complication with surgery   Bradycardia:  Improved off beta blocker no indication for pacer   Carotid:  Reviewed duplex 05/01/17  1-39% ICA bilateral no routine  f/u scanning   Murmur: moderate MR stable by echo 05/01/18   Hematology:  F/U ennever history of follicular large cell lymphoma and anemia continue Rituxan and Fereheme Encouraged her to get port flushed regularly    COVID-19 Education: The signs and symptoms of COVID-19 were discussed with the patient and how to seek care for testing (follow up with PCP or arrange E-visit).  The importance of social distancing was discussed today.    Medication  Adjustments/Labs and Tests Ordered: Current medicines are reviewed at length with the patient today.  Concerns regarding medicines are outlined above.   Tests Ordered:  None   Medication Changes:  None   Disposition:  Follow up in  A year   Signed, Jenkins Rouge, MD  07/16/2019 2:25 PM    [facility]

## 2019-07-16 ENCOUNTER — Ambulatory Visit (INDEPENDENT_AMBULATORY_CARE_PROVIDER_SITE_OTHER): Payer: Medicare Other | Admitting: Cardiovascular Disease

## 2019-07-16 ENCOUNTER — Encounter: Payer: Self-pay | Admitting: Cardiovascular Disease

## 2019-07-16 ENCOUNTER — Other Ambulatory Visit: Payer: Self-pay

## 2019-07-16 VITALS — BP 142/80 | HR 77 | Ht 62.0 in | Wt 180.0 lb

## 2019-07-16 DIAGNOSIS — R001 Bradycardia, unspecified: Secondary | ICD-10-CM

## 2019-07-16 NOTE — Patient Instructions (Signed)

## 2019-08-06 ENCOUNTER — Ambulatory Visit: Payer: Medicare Other | Admitting: Family

## 2019-08-06 ENCOUNTER — Other Ambulatory Visit: Payer: Medicare Other

## 2019-08-09 ENCOUNTER — Ambulatory Visit: Payer: Medicare Other | Admitting: Family

## 2019-08-09 ENCOUNTER — Other Ambulatory Visit: Payer: Medicare Other

## 2019-08-20 ENCOUNTER — Inpatient Hospital Stay: Payer: Medicare Other | Attending: Family

## 2019-08-20 ENCOUNTER — Other Ambulatory Visit: Payer: Medicare Other

## 2019-08-20 ENCOUNTER — Ambulatory Visit: Payer: Medicare Other | Admitting: Family

## 2019-08-20 ENCOUNTER — Encounter: Payer: Self-pay | Admitting: Family

## 2019-08-20 ENCOUNTER — Inpatient Hospital Stay (HOSPITAL_BASED_OUTPATIENT_CLINIC_OR_DEPARTMENT_OTHER): Payer: Medicare Other | Admitting: Family

## 2019-08-20 ENCOUNTER — Other Ambulatory Visit: Payer: Self-pay

## 2019-08-20 ENCOUNTER — Other Ambulatory Visit: Payer: Self-pay | Admitting: Family

## 2019-08-20 ENCOUNTER — Inpatient Hospital Stay: Payer: Medicare Other

## 2019-08-20 VITALS — BP 159/58 | HR 71 | Temp 97.5°F | Resp 20 | Ht 62.0 in | Wt 184.8 lb

## 2019-08-20 DIAGNOSIS — D508 Other iron deficiency anemias: Secondary | ICD-10-CM | POA: Insufficient documentation

## 2019-08-20 DIAGNOSIS — D500 Iron deficiency anemia secondary to blood loss (chronic): Secondary | ICD-10-CM

## 2019-08-20 DIAGNOSIS — C8253 Diffuse follicle center lymphoma, intra-abdominal lymph nodes: Secondary | ICD-10-CM

## 2019-08-20 DIAGNOSIS — K909 Intestinal malabsorption, unspecified: Secondary | ICD-10-CM | POA: Diagnosis present

## 2019-08-20 DIAGNOSIS — E032 Hypothyroidism due to medicaments and other exogenous substances: Secondary | ICD-10-CM

## 2019-08-20 DIAGNOSIS — Z7982 Long term (current) use of aspirin: Secondary | ICD-10-CM | POA: Insufficient documentation

## 2019-08-20 DIAGNOSIS — K9403 Colostomy malfunction: Secondary | ICD-10-CM

## 2019-08-20 DIAGNOSIS — Z79899 Other long term (current) drug therapy: Secondary | ICD-10-CM | POA: Insufficient documentation

## 2019-08-20 DIAGNOSIS — Z8572 Personal history of non-Hodgkin lymphomas: Secondary | ICD-10-CM | POA: Diagnosis not present

## 2019-08-20 DIAGNOSIS — C8203 Follicular lymphoma grade I, intra-abdominal lymph nodes: Secondary | ICD-10-CM

## 2019-08-20 DIAGNOSIS — K56699 Other intestinal obstruction unspecified as to partial versus complete obstruction: Secondary | ICD-10-CM

## 2019-08-20 DIAGNOSIS — Z23 Encounter for immunization: Secondary | ICD-10-CM

## 2019-08-20 LAB — CBC WITH DIFFERENTIAL (CANCER CENTER ONLY)
Abs Immature Granulocytes: 0.05 10*3/uL (ref 0.00–0.07)
Basophils Absolute: 0.1 10*3/uL (ref 0.0–0.1)
Basophils Relative: 1 %
Eosinophils Absolute: 0.2 10*3/uL (ref 0.0–0.5)
Eosinophils Relative: 3 %
HCT: 37.5 % (ref 36.0–46.0)
Hemoglobin: 11.9 g/dL — ABNORMAL LOW (ref 12.0–15.0)
Immature Granulocytes: 1 %
Lymphocytes Relative: 27 %
Lymphs Abs: 2.3 10*3/uL (ref 0.7–4.0)
MCH: 31 pg (ref 26.0–34.0)
MCHC: 31.7 g/dL (ref 30.0–36.0)
MCV: 97.7 fL (ref 80.0–100.0)
Monocytes Absolute: 0.8 10*3/uL (ref 0.1–1.0)
Monocytes Relative: 9 %
Neutro Abs: 5.1 10*3/uL (ref 1.7–7.7)
Neutrophils Relative %: 59 %
Platelet Count: 130 10*3/uL — ABNORMAL LOW (ref 150–400)
RBC: 3.84 MIL/uL — ABNORMAL LOW (ref 3.87–5.11)
RDW: 15.3 % (ref 11.5–15.5)
WBC Count: 8.6 10*3/uL (ref 4.0–10.5)
nRBC: 0 % (ref 0.0–0.2)

## 2019-08-20 LAB — CMP (CANCER CENTER ONLY)
ALT: 8 U/L (ref 0–44)
AST: 14 U/L — ABNORMAL LOW (ref 15–41)
Albumin: 3.9 g/dL (ref 3.5–5.0)
Alkaline Phosphatase: 52 U/L (ref 38–126)
Anion gap: 7 (ref 5–15)
BUN: 29 mg/dL — ABNORMAL HIGH (ref 8–23)
CO2: 21 mmol/L — ABNORMAL LOW (ref 22–32)
Calcium: 9.4 mg/dL (ref 8.9–10.3)
Chloride: 109 mmol/L (ref 98–111)
Creatinine: 1.11 mg/dL — ABNORMAL HIGH (ref 0.44–1.00)
GFR, Est AFR Am: 49 mL/min — ABNORMAL LOW (ref >60)
GFR, Estimated: 42 mL/min — ABNORMAL LOW (ref >60)
Glucose, Bld: 96 mg/dL (ref 70–99)
Potassium: 4.7 mmol/L (ref 3.5–5.1)
Sodium: 137 mmol/L (ref 135–145)
Total Bilirubin: 0.3 mg/dL (ref 0.3–1.2)
Total Protein: 6.1 g/dL — ABNORMAL LOW (ref 6.5–8.1)

## 2019-08-20 MED ORDER — SODIUM CHLORIDE 0.9 % IJ SOLN
10.0000 mL | INTRAMUSCULAR | Status: DC | PRN
  Administered 2019-08-20: 10 mL
  Filled 2019-08-20: qty 10

## 2019-08-20 MED ORDER — HEPARIN SOD (PORK) LOCK FLUSH 100 UNIT/ML IV SOLN
500.0000 [IU] | Freq: Once | INTRAVENOUS | Status: AC | PRN
  Administered 2019-08-20: 500 [IU]
  Filled 2019-08-20: qty 5

## 2019-08-20 NOTE — Progress Notes (Signed)
Hematology and Oncology Follow Up Visit  Makayla Fisher [id number] 1923/03/30 86 y.o. 08/20/2019   Principle Diagnosis:  Follicular large cell non-Hodgkin's lymphoma Iron deficiency anemia secondary to malabsorption  Current Therapy:        Status post cycle 8 of maintenance Rituxan - complete in June 2018 IV iron as indicated    Interim History:  Makayla Fisher is here today for follow-up. We have not seen her since October 2020. She is doing well but still notes fatigue.  She has not noted any blood loss. No bruising or petechiae.  Hgb is 11.9, MCV 97, WBC count 8.6 and platelets 103.  Lymphocytes 27%, neutrophils 59%.  No fever, chills, n/v, cough, rash, dizziness, SOB, chest pain, palpitation, abdominal pain or changes in bowel or bladder habits.  Ostomy intact and functioning appropriately.  No swelling, tenderness, numbness or tingling in her extremities at this time. She will occasionally have numbness and tingling in her arms when she sleeps a certain way. Once she moves out of that position, her feeling returns.  No falls or syncopal episodes to report.  She has maintained a good appetite and is staying well hydrated. Her weight is stable.   ECOG Performance Status: 1 - Symptomatic but completely ambulatory  Medications:  Allergies as of 08/20/2019      Reactions   Epipen [epinephrine Hcl (nasal)]    Heart palpitations   Heparin    Unsure of side affects (maybe affected platelets per pt)   Levaquin [levofloxacin In D5w] Other (See Comments)   Weakness   Chlorhexidine Gluconate Other (See Comments)   Pt declined use and suspects she may have an intolerance,    Sulfa Antibiotics Rash      Medication List       Accurate as of August 20, 2019  2:45 PM. If you have any questions, ask your nurse or doctor.        acetaminophen 650 MG CR tablet Commonly known as: Tylenol 8 Hour Take 1 tablet (650 mg total) by mouth every 8 (eight) hours as needed for pain.     aspirin 81 MG EC tablet Take 1 tablet (81 mg total) by mouth daily.   Calcium 600+D 600-400 MG-UNIT tablet Generic drug: Calcium Carbonate-Vitamin D Take 1 tablet by mouth daily.   Cranberry 425 MG Caps Take 425 mg by mouth daily.   HM Vitamin D3 100 MCG (4000 UT) Caps Generic drug: Cholecalciferol Take 1 capsule by mouth daily.   levothyroxine 50 MCG tablet Commonly known as: SYNTHROID Take 50 mcg by mouth every morning.   lisinopril 20 MG tablet Commonly known as: ZESTRIL Take 1 tablet (20 mg total) by mouth daily.   multivitamin with minerals Tabs tablet Take 1 tablet by mouth daily.   olopatadine 0.1 % ophthalmic solution Commonly known as: PATANOL Place 1 drop into both eyes daily.   Probiotic 250 MG Caps Take 250 mg by mouth 2 (two) times daily.   psyllium 58.6 % packet Commonly known as: METAMUCIL Take 1 packet weekly   RA VISION-VITE PRESERVE PO Take 1 tablet by mouth 2 (two) times daily.   rOPINIRole 1 MG tablet Commonly known as: REQUIP Take 1 mg by mouth at bedtime.   sertraline 25 MG tablet Commonly known as: ZOLOFT Take 25 mg by mouth every morning.   Spiriva HandiHaler 18 MCG inhalation capsule Generic drug: tiotropium Place 18 mcg into inhaler and inhale daily.   vitamin B-12 1000 MCG tablet Commonly known as: CYANOCOBALAMIN Take  1,000 mcg by mouth daily.       Allergies:  Allergies  Allergen Reactions  . Epipen [Epinephrine Hcl (Nasal)]     Heart palpitations  . Heparin     Unsure of side affects (maybe affected platelets per pt)  . Levaquin [Levofloxacin In D5w] Other (See Comments)    Weakness  . Chlorhexidine Gluconate Other (See Comments)    Pt declined use and suspects she may have an intolerance,   . Sulfa Antibiotics Rash    Past Medical History, Surgical history, Social history, and Family History were reviewed and updated.  Review of Systems: All other 10 point review of systems is negative.   Physical Exam:   vitals were not taken for this visit.   Wt Readings from Last 3 Encounters:  07/16/19 180 lb (81.6 kg)  02/07/19 185 lb (83.9 kg)  09/25/18 190 lb (86.2 kg)    Ocular: Sclerae unicteric, pupils equal, round and reactive to light Ear-nose-throat: Oropharynx clear, dentition fair Lymphatic: No cervical or supraclavicular adenopathy Lungs no rales or rhonchi, good excursion bilaterally Heart regular rate and rhythm, no murmur appreciated Abd soft, nontender, positive bowel sounds, no liver or spleen tip palpated on exam, no fluid wave  MSK no focal spinal tenderness, no joint edema Neuro: non-focal, well-oriented, appropriate affect Breasts: Deferred   Lab Results  Component Value Date   WBC 7.8 02/07/2019   HGB 12.9 02/07/2019   HCT 40.4 02/07/2019   MCV 97.6 02/07/2019   PLT 155 02/07/2019   Lab Results  Component Value Date   FERRITIN 130 02/07/2019   IRON 85 02/07/2019   TIBC 263 02/07/2019   UIBC 177 02/07/2019   IRONPCTSAT 32 02/07/2019   Lab Results  Component Value Date   RETICCTPCT 0.8 10/23/2014   RBC 4.14 02/07/2019   RETICCTABS 29.0 10/23/2014   No results found for: KPAFRELGTCHN, LAMBDASER, [facility] Lab Results  Component Value Date   IGGSERUM 548 (L) 12/19/2016   IGA 30 (L) 12/19/2016   IGMSERUM 17 (L) 12/19/2016   No results found for: Odetta Pink, SPEI   Chemistry      Component Value Date/Time   NA 139 02/07/2019 1315   NA 143 01/31/2017 0931   NA 141 05/08/2014 1041   K 4.0 02/07/2019 1315   K 3.7 01/31/2017 0931   K 4.5 05/08/2014 1041   CL 105 02/07/2019 1315   CL 106 01/31/2017 0931   CO2 24 02/07/2019 1315   CO2 27 01/31/2017 0931   CO2 24 05/08/2014 1041   BUN 19 02/07/2019 1315   BUN 10 01/31/2017 0931   BUN 14.2 05/08/2014 1041   CREATININE 1.06 (H) 02/07/2019 1315   CREATININE 0.9 01/31/2017 0931   CREATININE 0.8 05/08/2014 1041      Component Value Date/Time    CALCIUM 10.0 02/07/2019 1315   CALCIUM 9.7 01/31/2017 0931   CALCIUM 9.2 05/08/2014 1041   ALKPHOS 63 02/07/2019 1315   ALKPHOS 57 01/31/2017 0931   ALKPHOS 66 05/08/2014 1041   AST 16 02/07/2019 1315   AST 23 05/08/2014 1041   ALT 8 02/07/2019 1315   ALT 14 01/31/2017 0931   ALT 10 05/08/2014 1041   BILITOT 0.5 02/07/2019 1315   BILITOT 0.42 05/08/2014 1041       Impression and Plan: Makayla Fisher is a very pleasant 86 yo caucasian female with history of follicular large cell lymphoma. So far there has been no evidence of recurrence.  She also has history of iron deficiency. We will see what her iron studies look like and replace if needed.  We will go ahead and plan to see her back in another 2 months.  She will contact our office with any questions or concerns. We can certainly see her sooner if needed.   Laverna Peace, NP 4/27/20212:45 PM

## 2019-08-20 NOTE — Patient Instructions (Signed)

## 2019-08-21 LAB — IRON AND TIBC
Iron: 73 ug/dL (ref 41–142)
Saturation Ratios: 29 % (ref 21–57)
TIBC: 254 ug/dL (ref 236–444)
UIBC: 181 ug/dL (ref 120–384)

## 2019-08-21 LAB — TSH: TSH: 3.043 u[IU]/mL (ref 0.308–3.960)

## 2019-08-21 LAB — FERRITIN: Ferritin: 137 ng/mL (ref 11–307)

## 2019-10-24 ENCOUNTER — Inpatient Hospital Stay: Payer: Medicare Other

## 2019-10-24 ENCOUNTER — Inpatient Hospital Stay: Payer: Medicare Other | Admitting: Hematology & Oncology

## 2019-10-31 ENCOUNTER — Other Ambulatory Visit: Payer: Self-pay

## 2019-10-31 ENCOUNTER — Ambulatory Visit (INDEPENDENT_AMBULATORY_CARE_PROVIDER_SITE_OTHER): Payer: Medicare Other | Admitting: Podiatry

## 2019-10-31 ENCOUNTER — Encounter: Payer: Self-pay | Admitting: Podiatry

## 2019-10-31 ENCOUNTER — Telehealth: Payer: Self-pay | Admitting: *Deleted

## 2019-10-31 DIAGNOSIS — L03031 Cellulitis of right toe: Secondary | ICD-10-CM

## 2019-10-31 MED ORDER — NEOMYCIN-POLYMYXIN-HC 1 % OT SOLN
OTIC | 1 refills | Status: DC
Start: 2019-10-31 — End: 2020-07-23

## 2019-10-31 NOTE — Progress Notes (Signed)
Subjective:  Patient ID: Makayla Fisher, female    DOB: 1922-12-29,  MRN: [id number] HPI Chief Complaint  Patient presents with  . Toe Pain    Hallux right - lateral border, red, swollen, tried trimming, toenail fell off, concerned about fungus  . New Patient (Initial Visit)    86 y.o. female presents with the above complaint.   ROS: Denies fever chills nausea vomiting muscle aches pains calf pain back pain chest pain shortness of breath.  Past Medical History:  Diagnosis Date  . GERD (gastroesophageal reflux disease)   . Heart attack ([place]) 2009  . History of blood transfusion 2009  . Iron deficiency anemia due to chronic blood loss 08/07/2014  . NHL (non-Hodgkin's lymphoma) ([place]) 10/18/2013   finished chemo dec 2015, maintenanance retuxin   Past Surgical History:  Procedure Laterality Date  . c section  1947, 1963  . CARDIAC SURGERY  2009  . CHOLECYSTECTOMY  1973  . COLON SURGERY  2014   Colostomy  . COLOSTOMY REVISION N/A 06/12/2014   Procedure: COLOSTOMY REVISION;  Surgeon: [name], MD;  Location: [facility];  Service: General;  Laterality: N/A;  . CORONARY ANGIOPLASTY WITH STENT PLACEMENT  2009  . CORONARY ARTERY BYPASS GRAFT  aug 2009    x3  . ESOPHAGOGASTRODUODENOSCOPY  2013  . EYE SURGERY Bilateral  10 years ago   lens replacments cataracts  . right chest pac  june 2015    Current Outpatient Medications:  .  acetaminophen (TYLENOL 8 HOUR) 650 MG CR tablet, Take 1 tablet (650 mg total) by mouth every 8 (eight) hours as needed for pain., Disp: 30 tablet, Rfl: 0 .  aspirin EC 81 MG EC tablet, Take 1 tablet (81 mg total) by mouth daily., Disp: , Rfl:  .  Calcium Carbonate-Vitamin D (CALCIUM 600+D) 600-400 MG-UNIT tablet, Take 1 tablet by mouth daily., Disp: , Rfl:  .  Cranberry 425 MG CAPS, Take 425 mg by mouth daily. , Disp: , Rfl:  .  Glycerin-Polysorbate 80 (REFRESH DRY EYE THERAPY OP), Place 2 drops into both eyes daily., Disp: , Rfl:  .  HM VITAMIN D3 100 MCG  (4000 UT) CAPS, Take 1 capsule by mouth daily., Disp: , Rfl:  .  levothyroxine (SYNTHROID, LEVOTHROID) 50 MCG tablet, Take 50 mcg by mouth every morning., Disp: , Rfl:  .  lidocaine-prilocaine (EMLA) cream, SMARTSIG:Sparingly Topical As Directed, Disp: , Rfl:  .  lisinopril (PRINIVIL,ZESTRIL) 20 MG tablet, Take 1 tablet (20 mg total) by mouth daily., Disp: 30 tablet, Rfl: 3 .  Multiple Vitamin (MULTIVITAMIN WITH MINERALS) TABS tablet, Take 1 tablet by mouth daily., Disp: , Rfl:  .  Multiple Vitamins-Minerals (RA VISION-VITE PRESERVE PO), Take 1 tablet by mouth 2 (two) times daily. , Disp: , Rfl:  .  NEOMYCIN-POLYMYXIN-HYDROCORTISONE (CORTISPORIN) 1 % SOLN OTIC solution, Apply 1-2 drops to toe BID after soaking, Disp: 10 mL, Rfl: 1 .  olopatadine (PATANOL) 0.1 % ophthalmic solution, Place 1 drop into both eyes daily., Disp: , Rfl:  .  psyllium (METAMUCIL) 58.6 % packet, Take 1 packet weekly, Disp: 30 each, Rfl: 4 .  rOPINIRole (REQUIP) 1 MG tablet, Take 1 mg by mouth at bedtime., Disp: , Rfl:  .  Saccharomyces boulardii (PROBIOTIC) 250 MG CAPS, Take 250 mg by mouth 2 (two) times daily., Disp: 14 capsule, Rfl: 0 .  sertraline (ZOLOFT) 25 MG tablet, Take 25 mg by mouth every morning. , Disp: , Rfl:  .  SPIRIVA HANDIHALER 18 MCG inhalation capsule,  Place 18 mcg into inhaler and inhale daily. , Disp: , Rfl:  .  SYSTANE COMPLETE 0.6 % SOLN, Apply 1 drop to eye 3 (three) times daily., Disp: , Rfl:  .  vitamin B-12 (CYANOCOBALAMIN) 1000 MCG tablet, Take 1,000 mcg by mouth daily., Disp: , Rfl:   Allergies  Allergen Reactions  . Epipen [Epinephrine Hcl (Nasal)] Palpitations  . Levaquin [Levofloxacin In D5w] Other (See Comments)    Weakness  . Chlorhexidine Gluconate Other (See Comments)    Pt declined use and suspects she may have an intolerance,   . Heparin Other (See Comments)    Unknown Reaction: (maybe affected platelets per pt)  . Sulfa Antibiotics Rash   Review of Systems Objective:  There  were no vitals filed for this visit.  General: Well developed, nourished, in no acute distress, alert and oriented x3   Dermatological: Skin is warm, dry and supple bilateral. Nails x 10 are well maintained; remaining integument appears unremarkable at this time. There are no open sores, no preulcerative lesions, no rash or signs of infection present.  Sharp incurvated nail margin along the fibular border of the hallux right.  Mild erythema no purulence no malodor.  Vascular: Dorsalis Pedis artery and Posterior Tibial artery pedal pulses are 2/4 bilateral with immedate capillary fill time. Pedal hair growth present. No varicosities and no lower extremity edema present bilateral.   Neruologic: Grossly intact via light touch bilateral. Vibratory intact via tuning fork bilateral. Protective threshold with Semmes Wienstein monofilament intact to all pedal sites bilateral. Patellar and Achilles deep tendon reflexes 2+ bilateral. No Babinski or clonus noted bilateral.   Musculoskeletal: No gross boney pedal deformities bilateral. No pain, crepitus, or limitation noted with foot and ankle range of motion bilateral. Muscular strength 5/5 in all groups tested bilateral.  Gait: Unassisted, Nonantalgic.    Radiographs:  None taken  Assessment & Plan:   Assessment: Paronychia fibular border hallux right.  Plan: Nail avulsion partial fibular border hallux right performed today after local and was administered.  She tolerated procedure well without complications.  Was provided with both oral and written home-going instructions for care and soaking of the time as well as a prescription for Cortisporin Otic to be applied twice daily.  I will follow-up with her in 2 weeks.     Alizay Bronkema T. [place], [place]

## 2019-10-31 NOTE — Patient Instructions (Signed)

## 2019-10-31 NOTE — Telephone Encounter (Signed)
Makayla Fisher - [facility] states pt was seen in-office today, but her orders were not signed.

## 2019-10-31 NOTE — Telephone Encounter (Signed)
Left message for Davy Pique - [facility] to call or fax the orders to [phone].

## 2019-11-16 ENCOUNTER — Other Ambulatory Visit: Payer: Self-pay

## 2019-11-16 ENCOUNTER — Emergency Department (HOSPITAL_COMMUNITY)
Admission: EM | Admit: 2019-11-16 | Discharge: 2019-11-17 | Disposition: A | Discharge status: Home / Self Care | Payer: Medicare Other | Attending: Emergency Medicine | Admitting: Emergency Medicine

## 2019-11-16 ENCOUNTER — Emergency Department (HOSPITAL_COMMUNITY): Payer: Medicare Other

## 2019-11-16 ENCOUNTER — Encounter (HOSPITAL_COMMUNITY): Payer: Self-pay

## 2019-11-16 DIAGNOSIS — I10 Essential (primary) hypertension: Secondary | ICD-10-CM | POA: Diagnosis present

## 2019-11-16 DIAGNOSIS — E039 Hypothyroidism, unspecified: Secondary | ICD-10-CM | POA: Diagnosis not present

## 2019-11-16 DIAGNOSIS — R200 Anesthesia of skin: Secondary | ICD-10-CM | POA: Diagnosis not present

## 2019-11-16 DIAGNOSIS — Z87891 Personal history of nicotine dependence: Secondary | ICD-10-CM | POA: Insufficient documentation

## 2019-11-16 DIAGNOSIS — R519 Headache, unspecified: Secondary | ICD-10-CM

## 2019-11-16 DIAGNOSIS — Z79899 Other long term (current) drug therapy: Secondary | ICD-10-CM | POA: Diagnosis not present

## 2019-11-16 LAB — CBC
HCT: 44.1 % (ref 36.0–46.0)
Hemoglobin: 13.8 g/dL (ref 12.0–15.0)
MCH: 30.2 pg (ref 26.0–34.0)
MCHC: 31.3 g/dL (ref 30.0–36.0)
MCV: 96.5 fL (ref 80.0–100.0)
Platelets: 148 10*3/uL — ABNORMAL LOW (ref 150–400)
RBC: 4.57 MIL/uL (ref 3.87–5.11)
RDW: 13.4 % (ref 11.5–15.5)
WBC: 9.9 10*3/uL (ref 4.0–10.5)
nRBC: 0 % (ref 0.0–0.2)

## 2019-11-16 LAB — BASIC METABOLIC PANEL
Anion gap: 11 (ref 5–15)
BUN: 15 mg/dL (ref 8–23)
CO2: 25 mmol/L (ref 22–32)
Calcium: 9.8 mg/dL (ref 8.9–10.3)
Chloride: 105 mmol/L (ref 98–111)
Creatinine, Ser: 0.9 mg/dL (ref 0.44–1.00)
GFR calc Af Amer: >60 mL/min (ref >60)
GFR calc non Af Amer: 55 mL/min — ABNORMAL LOW (ref >60)
Glucose, Bld: 113 mg/dL — ABNORMAL HIGH (ref 70–99)
Potassium: 3.8 mmol/L (ref 3.5–5.1)
Sodium: 141 mmol/L (ref 135–145)

## 2019-11-16 LAB — SEDIMENTATION RATE: Sed Rate: 14 mm/hr (ref 0–22)

## 2019-11-16 MED ORDER — DIPHENHYDRAMINE HCL 50 MG/ML IJ SOLN
12.5000 mg | Freq: Once | INTRAMUSCULAR | Status: AC
  Administered 2019-11-16: 12.5 mg via INTRAVENOUS
  Filled 2019-11-16: qty 1

## 2019-11-16 MED ORDER — METOCLOPRAMIDE HCL 5 MG/ML IJ SOLN
10.0000 mg | Freq: Once | INTRAMUSCULAR | Status: AC
  Administered 2019-11-16: 10 mg via INTRAVENOUS
  Filled 2019-11-16: qty 2

## 2019-11-16 MED ORDER — SODIUM CHLORIDE 0.9 % IV SOLN: INTRAVENOUS | Status: DC

## 2019-11-16 MED ORDER — METOPROLOL TARTRATE 5 MG/5ML IV SOLN
5.0000 mg | Freq: Once | INTRAVENOUS | Status: AC
  Administered 2019-11-16: 5 mg via INTRAVENOUS
  Filled 2019-11-16: qty 5

## 2019-11-16 MED ORDER — MORPHINE SULFATE (PF) 2 MG/ML IV SOLN
2.0000 mg | Freq: Once | INTRAVENOUS | Status: AC
  Administered 2019-11-16: 2 mg via INTRAVENOUS
  Filled 2019-11-16: qty 1

## 2019-11-16 MED ORDER — ROPINIROLE HCL 1 MG PO TABS
1.0000 mg | ORAL_TABLET | Freq: Once | ORAL | Status: AC
  Administered 2019-11-17: 1 mg via ORAL
  Filled 2019-11-16: qty 1

## 2019-11-16 NOTE — ED Triage Notes (Signed)
Pt BIB [facility] from [facility] [place] c/o stroke like symptoms. Pt woke up at 11 am with a headache above her right ear and right hand numbness. Pt states she still has a headache and numbness in her right finger tips. Pt has a hx of TIAs.

## 2019-11-16 NOTE — ED Notes (Signed)
Makayla Fisher or [name], [facility], [phone] would like an update when available

## 2019-11-16 NOTE — ED Provider Notes (Signed)
[facility] EMERGENCY DEPARTMENT Provider Note   CSN: [id number] Arrival date & time: 11/16/19  1904     History Chief Complaint  Patient presents with   Hypertension   Headache   Numbness    Makayla Fisher is a 86 y.o. female.  Pt presents to the ED today with headache and elevated blood pressure.  Pt said she also has some left hand numbness which she noticed when she woke up this afternoon.  Pt said she normally sleeps to noon.  She said she did not eat today either because she missed lunch at the facility.  Pt does not usually have headaches.        Past Medical History:  Diagnosis Date   GERD (gastroesophageal reflux disease)    Heart attack ([place]) 2009   History of blood transfusion 2009   Iron deficiency anemia due to chronic blood loss 08/07/2014   NHL (non-Hodgkin's lymphoma) ([place]) 10/18/2013   finished chemo dec 2015, maintenanance retuxin    Patient Active Problem List   Diagnosis Date Noted   TIA (transient ischemic attack) 04/30/2017   HLD (hyperlipidemia) 04/30/2017   Hx of CABG x 3/stent 04/30/2017   Bilateral carotid artery stenosis 04/30/2017   Non Hodgkin's lymphoma ([place]) 04/30/2017   Hypothyroidism 04/30/2017   Hypertension 04/30/2017   S/P colostomy ([place]) 04/30/2017   Anxiety disorder 04/30/2017   Abnormal urinalysis 04/30/2017   CAP (community acquired pneumonia) 12/13/2016   Acute lower UTI 12/13/2016   Glaucoma 12/13/2016   Colostomy in place ([place]) 12/13/2016   Vitamin D deficiency 67/03/4579   Follicular lymphoma grade IIIa of intra-abdominal lymph nodes ([place]) 04/22/2016   Iron deficiency anemia due to chronic blood loss 08/07/2014   HTN (hypertension) 07/15/2014   Unilateral carotid artery disease ([place]) 07/15/2014   Bradycardia 07/15/2014   Colon stricture ([place]) 06/13/2014   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   Preop cardiovascular exam 04/08/2014   NHL (non-Hodgkin's lymphoma)  ([place]) 10/18/2013   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     Family History  Problem Relation Age of Onset   Diverticulitis Mother    Heart attack Mother    Ulcers Father     Social History   Tobacco Use   Smoking status: Former Smoker    Packs/day: 1.00    Years: 25.00    Pack years: 25.00    Types: Cigarettes    Start date: 05/16/1948    Quit date: 05/17/1963    Years since quitting: 56.5   Smokeless tobacco: Never Used   Tobacco comment: quit smoking 50 years ago  Vaping Use   Vaping Use: Never used  Substance Use Topics   Alcohol use: No    Alcohol/week: 0.0 standard drinks   Drug use: No    Home  Medications Prior to Admission medications   Medication Sig Start Date End Date Taking? Authorizing Provider  aspirin EC 81 MG EC tablet Take 1 tablet (81 mg total) by mouth daily. 05/02/17  Yes Geradine Girt, DO  Calcium Carb-Cholecalciferol (CALCIUM 600+D3 PO) Take 1 tablet by mouth at bedtime.   Yes [provider]  Cholecalciferol (VITAMIN D3 SUPER STRENGTH) 50 MCG (2000 UT) CAPS Take 2,000 Units by mouth daily.   Yes [provider]  Cranberry 425 MG CAPS Take 425 mg by mouth daily.    Yes [provider]  levothyroxine (SYNTHROID, LEVOTHROID) 50 MCG tablet Take 50 mcg by mouth every morning. 10/08/15  Yes [provider]    lidocaine-prilocaine (EMLA) cream Apply 1 application topically See admin instructions. Apply to port-a-cath one hour prior to procedure 09/16/19  Yes [provider]  lisinopril (PRINIVIL,ZESTRIL) 20 MG tablet Take 1 tablet (20 mg total) by mouth daily. 05/10/18  Yes [name], MD  Multiple Vitamin (MULTIVITAMIN WITH MINERALS) TABS tablet Take 1 tablet by mouth daily at 12 noon.    Yes [provider]  Multiple Vitamins-Minerals (PRESERVISION AREDS) CAPS Take 1 capsule by mouth at bedtime.    Yes [provider]  NEOMYCIN-POLYMYXIN-HYDROCORTISONE (CORTISPORIN) 1 % SOLN OTIC solution Apply 1-2 drops to toe BID after soaking Patient taking differently: 1-2 drops See admin instructions. Apply 1-2 drops to affected toe 2 times a day after soaking 10/31/19  Yes Hyatt, Max T, DPM  nystatin cream (MYCOSTATIN) Apply 1 application topically every 12 (twelve) hours as needed (to rash between the legs and groin).   Yes [provider]  psyllium (METAMUCIL) 58.6 % packet Take 1 packet weekly Patient taking differently: Take 1 packet by mouth every Monday. [place] 05/10/18  Yes Ennever, [name], MD  rOPINIRole (REQUIP) 1 MG tablet Take 1 mg by mouth at bedtime.   Yes [provider]  sertraline (ZOLOFT) 25 MG tablet Take 12.5 mg by mouth every morning.    Yes [provider]  SPIRIVA HANDIHALER 18 MCG inhalation capsule Place 18 mcg into inhaler and inhale daily.  07/23/15  Yes [provider]  SYSTANE COMPLETE 0.6 % SOLN Place 1 drop into both eyes 3 (three) times daily.  08/19/19  Yes [provider]  acetaminophen (TYLENOL 8 HOUR) 650 MG CR tablet Take 1 tablet (650 mg total) by mouth every 8 (eight) hours as needed for pain. Patient not taking: Reported on 11/16/2019 05/31/17   Varney Biles, MD  Glycerin-Polysorbate 80 (REFRESH DRY EYE THERAPY OP) Place 2 drops into both eyes daily. Patient not taking: Reported on 11/16/2019 07/19/19    [provider]  olopatadine (PATANOL) 0.1 % ophthalmic solution Place 1 drop into both eyes daily. Patient not taking: Reported on 11/16/2019 07/05/19   [provider]  Saccharomyces boulardii (PROBIOTIC) 250 MG CAPS Take 250 mg by mouth 2 (two) times daily. Patient not taking: Reported on 11/16/2019 05/31/17   Varney Biles, MD  vitamin B-12 (CYANOCOBALAMIN) 1000 MCG tablet Take 1,000 mcg by mouth daily. Patient not taking: Reported on 11/16/2019 06/28/19   [provider]    Allergies    Epipen [epinephrine hcl (nasal)], Levaquin [levofloxacin in d5w], Chlorhexidine gluconate, Heparin, and Sulfa antibiotics  Review of Systems   Review of Systems  Neurological: Positive for numbness and headaches.  All other systems reviewed and are negative.   Physical Exam Updated Vital Signs BP (!) 178/91 (BP Location: Right Arm)  Pulse 69    Temp 98.6 F (37 C) (Oral)    Resp 23    SpO2 95%   Physical Exam Vitals and nursing note reviewed.  Constitutional:      Appearance: She is well-developed.  HENT:     Head: Normocephalic and atraumatic.     Mouth/Throat:     Mouth: Mucous membranes are moist.     Pharynx: Oropharynx is clear.  Eyes:     Extraocular Movements: Extraocular movements intact.     Pupils: Pupils are equal, round, and reactive to light.  Cardiovascular:     Rate and Rhythm: Normal rate and regular rhythm.  Pulmonary:     Effort: Pulmonary effort is normal.     Breath sounds: Normal breath sounds.  Abdominal:     General: Bowel sounds are normal.     Palpations: Abdomen is soft.  Musculoskeletal:        General: Normal range of motion.     Cervical back: Normal range of motion and neck supple.  Skin:    General: Skin is warm and dry.     Capillary Refill: Capillary refill takes less than 2 seconds.  Neurological:     Mental Status: She is alert and oriented to person, place, and time.  Psychiatric:        Mood and Affect: Mood normal.         Behavior: Behavior normal.     ED Results / Procedures / Treatments   Labs (all labs ordered are listed, but only abnormal results are displayed) Labs Reviewed  BASIC METABOLIC PANEL - Abnormal; Notable for the following components:      Result Value   Glucose, Bld 113 (*)    GFR calc non Af Amer 55 (*)    All other components within normal limits  CBC - Abnormal; Notable for the following components:   Platelets 148 (*)    All other components within normal limits  SEDIMENTATION RATE    EKG EKG Interpretation  Date/Time:  Saturday November 16 2019 19:07:34 EDT Ventricular Rate:  96 PR Interval:    QRS Duration: 84 QT Interval:  339 QTC Calculation: 429 R Axis:   16 Text Interpretation: Sinus rhythm Atrial premature complex Abnormal R-wave progression, early transition No significant change since last tracing Confirmed by Isla Pence [id number]) on 11/16/2019 8:14:26 PM   Radiology CT Head Wo Contrast  Result Date: 11/16/2019 CLINICAL DATA:  Headache since 11 a.m., right hand numbness EXAM: CT HEAD WITHOUT CONTRAST TECHNIQUE: Contiguous axial images were obtained from the base of the skull through the vertex without intravenous contrast. COMPARISON:  04/30/2017 FINDINGS: Brain: Confluent hypodensities are again seen scattered throughout the periventricular and subcortical white matter consistent with chronic small vessel ischemic changes. No evidence of acute infarct or hemorrhage. Lateral ventricles and remaining midline structures are unremarkable. No acute extra-axial fluid collections. No mass effect. Vascular: No hyperdense vessel or unexpected calcification. Skull: Normal. Negative for fracture or focal lesion. Sinuses/Orbits: No acute finding. Other: None. IMPRESSION: 1. Stable chronic small-vessel ischemic changes throughout the white matter. No acute intracranial process. Electronically Signed   By: Randa Ngo M.D.   On: 11/16/2019 20:23    Procedures Procedures  (including critical care time)  Medications Ordered in ED Medications  0.9 %  sodium chloride infusion ( Intravenous New Bag/Given 11/16/19 2217)  rOPINIRole (REQUIP) tablet 1 mg (has no administration in time range)  metoCLOPramide (REGLAN) injection 10 mg (10 mg Intravenous Given 11/16/19 2203)  diphenhydrAMINE (BENADRYL) injection 12.5 mg (12.5 mg Intravenous Given 11/16/19 2159)  metoprolol tartrate (LOPRESSOR) injection 5 mg (5 mg Intravenous Given 11/16/19 2206)  morphine 2 MG/ML injection 2 mg (2 mg Intravenous Given 11/16/19 2331)    ED Course  I have reviewed the triage vital signs and the nursing notes.  Pertinent labs & imaging results that were available during my care of the patient were reviewed by me and considered in my medical decision making (see chart for details).    MDM Rules/Calculators/A&P                          Headache has improved, but pt still c/o numbness to her left hand.  MRI ordered.  Pt signed out to Dr. Christy Gentles at shift change.  Final Clinical Impression(s) / ED Diagnoses Final diagnoses:  Essential hypertension  Acute nonintractable headache, unspecified headache type    Rx / DC Orders ED Discharge Orders    None       Isla Pence, MD 11/16/19 2353

## 2019-11-16 NOTE — ED Notes (Signed)
Med given pt sleepy  sats dropped to 86  Nasal 02 attached at 3 liters

## 2019-11-17 ENCOUNTER — Emergency Department (HOSPITAL_COMMUNITY): Payer: Medicare Other

## 2019-11-17 DIAGNOSIS — I10 Essential (primary) hypertension: Secondary | ICD-10-CM | POA: Diagnosis not present

## 2019-11-17 MED ORDER — ACETAMINOPHEN 325 MG PO TABS
650.0000 mg | ORAL_TABLET | Freq: Once | ORAL | Status: AC
  Administered 2019-11-17: 650 mg via ORAL
  Filled 2019-11-17: qty 2

## 2019-11-17 MED ORDER — LORAZEPAM 2 MG/ML IJ SOLN
0.5000 mg | Freq: Once | INTRAMUSCULAR | Status: AC
  Administered 2019-11-17: 0.5 mg via INTRAMUSCULAR
  Filled 2019-11-17: qty 1

## 2019-11-17 NOTE — ED Notes (Signed)
[facility]tar called 

## 2019-11-17 NOTE — ED Provider Notes (Signed)
I assumed care in signout to follow-up on MRI.  No acute stroke on MRI.  Patient is awake alert in no acute distress.  She moves all extremities x4.  Patient does report mild headache but otherwise is well-appearing.  She will be discharged back to facility   Ripley Fraise, MD 11/17/19 [phone]

## 2019-11-17 NOTE — ED Notes (Signed)
Pt transported to MRI 

## 2019-11-19 ENCOUNTER — Ambulatory Visit: Payer: TRICARE For Life (TFL) | Admitting: Podiatry

## 2019-11-20 ENCOUNTER — Other Ambulatory Visit: Payer: Self-pay | Admitting: *Deleted

## 2019-11-20 DIAGNOSIS — C8253 Diffuse follicle center lymphoma, intra-abdominal lymph nodes: Secondary | ICD-10-CM

## 2019-11-21 ENCOUNTER — Encounter: Payer: Self-pay | Admitting: Hematology & Oncology

## 2019-11-21 ENCOUNTER — Inpatient Hospital Stay (HOSPITAL_BASED_OUTPATIENT_CLINIC_OR_DEPARTMENT_OTHER): Payer: Medicare Other | Admitting: Hematology & Oncology

## 2019-11-21 ENCOUNTER — Other Ambulatory Visit: Payer: Self-pay

## 2019-11-21 ENCOUNTER — Inpatient Hospital Stay: Payer: Medicare Other | Attending: Hematology & Oncology

## 2019-11-21 ENCOUNTER — Inpatient Hospital Stay: Payer: Medicare Other

## 2019-11-21 VITALS — BP 196/65 | HR 77 | Temp 97.7°F | Resp 18

## 2019-11-21 VITALS — BP 166/81 | HR 71

## 2019-11-21 DIAGNOSIS — Z8572 Personal history of non-Hodgkin lymphomas: Secondary | ICD-10-CM | POA: Diagnosis present

## 2019-11-21 DIAGNOSIS — D500 Iron deficiency anemia secondary to blood loss (chronic): Secondary | ICD-10-CM | POA: Diagnosis not present

## 2019-11-21 DIAGNOSIS — E038 Other specified hypothyroidism: Secondary | ICD-10-CM | POA: Diagnosis not present

## 2019-11-21 DIAGNOSIS — D508 Other iron deficiency anemias: Secondary | ICD-10-CM | POA: Insufficient documentation

## 2019-11-21 DIAGNOSIS — Z7982 Long term (current) use of aspirin: Secondary | ICD-10-CM | POA: Diagnosis not present

## 2019-11-21 DIAGNOSIS — K909 Intestinal malabsorption, unspecified: Secondary | ICD-10-CM | POA: Insufficient documentation

## 2019-11-21 DIAGNOSIS — Z452 Encounter for adjustment and management of vascular access device: Secondary | ICD-10-CM | POA: Insufficient documentation

## 2019-11-21 DIAGNOSIS — Z933 Colostomy status: Secondary | ICD-10-CM | POA: Diagnosis not present

## 2019-11-21 DIAGNOSIS — Z79899 Other long term (current) drug therapy: Secondary | ICD-10-CM | POA: Diagnosis not present

## 2019-11-21 DIAGNOSIS — C8253 Diffuse follicle center lymphoma, intra-abdominal lymph nodes: Secondary | ICD-10-CM

## 2019-11-21 DIAGNOSIS — Z95828 Presence of other vascular implants and grafts: Secondary | ICD-10-CM

## 2019-11-21 LAB — COMPREHENSIVE METABOLIC PANEL
ALT: 12 U/L (ref 0–44)
AST: 24 U/L (ref 15–41)
Albumin: 3.6 g/dL (ref 3.5–5.0)
Alkaline Phosphatase: 56 U/L (ref 38–126)
Anion gap: 12 (ref 5–15)
BUN: 15 mg/dL (ref 8–23)
CO2: 25 mmol/L (ref 22–32)
Calcium: 9.2 mg/dL (ref 8.9–10.3)
Chloride: 102 mmol/L (ref 98–111)
Creatinine, Ser: 0.83 mg/dL (ref 0.44–1.00)
GFR calc Af Amer: >60 mL/min (ref >60)
GFR calc non Af Amer: >60 mL/min (ref >60)
Glucose, Bld: 100 mg/dL — ABNORMAL HIGH (ref 70–99)
Potassium: 3.6 mmol/L (ref 3.5–5.1)
Sodium: 139 mmol/L (ref 135–145)
Total Bilirubin: 0.5 mg/dL (ref 0.3–1.2)
Total Protein: 6.1 g/dL — ABNORMAL LOW (ref 6.5–8.1)

## 2019-11-21 LAB — CBC WITH DIFFERENTIAL (CANCER CENTER ONLY)
Abs Immature Granulocytes: 0.05 10*3/uL (ref 0.00–0.07)
Basophils Absolute: 0.1 10*3/uL (ref 0.0–0.1)
Basophils Relative: 1 %
Eosinophils Absolute: 0.2 10*3/uL (ref 0.0–0.5)
Eosinophils Relative: 3 %
HCT: 39.8 % (ref 36.0–46.0)
Hemoglobin: 12.7 g/dL (ref 12.0–15.0)
Immature Granulocytes: 1 %
Lymphocytes Relative: 25 %
Lymphs Abs: 1.8 10*3/uL (ref 0.7–4.0)
MCH: 30.6 pg (ref 26.0–34.0)
MCHC: 31.9 g/dL (ref 30.0–36.0)
MCV: 95.9 fL (ref 80.0–100.0)
Monocytes Absolute: 0.7 10*3/uL (ref 0.1–1.0)
Monocytes Relative: 10 %
Neutro Abs: 4.4 10*3/uL (ref 1.7–7.7)
Neutrophils Relative %: 60 %
Platelet Count: 155 10*3/uL (ref 150–400)
RBC: 4.15 MIL/uL (ref 3.87–5.11)
RDW: 13.5 % (ref 11.5–15.5)
WBC Count: 7.2 10*3/uL (ref 4.0–10.5)
nRBC: 0 % (ref 0.0–0.2)

## 2019-11-21 MED ORDER — HEPARIN SOD (PORK) LOCK FLUSH 100 UNIT/ML IV SOLN
500.0000 [IU] | Freq: Once | INTRAVENOUS | Status: AC
  Administered 2019-11-21: 500 [IU] via INTRAVENOUS
  Filled 2019-11-21: qty 5

## 2019-11-21 MED ORDER — SODIUM CHLORIDE 0.9% FLUSH
10.0000 mL | Freq: Once | INTRAVENOUS | Status: AC
  Administered 2019-11-21: 10 mL via INTRAVENOUS
  Filled 2019-11-21: qty 10

## 2019-11-21 NOTE — Patient Instructions (Signed)

## 2019-11-21 NOTE — Progress Notes (Signed)
Hematology and Oncology Follow Up Visit  Makayla Fisher [id number] 16-Sep-1922 86 y.o. 11/21/2019   Principle Diagnosis:  Follicular large cell non-Hodgkin's lymphoma Iron deficiency anemia secondary to malabsorption  Current Therapy:   Status post cycle #8 of maintenance Rituxan - complete in June 2018 IV iron-Feraheme as indicated     Interim History:  [name] is back for followup.  She is having some difficulty.  She is very tired.  She says she just does not have any energy.  Unfortunately, there really is no family doctor that she sees.  She is has had a nurse comes around once a month to see how she is doing.  I am not sure when her TSH was last checked.  She is on Synthroid.  I would not think that her iron levels are on the low side.  She is not anemic.  Her iron studies that were done back in April showed a ferritin of 137 with iron saturation of 29%.  Her colostomy is working okay.  She really has not lost weight.  She is worried about her lymphoma coming back.  I just do not think that that is going to be a problem for [place].  Even if the lymphoma did come back, there really is no treatment for her since her performance status is not that great and she would not be able to tolerate treatment.    Currently, I would say her performance status is ECOG 3.     Medications:  Current Outpatient Medications:  .  acetaminophen (TYLENOL 8 HOUR) 650 MG CR tablet, Take 1 tablet (650 mg total) by mouth every 8 (eight) hours as needed for pain. (Patient not taking: Reported on 11/16/2019), Disp: 30 tablet, Rfl: 0 .  aspirin EC 81 MG EC tablet, Take 1 tablet (81 mg total) by mouth daily., Disp: , Rfl:  .  Calcium Carb-Cholecalciferol (CALCIUM 600+D3 PO), Take 1 tablet by mouth at bedtime., Disp: , Rfl:  .  Cholecalciferol (VITAMIN D3 SUPER STRENGTH) 50 MCG (2000 UT) CAPS, Take 2,000 Units by mouth daily., Disp: , Rfl:  .  Cranberry 425 MG CAPS, Take 425 mg by mouth daily. , Disp: ,  Rfl:  .  Glycerin-Polysorbate 80 (REFRESH DRY EYE THERAPY OP), Place 2 drops into both eyes daily. (Patient not taking: Reported on 11/16/2019), Disp: , Rfl:  .  levothyroxine (SYNTHROID, LEVOTHROID) 50 MCG tablet, Take 50 mcg by mouth every morning., Disp: , Rfl:  .  lidocaine-prilocaine (EMLA) cream, Apply 1 application topically See admin instructions. Apply to port-a-cath one hour prior to procedure, Disp: , Rfl:  .  lisinopril (PRINIVIL,ZESTRIL) 20 MG tablet, Take 1 tablet (20 mg total) by mouth daily., Disp: 30 tablet, Rfl: 3 .  Multiple Vitamin (MULTIVITAMIN WITH MINERALS) TABS tablet, Take 1 tablet by mouth daily at 12 noon. , Disp: , Rfl:  .  Multiple Vitamins-Minerals (PRESERVISION AREDS) CAPS, Take 1 capsule by mouth at bedtime. , Disp: , Rfl:  .  NEOMYCIN-POLYMYXIN-HYDROCORTISONE (CORTISPORIN) 1 % SOLN OTIC solution, Apply 1-2 drops to toe BID after soaking (Patient taking differently: 1-2 drops See admin instructions. Apply 1-2 drops to affected toe 2 times a day after soaking), Disp: 10 mL, Rfl: 1 .  nystatin cream (MYCOSTATIN), Apply 1 application topically every 12 (twelve) hours as needed (to rash between the legs and groin)., Disp: , Rfl:  .  olopatadine (PATANOL) 0.1 % ophthalmic solution, Place 1 drop into both eyes daily. (Patient not taking: Reported on 11/16/2019),  Disp: , Rfl:  .  psyllium (METAMUCIL) 58.6 % packet, Take 1 packet weekly (Patient taking differently: Take 1 packet by mouth every Monday. ORANGE), Disp: 30 each, Rfl: 4 .  rOPINIRole (REQUIP) 1 MG tablet, Take 1 mg by mouth at bedtime., Disp: , Rfl:  .  Saccharomyces boulardii (PROBIOTIC) 250 MG CAPS, Take 250 mg by mouth 2 (two) times daily. (Patient not taking: Reported on 11/16/2019), Disp: 14 capsule, Rfl: 0 .  sertraline (ZOLOFT) 25 MG tablet, Take 12.5 mg by mouth every morning. , Disp: , Rfl:  .  SPIRIVA HANDIHALER 18 MCG inhalation capsule, Place 18 mcg into inhaler and inhale daily. , Disp: , Rfl:  .  SYSTANE  COMPLETE 0.6 % SOLN, Place 1 drop into both eyes 3 (three) times daily. , Disp: , Rfl:  .  vitamin B-12 (CYANOCOBALAMIN) 1000 MCG tablet, Take 1,000 mcg by mouth daily. (Patient not taking: Reported on 11/16/2019), Disp: , Rfl:   Allergies:  Allergies  Allergen Reactions  . Epipen [Epinephrine Hcl (Nasal)] Palpitations  . Levaquin [Levofloxacin In D5w] Other (See Comments)    Weakness- "Allergic," per MAR  . Chlorhexidine Gluconate Other (See Comments)    Pt declined use and suspects she may have an intolerance  . Heparin Other (See Comments)    "Alleregic," pre MAR- Maybe affected platelets, per patient  . Sulfa Antibiotics Rash    Past Medical History, Surgical history, Social history, and Family History were reviewed and updated.  Review of Systems: Review of Systems  Constitutional: Negative.   HENT: Negative.   Eyes: Negative.   Respiratory: Positive for shortness of breath.   Cardiovascular: Positive for palpitations.  Gastrointestinal: Positive for diarrhea and nausea.  Genitourinary: Negative.   Musculoskeletal: Positive for joint pain and myalgias.  Skin: Negative.   Neurological: Positive for tingling.  Endo/Heme/Allergies: Negative.   Psychiatric/Behavioral: Negative.      Physical Exam:  weight is 184 lb (83.5 kg) (pended). Her oral temperature is 97.7 F (36.5 C) (pended). Her blood pressure is 166/81 (abnormal) and her pulse is 71. Her respiration is 18 (pended) and oxygen saturation is 98% (pended).   Physical Exam Vitals reviewed.  HENT:     Head: Normocephalic and atraumatic.  Eyes:     Pupils: Pupils are equal, round, and reactive to light.  Cardiovascular:     Rate and Rhythm: Normal rate and regular rhythm.     Heart sounds: Normal heart sounds.  Pulmonary:     Effort: Pulmonary effort is normal.     Breath sounds: Normal breath sounds.  Abdominal:     General: Bowel sounds are normal.     Palpations: Abdomen is soft.     Comments: Abdominal  exam shows a colostomy in the left lower quadrant.  She has multiple surgical scars.  She has no fluid wave.  There is no guarding or rebound tenderness.  There is no palpable liver or spleen tip.  Musculoskeletal:        General: No tenderness or deformity. Normal range of motion.     Cervical back: Normal range of motion.  Lymphadenopathy:     Cervical: No cervical adenopathy.  Skin:    General: Skin is warm and dry.     Findings: No erythema or rash.  Neurological:     Mental Status: She is alert and oriented to person, place, and time.  Psychiatric:        Behavior: Behavior normal.        Thought  Content: Thought content normal.        Judgment: Judgment normal.      Lab Results  Component Value Date   WBC 7.2 11/21/2019   HGB 12.7 11/21/2019   HCT 39.8 11/21/2019   MCV 95.9 11/21/2019   PLT 155 11/21/2019     Chemistry      Component Value Date/Time   NA 139 11/21/2019 1340   NA 143 01/31/2017 0931   NA 141 05/08/2014 1041   K 3.6 11/21/2019 1340   K 3.7 01/31/2017 0931   K 4.5 05/08/2014 1041   CL 102 11/21/2019 1340   CL 106 01/31/2017 0931   CO2 25 11/21/2019 1340   CO2 27 01/31/2017 0931   CO2 24 05/08/2014 1041   BUN 15 11/21/2019 1340   BUN 10 01/31/2017 0931   BUN 14.2 05/08/2014 1041   CREATININE 0.83 11/21/2019 1340   CREATININE 1.11 (H) 08/20/2019 1445   CREATININE 0.9 01/31/2017 0931   CREATININE 0.8 05/08/2014 1041      Component Value Date/Time   CALCIUM 9.2 11/21/2019 1340   CALCIUM 9.7 01/31/2017 0931   CALCIUM 9.2 05/08/2014 1041   ALKPHOS 56 11/21/2019 1340   ALKPHOS 57 01/31/2017 0931   ALKPHOS 66 05/08/2014 1041   AST 24 11/21/2019 1340   AST 14 (L) 08/20/2019 1445   AST 23 05/08/2014 1041   ALT 12 11/21/2019 1340   ALT 8 08/20/2019 1445   ALT 14 01/31/2017 0931   ALT 10 05/08/2014 1041   BILITOT 0.5 11/21/2019 1340   BILITOT 0.3 08/20/2019 1445   BILITOT 0.42 05/08/2014 1041      Impression and Plan: Ms. Campa is  86 year old female. She has a follicular large cell lymphoma. I do not see any evidence of recurrent disease. I do not believe that she is at significant risk for occurrence.   We will have to check her thyroid today.  We will see what her iron studies show.  I just feel bad that she just does not feel that well.  Again, she is 86 years old.  This certainly may have some to do with her not feeling well.  As always, we will plan to get her back in 6 months.    [name], MD 7/29/20212:55 PM

## 2019-11-22 ENCOUNTER — Telehealth: Payer: Self-pay | Admitting: *Deleted

## 2019-11-22 LAB — IRON AND TIBC
Iron: 50 ug/dL (ref 41–142)
Saturation Ratios: 19 % — ABNORMAL LOW (ref 21–57)
TIBC: 264 ug/dL (ref 236–444)
UIBC: 214 ug/dL (ref 120–384)

## 2019-11-22 LAB — FERRITIN: Ferritin: 73 ng/mL (ref 11–307)

## 2019-11-22 LAB — TSH: TSH: 2.879 u[IU]/mL (ref 0.308–3.960)

## 2019-11-22 NOTE — Telephone Encounter (Signed)
Pt notified per order of Dr. Marin Olp that "the thyroid is ok, but the iron is low and she needs to come in for one dose of IV iron."  Pt appreciative of call and transferred to scheduling.

## 2019-11-22 NOTE — Telephone Encounter (Signed)
-----   Message from [name], MD sent at 11/22/2019 12:04 PM EDT ----- Call - the htyroid is ok, but the iron is low!!  She needs to come in  for 1 dose of IV Iron!!  Laurey Arrow

## 2019-11-26 ENCOUNTER — Ambulatory Visit (INDEPENDENT_AMBULATORY_CARE_PROVIDER_SITE_OTHER): Payer: Medicare Other | Admitting: Podiatry

## 2019-11-26 ENCOUNTER — Other Ambulatory Visit: Payer: Self-pay

## 2019-11-26 DIAGNOSIS — M79676 Pain in unspecified toe(s): Secondary | ICD-10-CM

## 2019-11-26 DIAGNOSIS — B351 Tinea unguium: Secondary | ICD-10-CM

## 2019-11-26 NOTE — Progress Notes (Signed)
She presents today for follow-up of her slant back on her hallux right.  She states that is doing much better and I would like to have all of my other toenails cut.  Objective: Toenails are long thick yellow dystrophic-like mycotic and painful on palpation.  Assessment: Pain in limb secondary to onychomycosis.  Plan: Debridement of toenails 1 through 5 bilateral.

## 2019-11-27 ENCOUNTER — Other Ambulatory Visit: Payer: Self-pay

## 2019-11-27 ENCOUNTER — Inpatient Hospital Stay (HOSPITAL_COMMUNITY): Payer: Medicare Other

## 2019-11-27 ENCOUNTER — Inpatient Hospital Stay (HOSPITAL_COMMUNITY)
Admission: EM | Admit: 2019-11-27 | Discharge: 2019-11-28 | DRG: 062 | Disposition: A | Discharge status: Home / Self Care | Payer: Medicare Other | Source: Skilled Nursing Facility | Attending: Neurology | Admitting: Neurology

## 2019-11-27 ENCOUNTER — Emergency Department (HOSPITAL_COMMUNITY): Payer: Medicare Other

## 2019-11-27 DIAGNOSIS — I10 Essential (primary) hypertension: Secondary | ICD-10-CM | POA: Diagnosis present

## 2019-11-27 DIAGNOSIS — Z888 Allergy status to other drugs, medicaments and biological substances status: Secondary | ICD-10-CM

## 2019-11-27 DIAGNOSIS — I252 Old myocardial infarction: Secondary | ICD-10-CM | POA: Diagnosis not present

## 2019-11-27 DIAGNOSIS — E785 Hyperlipidemia, unspecified: Secondary | ICD-10-CM | POA: Diagnosis present

## 2019-11-27 DIAGNOSIS — R2990 Unspecified symptoms and signs involving the nervous system: Secondary | ICD-10-CM

## 2019-11-27 DIAGNOSIS — Z955 Presence of coronary angioplasty implant and graft: Secondary | ICD-10-CM | POA: Diagnosis not present

## 2019-11-27 DIAGNOSIS — H9190 Unspecified hearing loss, unspecified ear: Secondary | ICD-10-CM | POA: Diagnosis present

## 2019-11-27 DIAGNOSIS — I340 Nonrheumatic mitral (valve) insufficiency: Secondary | ICD-10-CM

## 2019-11-27 DIAGNOSIS — Z882 Allergy status to sulfonamides status: Secondary | ICD-10-CM | POA: Diagnosis not present

## 2019-11-27 DIAGNOSIS — Z933 Colostomy status: Secondary | ICD-10-CM | POA: Diagnosis not present

## 2019-11-27 DIAGNOSIS — R4182 Altered mental status, unspecified: Secondary | ICD-10-CM | POA: Diagnosis not present

## 2019-11-27 DIAGNOSIS — Z951 Presence of aortocoronary bypass graft: Secondary | ICD-10-CM | POA: Diagnosis not present

## 2019-11-27 DIAGNOSIS — C8590 Non-Hodgkin lymphoma, unspecified, unspecified site: Secondary | ICD-10-CM | POA: Diagnosis present

## 2019-11-27 DIAGNOSIS — E039 Hypothyroidism, unspecified: Secondary | ICD-10-CM | POA: Diagnosis present

## 2019-11-27 DIAGNOSIS — I161 Hypertensive emergency: Secondary | ICD-10-CM | POA: Diagnosis present

## 2019-11-27 DIAGNOSIS — I639 Cerebral infarction, unspecified: Secondary | ICD-10-CM | POA: Diagnosis present

## 2019-11-27 DIAGNOSIS — Z881 Allergy status to other antibiotic agents status: Secondary | ICD-10-CM

## 2019-11-27 DIAGNOSIS — J439 Emphysema, unspecified: Secondary | ICD-10-CM | POA: Diagnosis present

## 2019-11-27 DIAGNOSIS — G459 Transient cerebral ischemic attack, unspecified: Secondary | ICD-10-CM | POA: Diagnosis present

## 2019-11-27 DIAGNOSIS — Z7982 Long term (current) use of aspirin: Secondary | ICD-10-CM

## 2019-11-27 DIAGNOSIS — D509 Iron deficiency anemia, unspecified: Secondary | ICD-10-CM | POA: Diagnosis present

## 2019-11-27 DIAGNOSIS — Z87891 Personal history of nicotine dependence: Secondary | ICD-10-CM

## 2019-11-27 DIAGNOSIS — K219 Gastro-esophageal reflux disease without esophagitis: Secondary | ICD-10-CM | POA: Diagnosis present

## 2019-11-27 DIAGNOSIS — R569 Unspecified convulsions: Secondary | ICD-10-CM | POA: Diagnosis not present

## 2019-11-27 DIAGNOSIS — Z20822 Contact with and (suspected) exposure to covid-19: Secondary | ICD-10-CM | POA: Diagnosis present

## 2019-11-27 DIAGNOSIS — E669 Obesity, unspecified: Secondary | ICD-10-CM

## 2019-11-27 DIAGNOSIS — G43909 Migraine, unspecified, not intractable, without status migrainosus: Secondary | ICD-10-CM | POA: Diagnosis present

## 2019-11-27 DIAGNOSIS — Z8669 Personal history of other diseases of the nervous system and sense organs: Secondary | ICD-10-CM

## 2019-11-27 HISTORY — DX: Cerebral infarction, unspecified: I63.9

## 2019-11-27 LAB — URINALYSIS, ROUTINE W REFLEX MICROSCOPIC
Bacteria, UA: NONE SEEN
Bilirubin Urine: NEGATIVE
Glucose, UA: NEGATIVE mg/dL
Hgb urine dipstick: NEGATIVE
Ketones, ur: NEGATIVE mg/dL
Nitrite: NEGATIVE
Protein, ur: 30 mg/dL — AB
Specific Gravity, Urine: 1.013 (ref 1.005–1.030)
pH: 7 (ref 5.0–8.0)

## 2019-11-27 LAB — COMPREHENSIVE METABOLIC PANEL
ALT: 15 U/L (ref 0–44)
AST: 26 U/L (ref 15–41)
Albumin: 3.8 g/dL (ref 3.5–5.0)
Alkaline Phosphatase: 63 U/L (ref 38–126)
Anion gap: 10 (ref 5–15)
BUN: 13 mg/dL (ref 8–23)
CO2: 26 mmol/L (ref 22–32)
Calcium: 10.1 mg/dL (ref 8.9–10.3)
Chloride: 102 mmol/L (ref 98–111)
Creatinine, Ser: 0.94 mg/dL (ref 0.44–1.00)
GFR calc Af Amer: >60 mL/min (ref >60)
GFR calc non Af Amer: 52 mL/min — ABNORMAL LOW (ref >60)
Glucose, Bld: 150 mg/dL — ABNORMAL HIGH (ref 70–99)
Potassium: 3.7 mmol/L (ref 3.5–5.1)
Sodium: 138 mmol/L (ref 135–145)
Total Bilirubin: 0.8 mg/dL (ref 0.3–1.2)
Total Protein: 6.3 g/dL — ABNORMAL LOW (ref 6.5–8.1)

## 2019-11-27 LAB — CBC
HCT: 43.3 % (ref 36.0–46.0)
Hemoglobin: 13.7 g/dL (ref 12.0–15.0)
MCH: 30.4 pg (ref 26.0–34.0)
MCHC: 31.6 g/dL (ref 30.0–36.0)
MCV: 96 fL (ref 80.0–100.0)
Platelets: 148 10*3/uL — ABNORMAL LOW (ref 150–400)
RBC: 4.51 MIL/uL (ref 3.87–5.11)
RDW: 13.7 % (ref 11.5–15.5)
WBC: 8.6 10*3/uL (ref 4.0–10.5)
nRBC: 0 % (ref 0.0–0.2)

## 2019-11-27 LAB — LIPID PANEL
Cholesterol: 213 mg/dL — ABNORMAL HIGH (ref 0–200)
HDL: 58 mg/dL (ref >40)
LDL Cholesterol: 136 mg/dL — ABNORMAL HIGH (ref 0–99)
Total CHOL/HDL Ratio: 3.7 RATIO
Triglycerides: 97 mg/dL (ref <150)
VLDL: 19 mg/dL (ref 0–40)

## 2019-11-27 LAB — DIFFERENTIAL
Abs Immature Granulocytes: 0.04 10*3/uL (ref 0.00–0.07)
Basophils Absolute: 0.1 10*3/uL (ref 0.0–0.1)
Basophils Relative: 1 %
Eosinophils Absolute: 0.3 10*3/uL (ref 0.0–0.5)
Eosinophils Relative: 3 %
Immature Granulocytes: 1 %
Lymphocytes Relative: 34 %
Lymphs Abs: 2.9 10*3/uL (ref 0.7–4.0)
Monocytes Absolute: 0.8 10*3/uL (ref 0.1–1.0)
Monocytes Relative: 10 %
Neutro Abs: 4.6 10*3/uL (ref 1.7–7.7)
Neutrophils Relative %: 51 %

## 2019-11-27 LAB — ECHOCARDIOGRAM COMPLETE
Area-P 1/2: 2.8 cm2
S' Lateral: 2.9 cm
Weight: 2998.26 oz

## 2019-11-27 LAB — MRSA PCR SCREENING: MRSA by PCR: POSITIVE — AB

## 2019-11-27 LAB — I-STAT CHEM 8, ED
BUN: 14 mg/dL (ref 8–23)
Calcium, Ion: 1.13 mmol/L — ABNORMAL LOW (ref 1.15–1.40)
Chloride: 101 mmol/L (ref 98–111)
Creatinine, Ser: 0.9 mg/dL (ref 0.44–1.00)
Glucose, Bld: 149 mg/dL — ABNORMAL HIGH (ref 70–99)
HCT: 44 % (ref 36.0–46.0)
Hemoglobin: 15 g/dL (ref 12.0–15.0)
Potassium: 3.5 mmol/L (ref 3.5–5.1)
Sodium: 138 mmol/L (ref 135–145)
TCO2: 24 mmol/L (ref 22–32)

## 2019-11-27 LAB — CBG MONITORING, ED: Glucose-Capillary: 141 mg/dL — ABNORMAL HIGH (ref 70–99)

## 2019-11-27 LAB — RAPID URINE DRUG SCREEN, HOSP PERFORMED
Amphetamines: NOT DETECTED
Barbiturates: NOT DETECTED
Benzodiazepines: NOT DETECTED
Cocaine: NOT DETECTED
Opiates: NOT DETECTED
Tetrahydrocannabinol: NOT DETECTED

## 2019-11-27 LAB — HEMOGLOBIN A1C
Hgb A1c MFr Bld: 5.4 % (ref 4.8–5.6)
Mean Plasma Glucose: 108.28 mg/dL

## 2019-11-27 LAB — ETHANOL: Alcohol, Ethyl (B): <10 mg/dL (ref <10)

## 2019-11-27 LAB — PROTIME-INR
INR: 1.1 (ref 0.8–1.2)
Prothrombin Time: 13.3 seconds (ref 11.4–15.2)

## 2019-11-27 LAB — SARS CORONAVIRUS 2 BY RT PCR (HOSPITAL ORDER, PERFORMED IN ~~LOC~~ HOSPITAL LAB): SARS Coronavirus 2: NEGATIVE

## 2019-11-27 LAB — APTT: aPTT: 27 seconds (ref 24–36)

## 2019-11-27 MED ORDER — LABETALOL HCL 5 MG/ML IV SOLN
INTRAVENOUS | Status: AC
  Filled 2019-11-27: qty 4

## 2019-11-27 MED ORDER — STROKE: EARLY STAGES OF RECOVERY BOOK
Freq: Once | Status: AC
  Filled 2019-11-27: qty 1

## 2019-11-27 MED ORDER — LABETALOL HCL 5 MG/ML IV SOLN
INTRAVENOUS | Status: AC | PRN
  Administered 2019-11-27: 20 mg via INTRAVENOUS

## 2019-11-27 MED ORDER — TIOTROPIUM BROMIDE MONOHYDRATE 18 MCG IN CAPS
18.0000 ug | ORAL_CAPSULE | Freq: Every day | RESPIRATORY_TRACT | Status: DC
  Filled 2019-11-27: qty 5

## 2019-11-27 MED ORDER — CLEVIDIPINE BUTYRATE 0.5 MG/ML IV EMUL
0.0000 mg/h | INTRAVENOUS | Status: DC
  Administered 2019-11-27 (×2): 2 mg/h via INTRAVENOUS
  Filled 2019-11-27 (×2): qty 50

## 2019-11-27 MED ORDER — POLYVINYL ALCOHOL 1.4 % OP SOLN
1.0000 [drp] | Freq: Three times a day (TID) | OPHTHALMIC | Status: DC
  Administered 2019-11-28 (×3): 1 [drp] via OPHTHALMIC
  Filled 2019-11-27: qty 15

## 2019-11-27 MED ORDER — SERTRALINE HCL 25 MG PO TABS
12.5000 mg | ORAL_TABLET | Freq: Every day | ORAL | Status: DC
  Administered 2019-11-28: 12.5 mg via ORAL
  Filled 2019-11-27 (×2): qty 0.5

## 2019-11-27 MED ORDER — ALTEPLASE (STROKE) FULL DOSE INFUSION
0.9000 mg/kg | Freq: Once | INTRAVENOUS | Status: AC
  Administered 2019-11-27: 76.5 mg via INTRAVENOUS
  Filled 2019-11-27: qty 100

## 2019-11-27 MED ORDER — LABETALOL HCL 5 MG/ML IV SOLN
10.0000 mg | INTRAVENOUS | Status: DC | PRN
  Administered 2019-11-27: 10 mg via INTRAVENOUS
  Filled 2019-11-27: qty 4

## 2019-11-27 MED ORDER — MUPIROCIN 2 % EX OINT
1.0000 "application " | TOPICAL_OINTMENT | Freq: Two times a day (BID) | CUTANEOUS | Status: DC
  Administered 2019-11-27 – 2019-11-28 (×4): 1 via NASAL
  Filled 2019-11-27: qty 22

## 2019-11-27 MED ORDER — PSYLLIUM 95 % PO PACK
1.0000 | PACK | ORAL | Status: DC
  Filled 2019-11-27: qty 1

## 2019-11-27 MED ORDER — ATORVASTATIN CALCIUM 10 MG PO TABS
10.0000 mg | ORAL_TABLET | Freq: Every day | ORAL | Status: DC
  Administered 2019-11-27 – 2019-11-28 (×2): 10 mg via ORAL
  Filled 2019-11-27 (×2): qty 1

## 2019-11-27 MED ORDER — ACETAMINOPHEN 160 MG/5ML PO SOLN: 650.0000 mg | ORAL | Status: DC | PRN

## 2019-11-27 MED ORDER — CLEVIDIPINE BUTYRATE 0.5 MG/ML IV EMUL: 1.0000 mg/h | INTRAVENOUS | Status: DC

## 2019-11-27 MED ORDER — SENNOSIDES-DOCUSATE SODIUM 8.6-50 MG PO TABS
1.0000 | ORAL_TABLET | Freq: Every evening | ORAL | Status: DC | PRN
  Administered 2019-11-27: 1 via ORAL
  Filled 2019-11-27: qty 1

## 2019-11-27 MED ORDER — PROPYLENE GLYCOL 0.6 % OP SOLN: 1.0000 [drp] | Freq: Three times a day (TID) | OPHTHALMIC | Status: DC

## 2019-11-27 MED ORDER — SODIUM CHLORIDE 0.9 % IV SOLN: INTRAVENOUS | Status: DC

## 2019-11-27 MED ORDER — LEVOTHYROXINE SODIUM 50 MCG PO TABS
50.0000 ug | ORAL_TABLET | Freq: Every day | ORAL | Status: DC
  Administered 2019-11-28: 50 ug via ORAL
  Filled 2019-11-27: qty 1

## 2019-11-27 MED ORDER — CLEVIDIPINE BUTYRATE 0.5 MG/ML IV EMUL
INTRAVENOUS | Status: AC | PRN
  Administered 2019-11-27: 1 mg/h via INTRAVENOUS

## 2019-11-27 MED ORDER — ACETAMINOPHEN 325 MG PO TABS
650.0000 mg | ORAL_TABLET | ORAL | Status: DC | PRN
  Administered 2019-11-27: 650 mg via ORAL
  Filled 2019-11-27: qty 2

## 2019-11-27 MED ORDER — LABETALOL HCL 5 MG/ML IV SOLN
10.0000 mg | Freq: Once | INTRAVENOUS | Status: AC
  Administered 2019-11-27: 10 mg via INTRAVENOUS

## 2019-11-27 MED ORDER — UMECLIDINIUM BROMIDE 62.5 MCG/INH IN AEPB
1.0000 | INHALATION_SPRAY | Freq: Every day | RESPIRATORY_TRACT | Status: DC
  Filled 2019-11-27: qty 7

## 2019-11-27 MED ORDER — IOHEXOL 350 MG/ML SOLN
80.0000 mL | Freq: Once | INTRAVENOUS | Status: AC | PRN
  Administered 2019-11-27: 80 mL via INTRAVENOUS

## 2019-11-27 MED ORDER — LISINOPRIL 20 MG PO TABS
20.0000 mg | ORAL_TABLET | Freq: Every day | ORAL | Status: DC
  Administered 2019-11-27 – 2019-11-28 (×2): 20 mg via ORAL
  Filled 2019-11-27 (×2): qty 1

## 2019-11-27 MED ORDER — SODIUM CHLORIDE 0.9 % IV SOLN
50.0000 mL | Freq: Once | INTRAVENOUS | Status: AC
  Administered 2019-11-27: 50 mL via INTRAVENOUS

## 2019-11-27 MED ORDER — ACETAMINOPHEN 650 MG RE SUPP: 650.0000 mg | RECTAL | Status: DC | PRN

## 2019-11-27 NOTE — Code Documentation (Addendum)
Stroke Response Nurse Documentation Code Documentation  Makayla Fisher is a 86 y.o. female arriving to [place] ED via [facility] on 8/4 with past medical hx of . Code stroke was activated by EMS . Patient from Skilled care facility where she was LKW at 2300 and now complaining of Confusion, right sided weakness . Stroke team at the bedside on patient arrival. Labs drawn and patient cleared for CT by Dr. Wyvonnia Dusky. Patient to CT with team. NIHSS 9, see documentation for details and code stroke times. Patient with disoriented, right arm weakness and right leg weakness on exam. The following imaging was completed:  CTA.TPA was given. Bedside handoff with ED RN Andria Frames.    Delay in starting TPA due to elevated BP.  Madelynn Done  Rapid Response RN

## 2019-11-27 NOTE — ED Provider Notes (Signed)
[facility]TH NEURO/TRAUMA/SURGICAL ICU Provider Note   CSN: [id number] Arrival date & time: 11/27/19  0155  An emergency department physician performed an initial assessment on this suspected stroke patient at 0156.  History Chief Complaint  Patient presents with  . Code Stroke    Makayla Fisher is a 86 y.o. female.  The history is provided by the EMS personnel. The history is limited by the condition of the patient.  Neurologic Problem This is a new problem. The current episode started 1 to 2 hours ago. The problem occurs constantly. The problem has not changed since onset.Pertinent negatives include no chest pain, no abdominal pain and no headaches. Nothing aggravates the symptoms. Nothing relieves the symptoms. She has tried nothing for the symptoms.       Past Medical History:  Diagnosis Date  . GERD (gastroesophageal reflux disease)   . Heart attack ([place]) 2009  . History of blood transfusion 2009  . Iron deficiency anemia due to chronic blood loss 08/07/2014  . NHL (non-Hodgkin's lymphoma) ([place]) 10/18/2013   finished chemo dec 2015, maintenanance retuxin    Patient Active Problem List   Diagnosis Date Noted  . 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 TIA (transient ischemic attack) 04/30/2017  . HLD (hyperlipidemia) 04/30/2017  . Hx of CABG x 3/stent 04/30/2017  . Bilateral carotid artery stenosis 04/30/2017  . Non Hodgkin's lymphoma ([place]) 04/30/2017  . Hypothyroidism 04/30/2017  . Hypertension 04/30/2017  . S/P colostomy ([place]) 04/30/2017  . Anxiety disorder 04/30/2017  . Abnormal urinalysis 04/30/2017  . CAP (community acquired pneumonia) 12/13/2016  . Acute lower UTI 12/13/2016  . Glaucoma 12/13/2016  . Colostomy in place [facility]) 12/13/2016  . Vitamin D deficiency 10/20/2016  . Follicular lymphoma grade IIIa of intra-abdominal lymph nodes ([place]) 04/22/2016  . Iron deficiency anemia due to chronic blood loss 08/07/2014  . HTN (hypertension) 07/15/2014  . Unilateral  carotid artery disease ([place]) 07/15/2014  . Bradycardia 07/15/2014  . Colon stricture ([place]) 06/13/2014  . 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 Preop cardiovascular exam 04/08/2014  . NHL (non-Hodgkin's lymphoma) ([place]) 10/18/2013  . 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 c section  1947, 1963  . CARDIAC SURGERY  2009  . CHOLECYSTECTOMY  1973  . COLON SURGERY  2014   Colostomy  . COLOSTOMY REVISION N/A 06/12/2014   Procedure: COLOSTOMY REVISION;  Surgeon: [name], MD;  Location: [facility];  Service: General;  Laterality: N/A;  . CORONARY ANGIOPLASTY WITH STENT PLACEMENT  2009  . CORONARY ARTERY BYPASS GRAFT  aug 2009    x3  . ESOPHAGOGASTRODUODENOSCOPY  2013  . EYE SURGERY Bilateral  10 years ago   lens replacments cataracts  . right chest pac  june 2015     OB History   No obstetric history on file.     Family History  Problem Relation Age of Onset  . Diverticulitis Mother   . Heart attack Mother   . Ulcers Father     Social History   Tobacco Use  . Smoking status: Former Smoker    Packs/day: 1.00    Years: 25.00    Pack years: 25.00    Types: Cigarettes    Start date: 05/16/1948    Quit date: 05/17/1963    Years since quitting: 56.5  . Smokeless tobacco: Never Used  . Tobacco comment: quit smoking 50 years ago  Vaping Use  . Vaping Use: Never used  Substance Use Topics  .  Alcohol use: No    Alcohol/week: 0.0 standard drinks  . Drug use: No    Home Medications Prior to Admission medications   Medication Sig Start Date End Date Taking? Authorizing Provider  aspirin EC 81 MG EC tablet Take 1 tablet (81 mg total) by mouth daily. 05/02/17  Yes Geradine Girt, DO  Calcium Carb-Cholecalciferol (CALCIUM 600+D3 PO) Take 1 tablet by mouth at bedtime.   Yes [provider]  Cholecalciferol (VITAMIN D3 SUPER STRENGTH) 50 MCG (2000 UT) CAPS Take 4,000 Units by mouth daily.    Yes [provider]  Cranberry 425 MG CAPS Take 425 mg by mouth daily.    Yes [provider]  levothyroxine (SYNTHROID, LEVOTHROID) 50 MCG tablet Take 50 mcg by mouth daily before breakfast.  10/08/15  Yes [provider]  lidocaine-prilocaine (EMLA) cream Apply 1 application topically See admin instructions. Apply to port-a-cath one hour prior to procedure 09/16/19  Yes [provider]  lisinopril (PRINIVIL,ZESTRIL) 20 MG tablet Take 1 tablet (20 mg total) by mouth daily. 05/10/18  Yes [name], MD  Multiple Vitamin (MULTIVITAMIN WITH MINERALS) TABS tablet Take 1 tablet by mouth daily at 12 noon.    Yes [provider]  NEOMYCIN-POLYMYXIN-HYDROCORTISONE (CORTISPORIN) 1 % SOLN OTIC solution Apply 1-2 drops to toe BID after soaking Patient taking differently: 1-2 drops See admin instructions. Apply 1-2 drops to affected toe 2 times a day after soaking 10/31/19  Yes Hyatt, Max T, DPM  nystatin cream (MYCOSTATIN) Apply 1 application topically every 12 (twelve) hours as needed (to rash between the legs and groin).   Yes [provider]  psyllium (METAMUCIL) 58.6 % packet Take 1 packet weekly Patient taking differently: Take 1 packet by mouth every Monday, Wednesday, and Friday. [place] 05/10/18  Yes [name], MD  psyllium (METAMUCIL) 58.6 % powder Take 1 packet by mouth daily as needed (for constipation).   Yes [provider]  rOPINIRole (REQUIP) 1 MG tablet Take 1 mg by mouth at bedtime.   Yes [provider]  sertraline (ZOLOFT) 25 MG tablet Take 12.5 mg by mouth daily.    Yes [provider]  SPIRIVA HANDIHALER 18 MCG inhalation capsule Place 18 mcg into inhaler and inhale daily.  07/23/15  Yes [provider]  SYSTANE COMPLETE 0.6 % SOLN Place 1 drop into both eyes 3 (three) times daily.  08/19/19  Yes [provider]  acetaminophen (TYLENOL 8 HOUR) 650 MG CR tablet Take 1 tablet (650 mg total) by mouth every 8 (eight)  hours as needed for pain. Patient not taking: Reported on 11/16/2019 05/31/17   Varney Biles, MD  Saccharomyces boulardii (PROBIOTIC) 250 MG CAPS Take 250 mg by mouth 2 (two) times daily. Patient not taking: Reported on 11/16/2019 05/31/17   Varney Biles, MD    Allergies    Epipen [epinephrine hcl (nasal)], Levaquin [levofloxacin in d5w], Chlorhexidine gluconate, Heparin, and Sulfa antibiotics  Review of Systems   Review of Systems  Unable to perform ROS: Mental status change  Cardiovascular: Negative for chest pain.  Gastrointestinal: Negative for abdominal pain.  Neurological: Negative for headaches.    Physical Exam Updated Vital Signs BP (!) 140/125   Pulse 71   Temp 98.1 F (36.7 C) (Axillary)   Resp 18   Wt 85 kg   SpO2 99%   BMI 34.27 kg/m   Physical Exam Vitals and nursing note reviewed.  Constitutional:      Appearance: She is  well-developed.  HENT:     Head: Normocephalic and atraumatic.     Mouth/Throat:     Mouth: Mucous membranes are moist.     Pharynx: Oropharynx is clear.  Eyes:     Conjunctiva/sclera: Conjunctivae normal.     Pupils: Pupils are equal, round, and reactive to light.  Cardiovascular:     Rate and Rhythm: Normal rate and regular rhythm.  Pulmonary:     Effort: No respiratory distress.     Breath sounds: No stridor.  Abdominal:     General: There is no distension.  Musculoskeletal:        General: No swelling or tenderness. Normal range of motion.     Cervical back: Normal range of motion.  Skin:    General: Skin is warm and dry.  Neurological:     Mental Status: She is alert.     Comments: Confused speech, right sided weakness     ED Results / Procedures / Treatments   Labs (all labs ordered are listed, but only abnormal results are displayed) Labs Reviewed  MRSA PCR SCREENING - Abnormal; Notable for the following components:      Result Value   MRSA by PCR POSITIVE (*)    All other components within normal limits  CBC -  Abnormal; Notable for the following components:   Platelets 148 (*)    All other components within normal limits  COMPREHENSIVE METABOLIC PANEL - Abnormal; Notable for the following components:   Glucose, Bld 150 (*)    Total Protein 6.3 (*)    GFR calc non Af Amer 52 (*)    All other components within normal limits  URINALYSIS, ROUTINE W REFLEX MICROSCOPIC - Abnormal; Notable for the following components:   Color, Urine STRAW (*)    Protein, ur 30 (*)    Leukocytes,Ua SMALL (*)    All other components within normal limits  I-STAT CHEM 8, ED - Abnormal; Notable for the following components:   Glucose, Bld 149 (*)    Calcium, Ion 1.13 (*)    All other components within normal limits  CBG MONITORING, ED - Abnormal; Notable for the following components:   Glucose-Capillary 141 (*)    All other components within normal limits  SARS CORONAVIRUS 2 BY RT PCR (HOSPITAL ORDER, [place] [facility])  ETHANOL  PROTIME-INR  APTT  DIFFERENTIAL  RAPID URINE DRUG SCREEN, HOSP PERFORMED  HEMOGLOBIN A1C  LIPID PANEL    EKG None  Radiology CT ANGIO NECK W OR WO CONTRAST  Result Date: 11/27/2019 CLINICAL DATA:  Aphasia and encephalopathy EXAM: CT ANGIOGRAPHY HEAD AND NECK TECHNIQUE: Multidetector CT imaging of the head and neck was performed using the standard protocol during bolus administration of intravenous contrast. Multiplanar CT image reconstructions and MIPs were obtained to evaluate the vascular anatomy. Carotid stenosis measurements (when applicable) are obtained utilizing NASCET criteria, using the distal internal carotid diameter as the denominator. CONTRAST:  31mL OMNIPAQUE IOHEXOL 350 MG/ML SOLN COMPARISON:  None. FINDINGS: CTA NECK FINDINGS SKELETON: There is no bony spinal canal stenosis. No lytic or blastic lesion. OTHER NECK: Normal pharynx, larynx and major salivary glands. No cervical lymphadenopathy. Unremarkable thyroid gland. UPPER CHEST: Emphysema AORTIC ARCH:  There is calcific atherosclerosis of the aortic arch. There is no aneurysm, dissection or hemodynamically significant stenosis of the visualized portion of the aorta. Conventional 3 vessel aortic branching pattern. The visualized proximal subclavian arteries are widely patent. RIGHT CAROTID SYSTEM: No dissection, occlusion or aneurysm. There  is calcified atherosclerosis extending into the proximal ICA, resulting in less than 50% stenosis. LEFT CAROTID SYSTEM: No dissection, occlusion or aneurysm. There is calcified atherosclerosis extending into the proximal ICA, resulting in less than 50% stenosis. VERTEBRAL ARTERIES: Codominant configuration. Both origins are clearly patent. There is no dissection, occlusion or flow-limiting stenosis to the skull base (V1-V3 segments). CTA HEAD FINDINGS POSTERIOR CIRCULATION: --Vertebral arteries: Normal V4 segments. --Inferior cerebellar arteries: Normal. --Basilar artery: Normal. --Superior cerebellar arteries: Normal. --Posterior cerebral arteries (PCA): Normal. ANTERIOR CIRCULATION: --Intracranial internal carotid arteries: Normal. --Anterior cerebral arteries (ACA): Normal. Both A1 segments are present. Patent anterior communicating artery (a-comm). --Middle cerebral arteries (MCA): Normal. VENOUS SINUSES: As permitted by contrast timing, patent. ANATOMIC VARIANTS: None Review of the MIP images confirms the above findings. IMPRESSION: 1. No emergent large vessel occlusion or high-grade stenosis of the intracranial arteries. 2. Bilateral carotid bifurcation atherosclerosis with less than 50% stenosis by NASCET criteria. 3. Aortic Atherosclerosis (ICD10-I70.0) and Emphysema (ICD10-J43.9). Electronically Signed   By: Ulyses Jarred M.D.   On: 11/27/2019 02:49   CT HEAD CODE STROKE WO CONTRAST  Result Date: 11/27/2019 CLINICAL DATA:  Code stroke.  Aphasia and altered mental status EXAM: CT HEAD WITHOUT CONTRAST TECHNIQUE: Contiguous axial images were obtained from the base of  the skull through the vertex without intravenous contrast. COMPARISON:  None. FINDINGS: Brain: There is no mass, hemorrhage or extra-axial collection. There is generalized atrophy without lobar predilection. There is hypoattenuation of the periventricular white matter, most commonly indicating chronic ischemic microangiopathy. Vascular: No abnormal hyperdensity of the major intracranial arteries or dural venous sinuses. No intracranial atherosclerosis. Skull: The visualized skull base, calvarium and extracranial soft tissues are normal. Sinuses/Orbits: No fluid levels or advanced mucosal thickening of the visualized paranasal sinuses. No mastoid or middle ear effusion. The orbits are normal. ASPECTS [facility] Stroke Program Early CT Score) - Ganglionic level infarction (caudate, lentiform nuclei, internal capsule, insula, M1-M3 cortex): 7 - Supraganglionic infarction (M4-M6 cortex): 3 Total score (0-10 with 10 being normal): 10 IMPRESSION: 1. No acute intracranial abnormality. 2. ASPECTS is 10. These results were communicated to Dr. Karena Addison Aroor at 2:16 am on 11/27/2019 by text page via the [facility] messaging system. Electronically Signed   By: Ulyses Jarred M.D.   On: 11/27/2019 02:16   CT ANGIO HEAD CODE STROKE  Result Date: 11/27/2019 CLINICAL DATA:  Aphasia and encephalopathy EXAM: CT ANGIOGRAPHY HEAD AND NECK TECHNIQUE: Multidetector CT imaging of the head and neck was performed using the standard protocol during bolus administration of intravenous contrast. Multiplanar CT image reconstructions and MIPs were obtained to evaluate the vascular anatomy. Carotid stenosis measurements (when applicable) are obtained utilizing NASCET criteria, using the distal internal carotid diameter as the denominator. CONTRAST:  47mL OMNIPAQUE IOHEXOL 350 MG/ML SOLN COMPARISON:  None. FINDINGS: CTA NECK FINDINGS SKELETON: There is no bony spinal canal stenosis. No lytic or blastic lesion. OTHER NECK: Normal pharynx, larynx and  major salivary glands. No cervical lymphadenopathy. Unremarkable thyroid gland. UPPER CHEST: Emphysema AORTIC ARCH: There is calcific atherosclerosis of the aortic arch. There is no aneurysm, dissection or hemodynamically significant stenosis of the visualized portion of the aorta. Conventional 3 vessel aortic branching pattern. The visualized proximal subclavian arteries are widely patent. RIGHT CAROTID SYSTEM: No dissection, occlusion or aneurysm. There is calcified atherosclerosis extending into the proximal ICA, resulting in less than 50% stenosis. LEFT CAROTID SYSTEM: No dissection, occlusion or aneurysm. There is calcified atherosclerosis extending into the proximal ICA, resulting in less than 50%  stenosis. VERTEBRAL ARTERIES: Codominant configuration. Both origins are clearly patent. There is no dissection, occlusion or flow-limiting stenosis to the skull base (V1-V3 segments). CTA HEAD FINDINGS POSTERIOR CIRCULATION: --Vertebral arteries: Normal V4 segments. --Inferior cerebellar arteries: Normal. --Basilar artery: Normal. --Superior cerebellar arteries: Normal. --Posterior cerebral arteries (PCA): Normal. ANTERIOR CIRCULATION: --Intracranial internal carotid arteries: Normal. --Anterior cerebral arteries (ACA): Normal. Both A1 segments are present. Patent anterior communicating artery (a-comm). --Middle cerebral arteries (MCA): Normal. VENOUS SINUSES: As permitted by contrast timing, patent. ANATOMIC VARIANTS: None Review of the MIP images confirms the above findings. IMPRESSION: 1. No emergent large vessel occlusion or high-grade stenosis of the intracranial arteries. 2. Bilateral carotid bifurcation atherosclerosis with less than 50% stenosis by NASCET criteria. 3. Aortic Atherosclerosis (ICD10-I70.0) and Emphysema (ICD10-J43.9). Electronically Signed   By: Ulyses Jarred M.D.   On: 11/27/2019 02:49    Procedures .Critical Care Performed by: Merrily Pew, MD Authorized by: Merrily Pew, MD    Critical care provider statement:    Critical care time (minutes):  45   Critical care was necessary to treat or prevent imminent or life-threatening deterioration of the following conditions:  CNS failure or compromise   Critical care was time spent personally by me on the following activities:  Discussions with consultants, evaluation of patient's response to treatment, examination of patient, ordering and performing treatments and interventions, ordering and review of laboratory studies, ordering and review of radiographic studies, pulse oximetry, re-evaluation of patient's condition, obtaining history from patient or surrogate and review of old charts   (including critical care time)  Medications Ordered in ED Medications  0.9 %  sodium chloride infusion ( Intravenous Rate/Dose Verify 11/27/19 0530)  acetaminophen (TYLENOL) tablet 650 mg (has no administration in time range)    Or  acetaminophen (TYLENOL) 160 MG/5ML solution 650 mg (has no administration in time range)    Or  acetaminophen (TYLENOL) suppository 650 mg (has no administration in time range)  senna-docusate (Senokot-S) tablet 1 tablet (has no administration in time range)  clevidipine (CLEVIPREX) infusion 0.5 mg/mL (6 mg/hr Intravenous Rate/Dose Verify 11/27/19 0530)  labetalol (NORMODYNE) 5 MG/ML injection (has no administration in time range)  labetalol (NORMODYNE) injection 10 mg (has no administration in time range)  mupirocin ointment (BACTROBAN) 2 % 1 application (has no administration in time range)  alteplase (ACTIVASE) 1 mg/mL infusion 76.5 mg (0 mg/kg  85 kg Intravenous Stopped 11/27/19 0331)    Followed by  0.9 %  sodium chloride infusion (0 mLs Intravenous Stopped 11/27/19 0355)   stroke: mapping our early stages of recovery book ( Does not apply Given 11/27/19 0602)  iohexol (OMNIPAQUE) 350 MG/ML injection 80 mL (80 mLs Intravenous Contrast Given 11/27/19 0221)  labetalol (NORMODYNE) injection (20 mg Intravenous Given  11/27/19 0222)  clevidipine (CLEVIPREX) infusion 0.5 mg/mL ( Intravenous Stopped 11/27/19 0331)  labetalol (NORMODYNE) injection 10 mg (10 mg Intravenous Given 11/27/19 0513)    ED Course  I have reviewed the triage vital signs and the nursing notes.  Pertinent labs & imaging results that were available during my care of the patient were reviewed by me and considered in my medical decision making (see chart for details).    MDM Rules/Calculators/A&P                          Here for acute ischemic stroke. LKW around 2300, tpA given. No significant complications noted. BP meds started first. To be admitted to neuro icu.  Final  Clinical Impression(s) / ED Diagnoses Final diagnoses:  Acute ischemic stroke [facility])    Rx / DC Orders ED Discharge Orders    None       Markee Matera, Corene Cornea, MD 11/28/19 [phone]

## 2019-11-27 NOTE — Evaluation (Signed)
Speech Language Pathology Evaluation Patient Details Name: Makayla Fisher MRN: [id number] DOB: 06/04/1922 Today's Date: 11/27/2019 Time: 2505-3976 SLP Time Calculation (min) (ACUTE ONLY): 23 min  Problem List:  Patient Active Problem List   Diagnosis Date Noted  . 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 TIA (transient ischemic attack) 04/30/2017  . HLD (hyperlipidemia) 04/30/2017  . Hx of CABG x 3/stent 04/30/2017  . Bilateral carotid artery stenosis 04/30/2017  . Non Hodgkin's lymphoma ([place]) 04/30/2017  . Hypothyroidism 04/30/2017  . Hypertension 04/30/2017  . S/P colostomy ([place]) 04/30/2017  . Anxiety disorder 04/30/2017  . Abnormal urinalysis 04/30/2017  . CAP (community acquired pneumonia) 12/13/2016  . Acute lower UTI 12/13/2016  . Glaucoma 12/13/2016  . Colostomy in place [facility]) 12/13/2016  . Vitamin D deficiency 10/20/2016  . Follicular lymphoma grade IIIa of intra-abdominal lymph nodes ([place]) 04/22/2016  . Iron deficiency anemia due to chronic blood loss 08/07/2014  . HTN (hypertension) 07/15/2014  . Unilateral carotid artery disease ([place]) 07/15/2014  . Bradycardia 07/15/2014  . Colon stricture ([place]) 06/13/2014  . 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 Preop cardiovascular exam 04/08/2014  . NHL (non-Hodgkin's lymphoma) ([place]) 10/18/2013  . 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 GERD (gastroesophageal reflux disease)   . Heart attack ([place]) 2009  . History of blood transfusion 2009  . Iron deficiency anemia due to chronic blood loss 08/07/2014  . NHL (non-Hodgkin's lymphoma) ([place]) 10/18/2013   finished chemo dec 2015, maintenanance retuxin   Past Surgical History:  Past Surgical History:  Procedure Laterality Date  . c section  1947, 1963  . CARDIAC SURGERY  2009  . CHOLECYSTECTOMY  1973  . COLON SURGERY  2014   Colostomy  . COLOSTOMY REVISION N/A 06/12/2014   Procedure: COLOSTOMY REVISION;   Surgeon: [name], MD;  Location: [facility]S;  Service: General;  Laterality: N/A;  . CORONARY ANGIOPLASTY WITH STENT PLACEMENT  2009  . CORONARY ARTERY BYPASS GRAFT  aug 2009    x3  . ESOPHAGOGASTRODUODENOSCOPY  2013  . EYE SURGERY Bilateral  10 years ago   lens replacments cataracts  . right chest pac  june 2015   HPI:  Pt is a 86 yo female presenting from ALF with AMS, R-sided weakness, and abnormal speech. She received tPA 8/4. CT head without acute changes; MRI pending. PMH includes: non-Hodgkin's lymphoma, MI, iron deficiency anemia, GERD   Assessment / Plan / Recommendation Clinical Impression  Pt is hard of hearing with hearing aids in place, so the majority of this evaluation took place utilizing written communication. With her glasses in place she still seemed to have trouble reading, although at times this appeared to be impacted by her ability to track the words on the page. SLP provided visual cues to attend from the left to the right side of the page. She followed written one-step commands well, but had difficulty consistently comprehending two-step commands. Pt had difficulty comprehending instructions for simple verbal sequencing task, but was 80% accurate with confrontational naming. Word finding errors were noted in spontaneous communication, with pt often starting a sentence but not able to finish it. She shows awareness of these linguistic errors, saying that she can't think of what she wants to say. I think pt would do best if able to return to her familiar environment (ALF) with SLP follow up post-discharge.     SLP Assessment  SLP Recommendation/Assessment: Patient needs continued [place] [facility] SLP  Visit Diagnosis: Aphasia (R47.01)    Follow Up Recommendations  Home health SLP;24 hour supervision/assistance (return to ALF if able)   Frequency and Duration min 2x/week  2 weeks      SLP Evaluation Cognition  Overall Cognitive Status: Difficult  to assess Arousal/Alertness: Awake/alert Orientation Level: Oriented to person;Oriented to place;Disoriented to situation Behaviors: Perseveration       Comprehension  Auditory Comprehension Overall Auditory Comprehension: Impaired Commands: Impaired One Step Basic Commands: 75-100% accurate Two Step Basic Commands: 50-74% accurate Interfering Components: Visual impairments;Hearing Reading Comprehension Reading Status: Impaired Sentence Level: Impaired Interfering Components: Attention;Visual scanning;Visual acuity    Expression Expression Primary Mode of Expression: Verbal Verbal Expression Overall Verbal Expression: Impaired Initiation: No impairment Level of Generative/Spontaneous Verbalization: Sentence Naming: Impairment Confrontation: Impaired Verbal Errors: Aware of errors Non-Verbal Means of Communication: Not applicable   Oral / Motor  Motor Speech Overall Motor Speech: Appears within functional limits for tasks assessed   GO                    Osie Bond., M.A. [place] [facility] Pager [phone] Office [phone]  11/27/2019, 12:59 PM

## 2019-11-27 NOTE — Progress Notes (Signed)
  Echocardiogram 2D Echocardiogram has been performed.  Makayla Fisher 11/27/2019, 3:42 PM

## 2019-11-27 NOTE — Consult Note (Signed)
[place] Nurse ostomy consult note Stoma type/location: LMQ colostomy.  Managed by staff at SNF.  Current coloplast 1 piece pouch is in place with minimal stool in place.  I have placed an additional 1 piece Hollister pouch in the room for next change.  Stomal assessment/size: not assessed Peristomal assessment: not assessed Treatment options for stomal/peristomal skin: 1 piece pouch Output soft brown stool Ostomy pouching: 1pc.convex pouch left in room for next pouch change.  Education provided: Emotional support to patient. SHe is very uncomfortable and cold.  Repositioned in bed and blankets are placed snugly around her.  Her mouth is dry and I provide mouth care with oral swabs.  She states that is much better.  Family at bedside.  Enrolled patient in [facility] program: No Will not follow at this time.  Please re-consult if needed.  Domenic Moras MSN, RN, FNP-BC CWON Wound, Ostomy, Continence Nurse Pager [phone]

## 2019-11-27 NOTE — Progress Notes (Signed)
Pharmacist Code Stroke Response  Notified to mix tPA at 0213 by Dr. Lorraine Lax Delivered tPA to RN at 0216  tPA dose = 7.7mg  bolus over 1 minute followed by 68.8mg  for a total dose of 76.5mg  over 1 hour  Issues/delays encountered (if applicable): Delay in starting tPA due to elevated systolic BP  Narda Bonds, PharmD, BCPS Clinical Pharmacist Phone: [phone]

## 2019-11-27 NOTE — H&P (Signed)
Chief Complaint: Aphasia  History obtained from: EMS and Chart   HPI:                                                                                                                                       Makayla Fisher is a 86 y.o. female with past medical history significant for non-Hodgkin's lymphoma, MI, iron deficiency anemia presents to the emergency department as a code stroke for sudden onset altered mental status, right-sided weakness and abnormal speech.  Last known normal was 11 PM witnessed by nursing staff in her assisted living facility.  When they went to check on her again at 1 AM patient was not speaking and EMS was alerted on assessment patient was weaker on the right compared to the left, and would repeat she is scared. Blood pressure was 324 systolic.  On arrival to [facility], ER-initial NIH stroke scale was 9. Stat CT head was obtained which showed no hemorrhage and patient received IV TPA after discussion with patient's niece.  CTA was negative for large vessel occlusion.  Slight delay for TPA was due to blood pressure control requiring clevidipine.  Date last known well: 8.3.21 Time last known well: 11 pm tPA Given: yes,  NIHSS: 9 Baseline MRS 3   Past Medical History:  Diagnosis Date  . GERD (gastroesophageal reflux disease)   . Heart attack ([place]) 2009  . History of blood transfusion 2009  . Iron deficiency anemia due to chronic blood loss 08/07/2014  . NHL (non-Hodgkin's lymphoma) ([place]) 10/18/2013   finished chemo dec 2015, maintenanance retuxin    Past Surgical History:  Procedure Laterality Date  . c section  1947, 1963  . CARDIAC SURGERY  2009  . CHOLECYSTECTOMY  1973  . COLON SURGERY  2014   Colostomy  . COLOSTOMY REVISION N/A 06/12/2014   Procedure: COLOSTOMY REVISION;  Surgeon: [name], MD;  Location: [facility] ORS;  Service: General;  Laterality: N/A;  . CORONARY ANGIOPLASTY WITH STENT PLACEMENT  2009  . CORONARY ARTERY BYPASS GRAFT  aug 2009    x3   . ESOPHAGOGASTRODUODENOSCOPY  2013  . EYE SURGERY Bilateral  10 years ago   lens replacments cataracts  . right chest pac  june 2015    Family History  Problem Relation Age of Onset  . Diverticulitis Mother   . Heart attack Mother   . Ulcers Father    Social History:  reports that she quit smoking about 56 years ago. Her smoking use included cigarettes. She started smoking about 71 years ago. She has a 25.00 pack-year smoking history. She has never used smokeless tobacco. She reports that she does not drink alcohol and does not use drugs.  Allergies:  Allergies  Allergen Reactions  . Epipen [Epinephrine Hcl (Nasal)] Palpitations  . Levaquin [Levofloxacin In D5w] Other (See Comments)    Weakness- "Allergic," per MAR  . Chlorhexidine Gluconate Other (See  Comments)    Pt declined use and suspects she may have an intolerance  . Heparin Other (See Comments)    "Alleregic," pre MAR- Maybe affected platelets, per patient  . Sulfa Antibiotics Rash    Medications:                                                                                                                        I reviewed home medications   ROS:                                                                                                                                     Unable to review systems due to aphasia    Examination:                                                                                                      General: Appears well-developed  Psych: Affect appropriate to situation Eyes: No scleral injection HENT: No OP obstrucion Head: Normocephalic.  Cardiovascular: Normal rate and regular rhythm.  Respiratory: Effort normal and breath sounds normal to anterior ascultation GI: Soft.  No distension. There is no tenderness.  Skin: WDI    Neurological Examination Mental Status: Alert, not answering question appropriately and not following commands. Not following commands. Cranial  Nerves: II: Visual fields grossly normal,  III,IV, VI: ptosis not present, extra-ocular motions intact bilaterally, pupils equal, round, reactive to light and accommodation V,VII: smile symmetric, facial light touch sensation normal bilaterally VIII: hearing normal bilaterally IX,X: uvula rises symmetrically XI: bilateral shoulder shrug XII: midline tongue extension Motor: Right : Upper extremity   3/5    Left:     Upper extremity   5/5  Lower extremity   3/5     Lower extremity   5/5 Tone and bulk:normal tone throughout; no atrophy noted Sensory: withdraws bilaterally to noxious stimulust Plantars: Right: downgoing   Left: downgoing Cerebellar: No gross ataxia      Lab Results: Basic Metabolic Panel:  Recent Labs  Lab 11/21/19 1340 11/27/19 0203 11/27/19 0208  NA 139 138 138  K 3.6 3.7 3.5  CL 102 102 101  CO2 25 26  --   GLUCOSE 100* 150* 149*  BUN 15 13 14   CREATININE 0.83 0.94 0.90  CALCIUM 9.2 10.1  --     CBC: Recent Labs  Lab 11/21/19 1340 11/27/19 0203 11/27/19 0208  WBC 7.2 8.6  --   NEUTROABS 4.4 4.6  --   HGB 12.7 13.7 15.0  HCT 39.8 43.3 44.0  MCV 95.9 96.0  --   PLT 155 148*  --     Coagulation Studies: Recent Labs    11/27/19 0203  LABPROT 13.3  INR 1.1    Imaging: CT ANGIO NECK W OR WO CONTRAST  Result Date: 11/27/2019 CLINICAL DATA:  Aphasia and encephalopathy EXAM: CT ANGIOGRAPHY HEAD AND NECK TECHNIQUE: Multidetector CT imaging of the head and neck was performed using the standard protocol during bolus administration of intravenous contrast. Multiplanar CT image reconstructions and MIPs were obtained to evaluate the vascular anatomy. Carotid stenosis measurements (when applicable) are obtained utilizing NASCET criteria, using the distal internal carotid diameter as the denominator. CONTRAST:  70mL OMNIPAQUE IOHEXOL 350 MG/ML SOLN COMPARISON:  None. FINDINGS: CTA NECK FINDINGS SKELETON: There is no bony spinal canal stenosis. No lytic or  blastic lesion. OTHER NECK: Normal pharynx, larynx and major salivary glands. No cervical lymphadenopathy. Unremarkable thyroid gland. UPPER CHEST: Emphysema AORTIC ARCH: There is calcific atherosclerosis of the aortic arch. There is no aneurysm, dissection or hemodynamically significant stenosis of the visualized portion of the aorta. Conventional 3 vessel aortic branching pattern. The visualized proximal subclavian arteries are widely patent. RIGHT CAROTID SYSTEM: No dissection, occlusion or aneurysm. There is calcified atherosclerosis extending into the proximal ICA, resulting in less than 50% stenosis. LEFT CAROTID SYSTEM: No dissection, occlusion or aneurysm. There is calcified atherosclerosis extending into the proximal ICA, resulting in less than 50% stenosis. VERTEBRAL ARTERIES: Codominant configuration. Both origins are clearly patent. There is no dissection, occlusion or flow-limiting stenosis to the skull base (V1-V3 segments). CTA HEAD FINDINGS POSTERIOR CIRCULATION: --Vertebral arteries: Normal V4 segments. --Inferior cerebellar arteries: Normal. --Basilar artery: Normal. --Superior cerebellar arteries: Normal. --Posterior cerebral arteries (PCA): Normal. ANTERIOR CIRCULATION: --Intracranial internal carotid arteries: Normal. --Anterior cerebral arteries (ACA): Normal. Both A1 segments are present. Patent anterior communicating artery (a-comm). --Middle cerebral arteries (MCA): Normal. VENOUS SINUSES: As permitted by contrast timing, patent. ANATOMIC VARIANTS: None Review of the MIP images confirms the above findings. IMPRESSION: 1. No emergent large vessel occlusion or high-grade stenosis of the intracranial arteries. 2. Bilateral carotid bifurcation atherosclerosis with less than 50% stenosis by NASCET criteria. 3. Aortic Atherosclerosis (ICD10-I70.0) and Emphysema (ICD10-J43.9). Electronically Signed   By: Ulyses Jarred M.D.   On: 11/27/2019 02:49   CT HEAD CODE STROKE WO CONTRAST  Result Date:  11/27/2019 CLINICAL DATA:  Code stroke.  Aphasia and altered mental status EXAM: CT HEAD WITHOUT CONTRAST TECHNIQUE: Contiguous axial images were obtained from the base of the skull through the vertex without intravenous contrast. COMPARISON:  None. FINDINGS: Brain: There is no mass, hemorrhage or extra-axial collection. There is generalized atrophy without lobar predilection. There is hypoattenuation of the periventricular white matter, most commonly indicating chronic ischemic microangiopathy. Vascular: No abnormal hyperdensity of the major intracranial arteries or dural venous sinuses. No intracranial atherosclerosis. Skull: The visualized skull base, calvarium and extracranial soft tissues are normal. Sinuses/Orbits: No fluid levels or advanced mucosal thickening of the  visualized paranasal sinuses. No mastoid or middle ear effusion. The orbits are normal. ASPECTS [facility] Stroke Program Early CT Score) - Ganglionic level infarction (caudate, lentiform nuclei, internal capsule, insula, M1-M3 cortex): 7 - Supraganglionic infarction (M4-M6 cortex): 3 Total score (0-10 with 10 being normal): 10 IMPRESSION: 1. No acute intracranial abnormality. 2. ASPECTS is 10. These results were communicated to Dr. Karena Addison Ashlynne Shetterly at 2:16 am on 11/27/2019 by text page via the [facility] messaging system. Electronically Signed   By: Ulyses Jarred M.D.   On: 11/27/2019 02:16   CT ANGIO HEAD CODE STROKE  Result Date: 11/27/2019 CLINICAL DATA:  Aphasia and encephalopathy EXAM: CT ANGIOGRAPHY HEAD AND NECK TECHNIQUE: Multidetector CT imaging of the head and neck was performed using the standard protocol during bolus administration of intravenous contrast. Multiplanar CT image reconstructions and MIPs were obtained to evaluate the vascular anatomy. Carotid stenosis measurements (when applicable) are obtained utilizing NASCET criteria, using the distal internal carotid diameter as the denominator. CONTRAST:  39mL OMNIPAQUE IOHEXOL 350 MG/ML  SOLN COMPARISON:  None. FINDINGS: CTA NECK FINDINGS SKELETON: There is no bony spinal canal stenosis. No lytic or blastic lesion. OTHER NECK: Normal pharynx, larynx and major salivary glands. No cervical lymphadenopathy. Unremarkable thyroid gland. UPPER CHEST: Emphysema AORTIC ARCH: There is calcific atherosclerosis of the aortic arch. There is no aneurysm, dissection or hemodynamically significant stenosis of the visualized portion of the aorta. Conventional 3 vessel aortic branching pattern. The visualized proximal subclavian arteries are widely patent. RIGHT CAROTID SYSTEM: No dissection, occlusion or aneurysm. There is calcified atherosclerosis extending into the proximal ICA, resulting in less than 50% stenosis. LEFT CAROTID SYSTEM: No dissection, occlusion or aneurysm. There is calcified atherosclerosis extending into the proximal ICA, resulting in less than 50% stenosis. VERTEBRAL ARTERIES: Codominant configuration. Both origins are clearly patent. There is no dissection, occlusion or flow-limiting stenosis to the skull base (V1-V3 segments). CTA HEAD FINDINGS POSTERIOR CIRCULATION: --Vertebral arteries: Normal V4 segments. --Inferior cerebellar arteries: Normal. --Basilar artery: Normal. --Superior cerebellar arteries: Normal. --Posterior cerebral arteries (PCA): Normal. ANTERIOR CIRCULATION: --Intracranial internal carotid arteries: Normal. --Anterior cerebral arteries (ACA): Normal. Both A1 segments are present. Patent anterior communicating artery (a-comm). --Middle cerebral arteries (MCA): Normal. VENOUS SINUSES: As permitted by contrast timing, patent. ANATOMIC VARIANTS: None Review of the MIP images confirms the above findings. IMPRESSION: 1. No emergent large vessel occlusion or high-grade stenosis of the intracranial arteries. 2. Bilateral carotid bifurcation atherosclerosis with less than 50% stenosis by NASCET criteria. 3. Aortic Atherosclerosis (ICD10-I70.0) and Emphysema (ICD10-J43.9).  Electronically Signed   By: Ulyses Jarred M.D.   On: 11/27/2019 02:49     ASSESSMENT AND PLAN   86 y.o. female with past medical history significant for non-Hodgkin's lymphoma, MI, iron deficiency anemia presents to the emergency department as a code stroke for sudden onset altered mental status, right-sided weakness and abnormal speech. Suspect acute stroke due to aphasia and right-sided weakness, however could also be seizure with postictal Todd's, acute metabolic encephalopathy.  Received IV TPA after discussion of risk versus benefit with daughter.  CTA negative for large vessel occlusion.  Patient admitted to neuro ICU for further monitoring and management.  Suspected acute ischemic stroke status post IV TPA Aphasia Hypertensive emergency  # MRI of the brain without contrast #Transthoracic Echo  #No antiplatelet therapy for 24 hours #Start or continue Atorvastatin 40 mg/other high intensity statin # BP goal: permissive HTN upto 180/105 mmHg, Cleviprex gtt # HBAIC and Lipid profile # Telemetry monitoring # Frequent neuro checks #  stroke swallow screen  Please page stroke NP  Or  PA  Or MD from 8am -4 pm  as this patient from this time will be  followed by the stroke.   You can look them up on [email or web address]  Password [facility]      Coady Train [facility] Pager Number [phone]

## 2019-11-27 NOTE — Progress Notes (Signed)
STROKE TEAM PROGRESS NOTE   INTERVAL HISTORY I personally reviewed history of presenting illness, electronic medical records and imaging films in PACS.  She presented with speech difficulties and right hemiparesis and received IV TPA and has made near complete recovery.  She is extremely hard of hearing.  CT scan of the head showed no acute abnormalities.  MRI is pending.  Urine drug screen and UA are negative.  LDL cholesterol is elevated 136 mg percent.  Hemoglobin A1c is 5.4.  Blood pressure is adequately controlled.     Vitals:   11/27/19 0700 11/27/19 0730 11/27/19 0800 11/27/19 0830  BP: (!) 147/74 (!) 148/92 (!) 169/59 (!) 180/60  Pulse: 72 76 70 69  Resp: (!) 21 18 17 20   Temp:      TempSrc:      SpO2: 97%  100%   Weight:       CBC:  Recent Labs  Lab 11/21/19 1340 11/21/19 1340 11/27/19 0203 11/27/19 0208  WBC 7.2  --  8.6  --   NEUTROABS 4.4  --  4.6  --   HGB 12.7  --  13.7 15.0  HCT 39.8   < > 43.3 44.0  MCV 95.9  --  96.0  --   PLT 155  --  148*  --    < > = values in this interval not displayed.   Basic Metabolic Panel:  Recent Labs  Lab 11/21/19 1340 11/21/19 1340 11/27/19 0203 11/27/19 0208  NA 139   < > 138 138  K 3.6   < > 3.7 3.5  CL 102   < > 102 101  CO2 25  --  26  --   GLUCOSE 100*   < > 150* 149*  BUN 15   < > 13 14  CREATININE 0.83   < > 0.94 0.90  CALCIUM 9.2  --  10.1  --    < > = values in this interval not displayed.   Lipid Panel:  Recent Labs  Lab 11/27/19 0547  CHOL 213*  TRIG 97  HDL 58  CHOLHDL 3.7  VLDL 19  LDLCALC 136*   HgbA1c:  Recent Labs  Lab 11/27/19 0547  HGBA1C 5.4   Urine Drug Screen:  Recent Labs  Lab 11/27/19 0421  LABOPIA NONE DETECTED  COCAINSCRNUR NONE DETECTED  LABBENZ NONE DETECTED  AMPHETMU NONE DETECTED  THCU NONE DETECTED  LABBARB NONE DETECTED    Alcohol Level  Recent Labs  Lab 11/27/19 0203  ETH <10    IMAGING past 24 hours CT ANGIO NECK W OR WO CONTRAST  Result Date:  11/27/2019 CLINICAL DATA:  Aphasia and encephalopathy EXAM: CT ANGIOGRAPHY HEAD AND NECK TECHNIQUE: Multidetector CT imaging of the head and neck was performed using the standard protocol during bolus administration of intravenous contrast. Multiplanar CT image reconstructions and MIPs were obtained to evaluate the vascular anatomy. Carotid stenosis measurements (when applicable) are obtained utilizing NASCET criteria, using the distal internal carotid diameter as the denominator. CONTRAST:  83mL OMNIPAQUE IOHEXOL 350 MG/ML SOLN COMPARISON:  None. FINDINGS: CTA NECK FINDINGS SKELETON: There is no bony spinal canal stenosis. No lytic or blastic lesion. OTHER NECK: Normal pharynx, larynx and major salivary glands. No cervical lymphadenopathy. Unremarkable thyroid gland. UPPER CHEST: Emphysema AORTIC ARCH: There is calcific atherosclerosis of the aortic arch. There is no aneurysm, dissection or hemodynamically significant stenosis of the visualized portion of the aorta. Conventional 3 vessel aortic branching pattern. The visualized proximal subclavian arteries are  widely patent. RIGHT CAROTID SYSTEM: No dissection, occlusion or aneurysm. There is calcified atherosclerosis extending into the proximal ICA, resulting in less than 50% stenosis. LEFT CAROTID SYSTEM: No dissection, occlusion or aneurysm. There is calcified atherosclerosis extending into the proximal ICA, resulting in less than 50% stenosis. VERTEBRAL ARTERIES: Codominant configuration. Both origins are clearly patent. There is no dissection, occlusion or flow-limiting stenosis to the skull base (V1-V3 segments). CTA HEAD FINDINGS POSTERIOR CIRCULATION: --Vertebral arteries: Normal V4 segments. --Inferior cerebellar arteries: Normal. --Basilar artery: Normal. --Superior cerebellar arteries: Normal. --Posterior cerebral arteries (PCA): Normal. ANTERIOR CIRCULATION: --Intracranial internal carotid arteries: Normal. --Anterior cerebral arteries (ACA): Normal.  Both A1 segments are present. Patent anterior communicating artery (a-comm). --Middle cerebral arteries (MCA): Normal. VENOUS SINUSES: As permitted by contrast timing, patent. ANATOMIC VARIANTS: None Review of the MIP images confirms the above findings. IMPRESSION: 1. No emergent large vessel occlusion or high-grade stenosis of the intracranial arteries. 2. Bilateral carotid bifurcation atherosclerosis with less than 50% stenosis by NASCET criteria. 3. Aortic Atherosclerosis (ICD10-I70.0) and Emphysema (ICD10-J43.9). Electronically Signed   By: Ulyses Jarred M.D.   On: 11/27/2019 02:49   CT HEAD CODE STROKE WO CONTRAST  Result Date: 11/27/2019 CLINICAL DATA:  Code stroke.  Aphasia and altered mental status EXAM: CT HEAD WITHOUT CONTRAST TECHNIQUE: Contiguous axial images were obtained from the base of the skull through the vertex without intravenous contrast. COMPARISON:  None. FINDINGS: Brain: There is no mass, hemorrhage or extra-axial collection. There is generalized atrophy without lobar predilection. There is hypoattenuation of the periventricular white matter, most commonly indicating chronic ischemic microangiopathy. Vascular: No abnormal hyperdensity of the major intracranial arteries or dural venous sinuses. No intracranial atherosclerosis. Skull: The visualized skull base, calvarium and extracranial soft tissues are normal. Sinuses/Orbits: No fluid levels or advanced mucosal thickening of the visualized paranasal sinuses. No mastoid or middle ear effusion. The orbits are normal. ASPECTS [facility] Stroke Program Early CT Score) - Ganglionic level infarction (caudate, lentiform nuclei, internal capsule, insula, M1-M3 cortex): 7 - Supraganglionic infarction (M4-M6 cortex): 3 Total score (0-10 with 10 being normal): 10 IMPRESSION: 1. No acute intracranial abnormality. 2. ASPECTS is 10. These results were communicated to Dr. Karena Addison Aroor at 2:16 am on 11/27/2019 by text page via the [facility] messaging system.  Electronically Signed   By: Ulyses Jarred M.D.   On: 11/27/2019 02:16   CT ANGIO HEAD CODE STROKE  Result Date: 11/27/2019 CLINICAL DATA:  Aphasia and encephalopathy EXAM: CT ANGIOGRAPHY HEAD AND NECK TECHNIQUE: Multidetector CT imaging of the head and neck was performed using the standard protocol during bolus administration of intravenous contrast. Multiplanar CT image reconstructions and MIPs were obtained to evaluate the vascular anatomy. Carotid stenosis measurements (when applicable) are obtained utilizing NASCET criteria, using the distal internal carotid diameter as the denominator. CONTRAST:  64mL OMNIPAQUE IOHEXOL 350 MG/ML SOLN COMPARISON:  None. FINDINGS: CTA NECK FINDINGS SKELETON: There is no bony spinal canal stenosis. No lytic or blastic lesion. OTHER NECK: Normal pharynx, larynx and major salivary glands. No cervical lymphadenopathy. Unremarkable thyroid gland. UPPER CHEST: Emphysema AORTIC ARCH: There is calcific atherosclerosis of the aortic arch. There is no aneurysm, dissection or hemodynamically significant stenosis of the visualized portion of the aorta. Conventional 3 vessel aortic branching pattern. The visualized proximal subclavian arteries are widely patent. RIGHT CAROTID SYSTEM: No dissection, occlusion or aneurysm. There is calcified atherosclerosis extending into the proximal ICA, resulting in less than 50% stenosis. LEFT CAROTID SYSTEM: No dissection, occlusion or aneurysm. There is calcified  atherosclerosis extending into the proximal ICA, resulting in less than 50% stenosis. VERTEBRAL ARTERIES: Codominant configuration. Both origins are clearly patent. There is no dissection, occlusion or flow-limiting stenosis to the skull base (V1-V3 segments). CTA HEAD FINDINGS POSTERIOR CIRCULATION: --Vertebral arteries: Normal V4 segments. --Inferior cerebellar arteries: Normal. --Basilar artery: Normal. --Superior cerebellar arteries: Normal. --Posterior cerebral arteries (PCA): Normal.  ANTERIOR CIRCULATION: --Intracranial internal carotid arteries: Normal. --Anterior cerebral arteries (ACA): Normal. Both A1 segments are present. Patent anterior communicating artery (a-comm). --Middle cerebral arteries (MCA): Normal. VENOUS SINUSES: As permitted by contrast timing, patent. ANATOMIC VARIANTS: None Review of the MIP images confirms the above findings. IMPRESSION: 1. No emergent large vessel occlusion or high-grade stenosis of the intracranial arteries. 2. Bilateral carotid bifurcation atherosclerosis with less than 50% stenosis by NASCET criteria. 3. Aortic Atherosclerosis (ICD10-I70.0) and Emphysema (ICD10-J43.9). Electronically Signed   By: Ulyses Jarred M.D.   On: 11/27/2019 02:49    PHYSICAL EXAM Pleasant elderly Caucasian lady not in distress.  She is extremely hard of hearing. . Afebrile. Head is nontraumatic. Neck is supple without bruit.    Cardiac exam no murmur or gallop. Lungs are clear to auscultation. Distal pulses are well felt. Neurological Exam : She is awake alert oriented to place and person but not to time.  Speech is clear without dysarthria or aphasia.  She is extremely hard of hearing and has difficult time following commands but follows simple midline and one-step commands.  Extraocular movements are full range without nystagmus.  She blinks to threat bilaterally.  Face appears symmetric.  Tongue midline motor system exam she is able to move all 4 extremities well against gravity but is not very cooperative for detailed muscle testing on the right.  Sensation appears preserved bilaterally.  Both plantars are downgoing.  Gait not tested. ASSESSMENT/PLAN Makayla Fisher is a 86 y.o. female with history of non-Hodgkin's lymphoma, MI, iron deficiency anemia presenting with altered mental status, right-sided weakness and abnormal speech. Received tPA 11/27/2019 at 0231.  Stroke vs TIA:  L brain infarct s/p tPA, workup underway  Code Stroke CT head No acute  abnormality. ASPECTS 10.     CTA head & neck no LVO. B ICA stenosis < 50%. Aortic atherosclerosis. Emphysema.   MRI  pending   2D Echo pending   LDL 136  HgbA1c 5.4  VTE prophylaxis - SCDs   aspirin 81 mg daily prior to admission, now on No antithrombotic as within 24h of tPA administration. Will add antithrombotic during rounds tomorrow based on imaging results.   Therapy recommendations:  [place] S[facility]. Keep in bed today, OOB in am. Orders adjusted  Disposition:  pending   Hypertension  Home meds:  Lisinopril 20  Stable . Treated w/ cleviprex BP goal per post tPA protocol x 24h following tPA administration . Long-term BP goal normotensive  Hyperlipidemia  Home meds:  No statin  Add low dose lipitor 10 given advanced age  LDL 136, goal < 70  Continue statin at discharge  Other Stroke Risk Factors  Advanced age  Former Cigarette smoker  Obesity, Body mass index is 34.27 kg/m., recommend weight loss, diet and exercise as appropriate   Coronary artery disease s/p MI, CABG, stent  Other Active Problems  Hx non-Hodgkin's lymphoma chemo 2015, on maintenance retuxin  iron deficiency anemia  GERD    Hospital day # 0 She presented with sudden onset of speech difficulties and right hemiparesis likely due to left brain stroke and received IV TPA and has  shown significant near complete improvement.  Recommend close neurological monitoring and strict blood pressure control as per post TPA protocol.  Mobilize in bed.  Therapy consults.  Check MRI scan of the brain later today and echocardiogram.  Start aspirin if no post TPA bleed.  Discussed with her nephew and niece at the bedside and answered questions.This patient is critically ill and at significant risk of neurological worsening, death and care requires constant monitoring of vital signs, hemodynamics,respiratory and cardiac monitoring, extensive review of multiple databases, frequent neurological assessment, discussion  with family, other specialists and medical decision making of high complexity.I have made any additions or clarifications directly to the above note.This critical care time does not reflect procedure time, or teaching time or supervisory time of PA/NP/Med Resident etc but could involve care discussion time.  I spent 30 minutes of neurocritical care time  in the care of  this patient.    Antony Contras, MD  To contact Stroke Continuity provider, please refer to [email or web address] After hours, contact General Neurology

## 2019-11-27 NOTE — ED Notes (Signed)
Neurology performed NIH exam unable to score all factors at this time, NIH 9 at this time.

## 2019-11-27 NOTE — Progress Notes (Signed)
Pt SBP 190 at 0435. MD Aroor paged because there was no cleviprex order. [id number] SBP in 200s. Dr. Lorraine Lax called again and verbal order for cleviprex and labetalol given. Cleviprex started immediately. SBP now within range. RN will continue to monitor.

## 2019-11-27 NOTE — Progress Notes (Signed)
PT Cancellation Note  Patient Details Name: Makayla Fisher MRN: [id number] DOB: [birth year]   Cancelled Treatment:    Reason Eval/Treat Not Completed: Active bedrest order   Zenaida Niece 11/27/2019, 8:18 AM

## 2019-11-27 NOTE — Progress Notes (Signed)
OT Cancellation Note  Patient Details Name: Makayla Fisher MRN: [id number] DOB: January 05, 1923   Cancelled Treatment:    Reason Eval/Treat Not Completed: Active bedrest order  [place], OT/L   Acute OT Clinical Specialist Acute Rehabilitation Services Pager [phone] Office [phone]  11/27/2019, 7:47 AM

## 2019-11-28 ENCOUNTER — Inpatient Hospital Stay (HOSPITAL_COMMUNITY): Payer: Medicare Other

## 2019-11-28 ENCOUNTER — Ambulatory Visit: Payer: Medicare Other

## 2019-11-28 DIAGNOSIS — K219 Gastro-esophageal reflux disease without esophagitis: Secondary | ICD-10-CM

## 2019-11-28 DIAGNOSIS — Z8669 Personal history of other diseases of the nervous system and sense organs: Secondary | ICD-10-CM

## 2019-11-28 DIAGNOSIS — R569 Unspecified convulsions: Secondary | ICD-10-CM

## 2019-11-28 DIAGNOSIS — R2990 Unspecified symptoms and signs involving the nervous system: Secondary | ICD-10-CM

## 2019-11-28 DIAGNOSIS — G459 Transient cerebral ischemic attack, unspecified: Principal | ICD-10-CM

## 2019-11-28 DIAGNOSIS — E669 Obesity, unspecified: Secondary | ICD-10-CM

## 2019-11-28 DIAGNOSIS — R4182 Altered mental status, unspecified: Secondary | ICD-10-CM

## 2019-11-28 HISTORY — DX: Unspecified symptoms and signs involving the nervous system: R29.90

## 2019-11-28 MED ORDER — ROPINIROLE HCL 1 MG PO TABS
1.0000 mg | ORAL_TABLET | Freq: Once | ORAL | Status: AC
  Administered 2019-11-28: 1 mg via ORAL
  Filled 2019-11-28: qty 1

## 2019-11-28 MED ORDER — PSYLLIUM 58.6 % PO PACK: 1.0000 | PACK | ORAL | Status: DC

## 2019-11-28 MED ORDER — CLOPIDOGREL BISULFATE 75 MG PO TABS: 75.0000 mg | ORAL_TABLET | Freq: Every day | ORAL | 2 refills | Status: DC

## 2019-11-28 MED ORDER — CLOPIDOGREL BISULFATE 75 MG PO TABS
75.0000 mg | ORAL_TABLET | Freq: Every day | ORAL | Status: DC
  Administered 2019-11-28: 75 mg via ORAL
  Filled 2019-11-28: qty 1

## 2019-11-28 MED ORDER — ATORVASTATIN CALCIUM 10 MG PO TABS: 10.0000 mg | ORAL_TABLET | Freq: Every day | ORAL | 2 refills | Status: DC

## 2019-11-28 MED ORDER — ASPIRIN 81 MG PO TBEC: 81.0000 mg | DELAYED_RELEASE_TABLET | Freq: Every day | ORAL | 0 refills | Status: AC

## 2019-11-28 MED ORDER — ASPIRIN EC 81 MG PO TBEC
81.0000 mg | DELAYED_RELEASE_TABLET | Freq: Every day | ORAL | Status: DC
  Administered 2019-11-28: 81 mg via ORAL
  Filled 2019-11-28: qty 1

## 2019-11-28 NOTE — TOC Initial Note (Addendum)
Transition of Care [facility]) - Initial/Assessment Note    Patient Details  Name: Makayla Fisher MRN: [id number] Date of Birth: Mar 18, 1923  Transition of Care [facility]) CM/SW Contact:    Makayla Feil, LCSW Phone Number: 11/28/2019, 4:12 PM  Clinical Narrative: Patient medically stable for discharge and staff person at [facility] contacted - Makayla Fisher - Resident Care Coordinator [phone]) regarding discharge. Discharge paperwork including d/c summary, FL-2 and H&P faxed to facility for review.   Patient's niece Makayla Fisher [phone]) contacted and informed of discharge and she was in agreement with patient returning to ALF. Per [name] Fisher, she is coming to the hospital to see patient.                     Expected Discharge Plan: Assisted Living ([facility]) Barriers to Discharge: No Barriers Identified   Patient Goals and CMS Choice Patient states their goals for this hospitalization and ongoing recovery are:: Niece informed that patient ready for discharge back to ALF and she is in agreement CMS Medicare.gov Compare Post Acute Care list provided to:: Other (Comment Required) (Patient from ALF and returning) Choice offered to / list presented to : NA  Expected Discharge Plan and Services Expected Discharge Plan: Assisted Living ([facility]) In-house Referral: Clinical Social Work Discharge Planning Services: CM Consult   Living arrangements for the past 2 months: [place] ([facility]) Expected Discharge Date: 11/28/19                                    Prior Living Arrangements/Services Living arrangements for the past 2 months: [place] ([facility]) Lives with:: Facility Resident ([facility]) Patient language and need for interpreter reviewed:: No Do you feel safe going back to the place where you live?: Yes      Need for Family Participation in Patient Care: Yes (Comment) Care giver support  system in place?: Yes (comment) (Patient at ALF)   Criminal Activity/Legal Involvement Pertinent to Current Situation/Hospitalization: No - Comment as needed  Activities of Daily Living Home Assistive Devices/Equipment: Eyeglasses, Hearing aid, Dentures (specify type) ADL Screening (condition at time of admission) Patient's cognitive ability adequate to safely complete daily activities?: No Is the patient deaf or have difficulty hearing?: Yes Does the patient have difficulty seeing, even when wearing glasses/contacts?: Yes Does the patient have difficulty concentrating, remembering, or making decisions?: Yes Patient able to express need for assistance with ADLs?: Yes Does the patient have difficulty dressing or bathing?: Yes Independently performs ADLs?: No Communication: Independent Dressing (OT): Dependent Is this a change from baseline?: Pre-admission baseline Grooming: Needs assistance Is this a change from baseline?: Pre-admission baseline Feeding: Needs assistance Is this a change from baseline?: Pre-admission baseline Bathing: Dependent Is this a change from baseline?: Pre-admission baseline Toileting: Dependent Is this a change from baseline?: Pre-admission baseline In/Out Bed: Dependent Is this a change from baseline?: Pre-admission baseline Walks in Home: Dependent Is this a change from baseline?: Pre-admission baseline Does the patient have difficulty walking or climbing stairs?: Yes Weakness of Legs: Both Weakness of Arms/Hands: Both  Permission Sought/Granted Permission sought to share information with : Other (comment) (Patient not totally oriented, talked with niece regarding discharge) Permission granted to share information with : No (Patient not asked as not oriented to situation)              Emotional  Assessment Appearance:: Other (Comment Required (Did not visit with patient as not totally oriented) Attitude/Demeanor/Rapport: Unable to Assess Affect  (typically observed): Unable to Assess Orientation: : Oriented to  Time, Oriented to Place, Oriented to Self Alcohol / Substance Use: Tobacco Use, Alcohol Use, Illicit Drugs (Per H&P, patient quit smoking and does not drink or use illicit drugs) Psych Involvement: No (comment)  Admission diagnosis:  Stroke (cerebrum) [facility]) [I63.9] Patient Active Problem List   Diagnosis Date Noted  . Obesity 11/28/2019  . Hx of migraines 11/28/2019  . GERD (gastroesophageal reflux disease) 11/28/2019  . Stroke-like symptoms s/p IV tPA 11/28/2019  . Stroke (cerebrum) ([place]) 11/27/2019  . TIA (transient ischemic attack) 04/30/2017  . HLD (hyperlipidemia) 04/30/2017  . Hx of CABG x 3/stent 04/30/2017  . Bilateral carotid artery stenosis 04/30/2017  . Non Hodgkin's lymphoma ([place]) 04/30/2017  . Hypothyroidism 04/30/2017  . Hypertension 04/30/2017  . S/P colostomy ([place]) 04/30/2017  . Anxiety disorder 04/30/2017  . Abnormal urinalysis 04/30/2017  . CAP (community acquired pneumonia) 12/13/2016  . Acute lower UTI 12/13/2016  . Glaucoma 12/13/2016  . Colostomy in place [facility]) 12/13/2016  . Vitamin D deficiency 10/20/2016  . Follicular lymphoma grade IIIa of intra-abdominal lymph nodes ([place]) 04/22/2016  . Iron deficiency anemia due to chronic blood loss 08/07/2014  . HTN (hypertension) 07/15/2014  . Unilateral carotid artery disease ([place]) 07/15/2014  . Bradycardia 07/15/2014  . 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 Preop cardiovascular exam 04/08/2014  . NHL (non-Hodgkin's lymphoma) ([place]) 10/18/2013  . 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

## 2019-11-28 NOTE — Progress Notes (Signed)
OT Cancellation Note  Patient Details Name: Arnesia Vincelette MRN: [id number] DOB: [birth year]   Cancelled Treatment:    Reason Eval/Treat Not Completed: Patient at procedure or test/ unavailable.  Pt undergoing EEG.  Will reattempt.  Nilsa Nutting., OTR/L Acute Rehabilitation Services Pager [phone] Office [phone]   Lucille Passy M 11/28/2019, 11:05 AM

## 2019-11-28 NOTE — Procedures (Signed)
Patient Name: Makayla Fisher  MRN: [id number]  Epilepsy Attending: Lora Havens  Referring Physician/Provider: Dr. Antony Contras Date: 11/28/2019 Duration: 24.45 minutes  Patient history: 86 year old female initially presented with altered mental status, right-sided weakness and abnormal speech.  MRI brain did not show any evidence of stroke.  EEG to evaluate for seizures.  Level of alertness: Awake  AEDs during EEG study: None  Technical aspects: This EEG study was done with scalp electrodes positioned according to the 10-20 International system of electrode placement. Electrical activity was acquired at a sampling rate of 500Hz  and reviewed with a high frequency filter of 70Hz  and a low frequency filter of 1Hz . EEG data were recorded continuously and digitally stored.   Description: The posterior dominant rhythm consists of 9 Hz activity of moderate voltage (25-35 uV) seen predominantly in posterior head regions, symmetric and reactive to eye opening and eye closing. EEG showed intermittent left temporal 2-3Hz  delta slowing.  Hyperventilation and photic stimulation were not performed.     ABNORMALITY -Intermittent slow, left temporal region  IMPRESSION: This study is suggestive of nonspecific cortical dysfunction left temporal region.  No seizures or epileptiform discharges were seen throughout the recording.  Huston Stonehocker Barbra Sarks

## 2019-11-28 NOTE — TOC Transition Note (Signed)
Transition of Care [facility]) - CM/SW Discharge Note   Patient Details  Name: Makayla Fisher MRN: [id number] Date of Birth: 03/15/1923  Transition of Care [facility]) CM/SW Contact:  Ella Bodo, RN Phone Number: 11/28/2019, 4:13 PM   Clinical Narrative:   Pt to return to ALF today as prior to admission.  PT/OT and ST recommending HH follow up at facility.  Spoke with Dotty, residential coordinator at facility:  She states that facility has on-site [facility] agency that can provide services at dc.  Will fax [facility] orders to her, per request.  Fax # [phone].  CSW to facilitate discharge arrangements.      Final next level of care: Assisted Living Barriers to Discharge: No Barriers Identified   Patient Goals and CMS Choice Patient states their goals for this hospitalization and ongoing recovery are:: Niece informed that patient ready for discharge back to ALF and she is in agreement CMS Medicare.gov Compare Post Acute Care list provided to:: Other (Comment Required) (Patient from ALF and returning) Choice offered to / list presented to : NA  Discharge Placement                       Discharge Plan and Services In-house Referral: Clinical Social Work Discharge Planning Services: CM Consult                                 Social Determinants of Health (SDOH) Interventions     Readmission Risk Interventions No flowsheet data found.  Reinaldo Raddle, RN, BSN  Trauma/Neuro ICU Case Manager [phone]

## 2019-11-28 NOTE — Evaluation (Signed)
Physical Therapy Evaluation Patient Details Name: Makayla Fisher MRN: [id number] DOB: 1922/06/07 Today's Date: 11/28/2019   History of Present Illness  86 y.o. female with past medical history significant for non-Hodgkin's lymphoma, MI, iron deficiency anemia presents to the emergency department as a code stroke on 8/4 for sudden onset altered mental status, right-sided weakness and abnormal speech. TPA administered, MRI negative for acute findings. Possible TIA vs seizure activity, awaiting EEG.  Clinical Impression   Pt presents with generalized weakness, difficulty performing mobility tasks vs baseline, impaired standing balance, and decreased activity tolerance vs baseline. Pt to benefit from acute PT to address deficits. Pt ambulated hallway distance with use of RW and min guard for safety, verbal cuing for form and safety with use of RW. Pt required min assist for bed mobility and transfer OOB, per pt she is typically independent in these tasks. PT recommending SNF level of care post-acutely, vs home with HHPT pending pt progress. PT to progress mobility as tolerated, and will continue to follow acutely.      Follow Up Recommendations Home health PT;SNF;Supervision for mobility/OOB    Equipment Recommendations  None recommended by PT    Recommendations for Other Services       Precautions / Restrictions Precautions Precautions: Fall Restrictions Weight Bearing Restrictions: No      Mobility  Bed Mobility Overal bed mobility: Needs Assistance Bed Mobility: Supine to Sit     Supine to sit: Min assist;HOB elevated     General bed mobility comments: min assist for trunk elevation via HHA. verbal cuing for sequencing and scooting to EOB.  Transfers Overall transfer level: Needs assistance Equipment used: Rolling walker (2 wheeled) Transfers: Sit to/from Stand Sit to Stand: Min assist;From elevated surface         General transfer comment: Min assist for initial  power up and steadying upon standing. Verbal cuing for sequencing to EOB.  Ambulation/Gait Ambulation/Gait assistance: Min guard Gait Distance (Feet): 75 Feet Assistive device: Rolling walker (2 wheeled) Gait Pattern/deviations: Step-through pattern;Decreased stride length;Trunk flexed Gait velocity: decr   General Gait Details: Min gaurd for safety, verbal cuing for upright posture, placement in RW, hallway navigation.  Stairs            Wheelchair Mobility    Modified Rankin (Stroke Patients Only) Modified Rankin (Stroke Patients Only) Pre-Morbid Rankin Score: Moderate disability Modified Rankin: Moderately severe disability     Balance Overall balance assessment: Needs assistance Sitting-balance support: Feet unsupported;Single extremity supported Sitting balance-Leahy Scale: Good     Standing balance support: Bilateral upper extremity supported Standing balance-Leahy Scale: Poor Standing balance comment: reliant on external support                             Pertinent Vitals/Pain Pain Assessment: No/denies pain    Home Living Family/patient expects to be discharged to:: Assisted living     Type of Home: Assisted living         Home Equipment: Walker - 4 wheels      Prior Function Level of Independence: Independent with assistive device(s);Needs assistance   Gait / Transfers Assistance Needed: pt walks with rollator, ambulates to and from dining room without assist.  ADL's / Homemaking Assistance Needed: Pt reports independence with dressing and bathing PTA, states all meals are prepared for her and she takes her meals in dining room.  Comments: Pt reports 1 fall 5 years ago, no falls since. Pt enjoys doing  art classes and ALF events, sitting on porch at ALF.     Hand Dominance   Dominant Hand: Right    Extremity/Trunk Assessment   Upper Extremity Assessment Upper Extremity Assessment: Defer to OT evaluation    Lower Extremity  Assessment Lower Extremity Assessment: Generalized weakness (At least 3/5 bilaterally, no difference in strength R v L in hip flexion/extension, hip abd/add, knee extension/flexion, or DF/PF)    Cervical / Trunk Assessment Cervical / Trunk Assessment: Kyphotic  Communication   Communication: HOH  Cognition Arousal/Alertness: Awake/alert Behavior During Therapy: WFL for tasks assessed/performed Overall Cognitive Status: Within Functional Limits for tasks assessed                                 General Comments: A&Ox4, answers questions appropriately when heard as pt VERY HOH.      General Comments      Exercises     Assessment/Plan    PT Assessment Patient needs continued PT services  PT Problem List Decreased strength;Decreased mobility;Decreased activity tolerance;Decreased balance       PT Treatment Interventions DME instruction;Therapeutic activities;Gait training;Therapeutic exercise;Patient/family education;Balance training;Functional mobility training;Neuromuscular re-education    PT Goals (Current goals can be found in the Care Plan section)  Acute Rehab PT Goals Patient Stated Goal: go home PT Goal Formulation: With patient Time For Goal Achievement: 12/12/19 Potential to Achieve Goals: Good    Frequency Min 3X/week   Barriers to discharge        Co-evaluation               AM-PAC PT "6 Clicks" Mobility  Outcome Measure Help needed turning from your back to your side while in a flat bed without using bedrails?: A Little Help needed moving from lying on your back to sitting on the side of a flat bed without using bedrails?: A Little Help needed moving to and from a bed to a chair (including a wheelchair)?: A Little Help needed standing up from a chair using your arms (e.g., wheelchair or bedside chair)?: A Little Help needed to walk in hospital room?: A Little Help needed climbing 3-5 steps with a railing? : A Lot 6 Click Score: 17     End of Session Equipment Utilized During Treatment: Gait belt Activity Tolerance: Patient tolerated treatment well;Patient limited by fatigue Patient left: in chair;with call bell/phone within reach;with chair alarm set;with family/visitor present Nurse Communication: Mobility status PT Visit Diagnosis: Other abnormalities of gait and mobility (R26.89);Muscle weakness (generalized) (M62.81)    Time: 0923-3007 PT Time Calculation (min) (ACUTE ONLY): 28 min   Charges:   PT Evaluation $PT Eval Low Complexity: 1 Low PT Treatments $Gait Training: 8-22 mins        Makayla Fisher E, PT Acute Rehabilitation Services Pager [phone]  Office [phone]  Dujuan Stankowski D Cheyane Ayon 11/28/2019, 12:21 PM

## 2019-11-28 NOTE — Progress Notes (Signed)
EEG complete - results pending 

## 2019-11-28 NOTE — TOC Transition Note (Addendum)
Transition of Care [facility]) - CM/SW Discharge Note *Discharged to [facility] *Phone number for Report - [phone] Ask For: [place] Coordinator   Patient Details  Name: Makayla Fisher MRN: [id number] Date of Birth: 1922/05/21  Transition of Care [facility]) CM/SW Contact:  [name], LCSW Phone Number: 11/28/2019, 4:20 PM   Clinical Narrative:  CSW advised that patient medically stable for discharge and is from [place]. Contact made with [place] [phone]) regarding discharge and discharge clinicals faxed to her for review prior to patient leaving hospital. Signed FL-2 faxed to Dottie at [facility] and copy also placed in discharge packet going with patient to facility.  Patient will be transported by non-emergency ambulance transport. Niece, [name] [phone]) contacted and informed regarding discharge. Visited with patient and talked with her about her discharge back to [facility].       Final next level of care: Assisted Living ([facility]) Barriers to Discharge: No Barriers Identified   Patient Goals and CMS Choice Patient states their goals for this hospitalization and ongoing recovery are:: Niece informed of discharge and is in agreement with patient returning to ALF CMS Medicare.gov Compare Post Acute Care list provided to:: Other (Comment Required) (Not needed as patient returning to ALF) Choice offered to / list presented to : NA  Discharge Placement - [facility]                Patient to be transferred to facility by: Non-emergency ambulance transport Name of family member notified: Niece [name] [phone]) Patient and family notified of of transfer: 11/28/19  Discharge Plan and Services In-house Referral: Clinical Social Work Discharge Planning Services: [employer] Consult                                Social Determinants of Health (SDOH)  Interventions  No SDOH interventions requested or needed at discharge   Readmission Risk Interventions No flowsheet data found.

## 2019-11-28 NOTE — NC FL2 (Addendum)
[place] LEVEL OF CARE SCREENING TOOL     IDENTIFICATION  Patient Name: Makayla Fisher Birthdate: 01-08-1923 Sex: female Admission Date (Current Location): 11/27/2019  [facility] [place] Number:  Herbalist and Address:  [facility], [place] [address] [place], [place] [address]      Provider Number: [id number]  Attending Physician Name and Address:  Garvin Fila, MD  Relative Name and Phone Number:  [name] - [phone]    Current Level of Care: Hospital Recommended Level of Care: [place] ([facility]) Prior Approval Number:    Date Approved/Denied:   PASRR Number:    Discharge Plan: Other (Comment) ([place])    Current Diagnoses: Patient Active Problem List   Diagnosis Date Noted   Obesity 11/28/2019   Hx of migraines 11/28/2019   GERD (gastroesophageal reflux disease) 11/28/2019   Stroke-like symptoms s/p IV tPA 11/28/2019   Stroke (cerebrum) ([place]) 11/27/2019   TIA (transient ischemic attack) 04/30/2017   HLD (hyperlipidemia) 04/30/2017   Hx of CABG x 3/stent 04/30/2017   Bilateral carotid artery stenosis 04/30/2017   Non Hodgkin's lymphoma ([place]) 04/30/2017   Hypothyroidism 04/30/2017   Hypertension 04/30/2017   S/P colostomy ([place]) 04/30/2017   Anxiety disorder 04/30/2017   Abnormal urinalysis 04/30/2017   CAP (community acquired pneumonia) 12/13/2016   Acute lower UTI 12/13/2016   Glaucoma 12/13/2016   Colostomy in place ([place]) 12/13/2016   Vitamin D deficiency 47/12/6281   Follicular lymphoma grade IIIa of intra-abdominal lymph nodes ([place]) 04/22/2016   Iron deficiency anemia due to chronic blood loss 08/07/2014   HTN (hypertension) 07/15/2014   Unilateral carotid artery disease ([place]) 07/15/2014   Bradycardia 07/15/2014   Colon stricture ([place]) 06/13/2014   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[place]) 06/12/2014   Preop cardiovascular exam 04/08/2014   NHL  (non-Hodgkin's lymphoma) ([place]) 10/18/2013   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    Orientation RESPIRATION BLADDER Height & Weight    Person, Place, time    Normal External catheter, Incontinent (Catheter place less than one day ago) Weight: 187 lb 6.3 oz (85 kg) Height:     BEHAVIORAL SYMPTOMS/MOOD NEUROLOGICAL BOWEL NUTRITION STATUS      Continent, Colostomy (Patient has ostomy pouch) Diet (Regular diet)  AMBULATORY STATUS COMMUNICATION OF NEEDS Skin   Supervision Verbally Normal                       Personal Care Assistance Level of Assistance  Bathing, Feeding, Dressing Bathing Assistance: Limited assistance (Min assist per OT) Feeding assistance: Independent (Modified Independent) Dressing Assistance: Limited assistance (Min assist per OT)     Functional Limitations Info  Sight, Hearing, Speech Sight Info: Adequate Hearing Info: Adequate Speech Info: Adequate    SPECIAL CARE FACTORS FREQUENCY  Speech therapy             Speech Therapy Frequency: Speech eval 8/4      Contractures Contractures Info: Not present    Additional Factors Info  Code Status, Allergies Code Status Info: Full Allergies Info: Epipen, Lovaquin, Chlorhexidine Gluconate, Heparin, Sulfa Antibiotics           Current Medications (11/28/2019):  This is the current hospital active medication list Current Facility-Administered Medications  Medication Dose Route Frequency Provider Last Rate Last Admin   0.9 %  sodium chloride infusion   Intravenous Continuous Aroor, Lanice Schwab, MD 75 mL/hr at 11/28/19 0800 Rate Verify at 11/28/19 0800  acetaminophen (TYLENOL) tablet 650 mg  650 mg Oral Q4H PRN Aroor, Lanice Schwab, MD   650 mg at 11/27/19 1745   Or   acetaminophen (TYLENOL) 160 MG/5ML solution 650 mg  650 mg Per Tube Q4H PRN Aroor, Lanice Schwab, MD       Or   acetaminophen (TYLENOL) suppository 650 mg  650 mg Rectal Q4H PRN Aroor, Lanice Schwab, MD       aspirin EC tablet 81 mg  81 mg Oral  Daily Biby, Ivin Booty L, NP   81 mg at 11/28/19 1248   atorvastatin (LIPITOR) tablet 10 mg  10 mg Oral Daily Burnetta Sabin L, NP   10 mg at 11/28/19 1248   clevidipine (CLEVIPREX) infusion 0.5 mg/mL  0-21 mg/hr Intravenous Continuous Aroor, Lanice Schwab, MD   Stopped at 11/28/19 0223   clopidogrel (PLAVIX) tablet 75 mg  75 mg Oral Daily Burnetta Sabin L, NP   75 mg at 11/28/19 1247   labetalol (NORMODYNE) injection 10 mg  10 mg Intravenous Q2H PRN Aroor, Lanice Schwab, MD   10 mg at 11/27/19 2113   levothyroxine (SYNTHROID) tablet 50 mcg  50 mcg Oral QAC breakfast Donzetta Starch, NP   50 mcg at 11/28/19 0553   lisinopril (ZESTRIL) tablet 20 mg  20 mg Oral Daily Burnetta Sabin L, NP   20 mg at 11/28/19 1248   mupirocin ointment (BACTROBAN) 2 % 1 application  1 application Nasal BID Aroor, Lanice Schwab, MD   1 application at 61/60/73 1253   polyvinyl alcohol (LIQUIFILM TEARS) 1.4 % ophthalmic solution 1 drop  1 drop Both Eyes TID Garvin Fila, MD   1 drop at 11/28/19 1254   psyllium (HYDROCIL/METAMUCIL) packet 1 packet  1 packet Oral Q M,W,F Donzetta Starch, NP       senna-docusate (Senokot-S) tablet 1 tablet  1 tablet Oral QHS PRN Aroor, Lanice Schwab, MD   1 tablet at 11/27/19 2050   sertraline (ZOLOFT) tablet 12.5 mg  12.5 mg Oral Daily Burnetta Sabin L, NP   12.5 mg at 11/28/19 1248   umeclidinium bromide (INCRUSE ELLIPTA) 62.5 MCG/INH 1 puff  1 puff Inhalation Daily Blenda Nicely, [facility]         Discharge Medications: Please see discharge summary for a list of discharge medications.  Relevant Imaging Results:  Relevant Lab Results:   Additional Information [id number]   DISCHARGE MEDICATIONS: Medication List     TAKE these medications   aspirin 81 MG EC tablet Take 1 tablet (81 mg total) by mouth daily for 21 days.    atorvastatin 10 MG tablet Commonly known as: LIPITOR Take 1 tablet (10 mg total) by mouth daily. Start taking on: November 29, 2019    CALCIUM 600+D3 PO Take 1 tablet by mouth at  bedtime.    clopidogrel 75 MG tablet Commonly known as: PLAVIX Take 1 tablet (75 mg total) by mouth daily. Start taking on: November 29, 2019    Cranberry 425 MG Caps Take 425 mg by mouth daily.    levothyroxine 50 MCG tablet Commonly known as: SYNTHROID Take 50 mcg by mouth daily before breakfast.    lidocaine-prilocaine cream Commonly known as: EMLA Apply 1 application topically See admin instructions. Apply to port-a-cath one hour prior to procedure    lisinopril 20 MG tablet Commonly known as: ZESTRIL Take 1 tablet (20 mg total) by mouth daily.    multivitamin with minerals Tabs tablet Take 1 tablet by mouth daily at 12  noon.    NEOMYCIN-POLYMYXIN-HYDROCORTISONE 1 % Soln OTIC solution Commonly known as: CORTISPORIN Apply 1-2 drops to toe BID after soaking What changed:  how much to take when to take this additional instructions    nystatin cream Commonly known as: MYCOSTATIN Apply 1 application topically every 12 (twelve) hours as needed (to rash between the legs and groin).    psyllium 58.6 % packet Commonly known as: METAMUCIL Take 1 packet by mouth every Monday, Wednesday, and Friday. ORANGE Start taking on: November 29, 2019 What changed:  how much to take how to take this when to take this additional instructions Another medication with the same name was removed. Continue taking this medication, and follow the directions you see here.    rOPINIRole 1 MG tablet Commonly known as: REQUIP Take 1 mg by mouth at bedtime.    sertraline 25 MG tablet Commonly known as: ZOLOFT Take 12.5 mg by mouth daily.    Spiriva HandiHaler 18 MCG inhalation capsule Generic drug: tiotropium Place 18 mcg into inhaler and inhale daily.    Systane Complete 0.6 % Soln Generic drug: Propylene Glycol Place 1 drop into both eyes 3 (three) times daily.    Vitamin D3 Super Strength 50 MCG (2000 UT) Caps Generic drug: Cholecalciferol Take 4,000 Units by mouth daily.            [name], LCSW   I have personally obtained history,examined this patient, reviewed notes, independently viewed imaging studies, participated in medical decision making and plan of care.ROS completed by me personally and pertinent positives fully documented  I have made any additions or clarifications directly to the above note. Agree with note above.  Antony Contras, MD Medical Director [place] Pager: [phone] 11/28/2019 5:23 PM

## 2019-11-28 NOTE — Discharge Summary (Addendum)
Stroke Discharge Summary  Patient ID: Makayla Fisher   MRN: [id number]      DOB: June 23, 1922  Date of Admission: 11/27/2019 Date of Discharge: 11/28/2019  Attending Physician:  Garvin Fila, MD, Stroke MD Consultant(s):    None  Patient's PCP:  [name], DO  DISCHARGE DIAGNOSIS:  Principal Problem:   TIA (transient ischemic attack) Active Problems:   HTN (hypertension)   Colostomy in place ([place])   HLD (hyperlipidemia)   Hx of CABG x 3/stent   Non Hodgkin's lymphoma ([place])   Hypothyroidism   Stroke (cerebrum) ([place])   Obesity   Hx of migraines   GERD (gastroesophageal reflux disease)   Stroke-like symptoms s/p IV tPA   Allergies as of 11/28/2019       Reactions   Epipen [epinephrine Hcl (nasal)] Palpitations   Levaquin [levofloxacin In D5w] Other (See Comments)   Weakness- "Allergic," per MAR   Chlorhexidine Gluconate Other (See Comments)   Pt declined use and suspects she may have an intolerance   Heparin Other (See Comments)   "Alleregic," pre MAR- Maybe affected platelets, per patient   Sulfa Antibiotics Rash        Medication List     TAKE these medications    aspirin 81 MG EC tablet Take 1 tablet (81 mg total) by mouth daily for 21 days.   atorvastatin 10 MG tablet Commonly known as: LIPITOR Take 1 tablet (10 mg total) by mouth daily. Start taking on: November 29, 2019   CALCIUM 600+D3 PO Take 1 tablet by mouth at bedtime.   clopidogrel 75 MG tablet Commonly known as: PLAVIX Take 1 tablet (75 mg total) by mouth daily. Start taking on: November 29, 2019   Cranberry 425 MG Caps Take 425 mg by mouth daily.   levothyroxine 50 MCG tablet Commonly known as: SYNTHROID Take 50 mcg by mouth daily before breakfast.   lidocaine-prilocaine cream Commonly known as: EMLA Apply 1 application topically See admin instructions. Apply to port-a-cath one hour prior to procedure   lisinopril 20 MG tablet Commonly known as: ZESTRIL Take 1 tablet (20 mg total)  by mouth daily.   multivitamin with minerals Tabs tablet Take 1 tablet by mouth daily at 12 noon.   NEOMYCIN-POLYMYXIN-HYDROCORTISONE 1 % Soln OTIC solution Commonly known as: CORTISPORIN Apply 1-2 drops to toe BID after soaking What changed:  how much to take when to take this additional instructions   nystatin cream Commonly known as: MYCOSTATIN Apply 1 application topically every 12 (twelve) hours as needed (to rash between the legs and groin).   psyllium 58.6 % packet Commonly known as: METAMUCIL Take 1 packet by mouth every Monday, Wednesday, and Friday. ORANGE Start taking on: November 29, 2019 What changed:  how much to take how to take this when to take this additional instructions Another medication with the same name was removed. Continue taking this medication, and follow the directions you see here.   rOPINIRole 1 MG tablet Commonly known as: REQUIP Take 1 mg by mouth at bedtime.   sertraline 25 MG tablet Commonly known as: ZOLOFT Take 12.5 mg by mouth daily.   Spiriva HandiHaler 18 MCG inhalation capsule Generic drug: tiotropium Place 18 mcg into inhaler and inhale daily.   Systane Complete 0.6 % Soln Generic drug: Propylene Glycol Place 1 drop into both eyes 3 (three) times daily.   Vitamin D3 Super Strength 50 MCG (2000 UT) Caps Generic drug: Cholecalciferol Take 4,000 Units by mouth daily.  LABORATORY STUDIES CBC    Component Value Date/Time   WBC 8.6 11/27/2019 0203   RBC 4.51 11/27/2019 0203   HGB 15.0 11/27/2019 0208   HGB 12.7 11/21/2019 1340   HGB 12.5 01/31/2017 0931   HGB 12.4 07/21/2016 1046   HCT 44.0 11/27/2019 0208   HCT 37.8 01/31/2017 0931   HCT 37.5 07/21/2016 1046   PLT 148 (L) 11/27/2019 0203   PLT 155 11/21/2019 1340   PLT 153 01/31/2017 0931   PLT 161 07/21/2016 1046   MCV 96.0 11/27/2019 0203   MCV 98 01/31/2017 0931   MCV 97.1 07/21/2016 1046   MCH 30.4 11/27/2019 0203   MCHC 31.6 11/27/2019 0203   RDW  13.7 11/27/2019 0203   RDW 14.6 01/31/2017 0931   RDW 16.3 (H) 07/21/2016 1046   LYMPHSABS 2.9 11/27/2019 0203   LYMPHSABS 1.5 01/31/2017 0931   LYMPHSABS 1.0 07/21/2016 1046   MONOABS 0.8 11/27/2019 0203   MONOABS 0.9 07/21/2016 1046   EOSABS 0.3 11/27/2019 0203   EOSABS 0.2 01/31/2017 0931   BASOSABS 0.1 11/27/2019 0203   BASOSABS 0.0 01/31/2017 0931   BASOSABS 0.1 07/21/2016 1046   CMP    Component Value Date/Time   NA 138 11/27/2019 0208   NA 143 01/31/2017 0931   NA 141 05/08/2014 1041   K 3.5 11/27/2019 0208   K 3.7 01/31/2017 0931   K 4.5 05/08/2014 1041   CL 101 11/27/2019 0208   CL 106 01/31/2017 0931   CO2 26 11/27/2019 0203   CO2 27 01/31/2017 0931   CO2 24 05/08/2014 1041   GLUCOSE 149 (H) 11/27/2019 0208   GLUCOSE 94 01/31/2017 0931   BUN 14 11/27/2019 0208   BUN 10 01/31/2017 0931   BUN 14.2 05/08/2014 1041   CREATININE 0.90 11/27/2019 0208   CREATININE 1.11 (H) 08/20/2019 1445   CREATININE 0.9 01/31/2017 0931   CREATININE 0.8 05/08/2014 1041   CALCIUM 10.1 11/27/2019 0203   CALCIUM 9.7 01/31/2017 0931   CALCIUM 9.2 05/08/2014 1041   PROT 6.3 (L) 11/27/2019 0203   PROT 6.2 (L) 01/31/2017 0931   PROT 6.1 (L) 05/08/2014 1041   ALBUMIN 3.8 11/27/2019 0203   ALBUMIN 3.6 01/31/2017 0931   ALBUMIN 3.6 05/08/2014 1041   AST 26 11/27/2019 0203   AST 14 (L) 08/20/2019 1445   AST 23 05/08/2014 1041   ALT 15 11/27/2019 0203   ALT 8 08/20/2019 1445   ALT 14 01/31/2017 0931   ALT 10 05/08/2014 1041   ALKPHOS 63 11/27/2019 0203   ALKPHOS 57 01/31/2017 0931   ALKPHOS 66 05/08/2014 1041   BILITOT 0.8 11/27/2019 0203   BILITOT 0.3 08/20/2019 1445   BILITOT 0.42 05/08/2014 1041   GFRNONAA 52 (L) 11/27/2019 0203   GFRNONAA 42 (L) 08/20/2019 1445   GFRAA >60 11/27/2019 0203   GFRAA 49 (L) 08/20/2019 1445   COAGS Lab Results  Component Value Date   INR 1.1 11/27/2019   INR 0.96 04/30/2017   INR 1.21 12/13/2016   Lipid Panel    Component Value  Date/Time   CHOL 213 (H) 11/27/2019 0547   TRIG 97 11/27/2019 0547   HDL 58 11/27/2019 0547   CHOLHDL 3.7 11/27/2019 0547   VLDL 19 11/27/2019 0547   LDLCALC 136 (H) 11/27/2019 0547   HgbA1C  Lab Results  Component Value Date   HGBA1C 5.4 11/27/2019   Urinalysis    Component Value Date/Time   COLORURINE STRAW (A) 11/27/2019 0421   APPEARANCEUR CLEAR  11/27/2019 0421   LABSPEC 1.013 11/27/2019 0421   PHURINE 7.0 11/27/2019 0421   GLUCOSEU NEGATIVE 11/27/2019 0421   HGBUR NEGATIVE 11/27/2019 0421   BILIRUBINUR NEGATIVE 11/27/2019 0421   KETONESUR NEGATIVE 11/27/2019 0421   PROTEINUR 30 (A) 11/27/2019 0421   UROBILINOGEN 0.2 08/19/2013 1624   NITRITE NEGATIVE 11/27/2019 0421   LEUKOCYTESUR SMALL (A) 11/27/2019 0421   Urine Drug Screen     Component Value Date/Time   LABOPIA NONE DETECTED 11/27/2019 0421   COCAINSCRNUR NONE DETECTED 11/27/2019 0421   LABBENZ NONE DETECTED 11/27/2019 0421   AMPHETMU NONE DETECTED 11/27/2019 0421   THCU NONE DETECTED 11/27/2019 0421   LABBARB NONE DETECTED 11/27/2019 0421    Alcohol Level    Component Value Date/Time   ETH <10 11/27/2019 0203     SIGNIFICANT DIAGNOSTIC STUDIES CT Head Wo Contrast  Result Date: 11/16/2019 CLINICAL DATA:  Headache since 11 a.m., right hand numbness EXAM: CT HEAD WITHOUT CONTRAST TECHNIQUE: Contiguous axial images were obtained from the base of the skull through the vertex without intravenous contrast. COMPARISON:  04/30/2017 FINDINGS: Brain: Confluent hypodensities are again seen scattered throughout the periventricular and subcortical white matter consistent with chronic small vessel ischemic changes. No evidence of acute infarct or hemorrhage. Lateral ventricles and remaining midline structures are unremarkable. No acute extra-axial fluid collections. No mass effect. Vascular: No hyperdense vessel or unexpected calcification. Skull: Normal. Negative for fracture or focal lesion. Sinuses/Orbits: No acute  finding. Other: None. IMPRESSION: 1. Stable chronic small-vessel ischemic changes throughout the white matter. No acute intracranial process. Electronically Signed   By: Randa Ngo M.D.   On: 11/16/2019 20:23   CT ANGIO NECK W OR WO CONTRAST  Result Date: 11/27/2019 CLINICAL DATA:  Aphasia and encephalopathy EXAM: CT ANGIOGRAPHY HEAD AND NECK TECHNIQUE: Multidetector CT imaging of the head and neck was performed using the standard protocol during bolus administration of intravenous contrast. Multiplanar CT image reconstructions and MIPs were obtained to evaluate the vascular anatomy. Carotid stenosis measurements (when applicable) are obtained utilizing NASCET criteria, using the distal internal carotid diameter as the denominator. CONTRAST:  84mL OMNIPAQUE IOHEXOL 350 MG/ML SOLN COMPARISON:  None. FINDINGS: CTA NECK FINDINGS SKELETON: There is no bony spinal canal stenosis. No lytic or blastic lesion. OTHER NECK: Normal pharynx, larynx and major salivary glands. No cervical lymphadenopathy. Unremarkable thyroid gland. UPPER CHEST: Emphysema AORTIC ARCH: There is calcific atherosclerosis of the aortic arch. There is no aneurysm, dissection or hemodynamically significant stenosis of the visualized portion of the aorta. Conventional 3 vessel aortic branching pattern. The visualized proximal subclavian arteries are widely patent. RIGHT CAROTID SYSTEM: No dissection, occlusion or aneurysm. There is calcified atherosclerosis extending into the proximal ICA, resulting in less than 50% stenosis. LEFT CAROTID SYSTEM: No dissection, occlusion or aneurysm. There is calcified atherosclerosis extending into the proximal ICA, resulting in less than 50% stenosis. VERTEBRAL ARTERIES: Codominant configuration. Both origins are clearly patent. There is no dissection, occlusion or flow-limiting stenosis to the skull base (V1-V3 segments). CTA HEAD FINDINGS POSTERIOR CIRCULATION: --Vertebral arteries: Normal V4 segments.  --Inferior cerebellar arteries: Normal. --Basilar artery: Normal. --Superior cerebellar arteries: Normal. --Posterior cerebral arteries (PCA): Normal. ANTERIOR CIRCULATION: --Intracranial internal carotid arteries: Normal. --Anterior cerebral arteries (ACA): Normal. Both A1 segments are present. Patent anterior communicating artery (a-comm). --Middle cerebral arteries (MCA): Normal. VENOUS SINUSES: As permitted by contrast timing, patent. ANATOMIC VARIANTS: None Review of the MIP images confirms the above findings. IMPRESSION: 1. No emergent large vessel occlusion or high-grade stenosis of  the intracranial arteries. 2. Bilateral carotid bifurcation atherosclerosis with less than 50% stenosis by NASCET criteria. 3. Aortic Atherosclerosis (ICD10-I70.0) and Emphysema (ICD10-J43.9). Electronically Signed   By: Ulyses Jarred M.D.   On: 11/27/2019 02:49   MR BRAIN WO CONTRAST  Result Date: 11/28/2019 CLINICAL DATA:  Stroke follow-up.  Encephalopathy. EXAM: MRI HEAD WITHOUT CONTRAST TECHNIQUE: Multiplanar, multiecho pulse sequences of the brain and surrounding structures were obtained without intravenous contrast. COMPARISON:  11/17/2019 FINDINGS: Brain: No acute infarct, acute hemorrhage or extra-axial collection. Early confluent hyperintense T2-weighted signal of the periventricular and deep white matter, most commonly due to chronic ischemic microangiopathy. Normal volume of CSF spaces. No chronic microhemorrhage. Normal midline structures. Vascular: Normal flow voids. Skull and upper cervical spine: Normal marrow signal. Sinuses/Orbits: Negative. Other: None. IMPRESSION: 1. No acute intracranial abnormality. 2. Findings of chronic ischemic microangiopathy. Electronically Signed   By: Ulyses Jarred M.D.   On: 11/28/2019 02:20   MR BRAIN WO CONTRAST  Result Date: 11/17/2019 CLINICAL DATA:  Headache and hypertension EXAM: MRI HEAD WITHOUT CONTRAST TECHNIQUE: Multiplanar, multiecho pulse sequences of the brain and  surrounding structures were obtained without intravenous contrast. COMPARISON:  None. FINDINGS: BRAIN: No acute infarct, acute hemorrhage or extra-axial collection. Early confluent hyperintense T2-weighted signal of the periventricular and deep white matter, most commonly due to chronic ischemic microangiopathy. Normal volume of CSF spaces. No chronic microhemorrhage. Normal midline structures. VASCULAR: The arterial flow voids are normal. There is abnormal flow related signal in the left transverse sinus, sigmoid sinus and internal jugular vein. SKULL AND UPPER CERVICAL SPINE: Normal calvarium and skull base. Visualized upper cervical spine and soft tissues are normal. SINUSES/ORBITS: No paranasal sinus fluid levels or advanced mucosal thickening. No mastoid or middle ear effusion. Normal orbits. IMPRESSION: 1. No acute intracranial abnormality. 2. Findings of chronic ischemic microangiopathy. 3. Abnormal flow related signal in the left transverse sinus, sigmoid sinus and internal jugular vein may indicate slow flow or occlusion. Electronically Signed   By: Ulyses Jarred M.D.   On: 11/17/2019 01:44   EEG adult  Result Date: 11/28/2019 Lora Havens, MD     11/28/2019 11:35 AM Patient Name: Makayla Fisher MRN: [id number] Epilepsy Attending: Lora Havens Referring Physician/Provider: Dr. Antony Contras Date: 11/28/2019 Duration: 24.45 minutes Patient history: 85 year old female initially presented with altered mental status, right-sided weakness and abnormal speech.  MRI brain did not show any evidence of stroke.  EEG to evaluate for seizures. Level of alertness: Awake AEDs during EEG study: None Technical aspects: This EEG study was done with scalp electrodes positioned according to the 10-20 International system of electrode placement. Electrical activity was acquired at a sampling rate of 500Hz  and reviewed with a high frequency filter of 70Hz  and a low frequency filter of 1Hz . EEG data were recorded  continuously and digitally stored. Description: The posterior dominant rhythm consists of 9 Hz activity of moderate voltage (25-35 uV) seen predominantly in posterior head regions, symmetric and reactive to eye opening and eye closing. EEG showed intermittent left temporal 2-3Hz  delta slowing.  Hyperventilation and photic stimulation were not performed.   ABNORMALITY -Intermittent slow, left temporal region IMPRESSION: This study is suggestive of nonspecific cortical dysfunction left temporal region. No seizures or epileptiform discharges were seen throughout the recording. Lora Havens   ECHOCARDIOGRAM COMPLETE  Result Date: 11/27/2019    ECHOCARDIOGRAM REPORT   Patient Name:   Makayla Fisher Date of Exam: 11/27/2019 Medical Rec #:  [id number]  Height:       62.0 in Accession #:    [id number]       Weight:       187.4 lb Date of Birth:  11-21-1922       BSA:          1.860 m Patient Age:    10 years         BP:           157/70 mmHg Patient Gender: F                HR:           70 bpm. Exam Location:  Inpatient Procedure: 2D Echo Indications:    stroke 434.91  History:        Patient has prior history of Echocardiogram examinations, most                 recent 05/01/2018. Prior CABG; Risk Factors:Hypertension and                 Dyslipidemia.  Sonographer:    Johny Chess Referring Phys: [id number] R AROOR  Sonographer Comments: Image acquisition challenging due to uncooperative patient. IMPRESSIONS  1. Left ventricular ejection fraction, by estimation, is 60 to 65%. The left ventricle has normal function. The left ventricle has no regional wall motion abnormalities. There is mild left ventricular hypertrophy of the basal-septal segment. Left ventricular diastolic parameters are consistent with Grade II diastolic dysfunction (pseudonormalization). Elevated left atrial pressure.  2. Right ventricular systolic function is normal. The right ventricular size is normal. There is mildly elevated  pulmonary artery systolic pressure.  3. Left atrial size was moderately dilated.  4. The mitral valve is degenerative. Mild mitral valve regurgitation. No evidence of mitral stenosis.  5. The aortic valve is tricuspid. Aortic valve regurgitation is not visualized. Mild aortic valve sclerosis is present, with no evidence of aortic valve stenosis. FINDINGS  Left Ventricle: Left ventricular ejection fraction, by estimation, is 60 to 65%. The left ventricle has normal function. The left ventricle has no regional wall motion abnormalities. The left ventricular internal cavity size was normal in size. There is  mild left ventricular hypertrophy of the basal-septal segment. Left ventricular diastolic parameters are consistent with Grade II diastolic dysfunction (pseudonormalization). Elevated left atrial pressure. Right Ventricle: The right ventricular size is normal. No increase in right ventricular wall thickness. Right ventricular systolic function is normal. There is mildly elevated pulmonary artery systolic pressure. The tricuspid regurgitant velocity is 2.85  m/s, and with an assumed right atrial pressure of 3 mmHg, the estimated right ventricular systolic pressure is 08.6 mmHg. Left Atrium: Left atrial size was moderately dilated. Right Atrium: Right atrial size was normal in size. Pericardium: There is no evidence of pericardial effusion. Mitral Valve: The mitral valve is degenerative in appearance. There is moderate thickening of the mitral valve leaflet(s). There is mild calcification of the mitral valve leaflet(s). Mild to moderate mitral annular calcification. Mild mitral valve regurgitation. No evidence of mitral valve stenosis. Tricuspid Valve: The tricuspid valve is normal in structure. Tricuspid valve regurgitation is trivial. Aortic Valve: The aortic valve is tricuspid. Aortic valve regurgitation is not visualized. Mild aortic valve sclerosis is present, with no evidence of aortic valve stenosis. Pulmonic  Valve: The pulmonic valve was not well visualized. Pulmonic valve regurgitation is not visualized. Aorta: The aortic root is normal in size and structure. IAS/Shunts: No atrial level shunt detected by color flow Doppler.  LEFT  VENTRICLE PLAX 2D LVIDd:         5.00 cm  Diastology LVIDs:         2.90 cm  LV e' lateral:   10.00 cm/s LV PW:         1.20 cm  LV E/e' lateral: 13.5 LV IVS:        0.90 cm  LV e' medial:    6.31 cm/s LVOT diam:     1.70 cm  LV E/e' medial:  21.4 LV SV:         50 LV SV Index:   27 LVOT Area:     2.27 cm  RIGHT VENTRICLE             IVC RV S prime:     12.80 cm/s  IVC diam: 1.60 cm TAPSE (M-mode): 1.8 cm LEFT ATRIUM             Index       RIGHT ATRIUM           Index LA diam:        4.50 cm 2.42 cm/m  RA Area:     11.60 cm LA Vol (A2C):   63.4 ml 34.10 ml/m RA Volume:   22.10 ml  11.88 ml/m LA Vol (A4C):   60.4 ml 32.48 ml/m LA Biplane Vol: 62.6 ml 33.66 ml/m  AORTIC VALVE LVOT Vmax:   117.00 cm/s LVOT Vmean:  70.800 cm/s LVOT VTI:    0.221 m  AORTA Ao Root diam: 2.60 cm Ao Asc diam:  3.10 cm MITRAL VALVE                TRICUSPID VALVE MV Area (PHT): 2.80 cm     TR Peak grad:   32.5 mmHg MV Decel Time: 271 msec     TR Vmax:        285.00 cm/s MV E velocity: 135.00 cm/s MV A velocity: 127.00 cm/s  SHUNTS MV E/A ratio:  1.06         Systemic VTI:  0.22 m                             Systemic Diam: 1.70 cm Dani Gobble Croitoru MD Electronically signed by Sanda Klein MD Signature Date/Time: 11/27/2019/3:50:10 PM    Final    CT HEAD CODE STROKE WO CONTRAST  Result Date: 11/27/2019 CLINICAL DATA:  Code stroke.  Aphasia and altered mental status EXAM: CT HEAD WITHOUT CONTRAST TECHNIQUE: Contiguous axial images were obtained from the base of the skull through the vertex without intravenous contrast. COMPARISON:  None. FINDINGS: Brain: There is no mass, hemorrhage or extra-axial collection. There is generalized atrophy without lobar predilection. There is hypoattenuation of the periventricular  white matter, most commonly indicating chronic ischemic microangiopathy. Vascular: No abnormal hyperdensity of the major intracranial arteries or dural venous sinuses. No intracranial atherosclerosis. Skull: The visualized skull base, calvarium and extracranial soft tissues are normal. Sinuses/Orbits: No fluid levels or advanced mucosal thickening of the visualized paranasal sinuses. No mastoid or middle ear effusion. The orbits are normal. ASPECTS [facility] Stroke Program Early CT Score) - Ganglionic level infarction (caudate, lentiform nuclei, internal capsule, insula, M1-M3 cortex): 7 - Supraganglionic infarction (M4-M6 cortex): 3 Total score (0-10 with 10 being normal): 10 IMPRESSION: 1. No acute intracranial abnormality. 2. ASPECTS is 10. These results were communicated to Dr. Karena Addison Aroor at 2:16 am on 11/27/2019 by text page via the [employer] messaging  system. Electronically Signed   By: Ulyses Jarred M.D.   On: 11/27/2019 02:16   CT ANGIO HEAD CODE STROKE  Result Date: 11/27/2019 CLINICAL DATA:  Aphasia and encephalopathy EXAM: CT ANGIOGRAPHY HEAD AND NECK TECHNIQUE: Multidetector CT imaging of the head and neck was performed using the standard protocol during bolus administration of intravenous contrast. Multiplanar CT image reconstructions and MIPs were obtained to evaluate the vascular anatomy. Carotid stenosis measurements (when applicable) are obtained utilizing NASCET criteria, using the distal internal carotid diameter as the denominator. CONTRAST:  65mL OMNIPAQUE IOHEXOL 350 MG/ML SOLN COMPARISON:  None. FINDINGS: CTA NECK FINDINGS SKELETON: There is no bony spinal canal stenosis. No lytic or blastic lesion. OTHER NECK: Normal pharynx, larynx and major salivary glands. No cervical lymphadenopathy. Unremarkable thyroid gland. UPPER CHEST: Emphysema AORTIC ARCH: There is calcific atherosclerosis of the aortic arch. There is no aneurysm, dissection or hemodynamically significant stenosis of the visualized  portion of the aorta. Conventional 3 vessel aortic branching pattern. The visualized proximal subclavian arteries are widely patent. RIGHT CAROTID SYSTEM: No dissection, occlusion or aneurysm. There is calcified atherosclerosis extending into the proximal ICA, resulting in less than 50% stenosis. LEFT CAROTID SYSTEM: No dissection, occlusion or aneurysm. There is calcified atherosclerosis extending into the proximal ICA, resulting in less than 50% stenosis. VERTEBRAL ARTERIES: Codominant configuration. Both origins are clearly patent. There is no dissection, occlusion or flow-limiting stenosis to the skull base (V1-V3 segments). CTA HEAD FINDINGS POSTERIOR CIRCULATION: --Vertebral arteries: Normal V4 segments. --Inferior cerebellar arteries: Normal. --Basilar artery: Normal. --Superior cerebellar arteries: Normal. --Posterior cerebral arteries (PCA): Normal. ANTERIOR CIRCULATION: --Intracranial internal carotid arteries: Normal. --Anterior cerebral arteries (ACA): Normal. Both A1 segments are present. Patent anterior communicating artery (a-comm). --Middle cerebral arteries (MCA): Normal. VENOUS SINUSES: As permitted by contrast timing, patent. ANATOMIC VARIANTS: None Review of the MIP images confirms the above findings. IMPRESSION: 1. No emergent large vessel occlusion or high-grade stenosis of the intracranial arteries. 2. Bilateral carotid bifurcation atherosclerosis with less than 50% stenosis by NASCET criteria. 3. Aortic Atherosclerosis (ICD10-I70.0) and Emphysema (ICD10-J43.9). Electronically Signed   By: Ulyses Jarred M.D.   On: 11/27/2019 02:49      HISTORY OF PRESENT ILLNESS Jasmane Younan is a 86 y.o. female with past medical history significant for non-Hodgkin's lymphoma, MI, iron deficiency anemia presents to the emergency department as a code stroke for sudden onset altered mental status, right-sided weakness and abnormal speech. Last known well was 11 PM on 11/26/2019 witnessed by nursing staff in  her assisted living facility.  When they went to check on her again at 1 AM patient was not speaking and EMS was alerted on assessment patient was weaker on the right compared to the left, and would repeat she is scared. Blood pressure was 960 systolic.  On arrival to [facility], ER-initial NIH stroke scale was 9. Stat CT head was obtained which showed no hemorrhage and patient received IV TPA after discussion with patient's niece.  CTA was negative for large vessel occlusion. NIHSS: 9. Baseline MRS 3. IV tPA was administered. Slight delay for TPA was due to blood pressure control requiring clevidipine.     HOSPITAL COURSE Ms. Talulah Schirmer is a 86 y.o. female with history of non-Hodgkin's lymphoma, MI, iron deficiency anemia presenting with altered mental status, right-sided weakness and abnormal speech. Received tPA 11/27/2019 at 0231.   Stroke-like symptoms s/p IV tPA L brain TIA s/p tPA, possible recurrent amyloid angiopathy spells d/t small vessel disease Code Stroke CT head  No acute abnormality. ASPECTS 10.    CTA head & neck no LVO. B ICA stenosis < 50%. Aortic atherosclerosis. Emphysema.  MRI  no acute infarct   2D Echo EF 60-65%. No source of embolus. Moderate LA dilation EEG intermittent L temporal lobe slowing. No sz  LDL 136 HgbA1c 5.4 VTE prophylaxis - SCDs  aspirin 81 mg daily prior to admission, resume aspirin 81. Add plavix x 3 weeks then plavix alone.  Therapy recommendations:  [place] SLP. HH PT, HH OT  Disposition:  return to ALF    Hypertension Home meds:  Lisinopril 20 Stable Treated w/ cleviprex post tPA, now off BP goal normotensive   Hyperlipidemia Home meds:  No statin Added low dose lipitor 10 given advanced age LDL 136, goal < 70 Continue statin at discharge   Other Stroke Risk Factors Advanced age Former Cigarette smoker Obesity, Body mass index is 34.27 kg/m., recommend weight loss, diet and exercise as appropriate  Hx TIA 04/2017 - R brain TIA - left  hand, arm and face numbness that resolved after 15 minutes. Coronary artery disease s/p MI, CABG, stent Migraines Hx CHF, no present now    Other Active Problems Hx non-Hodgkin's lymphoma chemo 2015, on maintenance retuxin iron deficiency anemia GERD   Has colostomy   DISCHARGE EXAM Blood pressure (!) 141/60, pulse (!) 58, temperature 98.5 F (36.9 C), temperature source Oral, resp. rate 17, weight 85 kg, SpO2 94 %. Pleasant elderly Caucasian lady not in distress.  She is extremely hard of hearing. . Afebrile. Head is nontraumatic. Neck is supple without bruit.    Cardiac exam no murmur or gallop. Lungs are clear to auscultation. Distal pulses are well felt. Neurological Exam : She is awake alert oriented to place and person but not to time.  Speech is clear without dysarthria or aphasia.  She is extremely hard of hearing and has difficult time following commands but follows simple midline and one-step commands.  Extraocular movements are full range without nystagmus.  She blinks to threat bilaterally.  Face appears symmetric.  Tongue midline motor system exam she is able to move all 4 extremities well against gravity but is not very cooperative for detailed muscle testing on the right.  Sensation appears preserved bilaterally.  Both plantars are downgoing.  Gait not tested.  Discharge Diet   Regular thin liquids  DISCHARGE PLAN Disposition:  Return to ALF Home health PT, OT, SLP aspirin 81 mg daily and clopidogrel 75 mg daily for secondary stroke prevention with DAPT for 3 weeks then plavix alone. Ongoing stroke risk factor control by Primary Care Physician at time of discharge Follow-up PCP [name], DO in 2 weeks. Follow-up in [facility] in 4 weeks, office to schedule an appointment.   32 minutes were spent preparing discharge.  Burnetta Sabin, MSN, APRN, ANVP-BC, AGPCNP-BC Advanced Practice Stroke Nurse [place] for  Schedule & Pager information 11/28/2019 3:22 PM   I have personally obtained history,examined this patient, reviewed notes, independently viewed imaging studies, participated in medical decision making and plan of care.ROS completed by me personally and pertinent positives fully documented  I have made any additions or clarifications directly to the above note. Agree with note above.    Antony Contras, MD Medical Director [facility] Pager: [phone] 11/28/2019 3:44 PM

## 2019-11-28 NOTE — Evaluation (Signed)
Occupational Therapy Evaluation Patient Details Name: Makayla Fisher MRN: [id number] DOB: 1923/03/19 Today's Date: 11/28/2019    History of Present Illness 86 y.o. female with past medical history significant for non-Hodgkin's lymphoma, MI, iron deficiency anemia presents to the emergency department as a code stroke on 8/4 for sudden onset altered mental status, right-sided weakness and abnormal speech. TPA administered, MRI negative for acute findings. Possible TIA vs seizure activity, awaiting EEG.   Clinical Impression   Pt admitted with above. She demonstrates the below listed deficits and will benefit from continued OT to maximize safety and independence with BADLs.  Pt presents to OT with generalized weakness, impaired balance, decreased activity tolerance.  She currently requires min guard assist to min A.  She lives at an ALF, and reports she was mod I with ADLs, and ambulates to the dining room 2x/day for meals.  She needs to be at baseline in order to return to ALF.  Based on performance today, anticipate she will be able to return to ALF, but if progress not as expected, she may need SNF level rehab.  Will follow.       Follow Up Recommendations  Home health OT;Supervision - Intermittent    Equipment Recommendations  None recommended by OT    Recommendations for Other Services       Precautions / Restrictions Precautions Precautions: Fall Restrictions Weight Bearing Restrictions: No      Mobility Bed Mobility Overal bed mobility: Needs Assistance Bed Mobility: Sit to Supine     Supine to sit: Min assist;HOB elevated Sit to supine: Min assist   General bed mobility comments: assist to lower trunk to bed and assist to lift feet onto bed   Transfers Overall transfer level: Needs assistance Equipment used: Rolling walker (2 wheeled) Transfers: Sit to/from [facility] Sit to Stand: Min guard Stand pivot transfers: Min guard       General  transfer comment: min guard for safety     Balance Overall balance assessment: Needs assistance Sitting-balance support: Feet unsupported;Single extremity supported Sitting balance-Leahy Scale: Good Sitting balance - Comments: able to access feet    Standing balance support: Single extremity supported;During functional activity Standing balance-Leahy Scale: Poor Standing balance comment: reliant on UE support                            ADL either performed or assessed with clinical judgement   ADL Overall ADL's : Needs assistance/impaired Eating/Feeding: Modified independent;Sitting;Bed level   Grooming: Wash/dry hands;Wash/dry face;Oral care;Brushing hair;Min guard;Standing   Upper Body Bathing: Set up;Sitting   Lower Body Bathing: Minimal assistance;Sit to/from stand   Upper Body Dressing : Set up;Sitting   Lower Body Dressing: Minimal assistance;Sit to/from stand   Toilet Transfer: Min guard;Ambulation;Comfort height toilet;RW   Toileting- Clothing Manipulation and Hygiene: Minimal assistance;Sit to/from stand       Functional mobility during ADLs: Minimal assistance;Rolling walker General ADL Comments: difficulty accessing feet      Vision Baseline Vision/History: Wears glasses;Macular Degeneration Wears Glasses: At all times Patient Visual Report: No change from baseline Vision Assessment?: Yes Eye Alignment: Within Functional Limits Ocular Range of Motion: Within Functional Limits Alignment/Gaze Preference: Within Defined Limits Tracking/Visual Pursuits: Able to track stimulus in all quads without difficulty Visual Fields: No apparent deficits     Perception Perception Perception Tested?: Yes   Praxis Praxis Praxis tested?: Within functional limits    Pertinent Vitals/Pain Pain Assessment: No/denies pain  Hand Dominance Right   Extremity/Trunk Assessment Upper Extremity Assessment Upper Extremity Assessment: Generalized weakness    Lower Extremity Assessment Lower Extremity Assessment: Generalized weakness   Cervical / Trunk Assessment Cervical / Trunk Assessment: Kyphotic   Communication Communication Communication: HOH   Cognition Arousal/Alertness: Awake/alert Behavior During Therapy: WFL for tasks assessed/performed Overall Cognitive Status: Difficult to assess                                 General Comments: Pt follows all commands with cues due to [facility].  She was able to perform simple path finding without cues    General Comments  VSS     Exercises     Shoulder Instructions      Home Living Family/patient expects to be discharged to:: Assisted living     Type of Home: Assisted living                       Home Equipment: Walker - 4 wheels;Grab bars - toilet;Grab bars - tub/shower;Shower seat          Prior Functioning/Environment Level of Independence: Independent with assistive device(s);Needs assistance  Gait / Transfers Assistance Needed: pt walks with rollator, ambulates to and from dining room without assist. ADL's / Homemaking Assistance Needed: Pt reports independence with dressing and bathing PTA, states all meals are prepared for her and she takes her meals in dining room.   Comments: Pt reports 1 fall 5 years ago, no falls since. Pt enjoys doing art classes and ALF events, sitting on porch at ALF.        OT Problem List: Decreased strength;Decreased activity tolerance;Impaired balance (sitting and/or standing);Decreased safety awareness;Decreased knowledge of use of DME or AE      OT Treatment/Interventions: Self-care/ADL training;DME and/or AE instruction;Therapeutic activities;Patient/family education;Balance training    OT Goals(Current goals can be found in the care plan section) Acute Rehab OT Goals Patient Stated Goal: to get back home  OT Goal Formulation: With patient Time For Goal Achievement: 12/12/19 Potential to Achieve Goals: Good ADL  Goals Pt Will Perform Grooming: with supervision;standing Pt Will Perform Upper Body Bathing: with set-up;sitting Pt Will Perform Lower Body Bathing: with supervision;sit to/from stand Pt Will Perform Upper Body Dressing: with supervision;sitting Pt Will Perform Lower Body Dressing: with supervision;sit to/from stand Pt Will Transfer to Toilet: with supervision;ambulating;stand pivot transfer;grab bars Pt Will Perform Toileting - Clothing Manipulation and hygiene: with supervision;sit to/from stand Pt Will Perform Tub/Shower Transfer: Shower transfer;with supervision;ambulating;shower seat;rolling walker  OT Frequency: Min 2X/week   Barriers to D/C: Decreased caregiver support          Co-evaluation              AM-PAC OT "6 Clicks" Daily Activity     Outcome Measure Help from another person eating meals?: None Help from another person taking care of personal grooming?: A Little Help from another person toileting, which includes using toliet, bedpan, or urinal?: A Little Help from another person bathing (including washing, rinsing, drying)?: A Little Help from another person to put on and taking off regular upper body clothing?: A Little Help from another person to put on and taking off regular lower body clothing?: A Little 6 Click Score: 19   End of Session Equipment Utilized During Treatment: Rolling walker Nurse Communication: Mobility status  Activity Tolerance: Patient tolerated treatment well Patient left: in bed;with call bell/phone within  reach;with bed alarm set;with family/visitor present  OT Visit Diagnosis: Unsteadiness on feet (R26.81)                Time: 2258-3462 OT Time Calculation (min): 30 min Charges:  OT General Charges $OT Visit: 1 Visit OT Evaluation $OT Eval Moderate Complexity: 1 Mod OT Treatments $Self Care/Home Management : 8-22 mins  Nilsa Nutting., OTR/L Acute Rehabilitation Services Pager [phone] Office [place],  [place] 11/28/2019, 12:45 PM

## 2019-11-29 NOTE — Progress Notes (Signed)
[facility] arrived to transport C. Trippett to [facility]. L Hand IV removed and all belongings accounted for. Patient stated she remembered giving her 2 rings to her nephew, Tommie Raymond.   Called [facility] to update on patient's arrival. They stated that Dottie received report earlier in the day. I reinstated that the patient would have an outpatient follow up visit with neurology and they started her on Plavix. Facility did not have any questions. AVS included in discharge paperwork given to [place].

## 2019-12-06 ENCOUNTER — Other Ambulatory Visit: Payer: Self-pay

## 2019-12-06 ENCOUNTER — Other Ambulatory Visit: Payer: Self-pay | Admitting: Family

## 2019-12-06 ENCOUNTER — Inpatient Hospital Stay: Payer: Medicare Other | Attending: Hematology & Oncology

## 2019-12-06 ENCOUNTER — Ambulatory Visit: Payer: Medicare Other

## 2019-12-06 VITALS — BP 164/72 | HR 67 | Temp 98.7°F | Resp 20

## 2019-12-06 DIAGNOSIS — Z8572 Personal history of non-Hodgkin lymphomas: Secondary | ICD-10-CM | POA: Diagnosis present

## 2019-12-06 DIAGNOSIS — D508 Other iron deficiency anemias: Secondary | ICD-10-CM | POA: Insufficient documentation

## 2019-12-06 DIAGNOSIS — K9403 Colostomy malfunction: Secondary | ICD-10-CM

## 2019-12-06 DIAGNOSIS — C8253 Diffuse follicle center lymphoma, intra-abdominal lymph nodes: Secondary | ICD-10-CM

## 2019-12-06 DIAGNOSIS — K56699 Other intestinal obstruction unspecified as to partial versus complete obstruction: Secondary | ICD-10-CM

## 2019-12-06 MED ORDER — SODIUM CHLORIDE 0.9 % IJ SOLN
10.0000 mL | INTRAMUSCULAR | Status: DC | PRN
  Filled 2019-12-06: qty 10

## 2019-12-06 MED ORDER — SODIUM CHLORIDE 0.9 % IV SOLN
510.0000 mg | Freq: Once | INTRAVENOUS | Status: AC
  Administered 2019-12-06: 510 mg via INTRAVENOUS
  Filled 2019-12-06: qty 17

## 2019-12-06 MED ORDER — SODIUM CHLORIDE 0.9 % IV SOLN
Freq: Once | INTRAVENOUS | Status: AC
  Filled 2019-12-06: qty 250

## 2019-12-06 MED ORDER — HEPARIN SOD (PORK) LOCK FLUSH 100 UNIT/ML IV SOLN
500.0000 [IU] | Freq: Once | INTRAVENOUS | Status: DC
  Filled 2019-12-06: qty 5

## 2019-12-06 MED ORDER — HEPARIN SOD (PORK) LOCK FLUSH 100 UNIT/ML IV SOLN
500.0000 [IU] | Freq: Once | INTRAVENOUS | Status: AC | PRN
  Administered 2019-12-06: 500 [IU]
  Filled 2019-12-06: qty 5

## 2019-12-06 MED ORDER — SODIUM CHLORIDE 0.9% FLUSH
10.0000 mL | INTRAVENOUS | Status: DC | PRN
  Administered 2019-12-06: 10 mL via INTRAVENOUS
  Filled 2019-12-06: qty 10

## 2019-12-16 ENCOUNTER — Inpatient Hospital Stay: Payer: Medicare Other

## 2019-12-16 ENCOUNTER — Ambulatory Visit: Payer: Medicare Other | Admitting: Family

## 2019-12-16 ENCOUNTER — Other Ambulatory Visit: Payer: Medicare Other | Admitting: Family

## 2019-12-16 ENCOUNTER — Inpatient Hospital Stay: Payer: Medicare Other | Admitting: Family

## 2019-12-16 ENCOUNTER — Other Ambulatory Visit: Payer: Medicare Other

## 2019-12-16 VITALS — BP 173/78 | HR 63 | Temp 97.8°F | Resp 18

## 2019-12-16 DIAGNOSIS — D508 Other iron deficiency anemias: Secondary | ICD-10-CM | POA: Diagnosis not present

## 2019-12-16 DIAGNOSIS — Z95828 Presence of other vascular implants and grafts: Secondary | ICD-10-CM

## 2019-12-16 DIAGNOSIS — C8253 Diffuse follicle center lymphoma, intra-abdominal lymph nodes: Secondary | ICD-10-CM

## 2019-12-16 DIAGNOSIS — K56699 Other intestinal obstruction unspecified as to partial versus complete obstruction: Secondary | ICD-10-CM

## 2019-12-16 DIAGNOSIS — K9403 Colostomy malfunction: Secondary | ICD-10-CM

## 2019-12-16 MED ORDER — SODIUM CHLORIDE 0.9 % IV SOLN
510.0000 mg | Freq: Once | INTRAVENOUS | Status: AC
  Administered 2019-12-16: 510 mg via INTRAVENOUS
  Filled 2019-12-16: qty 510

## 2019-12-16 MED ORDER — SODIUM CHLORIDE 0.9 % IJ SOLN
10.0000 mL | INTRAMUSCULAR | Status: DC | PRN
  Administered 2019-12-16: 10 mL
  Filled 2019-12-16: qty 10

## 2019-12-16 MED ORDER — HEPARIN SOD (PORK) LOCK FLUSH 100 UNIT/ML IV SOLN
500.0000 [IU] | Freq: Once | INTRAVENOUS | Status: AC
  Administered 2019-12-16: 500 [IU] via INTRAVENOUS
  Filled 2019-12-16: qty 5

## 2019-12-16 MED ORDER — SODIUM CHLORIDE 0.9 % IV SOLN
Freq: Once | INTRAVENOUS | Status: AC
  Filled 2019-12-16: qty 250

## 2019-12-16 NOTE — Patient Instructions (Signed)

## 2019-12-24 ENCOUNTER — Inpatient Hospital Stay: Payer: Medicare Other | Admitting: Adult Health

## 2019-12-26 ENCOUNTER — Other Ambulatory Visit: Payer: Self-pay

## 2019-12-26 ENCOUNTER — Ambulatory Visit (INDEPENDENT_AMBULATORY_CARE_PROVIDER_SITE_OTHER): Payer: Medicare Other | Admitting: Adult Health

## 2019-12-26 ENCOUNTER — Encounter: Payer: Self-pay | Admitting: Adult Health

## 2019-12-26 VITALS — BP 161/75 | HR 75 | Ht 60.0 in | Wt 175.0 lb

## 2019-12-26 DIAGNOSIS — E785 Hyperlipidemia, unspecified: Secondary | ICD-10-CM

## 2019-12-26 DIAGNOSIS — I10 Essential (primary) hypertension: Secondary | ICD-10-CM

## 2019-12-26 DIAGNOSIS — R2990 Unspecified symptoms and signs involving the nervous system: Secondary | ICD-10-CM | POA: Diagnosis not present

## 2019-12-26 NOTE — Progress Notes (Signed)
I agree with the above plan 

## 2019-12-26 NOTE — Progress Notes (Signed)
[facility] [address] [place]. [place] [address] [phone]       HOSPITAL FOLLOW UP NOTE  [name] Fisher Date of Birth:  10-12-1922 Medical Record Number:  [id number]   Reason for Referral:  hospital stroke follow up    SUBJECTIVE:   CHIEF COMPLAINT:  Chief Complaint  Patient presents with  . Hospitalization Follow-up    rm 4  . Transient Ischemic Attack    Pt said the Dr, said she did not have a stroke.    HPI:   MakaylaMakayla Thorntonis a 86 y.o.femalewith history of non-Hodgkin's lymphoma, MI, iron deficiency anemiawho presented on 11/27/2019 with altered mental status, right-sided weakness and abnormal speech. Received IV TPA with NIHSS 9.  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  EEG showed intermittent left temporal slowing without evidence of seizure.  Recommended DAPT for 3 weeks then Plavix alone as previously on aspirin. Hx of HTN stable and resume lisinopril 20 mg daily.  LDL 136 and initiate atorvastatin 10 mg daily (low-dose due to advanced age).  No evidence or history of DM with A1c 5.4.  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  Other active problems include history of non-Hodgkin's lymphoma s/p chemo 2015 on maintenance retuxin, iron deficiency anemia, GERD and colostomy.  Evaluated by therapy and recommended discharge back to ALF with home health PT/OT/SLP.  Stroke-like symptoms s/p IV tPA L brainTIA s/p tPA, possible recurrent amyloid angiopathy spells d/t small vessel disease  Code Stroke CT head No acute abnormality. ASPECTS 10.   CTA head & neckno LVO. B ICA stenosis < 50%. Aortic atherosclerosis. Emphysema.  MRIno acute infarct    2D EchoEF 60-65%. No source of embolus. Moderate LA  dilation  EEG intermittent L temporal lobe slowing. No sz   LDL136  HgbA1c5.4  VTE prophylaxis -SCDs  aspirin 81 mg dailyprior to admission, resume aspirin 81. Add plavix x 3 weeks then plavix alone.   Therapy recommendations:HH SLP. HH PT, [place] OT   Disposition:return to ALF   Today, 12/26/2019, Makayla Fisher is being seen for hospital follow-up unaccompanied She continues to reside at [facility]  She has been doing well since discharge without residual deficits and denies new or reoccurring stroke/TIA symptoms She does report increased anxiety due to fear of reoccurring stroke.  PCP recently restarted sertraline as she does have underlying history of anxiety She has been working with PT/OT at facility working on strengthening and balance Ambulates with rollator walker which she used PTA (since 2009 post heart surgery)  Completed 3 weeks DAPT and remains on Plavix alone without bleeding or bruising Continues on atorvastatin 10 mg daily without myalgias  blood pressure today elevated.  Reports blood pressure typically elevated at appointments, occasionally monitors at facility which has been stable  She does report increased fatigue and lack of energy but has recently received iron transfusions for known anemia as well as being treated for UTI  No further concerns at this time      ROS:   14 system review of systems performed and negative with exception of fatigue and anxiety  PMH:  Past Medical History:  Diagnosis Date  . GERD (gastroesophageal reflux disease)   . Heart attack ([place]) 2009  . History of blood transfusion 2009  .  Iron deficiency anemia due to chronic blood loss 08/07/2014  . NHL (non-Hodgkin's lymphoma) ([place]) 10/18/2013   finished chemo dec 2015, maintenanance retuxin    PSH:  Past Surgical History:  Procedure Laterality Date  . c section  1947, 1963  . CARDIAC SURGERY  2009  . CHOLECYSTECTOMY  1973  . COLON SURGERY  2014   Colostomy   . COLOSTOMY REVISION N/A 06/12/2014   Procedure: COLOSTOMY REVISION;  Surgeon: [name], MD;  Location: [facility];  Service: General;  Laterality: N/A;  . CORONARY ANGIOPLASTY WITH STENT PLACEMENT  2009  . CORONARY ARTERY BYPASS GRAFT  aug 2009    x3  . ESOPHAGOGASTRODUODENOSCOPY  2013  . EYE SURGERY Bilateral  10 years ago   lens replacments cataracts  . right chest pac  june 2015    Social History:  Social History   Socioeconomic History  . Marital status: Widowed    Spouse name: Not on file  . Number of children: Not on file  . Years of education: Not on file  . Highest education level: Not on file  Occupational History  . Not on file  Tobacco Use  . Smoking status: Former Smoker    Packs/day: 1.00    Years: 25.00    Pack years: 25.00    Types: Cigarettes    Start date: 05/16/1948    Quit date: 05/17/1963    Years since quitting: 56.6  . Smokeless tobacco: Never Used  . Tobacco comment: quit smoking 50 years ago  Vaping Use  . Vaping Use: Never used  Substance and Sexual Activity  . Alcohol use: No    Alcohol/week: 0.0 standard drinks  . Drug use: No  . Sexual activity: Not on file  Other Topics Concern  . Not on file  Social History Narrative  . Not on file   Social Determinants of Health   Financial Resource Strain:   . Difficulty of Paying Living Expenses: Not on file  Food Insecurity:   . Worried About Charity fundraiser in the Last Year: Not on file  . Ran Out of Food in the Last Year: Not on file  Transportation Needs:   . Lack of Transportation (Medical): Not on file  . Lack of Transportation (Non-Medical): Not on file  Physical Activity:   . Days of Exercise per Week: Not on file  . Minutes of Exercise per Session: Not on file  Stress:   . Feeling of Stress : Not on file  Social Connections:   . Frequency of Communication with Friends and Family: Not on file  . Frequency of Social Gatherings with Friends and Family: Not on file  . Attends  Religious Services: Not on file  . Active Member of Clubs or Organizations: Not on file  . Attends Archivist Meetings: Not on file  . Marital Status: Not on file  Intimate Partner Violence:   . Fear of Current or Ex-Partner: Not on file  . Emotionally Abused: Not on file  . Physically Abused: Not on file  . Sexually Abused: Not on file    Family History:  Family History  Problem Relation Age of Onset  . Diverticulitis Mother   . Heart attack Mother   . Ulcers Father     Medications:   Current Outpatient Medications on File Prior to Visit  Medication Sig Dispense Refill  . atorvastatin (LIPITOR) 10 MG tablet Take 1 tablet (10 mg total) by mouth daily. 30 tablet 2  .  Calcium Carb-Cholecalciferol (CALCIUM 600+D3 PO) Take 1 tablet by mouth at bedtime.    . Cholecalciferol (VITAMIN D3 SUPER STRENGTH) 50 MCG (2000 UT) CAPS Take 4,000 Units by mouth daily.     . clopidogrel (PLAVIX) 75 MG tablet Take 1 tablet (75 mg total) by mouth daily. 30 tablet 2  . Cranberry 425 MG CAPS Take 425 mg by mouth daily.     Marland Kitchen levothyroxine (SYNTHROID, LEVOTHROID) 50 MCG tablet Take 50 mcg by mouth daily before breakfast.     . lidocaine-prilocaine (EMLA) cream Apply 1 application topically See admin instructions. Apply to port-a-cath one hour prior to procedure    . lisinopril (PRINIVIL,ZESTRIL) 20 MG tablet Take 1 tablet (20 mg total) by mouth daily. 30 tablet 3  . Multiple Vitamin (MULTIVITAMIN WITH MINERALS) TABS tablet Take 1 tablet by mouth daily at 12 noon.     . NEOMYCIN-POLYMYXIN-HYDROCORTISONE (CORTISPORIN) 1 % SOLN OTIC solution Apply 1-2 drops to toe BID after soaking (Patient taking differently: 1-2 drops See admin instructions. Apply 1-2 drops to affected toe 2 times a day after soaking) 10 mL 1  . nystatin cream (MYCOSTATIN) Apply 1 application topically every 12 (twelve) hours as needed (to rash between the legs and groin).    . psyllium (METAMUCIL) 58.6 % packet Take 1 packet by  mouth every Monday, Wednesday, and Friday. ORANGE    . rOPINIRole (REQUIP) 1 MG tablet Take 1 mg by mouth at bedtime.    . sertraline (ZOLOFT) 25 MG tablet Take 12.5 mg by mouth daily.     Marland Kitchen SPIRIVA HANDIHALER 18 MCG inhalation capsule Place 18 mcg into inhaler and inhale daily.     Carren Rang COMPLETE 0.6 % SOLN Place 1 drop into both eyes 3 (three) times daily.      No current facility-administered medications on file prior to visit.    Allergies:   Allergies  Allergen Reactions  . Epipen [Epinephrine Hcl (Nasal)] Palpitations  . Levaquin [Levofloxacin In D5w] Other (See Comments)    Weakness- "Allergic," per MAR  . Chlorhexidine Gluconate Other (See Comments)    Pt declined use and suspects she may have an intolerance  . Heparin Other (See Comments)    "Alleregic," pre MAR- Maybe affected platelets, per patient  . Sulfa Antibiotics Rash      OBJECTIVE:  Physical Exam  Vitals:   12/26/19 1414 12/26/19 1418  BP: (!) 170/77 (!) 161/75  Pulse: 73 75  Weight: 175 lb (79.4 kg)   Height: 5' (1.524 m)    Body mass index is 34.18 kg/m. No exam data present  No flowsheet data found.   General: well developed, well nourished,  pleasant elderly Caucasian female, seated, in no evident distress Head: head normocephalic and atraumatic.   Neck: supple with no carotid or supraclavicular bruits Cardiovascular: regular rate and rhythm, no murmurs Musculoskeletal: no deformity Skin:  no rash/petichiae Vascular:  Normal pulses all extremities   Neurologic Exam Mental Status: Awake and fully alert.   Fluent speech and language.  Oriented to place and time. Recent and remote memory intact. Attention span, concentration and fund of knowledge appropriate. Mood and affect appropriate.  Cranial Nerves: Fundoscopic exam reveals sharp disc margins. Pupils equal, briskly reactive to light. Extraocular movements full without nystagmus. Visual fields full to confrontation. Hearing intact. Facial  sensation intact. Face, tongue, palate moves normally and symmetrically.  Motor: Normal bulk and tone. Normal strength in all tested extremity muscles. Sensory.: intact to touch , pinprick , position and  vibratory sensation.  Coordination: Rapid alternating movements normal in all extremities. Finger-to-nose and heel-to-shin performed accurately bilaterally. Gait and Station: Arises from chair without difficulty. Stance is normal. Gait demonstrates normal stride length and balance with use of Rollator walker Reflexes: 1+ and symmetric. Toes downgoing.     NIHSS  0 Modified Rankin  0      ASSESSMENT: Makayla Fisher is a 86 y.o. year old female presented with altered mental status, right-sided weakness and abnormal speech on 11/27/2019 possibly strokelike symptoms vs TIA s/p tPA but possibly recurrent amyloid angiopathy spells due to small vessel disease. Vascular risk factors include HTN, HLD, advanced age, former tobacco use, obesity, history of right brain TIA 2019, CAP s/p MI, CABG and stent, migraines and history of CHF (not currently present).  Personally reviewed recent hospitalization pertinent progress notes, discharge summary, lab work and imaging.     PLAN:  1. Strokelike episode vs TIA s/p tPA:  a. Doing well without reoccurring or new symptoms.  Does report increased anxiety due to fear of reoccurring stroke and discussed that this should gradually improve as time goes on.  Recently restarted sertraline by PCP.  b.  Continue clopidogrel 75 mg daily  and atorvastatin 10 mg daily for secondary stroke prevention.  c. Close PCP follow up for aggressive stroke risk factor management  2. HTN:  a. BP goal <130/90.   b. Elevated today but typically stable.   c. Advised to monitor routinely at facility and to follow-up with PCP for further management 3. HLD:  a. LDL goal <70. Recent LDL 136.   b. Initiated atorvastatin 10 mg daily during recent admission.   c. Advised to continue  atorvastatin 10 mg daily.   d. Lipid panel and prescribing managed by PCP/facility     Follow up in 6 months or call earlier if needed   I spent 45 minutes of face-to-face and non-face-to-face time with patient.  This included previsit chart review, lab review, study review, order entry, electronic health record documentation, patient education regarding recent strokelike episode vs TIA s/p TPA, symptoms of anxiety post event importance of managing stroke risk factors and answered all questions to patient satisfaction   Frann Rider, AGNP-BC  [facility] [address] [place], [place] [address]  Phone [phone] Fax [phone] Note: This document was prepared with digital dictation and possible smart phrase technology. Any transcriptional errors that result from this process are unintentional.

## 2019-12-26 NOTE — Patient Instructions (Addendum)
Continue to work with therapy for ongoing improvement of balance and strength  Your anxiety right now is normal and should continue to improve as time goes on  Continue clopidogrel 75 mg daily  and atorvastatin 10 mg daily for secondary stroke prevention  Continue to follow up with PCP regarding cholesterol and blood pressure management  Maintain strict control of hypertension with blood pressure goal below 130/90 and cholesterol with LDL cholesterol (bad cholesterol) goal below 70 mg/dL.      Followup in the future with me in 6 months or call earlier if needed      Thank you for coming to see [place] at [facility]. I hope we have been able to provide you high quality care today.  You may receive a patient satisfaction survey over the next few weeks. We would appreciate your feedback and comments so that we may continue to improve ourselves and the health of our patients.     Stroke Prevention Some medical conditions and behaviors are associated with a higher chance of having a stroke. You can help prevent a stroke by making nutrition, lifestyle, and other changes, including managing any medical conditions you may have. What nutrition changes can be made?   Eat healthy foods. You can do this by: ? Choosing foods high in fiber, such as fresh fruits and vegetables and whole grains. ? Eating at least 5 or more servings of fruits and vegetables a day. Try to fill half of your plate at each meal with fruits and vegetables. ? Choosing lean protein foods, such as lean cuts of meat, poultry without skin, fish, tofu, beans, and nuts. ? Eating low-fat dairy products. ? Avoiding foods that are high in salt (sodium). This can help lower blood pressure. ? Avoiding foods that have saturated fat, trans fat, and cholesterol. This can help prevent high cholesterol. ? Avoiding processed and premade foods.  Follow your health care provider's specific guidelines for losing weight,  controlling high blood pressure (hypertension), lowering high cholesterol, and managing diabetes. These may include: ? Reducing your daily calorie intake. ? Limiting your daily sodium intake to 1,500 milligrams (mg). ? Using only healthy fats for cooking, such as olive oil, canola oil, or sunflower oil. ? Counting your daily carbohydrate intake. What lifestyle changes can be made?  Maintain a healthy weight. Talk to your health care provider about your ideal weight.  Get at least 30 minutes of moderate physical activity at least 5 days a week. Moderate activity includes brisk walking, biking, and swimming.  Do not use any products that contain nicotine or tobacco, such as cigarettes and e-cigarettes. If you need help quitting, ask your health care provider. It may also be helpful to avoid exposure to secondhand smoke.  Limit alcohol intake to no more than 1 drink a day for nonpregnant women and 2 drinks a day for men. One drink equals 12 oz of beer, 5 oz of wine, or 1 oz of hard liquor.  Stop any illegal drug use.  Avoid taking birth control pills. Talk to your health care provider about the risks of taking birth control pills if: ? You are over 65 years old. ? You smoke. ? You get migraines. ? You have ever had a blood clot. What other changes can be made?  Manage your cholesterol levels. ? Eating a healthy diet is important for preventing high cholesterol. If cholesterol cannot be managed through diet alone, you may also need to take medicines. ? Take any prescribed  medicines to control your cholesterol as told by your health care provider.  Manage your diabetes. ? Eating a healthy diet and exercising regularly are important parts of managing your blood sugar. If your blood sugar cannot be managed through diet and exercise, you may need to take medicines. ? Take any prescribed medicines to control your diabetes as told by your health care provider.  Control your hypertension. ? To  reduce your risk of stroke, try to keep your blood pressure below 130/80. ? Eating a healthy diet and exercising regularly are an important part of controlling your blood pressure. If your blood pressure cannot be managed through diet and exercise, you may need to take medicines. ? Take any prescribed medicines to control hypertension as told by your health care provider. ? Ask your health care provider if you should monitor your blood pressure at home. ? Have your blood pressure checked every year, even if your blood pressure is normal. Blood pressure increases with age and some medical conditions.  Get evaluated for sleep disorders (sleep apnea). Talk to your health care provider about getting a sleep evaluation if you snore a lot or have excessive sleepiness.  Take over-the-counter and prescription medicines only as told by your health care provider. Aspirin or blood thinners (antiplatelets or anticoagulants) may be recommended to reduce your risk of forming blood clots that can lead to stroke.  Make sure that any other medical conditions you have, such as atrial fibrillation or atherosclerosis, are managed. What are the warning signs of a stroke? The warning signs of a stroke can be easily remembered as BEFAST.  B is for balance. Signs include: ? Dizziness. ? Loss of balance or coordination. ? Sudden trouble walking.  E is for eyes. Signs include: ? A sudden change in vision. ? Trouble seeing.  F is for face. Signs include: ? Sudden weakness or numbness of the face. ? The face or eyelid drooping to one side.  A is for arms. Signs include: ? Sudden weakness or numbness of the arm, usually on one side of the body.  S is for speech. Signs include: ? Trouble speaking (aphasia). ? Trouble understanding.  T is for time. ? These symptoms may represent a serious problem that is an emergency. Do not wait to see if the symptoms will go away. Get medical help right away. Call your local  emergency services (911 in the [place] Do not drive yourself to the hospital.  Other signs of stroke may include: ? A sudden, severe headache with no known cause. ? Nausea or vomiting. ? Seizure. Where to find more information For more information, visit:  American Stroke Association: www.strokeassociation.org  National Stroke Association: www.stroke.org Summary  You can prevent a stroke by eating healthy, exercising, not smoking, limiting alcohol intake, and managing any medical conditions you may have.  Do not use any products that contain nicotine or tobacco, such as cigarettes and e-cigarettes. If you need help quitting, ask your health care provider. It may also be helpful to avoid exposure to secondhand smoke.  Remember BEFAST for warning signs of stroke. Get help right away if you or a loved one has any of these signs. This information is not intended to replace advice given to you by your health care provider. Make sure you discuss any questions you have with your health care provider. Document Revised: 03/24/2017 Document Reviewed: 05/17/2016 Elsevier Patient Education  2020 [employer].

## 2020-01-02 ENCOUNTER — Inpatient Hospital Stay: Payer: Medicare Other

## 2020-01-02 ENCOUNTER — Other Ambulatory Visit: Payer: Self-pay

## 2020-01-02 ENCOUNTER — Inpatient Hospital Stay: Payer: Medicare Other | Attending: Hematology & Oncology

## 2020-01-02 DIAGNOSIS — Z452 Encounter for adjustment and management of vascular access device: Secondary | ICD-10-CM | POA: Diagnosis not present

## 2020-01-02 DIAGNOSIS — K9403 Colostomy malfunction: Secondary | ICD-10-CM

## 2020-01-02 DIAGNOSIS — K56699 Other intestinal obstruction unspecified as to partial versus complete obstruction: Secondary | ICD-10-CM

## 2020-01-02 DIAGNOSIS — Z8572 Personal history of non-Hodgkin lymphomas: Secondary | ICD-10-CM | POA: Diagnosis present

## 2020-01-02 DIAGNOSIS — D508 Other iron deficiency anemias: Secondary | ICD-10-CM | POA: Diagnosis present

## 2020-01-02 DIAGNOSIS — C8253 Diffuse follicle center lymphoma, intra-abdominal lymph nodes: Secondary | ICD-10-CM

## 2020-01-02 DIAGNOSIS — Z95828 Presence of other vascular implants and grafts: Secondary | ICD-10-CM

## 2020-01-02 MED ORDER — SODIUM CHLORIDE 0.9 % IJ SOLN
3.0000 mL | Freq: Once | INTRAMUSCULAR | Status: DC | PRN
  Filled 2020-01-02: qty 3

## 2020-01-02 MED ORDER — HEPARIN SOD (PORK) LOCK FLUSH 100 UNIT/ML IV SOLN
250.0000 [IU] | Freq: Once | INTRAVENOUS | Status: AC | PRN
  Administered 2020-01-02: 250 [IU]
  Filled 2020-01-02: qty 5

## 2020-01-02 MED ORDER — SODIUM CHLORIDE 0.9% FLUSH
10.0000 mL | Freq: Once | INTRAVENOUS | Status: AC
  Administered 2020-01-02: 10 mL via INTRAVENOUS
  Filled 2020-01-02: qty 10

## 2020-01-02 NOTE — Patient Instructions (Signed)

## 2020-01-28 ENCOUNTER — Telehealth: Payer: Self-pay | Admitting: Adult Health

## 2020-01-28 NOTE — Telephone Encounter (Signed)
[facility]) called, faxed over clearance form for eye surgery. Refaxing clearance form to [phone]

## 2020-01-29 NOTE — Telephone Encounter (Signed)
Received at desk this am.  To JM/NP for completion and signature.

## 2020-01-29 NOTE — Telephone Encounter (Signed)
Completed and placed in out box.  Thank you.

## 2020-01-30 NOTE — Telephone Encounter (Signed)
Faxed to [phone] with fax confirmation received.

## 2020-02-13 ENCOUNTER — Other Ambulatory Visit: Payer: Self-pay

## 2020-02-13 ENCOUNTER — Inpatient Hospital Stay: Payer: Medicare Other | Attending: Hematology & Oncology

## 2020-02-13 VITALS — BP 167/80 | HR 72 | Temp 98.3°F | Resp 18

## 2020-02-13 DIAGNOSIS — D508 Other iron deficiency anemias: Secondary | ICD-10-CM | POA: Insufficient documentation

## 2020-02-13 DIAGNOSIS — Z8572 Personal history of non-Hodgkin lymphomas: Secondary | ICD-10-CM | POA: Insufficient documentation

## 2020-02-13 DIAGNOSIS — Z452 Encounter for adjustment and management of vascular access device: Secondary | ICD-10-CM | POA: Insufficient documentation

## 2020-02-13 DIAGNOSIS — Z95828 Presence of other vascular implants and grafts: Secondary | ICD-10-CM

## 2020-02-13 MED ORDER — HEPARIN SOD (PORK) LOCK FLUSH 100 UNIT/ML IV SOLN
500.0000 [IU] | Freq: Once | INTRAVENOUS | Status: AC
  Administered 2020-02-13: 500 [IU] via INTRAVENOUS
  Filled 2020-02-13: qty 5

## 2020-02-13 MED ORDER — SODIUM CHLORIDE 0.9% FLUSH
10.0000 mL | Freq: Once | INTRAVENOUS | Status: AC
  Administered 2020-02-13: 10 mL via INTRAVENOUS
  Filled 2020-02-13: qty 10

## 2020-02-13 NOTE — Patient Instructions (Signed)

## 2020-03-03 ENCOUNTER — Other Ambulatory Visit: Payer: Self-pay

## 2020-03-03 NOTE — Patient Outreach (Signed)
[place] [facility]) Care Management  03/03/2020  Jaydalee Eichholz 01/26/1923 [id number]   First telephone outreach attempt to obtain mRS. No answer. Left message for returned call.  Philmore Pali under supervision of [facility] Management Assistant [phone]

## 2020-03-04 ENCOUNTER — Other Ambulatory Visit: Payer: Self-pay

## 2020-03-04 NOTE — Patient Outreach (Signed)
[place] [facility]) Care Management  03/04/2020  Makayla Fisher 19-Jun-1922 [id number]   Telephone outreach to patient to obtain mRS was successfully completed. MRS= 0.   [place] Management Assistant [phone]

## 2020-04-02 ENCOUNTER — Other Ambulatory Visit: Payer: Self-pay

## 2020-04-02 ENCOUNTER — Inpatient Hospital Stay: Payer: Medicare Other | Attending: Hematology & Oncology

## 2020-04-02 VITALS — BP 136/80 | HR 62 | Temp 98.5°F | Resp 18

## 2020-04-02 DIAGNOSIS — D508 Other iron deficiency anemias: Secondary | ICD-10-CM | POA: Diagnosis present

## 2020-04-02 DIAGNOSIS — Z452 Encounter for adjustment and management of vascular access device: Secondary | ICD-10-CM | POA: Insufficient documentation

## 2020-04-02 DIAGNOSIS — Z95828 Presence of other vascular implants and grafts: Secondary | ICD-10-CM

## 2020-04-02 DIAGNOSIS — Z8572 Personal history of non-Hodgkin lymphomas: Secondary | ICD-10-CM | POA: Diagnosis present

## 2020-04-02 MED ORDER — SODIUM CHLORIDE 0.9% FLUSH
10.0000 mL | Freq: Once | INTRAVENOUS | Status: AC
  Administered 2020-04-02: 10 mL via INTRAVENOUS
  Filled 2020-04-02: qty 10

## 2020-04-02 MED ORDER — HEPARIN SOD (PORK) LOCK FLUSH 100 UNIT/ML IV SOLN
500.0000 [IU] | Freq: Once | INTRAVENOUS | Status: AC
  Administered 2020-04-02: 500 [IU] via INTRAVENOUS
  Filled 2020-04-02: qty 5

## 2020-04-02 NOTE — Patient Instructions (Signed)

## 2020-05-21 ENCOUNTER — Other Ambulatory Visit: Payer: Self-pay

## 2020-05-21 ENCOUNTER — Encounter: Payer: Self-pay | Admitting: Family

## 2020-05-21 ENCOUNTER — Inpatient Hospital Stay: Payer: Medicare Other | Attending: Hematology & Oncology

## 2020-05-21 ENCOUNTER — Telehealth: Payer: Self-pay

## 2020-05-21 ENCOUNTER — Inpatient Hospital Stay (HOSPITAL_BASED_OUTPATIENT_CLINIC_OR_DEPARTMENT_OTHER): Payer: Medicare Other | Admitting: Hematology & Oncology

## 2020-05-21 ENCOUNTER — Inpatient Hospital Stay: Payer: Medicare Other

## 2020-05-21 ENCOUNTER — Other Ambulatory Visit: Payer: Medicare Other

## 2020-05-21 ENCOUNTER — Ambulatory Visit: Payer: Medicare Other | Admitting: Family

## 2020-05-21 VITALS — BP 156/76 | HR 57 | Temp 98.0°F | Resp 20 | Ht 62.0 in | Wt 161.0 lb

## 2020-05-21 VITALS — BP 156/76 | HR 57 | Temp 98.0°F | Resp 20

## 2020-05-21 DIAGNOSIS — D500 Iron deficiency anemia secondary to blood loss (chronic): Secondary | ICD-10-CM

## 2020-05-21 DIAGNOSIS — Z8572 Personal history of non-Hodgkin lymphomas: Secondary | ICD-10-CM | POA: Insufficient documentation

## 2020-05-21 DIAGNOSIS — I158 Other secondary hypertension: Secondary | ICD-10-CM | POA: Diagnosis not present

## 2020-05-21 DIAGNOSIS — C8201 Follicular lymphoma grade I, lymph nodes of head, face, and neck: Secondary | ICD-10-CM

## 2020-05-21 DIAGNOSIS — K909 Intestinal malabsorption, unspecified: Secondary | ICD-10-CM | POA: Insufficient documentation

## 2020-05-21 DIAGNOSIS — Z452 Encounter for adjustment and management of vascular access device: Secondary | ICD-10-CM | POA: Diagnosis not present

## 2020-05-21 DIAGNOSIS — E038 Other specified hypothyroidism: Secondary | ICD-10-CM

## 2020-05-21 DIAGNOSIS — D508 Other iron deficiency anemias: Secondary | ICD-10-CM | POA: Diagnosis present

## 2020-05-21 DIAGNOSIS — Z95828 Presence of other vascular implants and grafts: Secondary | ICD-10-CM

## 2020-05-21 DIAGNOSIS — Z79899 Other long term (current) drug therapy: Secondary | ICD-10-CM | POA: Diagnosis not present

## 2020-05-21 LAB — CMP (CANCER CENTER ONLY)
ALT: 9 U/L (ref 0–44)
AST: 17 U/L (ref 15–41)
Albumin: 4.1 g/dL (ref 3.5–5.0)
Alkaline Phosphatase: 56 U/L (ref 38–126)
Anion gap: 8 (ref 5–15)
BUN: 19 mg/dL (ref 8–23)
CO2: 26 mmol/L (ref 22–32)
Calcium: 9.8 mg/dL (ref 8.9–10.3)
Chloride: 106 mmol/L (ref 98–111)
Creatinine: 0.89 mg/dL (ref 0.44–1.00)
GFR, Estimated: 60 mL/min — ABNORMAL LOW (ref >60)
Glucose, Bld: 106 mg/dL — ABNORMAL HIGH (ref 70–99)
Potassium: 4 mmol/L (ref 3.5–5.1)
Sodium: 140 mmol/L (ref 135–145)
Total Bilirubin: 0.5 mg/dL (ref 0.3–1.2)
Total Protein: 6.1 g/dL — ABNORMAL LOW (ref 6.5–8.1)

## 2020-05-21 LAB — CBC WITH DIFFERENTIAL (CANCER CENTER ONLY)
Abs Immature Granulocytes: 0.03 10*3/uL (ref 0.00–0.07)
Basophils Absolute: 0.1 10*3/uL (ref 0.0–0.1)
Basophils Relative: 1 %
Eosinophils Absolute: 0.2 10*3/uL (ref 0.0–0.5)
Eosinophils Relative: 3 %
HCT: 42.3 % (ref 36.0–46.0)
Hemoglobin: 13.9 g/dL (ref 12.0–15.0)
Immature Granulocytes: 0 %
Lymphocytes Relative: 25 %
Lymphs Abs: 2.2 10*3/uL (ref 0.7–4.0)
MCH: 31.5 pg (ref 26.0–34.0)
MCHC: 32.9 g/dL (ref 30.0–36.0)
MCV: 95.9 fL (ref 80.0–100.0)
Monocytes Absolute: 0.9 10*3/uL (ref 0.1–1.0)
Monocytes Relative: 10 %
Neutro Abs: 5.4 10*3/uL (ref 1.7–7.7)
Neutrophils Relative %: 61 %
Platelet Count: 129 10*3/uL — ABNORMAL LOW (ref 150–400)
RBC: 4.41 MIL/uL (ref 3.87–5.11)
RDW: 14 % (ref 11.5–15.5)
WBC Count: 8.8 10*3/uL (ref 4.0–10.5)
nRBC: 0 % (ref 0.0–0.2)

## 2020-05-21 MED ORDER — SODIUM CHLORIDE 0.9% FLUSH
10.0000 mL | Freq: Once | INTRAVENOUS | Status: AC
  Administered 2020-05-21: 10 mL via INTRAVENOUS
  Filled 2020-05-21: qty 10

## 2020-05-21 MED ORDER — HEPARIN SOD (PORK) LOCK FLUSH 100 UNIT/ML IV SOLN
500.0000 [IU] | Freq: Once | INTRAVENOUS | Status: AC
  Administered 2020-05-21: 500 [IU] via INTRAVENOUS
  Filled 2020-05-21: qty 5

## 2020-05-21 NOTE — Progress Notes (Deleted)
Hematology and Oncology Follow Up Visit  Makayla Fisher [id number] [birth year] 87 y.o. 05/21/2020   Principle Diagnosis:  Follicular large cell non-Hodgkin's lymphoma Iron deficiency anemia secondary to malabsorption  Current Therapy:        Status post cycle 8 of maintenance Rituxan - completed in June 2018 IV iron as indicated   Interim History:  Makayla Fisher is here today for follow-up. She is    ECOG Performance Status: {CHL ONC ECOG [id number]  Medications:  Allergies as of 05/21/2020      Reactions   Epipen [epinephrine Hcl (nasal)] Palpitations   Levaquin [levofloxacin In D5w] Other (See Comments)   Weakness- "Allergic," per MAR   Chlorhexidine Gluconate Other (See Comments)   Pt declined use and suspects she may have an intolerance   Heparin Other (See Comments)   "Alleregic," pre MAR- Maybe affected platelets, per patient   Sulfa Antibiotics Rash      Medication List       Accurate as of May 21, 2020  1:24 PM. If you have any questions, ask your nurse or doctor.        atorvastatin 10 MG tablet Commonly known as: LIPITOR Take 1 tablet (10 mg total) by mouth daily.   CALCIUM 600+D3 PO Take 1 tablet by mouth at bedtime.   clopidogrel 75 MG tablet Commonly known as: PLAVIX Take 1 tablet (75 mg total) by mouth daily.   Cranberry 425 MG Caps Take 425 mg by mouth daily.   levothyroxine 50 MCG tablet Commonly known as: SYNTHROID Take 50 mcg by mouth daily before breakfast.   lidocaine-prilocaine cream Commonly known as: EMLA Apply 1 application topically See admin instructions. Apply to port-a-cath one hour prior to procedure   lisinopril 20 MG tablet Commonly known as: ZESTRIL Take 1 tablet (20 mg total) by mouth daily.   multivitamin with minerals Tabs tablet Take 1 tablet by mouth daily at 12 noon.   NEOMYCIN-POLYMYXIN-HYDROCORTISONE 1 % Soln OTIC solution Commonly known as: CORTISPORIN Apply 1-2 drops to toe BID after  soaking What changed:   how much to take  when to take this  additional instructions   nystatin cream Commonly known as: MYCOSTATIN Apply 1 application topically every 12 (twelve) hours as needed (to rash between the legs and groin).   psyllium 58.6 % packet Commonly known as: METAMUCIL Take 1 packet by mouth every Monday, Wednesday, and Friday. ORANGE   rOPINIRole 1 MG tablet Commonly known as: REQUIP Take 1 mg by mouth at bedtime.   sertraline 25 MG tablet Commonly known as: ZOLOFT Take 12.5 mg by mouth daily.   Spiriva HandiHaler 18 MCG inhalation capsule Generic drug: tiotropium Place 18 mcg into inhaler and inhale daily.   Systane Complete 0.6 % Soln Generic drug: Propylene Glycol Place 1 drop into both eyes 3 (three) times daily.   Vitamin D3 Super Strength 50 MCG (2000 UT) Caps Generic drug: Cholecalciferol Take 4,000 Units by mouth daily.       Allergies:  Allergies  Allergen Reactions  . Epipen [Epinephrine Hcl (Nasal)] Palpitations  . Levaquin [Levofloxacin In D5w] Other (See Comments)    Weakness- "Allergic," per MAR  . Chlorhexidine Gluconate Other (See Comments)    Pt declined use and suspects she may have an intolerance  . Heparin Other (See Comments)    "Alleregic," pre MAR- Maybe affected platelets, per patient  . Sulfa Antibiotics Rash    Past Medical History, Surgical history, Social history, and Family History were reviewed and updated.  Review of Systems: All other 10 point review of systems is negative.   Physical Exam:  vitals were not taken for this visit.   Wt Readings from Last 3 Encounters:  12/26/19 175 lb (79.4 kg)  11/27/19 187 lb 6.3 oz (85 kg)  11/21/19 (P) 184 lb (83.5 kg)    Ocular: Sclerae unicteric, pupils equal, round and reactive to light Ear-nose-throat: Oropharynx clear, dentition fair Lymphatic: No cervical or supraclavicular adenopathy Lungs no rales or rhonchi, good excursion bilaterally Heart regular  rate and rhythm, no murmur appreciated Abd soft, nontender, positive bowel sounds MSK no focal spinal tenderness, no joint edema Neuro: non-focal, well-oriented, appropriate affect Breasts:   Lab Results  Component Value Date   WBC 8.6 11/27/2019   HGB 15.0 11/27/2019   HCT 44.0 11/27/2019   MCV 96.0 11/27/2019   PLT 148 (L) 11/27/2019   Lab Results  Component Value Date   FERRITIN 73 11/21/2019   IRON 50 11/21/2019   TIBC 264 11/21/2019   UIBC 214 11/21/2019   IRONPCTSAT 19 (L) 11/21/2019   Lab Results  Component Value Date   RETICCTPCT 0.8 10/23/2014   RBC 4.51 11/27/2019   RETICCTABS 29.0 10/23/2014   No results found for: KPAFRELGTCHN, LAMBDASER, KAPLAMBRATIO Lab Results  Component Value Date   IGGSERUM 548 (L) 12/19/2016   IGA 30 (L) 12/19/2016   IGMSERUM 17 (L) 12/19/2016   No results found for: Kathrynn Ducking, MSPIKE, SPEI   Chemistry      Component Value Date/Time   NA 138 11/27/2019 0208   NA 143 01/31/2017 0931   NA 141 05/08/2014 1041   K 3.5 11/27/2019 0208   K 3.7 01/31/2017 0931   K 4.5 05/08/2014 1041   CL 101 11/27/2019 0208   CL 106 01/31/2017 0931   CO2 26 11/27/2019 0203   CO2 27 01/31/2017 0931   CO2 24 05/08/2014 1041   BUN 14 11/27/2019 0208   BUN 10 01/31/2017 0931   BUN 14.2 05/08/2014 1041   CREATININE 0.90 11/27/2019 0208   CREATININE 1.11 (H) 08/20/2019 1445   CREATININE 0.9 01/31/2017 0931   CREATININE 0.8 05/08/2014 1041      Component Value Date/Time   CALCIUM 10.1 11/27/2019 0203   CALCIUM 9.7 01/31/2017 0931   CALCIUM 9.2 05/08/2014 1041   ALKPHOS 63 11/27/2019 0203   ALKPHOS 57 01/31/2017 0931   ALKPHOS 66 05/08/2014 1041   AST 26 11/27/2019 0203   AST 14 (L) 08/20/2019 1445   AST 23 05/08/2014 1041   ALT 15 11/27/2019 0203   ALT 8 08/20/2019 1445   ALT 14 01/31/2017 0931   ALT 10 05/08/2014 1041   BILITOT 0.8 11/27/2019 0203   BILITOT 0.3 08/20/2019 1445   BILITOT  0.42 05/08/2014 1041       Impression and Plan: Makayla Fisher is a 87 yo caucasian female with history of follicular large cell lymphoma.   Laverna Peace, NP 1/27/20221:24 PM

## 2020-05-21 NOTE — Progress Notes (Signed)
Hematology and Oncology Follow Up Visit  Makayla Fisher [id number] 04-11-1923 87 y.o. 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  Makayla Fisher is back for followup.  She seems to be doing pretty well.  She is now 87 years old.  She really does not have any other family in the area.  She is at the nursing home.  She does get lonely there.  I really do feel bad for her.  Apparently, she did have a TIA this past summer.  She now is on Lipitor and Plavix.  When we last saw her, we checked a TSH.  It was normal at 2.9.  We are checking her iron studies.  .  She has lost weight.  She is tried to lose weight.  She has had no associated symptoms with weight loss.  Her colostomy is working well.  She has had no nausea or vomiting.  She just is watching the number of calories that she takes in.  She has had no rashes.  There is been no fever.  She has had no problems with the coronavirus.  Overall, her performance status is by ECOG 2.     Medications:  Current Outpatient Medications:  .  atorvastatin (LIPITOR) 10 MG tablet, Take 1 tablet (10 mg total) by mouth daily., Disp: 30 tablet, Rfl: 2 .  Calcium Carb-Cholecalciferol (CALCIUM 600+D3 PO), Take 1 tablet by mouth at bedtime., Disp: , Rfl:  .  Cholecalciferol (VITAMIN D3 SUPER STRENGTH) 50 MCG (2000 UT) CAPS, Take 4,000 Units by mouth daily. , Disp: , Rfl:  .  clopidogrel (PLAVIX) 75 MG tablet, Take 1 tablet (75 mg total) by mouth daily., Disp: 30 tablet, Rfl: 2 .  Cranberry 425 MG CAPS, Take 425 mg by mouth daily. , Disp: , Rfl:  .  levothyroxine (SYNTHROID, LEVOTHROID) 50 MCG tablet, Take 50 mcg by mouth daily before breakfast. , Disp: , Rfl:  .  lidocaine-prilocaine (EMLA) cream, Apply 1 application  topically See admin instructions. Apply to port-a-cath one hour prior to procedure, Disp: , Rfl:  .  lisinopril (PRINIVIL,ZESTRIL) 20 MG tablet, Take 1 tablet (20 mg total) by mouth daily., Disp: 30 tablet, Rfl: 3 .  Multiple Vitamin (MULTIVITAMIN WITH MINERALS) TABS tablet, Take 1 tablet by mouth daily at 12 noon. , Disp: , Rfl:  .  NEOMYCIN-POLYMYXIN-HYDROCORTISONE (CORTISPORIN) 1 % SOLN OTIC solution, Apply 1-2 drops to toe BID after soaking (Patient taking differently: 1-2 drops See admin instructions. Apply 1-2 drops to affected toe 2 times a day after soaking), Disp: 10 mL, Rfl: 1 .  nystatin cream (MYCOSTATIN), Apply 1 application topically every 12 (twelve) hours as needed (to rash between the legs and groin)., Disp: , Rfl:  .  psyllium (METAMUCIL) 58.6 % packet, Take 1 packet by mouth every Monday, Wednesday, and Friday. ORANGE, Disp: , Rfl:  .  rOPINIRole (REQUIP) 1 MG tablet, Take 1 mg by mouth at bedtime., Disp: , Rfl:  .  sertraline (ZOLOFT) 25 MG tablet, Take 12.5 mg by mouth daily. , Disp: , Rfl:  .  SPIRIVA HANDIHALER 18 MCG inhalation capsule, Place 18 mcg into inhaler and inhale daily. , Disp: , Rfl:  .  SYSTANE COMPLETE 0.6 % SOLN, Place 1 drop into both eyes 3 (three) times daily. ,  Disp: , Rfl:   Allergies:  Allergies  Allergen Reactions  . Epipen [Epinephrine Hcl (Nasal)] Palpitations  . Levaquin [Levofloxacin In D5w] Other (See Comments)    Weakness- "Allergic," per MAR  . Chlorhexidine Gluconate Other (See Comments)    Pt declined use and suspects she may have an intolerance  . Heparin Other (See Comments)    "Alleregic," pre MAR- Maybe affected platelets, per patient  . Sulfa Antibiotics Rash    Past Medical History, Surgical history, Social history, and Family History were reviewed and updated.  Review of Systems: Review of Systems  Constitutional: Negative.   HENT: Negative.   Eyes: Negative.   Respiratory: Positive for shortness of breath.   Cardiovascular:  Positive for palpitations.  Gastrointestinal: Positive for diarrhea and nausea.  Genitourinary: Negative.   Musculoskeletal: Positive for joint pain and myalgias.  Skin: Negative.   Neurological: Positive for tingling.  Endo/Heme/Allergies: Negative.   Psychiatric/Behavioral: Negative.      Physical Exam:  height is 5\' 2"  (1.575 m) and weight is 161 lb (73 kg). Her oral temperature is 98 F (36.7 C). Her blood pressure is 156/76 (abnormal) and her pulse is 57 (abnormal). Her respiration is 20 and oxygen saturation is 97%.   Physical Exam Vitals reviewed.  HENT:     Head: Normocephalic and atraumatic.  Eyes:     Pupils: Pupils are equal, round, and reactive to light.  Cardiovascular:     Rate and Rhythm: Normal rate and regular rhythm.     Heart sounds: Normal heart sounds.  Pulmonary:     Effort: Pulmonary effort is normal.     Breath sounds: Normal breath sounds.  Abdominal:     General: Bowel sounds are normal.     Palpations: Abdomen is soft.     Comments: Abdominal exam shows a colostomy in the left lower quadrant.  She has multiple surgical scars.  She has no fluid wave.  There is no guarding or rebound tenderness.  There is no palpable liver or spleen tip.  Musculoskeletal:        General: No tenderness or deformity. Normal range of motion.     Cervical back: Normal range of motion.  Lymphadenopathy:     Cervical: No cervical adenopathy.  Skin:    General: Skin is warm and dry.     Findings: No erythema or rash.  Neurological:     Mental Status: She is alert and oriented to person, place, and time.  Psychiatric:        Behavior: Behavior normal.        Thought Content: Thought content normal.        Judgment: Judgment normal.      Lab Results  Component Value Date   WBC 8.8 05/21/2020   HGB 13.9 05/21/2020   HCT 42.3 05/21/2020   MCV 95.9 05/21/2020   PLT 129 (L) 05/21/2020     Chemistry      Component Value Date/Time   NA 140 05/21/2020 1319   NA  143 01/31/2017 0931   NA 141 05/08/2014 1041   K 4.0 05/21/2020 1319   K 3.7 01/31/2017 0931   K 4.5 05/08/2014 1041   CL 106 05/21/2020 1319   CL 106 01/31/2017 0931   CO2 26 05/21/2020 1319   CO2 27 01/31/2017 0931   CO2 24 05/08/2014 1041   BUN 19 05/21/2020 1319   BUN 10 01/31/2017 0931   BUN 14.2 05/08/2014 1041   CREATININE 0.89 05/21/2020 1319  CREATININE 0.9 01/31/2017 0931   CREATININE 0.8 05/08/2014 1041      Component Value Date/Time   CALCIUM 9.8 05/21/2020 1319   CALCIUM 9.7 01/31/2017 0931   CALCIUM 9.2 05/08/2014 1041   ALKPHOS 56 05/21/2020 1319   ALKPHOS 57 01/31/2017 0931   ALKPHOS 66 05/08/2014 1041   AST 17 05/21/2020 1319   AST 23 05/08/2014 1041   ALT 9 05/21/2020 1319   ALT 14 01/31/2017 0931   ALT 10 05/08/2014 1041   BILITOT 0.5 05/21/2020 1319   BILITOT 0.42 05/08/2014 1041      Impression and Plan: Ms. Mcdill is 87 year old female. She has a follicular large cell lymphoma. I do not see any evidence of recurrent disease. I do not believe that she is at significant risk for occurrence.   We will have to check her thyroid today.  We will see what her iron studies show.  It sounds like she is having some problems with her hearing aids.  Hopefully she can get these fixed.  The fact that she is 87 years old is just amazing to me.  I really do not see any issues with lymphoma.  I have no problems with her losing weight.  This is a conscientious weight loss.  As always, we will plan to get her back in 6 months.    [name], MD 1/27/20222:55 PM

## 2020-05-21 NOTE — Telephone Encounter (Signed)
appts made per los and have been printed and mailed to the pt   Fintan Grater

## 2020-05-22 ENCOUNTER — Ambulatory Visit: Payer: Medicare Other | Admitting: Family

## 2020-05-22 ENCOUNTER — Telehealth: Payer: Self-pay | Admitting: *Deleted

## 2020-05-22 ENCOUNTER — Other Ambulatory Visit: Payer: Medicare Other

## 2020-05-22 LAB — IRON AND TIBC
Iron: 81 ug/dL (ref 41–142)
Saturation Ratios: 40 % (ref 21–57)
TIBC: 204 ug/dL — ABNORMAL LOW (ref 236–444)
UIBC: 123 ug/dL (ref 120–384)

## 2020-05-22 LAB — FERRITIN: Ferritin: 491 ng/mL — ABNORMAL HIGH (ref 11–307)

## 2020-05-22 LAB — TSH: TSH: 3.239 u[IU]/mL (ref 0.308–3.960)

## 2020-05-22 NOTE — Telephone Encounter (Signed)
Call placed to patient and message left to notify her per order of Dr. Marin Olp that "the iron and thyroid levels are ok!! Laurey Arrow"  Instructed pt to call office back with any questions or concerns.

## 2020-05-22 NOTE — Telephone Encounter (Signed)
-----   Message from [name], MD sent at 05/22/2020 10:33 AM EST ----- Call - the iron and thyroid levels are ok!!  Makayla Fisher

## 2020-06-29 ENCOUNTER — Ambulatory Visit (INDEPENDENT_AMBULATORY_CARE_PROVIDER_SITE_OTHER): Payer: Medicare Other | Admitting: Adult Health

## 2020-06-29 ENCOUNTER — Encounter: Payer: Self-pay | Admitting: Adult Health

## 2020-06-29 VITALS — BP 140/80 | HR 66 | Ht 60.0 in | Wt 161.0 lb

## 2020-06-29 DIAGNOSIS — R2990 Unspecified symptoms and signs involving the nervous system: Secondary | ICD-10-CM | POA: Diagnosis not present

## 2020-06-29 NOTE — Progress Notes (Signed)
[facility] [address] [place]. [place] [address] [phone]       STROKE FOLLOW UP NOTE  Ms. Caraline Deutschman Date of Birth:  [birth year] Medical Record Number:  [id number]   Reason for Referral: stroke follow up    SUBJECTIVE:   CHIEF COMPLAINT:  Chief Complaint  Patient presents with  . Follow-up    RM 14 alone Pt is well, no complaints     HPI:   Today, 06/29/2020, Ms. Link returns for 85-month stroke follow-up unaccompanied  She continues to reside at [facility] Stable from stroke standpoint without new or recurring stroke/TIA symptoms - denies residual deficits  Compliant on Plavix and atorvastatin -denies side effects Blood pressure today 140/80 Reports recent lab work completed by facility but unsure of results  No new concerns at this time    History provided for reference purposes only Initial visit 12/26/2019 JM: Ms. Funderburke is being seen for hospital follow-up unaccompanied She continues to reside at [facility] She has been doing well since discharge without residual deficits and denies new or reoccurring stroke/TIA symptoms She does report increased anxiety due to fear of reoccurring stroke.  PCP recently restarted sertraline as she does have underlying history of anxiety She has been working with PT/OT at facility working on strengthening and balance Ambulates with rollator walker which she used PTA (since 2009 post heart surgery) Completed 3 weeks DAPT and remains on Plavix alone without bleeding or bruising Continues on atorvastatin 10 mg daily without myalgias  blood pressure today elevated.  Reports blood pressure typically elevated at appointments, occasionally monitors at facility which has been stable She does report increased fatigue and lack of energy but has recently received iron transfusions for known anemia as well as being treated for UTI No further concerns at this time  Stroke admission  11/27/2019 Ms.Ylianna Thorntonis a 87 y.o.femalewith history of non-Hodgkin's lymphoma, MI, iron deficiency anemiawho presented on 11/27/2019 with altered mental status, right-sided weakness and abnormal speech. Received IV TPA with NIHSS 9.  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  EEG showed intermittent left temporal slowing without evidence of seizure.  Recommended DAPT for 3 weeks then Plavix alone as previously on aspirin. Hx of HTN stable and resume lisinopril 20 mg daily.  LDL 136 and initiate atorvastatin 10 mg daily (low-dose due to 87).  No evidence or history of DM with A1c 5.4.  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  Other active problems include history of non-Hodgkin's lymphoma s/p chemo 2015 on maintenance retuxin, iron deficiency anemia, GERD and colostomy.  Evaluated by therapy and recommended discharge back to ALF with home health PT/OT/SLP.  Stroke-like symptoms s/p IV tPA L brainTIA s/p tPA, possible recurrent amyloid angiopathy spells d/t small vessel disease  Code Stroke CT head No acute abnormality. ASPECTS 10.   CTA head & neckno LVO. B ICA stenosis < 50%. Aortic atherosclerosis. Emphysema.  MRIno acute infarct    2D EchoEF 60-65%. No source of embolus. Moderate LA dilation  EEG intermittent L temporal lobe slowing. No sz   LDL136  HgbA1c5.4  VTE prophylaxis -SCDs  aspirin 81 mg dailyprior to admission, resume aspirin 81. Add plavix x 3 weeks then plavix alone.   Therapy recommendations:HH SLP. HH PT,  [place] OT   Disposition:return to ALF       ROS:   14 system review of systems performed and negative with exception of no complaints  PMH:  Past Medical History:  Diagnosis Date  . GERD  (gastroesophageal reflux disease)   . Heart attack ([place]) 2009  . History of blood transfusion 2009  . Iron deficiency anemia due to chronic blood loss 08/07/2014  . NHL (non-Hodgkin's lymphoma) ([place]) 10/18/2013   finished chemo dec 2015, maintenanance retuxin    PSH:  Past Surgical History:  Procedure Laterality Date  . c section  1947, 1963  . CARDIAC SURGERY  2009  . CHOLECYSTECTOMY  1973  . COLON SURGERY  2014   Colostomy  . COLOSTOMY REVISION N/A 06/12/2014   Procedure: COLOSTOMY REVISION;  Surgeon: [name], MD;  Location: [facility];  Service: General;  Laterality: N/A;  . CORONARY ANGIOPLASTY WITH STENT PLACEMENT  2009  . CORONARY ARTERY BYPASS GRAFT  aug 2009    x3  . ESOPHAGOGASTRODUODENOSCOPY  2013  . EYE SURGERY Bilateral  10 years ago   lens replacments cataracts  . right chest pac  june 2015    Social History:  Social History   Socioeconomic History  . Marital status: Widowed    Spouse name: Not on file  . Number of children: Not on file  . Years of education: Not on file  . Highest education level: Not on file  Occupational History  . Not on file  Tobacco Use  . Smoking status: Former Smoker    Packs/day: 1.00    Years: 25.00    Pack years: 25.00    Types: Cigarettes    Start date: 05/16/1948    Quit date: 05/17/1963    Years since quitting: 57.1  . Smokeless tobacco: Never Used  . Tobacco comment: quit smoking 50 years ago  Vaping Use  . Vaping Use: Never used  Substance and Sexual Activity  . Alcohol use: No    Alcohol/week: 0.0 standard drinks  . Drug use: No  . Sexual activity: Not on file  Other Topics Concern  . Not on file  Social History Narrative  . Not on file   Social Determinants of Health   Financial Resource Strain: Not on file  Food Insecurity: Not on file  Transportation Needs: Not on file  Physical Activity: Not on file  Stress: Not on file  Social Connections: Not on file  Intimate Partner Violence: Not on file     Family History:  Family History  Problem Relation Age of Onset  . Diverticulitis Mother   . Heart attack Mother   . Ulcers Father     Medications:   Current Outpatient Medications on File Prior to Visit  Medication Sig Dispense Refill  . atorvastatin (LIPITOR) 10 MG tablet Take 1 tablet (10 mg total) by mouth daily. 30 tablet 2  . Calcium Carb-Cholecalciferol (CALCIUM 600+D3 PO) Take 1 tablet by mouth at bedtime.    . Cholecalciferol (VITAMIN D3 SUPER STRENGTH) 50 MCG (2000 UT) CAPS Take 4,000 Units by mouth daily.     . clopidogrel (PLAVIX) 75 MG tablet Take 1 tablet (75 mg total) by mouth daily. 30 tablet 2  . Cranberry 425 MG CAPS Take 425 mg by mouth daily.     Marland Kitchen levothyroxine (SYNTHROID, LEVOTHROID) 50 MCG tablet Take 50 mcg by mouth daily before breakfast.     . lidocaine-prilocaine (EMLA) cream Apply 1 application topically See admin instructions. Apply to port-a-cath  one hour prior to procedure    . lisinopril (PRINIVIL,ZESTRIL) 20 MG tablet Take 1 tablet (20 mg total) by mouth daily. 30 tablet 3  . Multiple Vitamin (MULTIVITAMIN WITH MINERALS) TABS tablet Take 1 tablet by mouth daily at 12 noon.     . NEOMYCIN-POLYMYXIN-HYDROCORTISONE (CORTISPORIN) 1 % SOLN OTIC solution Apply 1-2 drops to toe BID after soaking (Patient taking differently: 1-2 drops See admin instructions. Apply 1-2 drops to affected toe 2 times a day after soaking) 10 mL 1  . nystatin cream (MYCOSTATIN) Apply 1 application topically every 12 (twelve) hours as needed (to rash between the legs and groin).    . psyllium (METAMUCIL) 58.6 % packet Take 1 packet by mouth every Monday, Wednesday, and Friday. ORANGE    . rOPINIRole (REQUIP) 1 MG tablet Take 1 mg by mouth at bedtime.    . sertraline (ZOLOFT) 25 MG tablet Take 12.5 mg by mouth daily.     Marland Kitchen SPIRIVA HANDIHALER 18 MCG inhalation capsule Place 18 mcg into inhaler and inhale daily.     Carren Rang COMPLETE 0.6 % SOLN Place 1 drop into both eyes 3 (three)  times daily.      No current facility-administered medications on file prior to visit.    Allergies:   Allergies  Allergen Reactions  . Epipen [Epinephrine Hcl (Nasal)] Palpitations  . Levaquin [Levofloxacin In D5w] Other (See Comments)    Weakness- "Allergic," per MAR  . Chlorhexidine Gluconate Other (See Comments)    Pt declined use and suspects she may have an intolerance  . Heparin Other (See Comments)    "Alleregic," pre MAR- Maybe affected platelets, per patient  . Sulfa Antibiotics Rash      OBJECTIVE:  Physical Exam  Vitals:   06/29/20 1431  BP: 140/80  Pulse: 66  Weight: 161 lb (73 kg)  Height: 5' (1.524 m)   Body mass index is 31.44 kg/m. No exam data present  General: well developed, well nourished, very pleasant elderly Caucasian female, seated, in no evident distress Head: head normocephalic and atraumatic.   Neck: supple with no carotid or supraclavicular bruits Cardiovascular: regular rate and rhythm, no murmurs Musculoskeletal: no deformity Skin:  no rash/petichiae Vascular:  Normal pulses all extremities   Neurologic Exam Mental Status: Awake and fully alert.   Fluent speech and language.  Oriented to place and time. Recent and remote memory intact. Attention span, concentration and fund of knowledge appropriate. Mood and affect appropriate.  Cranial Nerves: Pupils equal, briskly reactive to light. Extraocular movements full without nystagmus. Visual fields full to confrontation. Severe HOH bilaterally despite hearing aids.  Facial sensation intact. Face, tongue, palate moves normally and symmetrically.  Motor: Normal bulk and tone. Normal strength in all tested extremity muscles. Sensory.: intact to touch , pinprick , position and vibratory sensation.  Coordination: Rapid alternating movements normal in all extremities. Finger-to-nose and heel-to-shin performed accurately bilaterally. Gait and Station: Arises from chair without difficulty. Stance is  normal. Gait demonstrates normal stride length and balance with use of Rollator walker Reflexes: 1+ and symmetric. Toes downgoing.        ASSESSMENT: Indiah Heyden is a 87 y.o. year old female presented with altered mental status, right-sided weakness and abnormal speech on 11/27/2019 possibly strokelike symptoms vs TIA s/p tPA but possibly recurrent amyloid angiopathy spells due to small vessel disease. Vascular risk factors include HTN, HLD, advanced age, former tobacco use, obesity, history of right brain TIA 2019, CAP s/p MI, CABG and stent, migraines  and history of CHF (not currently present).     PLAN:  1. Strokelike episode vs TIA s/p tPA:  a. Doing well without reoccurring or new symptoms. b.  Continue clopidogrel 75 mg daily  and atorvastatin 10 mg daily for secondary stroke prevention.  c. Discussed secondary stroke prevention measures and importance of close PCP follow up for aggressive stroke risk factor management  2. HTN:  a. BP goal <130/90.  Stable.  Monitor by SNF 3. HLD:  a. LDL goal <70. Prior LDL 136.  On atorvastatin 10 mg daily.  Recent lab work per patient -request to ensure completion of lipid panel at facility as she is not currently fasting.  If recently completed or completed in the future, request results to be faxed to office for further review    Follow up in 6 months or call earlier if needed   CC:  [place] provider: Dr. Iris Pert, [place], DO    I spent 30 minutes of face-to-face and non-face-to-face time with patient.  This included previsit chart review, lab review, study review, order entry, electronic health record documentation, patient education regarding strokelike episode vs TIA s/p TPA, importance of managing stroke risk factors and answered all other questions to patient satisfaction  Frann Rider, [facility]  [facility] [address] [place], [place] [address]  Phone [phone] Fax [phone] Note:  This document was prepared with digital dictation and possible smart phrase technology. Any transcriptional errors that result from this process are unintentional.

## 2020-06-29 NOTE — Patient Instructions (Signed)
Continue clopidogrel 75 mg daily  and atorvastatin for secondary stroke prevention  Continue to follow up with PCP regarding cholesterol and blood pressure management -please ensure routine lipid panel monitoring completed at facility - please fax lab work results to our office so we can review them as they are not available via epic Maintain strict control of hypertension with blood pressure goal below 130/90 and cholesterol with LDL cholesterol (bad cholesterol) goal below 70 mg/dL.       Followup in the future with me in 6 months or call earlier if needed       Thank you for coming to see [place] at [facility]. I hope we have been able to provide you high quality care today.  You may receive a patient satisfaction survey over the next few weeks. We would appreciate your feedback and comments so that we may continue to improve ourselves and the health of our patients.

## 2020-06-29 NOTE — Progress Notes (Signed)
I agree with the above plan 

## 2020-07-02 ENCOUNTER — Telehealth: Payer: Self-pay | Admitting: *Deleted

## 2020-07-02 NOTE — Telephone Encounter (Signed)
Received lab results.

## 2020-07-02 NOTE — Telephone Encounter (Signed)
Reviewed recent lab work obtained lab [facility] which was obtained on 06/18/2020  Lipid panel Cholesterol 123  triglyceride 83 HLD 47 LDL 59   A1c 5.5

## 2020-07-16 ENCOUNTER — Inpatient Hospital Stay: Payer: Medicare Other | Attending: Hematology & Oncology

## 2020-07-16 ENCOUNTER — Other Ambulatory Visit: Payer: Self-pay

## 2020-07-16 VITALS — BP 164/97 | HR 55 | Temp 99.2°F

## 2020-07-16 DIAGNOSIS — D508 Other iron deficiency anemias: Secondary | ICD-10-CM | POA: Insufficient documentation

## 2020-07-16 DIAGNOSIS — Z8572 Personal history of non-Hodgkin lymphomas: Secondary | ICD-10-CM | POA: Insufficient documentation

## 2020-07-16 DIAGNOSIS — Z452 Encounter for adjustment and management of vascular access device: Secondary | ICD-10-CM | POA: Diagnosis not present

## 2020-07-16 DIAGNOSIS — Z95828 Presence of other vascular implants and grafts: Secondary | ICD-10-CM

## 2020-07-16 MED ORDER — SODIUM CHLORIDE 0.9% FLUSH
10.0000 mL | Freq: Once | INTRAVENOUS | Status: AC
  Administered 2020-07-16: 10 mL via INTRAVENOUS
  Filled 2020-07-16: qty 10

## 2020-07-16 MED ORDER — HEPARIN SOD (PORK) LOCK FLUSH 100 UNIT/ML IV SOLN
500.0000 [IU] | Freq: Once | INTRAVENOUS | Status: AC
  Administered 2020-07-16: 500 [IU] via INTRAVENOUS
  Filled 2020-07-16: qty 5

## 2020-07-16 NOTE — Patient Instructions (Signed)
Implanted Port Insertion, Care After This sheet gives you information about how to care for yourself after your procedure. Your health care provider may also give you more specific instructions. If you have problems or questions, contact your health care provider. What can I expect after the procedure? After the procedure, it is common to have:  Discomfort at the port insertion site.  Bruising on the skin over the port. This should improve over 3-4 days. Follow these instructions at home: Port care  After your port is placed, you will get a manufacturer's information card. The card has information about your port. Keep this card with you at all times.  Take care of the port as told by your health care provider. Ask your health care provider if you or a family member can get training for taking care of the port at home. A home health care nurse may also take care of the port.  Make sure to remember what type of port you have. Incision care  Follow instructions from your health care provider about how to take care of your port insertion site. Make sure you: ? Wash your hands with soap and water before and after you change your bandage (dressing). If soap and water are not available, use hand sanitizer. ? Change your dressing as told by your health care provider. ? Leave stitches (sutures), skin glue, or adhesive strips in place. These skin closures may need to stay in place for 2 weeks or longer. If adhesive strip edges start to loosen and curl up, you may trim the loose edges. Do not remove adhesive strips completely unless your health care provider tells you to do that.  Check your port insertion site every day for signs of infection. Check for: ? Redness, swelling, or pain. ? Fluid or blood. ? Warmth. ? Pus or a bad smell.      Activity  Return to your normal activities as told by your health care provider. Ask your health care provider what activities are safe for you.  Do not  lift anything that is heavier than 10 lb (4.5 kg), or the limit that you are told, until your health care provider says that it is safe. General instructions  Take over-the-counter and prescription medicines only as told by your health care provider.  Do not take baths, swim, or use a hot tub until your health care provider approves. Ask your health care provider if you may take showers. You may only be allowed to take sponge baths.  Do not drive for 24 hours if you were given a sedative during your procedure.  Wear a medical alert bracelet in case of an emergency. This will tell any health care providers that you have a port.  Keep all follow-up visits as told by your health care provider. This is important. Contact a health care provider if:  You cannot flush your port with saline as directed, or you cannot draw blood from the port.  You have a fever or chills.  You have redness, swelling, or pain around your port insertion site.  You have fluid or blood coming from your port insertion site.  Your port insertion site feels warm to the touch.  You have pus or a bad smell coming from the port insertion site. Get help right away if:  You have chest pain or shortness of breath.  You have bleeding from your port that you cannot control. Summary  Take care of the port as told by your   health care provider. Keep the manufacturer's information card with you at all times.  Change your dressing as told by your health care provider.  Contact a health care provider if you have a fever or chills or if you have redness, swelling, or pain around your port insertion site.  Keep all follow-up visits as told by your health care provider. This information is not intended to replace advice given to you by your health care provider. Make sure you discuss any questions you have with your health care provider. Document Revised: 11/07/2017 Document Reviewed: 11/07/2017 Elsevier Patient Education   2021 [employer]  

## 2020-07-23 ENCOUNTER — Other Ambulatory Visit (HOSPITAL_COMMUNITY): Payer: Self-pay

## 2020-07-23 ENCOUNTER — Inpatient Hospital Stay (HOSPITAL_COMMUNITY): Payer: Medicare Other

## 2020-07-23 ENCOUNTER — Encounter (HOSPITAL_COMMUNITY): Payer: Self-pay | Admitting: Internal Medicine

## 2020-07-23 ENCOUNTER — Inpatient Hospital Stay (HOSPITAL_COMMUNITY)
Admission: EM | Admit: 2020-07-23 | Discharge: 2020-07-27 | DRG: 280 | Disposition: A | Discharge status: Home Health | Payer: Medicare Other | Source: Skilled Nursing Facility | Attending: Internal Medicine | Admitting: Internal Medicine

## 2020-07-23 ENCOUNTER — Emergency Department (HOSPITAL_COMMUNITY): Payer: Medicare Other

## 2020-07-23 ENCOUNTER — Other Ambulatory Visit: Payer: Self-pay

## 2020-07-23 DIAGNOSIS — I5033 Acute on chronic diastolic (congestive) heart failure: Secondary | ICD-10-CM | POA: Diagnosis present

## 2020-07-23 DIAGNOSIS — E876 Hypokalemia: Secondary | ICD-10-CM | POA: Diagnosis present

## 2020-07-23 DIAGNOSIS — Z79899 Other long term (current) drug therapy: Secondary | ICD-10-CM | POA: Diagnosis not present

## 2020-07-23 DIAGNOSIS — Z8572 Personal history of non-Hodgkin lymphomas: Secondary | ICD-10-CM

## 2020-07-23 DIAGNOSIS — I2510 Atherosclerotic heart disease of native coronary artery without angina pectoris: Secondary | ICD-10-CM | POA: Diagnosis present

## 2020-07-23 DIAGNOSIS — Z7902 Long term (current) use of antithrombotics/antiplatelets: Secondary | ICD-10-CM

## 2020-07-23 DIAGNOSIS — I110 Hypertensive heart disease with heart failure: Secondary | ICD-10-CM | POA: Diagnosis present

## 2020-07-23 DIAGNOSIS — Z79890 Hormone replacement therapy (postmenopausal): Secondary | ICD-10-CM | POA: Diagnosis not present

## 2020-07-23 DIAGNOSIS — R001 Bradycardia, unspecified: Secondary | ICD-10-CM | POA: Diagnosis present

## 2020-07-23 DIAGNOSIS — E039 Hypothyroidism, unspecified: Secondary | ICD-10-CM | POA: Diagnosis present

## 2020-07-23 DIAGNOSIS — I509 Heart failure, unspecified: Secondary | ICD-10-CM

## 2020-07-23 DIAGNOSIS — J9601 Acute respiratory failure with hypoxia: Secondary | ICD-10-CM

## 2020-07-23 DIAGNOSIS — Z66 Do not resuscitate: Secondary | ICD-10-CM | POA: Diagnosis present

## 2020-07-23 DIAGNOSIS — Z888 Allergy status to other drugs, medicaments and biological substances status: Secondary | ICD-10-CM

## 2020-07-23 DIAGNOSIS — Z87891 Personal history of nicotine dependence: Secondary | ICD-10-CM

## 2020-07-23 DIAGNOSIS — I10 Essential (primary) hypertension: Secondary | ICD-10-CM | POA: Diagnosis present

## 2020-07-23 DIAGNOSIS — Z8673 Personal history of transient ischemic attack (TIA), and cerebral infarction without residual deficits: Secondary | ICD-10-CM | POA: Diagnosis not present

## 2020-07-23 DIAGNOSIS — Z955 Presence of coronary angioplasty implant and graft: Secondary | ICD-10-CM | POA: Diagnosis not present

## 2020-07-23 DIAGNOSIS — Z20822 Contact with and (suspected) exposure to covid-19: Secondary | ICD-10-CM | POA: Diagnosis present

## 2020-07-23 DIAGNOSIS — I214 Non-ST elevation (NSTEMI) myocardial infarction: Secondary | ICD-10-CM | POA: Diagnosis present

## 2020-07-23 DIAGNOSIS — I5031 Acute diastolic (congestive) heart failure: Secondary | ICD-10-CM

## 2020-07-23 DIAGNOSIS — N179 Acute kidney failure, unspecified: Secondary | ICD-10-CM | POA: Diagnosis present

## 2020-07-23 DIAGNOSIS — Z951 Presence of aortocoronary bypass graft: Secondary | ICD-10-CM | POA: Diagnosis not present

## 2020-07-23 DIAGNOSIS — I252 Old myocardial infarction: Secondary | ICD-10-CM

## 2020-07-23 DIAGNOSIS — Z882 Allergy status to sulfonamides status: Secondary | ICD-10-CM

## 2020-07-23 DIAGNOSIS — Z7982 Long term (current) use of aspirin: Secondary | ICD-10-CM

## 2020-07-23 DIAGNOSIS — I160 Hypertensive urgency: Secondary | ICD-10-CM | POA: Diagnosis present

## 2020-07-23 DIAGNOSIS — J810 Acute pulmonary edema: Secondary | ICD-10-CM | POA: Diagnosis not present

## 2020-07-23 DIAGNOSIS — D7582 Heparin induced thrombocytopenia (HIT): Secondary | ICD-10-CM | POA: Diagnosis not present

## 2020-07-23 DIAGNOSIS — Z933 Colostomy status: Secondary | ICD-10-CM

## 2020-07-23 DIAGNOSIS — R0902 Hypoxemia: Secondary | ICD-10-CM

## 2020-07-23 DIAGNOSIS — E785 Hyperlipidemia, unspecified: Secondary | ICD-10-CM | POA: Diagnosis present

## 2020-07-23 DIAGNOSIS — I340 Nonrheumatic mitral (valve) insufficiency: Secondary | ICD-10-CM | POA: Diagnosis not present

## 2020-07-23 DIAGNOSIS — Z8249 Family history of ischemic heart disease and other diseases of the circulatory system: Secondary | ICD-10-CM

## 2020-07-23 HISTORY — DX: Allergy status to other drugs, medicaments and biological substances: Z88.8

## 2020-07-23 HISTORY — DX: Hypoxemia: R09.02

## 2020-07-23 LAB — ECHOCARDIOGRAM COMPLETE
AR max vel: 1.79 cm2
AV Area VTI: 1.76 cm2
AV Area mean vel: 1.86 cm2
AV Mean grad: 7 mmHg
AV Peak grad: 15.2 mmHg
Ao pk vel: 1.95 m/s
Area-P 1/2: 3.42 cm2
Calc EF: 39.9 %
MV M vel: 4.71 m/s
MV Peak grad: 88.7 mmHg
Radius: 0.5 cm
S' Lateral: 3 cm
Single Plane A2C EF: 39.2 %
Single Plane A4C EF: 42.9 %
Weight: 2574.97 oz

## 2020-07-23 LAB — TROPONIN I (HIGH SENSITIVITY)
Troponin I (High Sensitivity): 221 ng/L (ref <18)
Troponin I (High Sensitivity): 895 ng/L (ref <18)
Troponin I (High Sensitivity): 2058 ng/L (ref <18)
Troponin I (High Sensitivity): 1762 ng/L (ref <18)

## 2020-07-23 LAB — CBC WITH DIFFERENTIAL/PLATELET
Abs Immature Granulocytes: 0.06 10*3/uL (ref 0.00–0.07)
Basophils Absolute: 0.1 10*3/uL (ref 0.0–0.1)
Basophils Relative: 1 %
Eosinophils Absolute: 0.2 10*3/uL (ref 0.0–0.5)
Eosinophils Relative: 2 %
HCT: 45 % (ref 36.0–46.0)
Hemoglobin: 14.1 g/dL (ref 12.0–15.0)
Immature Granulocytes: 1 %
Lymphocytes Relative: 19 %
Lymphs Abs: 1.9 10*3/uL (ref 0.7–4.0)
MCH: 31.6 pg (ref 26.0–34.0)
MCHC: 31.3 g/dL (ref 30.0–36.0)
MCV: 100.9 fL — ABNORMAL HIGH (ref 80.0–100.0)
Monocytes Absolute: 0.8 10*3/uL (ref 0.1–1.0)
Monocytes Relative: 8 %
Neutro Abs: 7.1 10*3/uL (ref 1.7–7.7)
Neutrophils Relative %: 69 %
Platelets: 140 10*3/uL — ABNORMAL LOW (ref 150–400)
RBC: 4.46 MIL/uL (ref 3.87–5.11)
RDW: 14.5 % (ref 11.5–15.5)
WBC: 10.1 10*3/uL (ref 4.0–10.5)
nRBC: 0 % (ref 0.0–0.2)

## 2020-07-23 LAB — RESP PANEL BY RT-PCR (FLU A&B, COVID) ARPGX2
Influenza A by PCR: NEGATIVE
Influenza B by PCR: NEGATIVE
SARS Coronavirus 2 by RT PCR: NEGATIVE

## 2020-07-23 LAB — COMPREHENSIVE METABOLIC PANEL
ALT: 12 U/L (ref 0–44)
AST: 22 U/L (ref 15–41)
Albumin: 3.7 g/dL (ref 3.5–5.0)
Alkaline Phosphatase: 57 U/L (ref 38–126)
Anion gap: 11 (ref 5–15)
BUN: 14 mg/dL (ref 8–23)
CO2: 25 mmol/L (ref 22–32)
Calcium: 10 mg/dL (ref 8.9–10.3)
Chloride: 104 mmol/L (ref 98–111)
Creatinine, Ser: 0.95 mg/dL (ref 0.44–1.00)
GFR, Estimated: 55 mL/min — ABNORMAL LOW (ref >60)
Glucose, Bld: 140 mg/dL — ABNORMAL HIGH (ref 70–99)
Potassium: 3.9 mmol/L (ref 3.5–5.1)
Sodium: 140 mmol/L (ref 135–145)
Total Bilirubin: 1 mg/dL (ref 0.3–1.2)
Total Protein: 6.1 g/dL — ABNORMAL LOW (ref 6.5–8.1)

## 2020-07-23 LAB — BRAIN NATRIURETIC PEPTIDE: B Natriuretic Peptide: 517.5 pg/mL — ABNORMAL HIGH (ref 0.0–100.0)

## 2020-07-23 LAB — TSH: TSH: 3.658 u[IU]/mL (ref 0.350–4.500)

## 2020-07-23 LAB — APTT: aPTT: 37 seconds — ABNORMAL HIGH (ref 24–36)

## 2020-07-23 MED ORDER — CRANBERRY 425 MG PO CAPS: 425.0000 mg | ORAL_CAPSULE | Freq: Every day | ORAL | Status: DC

## 2020-07-23 MED ORDER — PSYLLIUM 95 % PO PACK
1.0000 | PACK | Freq: Every day | ORAL | Status: DC | PRN
  Filled 2020-07-23: qty 1

## 2020-07-23 MED ORDER — CLOPIDOGREL BISULFATE 75 MG PO TABS
75.0000 mg | ORAL_TABLET | Freq: Every day | ORAL | Status: DC
  Administered 2020-07-23 – 2020-07-27 (×5): 75 mg via ORAL
  Filled 2020-07-23 (×5): qty 1

## 2020-07-23 MED ORDER — ONDANSETRON HCL 4 MG/2ML IJ SOLN: 4.0000 mg | Freq: Four times a day (QID) | INTRAMUSCULAR | Status: DC | PRN

## 2020-07-23 MED ORDER — TIMOLOL MALEATE 0.5 % OP SOLN
1.0000 [drp] | Freq: Two times a day (BID) | OPHTHALMIC | Status: DC
  Administered 2020-07-23 – 2020-07-27 (×7): 1 [drp] via OPHTHALMIC
  Filled 2020-07-23 (×2): qty 5

## 2020-07-23 MED ORDER — FONDAPARINUX SODIUM 2.5 MG/0.5ML ~~LOC~~ SOLN
2.5000 mg | SUBCUTANEOUS | Status: DC
  Administered 2020-07-23: 2.5 mg via SUBCUTANEOUS
  Filled 2020-07-23: qty 0.5

## 2020-07-23 MED ORDER — TIOTROPIUM BROMIDE MONOHYDRATE 18 MCG IN CAPS: 18.0000 ug | ORAL_CAPSULE | Freq: Every day | RESPIRATORY_TRACT | Status: DC

## 2020-07-23 MED ORDER — LEVOTHYROXINE SODIUM 50 MCG PO TABS
50.0000 ug | ORAL_TABLET | Freq: Every day | ORAL | Status: DC
  Administered 2020-07-24 – 2020-07-27 (×4): 50 ug via ORAL
  Filled 2020-07-23 (×4): qty 1

## 2020-07-23 MED ORDER — ADULT MULTIVITAMIN W/MINERALS CH
1.0000 | ORAL_TABLET | Freq: Every day | ORAL | Status: DC
  Administered 2020-07-23 – 2020-07-27 (×5): 1 via ORAL
  Filled 2020-07-23 (×4): qty 1

## 2020-07-23 MED ORDER — ARGATROBAN 50 MG/50ML IV SOLN
1.8000 ug/kg/min | INTRAVENOUS | Status: DC
  Administered 2020-07-23: 0.75 ug/kg/min via INTRAVENOUS
  Administered 2020-07-23: 1 ug/kg/min via INTRAVENOUS
  Administered 2020-07-24 (×2): 1.8 ug/kg/min via INTRAVENOUS
  Administered 2020-07-24: 1.2 ug/kg/min via INTRAVENOUS
  Administered 2020-07-25: 1.8 ug/kg/min via INTRAVENOUS
  Filled 2020-07-23 (×5): qty 50

## 2020-07-23 MED ORDER — FUROSEMIDE 10 MG/ML IJ SOLN
40.0000 mg | Freq: Two times a day (BID) | INTRAMUSCULAR | Status: DC
  Administered 2020-07-23 – 2020-07-24 (×3): 40 mg via INTRAVENOUS
  Filled 2020-07-23 (×4): qty 4

## 2020-07-23 MED ORDER — PSYLLIUM 95 % PO PACK
1.0000 | PACK | ORAL | Status: DC
  Administered 2020-07-24 – 2020-07-27 (×2): 1 via ORAL
  Filled 2020-07-23 (×2): qty 1

## 2020-07-23 MED ORDER — ACETAMINOPHEN 650 MG RE SUPP: 650.0000 mg | Freq: Four times a day (QID) | RECTAL | Status: DC | PRN

## 2020-07-23 MED ORDER — SODIUM CHLORIDE 0.9% FLUSH
3.0000 mL | Freq: Two times a day (BID) | INTRAVENOUS | Status: DC
  Administered 2020-07-23 – 2020-07-25 (×3): 3 mL via INTRAVENOUS
  Administered 2020-07-26: 10 mL via INTRAVENOUS
  Administered 2020-07-26: 3 mL via INTRAVENOUS

## 2020-07-23 MED ORDER — HYDRALAZINE HCL 20 MG/ML IJ SOLN: 5.0000 mg | INTRAMUSCULAR | Status: DC | PRN

## 2020-07-23 MED ORDER — DOCUSATE SODIUM 100 MG PO CAPS
100.0000 mg | ORAL_CAPSULE | Freq: Two times a day (BID) | ORAL | Status: DC
  Administered 2020-07-23 – 2020-07-27 (×9): 100 mg via ORAL
  Filled 2020-07-23 (×9): qty 1

## 2020-07-23 MED ORDER — FUROSEMIDE 10 MG/ML IJ SOLN
20.0000 mg | Freq: Once | INTRAMUSCULAR | Status: AC
  Administered 2020-07-23: 20 mg via INTRAVENOUS
  Filled 2020-07-23: qty 2

## 2020-07-23 MED ORDER — LISINOPRIL 20 MG PO TABS
20.0000 mg | ORAL_TABLET | Freq: Once | ORAL | Status: AC
  Administered 2020-07-23: 20 mg via ORAL
  Filled 2020-07-23: qty 1

## 2020-07-23 MED ORDER — LISINOPRIL 20 MG PO TABS
20.0000 mg | ORAL_TABLET | Freq: Every day | ORAL | Status: DC
  Administered 2020-07-23 – 2020-07-24 (×2): 20 mg via ORAL
  Filled 2020-07-23 (×2): qty 1

## 2020-07-23 MED ORDER — BISACODYL 5 MG PO TBEC: 5.0000 mg | DELAYED_RELEASE_TABLET | Freq: Every day | ORAL | Status: DC | PRN

## 2020-07-23 MED ORDER — TRAZODONE HCL 50 MG PO TABS
25.0000 mg | ORAL_TABLET | Freq: Every evening | ORAL | Status: DC | PRN
  Administered 2020-07-26: 25 mg via ORAL
  Filled 2020-07-23: qty 1

## 2020-07-23 MED ORDER — OXYCODONE HCL 5 MG PO TABS: 5.0000 mg | ORAL_TABLET | ORAL | Status: DC | PRN

## 2020-07-23 MED ORDER — SODIUM CHLORIDE 0.9% FLUSH
10.0000 mL | INTRAVENOUS | Status: DC | PRN
  Administered 2020-07-27: 10 mL

## 2020-07-23 MED ORDER — MORPHINE SULFATE (PF) 2 MG/ML IV SOLN: 2.0000 mg | INTRAVENOUS | Status: DC | PRN

## 2020-07-23 MED ORDER — ACETAMINOPHEN 325 MG PO TABS
650.0000 mg | ORAL_TABLET | Freq: Four times a day (QID) | ORAL | Status: DC | PRN
  Administered 2020-07-25 – 2020-07-27 (×2): 650 mg via ORAL
  Filled 2020-07-23 (×2): qty 2

## 2020-07-23 MED ORDER — ONDANSETRON HCL 4 MG PO TABS: 4.0000 mg | ORAL_TABLET | Freq: Four times a day (QID) | ORAL | Status: DC | PRN

## 2020-07-23 MED ORDER — SERTRALINE HCL 50 MG PO TABS
25.0000 mg | ORAL_TABLET | Freq: Every day | ORAL | Status: DC
  Administered 2020-07-23 – 2020-07-27 (×5): 25 mg via ORAL
  Filled 2020-07-23 (×5): qty 1

## 2020-07-23 MED ORDER — UMECLIDINIUM BROMIDE 62.5 MCG/INH IN AEPB
1.0000 | INHALATION_SPRAY | Freq: Every day | RESPIRATORY_TRACT | Status: DC
  Administered 2020-07-24 – 2020-07-27 (×4): 1 via RESPIRATORY_TRACT
  Filled 2020-07-23: qty 7

## 2020-07-23 MED ORDER — ATORVASTATIN CALCIUM 10 MG PO TABS
10.0000 mg | ORAL_TABLET | Freq: Every day | ORAL | Status: DC
  Administered 2020-07-23: 10 mg via ORAL
  Filled 2020-07-23: qty 1

## 2020-07-23 MED ORDER — POLYETHYLENE GLYCOL 3350 17 G PO PACK: 17.0000 g | PACK | Freq: Every day | ORAL | Status: DC | PRN

## 2020-07-23 MED ORDER — SODIUM CHLORIDE 0.9% FLUSH
10.0000 mL | Freq: Two times a day (BID) | INTRAVENOUS | Status: DC
  Administered 2020-07-23: 10 mL
  Administered 2020-07-23: 20 mL
  Administered 2020-07-24 – 2020-07-27 (×6): 10 mL

## 2020-07-23 MED ORDER — ROPINIROLE HCL 1 MG PO TABS
1.0000 mg | ORAL_TABLET | Freq: Every day | ORAL | Status: DC
  Administered 2020-07-23 – 2020-07-26 (×4): 1 mg via ORAL
  Filled 2020-07-23 (×5): qty 1

## 2020-07-23 NOTE — ED Provider Notes (Signed)
[facility] EMERGENCY DEPARTMENT Provider Note   CSN: [id number] Arrival date & time: 07/23/20  0405     History Chief Complaint  Patient presents with  . Respiratory Distress    Makayla Fisher is a 87 y.o. female with a history of follicular large cell lymphoma in remission, HOH, iron deficiency anemia, GERD, migraines, CVA, CAD with stent/CABG in 2009, colostomy stricture s/p colostomy revision who presents the emergency department by EMS from [facility] with a chief complaint of shortness of breath.  The patient endorses shortness of breath for the last few days.  She states that she has been having nasal congestion postnasal drip that is made it difficult for her to breathe at times.  Tonight, shortness of breath suddenly worsened at 2:45 and EMS was called.  Patient also endorsed a headache.  When EMS arrived, oxygen saturation was 84% and patient was unable to speak complete sentences due to shortness of breath.  She was also noted to be hypertensive at 212/120 .  EKG with normal sinus rhythm and heart rate ~100.  The patient was placed on a nonrebreather and oxygen saturation increased to 96%.  The patient notes that shortness of breath has been worse with exertion.  She denies increasing symptoms with laying flat.  She denies chest pain, fever, chills, vomiting, increased ostomy output, leg swelling, palpitations, dizziness, lightheadedness.  She reports that she received Tylenol for her headache prior to arrival with improvement in her headache.  She reports that she is very nervous about her elevated blood pressure and about her breathing problems.  States that her mouth feels dry and she is very thirsty.  She has had compliance at her facility with her blood pressure medications.   The history is provided by the patient, medical records and the EMS personnel. No language interpreter was used.       Past Medical History:  Diagnosis Date  . GERD  (gastroesophageal reflux disease)   . Heart attack ([place]) 2009  . History of blood transfusion 2009  . Iron deficiency anemia due to chronic blood loss 08/07/2014  . NHL (non-Hodgkin's lymphoma) ([place]) 10/18/2013   finished chemo dec 2015, maintenanance retuxin    Patient Active Problem List   Diagnosis Date Noted  . Obesity 11/28/2019  . Hx of migraines 11/28/2019  . GERD (gastroesophageal reflux disease) 11/28/2019  . Stroke-like symptoms s/p IV tPA 11/28/2019  . Stroke (cerebrum) ([place]) 11/27/2019  . TIA (transient ischemic attack) 04/30/2017  . HLD (hyperlipidemia) 04/30/2017  . Hx of CABG x 3/stent 04/30/2017  . Bilateral carotid artery stenosis 04/30/2017  . Non Hodgkin's lymphoma ([place]) 04/30/2017  . Hypothyroidism 04/30/2017  . Hypertension 04/30/2017  . S/P colostomy ([place]) 04/30/2017  . Anxiety disorder 04/30/2017  . Abnormal urinalysis 04/30/2017  . CAP (community acquired pneumonia) 12/13/2016  . Acute lower UTI 12/13/2016  . Glaucoma 12/13/2016  . Colostomy in place [facility]) 12/13/2016  . Vitamin D deficiency 10/20/2016  . Follicular lymphoma grade IIIa of intra-abdominal lymph nodes ([place]) 04/22/2016  . Iron deficiency anemia due to chronic blood loss 08/07/2014  . HTN (hypertension) 07/15/2014  . Unilateral carotid artery disease ([place]) 07/15/2014  . Bradycardia 07/15/2014  . Colon stricture ([place]) 06/13/2014  . 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[place]) 06/12/2014  . Preop cardiovascular exam 04/08/2014  . NHL (non-Hodgkin's lymphoma) ([place]) 10/18/2013  . 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[place]) 07/24/2013    Past Surgical History:  Procedure Laterality Date  . c section  1947, 1963  . CARDIAC SURGERY  2009  . CHOLECYSTECTOMY  1973  . COLON SURGERY  2014   Colostomy  . COLOSTOMY REVISION N/A 06/12/2014   Procedure: COLOSTOMY REVISION;  Surgeon: [name], MD;  Location: [facility] ORS;  Service: General;  Laterality: N/A;  . CORONARY ANGIOPLASTY WITH STENT PLACEMENT  2009  . CORONARY ARTERY BYPASS  GRAFT  aug 2009    x3  . ESOPHAGOGASTRODUODENOSCOPY  2013  . EYE SURGERY Bilateral  10 years ago   lens replacments cataracts  . right chest pac  june 2015     OB History   No obstetric history on file.     Family History  Problem Relation Age of Onset  . Diverticulitis Mother   . Heart attack Mother   . Ulcers Father     Social History   Tobacco Use  . Smoking status: Former Smoker    Packs/day: 1.00    Years: 25.00    Pack years: 25.00    Types: Cigarettes    Start date: 05/16/1948    Quit date: 05/17/1963    Years since quitting: 57.2  . Smokeless tobacco: Never Used  . Tobacco comment: quit smoking 50 years ago  Vaping Use  . Vaping Use: Never used  Substance Use Topics  . Alcohol use: No    Alcohol/week: 0.0 standard drinks  . Drug use: No    Home Medications Prior to Admission medications   Medication Sig Start Date End Date Taking? Authorizing Provider  atorvastatin (LIPITOR) 10 MG tablet Take 1 tablet (10 mg total) by mouth daily. Patient taking differently: Take 10 mg by mouth at bedtime. 11/29/19  Yes Donzetta Starch, NP  Calcium Carb-Cholecalciferol (CALCIUM 600+D3 PO) Take 1 tablet by mouth at bedtime.   Yes [provider]  Cholecalciferol (VITAMIN D3 SUPER STRENGTH) 50 MCG (2000 UT) CAPS Take 4,000 Units by mouth daily.    Yes [provider]  clopidogrel (PLAVIX) 75 MG tablet Take 1 tablet (75 mg total) by mouth daily. 11/29/19  Yes Donzetta Starch, NP  Cranberry 425 MG CAPS Take 425 mg by mouth daily.    Yes [provider]  levothyroxine (SYNTHROID, LEVOTHROID) 50 MCG tablet Take 50 mcg by mouth daily before breakfast.  10/08/15  Yes [provider]  lidocaine-prilocaine (EMLA) cream Apply 1 application topically See admin instructions. Apply to port-a-cath one hour prior to procedure 09/16/19  Yes [provider]  lisinopril (PRINIVIL,ZESTRIL) 20 MG tablet Take 1 tablet (20 mg total) by mouth daily. 05/10/18  Yes  [name], MD  Multiple Vitamin (DAILY VITE PO) Take 1 tablet by mouth See admin instructions. Daily at 12 noon   Yes [provider]  Multiple Vitamins-Minerals (PRESERVISION AREDS PO) Take 1 tablet by mouth in the morning and at bedtime.   Yes [provider]  nystatin cream (MYCOSTATIN) Apply 1 application topically every 12 (twelve) hours as needed (to rash between the legs and groin).   Yes [provider]  psyllium (METAMUCIL) 58.6 % packet Take 1 packet by mouth every Monday, Wednesday, and Friday. [place] 11/29/19  Yes Donzetta Starch, NP  psyllium (METAMUCIL) 58.6 % packet Take 1 packet by mouth daily as needed (constipation).   Yes [provider]  rOPINIRole (REQUIP) 1 MG tablet Take 1 mg by mouth at bedtime.   Yes [provider]  sertraline (ZOLOFT) 25 MG tablet Take 25 mg by mouth daily.   Yes [provider]  SPIRIVA HANDIHALER 18 MCG inhalation capsule  Place 18 mcg into inhaler and inhale daily.  07/23/15  Yes [provider]  timolol (TIMOPTIC) 0.5 % ophthalmic solution Place 1 drop into both eyes 2 (two) times daily. Affected eye(s)   Yes [provider]  NEOMYCIN-POLYMYXIN-HYDROCORTISONE (CORTISPORIN) 1 % SOLN OTIC solution Apply 1-2 drops to toe BID after soaking Patient not taking: Reported on 07/23/2020 10/31/19   Tyson Dense T, DPM    Allergies    Epipen [epinephrine hcl (nasal)], Levaquin [levofloxacin in d5w], Chlorhexidine gluconate, Heparin, and Sulfa antibiotics  Review of Systems   Review of Systems  Constitutional: Negative for activity change, chills, diaphoresis, fatigue and fever.  HENT: Positive for congestion and postnasal drip.   Respiratory: Positive for shortness of breath.   Cardiovascular: Negative for chest pain and palpitations.  Gastrointestinal: Negative for abdominal pain, nausea and vomiting.  Genitourinary: Negative for dysuria.  Musculoskeletal: Negative for back pain, joint  swelling, myalgias and neck stiffness.  Skin: Negative for rash.  Allergic/Immunologic: Negative for immunocompromised state.  Neurological: Negative for headaches.  Psychiatric/Behavioral: Negative for confusion.    Physical Exam Updated Vital Signs BP (!) 179/71   Pulse 82   Temp 98.5 F (36.9 C) (Oral)   Resp (!) 27   SpO2 96%   Physical Exam Vitals and nursing note reviewed.  Constitutional:      General: She is not in acute distress.    Appearance: She is not ill-appearing, toxic-appearing or diaphoretic.     Comments: Nonrebreather in place  HENT:     Head: Normocephalic.     Mouth/Throat:     Comments: Lips are dry Eyes:     Extraocular Movements: Extraocular movements intact.     Conjunctiva/sclera: Conjunctivae normal.     Pupils: Pupils are equal, round, and reactive to light.  Cardiovascular:     Rate and Rhythm: Normal rate and regular rhythm.     Heart sounds: No murmur heard. No friction rub. No gallop.   Pulmonary:     Effort: Pulmonary effort is normal. No respiratory distress.     Breath sounds: No stridor. No wheezing, rhonchi or rales.  Chest:     Chest wall: No tenderness.  Abdominal:     General: There is no distension.     Palpations: Abdomen is soft.     Comments: Ostomy noted in the left lower abdomen.  No tenderness palpation around the ostomy site.  Minimal output in ostomy bag.  Stoma without bleeding or erythema.  Musculoskeletal:        General: No tenderness.     Cervical back: Neck supple.     Right lower leg: No edema.     Left lower leg: No edema.  Skin:    General: Skin is warm.     Capillary Refill: Capillary refill takes less than 2 seconds.     Findings: No rash.  Neurological:     General: No focal deficit present.     Mental Status: She is alert.  Psychiatric:        Mood and Affect: Mood is anxious.        Behavior: Behavior normal.     ED Results / Procedures / Treatments   Labs (all labs ordered are listed, but  only abnormal results are displayed) Labs Reviewed  CBC WITH DIFFERENTIAL/PLATELET - Abnormal; Notable for the following components:      Result Value   MCV 100.9 (*)    Platelets 140 (*)    All other components within  normal limits  COMPREHENSIVE METABOLIC PANEL - Abnormal; Notable for the following components:   Glucose, Bld 140 (*)    Total Protein 6.1 (*)    GFR, Estimated 55 (*)    All other components within normal limits  BRAIN NATRIURETIC PEPTIDE - Abnormal; Notable for the following components:   B Natriuretic Peptide 517.5 (*)    All other components within normal limits  TROPONIN I (HIGH SENSITIVITY) - Abnormal; Notable for the following components:   Troponin I (High Sensitivity) 221 (*)    All other components within normal limits  RESP PANEL BY RT-PCR (FLU A&B, COVID) ARPGX2  TROPONIN I (HIGH SENSITIVITY)    EKG None  Radiology DG Chest Portable 1 View  Result Date: 07/23/2020 CLINICAL DATA:  Dyspnea EXAM: PORTABLE CHEST 1 VIEW COMPARISON:  05/31/2017 FINDINGS: Right internal jugular central venous catheter tip is seen within the superior vena cava. Lung volumes are small and pulmonary insufflation has slightly diminished since prior examination. Superimposed asymmetric patchy pulmonary infiltrates have developed within the right lung and left lung base, likely infectious or inflammatory in nature. No pneumothorax or pleural effusion. Coronary artery bypass grafting has been performed. Cardiac size within normal limits. Atherosclerotic calcification noted within a mildly tortuous thoracic aorta. No acute bone abnormality. IMPRESSION: Interval development of patchy bilateral asymmetric pulmonary infiltrates, likely infectious or inflammatory in nature. Mild pulmonary hypoinflation.  There Electronically Signed   By: Fidela Salisbury MD   On: 07/23/2020 04:52    Procedures .Critical Care Performed by: Joanne Gavel, PA-C Authorized by: Joanne Gavel, PA-C   Critical  care provider statement:    Critical care time (minutes):  40   Critical care time was exclusive of:  Separately billable procedures and treating other patients and teaching time   Critical care was necessary to treat or prevent imminent or life-threatening deterioration of the following conditions:  Respiratory failure   Critical care was time spent personally by me on the following activities:  Ordering and performing treatments and interventions, ordering and review of laboratory studies, ordering and review of radiographic studies, pulse oximetry, re-evaluation of patient's condition, review of old charts, examination of patient, obtaining history from patient or surrogate, evaluation of patient's response to treatment and development of treatment plan with patient or surrogate   I assumed direction of critical care for this patient from another provider in my specialty: no       Medications Ordered in ED Medications  sodium chloride flush (NS) 0.9 % injection 10-40 mL (has no administration in time range)  sodium chloride flush (NS) 0.9 % injection 10-40 mL (has no administration in time range)  furosemide (LASIX) injection 20 mg (has no administration in time range)  lisinopril (ZESTRIL) tablet 20 mg (20 mg Oral Given 07/23/20 0600)    ED Course  I have reviewed the triage vital signs and the nursing notes.  Pertinent labs & imaging results that were available during my care of the patient were reviewed by me and considered in my medical decision making (see chart for details).    MDM Rules/Calculators/A&P                          87 year old female with a history of follicular large cell lymphoma in remission, HOH, iron deficiency anemia, GERD, migraines, CVA, CAD with stent/CABG in 2009, colostomy stricture s/p colostomy revision who presents the emergency department from [facility] with shortness of breath.  Patient has  been having shortness of breath for the last few days, but  symptoms acutely worsened tonight.  She was noted to be markedly hypertensive at 212/120 on arrival and oxygen saturation was at 84% on room air.  Hypertensive in the ER.  She is hypoxic.  Mild tachypnea.  The patient was seen and independently evaluated by Dr. Dayna Barker, attending physician.  Labs been reviewed and independently interpreted by me.  Chest x-ray with interval development of patchy bilateral asymmetric pulmonary infiltrates.  Radiology suggests infectious versus inflammatory nature.  There is mild pulmonary hyperinflation.  On my read, there is some suspicion for vascular edema, especially in the setting of markedly elevated blood pressure could consider flash pulmonary edema or acute CHF, especially since patient's BNP is elevated.  Creatinine is normal.  No leukocytosis.  Troponin is pending.  Since patient has a new oxygen requirement, she will require admission.  Lasix has been initiated in the ER.  Patient's home lisinopril has been administered.  She adamantly denies chest pain.  Consult to the hospitalist service and Dr. Alcario Drought will accept the patient for admission.  The patient appears reasonably stabilized for admission considering the current resources, flow, and capabilities available in the ED at this time, and I doubt any other [facility] requiring further screening and/or treatment in the ED prior to admission.  Final Clinical Impression(s) / ED Diagnoses Final diagnoses:  Acute respiratory failure with hypoxia ([place])  Acute congestive heart failure, unspecified heart failure type [facility])    Rx / DC Orders ED Discharge Orders    None       Joanne Gavel, PA-C 07/23/20 0651    Mesner, Corene Cornea, MD 07/24/20 [phone]

## 2020-07-23 NOTE — Progress Notes (Signed)
ANTICOAGULATION CONSULT NOTE - Initial Consult  Pharmacy Consult for Argatroban Indication: chest pain/ACS  Allergies  Allergen Reactions  . Epipen [Epinephrine Hcl (Nasal)] Palpitations  . Levaquin [Levofloxacin In D5w] Other (See Comments)    Weakness- "Allergic," per MAR  . Chlorhexidine Gluconate Other (See Comments)    Pt declined use and suspects she may have an intolerance  . Heparin Other (See Comments)    "Alleregic," pre MAR- Maybe affected platelets, per patient  . Sulfa Antibiotics Rash    Patient Measurements: Weight 73 kg   Vital Signs: Temp: 98.2 F (36.8 C) (03/31 0750) Temp Source: Oral (03/31 0750) BP: 147/62 (03/31 1130) Pulse Rate: 69 (03/31 1130)  Labs: Recent Labs    07/23/20 0500 07/23/20 0629 07/23/20 1022  HGB 14.1  --   --   HCT 45.0  --   --   PLT 140*  --   --   CREATININE 0.95  --   --   TROPONINIHS 221* 895* 2,058*    CrCl cannot be calculated (Unknown ideal weight.).   Medical History: Past Medical History:  Diagnosis Date  . GERD (gastroesophageal reflux disease)   . Heart attack ([place]) 2009  . Heparin allergy 07/23/2020  . History of blood transfusion 2009  . Iron deficiency anemia due to chronic blood loss 08/07/2014  . NHL (non-Hodgkin's lymphoma) ([place]) 10/18/2013   finished chemo dec 2015, maintenanance retuxin    Medications:  (Not in a hospital admission)   Assessment: 24 YOF who presents with shortness of breath with rising troponins. Pharmacy consulted to start argatroban in the setting of heparin allergy.   Of note, patient received one dose of prophylactic Arixtra this AM. H/H wnl, Plt low, LFTs wnl   Goal of Therapy:  aPTT 50-90 seconds Monitor platelets by anticoagulation protocol: Yes   Plan:  -Start argatroban 0.75 mcg/kg/min -F/u 4 hr aPTT -Monitor closely for s/s of bleeding   Albertina Parr, PharmD., BCPS, BCCCP Clinical Pharmacist Please refer to [facility] for unit-specific pharmacist

## 2020-07-23 NOTE — ED Triage Notes (Signed)
Pt arrived via [facility] for respiratory distress from [facility]. Pt reports waking up short of breath with a headache at approximately 2:45. Initial 02 sat was low 80s per EMS, unable to speak in full sentences. Placed on NR enroute. Hypertensive for EMS, history of HTN.

## 2020-07-23 NOTE — H&P (Addendum)
History and Physical    Makayla Fisher QQV:[id number] DOB: December 02, 1922 DOA: 07/23/2020  PCP: [name], DO Consultants:  Marin Olp - oncology; [facility]; [facility] - cardiology; Leonie Man - neurology Patient coming from: [facility]; NOK: Makayla Fisher, [phone]; Niece, Makayla Fisher, [phone]   Chief Complaint: SOB  HPI: Makayla Fisher is a 87 y.o. female with medical history significant of NHL; CAD; and GERD presenting with SOB.  No cognitive impairment, very independent at baseline.  Hey called this AM - BP up, anxious, SOB.  She does tend to get very anxious.  She had SOB yesterday before bed but it acutely worsened overnight.  She went to bed early not feeling well, had a headache.  She feels better now, improved SOB.  She feels tired, but didn't sleep at all.  She has not been coughing.  Her BP is usually fairly well controlled.  She did not have chest pain at all.     ED Course:  Carryover, per Dr. Alcario Drought:  87 yo F with no h/o pulmonary pathology. In with SOB for past few days. EMS noted BP 212/120. Been compliant with meds at [facility]. Suspicion of flash pulm edema though CXR being read as infectious vs inflamatory. BNP elevated. On 2L [place]. Got home meds now 178/90. 20 lasix ordered  Review of Systems: As per HPI; otherwise review of systems reviewed and negative.   Ambulatory Status:  Ambulates with a walker  COVID Vaccine Status:  Complete  Past Medical History:  Diagnosis Date  . GERD (gastroesophageal reflux disease)   . Heart attack ([place]) 2009  . Heparin allergy 07/23/2020  . History of blood transfusion 2009  . Iron deficiency anemia due to chronic blood loss 08/07/2014  . NHL (non-Hodgkin's lymphoma) ([place]) 10/18/2013   finished chemo dec 2015, maintenanance retuxin    Past Surgical History:  Procedure Laterality Date  . c section  1947, 1963  . CARDIAC SURGERY  2009  . CHOLECYSTECTOMY  1973  . COLON SURGERY  2014    Colostomy  . COLOSTOMY REVISION N/A 06/12/2014   Procedure: COLOSTOMY REVISION;  Surgeon: [name], MD;  Location: [facility];  Service: General;  Laterality: N/A;  . CORONARY ANGIOPLASTY WITH STENT PLACEMENT  2009  . CORONARY ARTERY BYPASS GRAFT  aug 2009    x3  . ESOPHAGOGASTRODUODENOSCOPY  2013  . EYE SURGERY Bilateral  10 years ago   lens replacments cataracts  . right chest pac  june 2015    Social History   Socioeconomic History  . Marital status: Widowed    Spouse name: Not on file  . Number of children: Not on file  . Years of education: Not on file  . Highest education level: Not on file  Occupational History  . Not on file  Tobacco Use  . Smoking status: Former Smoker    Packs/day: 1.00    Years: 25.00    Pack years: 25.00    Types: Cigarettes    Start date: 05/16/1948    Quit date: 05/17/1963    Years since quitting: 57.2  . Smokeless tobacco: Never Used  . Tobacco comment: quit smoking 50 years ago  Vaping Use  . Vaping Use: Never used  Substance and Sexual Activity  . Alcohol use: No    Alcohol/week: 0.0 standard drinks  . Drug use: No  . Sexual activity: Not on file  Other Topics Concern  . Not on file  Social History Narrative  . Not on file  Social Determinants of Health   Financial Resource Strain: Not on file  Food Insecurity: Not on file  Transportation Needs: Not on file  Physical Activity: Not on file  Stress: Not on file  Social Connections: Not on file  Intimate Partner Violence: Not on file    Allergies  Allergen Reactions  . Epipen [Epinephrine Hcl (Nasal)] Palpitations  . Levaquin [Levofloxacin In D5w] Other (See Comments)    Weakness- "Allergic," per MAR  . Chlorhexidine Gluconate Other (See Comments)    Pt declined use and suspects she may have an intolerance  . Heparin Other (See Comments)    "Alleregic," pre MAR- Maybe affected platelets, per patient  . Sulfa Antibiotics Rash    Family History  Problem Relation Age of  Onset  . Diverticulitis Mother   . Heart attack Mother   . Ulcers Father     Prior to Admission medications   Medication Sig Start Date End Date Taking? Authorizing Provider  atorvastatin (LIPITOR) 10 MG tablet Take 1 tablet (10 mg total) by mouth daily. Patient taking differently: Take 10 mg by mouth at bedtime. 11/29/19  Yes Donzetta Starch, NP  Calcium Carb-Cholecalciferol (CALCIUM 600+D3 PO) Take 1 tablet by mouth at bedtime.   Yes [provider]  Cholecalciferol (VITAMIN D3 SUPER STRENGTH) 50 MCG (2000 UT) CAPS Take 4,000 Units by mouth daily.    Yes [provider]  clopidogrel (PLAVIX) 75 MG tablet Take 1 tablet (75 mg total) by mouth daily. 11/29/19  Yes Donzetta Starch, NP  Cranberry 425 MG CAPS Take 425 mg by mouth daily.    Yes [provider]  levothyroxine (SYNTHROID, LEVOTHROID) 50 MCG tablet Take 50 mcg by mouth daily before breakfast.  10/08/15  Yes [provider]  lidocaine-prilocaine (EMLA) cream Apply 1 application topically See admin instructions. Apply to port-a-cath one hour prior to procedure 09/16/19  Yes [provider]  lisinopril (PRINIVIL,ZESTRIL) 20 MG tablet Take 1 tablet (20 mg total) by mouth daily. 05/10/18  Yes [name], MD  Multiple Vitamin (DAILY VITE PO) Take 1 tablet by mouth See admin instructions. Daily at 12 noon   Yes [provider]  Multiple Vitamins-Minerals (PRESERVISION AREDS PO) Take 1 tablet by mouth in the morning and at bedtime.   Yes [provider]  nystatin cream (MYCOSTATIN) Apply 1 application topically every 12 (twelve) hours as needed (to rash between the legs and groin).   Yes [provider]  psyllium (METAMUCIL) 58.6 % packet Take 1 packet by mouth every Monday, Wednesday, and Friday. ORANGE 11/29/19  Yes Donzetta Starch, NP  psyllium (METAMUCIL) 58.6 % packet Take 1 packet by mouth daily as needed (constipation).   Yes [provider]  rOPINIRole (REQUIP)  1 MG tablet Take 1 mg by mouth at bedtime.   Yes [provider]  sertraline (ZOLOFT) 25 MG tablet Take 25 mg by mouth daily.   Yes [provider]  SPIRIVA HANDIHALER 18 MCG inhalation capsule Place 18 mcg into inhaler and inhale daily.  07/23/15  Yes [provider]  timolol (TIMOPTIC) 0.5 % ophthalmic solution Place 1 drop into both eyes 2 (two) times daily. Affected eye(s)   Yes [provider]  NEOMYCIN-POLYMYXIN-HYDROCORTISONE (CORTISPORIN) 1 % SOLN OTIC solution Apply 1-2 drops to toe BID after soaking Patient not taking: Reported on 07/23/2020 10/31/19   Garrel Ridgel, [place]    Physical Exam: Vitals:   07/23/20 0600 07/23/20 0615 07/23/20 0745 07/23/20 0750  BP: (!) 178/90 (!) 179/71 (!) 181/72   Pulse: 93 82 68   Resp: 17 (!) 27 20   Temp:    98.2 F (36.8 C)  TempSrc:    Oral  SpO2: 94% 96% 96%      . General:  Appears calm and comfortable and is in NAD; very fatigued, on [place] O2 . Eyes:  PERRL, EOMI, normal lids, iris; chronic L eye visual impairment . ENT:  grossly normal hearing, lips & tongue, mmm; edentulous . Neck:  no LAD, masses or thyromegaly . Cardiovascular:  RRR, no m/r/g. No LE edema.  Marland Kitchen Respiratory:   Bibasilar rhonchi 1/2 up lung fields.  Normal respiratory effort. . Abdomen:  soft, NT, ND, colostomy in LLQ with significant abdominal surgical scars/deformation . Skin:  no rash or induration seen on limited exam . Musculoskeletal:  grossly normal tone BUE/BLE, good ROM, no bony abnormality . Psychiatric:  Blunted mood and affect, speech fluent and appropriate, AOx3 . Neurologic:  CN 2-12 grossly intact, moves all extremities in coordinated fashion    Radiological Exams on Admission: Independently reviewed - see discussion in A/P where applicable  DG Chest Portable 1 View  Result Date: 07/23/2020 CLINICAL DATA:  Dyspnea EXAM: PORTABLE CHEST 1 VIEW COMPARISON:  05/31/2017 FINDINGS: Right internal jugular central venous catheter  tip is seen within the superior vena cava. Lung volumes are small and pulmonary insufflation has slightly diminished since prior examination. Superimposed asymmetric patchy pulmonary infiltrates have developed within the right lung and left lung base, likely infectious or inflammatory in nature. No pneumothorax or pleural effusion. Coronary artery bypass grafting has been performed. Cardiac size within normal limits. Atherosclerotic calcification noted within a mildly tortuous thoracic aorta. No acute bone abnormality. IMPRESSION: Interval development of patchy bilateral asymmetric pulmonary infiltrates, likely infectious or inflammatory in nature. Mild pulmonary hypoinflation.  There Electronically Signed   By: Fidela Salisbury MD   On: 07/23/2020 04:52    EKG: Independently reviewed.  NSR with rate 91; nonspecific ST changes with no evidence of acute ischemia   Labs on Admission: I have personally reviewed the available labs and imaging studies at the time of the admission.  Pertinent labs:   Glucose 140 BUN 14/Creatinine 0.95/GFR 55 BNP 517.5; 119.8 in 2019 HS troponin 221, 895 WBC 10.1 Platelets 140 COVID/flu negative   Assessment/Plan Principal Problem:   Acute congestive heart failure (HCC) Active Problems:   HTN (hypertension)   Dyslipidemia   Hypothyroidism   Heparin allergy   Acute CHF, likely hypertensive crisis -Patient with known h/o chronic diastolic CHF (echo in 05/9935 with grade 2 diastolic dysfunction) presenting with worsening SOB and marked HTN -CXR consistent with pulmonary edema (read as infiltrates) -Elevated BNP compared to prior -With elevated BNP and abnl CXR, acute decompensated CHF seems probable as diagnosis -Will admit, as per the Emergency HF Mortality Risk Grade.  The patient has: pulmonary edema requiring new O2 therapy -Will request echocardiogram -Will continue Plavix -CHF order set utilized -Elevated troponin with significantly increased delta;  likely related to demand ischemia given the circumstances and lack of chest pain - will continue to trend -Cardiology consult - she is unlikely to be an appropriate candidate for intervention but will appreciate medication recommendations if not -Was given Lasix 40 mg x 1 in ER and will repeat with 40 mg IV BID -Continue [place] O2 for now -Normal kidney function at this time, will follow  HTN -Continue Lisinopril -Will also add prn hydralazine  HLD -Continue Lipitor  Hypothyroidism -Check TSH -Continue Synthroid at current dose for now  Heparin allergy -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 She has been fully vaccinated against COVID-19.   DVT prophylaxis:  Arixtra Code Status:  DNR - confirmed with family Family Communication: Niece was present throughout evaluation Disposition Plan:  The patient is from: ALF  Anticipated d/c is to: ALF, possibly with [facility] services  Anticipated d/c date will depend on clinical response to treatment, likely 2-4 days  Patient is currently: acutely ill Consults called: Cardiology; [facility] team; PT/OT; Nutrition; Pharmacy Admission status: Admit - It is my clinical opinion that admission to INPATIENT is reasonable and necessary because this patient will require at least 2 midnights in the hospital to treat this condition based on the medical complexity of the problems presented.  Given the aforementioned information, the predictability of an adverse outcome is felt to be significant.    Karmen Bongo MD [facility]   How to contact the [facility] [place] [facility] Attending or Consulting provider [place] or covering provider during after hours [place], for this patient?  1. Check the care team in [facility] and look for a) attending/consulting TRH provider listed and b) the [facility] team listed 2. Log  into [email or web address] and use [place]'s universal password to access. If you do not have the password, please contact the hospital operator. 3. Locate the [facility] provider you are looking for under [facility] and page to a number that you can be directly reached. 4. If you still have difficulty reaching the provider, please page the [facility] (Director on Call) for the Hospitalists listed on amion for assistance.   07/23/2020, 9:39 AM

## 2020-07-23 NOTE — Progress Notes (Signed)
OT Cancellation Note  Patient Details Name: Makayla Fisher MRN: [id number] DOB: [birth year]   Cancelled Treatment:    Reason Eval/Treat Not Completed: Patient declined, sleeping, had a long day and just got to the room.  Agreed to OT eval next date.    [name] D Sarahmarie Leavey 07/23/2020, 4:54 PM

## 2020-07-23 NOTE — Progress Notes (Signed)
[place] for Argatroban Indication: chest pain/ACS  Allergies  Allergen Reactions  . Epipen [Epinephrine Hcl (Nasal)] Palpitations  . Levaquin [Levofloxacin In D5w] Other (See Comments)    Weakness- "Allergic," per MAR  . Chlorhexidine Gluconate Other (See Comments)    Pt declined use and suspects she may have an intolerance  . Heparin Other (See Comments)    "Alleregic," pre MAR- Maybe affected platelets, per patient  . Sulfa Antibiotics Rash    Patient Measurements: Weight 73 kg   Vital Signs: Temp: 98.1 F (36.7 C) (03/31 1454) Temp Source: Oral (03/31 1454) BP: 131/87 (03/31 1454) Pulse Rate: 57 (03/31 1454)  Labs: Recent Labs    07/23/20 0500 07/23/20 0629 07/23/20 1022 07/23/20 1220 07/23/20 1838  HGB 14.1  --   --   --   --   HCT 45.0  --   --   --   --   PLT 140*  --   --   --   --   APTT  --   --   --   --  37*  CREATININE 0.95  --   --   --   --   TROPONINIHS 221* 895* 2,058* 1,762*  --     Estimated Creatinine Clearance: 31.6 mL/min (by C-G formula based on SCr of 0.95 mg/dL).   Medical History: Past Medical History:  Diagnosis Date  . GERD (gastroesophageal reflux disease)   . Heart attack ([place]) 2009  . Heparin allergy 07/23/2020  . History of blood transfusion 2009  . Iron deficiency anemia due to chronic blood loss 08/07/2014  . NHL (non-Hodgkin's lymphoma) ([place]) 10/18/2013   finished chemo dec 2015, maintenanance retuxin    Medications:  Medications Prior to Admission  Medication Sig Dispense Refill Last Dose  . atorvastatin (LIPITOR) 10 MG tablet Take 1 tablet (10 mg total) by mouth daily. (Patient taking differently: Take 10 mg by mouth at bedtime.) 30 tablet 2 07/22/2020 at Unknown time  . Calcium Carb-Cholecalciferol (CALCIUM 600+D3 PO) Take 1 tablet by mouth at bedtime.   07/22/2020 at Unknown time  . Cholecalciferol (VITAMIN D3 SUPER STRENGTH) 50 MCG (2000 UT) CAPS Take 4,000 Units by mouth daily.     07/22/2020 at Unknown time  . clopidogrel (PLAVIX) 75 MG tablet Take 1 tablet (75 mg total) by mouth daily. 30 tablet 2 07/22/2020 at Unknown time  . Cranberry 425 MG CAPS Take 425 mg by mouth daily.    07/22/2020 at Unknown time  . levothyroxine (SYNTHROID, LEVOTHROID) 50 MCG tablet Take 50 mcg by mouth daily before breakfast.    07/22/2020 at Unknown time  . lidocaine-prilocaine (EMLA) cream Apply 1 application topically See admin instructions. Apply to port-a-cath one hour prior to procedure   07/16/2020  . lisinopril (PRINIVIL,ZESTRIL) 20 MG tablet Take 1 tablet (20 mg total) by mouth daily. 30 tablet 3 07/22/2020 at Unknown time  . Multiple Vitamin (DAILY VITE PO) Take 1 tablet by mouth See admin instructions. Daily at 12 noon   07/22/2020 at Unknown time  . Multiple Vitamins-Minerals (PRESERVISION AREDS PO) Take 1 tablet by mouth in the morning and at bedtime.   07/22/2020 at Unknown time  . nystatin cream (MYCOSTATIN) Apply 1 application topically every 12 (twelve) hours as needed (to rash between the legs and groin).   unk  . psyllium (METAMUCIL) 58.6 % packet Take 1 packet by mouth every Monday, Wednesday, and Friday. ORANGE   07/22/2020 at Unknown time  .  psyllium (METAMUCIL) 58.6 % packet Take 1 packet by mouth daily as needed (constipation).   unk  . rOPINIRole (REQUIP) 1 MG tablet Take 1 mg by mouth at bedtime.   07/21/2020  . sertraline (ZOLOFT) 25 MG tablet Take 25 mg by mouth daily.   07/22/2020 at Unknown time  . SPIRIVA HANDIHALER 18 MCG inhalation capsule Place 18 mcg into inhaler and inhale daily.    07/22/2020 at Unknown time  . timolol (TIMOPTIC) 0.5 % ophthalmic solution Place 1 drop into both eyes 2 (two) times daily. Affected eye(s)   07/22/2020 at Unknown time    Assessment: 49 YOF who presents with shortness of breath with rising troponins. Pharmacy consulted to start argatroban in the setting of heparin allergy.   Of note, patient received one dose of prophylactic Arixtra this AM.  H/H wnl, Plt low, LFTs wnl   Initial aPTT subtherapeutic at 37 seconds on 0.75 mcg/kg/min  Goal of Therapy:  aPTT 50-90 seconds Monitor platelets by anticoagulation protocol: Yes   Plan: Increase argatroban gtt to 1 mcg/kg/min F/u 4 hour aPTT to confirm F/u cards plan  Bertis Ruddy, PharmD Clinical Pharmacist ED Pharmacist Phone # [phone] 07/23/2020 7:43 PM

## 2020-07-23 NOTE — ED Notes (Signed)
Pt resting comfortably in bed Pt does not appear in distress, respirations are even and non-labored  Remains on nasal canula 2L   Lights turned down for pt at rest, visitor remains at bedside

## 2020-07-23 NOTE — ED Notes (Signed)
Pt dentures are back at living facility, she would like to be switched to a pur[employer] diet

## 2020-07-23 NOTE — Progress Notes (Signed)
Initial Nutrition Assessment  DOCUMENTATION CODES:   Obesity unspecified  INTERVENTION:   -Magic cup BID with meals, each supplement provides 290 kcal and 9 grams of protein -MVI with minerals daily  NUTRITION DIAGNOSIS:   Increased nutrient needs related to chronic illness (CHF) as evidenced by estimated needs.  GOAL:   Patient will meet greater than or equal to 90% of their needs  MONITOR:   PO intake,Supplement acceptance,Labs,Weight trends,Skin,I & O's  REASON FOR ASSESSMENT:   Consult Assessment of nutrition requirement/status  ASSESSMENT:   Makayla Fisher is a 87 y.o. female with medical history significant of NHL; CAD; and GERD presenting with SOB.  No cognitive impairment, very independent at baseline.  Hey called this AM - BP up, anxious, SOB.  She does tend to get very anxious.  She had SOB yesterday before bed but it acutely worsened overnight.  She went to bed early not feeling well, had a headache.  She feels better now, improved SOB.  She feels tired, but didn't sleep at all.  She has not been coughing.  Her BP is usually fairly well controlled.  She did not have chest pain at all.  Pt admitted with CHF.   Pt unavailable at time of visit. Attempted to speak with pt via call to hospital room, however, unable to reach.   No meal completions available to assess at this time. Pt is currently on a dysphagia 1 diet.   Reviewed wt hx; pt has experienced a 8% wt loss over the past 6 months. While this is not significant for time frame, it is concerning given advanced age.   Medications reviewed and include colace and lasix.   Labs reviewed.   Diet Order:   Diet Order            DIET - DYS 1 Room service appropriate? Yes; Fluid consistency: Thin  Diet effective now                 EDUCATION NEEDS:   No education needs have been identified at this time  Skin:  Skin Assessment: Reviewed RN Assessment  Last BM:  Unknown  Height:   Ht Readings from  Last 1 Encounters:  06/29/20 5' (1.524 m)    Weight:   Wt Readings from Last 1 Encounters:  07/23/20 73 kg    Ideal Body Weight:  45.5 kg  BMI:  Body mass index is 31.43 kg/m.  Estimated Nutritional Needs:   Kcal:  1600-1800  Protein:  80-95 grams  Fluid:  > 1.6 L    Loistine Chance, RD, LDN, [place] Registered Dietitian II Certified Diabetes Care and Education Specialist Please refer to [facility] for RD and/or RD on-call/weekend/after hours pager

## 2020-07-23 NOTE — ED Notes (Addendum)
Notified PA mia pts critical troponin of 221

## 2020-07-23 NOTE — Consult Note (Addendum)
Cardiology Consultation:   Patient ID: Makayla Fisher MRN: [id number]; DOB: [birth year]  Admit date: 07/23/2020 Date of Consult: 07/23/2020  PCP:  [name], [place]  Cardiologist:  Jenkins Rouge, MD    Patient Profile:   Makayla Fisher is a 87 y.o. 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 Lorin Mercy.  History of Present Illness:   Ms. Junio has a history of CAD with remote CABG in 2009 in [place]. She was intolerant to higher doses of BB due to bradycardia. Carotid artery [place] in 2019 negative for stenosis. Echo in 2020 showed normal EF of and stable moderate MR. She follows with Dr. Marin Olp for chemo and is receiving IV iron for malabsorption. She lives in assisted living. She was last by Dr. Johnsie Cancel in clinic on 07/16/19 and was doing well at that time. She had memory problems on statin. She unfortunately suffered a stroke-like symptoms in Aug 2021 and received TPA. ASA was switched to plavix and she was started back on 10 mg lipitor. HTN treated with lisinopril.   She presented to [facility] with shortness of breath, nasal congestion and post-nasal drip. She started having increased difficulty with breathing last night and worsened overnight that she had to call for EMS. She had low saturation of 84%, was not able to speak in full sentence, and had BP of 212/120 per EMS exam. EKG was NSR with rate of 100. She was placed on NRB and transferred to [facility] for evaluation.   Per admission workup thus far, CBC with mild thrombocytopenia PLT 140k. CMP with GFR 55, gorssly unremarkable. BNP 517. Trop 221 > 895  > 2058. TSH WNL. COVID and flu swab negative. CXR suspected for pulmonary edema/congestion, per report concern for patchy bilateral asymmetric pulmonary infiltrates. EKG pending upload.   She is currently  admitted to family medicine service for acute decomepnsated diastolic CHF due to hypertensive crisis. She was started on IV lasix. Cardiology consult is requested for elevated troponion.   HS troponin 221 --> 895 --> 2058 --> 1762 BNP 517 sCr 0.95, K 3.9  During my interview, she is HOH, but denies current and recent chest pain. Niece is at bedside and helps with history. She reports assisted living facility contacted her with increased agitation and SOB at bout 3:30AM today. She also notes the patient is very anxious. She has improved following lasix. Has been maintained on 2L O2, not yet weaned.  She is quite active, shopped at [employer] 2 days ago. She walks with a walker.    Past Medical History:  Diagnosis Date  . GERD (gastroesophageal reflux disease)   . Heart attack ([place]) 2009  . Heparin allergy 07/23/2020  . History of blood transfusion 2009  . Iron deficiency anemia due to chronic blood loss 08/07/2014  . NHL (non-Hodgkin's lymphoma) ([place]) 10/18/2013   finished chemo dec 2015, maintenanance retuxin    Past Surgical History:  Procedure Laterality Date  . c section  1947, 1963  . CARDIAC SURGERY  2009  . CHOLECYSTECTOMY  1973  . COLON SURGERY  2014   Colostomy  . COLOSTOMY REVISION N/A  06/12/2014   Procedure: COLOSTOMY REVISION;  Surgeon: [name], MD;  Location: [facility] ORS;  Service: General;  Laterality: N/A;  . CORONARY ANGIOPLASTY WITH STENT PLACEMENT  2009  . CORONARY ARTERY BYPASS GRAFT  aug 2009    x3  . ESOPHAGOGASTRODUODENOSCOPY  2013  . EYE SURGERY Bilateral  10 years ago   lens replacments cataracts  . right chest pac  june 2015     Home Medications:  Prior to Admission medications   Medication Sig Start Date End Date Taking? Authorizing Provider   atorvastatin (LIPITOR) 10 MG tablet Take 1 tablet (10 mg total) by mouth daily. Patient taking differently: Take 10 mg by mouth at bedtime. 11/29/19  Yes Donzetta Starch, NP  Calcium Carb-Cholecalciferol (CALCIUM 600+D3 PO) Take 1 tablet by mouth at bedtime.   Yes [provider]  Cholecalciferol (VITAMIN D3 SUPER STRENGTH) 50 MCG (2000 UT) CAPS Take 4,000 Units by mouth daily.    Yes [provider]  clopidogrel (PLAVIX) 75 MG tablet Take 1 tablet (75 mg total) by mouth daily. 11/29/19  Yes Donzetta Starch, NP  Cranberry 425 MG CAPS Take 425 mg by mouth daily.    Yes [provider]  levothyroxine (SYNTHROID, LEVOTHROID) 50 MCG tablet Take 50 mcg by mouth daily before breakfast.  10/08/15  Yes [provider]  lidocaine-prilocaine (EMLA) cream Apply 1 application topically See admin instructions. Apply to port-a-cath one hour prior to procedure 09/16/19  Yes [provider]  lisinopril (PRINIVIL,ZESTRIL) 20 MG tablet Take 1 tablet (20 mg total) by mouth daily. 05/10/18  Yes [name], MD  Multiple Vitamin (DAILY VITE PO) Take 1 tablet by mouth See admin instructions. Daily at 12 noon   Yes [provider]  Multiple Vitamins-Minerals (PRESERVISION AREDS PO) Take 1 tablet by mouth in the morning and at bedtime.   Yes [provider]  nystatin cream (MYCOSTATIN) Apply 1 application topically every 12 (twelve) hours as needed (to rash between the legs and groin).   Yes [provider]  psyllium (METAMUCIL) 58.6 % packet Take 1 packet by mouth every Monday, Wednesday, and Friday. [place] 11/29/19  Yes Donzetta Starch, NP  psyllium (METAMUCIL) 58.6 % packet Take 1 packet by mouth daily as needed (constipation).   Yes [provider]  rOPINIRole (REQUIP) 1 MG tablet Take 1 mg by mouth at bedtime.   Yes [provider]  sertraline (ZOLOFT) 25 MG tablet Take 25 mg by mouth daily.   Yes [provider]  SPIRIVA  HANDIHALER 18 MCG inhalation capsule Place 18 mcg into inhaler and inhale daily.  07/23/15  Yes [provider]  timolol (TIMOPTIC) 0.5 % ophthalmic solution Place 1 drop into both eyes 2 (two) times daily. Affected eye(s)   Yes [provider]    Inpatient Medications: Scheduled Meds: . atorvastatin  10 mg Oral QHS  . clopidogrel  75 mg Oral Daily  . docusate sodium  100 mg Oral BID  . furosemide  40 mg Intravenous BID  . [START ON 07/24/2020] levothyroxine  50 mcg Oral QAC breakfast  . lisinopril  20 mg Oral Daily  . [START ON 07/24/2020] psyllium  1 packet Oral Q M,W,F  . rOPINIRole  1 mg Oral QHS  . sertraline  25 mg Oral Daily  . sodium chloride flush  10-40 mL Intracatheter Q12H  . sodium chloride flush  3 mL Intravenous Q12H  . timolol  1 drop Both Eyes BID  . umeclidinium  bromide  1 puff Inhalation Daily   Continuous Infusions: . argatroban 0.75 mcg/kg/min (07/23/20 1411)   PRN Meds: acetaminophen **OR** acetaminophen, bisacodyl, hydrALAZINE, morphine injection, ondansetron **OR** ondansetron (ZOFRAN) IV, oxyCODONE, polyethylene glycol, psyllium, sodium chloride flush, traZODone  Allergies:    Allergies  Allergen Reactions  . Epipen [Epinephrine Hcl (Nasal)] Palpitations  . Levaquin [Levofloxacin In D5w] Other (See Comments)    Weakness- "Allergic," per MAR  . Chlorhexidine Gluconate Other (See Comments)    Pt declined use and suspects she may have an intolerance  . Heparin Other (See Comments)    "Alleregic," pre MAR- Maybe affected platelets, per patient  . Sulfa Antibiotics Rash    Social History:   Social History   Socioeconomic History  . Marital status: Widowed    Spouse name: Not on file  . Number of children: Not on file  . Years of education: Not on file  . Highest education level: Not on file  Occupational History  . Not on file  Tobacco Use  . Smoking status: Former Smoker    Packs/day: 1.00    Years: 25.00    Pack years: 25.00     Types: Cigarettes    Start date: 05/16/1948    Quit date: 05/17/1963    Years since quitting: 57.2  . Smokeless tobacco: Never Used  . Tobacco comment: quit smoking 50 years ago  Vaping Use  . Vaping Use: Never used  Substance and Sexual Activity  . Alcohol use: No    Alcohol/week: 0.0 standard drinks  . Drug use: No  . Sexual activity: Not on file  Other Topics Concern  . Not on file  Social History Narrative  . Not on file   Social Determinants of Health   Financial Resource Strain: Not on file  Food Insecurity: Not on file  Transportation Needs: Not on file  Physical Activity: Not on file  Stress: Not on file  Social Connections: Not on file  Intimate Partner Violence: Not on file    Family History:    Family History  Problem Relation Age of Onset  . Diverticulitis Mother   . Heart attack Mother   . Ulcers Father      ROS:  Please see the history of present illness.   All other ROS reviewed and negative.     Physical Exam/Data:   Vitals:   07/23/20 1200 07/23/20 1300 07/23/20 1328 07/23/20 1454  BP:  (!) 133/49  131/87  Pulse:  (!) 59  (!) 57  Resp:  18  16  Temp:   98.1 F (36.7 C) 98.1 F (36.7 C)  TempSrc:   Oral Oral  SpO2:  92%  96%  Weight: 73 kg      No intake or output data in the 24 hours ending 07/23/20 1502 Last 3 Weights 07/23/2020 06/29/2020 05/21/2020  Weight (lbs) 160 lb 15 oz 161 lb 161 lb  Weight (kg) 73 kg 73.029 kg 73.029 kg     Body mass index is 31.43 kg/m.  General:  Elderly female in NAD,  But anxious Lymph: no adenopathy Neck: + JVD Vascular: No carotid bruits  Cardiac:  normal S1, S2; RRR; no murmur Lungs:  No crackles on anterior lung exam Abd: soft, nontender, no hepatomegaly  Ext: no edema Musculoskeletal:  No deformities, BUE and BLE strength normal and equal Skin: warm and dry  Neuro:  CNs 2-12 intact, no focal abnormalities noted Psych:  Normal affect   EKG:  The EKG was personally  reviewed and demonstrates:   Sinus rhythm HR 91, repolarization abnormality Telemetry:  Telemetry was personally reviewed and demonstrates:  Sinus rhythm HR in the 70-80s  Relevant CV Studies:  Echo pending  Echo 11/27/2018: 1. Left ventricular ejection fraction, by estimation, is 60 to 65%. The  left ventricle has normal function. The left ventricle has no regional  wall motion abnormalities. There is mild left ventricular hypertrophy of  the basal-septal segment. Left  ventricular diastolic parameters are consistent with Grade II diastolic  dysfunction (pseudonormalization). Elevated left atrial pressure.  2. Right ventricular systolic function is normal. The right ventricular  size is normal. There is mildly elevated pulmonary artery systolic  pressure.  3. Left atrial size was moderately dilated.  4. The mitral valve is degenerative. Mild mitral valve regurgitation. No  evidence of mitral stenosis.  5. The aortic valve is tricuspid. Aortic valve regurgitation is not  visualized. Mild aortic valve sclerosis is present, with no evidence of  aortic valve stenosis.   Laboratory Data:  High Sensitivity Troponin:   Recent Labs  Lab 07/23/20 0500 07/23/20 0629 07/23/20 1022 07/23/20 1220  TROPONINIHS 221* 895* 2,058* 1,762*     Chemistry Recent Labs  Lab 07/23/20 0500  NA 140  K 3.9  CL 104  CO2 25  GLUCOSE 140*  BUN 14  CREATININE 0.95  CALCIUM 10.0  GFRNONAA 55*  ANIONGAP 11    Recent Labs  Lab 07/23/20 0500  PROT 6.1*  ALBUMIN 3.7  AST 22  ALT 12  ALKPHOS 57  BILITOT 1.0   Hematology Recent Labs  Lab 07/23/20 0500  WBC 10.1  RBC 4.46  HGB 14.1  HCT 45.0  MCV 100.9*  MCH 31.6  MCHC 31.3  RDW 14.5  PLT 140*   BNP Recent Labs  Lab 07/23/20 0500  BNP 517.5*    DDimer No results for input(s): DDIMER in the last 168 hours.   Radiology/Studies:  DG Chest Portable 1 View  Result Date: 07/23/2020 CLINICAL DATA:  Dyspnea EXAM: PORTABLE CHEST 1 VIEW COMPARISON:   05/31/2017 FINDINGS: Right internal jugular central venous catheter tip is seen within the superior vena cava. Lung volumes are small and pulmonary insufflation has slightly diminished since prior examination. Superimposed asymmetric patchy pulmonary infiltrates have developed within the right lung and left lung base, likely infectious or inflammatory in nature. No pneumothorax or pleural effusion. Coronary artery bypass grafting has been performed. Cardiac size within normal limits. Atherosclerotic calcification noted within a mildly tortuous thoracic aorta. No acute bone abnormality. IMPRESSION: Interval development of patchy bilateral asymmetric pulmonary infiltrates, likely infectious or inflammatory in nature. Mild pulmonary hypoinflation.  There Electronically Signed   By: Fidela Salisbury MD   On: 07/23/2020 04:52     Assessment and Plan:   Acute on chronic diastolic heart failure Acute hypoxic respiratory failure - BNP 517 - sCr 0.95, K 3.9 - suspect flash pulmonary edema in the setting of elevated BP - have received 20 mg IV lasix x 1, scheduled for 40 mg IV lasix BID - no I&Os charted, dry weight unclear - pending repeat echo - GDMT may be limited - historically did not tolerate BB due to bradycardia - continue home ACEI - if echo with reduced EF, can consider ARB to ARNI therapy   Mild to moderate MR - pending repeat echo   Elevated troponin - troponin trending down: 221 --> 895 --> 2058 --> 1762 - 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, no heparin running, using argatroban  for DVT ppx - EKG does not appear ischemic - continue plavix - pt denies chest pain - suspect demand ischemia in the setting of hypoxia and hypertensive urgency   Hypertensive urgency Hypertension - presented 198/92 - received 20 mg lisinopril with improvement in pressure - agree with PRN hydralazine   Hx of stroke - continue 10 mg lipitor, plavix   Hx of non-Hodgkin's lymphoma - completed  rituxan in June 2018    Risk Assessment/Risk Scores:     TIMI Risk Score for Unstable Angina or Non-ST Elevation MI:   The patient's TIMI risk score is 4, which indicates a 20% risk of all cause mortality, new or recurrent myocardial infarction or need for urgent revascularization in the next 14 days.{ Click here to calculate score   New York Heart Association (NYHA) Functional Class NYHA Class II     For questions or updates, please contact [facility] Please consult [email or web address] for contact info under    Signed, Ledora Bottcher, PA  07/23/2020 3:02 PM  Personally seen and examined. Agree with APP above with the following comments: Briefly  87 yo F with a history of Non-Hodkin's Lymphoa on Rituxan (finished 2018), IDA s/p Fereheme; CAD s/p CABG in 2009, Mild to moderate MR; prior bradycardia related BB intolerance, HIT?, stricture and colostomy who presented for SOB in the setting of elevated BP Patient notes no chest pain; different from 2009.  Her and niece note that she was able to walk [employer] two days ago.  Notes that her facility doesn't do anything Exam notable for no significant MR murmur, stable stoma, no pitting edema, coarse breath sounds with poor air movement. Labs notable for troponin peak of 2K;  Personally reviewed relevant tests; sinus arrhythmia but no evidence of PAF through telemetry review (this was present on echo as well).  Mild to moderate MR, mild RV enlargement, LVEF is grossly preserved; formal read is pending. Would recommend lasix and BP control for now - NSTEMI appears to be demand medicated; no WMA; seen during echo performance - continue plavix, argatroban, ACE, and lasix.  If PRN hydralazine is needed we will schedule hydralazine to assist with BP control. - discussed at length with patient (HOH and anxious) and niece.  Discussed with nursing team  Rudean Haskell, MD [place]  [address] [place], [place] [address] [phone]  3:42 PM

## 2020-07-23 NOTE — ED Notes (Signed)
Increased Troponin noted on repeat testing. Provider paged  Pt continues to deny chest pain. Is alert and oriented  Respirations are even and non-labored, pt does not appear in distress

## 2020-07-23 NOTE — Evaluation (Signed)
Physical Therapy Evaluation Patient Details Name: Makayla Fisher MRN: [id number] DOB: 14-Sep-1922 Today's Date: 07/23/2020   History of Present Illness  Pt is a 87 y/o female admitted secondary to increased SOB. Thought to be secondary to CHF exacerbation. PMH includes CAD s/p CABG, CVA, and s/p colostomy.  Clinical Impression  Pt admitted secondary to problem above with deficits below. Pt requiring min guard A to stand and take side steps at edge of stretcher. Pt with episode of incontinence upon standing, so further mobility deferred. Pt currently lives at [facility], and reports staff only assists with meals and med management. Feel if pt progresses well, will likely be able to d/c back to ALF with [facility] services. However, if pt does not progress well, may need to consider SNF level therapies depending on how much ALF staff can assist. Will continue to follow acutely to maximize functional mobility independence and safety.     Follow Up Recommendations Home health PT;Supervision for mobility/OOB (pending mobility progression)    Equipment Recommendations  None recommended by PT    Recommendations for Other Services       Precautions / Restrictions Precautions Precautions: Fall Restrictions Weight Bearing Restrictions: No      Mobility  Bed Mobility Overal bed mobility: Needs Assistance Bed Mobility: Supine to Sit;Sit to Supine     Supine to sit: Min assist Sit to supine: Min assist   General bed mobility comments: Min A for trunk and LE assist. Increased time required.    Transfers Overall transfer level: Needs assistance Equipment used: Rolling walker (2 wheeled) Transfers: Sit to/from Stand Sit to Stand: Min guard         General transfer comment: Min guard for safety to stand from higher stretcher height.  Ambulation/Gait Ambulation/Gait assistance: Min guard   Assistive device: Rolling walker (2 wheeled)       General Gait Details: Took steps at EOB. Further  mobility limited secondary to incontinence. Min guard for safety.  Stairs            Wheelchair Mobility    Modified Rankin (Stroke Patients Only)       Balance Overall balance assessment: Needs assistance Sitting-balance support: No upper extremity supported;Feet supported Sitting balance-Leahy Scale: Good     Standing balance support: Bilateral upper extremity supported;During functional activity Standing balance-Leahy Scale: Poor Standing balance comment: Reliant on BUE support                             Pertinent Vitals/Pain Pain Assessment: No/denies pain    Home Living Family/patient expects to be discharged to:: Assisted living               Home Equipment: Walker - 4 wheels;Grab bars - toilet;Grab bars - tub/shower;Shower seat Additional Comments: [facility]    Prior Function Level of Independence: Independent with assistive device(s);Needs assistance   Gait / Transfers Assistance Needed: Uses rollator to ambulate at ALF  ADL's / Homemaking Assistance Needed: Pt reports independence with dressing and bathing PTA, states all meals are prepared for her and she takes her meals in dining room.        Hand Dominance        Extremity/Trunk Assessment   Upper Extremity Assessment Upper Extremity Assessment: Defer to OT evaluation    Lower Extremity Assessment Lower Extremity Assessment: Generalized weakness    Cervical / Trunk Assessment Cervical / Trunk Assessment: Normal  Communication   Communication: [facility]  Cognition Arousal/Alertness: Awake/alert Behavior During Therapy: WFL for tasks assessed/performed Overall Cognitive Status: Within Functional Limits for tasks assessed                                        General Comments      Exercises     Assessment/Plan    PT Assessment Patient needs continued PT services  PT Problem List Decreased strength;Decreased mobility;Decreased activity  tolerance;Decreased balance;Decreased knowledge of use of DME;Decreased knowledge of precautions       PT Treatment Interventions DME instruction;Gait training;Functional mobility training;Therapeutic activities;Therapeutic exercise;Balance training;Patient/family education    PT Goals (Current goals can be found in the Care Plan section)  Acute Rehab PT Goals Patient Stated Goal: to feel better PT Goal Formulation: With patient Time For Goal Achievement: 08/06/20 Potential to Achieve Goals: Fair    Frequency Min 3X/week   Barriers to discharge        Co-evaluation               AM-PAC PT "6 Clicks" Mobility  Outcome Measure Help needed turning from your back to your side while in a flat bed without using bedrails?: A Little Help needed moving from lying on your back to sitting on the side of a flat bed without using bedrails?: A Little Help needed moving to and from a bed to a chair (including a wheelchair)?: A Little Help needed standing up from a chair using your arms (e.g., wheelchair or bedside chair)?: A Little Help needed to walk in hospital room?: A Little Help needed climbing 3-5 steps with a railing? : A Lot 6 Click Score: 17    End of Session Equipment Utilized During Treatment: Gait belt Activity Tolerance: Other (comment) (limited secondary to incontinence) Patient left: in bed;with call bell/phone within reach (on stretcher in ED) Nurse Communication: Mobility status PT Visit Diagnosis: Unsteadiness on feet (R26.81);Muscle weakness (generalized) (M62.81)    Time: 9480-1655 PT Time Calculation (min) (ACUTE ONLY): 28 min   Charges:   PT Evaluation $PT Eval Low Complexity: 1 Low PT Treatments $Therapeutic Activity: 8-22 mins        Lou Miner, DPT  Acute Rehabilitation Services  Pager: [phone] Office: [phone]   Rudean Hitt 07/23/2020, 1:24 PM

## 2020-07-24 DIAGNOSIS — I214 Non-ST elevation (NSTEMI) myocardial infarction: Secondary | ICD-10-CM | POA: Diagnosis not present

## 2020-07-24 DIAGNOSIS — E876 Hypokalemia: Secondary | ICD-10-CM

## 2020-07-24 DIAGNOSIS — I340 Nonrheumatic mitral (valve) insufficiency: Secondary | ICD-10-CM | POA: Diagnosis not present

## 2020-07-24 DIAGNOSIS — Z888 Allergy status to other drugs, medicaments and biological substances status: Secondary | ICD-10-CM | POA: Diagnosis not present

## 2020-07-24 LAB — CBC
HCT: 39 % (ref 36.0–46.0)
Hemoglobin: 13.2 g/dL (ref 12.0–15.0)
MCH: 32.4 pg (ref 26.0–34.0)
MCHC: 33.8 g/dL (ref 30.0–36.0)
MCV: 95.6 fL (ref 80.0–100.0)
Platelets: 129 10*3/uL — ABNORMAL LOW (ref 150–400)
RBC: 4.08 MIL/uL (ref 3.87–5.11)
RDW: 14.3 % (ref 11.5–15.5)
WBC: 8.7 10*3/uL (ref 4.0–10.5)
nRBC: 0 % (ref 0.0–0.2)

## 2020-07-24 LAB — APTT
aPTT: 44 seconds — ABNORMAL HIGH (ref 24–36)
aPTT: 49 seconds — ABNORMAL HIGH (ref 24–36)
aPTT: 31 seconds (ref 24–36)

## 2020-07-24 LAB — BASIC METABOLIC PANEL
Anion gap: 9 (ref 5–15)
BUN: 14 mg/dL (ref 8–23)
CO2: 29 mmol/L (ref 22–32)
Calcium: 9.1 mg/dL (ref 8.9–10.3)
Chloride: 98 mmol/L (ref 98–111)
Creatinine, Ser: 0.88 mg/dL (ref 0.44–1.00)
GFR, Estimated: >60 mL/min (ref >60)
Glucose, Bld: 99 mg/dL (ref 70–99)
Potassium: 3.1 mmol/L — ABNORMAL LOW (ref 3.5–5.1)
Sodium: 136 mmol/L (ref 135–145)

## 2020-07-24 MED ORDER — ATORVASTATIN CALCIUM 40 MG PO TABS
40.0000 mg | ORAL_TABLET | Freq: Every day | ORAL | Status: DC
  Administered 2020-07-24 – 2020-07-26 (×3): 40 mg via ORAL
  Filled 2020-07-24 (×3): qty 1

## 2020-07-24 MED ORDER — POTASSIUM CHLORIDE 20 MEQ PO PACK
80.0000 meq | PACK | Freq: Once | ORAL | Status: AC
  Administered 2020-07-24: 80 meq via ORAL
  Filled 2020-07-24: qty 4

## 2020-07-24 MED ORDER — ASPIRIN EC 81 MG PO TBEC
81.0000 mg | DELAYED_RELEASE_TABLET | Freq: Every day | ORAL | Status: DC
  Administered 2020-07-24 – 2020-07-27 (×4): 81 mg via ORAL
  Filled 2020-07-24 (×4): qty 1

## 2020-07-24 MED ORDER — ENSURE ENLIVE PO LIQD
237.0000 mL | Freq: Two times a day (BID) | ORAL | Status: DC
  Administered 2020-07-24 – 2020-07-26 (×5): 237 mL via ORAL

## 2020-07-24 NOTE — Progress Notes (Signed)
Nutrition Follow-up  DOCUMENTATION CODES:   Obesity unspecified  INTERVENTION:   -Continue Magic cup BID with meals, each supplement provides 290 kcal and 9 grams of protein -Continue MVI with minerals daily -Ensure Enlive po BID, each supplement provides 350 kcal and 20 grams of protein  NUTRITION DIAGNOSIS:   Increased nutrient needs related to chronic illness (CHF) as evidenced by estimated needs.  Ongoing  GOAL:   Patient will meet greater than or equal to 90% of their needs  Progressing   MONITOR:   PO intake,Supplement acceptance,Labs,Weight trends,Skin,I & O's  REASON FOR ASSESSMENT:   Consult Assessment of nutrition requirement/status  ASSESSMENT:   Makayla Fisher is a 87 y.o. female with medical history significant of NHL; CAD; and GERD presenting with SOB.  No cognitive impairment, very independent at baseline.  Hey called this AM - BP up, anxious, SOB.  She does tend to get very anxious.  She had SOB yesterday before bed but it acutely worsened overnight.  She went to bed early not feeling well, had a headache.  She feels better now, improved SOB.  She feels tired, but didn't sleep at all.  She has not been coughing.  Her BP is usually fairly well controlled.  She did not have chest pain at all.  Reviewed I/O's: -412 ml x 24 hours  UOP: 425 ml x 24 hours  Spoke with pt, who was very hearing impaired, but able to communicate by speaking loudly and clearly next to pt's ear. She shares that she has had a decreased appetite over the past few weeks, but is unsure why. She consumes a pureed diet at home. Observed breakfast meal tray- pt consumed less than 25%. Pt working on lunch tray without difficulty.   Per pt, she usually consumes 2 meals per day.   Pt endorses wt loss, but unsure of UBW or how much she lost ("I'm scared that my non-Hodgkin's lymphoma has come back"). Reviewed wt hx; pt has experienced a 14% wt loss over the past year. While this is not  significant for time frame, it is concerning given pt's advanced age and multiple co-morbidities.   Discussed importance of good meal and supplement intake to promote healing.   Medications reviewed and include colace and metamucil.  Labs reviewed: K: 3.1.    NUTRITION - FOCUSED PHYSICAL EXAM:  Flowsheet Row Most Recent Value  Orbital Region No depletion  Upper Arm Region Mild depletion  Thoracic and Lumbar Region No depletion  Buccal Region No depletion  Temple Region No depletion  Clavicle Bone Region No depletion  Clavicle and Acromion Bone Region No depletion  Scapular Bone Region No depletion  Dorsal Hand Mild depletion  Patellar Region No depletion  Anterior Thigh Region No depletion  Posterior Calf Region No depletion  Edema (RD Assessment) Mild  Hair Reviewed  Eyes Reviewed  Mouth Reviewed  Skin Reviewed  Nails Reviewed       Diet Order:   Diet Order            DIET - DYS 1 Room service appropriate? Yes; Fluid consistency: Thin  Diet effective now                 EDUCATION NEEDS:   No education needs have been identified at this time  Skin:  Skin Assessment: Reviewed RN Assessment  Last BM:  07/24/20 (via colostomy)  Height:   Ht Readings from Last 1 Encounters:  06/29/20 5' (1.524 m)    Weight:  Wt Readings from Last 1 Encounters:  07/24/20 72.1 kg    Ideal Body Weight:  45.5 kg  BMI:  Body mass index is 31.04 kg/m.  Estimated Nutritional Needs:   Kcal:  1600-1800  Protein:  80-95 grams  Fluid:  > 1.6 L    Loistine Chance, RD, LDN, [place] Registered Dietitian II Certified Diabetes Care and Education Specialist Please refer to [facility] for RD and/or RD on-call/weekend/after hours pager

## 2020-07-24 NOTE — Consult Note (Signed)
[facility] Nurse ostomy follow up I placed an order to request one of each of the following fecal pouches, Kellie Simmering #[phone] and Kellie Simmering #[phone], and 2 barrier rings, Kellie Simmering # [phone].   Val Riles, RN, MSN, CWOCN, CNS-BC, pager [phone]

## 2020-07-24 NOTE — Progress Notes (Signed)
ANTICOAGULATION CONSULT NOTE - Follow Up Consult  Pharmacy Consult for argatroban Indication: elevated troponin  Labs: Recent Labs    07/23/20 0500 07/23/20 0629 07/23/20 1022 07/23/20 1220 07/23/20 1838 07/24/20 0058  HGB 14.1  --   --   --   --  13.2  HCT 45.0  --   --   --   --  39.0  PLT 140*  --   --   --   --  129*  APTT  --   --   --   --  37* 44*  CREATININE 0.95  --   --   --   --  0.88  TROPONINIHS 221* 895* 2,058* 1,762*  --   --     Assessment: 87yo female subtherapeutic on argatroban after rate change; no gtt issues or signs of bleeding per RN.  Goal of Therapy:  aPTT 50-90 seconds   Plan:  Will increase heparin gtt by 20% to 1.2 mcg/kg/min and check PTT in 4 hours.  Wynona Neat, PharmD, BCPS  07/24/2020,2:25 AM

## 2020-07-24 NOTE — Progress Notes (Signed)
[place] for Argatroban Indication: chest pain/ACS  Allergies  Allergen Reactions  . Epipen [Epinephrine Hcl (Nasal)] Palpitations  . Levaquin [Levofloxacin In D5w] Other (See Comments)    Weakness- "Allergic," per MAR  . Chlorhexidine Gluconate Other (See Comments)    Pt declined use and suspects she may have an intolerance  . Heparin Other (See Comments)    "Alleregic," pre MAR- Maybe affected platelets, per patient  . Sulfa Antibiotics Rash    Patient Measurements: Weight 73 kg   Vital Signs: Temp: 98.3 F (36.8 C) (04/01 0755) Temp Source: Oral (04/01 0755) BP: 114/42 (04/01 0755) Pulse Rate: 62 (04/01 0755)  Labs: Recent Labs    07/23/20 0500 07/23/20 0629 07/23/20 1022 07/23/20 1220 07/23/20 1838 07/24/20 0058 07/24/20 0658  HGB 14.1  --   --   --   --  13.2  --   HCT 45.0  --   --   --   --  39.0  --   PLT 140*  --   --   --   --  129*  --   APTT  --   --   --   --  37* 44* 49*  CREATININE 0.95  --   --   --   --  0.88  --   TROPONINIHS 221* 895* 2,058* 1,762*  --   --   --     Estimated Creatinine Clearance: 33.9 mL/min (by C-G formula based on SCr of 0.88 mg/dL).   Medical History: Past Medical History:  Diagnosis Date  . GERD (gastroesophageal reflux disease)   . Heart attack ([place]) 2009  . Heparin allergy 07/23/2020  . History of blood transfusion 2009  . Iron deficiency anemia due to chronic blood loss 08/07/2014  . NHL (non-Hodgkin's lymphoma) ([place]) 10/18/2013   finished chemo dec 2015, maintenanance retuxin    Medications:  Medications Prior to Admission  Medication Sig Dispense Refill Last Dose  . atorvastatin (LIPITOR) 10 MG tablet Take 1 tablet (10 mg total) by mouth daily. (Patient taking differently: Take 10 mg by mouth at bedtime.) 30 tablet 2 07/22/2020 at Unknown time  . Calcium Carb-Cholecalciferol (CALCIUM 600+D3 PO) Take 1 tablet by mouth at bedtime.   07/22/2020 at Unknown time  .  Cholecalciferol (VITAMIN D3 SUPER STRENGTH) 50 MCG (2000 UT) CAPS Take 4,000 Units by mouth daily.    07/22/2020 at Unknown time  . clopidogrel (PLAVIX) 75 MG tablet Take 1 tablet (75 mg total) by mouth daily. 30 tablet 2 07/22/2020 at Unknown time  . Cranberry 425 MG CAPS Take 425 mg by mouth daily.    07/22/2020 at Unknown time  . levothyroxine (SYNTHROID, LEVOTHROID) 50 MCG tablet Take 50 mcg by mouth daily before breakfast.    07/22/2020 at Unknown time  . lidocaine-prilocaine (EMLA) cream Apply 1 application topically See admin instructions. Apply to port-a-cath one hour prior to procedure   07/16/2020  . lisinopril (PRINIVIL,ZESTRIL) 20 MG tablet Take 1 tablet (20 mg total) by mouth daily. 30 tablet 3 07/22/2020 at Unknown time  . Multiple Vitamin (DAILY VITE PO) Take 1 tablet by mouth See admin instructions. Daily at 12 noon   07/22/2020 at Unknown time  . Multiple Vitamins-Minerals (PRESERVISION AREDS PO) Take 1 tablet by mouth in the morning and at bedtime.   07/22/2020 at Unknown time  . nystatin cream (MYCOSTATIN) Apply 1 application topically every 12 (twelve) hours as needed (to rash between the legs and groin).  unk  . psyllium (METAMUCIL) 58.6 % packet Take 1 packet by mouth every Monday, Wednesday, and Friday. ORANGE   07/22/2020 at Unknown time  . psyllium (METAMUCIL) 58.6 % packet Take 1 packet by mouth daily as needed (constipation).   unk  . rOPINIRole (REQUIP) 1 MG tablet Take 1 mg by mouth at bedtime.   07/21/2020  . sertraline (ZOLOFT) 25 MG tablet Take 25 mg by mouth daily.   07/22/2020 at Unknown time  . SPIRIVA HANDIHALER 18 MCG inhalation capsule Place 18 mcg into inhaler and inhale daily.    07/22/2020 at Unknown time  . timolol (TIMOPTIC) 0.5 % ophthalmic solution Place 1 drop into both eyes 2 (two) times daily. Affected eye(s)   07/22/2020 at Unknown time    Assessment: 89 YOF who presents with shortness of breath with rising troponins. Pharmacy consulted on 07/23/20 to start  argatroban in the setting of heparin allergy.  Heparin allergy per MAR at  [facility])  Of note, patient received one dose of prophylactic Arixtra on 3/31. Admit  H/H wnl, Plt low, LFTs wnl   Next 4 hr PTT = 49 seconds on Argatroban 1.2 mckg/kg/min. No issues with infusion and no bleeding noted per discussion with RN.  H/o iron deficiency anemia. H/H remains wnl, Plt low 140 on admit >129k.    Goal of Therapy:  aPTT 50-90 seconds Monitor platelets by anticoagulation protocol: Yes   Plan: Increase argatroban gtt by 20% per protocol to 1.44  mcg/kg/min F/u 4 hour aPTT.  CBC daily    Thank you for allowing pharmacy to be part of this patients care team.  Nicole Cella, [place] Pharmacist [phone] Please check AMION for all [place] phone numbers After 10:00 PM, call [place] 07/24/2020 9:22 AM

## 2020-07-24 NOTE — Progress Notes (Signed)
PROGRESS NOTE    Makayla Fisher  CWC:[id number] DOB: [birth year] DOA: 07/23/2020 PCP: [name], DO  Outpatient Specialists:   Brief Narrative:  As per H&P done by Dr. Lorin Mercy: "Makayla Fisher is a 87 y.o. female with medical history significant of NHL; CAD; and GERD presenting with SOB.  No cognitive impairment, very independent at baseline.  Hey called this AM - BP up, anxious, SOB.  She does tend to get very anxious.  She had SOB yesterday before bed but it acutely worsened overnight.  She went to bed early not feeling well, had a headache.  She feels better now, improved SOB.  She feels tired, but didn't sleep at all.  She has not been coughing.  Her BP is usually fairly well controlled.  She did not have chest pain at all.   ED Course:  Carryover, per Dr. Alcario Drought:  87 yo F with no h/o pulmonary pathology. In with SOB for past few days. EMS noted BP 212/120. Been compliant with meds at [facility]. Suspicion of flash pulm edema though CXR being read as infectious vs inflamatory. BNP elevated. On 2L [place]. Got home meds now 178/90. 20 lasix ordered"   Assessment & Plan:   Principal Problem:   Acute congestive heart failure (HCC) Active Problems:   HTN (hypertension)   Dyslipidemia   Hypothyroidism   Heparin allergy   Acute CHF, likely hypertensive crisis -Patient with known h/o chronic diastolic CHF (echo in 10/6158 with grade 2 diastolic dysfunction) presenting with worsening SOB and marked HTN -CXR consistent with pulmonary edema (read as infiltrates) -Elevated BNP compared to prior -Echocardiogram revealed normal left ventricular ejection fraction, diastolic dysfunction, hypokinesis of the left ventricle, severely dilated left atrium, mild to moderately dilated right atrium, and aortic valve sclerosis.  -Aspirin and Plavix are continued. -IV Lasix will be continued. -Cardiology input is appreciated.  Elevated troponin with significantly increased delta; likely  related to demand ischemia given the circumstances and lack of chest pain - will continue to trend -Continue [place] O2 for now  HTN -Continue Lisinopril -Continue to optimize.  HLD -Continue Lipitor  Hypothyroidism -TSH was 3.658.   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     DVT prophylaxis: Argatroban. Code Status: DO NOT RESUSCITATE Family Communication:  Disposition Plan: Likely discharge back to assisted living facility.   Consultants:   Cardiology  Procedures:   None  Antimicrobials:   None   Subjective: Shortness of breath is improving.  Objective: Vitals:   07/24/20 0522 07/24/20 0755 07/24/20 1003 07/24/20 1134  BP: 130/62 (!) 114/42 (!) 152/62 (!) 114/39  Pulse: (!) 59 62  (!) 58  Resp: 17 16  16   Temp: 98.5 F (36.9 C) 98.3 F (36.8 C)  98 F (36.7 C)  TempSrc: Oral Oral    SpO2: 95% 95%  94%  Weight:        Intake/Output Summary (Last 24 hours) at 07/24/2020 1451 Last data filed at 07/24/2020 1019 Gross per 24 hour  Intake 13 ml  Output 425 ml  Net -412 ml   Filed Weights   07/23/20 1200 07/24/20 0002 07/24/20 0500  Weight: 73 kg 72.1 kg 72.1 kg    Examination:  General exam: Appears calm and comfortable.  Patient is obese.  Patient is not in any significant distress. Respiratory system: Decreased air entry. Cardiovascular system: S1 & S2 with systolic murmur.  Gastrointestinal system: Abdomen is obese, soft and  nontender.  Organs are difficult to assess. Central nervous system: Alert and oriented.  Patient moves all extremities. Extremities: No leg edema.  Data Reviewed: I have personally reviewed following labs and imaging studies  CBC: Recent Labs  Lab 07/23/20 0500 07/24/20 0058  WBC 10.1 8.7  NEUTROABS 7.1  --   HGB 14.1 13.2  HCT 45.0 39.0  MCV 100.9* 95.6  PLT 140* 376*   Basic Metabolic Panel: Recent Labs  Lab 07/23/20 0500  07/24/20 0058  NA 140 136  K 3.9 3.1*  CL 104 98  CO2 25 29  GLUCOSE 140* 99  BUN 14 14  CREATININE 0.95 0.88  CALCIUM 10.0 9.1   GFR: Estimated Creatinine Clearance: 33.9 mL/min (by C-G formula based on SCr of 0.88 mg/dL). Liver Function Tests: Recent Labs  Lab 07/23/20 0500  AST 22  ALT 12  ALKPHOS 57  BILITOT 1.0  PROT 6.1*  ALBUMIN 3.7   No results for input(s): LIPASE, AMYLASE in the last 168 hours. No results for input(s): AMMONIA in the last 168 hours. Coagulation Profile: No results for input(s): INR, PROTIME in the last 168 hours. Cardiac Enzymes: No results for input(s): CKTOTAL, CKMB, CKMBINDEX, TROPONINI in the last 168 hours. BNP (last 3 results) No results for input(s): PROBNP in the last 8760 hours. HbA1C: No results for input(s): HGBA1C in the last 72 hours. CBG: No results for input(s): GLUCAP in the last 168 hours. Lipid Profile: No results for input(s): CHOL, HDL, LDLCALC, TRIG, CHOLHDL, LDLDIRECT in the last 72 hours. Thyroid Function Tests: Recent Labs    07/23/20 1022  TSH 3.658   Anemia Panel: No results for input(s): VITAMINB12, FOLATE, FERRITIN, TIBC, IRON, RETICCTPCT in the last 72 hours. Urine analysis:    Component Value Date/Time   COLORURINE STRAW (A) 11/27/2019 0421   APPEARANCEUR CLEAR 11/27/2019 0421   LABSPEC 1.013 11/27/2019 0421   PHURINE 7.0 11/27/2019 0421   GLUCOSEU NEGATIVE 11/27/2019 0421   HGBUR NEGATIVE 11/27/2019 0421   BILIRUBINUR NEGATIVE 11/27/2019 0421   KETONESUR NEGATIVE 11/27/2019 0421   PROTEINUR 30 (A) 11/27/2019 0421   UROBILINOGEN 0.2 08/19/2013 1624   NITRITE NEGATIVE 11/27/2019 0421   LEUKOCYTESUR SMALL (A) 11/27/2019 0421   Sepsis Labs: @LABRCNTIP (procalcitonin:4,lacticidven:4)  ) Recent Results (from the past 240 hour(s))  Resp Panel by RT-PCR (Flu A&B, Covid) Nasopharyngeal Swab     Status: None   Collection Time: 07/23/20  4:28 AM   Specimen: Nasopharyngeal Swab; Nasopharyngeal(NP) swabs  in vial transport medium  Result Value Ref Range Status   SARS Coronavirus 2 by RT PCR NEGATIVE NEGATIVE Final    Comment: (NOTE) SARS-CoV-2 target nucleic acids are NOT DETECTED.  The SARS-CoV-2 RNA is generally detectable in upper respiratory specimens during the acute phase of infection. The lowest concentration of SARS-CoV-2 viral copies this assay can detect is 138 copies/mL. A negative result does not preclude SARS-Cov-2 infection and should not be used as the sole basis for treatment or other patient management decisions. A negative result may occur with  improper specimen collection/handling, submission of specimen other than nasopharyngeal swab, presence of viral mutation(s) within the areas targeted by this assay, and inadequate number of viral copies(<138 copies/mL). A negative result must be combined with clinical observations, patient history, and epidemiological information. The expected result is Negative.  Fact Sheet for Patients:  EntrepreneurPulse.com.au  Fact Sheet for Healthcare Providers:  IncredibleEmployment.be  This test is no t yet approved or cleared by the [place] FDA and  has  been authorized for detection and/or diagnosis of SARS-CoV-2 by FDA under an Emergency Use Authorization (EUA). This EUA will remain  in effect (meaning this test can be used) for the duration of the COVID-19 declaration under Section 564(b)(1) of the Act, 21 U.S.C.section 360bbb-3(b)(1), unless the authorization is terminated  or revoked sooner.       Influenza A by PCR NEGATIVE NEGATIVE Final   Influenza B by PCR NEGATIVE NEGATIVE Final    Comment: (NOTE) The Xpert Xpress SARS-CoV-2/FLU/RSV plus assay is intended as an aid in the diagnosis of influenza from Nasopharyngeal swab specimens and should not be used as a sole basis for treatment. Nasal washings and aspirates are unacceptable for Xpert Xpress  SARS-CoV-2/FLU/RSV testing.  Fact Sheet for Patients: EntrepreneurPulse.com.au  Fact Sheet for Healthcare Providers: IncredibleEmployment.be  This test is not yet approved or cleared by the [place] FDA and has been authorized for detection and/or diagnosis of SARS-CoV-2 by FDA under an Emergency Use Authorization (EUA). This EUA will remain in effect (meaning this test can be used) for the duration of the COVID-19 declaration under Section 564(b)(1) of the Act, 21 U.S.C. section 360bbb-3(b)(1), unless the authorization is terminated or revoked.  Performed at [facility], [place] [address] [place], [place] [address]          Radiology Studies: DG Chest Portable 1 View  Result Date: 07/23/2020 CLINICAL DATA:  Dyspnea EXAM: PORTABLE CHEST 1 VIEW COMPARISON:  05/31/2017 FINDINGS: Right internal jugular central venous catheter tip is seen within the superior vena cava. Lung volumes are small and pulmonary insufflation has slightly diminished since prior examination. Superimposed asymmetric patchy pulmonary infiltrates have developed within the right lung and left lung base, likely infectious or inflammatory in nature. No pneumothorax or pleural effusion. Coronary artery bypass grafting has been performed. Cardiac size within normal limits. Atherosclerotic calcification noted within a mildly tortuous thoracic aorta. No acute bone abnormality. IMPRESSION: Interval development of patchy bilateral asymmetric pulmonary infiltrates, likely infectious or inflammatory in nature. Mild pulmonary hypoinflation.  There Electronically Signed   By: Fidela Salisbury MD   On: 07/23/2020 04:52   ECHOCARDIOGRAM COMPLETE  Result Date: 07/23/2020    ECHOCARDIOGRAM REPORT   Patient Name:   Makayla Fisher Date of Exam: 07/23/2020 Medical Rec #:  [id number]        Height:       60.0 in Accession #:    [id number]       Weight:       160.9 lb Date of Birth:  13-Feb-1923        BSA:          1.702 m Patient Age:    89 years         BP:           133/49 mmHg Patient Gender: F                HR:           57 bpm. Exam Location:  Inpatient Procedure: 2D Echo, Cardiac Doppler and Color Doppler Indications:    CHF  History:        Patient has prior history of Echocardiogram examinations, most                 recent 11/27/2019. Acute MI.  Sonographer:    Luisa Hart RDCS Referring Phys: [place]  1. Left ventricular ejection fraction, by estimation, is 50 to 55%. The left ventricle has low normal  function. The left ventricle demonstrates regional wall motion abnormalities (see scoring diagram/findings for description). Left ventricular diastolic  parameters are consistent with Grade II diastolic dysfunction (pseudonormalization). Elevated left ventricular end-diastolic pressure. There is mild hypokinesis of the left ventricular, mid-apical inferior wall, inferoseptal wall and anteroseptal wall.  2. Right ventricular systolic function is normal. The right ventricular size is normal. There is mildly elevated pulmonary artery systolic pressure.  3. Left atrial size was severely dilated.  4. Right atrial size was mild to moderately dilated.  5. The mitral valve is degenerative. Mild to moderate mitral valve regurgitation. Moderate mitral annular calcification.  6. The aortic valve is grossly normal. There is mild calcification of the aortic valve. There is mild thickening of the aortic valve. Aortic valve regurgitation is not visualized. Mild aortic valve sclerosis is present, with no evidence of aortic valve stenosis.  7. The inferior vena cava is normal in size with greater than 50% respiratory variability, suggesting right atrial pressure of 3 mmHg. Comparison(s): Changes from prior study are noted. Conclusion(s)/Recommendation(s): Focal wall motion abnormalities noted, with low normal EF and no severe valve disease. FINDINGS  Left Ventricle: Left ventricular ejection  fraction, by estimation, is 50 to 55%. The left ventricle has low normal function. The left ventricle demonstrates regional wall motion abnormalities. Mild hypokinesis of the left ventricular, mid-apical inferior wall, inferoseptal wall and anteroseptal wall. The left ventricular internal cavity size was normal in size. There is no left ventricular hypertrophy. Left ventricular diastolic parameters are consistent with Grade II diastolic dysfunction (pseudonormalization). Elevated left ventricular end-diastolic pressure. Right Ventricle: The right ventricular size is normal. No increase in right ventricular wall thickness. Right ventricular systolic function is normal. There is mildly elevated pulmonary artery systolic pressure. The tricuspid regurgitant velocity is 3.12  m/s, and with an assumed right atrial pressure of 3 mmHg, the estimated right ventricular systolic pressure is 73.5 mmHg. Left Atrium: Left atrial size was severely dilated. Right Atrium: Right atrial size was mild to moderately dilated. Pericardium: There is no evidence of pericardial effusion. Mitral Valve: The mitral valve is degenerative in appearance. There is moderate thickening of the mitral valve leaflet(s). There is moderate calcification of the mitral valve leaflet(s). Moderate mitral annular calcification. Mild to moderate mitral valve regurgitation. Tricuspid Valve: The tricuspid valve is normal in structure. Tricuspid valve regurgitation is trivial. Aortic Valve: The aortic valve is grossly normal. There is mild calcification of the aortic valve. There is mild thickening of the aortic valve. Aortic valve regurgitation is not visualized. Mild aortic valve sclerosis is present, with no evidence of aortic valve stenosis. Aortic valve mean gradient measures 7.0 mmHg. Aortic valve peak gradient measures 15.2 mmHg. Aortic valve area, by VTI measures 1.76 cm. Pulmonic Valve: The pulmonic valve was not well visualized. Pulmonic valve  regurgitation is not visualized. Aorta: The aortic root, ascending aorta and aortic arch are all structurally normal, with no evidence of dilitation or obstruction. Venous: The inferior vena cava is normal in size with greater than 50% respiratory variability, suggesting right atrial pressure of 3 mmHg. IAS/Shunts: The atrial septum is grossly normal.  LEFT VENTRICLE PLAX 2D LVIDd:         5.10 cm     Diastology LVIDs:         3.00 cm     LV e' medial:    4.19 cm/s LV PW:         0.80 cm     LV E/e' medial:  28.2 LV IVS:  0.70 cm     LV e' lateral:   7.90 cm/s LVOT diam:     1.90 cm     LV E/e' lateral: 14.9 LV SV:         75 LV SV Index:   44 LVOT Area:     2.84 cm  LV Volumes (MOD) LV vol d, MOD A2C: 50.5 ml LV vol d, MOD A4C: 62.3 ml LV vol s, MOD A2C: 30.7 ml LV vol s, MOD A4C: 35.6 ml LV SV MOD A2C:     19.8 ml LV SV MOD A4C:     62.3 ml LV SV MOD BP:      21.9 ml RIGHT VENTRICLE RV S prime:     8.31 cm/s TAPSE (M-mode): 1.7 cm LEFT ATRIUM             Index       RIGHT ATRIUM           Index LA Vol (A2C):   96.5 ml 56.70 ml/m RA Area:     13.40 cm LA Vol (A4C):   86.6 ml 50.88 ml/m RA Volume:   30.80 ml  18.10 ml/m LA Biplane Vol: 94.1 ml 55.29 ml/m  AORTIC VALVE                    PULMONIC VALVE AV Area (Vmax):    1.79 cm     PV Vmax:       0.71 m/s AV Area (Vmean):   1.86 cm     PV Peak grad:  2.0 mmHg AV Area (VTI):     1.76 cm AV Vmax:           195.00 cm/s AV Vmean:          125.000 cm/s AV VTI:            0.428 m AV Peak Grad:      15.2 mmHg AV Mean Grad:      7.0 mmHg LVOT Vmax:         123.00 cm/s LVOT Vmean:        81.900 cm/s LVOT VTI:          0.265 m LVOT/AV VTI ratio: 0.62  AORTA Ao Asc diam: 3.00 cm MITRAL VALVE                 TRICUSPID VALVE MV Area (PHT): 3.42 cm      TR Peak grad:   38.9 mmHg MV Decel Time: 222 msec      TR Vmax:        312.00 cm/s MR Peak grad:    88.7 mmHg MR Vmax:         471.00 cm/s SHUNTS MR PISA:         1.57 cm    Systemic VTI:  0.26 m MR PISA Eff ROA:  8 mm       Systemic Diam: 1.90 cm MR PISA Radius:  0.50 cm MV E velocity: 118.00 cm/s MV A velocity: 94.10 cm/s MV E/A ratio:  1.25 Buford Dresser MD Electronically signed by Buford Dresser MD Signature Date/Time: 07/23/2020/8:13:32 PM    Final         Scheduled Meds: . aspirin EC  81 mg Oral Daily  . atorvastatin  40 mg Oral QHS  . clopidogrel  75 mg Oral Daily  . docusate sodium  100 mg Oral BID  . feeding supplement  237 mL Oral BID BM  . furosemide  40 mg Intravenous BID  . levothyroxine  50 mcg Oral QAC breakfast  . lisinopril  20 mg Oral Daily  . multivitamin with minerals  1 tablet Oral Daily  . psyllium  1 packet Oral Q M,W,F  . rOPINIRole  1 mg Oral QHS  . sertraline  25 mg Oral Daily  . sodium chloride flush  10-40 mL Intracatheter Q12H  . sodium chloride flush  3 mL Intravenous Q12H  . timolol  1 drop Both Eyes BID  . umeclidinium bromide  1 puff Inhalation Daily   Continuous Infusions: . argatroban 1.44 mcg/kg/min (07/24/20 0850)     LOS: 1 day    Time spent: 35 minutes    Dana Allan, MD  [facility] Pager #: [phone] 7PM-7AM contact night coverage as above

## 2020-07-24 NOTE — Evaluation (Signed)
Occupational Therapy Evaluation Patient Details Name: Makayla Fisher MRN: [id number] DOB: Nov 18, 1922 Today's Date: 07/24/2020    History of Present Illness Pt is a 87 y/o female admitted secondary to increased SOB. Thought to be secondary to CHF exacerbation. PMH includes CAD s/p CABG, CVA, and s/p colostomy.   Clinical Impression   Pt admitted with the above diagnoses and presents with below problem list. Pt will benefit from continued acute OT to address the below listed deficits and maximize independence with basic ADLs prior to d/c back to ALF. At baseline, pt is mod I with ADLs. Pt currently needs up to min guard assist with LB ADLs and functional transfers (ie toilet, shower, etc), min A with bed mobility. Pt tolerated session well. Up in recliner at end of session.       Follow Up Recommendations  Home health OT;Supervision - Intermittent    Equipment Recommendations  None recommended by OT    Recommendations for Other Services       Precautions / Restrictions Precautions Precautions: Fall Restrictions Weight Bearing Restrictions: No      Mobility Bed Mobility Overal bed mobility: Needs Assistance Bed Mobility: Supine to Sit     Supine to sit: Min assist     General bed mobility comments: Min A for trunk elevation. Increased time and effort    Transfers Overall transfer level: Needs assistance Equipment used: Rolling walker (2 wheeled) Transfers: Sit to/from [facility] Sit to Stand: Min guard Stand pivot transfers: Min guard       General transfer comment: min guard for safety    Balance Overall balance assessment: Needs assistance Sitting-balance support: No upper extremity supported;Feet supported Sitting balance-Leahy Scale: Good     Standing balance support: Bilateral upper extremity supported;During functional activity Standing balance-Leahy Scale: Poor Standing balance comment: Reliant on BUE support                            ADL either performed or assessed with clinical judgement   ADL Overall ADL's : Needs assistance/impaired Eating/Feeding: Set up;Sitting   Grooming: Set up;Sitting   Upper Body Bathing: Set up;Min guard;Sitting   Lower Body Bathing: Min guard;Sit to/from stand   Upper Body Dressing : Set up;Sitting   Lower Body Dressing: Min guard;Sit to/from stand   Toilet Transfer: Min guard;Ambulation   Toileting- Clothing Manipulation and Hygiene: Min guard;Sitting/lateral lean;Sit to/from stand   Tub/ Shower Transfer: Min guard;Walk-in shower;Ambulation;Rolling walker;Shower seat     General ADL Comments: Pt compelted bed mobility, sat EOB a few minutes then took pivotal steps to recliner. Limited by incontinence + on Lasix.     Vision Baseline Vision/History: Wears glasses Wears Glasses: At all times       Perception     Praxis      Pertinent Vitals/Pain       Hand Dominance Right   Extremity/Trunk Assessment Upper Extremity Assessment Upper Extremity Assessment: Generalized weakness   Lower Extremity Assessment Lower Extremity Assessment: Generalized weakness;Defer to PT evaluation       Communication Communication Communication: HOH   Cognition Arousal/Alertness: Awake/alert Behavior During Therapy: WFL for tasks assessed/performed Overall Cognitive Status: Within Functional Limits for tasks assessed                                     General Comments  urinary incontinence + on Lasix  Exercises     Shoulder Instructions      Home Living Family/patient expects to be discharged to:: Assisted living                             Home Equipment: Walker - 4 wheels;Grab bars - toilet;Grab bars - tub/shower;Shower seat   Additional Comments: From [facility]      Prior Functioning/Environment Level of Independence: Independent with assistive device(s);Needs assistance  Gait / Transfers Assistance Needed: Uses  rollator to ambulate at ALF ADL's / Homemaking Assistance Needed: Pt reports independence with dressing and bathing PTA, states all meals are prepared for her and she takes her meals in dining room.            OT Problem List: Impaired balance (sitting and/or standing);Decreased knowledge of use of DME or AE;Decreased knowledge of precautions;Decreased strength;Decreased activity tolerance      OT Treatment/Interventions: Self-care/ADL training;Therapeutic exercise;Energy conservation;DME and/or AE instruction;Therapeutic activities;Balance training;Patient/family education    OT Goals(Current goals can be found in the care plan section) Acute Rehab OT Goals Patient Stated Goal: to feel better OT Goal Formulation: With patient Time For Goal Achievement: 08/07/20 Potential to Achieve Goals: Good ADL Goals Pt Will Perform Lower Body Bathing: with modified independence;with supervision;sit to/from stand Pt Will Perform Lower Body Dressing: with modified independence;with supervision;sit to/from stand Pt Will Transfer to Toilet: with modified independence;ambulating Pt Will Perform Toileting - Clothing Manipulation and hygiene: sit to/from stand;with supervision Pt Will Perform Tub/Shower Transfer: Shower transfer;with supervision;ambulating;rolling walker;shower seat Additional ADL Goal #1: Pt will complete bed mobility at mod I level to prepare fpr EOB/OOB ADLs.  OT Frequency: Min 2X/week   Barriers to D/C:            Co-evaluation              AM-PAC OT "6 Clicks" Daily Activity     Outcome Measure Help from another person eating meals?: None Help from another person taking care of personal grooming?: None Help from another person toileting, which includes using toliet, bedpan, or urinal?: A Little Help from another person bathing (including washing, rinsing, drying)?: A Little Help from another person to put on and taking off regular upper body clothing?: None Help from  another person to put on and taking off regular lower body clothing?: A Little 6 Click Score: 21   End of Session Equipment Utilized During Treatment: Rolling walker  Activity Tolerance: Patient tolerated treatment well Patient left: in chair;with call bell/phone within reach;with chair alarm set  OT Visit Diagnosis: Unsteadiness on feet (R26.81);Muscle weakness (generalized) (M62.81)                Time: 7616-0737 OT Time Calculation (min): 25 min Charges:  OT General Charges $OT Visit: 1 Visit OT Evaluation $OT Eval Low Complexity: 1 Low OT Treatments $Self Care/Home Management : 8-22 mins  Tyrone Schimke, OT Acute Rehabilitation Services Pager: [phone] Office: [phone]   Makayla Fisher 07/24/2020, 11:05 AM

## 2020-07-24 NOTE — Progress Notes (Signed)
Pt was hooked on tele box 25 when I received pt. Showing NSR. However, it was not called in to  [place]. Resolved.

## 2020-07-24 NOTE — Progress Notes (Signed)
Progress Note  Patient Name: Makayla Fisher Date of Encounter: 07/24/2020  Primary Cardiologist: Jenkins Rouge, MD   Subjective   No events overnight. Blood pressure much improved from prior.  Full echo shows small apical WMA.  Patient feels fine.  Nervous about whether she will be feeling normal when she leaves  Inpatient Medications    Scheduled Meds:  atorvastatin  10 mg Oral QHS   clopidogrel  75 mg Oral Daily   docusate sodium  100 mg Oral BID   furosemide  40 mg Intravenous BID   levothyroxine  50 mcg Oral QAC breakfast   lisinopril  20 mg Oral Daily   multivitamin with minerals  1 tablet Oral Daily   psyllium  1 packet Oral Q M,W,F   rOPINIRole  1 mg Oral QHS   sertraline  25 mg Oral Daily   sodium chloride flush  10-40 mL Intracatheter Q12H   sodium chloride flush  3 mL Intravenous Q12H   timolol  1 drop Both Eyes BID   umeclidinium bromide  1 puff Inhalation Daily   Continuous Infusions:  argatroban 1.44 mcg/kg/min (07/24/20 0850)   PRN Meds: acetaminophen **OR** acetaminophen, bisacodyl, hydrALAZINE, morphine injection, ondansetron **OR** ondansetron (ZOFRAN) IV, oxyCODONE, polyethylene glycol, psyllium, sodium chloride flush, traZODone   Vital Signs    Vitals:   07/24/20 0002 07/24/20 0500 07/24/20 0522 07/24/20 0755  BP:   130/62 (!) 114/42  Pulse:   (!) 59 62  Resp:   17 16  Temp:   98.5 F (36.9 C) 98.3 F (36.8 C)  TempSrc:   Oral Oral  SpO2:   95% 95%  Weight: 72.1 kg 72.1 kg      Intake/Output Summary (Last 24 hours) at 07/24/2020 0916 Last data filed at 07/24/2020 0756 Gross per 24 hour  Intake --  Output 425 ml  Net -425 ml   Filed Weights   07/23/20 1200 07/24/20 0002 07/24/20 0500  Weight: 73 kg 72.1 kg 72.1 kg    Telemetry    SR - Personally Reviewed  ECG    No new - Personally Reviewed  Physical Exam   GEN: No acute distress.   Neck: mild JVD to lower neck persists Cardiac: RRR, II/VI systolic murmur no rubs, or gallops.   Respiratory: Clear to auscultation bilaterally poor effort GI: Soft, nontender, non-distended  MS: No edema; No deformity. Neuro:  Nonfocal  Psych: Normal affect   Labs    Chemistry Recent Labs  Lab 07/23/20 0500 07/24/20 0058  NA 140 136  K 3.9 3.1*  CL 104 98  CO2 25 29  GLUCOSE 140* 99  BUN 14 14  CREATININE 0.95 0.88  CALCIUM 10.0 9.1  PROT 6.1*  --   ALBUMIN 3.7  --   AST 22  --   ALT 12  --   ALKPHOS 57  --   BILITOT 1.0  --   GFRNONAA 55* >60  ANIONGAP 11 9     Hematology Recent Labs  Lab 07/23/20 0500 07/24/20 0058  WBC 10.1 8.7  RBC 4.46 4.08  HGB 14.1 13.2  HCT 45.0 39.0  MCV 100.9* 95.6  MCH 31.6 32.4  MCHC 31.3 33.8  RDW 14.5 14.3  PLT 140* 129*    Cardiac EnzymesNo results for input(s): TROPONINI in the last 168 hours. No results for input(s): TROPIPOC in the last 168 hours.   BNP Recent Labs  Lab 07/23/20 0500  BNP 517.5*     DDimer No results for input(s):  DDIMER in the last 168 hours.   Radiology    DG Chest Portable 1 View  Result Date: 07/23/2020 CLINICAL DATA:  Dyspnea EXAM: PORTABLE CHEST 1 VIEW COMPARISON:  05/31/2017 FINDINGS: Right internal jugular central venous catheter tip is seen within the superior vena cava. Lung volumes are small and pulmonary insufflation has slightly diminished since prior examination. Superimposed asymmetric patchy pulmonary infiltrates have developed within the right lung and left lung base, likely infectious or inflammatory in nature. No pneumothorax or pleural effusion. Coronary artery bypass grafting has been performed. Cardiac size within normal limits. Atherosclerotic calcification noted within a mildly tortuous thoracic aorta. No acute bone abnormality. IMPRESSION: Interval development of patchy bilateral asymmetric pulmonary infiltrates, likely infectious or inflammatory in nature. Mild pulmonary hypoinflation.  There Electronically Signed   By: Fidela Salisbury MD   On: 07/23/2020 04:52    ECHOCARDIOGRAM COMPLETE  Result Date: 07/23/2020    ECHOCARDIOGRAM REPORT   Patient Name:   Makayla Fisher Date of Exam: 07/23/2020 Medical Rec #:  [id number]        Height:       60.0 in Accession #:    [id number]       Weight:       160.9 lb Date of Birth:  February 26, 1923       BSA:          1.702 m Patient Age:    87 years         BP:           133/49 mmHg Patient Gender: F                HR:           57 bpm. Exam Location:  Inpatient Procedure: 2D Echo, Cardiac Doppler and Color Doppler Indications:    CHF  History:        Patient has prior history of Echocardiogram examinations, most                 recent 11/27/2019. Acute MI.  Sonographer:    Luisa Hart RDCS Referring Phys: [place]  1. Left ventricular ejection fraction, by estimation, is 50 to 55%. The left ventricle has low normal function. The left ventricle demonstrates regional wall motion abnormalities (see scoring diagram/findings for description). Left ventricular diastolic  parameters are consistent with Grade II diastolic dysfunction (pseudonormalization). Elevated left ventricular end-diastolic pressure. There is mild hypokinesis of the left ventricular, mid-apical inferior wall, inferoseptal wall and anteroseptal wall.  2. Right ventricular systolic function is normal. The right ventricular size is normal. There is mildly elevated pulmonary artery systolic pressure.  3. Left atrial size was severely dilated.  4. Right atrial size was mild to moderately dilated.  5. The mitral valve is degenerative. Mild to moderate mitral valve regurgitation. Moderate mitral annular calcification.  6. The aortic valve is grossly normal. There is mild calcification of the aortic valve. There is mild thickening of the aortic valve. Aortic valve regurgitation is not visualized. Mild aortic valve sclerosis is present, with no evidence of aortic valve stenosis.  7. The inferior vena cava is normal in size with greater than 50% respiratory  variability, suggesting right atrial pressure of 3 mmHg. Comparison(s): Changes from prior study are noted. Conclusion(s)/Recommendation(s): Focal wall motion abnormalities noted, with low normal EF and no severe valve disease. FINDINGS  Left Ventricle: Left ventricular ejection fraction, by estimation, is 50 to 55%. The left ventricle has low normal function. The left ventricle  demonstrates regional wall motion abnormalities. Mild hypokinesis of the left ventricular, mid-apical inferior wall, inferoseptal wall and anteroseptal wall. The left ventricular internal cavity size was normal in size. There is no left ventricular hypertrophy. Left ventricular diastolic parameters are consistent with Grade II diastolic dysfunction (pseudonormalization). Elevated left ventricular end-diastolic pressure. Right Ventricle: The right ventricular size is normal. No increase in right ventricular wall thickness. Right ventricular systolic function is normal. There is mildly elevated pulmonary artery systolic pressure. The tricuspid regurgitant velocity is 3.12  m/s, and with an assumed right atrial pressure of 3 mmHg, the estimated right ventricular systolic pressure is 26.7 mmHg. Left Atrium: Left atrial size was severely dilated. Right Atrium: Right atrial size was mild to moderately dilated. Pericardium: There is no evidence of pericardial effusion. Mitral Valve: The mitral valve is degenerative in appearance. There is moderate thickening of the mitral valve leaflet(s). There is moderate calcification of the mitral valve leaflet(s). Moderate mitral annular calcification. Mild to moderate mitral valve regurgitation. Tricuspid Valve: The tricuspid valve is normal in structure. Tricuspid valve regurgitation is trivial. Aortic Valve: The aortic valve is grossly normal. There is mild calcification of the aortic valve. There is mild thickening of the aortic valve. Aortic valve regurgitation is not visualized. Mild aortic valve  sclerosis is present, with no evidence of aortic valve stenosis. Aortic valve mean gradient measures 7.0 mmHg. Aortic valve peak gradient measures 15.2 mmHg. Aortic valve area, by VTI measures 1.76 cm. Pulmonic Valve: The pulmonic valve was not well visualized. Pulmonic valve regurgitation is not visualized. Aorta: The aortic root, ascending aorta and aortic arch are all structurally normal, with no evidence of dilitation or obstruction. Venous: The inferior vena cava is normal in size with greater than 50% respiratory variability, suggesting right atrial pressure of 3 mmHg. IAS/Shunts: The atrial septum is grossly normal.  LEFT VENTRICLE PLAX 2D LVIDd:         5.10 cm     Diastology LVIDs:         3.00 cm     LV e' medial:    4.19 cm/s LV PW:         0.80 cm     LV E/e' medial:  28.2 LV IVS:        0.70 cm     LV e' lateral:   7.90 cm/s LVOT diam:     1.90 cm     LV E/e' lateral: 14.9 LV SV:         75 LV SV Index:   44 LVOT Area:     2.84 cm  LV Volumes (MOD) LV vol d, MOD A2C: 50.5 ml LV vol d, MOD A4C: 62.3 ml LV vol s, MOD A2C: 30.7 ml LV vol s, MOD A4C: 35.6 ml LV SV MOD A2C:     19.8 ml LV SV MOD A4C:     62.3 ml LV SV MOD BP:      21.9 ml RIGHT VENTRICLE RV S prime:     8.31 cm/s TAPSE (M-mode): 1.7 cm LEFT ATRIUM             Index       RIGHT ATRIUM           Index LA Vol (A2C):   96.5 ml 56.70 ml/m RA Area:     13.40 cm LA Vol (A4C):   86.6 ml 50.88 ml/m RA Volume:   30.80 ml  18.10 ml/m LA Biplane Vol: 94.1 ml 55.29 ml/m  AORTIC VALVE  PULMONIC VALVE AV Area (Vmax):    1.79 cm     PV Vmax:       0.71 m/s AV Area (Vmean):   1.86 cm     PV Peak grad:  2.0 mmHg AV Area (VTI):     1.76 cm AV Vmax:           195.00 cm/s AV Vmean:          125.000 cm/s AV VTI:            0.428 m AV Peak Grad:      15.2 mmHg AV Mean Grad:      7.0 mmHg LVOT Vmax:         123.00 cm/s LVOT Vmean:        81.900 cm/s LVOT VTI:          0.265 m LVOT/AV VTI ratio: 0.62  AORTA Ao Asc diam: 3.00 cm MITRAL  VALVE                 TRICUSPID VALVE MV Area (PHT): 3.42 cm      TR Peak grad:   38.9 mmHg MV Decel Time: 222 msec      TR Vmax:        312.00 cm/s MR Peak grad:    88.7 mmHg MR Vmax:         471.00 cm/s SHUNTS MR PISA:         1.57 cm    Systemic VTI:  0.26 m MR PISA Eff ROA: 8 mm       Systemic Diam: 1.90 cm MR PISA Radius:  0.50 cm MV E velocity: 118.00 cm/s MV A velocity: 94.10 cm/s MV E/A ratio:  1.25 Buford Dresser MD Electronically signed by Buford Dresser MD Signature Date/Time: 07/23/2020/8:13:32 PM    Final     Cardiac Studies   1. Left ventricular ejection fraction, by estimation, is 50 to 55%. The  left ventricle has low normal function. The left ventricle demonstrates  regional wall motion abnormalities (see scoring diagram/findings for  description). Left ventricular diastolic   parameters are consistent with Grade II diastolic dysfunction  (pseudonormalization). Elevated left ventricular end-diastolic pressure.  There is mild hypokinesis of the left ventricular, mid-apical inferior  wall, inferoseptal wall and anteroseptal wall.   2. Right ventricular systolic function is normal. The right ventricular  size is normal. There is mildly elevated pulmonary artery systolic  pressure.   3. Left atrial size was severely dilated.   4. Right atrial size was mild to moderately dilated.   5. The mitral valve is degenerative. Mild to moderate mitral valve  regurgitation. Moderate mitral annular calcification.   6. The aortic valve is grossly normal. There is mild calcification of the  aortic valve. There is mild thickening of the aortic valve. Aortic valve  regurgitation is not visualized. Mild aortic valve sclerosis is present,  with no evidence of aortic valve  stenosis.   7. The inferior vena cava is normal in size with greater than 50%  respiratory variability, suggesting right atrial pressure of 3 mmHg  Patient Profile     86 y.o. female HTN, Non-Hodking's  Lymphoma on Rituxan; HIT and prior CAD s/p CABG with NSTEMI  Assessment & Plan    NSTEMI HIT HTN Moderate to Severe MR and hypokalemia with diuresis - will plan for DAPT (ASA ordered) for one year - will continue argatroban for 48 hours - will transition to high dose statin) - given prior BB  intolerance will not re-challenge - continue home ACEi - given age and other comorbidity; conservative therapy is planned - continue one more day of diuresis with PO transition likely 07/25/20 - repleted K     For questions or updates, please contact [place] Please consult [email or web address] for contact info under Cardiology/STEMI.      Signed, Werner Lean, MD  07/24/2020, 9:16 AM

## 2020-07-24 NOTE — Progress Notes (Signed)
Makayla Fisher for Argatroban Indication: chest pain/ACS  Allergies  Allergen Reactions  . Epipen [Epinephrine Hcl (Nasal)] Palpitations  . Levaquin [Levofloxacin In D5w] Other (See Comments)    Weakness- "Allergic," per MAR  . Beta Adrenergic Blockers Other (See Comments)    intolerant to higher doses of BB due to bradycardia.  . Chlorhexidine Gluconate Other (See Comments)    Pt declined use and suspects she may have an intolerance  . Heparin Other (See Comments)    "Alleregic," pre MAR- Maybe affected platelets, per patient  . Sulfa Antibiotics Rash    Patient Measurements: Weight 73 kg   Vital Signs: Temp: 98 F (36.7 C) (04/01 1134) Temp Source: Oral (04/01 0755) BP: 114/39 (04/01 1134) Pulse Rate: 58 (04/01 1134)  Labs: Recent Labs    07/23/20 0500 07/23/20 0629 07/23/20 1022 07/23/20 1220 07/23/20 1838 07/24/20 0058 07/24/20 0658 07/24/20 1303  HGB 14.1  --   --   --   --  13.2  --   --   HCT 45.0  --   --   --   --  39.0  --   --   PLT 140*  --   --   --   --  129*  --   --   APTT  --   --   --   --    < > 44* 49* 31  CREATININE 0.95  --   --   --   --  0.88  --   --   TROPONINIHS 221* 895* 2,058* 1,762*  --   --   --   --    < > = values in this interval not displayed.    Estimated Creatinine Clearance: 33.9 mL/min (by C-G formula based on SCr of 0.88 mg/dL).   Medical History: Past Medical History:  Diagnosis Date  . GERD (gastroesophageal reflux disease)   . Heart attack ([place]) 2009  . Heparin allergy 07/23/2020  . History of blood transfusion 2009  . Iron deficiency anemia due to chronic blood loss 08/07/2014  . NHL (non-Hodgkin's lymphoma) ([place]) 10/18/2013   finished chemo dec 2015, maintenanance retuxin    Medications:  Medications Prior to Admission  Medication Sig Dispense Refill Last Dose  . atorvastatin (LIPITOR) 10 MG tablet Take 1 tablet (10 mg total) by mouth daily. (Patient taking differently: Take  10 mg by mouth at bedtime.) 30 tablet 2 07/22/2020 at Unknown time  . Calcium Carb-Cholecalciferol (CALCIUM 600+D3 PO) Take 1 tablet by mouth at bedtime.   07/22/2020 at Unknown time  . Cholecalciferol (VITAMIN D3 SUPER STRENGTH) 50 MCG (2000 UT) CAPS Take 4,000 Units by mouth daily.    07/22/2020 at Unknown time  . clopidogrel (PLAVIX) 75 MG tablet Take 1 tablet (75 mg total) by mouth daily. 30 tablet 2 07/22/2020 at Unknown time  . Cranberry 425 MG CAPS Take 425 mg by mouth daily.    07/22/2020 at Unknown time  . levothyroxine (SYNTHROID, LEVOTHROID) 50 MCG tablet Take 50 mcg by mouth daily before breakfast.    07/22/2020 at Unknown time  . lidocaine-prilocaine (EMLA) cream Apply 1 application topically See admin instructions. Apply to port-a-cath one hour prior to procedure   07/16/2020  . lisinopril (PRINIVIL,ZESTRIL) 20 MG tablet Take 1 tablet (20 mg total) by mouth daily. 30 tablet 3 07/22/2020 at Unknown time  . Multiple Vitamin (DAILY VITE PO) Take 1 tablet by mouth See admin instructions. Daily at 12 noon  07/22/2020 at Unknown time  . Multiple Vitamins-Minerals (PRESERVISION AREDS PO) Take 1 tablet by mouth in the morning and at bedtime.   07/22/2020 at Unknown time  . nystatin cream (MYCOSTATIN) Apply 1 application topically every 12 (twelve) hours as needed (to rash between the legs and groin).   unk  . psyllium (METAMUCIL) 58.6 % packet Take 1 packet by mouth every Monday, Wednesday, and Friday. ORANGE   07/22/2020 at Unknown time  . psyllium (METAMUCIL) 58.6 % packet Take 1 packet by mouth daily as needed (constipation).   unk  . rOPINIRole (REQUIP) 1 MG tablet Take 1 mg by mouth at bedtime.   07/21/2020  . sertraline (ZOLOFT) 25 MG tablet Take 25 mg by mouth daily.   07/22/2020 at Unknown time  . SPIRIVA HANDIHALER 18 MCG inhalation capsule Place 18 mcg into inhaler and inhale daily.    07/22/2020 at Unknown time  . timolol (TIMOPTIC) 0.5 % ophthalmic solution Place 1 drop into both eyes 2 (two)  times daily. Affected eye(s)   07/22/2020 at Unknown time    Assessment: 89 YOF who presents with shortness of breath with rising troponins. Pharmacy consulted on 07/23/20 to start argatroban in the setting of heparin allergy.  Heparin allergy per MAR at  [facility]). Admit  H/H wnl, Plt low, LFTs wnl   Of note, patient received one dose of prophylactic Arixtra on 3/31.   This AM  PTT = 49 seconds, subtherapeutic on Argatroban 1.2 mckg/kg/min. Thus increased Argatroban rate by 20 % per protocol to 1.44 mcg/kg/min.   Next  4h PTT  Decreased significantly 31seconds  despite increased rate to 1.44 mcg/kg/min.  No issues with infusion via port, PTT drawn from peripheral site and no bleeding seen per discussion with RN.  H/o iron deficiency anemia. H/H remains wnl, Plt low 140 on admit >129k.    Goal of Therapy:  aPTT 50-90 seconds Monitor platelets by anticoagulation protocol: Yes   Plan: Increase argatroban gtt per protocol to 1.8 mcg/kg/min F/u 4 hour aPTT.  CBC daily  Monitor for bleeding.   Thank you for allowing pharmacy to be part of this patients care team.  Nicole Cella, [facility] Pharmacist [phone] Please check AMION for all [facility] phone numbers After 10:00 PM, call [facility] [phone] 07/24/2020 3:37 PM

## 2020-07-25 DIAGNOSIS — I160 Hypertensive urgency: Secondary | ICD-10-CM | POA: Diagnosis not present

## 2020-07-25 DIAGNOSIS — I214 Non-ST elevation (NSTEMI) myocardial infarction: Secondary | ICD-10-CM | POA: Diagnosis not present

## 2020-07-25 DIAGNOSIS — D7582 Heparin induced thrombocytopenia (HIT): Secondary | ICD-10-CM

## 2020-07-25 LAB — CBC
HCT: 40.8 % (ref 36.0–46.0)
Hemoglobin: 13.5 g/dL (ref 12.0–15.0)
MCH: 32.1 pg (ref 26.0–34.0)
MCHC: 33.1 g/dL (ref 30.0–36.0)
MCV: 96.9 fL (ref 80.0–100.0)
Platelets: 142 10*3/uL — ABNORMAL LOW (ref 150–400)
RBC: 4.21 MIL/uL (ref 3.87–5.11)
RDW: 14.6 % (ref 11.5–15.5)
WBC: 9 10*3/uL (ref 4.0–10.5)
nRBC: 0 % (ref 0.0–0.2)

## 2020-07-25 LAB — BASIC METABOLIC PANEL
Anion gap: 9 (ref 5–15)
BUN: 33 mg/dL — ABNORMAL HIGH (ref 8–23)
CO2: 28 mmol/L (ref 22–32)
Calcium: 9.2 mg/dL (ref 8.9–10.3)
Chloride: 101 mmol/L (ref 98–111)
Creatinine, Ser: 1.53 mg/dL — ABNORMAL HIGH (ref 0.44–1.00)
GFR, Estimated: 31 mL/min — ABNORMAL LOW (ref >60)
Glucose, Bld: 101 mg/dL — ABNORMAL HIGH (ref 70–99)
Potassium: 4.4 mmol/L (ref 3.5–5.1)
Sodium: 138 mmol/L (ref 135–145)

## 2020-07-25 LAB — APTT
aPTT: 52 seconds — ABNORMAL HIGH (ref 24–36)
aPTT: 54 seconds — ABNORMAL HIGH (ref 24–36)

## 2020-07-25 MED ORDER — FUROSEMIDE 40 MG PO TABS: 40.0000 mg | ORAL_TABLET | Freq: Every day | ORAL | Status: DC

## 2020-07-25 NOTE — Progress Notes (Signed)
Progress Note  Patient Name: Makayla Fisher Date of Encounter: 07/25/2020  Primary Cardiologist: Jenkins Rouge, MD   Subjective   No events overnight.  Patient notes she has her teeth back.  Notes that she was able to walk from the bed to the chair without issues.  On no O2.  No CP.  Inpatient Medications    Scheduled Meds: . aspirin EC  81 mg Oral Daily  . atorvastatin  40 mg Oral QHS  . clopidogrel  75 mg Oral Daily  . docusate sodium  100 mg Oral BID  . feeding supplement  237 mL Oral BID BM  . furosemide  40 mg Oral Daily  . levothyroxine  50 mcg Oral QAC breakfast  . lisinopril  20 mg Oral Daily  . multivitamin with minerals  1 tablet Oral Daily  . psyllium  1 packet Oral Q M,W,F  . rOPINIRole  1 mg Oral QHS  . sertraline  25 mg Oral Daily  . sodium chloride flush  10-40 mL Intracatheter Q12H  . sodium chloride flush  3 mL Intravenous Q12H  . timolol  1 drop Both Eyes BID  . umeclidinium bromide  1 puff Inhalation Daily   Continuous Infusions:  PRN Meds: acetaminophen **OR** acetaminophen, bisacodyl, hydrALAZINE, morphine injection, ondansetron **OR** ondansetron (ZOFRAN) IV, oxyCODONE, polyethylene glycol, psyllium, sodium chloride flush, traZODone   Vital Signs    Vitals:   07/24/20 1134 07/24/20 1935 07/25/20 0503 07/25/20 0913  BP: (!) 114/39 (!) 138/50 (!) 120/12 104/75  Pulse: (!) 58 75 78 76  Resp: 16 18 18 16   Temp: 98 F (36.7 C) 98.2 F (36.8 C) 98.4 F (36.9 C) 97.8 F (36.6 C)  TempSrc:  Oral Oral Oral  SpO2: 94% 92% 94% 91%  Weight:   70.5 kg     Intake/Output Summary (Last 24 hours) at 07/25/2020 0928 Last data filed at 07/25/2020 0604 Gross per 24 hour  Intake 563.21 ml  Output --  Net 563.21 ml   Filed Weights   07/24/20 0002 07/24/20 0500 07/25/20 0503  Weight: 72.1 kg 72.1 kg 70.5 kg    Telemetry    SR - Personally Reviewed  ECG    No new - Personally Reviewed  Physical Exam   GEN: No acute distress.   Neck: No  JVD Cardiac: RRR, II/VI systolic murmur no rubs, or gallops.  Respiratory: Clear to auscultation reasonable effort GI: Soft, nontender, non-distended  MS: No edema; No deformity. Neuro:  Nonfocal  Psych: Normal affect   Labs    Chemistry Recent Labs  Lab 07/23/20 0500 07/24/20 0058  NA 140 136  K 3.9 3.1*  CL 104 98  CO2 25 29  GLUCOSE 140* 99  BUN 14 14  CREATININE 0.95 0.88  CALCIUM 10.0 9.1  PROT 6.1*  --   ALBUMIN 3.7  --   AST 22  --   ALT 12  --   ALKPHOS 57  --   BILITOT 1.0  --   GFRNONAA 55* >60  ANIONGAP 11 9     Hematology Recent Labs  Lab 07/23/20 0500 07/24/20 0058 07/25/20 0008  WBC 10.1 8.7 9.0  RBC 4.46 4.08 4.21  HGB 14.1 13.2 13.5  HCT 45.0 39.0 40.8  MCV 100.9* 95.6 96.9  MCH 31.6 32.4 32.1  MCHC 31.3 33.8 33.1  RDW 14.5 14.3 14.6  PLT 140* 129* 142*    Cardiac EnzymesNo results for input(s): TROPONINI in the last 168 hours. No results for input(s):  TROPIPOC in the last 168 hours.   BNP Recent Labs  Lab 07/23/20 0500  BNP 517.5*     DDimer No results for input(s): DDIMER in the last 168 hours.   Radiology    ECHOCARDIOGRAM COMPLETE  Result Date: 07/23/2020    ECHOCARDIOGRAM REPORT   Patient Name:   Makayla Fisher Date of Exam: 07/23/2020 Medical Rec #:  [id number]        Height:       60.0 in Accession #:    [id number]       Weight:       160.9 lb Date of Birth:  [birth year]       BSA:          1.702 m Patient Age:    87 years         BP:           133/49 mmHg Patient Gender: F                HR:           57 bpm. Exam Location:  Inpatient Procedure: 2D Echo, Cardiac Doppler and Color Doppler Indications:    CHF  History:        Patient has prior history of Echocardiogram examinations, most                 recent 11/27/2019. Acute MI.  Sonographer:    Luisa Hart RDCS Referring Phys: [place]  1. Left ventricular ejection fraction, by estimation, is 50 to 55%. The left ventricle has low normal function. The left  ventricle demonstrates regional wall motion abnormalities (see scoring diagram/findings for description). Left ventricular diastolic  parameters are consistent with Grade II diastolic dysfunction (pseudonormalization). Elevated left ventricular end-diastolic pressure. There is mild hypokinesis of the left ventricular, mid-apical inferior wall, inferoseptal wall and anteroseptal wall.  2. Right ventricular systolic function is normal. The right ventricular size is normal. There is mildly elevated pulmonary artery systolic pressure.  3. Left atrial size was severely dilated.  4. Right atrial size was mild to moderately dilated.  5. The mitral valve is degenerative. Mild to moderate mitral valve regurgitation. Moderate mitral annular calcification.  6. The aortic valve is grossly normal. There is mild calcification of the aortic valve. There is mild thickening of the aortic valve. Aortic valve regurgitation is not visualized. Mild aortic valve sclerosis is present, with no evidence of aortic valve stenosis.  7. The inferior vena cava is normal in size with greater than 50% respiratory variability, suggesting right atrial pressure of 3 mmHg. Comparison(s): Changes from prior study are noted. Conclusion(s)/Recommendation(s): Focal wall motion abnormalities noted, with low normal EF and no severe valve disease. FINDINGS  Left Ventricle: Left ventricular ejection fraction, by estimation, is 50 to 55%. The left ventricle has low normal function. The left ventricle demonstrates regional wall motion abnormalities. Mild hypokinesis of the left ventricular, mid-apical inferior wall, inferoseptal wall and anteroseptal wall. The left ventricular internal cavity size was normal in size. There is no left ventricular hypertrophy. Left ventricular diastolic parameters are consistent with Grade II diastolic dysfunction (pseudonormalization). Elevated left ventricular end-diastolic pressure. Right Ventricle: The right ventricular size  is normal. No increase in right ventricular wall thickness. Right ventricular systolic function is normal. There is mildly elevated pulmonary artery systolic pressure. The tricuspid regurgitant velocity is 3.12  m/s, and with an assumed right atrial pressure of 3 mmHg, the estimated right ventricular systolic pressure is 56.3 mmHg. Left  Atrium: Left atrial size was severely dilated. Right Atrium: Right atrial size was mild to moderately dilated. Pericardium: There is no evidence of pericardial effusion. Mitral Valve: The mitral valve is degenerative in appearance. There is moderate thickening of the mitral valve leaflet(s). There is moderate calcification of the mitral valve leaflet(s). Moderate mitral annular calcification. Mild to moderate mitral valve regurgitation. Tricuspid Valve: The tricuspid valve is normal in structure. Tricuspid valve regurgitation is trivial. Aortic Valve: The aortic valve is grossly normal. There is mild calcification of the aortic valve. There is mild thickening of the aortic valve. Aortic valve regurgitation is not visualized. Mild aortic valve sclerosis is present, with no evidence of aortic valve stenosis. Aortic valve mean gradient measures 7.0 mmHg. Aortic valve peak gradient measures 15.2 mmHg. Aortic valve area, by VTI measures 1.76 cm. Pulmonic Valve: The pulmonic valve was not well visualized. Pulmonic valve regurgitation is not visualized. Aorta: The aortic root, ascending aorta and aortic arch are all structurally normal, with no evidence of dilitation or obstruction. Venous: The inferior vena cava is normal in size with greater than 50% respiratory variability, suggesting right atrial pressure of 3 mmHg. IAS/Shunts: The atrial septum is grossly normal.  LEFT VENTRICLE PLAX 2D LVIDd:         5.10 cm     Diastology LVIDs:         3.00 cm     LV e' medial:    4.19 cm/s LV PW:         0.80 cm     LV E/e' medial:  28.2 LV IVS:        0.70 cm     LV e' lateral:   7.90 cm/s LVOT  diam:     1.90 cm     LV E/e' lateral: 14.9 LV SV:         75 LV SV Index:   44 LVOT Area:     2.84 cm  LV Volumes (MOD) LV vol d, MOD A2C: 50.5 ml LV vol d, MOD A4C: 62.3 ml LV vol s, MOD A2C: 30.7 ml LV vol s, MOD A4C: 35.6 ml LV SV MOD A2C:     19.8 ml LV SV MOD A4C:     62.3 ml LV SV MOD BP:      21.9 ml RIGHT VENTRICLE RV S prime:     8.31 cm/s TAPSE (M-mode): 1.7 cm LEFT ATRIUM             Index       RIGHT ATRIUM           Index LA Vol (A2C):   96.5 ml 56.70 ml/m RA Area:     13.40 cm LA Vol (A4C):   86.6 ml 50.88 ml/m RA Volume:   30.80 ml  18.10 ml/m LA Biplane Vol: 94.1 ml 55.29 ml/m  AORTIC VALVE                    PULMONIC VALVE AV Area (Vmax):    1.79 cm     PV Vmax:       0.71 m/s AV Area (Vmean):   1.86 cm     PV Peak grad:  2.0 mmHg AV Area (VTI):     1.76 cm AV Vmax:           195.00 cm/s AV Vmean:          125.000 cm/s AV VTI:  0.428 m AV Peak Grad:      15.2 mmHg AV Mean Grad:      7.0 mmHg LVOT Vmax:         123.00 cm/s LVOT Vmean:        81.900 cm/s LVOT VTI:          0.265 m LVOT/AV VTI ratio: 0.62  AORTA Ao Asc diam: 3.00 cm MITRAL VALVE                 TRICUSPID VALVE MV Area (PHT): 3.42 cm      TR Peak grad:   38.9 mmHg MV Decel Time: 222 msec      TR Vmax:        312.00 cm/s MR Peak grad:    88.7 mmHg MR Vmax:         471.00 cm/s SHUNTS MR PISA:         1.57 cm    Systemic VTI:  0.26 m MR PISA Eff ROA: 8 mm       Systemic Diam: 1.90 cm MR PISA Radius:  0.50 cm MV E velocity: 118.00 cm/s MV A velocity: 94.10 cm/s MV E/A ratio:  1.25 Buford Dresser MD Electronically signed by Buford Dresser MD Signature Date/Time: 07/23/2020/8:13:32 PM    Final     Cardiac Studies   1. Left ventricular ejection fraction, by estimation, is 50 to 55%. The  left ventricle has low normal function. The left ventricle demonstrates  regional wall motion abnormalities (see scoring diagram/findings for  description). Left ventricular diastolic   parameters are consistent  with Grade II diastolic dysfunction  (pseudonormalization). Elevated left ventricular end-diastolic pressure.  There is mild hypokinesis of the left ventricular, mid-apical inferior  wall, inferoseptal wall and anteroseptal wall.   2. Right ventricular systolic function is normal. The right ventricular  size is normal. There is mildly elevated pulmonary artery systolic  pressure.   3. Left atrial size was severely dilated.   4. Right atrial size was mild to moderately dilated.   5. The mitral valve is degenerative. Mild to moderate mitral valve  regurgitation. Moderate mitral annular calcification.   6. The aortic valve is grossly normal. There is mild calcification of the  aortic valve. There is mild thickening of the aortic valve. Aortic valve  regurgitation is not visualized. Mild aortic valve sclerosis is present,  with no evidence of aortic valve  stenosis.   7. The inferior vena cava is normal in size with greater than 50%  respiratory variability, suggesting right atrial pressure of 3 mmHg  Patient Profile     87 y.o. 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; HIT and prior CAD s/p CABG with NSTEMI and Flash Pulmonary Edema  Assessment & Plan    NSTEMI HIT HTN Moderate to Severe MR and hypokalemia with diuresis - will plan for DAPT for one year - has completed  - continue atorvastatin 40 mg PO daily - given prior BB intolerance will not re-challenge - continue home ACEi - given age and other comorbidity; conservative therapy is planned - transitioned to PO lasix today - Discussed care with niece [name]; per her request and now that she has teeth will change diet to full  Discussed with patient, niece, nursing and primary MD   For questions or updates, please contact [facility] Please consult [email or web address] for contact info under Cardiology/STEMI.      Signed, Werner Lean, MD  07/25/2020, 9:28 AM

## 2020-07-25 NOTE — Progress Notes (Signed)
ANTICOAGULATION CONSULT NOTE - Initial Consult  Pharmacy Consult for argatroban Indication: NSTEMI  Labs: Recent Labs    07/23/20 0500 07/23/20 0629 07/23/20 1022 07/23/20 1220 07/23/20 1838 07/24/20 0058 07/24/20 0658 07/24/20 1303 07/25/20 0008 07/25/20 0819 07/25/20 0820  HGB 14.1  --   --   --   --  13.2  --   --  13.5  --   --   HCT 45.0  --   --   --   --  39.0  --   --  40.8  --   --   PLT 140*  --   --   --   --  129*  --   --  142*  --   --   APTT  --   --   --   --    < > 44*   < > 31 52* 54*  --   CREATININE 0.95  --   --   --   --  0.88  --   --   --   --  1.53*  TROPONINIHS 221* 895* 2,058* 1,762*  --   --   --   --   --   --   --    < > = values in this interval not displayed.    Assessment/Plan:  87yo female with most recent aPTT therapeutic. CBC stable, no overt bleeding noted.   Argatroban discontinued by cardiology provider s/p 48 hours. Pharmacy consult removed.   Norina Buzzard, PharmD PGY1 Pharmacy Resident 07/25/2020 9:39 AM

## 2020-07-25 NOTE — TOC Initial Note (Signed)
Transition of Care [facility]) - Initial/Assessment Note    Patient Details  Name: Makayla Fisher MRN: [id number] Date of Birth: Jun 17, 1922  [facility] CM/SW Contact:    Marilu Favre, RN Phone Number: 07/25/2020, 2:48 PM  Clinical Narrative:                  Spoke to patient and niece [name] at bedside. Provided Medicare.gov list of home health agencies.   Patient from [facility] [phone]. NCM called spoke to [place]. Their preferred provider for home health is [facility]. They do not accept patient's back on weekends only on week days. Will need updated PT note. Last PT note 07/23/20.   NCM paged PT spoke with Jenny Reichmann , she will see if someone from PT can see today.   Patient and [name] aware of all of above and voiced understanding.   Secure chatted nurse and MD.  Expected Discharge Plan: Assisted Living (with home health)     Patient Goals and CMS Choice Patient states their goals for this hospitalization and ongoing recovery are:: to return to [facility] CMS Medicare.gov Compare Post Acute Care list provided to:: Patient Choice offered to / list presented to : Patient (niece [name])  Expected Discharge Plan and Services Expected Discharge Plan: Assisted Living (with home health)     Post Acute Care Choice: [place] arrangements for the past 2 months: Apartment                   DME Agency: NA                  Prior Living Arrangements/Services Living arrangements for the past 2 months: Apartment Lives with:: Self Patient language and need for interpreter reviewed:: Yes            Current home services: DME Criminal Activity/Legal Involvement Pertinent to Current Situation/Hospitalization: No - Comment as needed  Activities of Daily Living      Permission Sought/Granted   Permission granted to share information with : Yes, Verbal Permission Granted  Share Information with NAME: [name] niece            Emotional Assessment   Attitude/Demeanor/Rapport: Engaged Affect (typically observed): Accepting Orientation: : Oriented to Self,Oriented to Place,Oriented to  Time,Oriented to Situation Alcohol / Substance Use: Not Applicable Psych Involvement: No (comment)  Admission diagnosis:  Acute congestive heart failure (HCC) [I50.9] Acute respiratory failure with hypoxia (HCC) [J96.01] Acute congestive heart failure, unspecified heart failure type [facility]) [I50.9] Patient Active Problem List   Diagnosis Date Noted  . Acute congestive heart failure ([place]) 07/23/2020  . Heparin allergy 07/23/2020  . Obesity 11/28/2019  . Hx of migraines 11/28/2019  . GERD (gastroesophageal reflux disease) 11/28/2019  . Stroke-like symptoms s/p IV tPA 11/28/2019  . 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 TIA (transient ischemic attack) 04/30/2017  . Dyslipidemia 04/30/2017  . Hx of CABG x 3/stent 04/30/2017  . Bilateral carotid artery stenosis 04/30/2017  . Non Hodgkin's lymphoma ([place]) 04/30/2017  . Hypothyroidism 04/30/2017  . Hypertension 04/30/2017  . S/P colostomy ([place]) 04/30/2017  . Anxiety disorder 04/30/2017  . Abnormal urinalysis 04/30/2017  . CAP (community acquired pneumonia) 12/13/2016  . Acute lower UTI 12/13/2016  . Glaucoma 12/13/2016  . Colostomy in place [facility]) 12/13/2016  . Vitamin D deficiency 10/20/2016  . Follicular lymphoma grade IIIa of intra-abdominal lymph nodes ([place]) 04/22/2016  . Iron deficiency anemia due to chronic blood  loss 08/07/2014  . HTN (hypertension) 07/15/2014  . Unilateral carotid artery disease ([place]) 07/15/2014  . Bradycardia 07/15/2014  . 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 Preop cardiovascular exam 04/08/2014  . NHL (non-Hodgkin's lymphoma) ([place]) 10/18/2013  . 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

## 2020-07-25 NOTE — Progress Notes (Signed)
ANTICOAGULATION CONSULT NOTE - Initial Consult  Pharmacy Consult for argatroban Indication: NSTEMI  Labs: Recent Labs    07/23/20 0500 07/23/20 0629 07/23/20 1022 07/23/20 1220 07/23/20 1838 07/24/20 0058 07/24/20 0658 07/24/20 1303 07/25/20 0008  HGB 14.1  --   --   --   --  13.2  --   --  13.5  HCT 45.0  --   --   --   --  39.0  --   --  40.8  PLT 140*  --   --   --   --  129*  --   --  142*  APTT  --   --   --   --    < > 44* 49* 31 52*  CREATININE 0.95  --   --   --   --  0.88  --   --   --   TROPONINIHS 221* 895* 2,058* 1,762*  --   --   --   --   --    < > = values in this interval not displayed.    Assessment/Plan:  87yo female therapeutic on argatroban after rate change. Will continue gtt at current rate of 1.8 mcg/kg/min and confirm stable with additional PTT.   Wynona Neat, PharmD, BCPS  07/25/2020,1:01 AM

## 2020-07-25 NOTE — Progress Notes (Signed)
PROGRESS NOTE    Makayla Fisher  XQJ:[id number] DOB: 1923-02-12 DOA: 07/23/2020 PCP: [name], DO  Outpatient Specialists:   Brief Narrative:  As per H&P done by Dr. Lorin Mercy: "Makayla Fisher is a 87 y.o. female with medical history significant of NHL; CAD; and GERD presenting with SOB.  No cognitive impairment, very independent at baseline.  Hey called this AM - BP up, anxious, SOB.  She does tend to get very anxious.  She had SOB yesterday before bed but it acutely worsened overnight.  She went to bed early not feeling well, had a headache.  She feels better now, improved SOB.  She feels tired, but didn't sleep at all.  She has not been coughing.  Her BP is usually fairly well controlled.  She did not have chest pain at all.   ED Course:  Carryover, per Dr. Alcario Drought:  87 yo F with no h/o pulmonary pathology. In with SOB for past few days. EMS noted BP 212/120. Been compliant with meds at [facility]. Suspicion of flash pulm edema though CXR being read as infectious vs inflamatory. BNP elevated. On 2L [place]. Got home meds now 178/90. 20 lasix ordered"  07/25/2020: Patient seen alongside patient's niece.  Shortness of breath has improved significantly.  No chest pain.  The plan is to discharge patient to an assisted living facility.  Apparently, the assisted living facility will not accept patient over the weekend.   Assessment & Plan:   Principal Problem:   Acute congestive heart failure (HCC) Active Problems:   HTN (hypertension)   Dyslipidemia   Hypothyroidism   Heparin allergy   Acute CHF, likely hypertensive crisis -Patient with known h/o chronic diastolic CHF (echo in 07/4816 with grade 2 diastolic dysfunction) presenting with worsening SOB and marked HTN -CXR consistent with pulmonary edema (read as infiltrates) -Elevated BNP compared to prior -Echocardiogram revealed normal left ventricular ejection fraction, diastolic dysfunction, hypokinesis of the left ventricle,  severely dilated left atrium, mild to moderately dilated right atrium, and aortic valve sclerosis.  -Aspirin and Plavix are continued. -Transition to oral diuretics.   -Cardiology input is appreciated.  Elevated troponin with significantly increased delta; likely related to demand ischemia given the circumstances and lack of chest pain - will continue to trend -Continue [place] O2 as needed.    HTN -Continue Lisinopril -Continue to optimize.  HLD -Continue Lipitor  Hypothyroidism -TSH was 3.658.   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     DVT prophylaxis: Argatroban. Code Status: DO NOT RESUSCITATE Family Communication:  Disposition Plan: Likely discharge back to assisted living facility.   Consultants:   Cardiology  Procedures:   None  Antimicrobials:   None   Subjective: No shortness of breath.    Objective: Vitals:   07/24/20 1134 07/24/20 1935 07/25/20 0503 07/25/20 0913  BP: (!) 114/39 (!) 138/50 (!) 120/12 104/75  Pulse: (!) 58 75 78 76  Resp: 16 18 18 16   Temp: 98 F (36.7 C) 98.2 F (36.8 C) 98.4 F (36.9 C) 97.8 F (36.6 C)  TempSrc:  Oral Oral Oral  SpO2: 94% 92% 94% 91%  Weight:   70.5 kg     Intake/Output Summary (Last 24 hours) at 07/25/2020 1825 Last data filed at 07/25/2020 0604 Gross per 24 hour  Intake 410.57 ml  Output --  Net 410.57 ml   Filed Weights   07/24/20 0002 07/24/20 0500 07/25/20 0503  Weight: 72.1  kg 72.1 kg 70.5 kg    Examination:  General exam: Appears calm and comfortable.  Patient is obese.  Patient is not in any significant distress. Respiratory system: Decreased air entry. Cardiovascular system: S1 & S2 with systolic murmur.  Gastrointestinal system: Abdomen is obese, soft and nontender.  Organs are difficult to assess. Central nervous system: Alert and oriented.  Patient moves all extremities. Extremities: No leg edema.  Data  Reviewed: I have personally reviewed following labs and imaging studies  CBC: Recent Labs  Lab 07/23/20 0500 07/24/20 0058 07/25/20 0008  WBC 10.1 8.7 9.0  NEUTROABS 7.1  --   --   HGB 14.1 13.2 13.5  HCT 45.0 39.0 40.8  MCV 100.9* 95.6 96.9  PLT 140* 129* 983*   Basic Metabolic Panel: Recent Labs  Lab 07/23/20 0500 07/24/20 0058 07/25/20 0820  NA 140 136 138  K 3.9 3.1* 4.4  CL 104 98 101  CO2 25 29 28   GLUCOSE 140* 99 101*  BUN 14 14 33*  CREATININE 0.95 0.88 1.53*  CALCIUM 10.0 9.1 9.2   GFR: Estimated Creatinine Clearance: 19.3 mL/min (A) (by C-G formula based on SCr of 1.53 mg/dL (H)). Liver Function Tests: Recent Labs  Lab 07/23/20 0500  AST 22  ALT 12  ALKPHOS 57  BILITOT 1.0  PROT 6.1*  ALBUMIN 3.7   No results for input(s): LIPASE, AMYLASE in the last 168 hours. No results for input(s): AMMONIA in the last 168 hours. Coagulation Profile: No results for input(s): INR, PROTIME in the last 168 hours. Cardiac Enzymes: No results for input(s): CKTOTAL, CKMB, CKMBINDEX, TROPONINI in the last 168 hours. BNP (last 3 results) No results for input(s): PROBNP in the last 8760 hours. HbA1C: No results for input(s): HGBA1C in the last 72 hours. CBG: No results for input(s): GLUCAP in the last 168 hours. Lipid Profile: No results for input(s): CHOL, HDL, LDLCALC, TRIG, CHOLHDL, LDLDIRECT in the last 72 hours. Thyroid Function Tests: Recent Labs    07/23/20 1022  TSH 3.658   Anemia Panel: No results for input(s): VITAMINB12, FOLATE, FERRITIN, TIBC, IRON, RETICCTPCT in the last 72 hours. Urine analysis:    Component Value Date/Time   COLORURINE STRAW (A) 11/27/2019 0421   APPEARANCEUR CLEAR 11/27/2019 0421   LABSPEC 1.013 11/27/2019 0421   PHURINE 7.0 11/27/2019 0421   GLUCOSEU NEGATIVE 11/27/2019 0421   HGBUR NEGATIVE 11/27/2019 0421   BILIRUBINUR NEGATIVE 11/27/2019 0421   KETONESUR NEGATIVE 11/27/2019 0421   PROTEINUR 30 (A) 11/27/2019 0421    UROBILINOGEN 0.2 08/19/2013 1624   NITRITE NEGATIVE 11/27/2019 0421   LEUKOCYTESUR SMALL (A) 11/27/2019 0421   Sepsis Labs: @LABRCNTIP (procalcitonin:4,lacticidven:4)  ) Recent Results (from the past 240 hour(s))  Resp Panel by RT-PCR (Flu A&B, Covid) Nasopharyngeal Swab     Status: None   Collection Time: 07/23/20  4:28 AM   Specimen: Nasopharyngeal Swab; Nasopharyngeal(NP) swabs in vial transport medium  Result Value Ref Range Status   SARS Coronavirus 2 by RT PCR NEGATIVE NEGATIVE Final    Comment: (NOTE) SARS-CoV-2 target nucleic acids are NOT DETECTED.  The SARS-CoV-2 RNA is generally detectable in upper respiratory specimens during the acute phase of infection. The lowest concentration of SARS-CoV-2 viral copies this assay can detect is 138 copies/mL. A negative result does not preclude SARS-Cov-2 infection and should not be used as the sole basis for treatment or other patient management decisions. A negative result may occur with  improper specimen collection/handling, submission of specimen other than nasopharyngeal  swab, presence of viral mutation(s) within the areas targeted by this assay, and inadequate number of viral copies(<138 copies/mL). A negative result must be combined with clinical observations, patient history, and epidemiological information. The expected result is Negative.  Fact Sheet for Patients:  EntrepreneurPulse.com.au  Fact Sheet for Healthcare Providers:  IncredibleEmployment.be  This test is no t yet approved or cleared by the [place] FDA and  has been authorized for detection and/or diagnosis of SARS-CoV-2 by FDA under an Emergency Use Authorization (EUA). This EUA will remain  in effect (meaning this test can be used) for the duration of the COVID-19 declaration under Section 564(b)(1) of the Act, 21 U.S.C.section 360bbb-3(b)(1), unless the authorization is terminated  or revoked sooner.        Influenza A by PCR NEGATIVE NEGATIVE Final   Influenza B by PCR NEGATIVE NEGATIVE Final    Comment: (NOTE) The Xpert Xpress SARS-CoV-2/FLU/RSV plus assay is intended as an aid in the diagnosis of influenza from Nasopharyngeal swab specimens and should not be used as a sole basis for treatment. Nasal washings and aspirates are unacceptable for Xpert Xpress SARS-CoV-2/FLU/RSV testing.  Fact Sheet for Patients: EntrepreneurPulse.com.au  Fact Sheet for Healthcare Providers: IncredibleEmployment.be  This test is not yet approved or cleared by the [place] FDA and has been authorized for detection and/or diagnosis of SARS-CoV-2 by FDA under an Emergency Use Authorization (EUA). This EUA will remain in effect (meaning this test can be used) for the duration of the COVID-19 declaration under Section 564(b)(1) of the Act, 21 U.S.C. section 360bbb-3(b)(1), unless the authorization is terminated or revoked.  Performed at [facility], [place] [address], [place], [place] [address]          Radiology Studies: No results found.      Scheduled Meds: . aspirin EC  81 mg Oral Daily  . atorvastatin  40 mg Oral QHS  . clopidogrel  75 mg Oral Daily  . docusate sodium  100 mg Oral BID  . feeding supplement  237 mL Oral BID BM  . [START ON 07/26/2020] furosemide  40 mg Oral Daily  . levothyroxine  50 mcg Oral QAC breakfast  . multivitamin with minerals  1 tablet Oral Daily  . psyllium  1 packet Oral Q M,W,F  . rOPINIRole  1 mg Oral QHS  . sertraline  25 mg Oral Daily  . sodium chloride flush  10-40 mL Intracatheter Q12H  . sodium chloride flush  3 mL Intravenous Q12H  . timolol  1 drop Both Eyes BID  . umeclidinium bromide  1 puff Inhalation Daily   Continuous Infusions:    LOS: 2 days    Time spent: 25 minutes    Dana Allan, MD  [facility] Pager #: [phone] 7PM-7AM contact night coverage as above

## 2020-07-25 NOTE — Progress Notes (Signed)
Physical Therapy Treatment Patient Details Name: Makayla Fisher MRN: [id number] DOB: Jul 04, 1922 Today's Date: 07/25/2020    History of Present Illness 87 y/o female admitted secondary to increased SOB. Thought to be secondary to CHF exacerbation. PMH includes CAD s/p CABG, CVA, and s/p colostomy.    PT Comments    Pt was seen for mobility on RW with good tolerance after use of BSC, but does have a drop to 89% sat on room air.  Pt is not particularly SOB but is tired, did report to MD and nursing for follow up with her.  Continue on with PT as needed by her stay, to increase strength in LE's, increase independence with all mobility and increase standing endurance as well as tolerance for gait.  Monitor sats as needed.  Pt is able to walk with min guard which should be manageable in ALF environment.    Follow Up Recommendations  Home health PT;Supervision for mobility/OOB     Equipment Recommendations  None recommended by PT    Recommendations for Other Services       Precautions / Restrictions Precautions Precautions: Fall Precaution Comments: monitor O2 sats with gait Restrictions Weight Bearing Restrictions: No    Mobility  Bed Mobility Overal bed mobility: Needs Assistance Bed Mobility: Supine to Sit;Sit to Supine     Supine to sit: Min assist Sit to supine: Min assist   General bed mobility comments: assisted trunk off bed and legs onto bed    Transfers Overall transfer level: Needs assistance Equipment used: Rolling walker (2 wheeled);1 person hand held assist Transfers: Sit to/from Stand Sit to Stand: Min guard Stand pivot transfers: Min guard          Ambulation/Gait Ambulation/Gait assistance: Min guard Gait Distance (Feet): 50 Feet (25 x 2) Assistive device: Rolling walker (2 wheeled) Gait Pattern/deviations: Step-through pattern;Decreased stride length;Trunk flexed Gait velocity: reduced Gait velocity interpretation: <1.31 ft/sec, indicative of  household ambulator General Gait Details: after sidestepping then walked to door and back x 2   Marine scientist [place] (Stroke Patients Only)       Balance Overall balance assessment: Needs assistance Sitting-balance support: No upper extremity supported;Feet supported Sitting balance-Leahy Scale: Good     Standing balance support: Bilateral upper extremity supported;During functional activity Standing balance-Leahy Scale: Poor Standing balance comment: RW support is essential for safety                            Cognition Arousal/Alertness: Awake/alert Behavior During Therapy: WFL for tasks assessed/performed Overall Cognitive Status: Within Functional Limits for tasks assessed                                        Exercises      General Comments General comments (skin integrity, edema, etc.): Pt was assisted to stand and get to [facility] then walked x 2 with ability to sidestep and forward step      Pertinent Vitals/Pain Pain Assessment: No/denies pain    Home Living                      Prior Function            PT Goals (current goals can now be found in the care plan  section) Acute Rehab PT Goals Patient Stated Goal: to feel better Progress towards PT goals: Progressing toward goals    Frequency    Min 3X/week      PT Plan Current plan remains appropriate    Co-evaluation              AM-PAC PT "6 Clicks" Mobility   Outcome Measure  Help needed turning from your back to your side while in a flat bed without using bedrails?: A Little Help needed moving from lying on your back to sitting on the side of a flat bed without using bedrails?: A Little Help needed moving to and from a bed to a chair (including a wheelchair)?: A Little Help needed standing up from a chair using your arms (e.g., wheelchair or bedside chair)?: A Little Help needed to walk in hospital room?:  A Little Help needed climbing 3-5 steps with a railing? : A Lot 6 Click Score: 17    End of Session Equipment Utilized During Treatment: Gait belt (pulse oximeter) Activity Tolerance: Treatment limited secondary to medical complications (Comment) (O2 sats down to 89% with gait) Patient left: in bed;with call bell/phone within reach Nurse Communication: Mobility status PT Visit Diagnosis: Unsteadiness on feet (R26.81);Muscle weakness (generalized) (M62.81)     Time: 4097-3532 PT Time Calculation (min) (ACUTE ONLY): 27 min  Charges:  $Gait Training: 8-22 mins $Therapeutic Activity: 8-22 mins                Ramond Dial 07/25/2020, 5:31 PM Mee Hives, PT MS Acute Rehab Dept. Number: [place] and [place]

## 2020-07-26 ENCOUNTER — Other Ambulatory Visit: Payer: Self-pay

## 2020-07-26 DIAGNOSIS — I10 Essential (primary) hypertension: Secondary | ICD-10-CM

## 2020-07-26 DIAGNOSIS — D7582 Heparin induced thrombocytopenia (HIT): Secondary | ICD-10-CM | POA: Diagnosis not present

## 2020-07-26 DIAGNOSIS — N179 Acute kidney failure, unspecified: Secondary | ICD-10-CM | POA: Diagnosis not present

## 2020-07-26 DIAGNOSIS — I214 Non-ST elevation (NSTEMI) myocardial infarction: Secondary | ICD-10-CM | POA: Diagnosis not present

## 2020-07-26 LAB — BASIC METABOLIC PANEL
Anion gap: 8 (ref 5–15)
BUN: 36 mg/dL — ABNORMAL HIGH (ref 8–23)
CO2: 30 mmol/L (ref 22–32)
Calcium: 9.2 mg/dL (ref 8.9–10.3)
Chloride: 102 mmol/L (ref 98–111)
Creatinine, Ser: 1.36 mg/dL — ABNORMAL HIGH (ref 0.44–1.00)
GFR, Estimated: 36 mL/min — ABNORMAL LOW (ref >60)
Glucose, Bld: 104 mg/dL — ABNORMAL HIGH (ref 70–99)
Potassium: 4.5 mmol/L (ref 3.5–5.1)
Sodium: 140 mmol/L (ref 135–145)

## 2020-07-26 LAB — CBC
HCT: 39.3 % (ref 36.0–46.0)
Hemoglobin: 13.2 g/dL (ref 12.0–15.0)
MCH: 33 pg (ref 26.0–34.0)
MCHC: 33.6 g/dL (ref 30.0–36.0)
MCV: 98.3 fL (ref 80.0–100.0)
Platelets: 149 10*3/uL — ABNORMAL LOW (ref 150–400)
RBC: 4 MIL/uL (ref 3.87–5.11)
RDW: 14.5 % (ref 11.5–15.5)
WBC: 8.7 10*3/uL (ref 4.0–10.5)
nRBC: 0 % (ref 0.0–0.2)

## 2020-07-26 NOTE — Progress Notes (Signed)
PROGRESS NOTE    Makayla Fisher  SNK:[id number] DOB: March 11, 1923 DOA: 07/23/2020 PCP: [name], DO  Outpatient Specialists:   Brief Narrative:  As per H&P done by Dr. Lorin Mercy: "Makayla Fisher is a 87 y.o. female with medical history significant of NHL; CAD; and GERD presenting with SOB.  No cognitive impairment, very independent at baseline.  Hey called this AM - BP up, anxious, SOB.  She does tend to get very anxious.  She had SOB yesterday before bed but it acutely worsened overnight.  She went to bed early not feeling well, had a headache.  She feels better now, improved SOB.  She feels tired, but didn't sleep at all.  She has not been coughing.  Her BP is usually fairly well controlled.  She did not have chest pain at all.   ED Course:  Carryover, per Dr. Alcario Drought:  87 yo F with no h/o pulmonary pathology. In with SOB for past few days. EMS noted BP 212/120. Been compliant with meds at [facility]. Suspicion of flash pulm edema though CXR being read as infectious vs inflamatory. BNP elevated. On 2L [place]. Got home meds now 178/90. 20 lasix ordered"  07/25/2020: Patient seen alongside patient's niece.  Shortness of breath has improved significantly.  No chest pain.  The plan is to discharge patient to an assisted living facility.  Apparently, the assisted living facility will not accept patient over the weekend.  07/26/2020: Patient seen.  No new changes.  Patient remained stable.  For discharge by tomorrow.   Assessment & Plan:   Principal Problem:   Acute congestive heart failure (HCC) Active Problems:   HTN (hypertension)   Dyslipidemia   Hypothyroidism   Heparin allergy   Acute CHF, likely hypertensive crisis -Patient with known h/o chronic diastolic CHF (echo in 12/3788 with grade 2 diastolic dysfunction) presenting with worsening SOB and marked HTN -CXR consistent with pulmonary edema (read as infiltrates) -Elevated BNP compared to prior -Echocardiogram revealed  normal left ventricular ejection fraction, diastolic dysfunction, hypokinesis of the left ventricle, severely dilated left atrium, mild to moderately dilated right atrium, and aortic valve sclerosis.  -Aspirin and Plavix are continued. -Transition to oral diuretics.   -Cardiology input is appreciated.  Elevated troponin with significantly increased delta; likely related to demand ischemia given the circumstances and lack of chest pain - will continue to trend -Continue [place] O2 as needed.   07/26/2020: Compensated.  Discharge back to assisted living facility tomorrow.  HTN -Continue Lisinopril -Continue to optimize.  HLD -Continue Lipitor  Hypothyroidism -TSH was 3.658.   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     DVT prophylaxis: Argatroban. Code Status: DO NOT RESUSCITATE Family Communication:  Disposition Plan: Likely discharge back to assisted living facility.   Consultants:   Cardiology  Procedures:   None  Antimicrobials:   None   Subjective: No shortness of breath.    Objective: Vitals:   07/26/20 0501 07/26/20 0753 07/26/20 0911 07/26/20 1125  BP: (!) 113/53  (!) 120/47 (!) 142/45  Pulse: (!) 59  64 71  Resp: 16  20   Temp: 97.8 F (36.6 C)  (!) 97.5 F (36.4 C) 97.9 F (36.6 C)  TempSrc: Oral  Oral Oral  SpO2: 92% 93% 91% 97%  Weight: 69.9 kg       Intake/Output Summary (Last 24 hours) at 07/26/2020 1535 Last data filed at 07/26/2020 0936 Gross per 24 hour  Intake 150 ml  Output --  Net 150 ml   Filed Weights   07/24/20 0500 07/25/20 0503 07/26/20 0501  Weight: 72.1 kg 70.5 kg 69.9 kg    Examination:  General exam: Appears calm and comfortable.  Patient is obese.  Patient is not in any significant distress. Respiratory system: Decreased air entry. Cardiovascular system: S1 & S2 with systolic murmur.  Gastrointestinal system: Abdomen is obese, soft and  nontender.  Organs are difficult to assess. Central nervous system: Alert and oriented.  Patient moves all extremities. Extremities: No leg edema.  Data Reviewed: I have personally reviewed following labs and imaging studies  CBC: Recent Labs  Lab 07/23/20 0500 07/24/20 0058 07/25/20 0008 07/26/20 0450  WBC 10.1 8.7 9.0 8.7  NEUTROABS 7.1  --   --   --   HGB 14.1 13.2 13.5 13.2  HCT 45.0 39.0 40.8 39.3  MCV 100.9* 95.6 96.9 98.3  PLT 140* 129* 142* 203*   Basic Metabolic Panel: Recent Labs  Lab 07/23/20 0500 07/24/20 0058 07/25/20 0820 07/26/20 0450  NA 140 136 138 140  K 3.9 3.1* 4.4 4.5  CL 104 98 101 102  CO2 25 29 28 30   GLUCOSE 140* 99 101* 104*  BUN 14 14 33* 36*  CREATININE 0.95 0.88 1.53* 1.36*  CALCIUM 10.0 9.1 9.2 9.2   GFR: Estimated Creatinine Clearance: 21.6 mL/min (A) (by C-G formula based on SCr of 1.36 mg/dL (H)). Liver Function Tests: Recent Labs  Lab 07/23/20 0500  AST 22  ALT 12  ALKPHOS 57  BILITOT 1.0  PROT 6.1*  ALBUMIN 3.7   No results for input(s): LIPASE, AMYLASE in the last 168 hours. No results for input(s): AMMONIA in the last 168 hours. Coagulation Profile: No results for input(s): INR, PROTIME in the last 168 hours. Cardiac Enzymes: No results for input(s): CKTOTAL, CKMB, CKMBINDEX, TROPONINI in the last 168 hours. BNP (last 3 results) No results for input(s): PROBNP in the last 8760 hours. HbA1C: No results for input(s): HGBA1C in the last 72 hours. CBG: No results for input(s): GLUCAP in the last 168 hours. Lipid Profile: No results for input(s): CHOL, HDL, LDLCALC, TRIG, CHOLHDL, LDLDIRECT in the last 72 hours. Thyroid Function Tests: No results for input(s): TSH, T4TOTAL, FREET4, T3FREE, THYROIDAB in the last 72 hours. Anemia Panel: No results for input(s): VITAMINB12, FOLATE, FERRITIN, TIBC, IRON, RETICCTPCT in the last 72 hours. Urine analysis:    Component Value Date/Time   COLORURINE STRAW (A) 11/27/2019 0421    APPEARANCEUR CLEAR 11/27/2019 0421   LABSPEC 1.013 11/27/2019 0421   PHURINE 7.0 11/27/2019 0421   GLUCOSEU NEGATIVE 11/27/2019 0421   HGBUR NEGATIVE 11/27/2019 0421   BILIRUBINUR NEGATIVE 11/27/2019 0421   KETONESUR NEGATIVE 11/27/2019 0421   PROTEINUR 30 (A) 11/27/2019 0421   UROBILINOGEN 0.2 08/19/2013 1624   NITRITE NEGATIVE 11/27/2019 0421   LEUKOCYTESUR SMALL (A) 11/27/2019 0421   Sepsis Labs: @LABRCNTIP (procalcitonin:4,lacticidven:4)  ) Recent Results (from the past 240 hour(s))  Resp Panel by RT-PCR (Flu A&B, Covid) Nasopharyngeal Swab     Status: None   Collection Time: 07/23/20  4:28 AM   Specimen: Nasopharyngeal Swab; Nasopharyngeal(NP) swabs in vial transport medium  Result Value Ref Range Status   SARS Coronavirus 2 by RT PCR NEGATIVE NEGATIVE Final    Comment: (NOTE) SARS-CoV-2 target nucleic acids are NOT DETECTED.  The SARS-CoV-2 RNA is generally detectable in upper respiratory specimens during the acute phase of infection. The lowest concentration of SARS-CoV-2 viral copies  this assay can detect is 138 copies/mL. A negative result does not preclude SARS-Cov-2 infection and should not be used as the sole basis for treatment or other patient management decisions. A negative result may occur with  improper specimen collection/handling, submission of specimen other than nasopharyngeal swab, presence of viral mutation(s) within the areas targeted by this assay, and inadequate number of viral copies(<138 copies/mL). A negative result must be combined with clinical observations, patient history, and epidemiological information. The expected result is Negative.  Fact Sheet for Patients:  EntrepreneurPulse.com.au  Fact Sheet for Healthcare Providers:  IncredibleEmployment.be  This test is no t yet approved or cleared by the [place] FDA and  has been authorized for detection and/or diagnosis of SARS-CoV-2 by FDA under an  Emergency Use Authorization (EUA). This EUA will remain  in effect (meaning this test can be used) for the duration of the COVID-19 declaration under Section 564(b)(1) of the Act, 21 U.S.C.section 360bbb-3(b)(1), unless the authorization is terminated  or revoked sooner.       Influenza A by PCR NEGATIVE NEGATIVE Final   Influenza B by PCR NEGATIVE NEGATIVE Final    Comment: (NOTE) The Xpert Xpress SARS-CoV-2/FLU/RSV plus assay is intended as an aid in the diagnosis of influenza from Nasopharyngeal swab specimens and should not be used as a sole basis for treatment. Nasal washings and aspirates are unacceptable for Xpert Xpress SARS-CoV-2/FLU/RSV testing.  Fact Sheet for Patients: EntrepreneurPulse.com.au  Fact Sheet for Healthcare Providers: IncredibleEmployment.be  This test is not yet approved or cleared by the [place] FDA and has been authorized for detection and/or diagnosis of SARS-CoV-2 by FDA under an Emergency Use Authorization (EUA). This EUA will remain in effect (meaning this test can be used) for the duration of the COVID-19 declaration under Section 564(b)(1) of the Act, 21 U.S.C. section 360bbb-3(b)(1), unless the authorization is terminated or revoked.  Performed at [facility], [place] [address], [place], [place] [address]          Radiology Studies: No results found.      Scheduled Meds: . aspirin EC  81 mg Oral Daily  . atorvastatin  40 mg Oral QHS  . clopidogrel  75 mg Oral Daily  . docusate sodium  100 mg Oral BID  . feeding supplement  237 mL Oral BID BM  . levothyroxine  50 mcg Oral QAC breakfast  . multivitamin with minerals  1 tablet Oral Daily  . psyllium  1 packet Oral Q M,W,F  . rOPINIRole  1 mg Oral QHS  . sertraline  25 mg Oral Daily  . sodium chloride flush  10-40 mL Intracatheter Q12H  . sodium chloride flush  3 mL Intravenous Q12H  . timolol  1 drop Both Eyes BID  . umeclidinium  bromide  1 puff Inhalation Daily   Continuous Infusions:    LOS: 3 days    Time spent: 25 minutes    Dana Allan, MD  [facility] Pager #: [phone] 7PM-7AM contact night coverage as above

## 2020-07-26 NOTE — Progress Notes (Signed)
Progress Note  Patient Name: [name] Date of Encounter: 07/26/2020  Primary Cardiologist: Jenkins Rouge, MD   Subjective   Lasix and ACEi held overnight for AKI.  With this, Creatinine has improved and patient notes no CP or SOB.  Notes that she has been in lisinopril for ages and has had problems with HTN meds in the past (not sure which).    Inpatient Medications    Scheduled Meds:  aspirin EC  81 mg Oral Daily   atorvastatin  40 mg Oral QHS   clopidogrel  75 mg Oral Daily   docusate sodium  100 mg Oral BID   feeding supplement  237 mL Oral BID BM   furosemide  40 mg Oral Daily   levothyroxine  50 mcg Oral QAC breakfast   multivitamin with minerals  1 tablet Oral Daily   psyllium  1 packet Oral Q M,W,F   rOPINIRole  1 mg Oral QHS   sertraline  25 mg Oral Daily   sodium chloride flush  10-40 mL Intracatheter Q12H   sodium chloride flush  3 mL Intravenous Q12H   timolol  1 drop Both Eyes BID   umeclidinium bromide  1 puff Inhalation Daily   Continuous Infusions:   PRN Meds: acetaminophen **OR** acetaminophen, bisacodyl, hydrALAZINE, morphine injection, ondansetron **OR** ondansetron (ZOFRAN) IV, oxyCODONE, polyethylene glycol, psyllium, sodium chloride flush, traZODone   Vital Signs    Vitals:   07/25/20 1938 07/26/20 0501 07/26/20 0753 07/26/20 0911  BP: (!) 131/54 (!) 113/53  (!) 120/47  Pulse: 82 (!) 59  64  Resp: 18 16  20   Temp: 98.4 F (36.9 C) 97.8 F (36.6 C)  (!) 97.5 F (36.4 C)  TempSrc: Oral Oral  Oral  SpO2: 96% 92% 93% 91%  Weight:  69.9 kg      Intake/Output Summary (Last 24 hours) at 07/26/2020 0947 Last data filed at 07/26/2020 0936 Gross per 24 hour  Intake 30 ml  Output --  Net 30 ml   Filed Weights   07/24/20 0500 07/25/20 0503 07/26/20 0501  Weight: 72.1 kg 70.5 kg 69.9 kg    Telemetry    SR to sinus bradycardia nocturnal pause 1.79 s and occasion PVCs - Personally Reviewed  ECG    No new - Personally Reviewed  Physical  Exam   GEN: No acute distress.   Neck: No JVD Cardiac: RRR, II/VI systolic murmur- stable, no rubs, or gallops.  Respiratory: Clear to auscultation reasonable effort GI: Soft, nontender, non-distended  MS: No edema; No deformity. Neuro:  Nonfocal  Psych: Normal affect   Labs    Chemistry Recent Labs  Lab 07/23/20 0500 07/24/20 0058 07/25/20 0820 07/26/20 0450  NA 140 136 138 140  K 3.9 3.1* 4.4 4.5  CL 104 98 101 102  CO2 25 29 28 30   GLUCOSE 140* 99 101* 104*  BUN 14 14 33* 36*  CREATININE 0.95 0.88 1.53* 1.36*  CALCIUM 10.0 9.1 9.2 9.2  PROT 6.1*  --   --   --   ALBUMIN 3.7  --   --   --   AST 22  --   --   --   ALT 12  --   --   --   ALKPHOS 57  --   --   --   BILITOT 1.0  --   --   --   GFRNONAA 55* >60 31* 36*  ANIONGAP 11 9 9 8      Hematology  Recent Labs  Lab 07/24/20 0058 07/25/20 0008 07/26/20 0450  WBC 8.7 9.0 8.7  RBC 4.08 4.21 4.00  HGB 13.2 13.5 13.2  HCT 39.0 40.8 39.3  MCV 95.6 96.9 98.3  MCH 32.4 32.1 33.0  MCHC 33.8 33.1 33.6  RDW 14.3 14.6 14.5  PLT 129* 142* 149*    Cardiac EnzymesNo results for input(s): TROPONINI in the last 168 hours. No results for input(s): TROPIPOC in the last 168 hours.   BNP Recent Labs  Lab 07/23/20 0500  BNP 517.5*     DDimer No results for input(s): DDIMER in the last 168 hours.   Radiology    No results found.  Cardiac Studies   1. Left ventricular ejection fraction, by estimation, is 50 to 55%. The  left ventricle has low normal function. The left ventricle demonstrates  regional wall motion abnormalities (see scoring diagram/findings for  description). Left ventricular diastolic   parameters are consistent with Grade II diastolic dysfunction  (pseudonormalization). Elevated left ventricular end-diastolic pressure.  There is mild hypokinesis of the left ventricular, mid-apical inferior  wall, inferoseptal wall and anteroseptal wall.   2. Right ventricular systolic function is normal. The  right ventricular  size is normal. There is mildly elevated pulmonary artery systolic  pressure.   3. Left atrial size was severely dilated.   4. Right atrial size was mild to moderately dilated.   5. The mitral valve is degenerative. Mild to moderate mitral valve  regurgitation. Moderate mitral annular calcification.   6. The aortic valve is grossly normal. There is mild calcification of the  aortic valve. There is mild thickening of the aortic valve. Aortic valve  regurgitation is not visualized. Mild aortic valve sclerosis is present,  with no evidence of aortic valve  stenosis.   7. The inferior vena cava is normal in size with greater than 50%  respiratory variability, suggesting right atrial pressure of 3 mmHg  Patient Profile     87 y.o. 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; HIT and prior CAD s/p CABG with NSTEMI and Flash Pulmonary Edema  Assessment & Plan    NSTEMI HIT HTN Moderate to Severe MR AKI - given age and other comorbidity; conservative therapy is planned - will plan for DAPT for one year - has completed 48 hours of AC; SCDs for DVT PPX presently - continue atorvastatin 40 mg PO daily - given prior BB intolerance will not re-challenge - may plan to DC off of ACEi at discharge; and for low dose  PO lasix (possible 07/27/20 start depending on BMP) - planned discharge back to assisted living 07/27/20   Discussed at length with patient  For questions or updates, please contact [place] Please consult [email or web address] for contact info under Cardiology/STEMI.      Signed, Werner Lean, MD  07/26/2020, 9:47 AM

## 2020-07-27 ENCOUNTER — Other Ambulatory Visit (HOSPITAL_COMMUNITY): Payer: Self-pay

## 2020-07-27 ENCOUNTER — Encounter (HOSPITAL_COMMUNITY): Payer: Self-pay | Admitting: Internal Medicine

## 2020-07-27 DIAGNOSIS — I5031 Acute diastolic (congestive) heart failure: Secondary | ICD-10-CM | POA: Diagnosis not present

## 2020-07-27 DIAGNOSIS — I214 Non-ST elevation (NSTEMI) myocardial infarction: Secondary | ICD-10-CM | POA: Diagnosis not present

## 2020-07-27 DIAGNOSIS — J9601 Acute respiratory failure with hypoxia: Secondary | ICD-10-CM

## 2020-07-27 DIAGNOSIS — I509 Heart failure, unspecified: Secondary | ICD-10-CM

## 2020-07-27 DIAGNOSIS — E785 Hyperlipidemia, unspecified: Secondary | ICD-10-CM

## 2020-07-27 LAB — RESP PANEL BY RT-PCR (FLU A&B, COVID) ARPGX2
Influenza A by PCR: NEGATIVE
Influenza B by PCR: NEGATIVE
SARS Coronavirus 2 by RT PCR: NEGATIVE

## 2020-07-27 LAB — BASIC METABOLIC PANEL
Anion gap: 9 (ref 5–15)
BUN: 35 mg/dL — ABNORMAL HIGH (ref 8–23)
CO2: 29 mmol/L (ref 22–32)
Calcium: 8.8 mg/dL — ABNORMAL LOW (ref 8.9–10.3)
Chloride: 101 mmol/L (ref 98–111)
Creatinine, Ser: 1.25 mg/dL — ABNORMAL HIGH (ref 0.44–1.00)
GFR, Estimated: 40 mL/min — ABNORMAL LOW (ref >60)
Glucose, Bld: 93 mg/dL (ref 70–99)
Potassium: 4.2 mmol/L (ref 3.5–5.1)
Sodium: 139 mmol/L (ref 135–145)

## 2020-07-27 MED ORDER — FUROSEMIDE 20 MG PO TABS
20.0000 mg | ORAL_TABLET | Freq: Every day | ORAL | 3 refills | Status: DC
  Filled 2020-07-27: qty 30, 30d supply, fill #0

## 2020-07-27 MED ORDER — FUROSEMIDE 20 MG PO TABS: 20.0000 mg | ORAL_TABLET | Freq: Every day | ORAL | 3 refills | Status: DC

## 2020-07-27 MED ORDER — HEPARIN SOD (PORK) LOCK FLUSH 100 UNIT/ML IV SOLN
500.0000 [IU] | INTRAVENOUS | Status: AC | PRN
  Administered 2020-07-27: 500 [IU]
  Filled 2020-07-27: qty 5

## 2020-07-27 MED ORDER — ATORVASTATIN CALCIUM 40 MG PO TABS
40.0000 mg | ORAL_TABLET | Freq: Every day | ORAL | 30 refills | Status: DC
Start: 2020-07-27 — End: 2024-03-06
  Filled 2020-07-27: qty 30, 30d supply, fill #0

## 2020-07-27 MED ORDER — FUROSEMIDE 20 MG PO TABS
20.0000 mg | ORAL_TABLET | Freq: Every day | ORAL | Status: DC
  Administered 2020-07-27: 20 mg via ORAL
  Filled 2020-07-27: qty 1

## 2020-07-27 MED ORDER — ASPIRIN 81 MG PO TBEC
81.0000 mg | DELAYED_RELEASE_TABLET | Freq: Every day | ORAL | 11 refills | Status: DC
  Filled 2020-07-27: qty 30, 30d supply, fill #0

## 2020-07-27 MED ORDER — ATORVASTATIN CALCIUM 40 MG PO TABS: 40.0000 mg | ORAL_TABLET | Freq: Every day | ORAL | 30 refills | Status: DC

## 2020-07-27 MED ORDER — AMLODIPINE BESYLATE 5 MG PO TABS
5.0000 mg | ORAL_TABLET | Freq: Every day | ORAL | Status: DC
  Administered 2020-07-27: 5 mg via ORAL
  Filled 2020-07-27: qty 1

## 2020-07-27 MED ORDER — AMLODIPINE BESYLATE 5 MG PO TABS
5.0000 mg | ORAL_TABLET | Freq: Every day | ORAL | 2 refills | Status: DC
  Filled 2020-07-27: qty 30, 30d supply, fill #0

## 2020-07-27 MED ORDER — ASPIRIN 81 MG PO TBEC: 81.0000 mg | DELAYED_RELEASE_TABLET | Freq: Every day | ORAL | 11 refills | Status: DC

## 2020-07-27 MED ORDER — AMLODIPINE BESYLATE 5 MG PO TABS: 5.0000 mg | ORAL_TABLET | Freq: Every day | ORAL | 2 refills | Status: DC

## 2020-07-27 NOTE — Care Management Important Message (Signed)
Important Message  Patient Details  Name: Makayla Fisher MRN: [id number] Date of Birth: March 01, 1923   Medicare Important Message Given:  Yes     Shelda Altes 07/27/2020, 9:00 AM

## 2020-07-27 NOTE — NC FL2 (Signed)
Startup LEVEL OF CARE SCREENING TOOL     IDENTIFICATION  Patient Name: Makayla Fisher Birthdate: December 18, 1922 Sex: female Admission Date (Current Location): 07/23/2020  [facility] and [place] Number:  Herbalist and Address:  [facility] [place], [place] [address], [place], [place] [address]      Provider Number: [id number]  Attending Physician Name and Address:  Mendel Corning, MD  Relative Name and Phone Number:  [name] (Niece)   [phone]    Current Level of Care: Hospital Recommended Level of Care: [place] Prior Approval Number:    Date Approved/Denied:   PASRR Number:    Discharge Plan: Other (Comment) (ALF)    Current Diagnoses: Patient Active Problem List   Diagnosis Date Noted  . Acute congestive heart failure ([place]) 07/23/2020  . Heparin allergy 07/23/2020  . Obesity 11/28/2019  . Hx of migraines 11/28/2019  . GERD (gastroesophageal reflux disease) 11/28/2019  . Stroke-like symptoms s/p IV tPA 11/28/2019  . Stroke (cerebrum) ([place]) 11/27/2019  . TIA (transient ischemic attack) 04/30/2017  . Dyslipidemia 04/30/2017  . Hx of CABG x 3/stent 04/30/2017  . Bilateral carotid artery stenosis 04/30/2017  . Non Hodgkin's lymphoma ([place]) 04/30/2017  . Hypothyroidism 04/30/2017  . Hypertension 04/30/2017  . S/P colostomy ([place]) 04/30/2017  . Anxiety disorder 04/30/2017  . Abnormal urinalysis 04/30/2017  . CAP (community acquired pneumonia) 12/13/2016  . Acute lower UTI 12/13/2016  . Glaucoma 12/13/2016  . Colostomy in place [facility]) 12/13/2016  . Vitamin D deficiency 10/20/2016  . Follicular lymphoma grade IIIa of intra-abdominal lymph nodes ([place]) 04/22/2016  . Iron deficiency anemia due to chronic blood loss 08/07/2014  . HTN (hypertension) 07/15/2014  . Unilateral carotid artery disease ([place]) 07/15/2014  . Bradycardia 07/15/2014  . 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 Preop cardiovascular exam 04/08/2014  . NHL (non-Hodgkin's lymphoma) ([place]) 10/18/2013  . 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/2013    Orientation RESPIRATION BLADDER Height & Weight     Self,Time,Situation,Place  O2 (2L Nasal Cannula) External catheter Weight: 161 lb 2.5 oz (73.1 kg) Height:     BEHAVIORAL SYMPTOMS/MOOD NEUROLOGICAL BOWEL NUTRITION STATUS      Continent Diet (See DC Summary)  AMBULATORY STATUS COMMUNICATION OF NEEDS Skin   Extensive Assist Verbally Skin abrasions (Non-pressure wound abdomin/left)                       Personal Care Assistance Level of Assistance  Bathing,Feeding,Dressing Bathing Assistance: Maximum assistance Feeding assistance: Independent Dressing Assistance: Maximum assistance     Functional Limitations Info  Sight,Hearing,Speech Sight Info: Adequate Hearing Info: Impaired Speech Info: Adequate    SPECIAL CARE FACTORS FREQUENCY  PT (By licensed PT),OT (By licensed OT)     PT Frequency: 5x a week OT Frequency: 5x a week            Contractures Contractures Info: Not present    Additional Factors Info  Code Status,Allergies Code Status Info: DNR Allergies Info: Epipen (Epinephrine Hcl (Nasal))   Levaquin (Levofloxacin In D5w)   Beta Adrenergic Blockers   Chlorhexidine Gluconate   Heparin   Sulfa Antibiotics           Current Medications (07/27/2020):  This is the current hospital active medication list Current Facility-Administered Medications  Medication Dose Route Frequency Provider Last Rate Last Admin  . acetaminophen (TYLENOL) tablet 650 mg  650 mg Oral Q6H PRN Karmen Bongo, MD  650 mg at 07/27/20 0011   Or  . acetaminophen (TYLENOL) suppository 650 mg  650 mg Rectal Q6H PRN Karmen Bongo, MD      . amLODipine (NORVASC) tablet 5 mg  5 mg Oral Daily O'Neal, Cassie Freer, MD   5 mg at 07/27/20 1021  . aspirin EC tablet 81 mg  81 mg Oral Daily Chandrasekhar, Mahesh A, MD   81 mg at 07/27/20 1022  . atorvastatin  (LIPITOR) tablet 40 mg  40 mg Oral QHS Chandrasekhar, Mahesh A, MD   40 mg at 07/26/20 2108  . bisacodyl (DULCOLAX) EC tablet 5 mg  5 mg Oral Daily PRN Karmen Bongo, MD      . clopidogrel (PLAVIX) tablet 75 mg  75 mg Oral Daily Karmen Bongo, MD   75 mg at 07/27/20 1023  . docusate sodium (COLACE) capsule 100 mg  100 mg Oral BID Karmen Bongo, MD   100 mg at 07/27/20 1023  . feeding supplement (ENSURE ENLIVE / ENSURE PLUS) liquid 237 mL  237 mL Oral BID BM Dana Allan I, MD   237 mL at 07/26/20 1448  . furosemide (LASIX) tablet 20 mg  20 mg Oral Daily O'Neal, Cassie Freer, MD   20 mg at 07/27/20 1023  . hydrALAZINE (APRESOLINE) injection 5 mg  5 mg Intravenous Q4H PRN Karmen Bongo, MD      . levothyroxine (SYNTHROID) tablet 50 mcg  50 mcg Oral QAC breakfast Karmen Bongo, MD   50 mcg at 07/27/20 0612  . morphine 2 MG/ML injection 2 mg  2 mg Intravenous Q2H PRN Karmen Bongo, MD      . multivitamin with minerals tablet 1 tablet  1 tablet Oral Daily Karmen Bongo, MD   1 tablet at 07/27/20 1024  . ondansetron (ZOFRAN) tablet 4 mg  4 mg Oral Q6H PRN Karmen Bongo, MD       Or  . ondansetron [facility]) injection 4 mg  4 mg Intravenous Q6H PRN Karmen Bongo, MD      . oxyCODONE (Oxy IR/ROXICODONE) immediate release tablet 5 mg  5 mg Oral Q4H PRN Karmen Bongo, MD      . polyethylene glycol (MIRALAX / GLYCOLAX) packet 17 g  17 g Oral Daily PRN Karmen Bongo, MD      . psyllium (HYDROCIL/METAMUCIL) 1 packet  1 packet Oral Q M,W,F Karmen Bongo, MD   1 packet at 07/27/20 1025  . psyllium (HYDROCIL/METAMUCIL) 1 packet  1 packet Oral Daily PRN Karmen Bongo, MD      . rOPINIRole (REQUIP) tablet 1 mg  1 mg Oral QHS Karmen Bongo, MD   1 mg at 07/26/20 2108  . sertraline (ZOLOFT) tablet 25 mg  25 mg Oral Daily Karmen Bongo, MD   25 mg at 07/27/20 1023  . sodium chloride flush (NS) 0.9 % injection 10-40 mL  10-40 mL Intracatheter Q12H Karmen Bongo, MD   10 mL at  07/27/20 1039  . sodium chloride flush (NS) 0.9 % injection 10-40 mL  10-40 mL Intracatheter PRN Karmen Bongo, MD   10 mL at 07/27/20 1108  . sodium chloride flush (NS) 0.9 % injection 3 mL  3 mL Intravenous Q12H Karmen Bongo, MD   3 mL at 07/26/20 2108  . timolol (TIMOPTIC) 0.5 % ophthalmic solution 1 drop  1 drop Both Eyes BID Karmen Bongo, MD   1 drop at 07/27/20 1039  . traZODone (DESYREL) tablet 25 mg  25 mg Oral QHS PRN Karmen Bongo, MD  25 mg at 07/26/20 2114  . umeclidinium bromide (INCRUSE ELLIPTA) 62.5 MCG/INH 1 puff  1 puff Inhalation Daily Karmen Bongo, MD   1 puff at 07/27/20 5859     Discharge Medications: Please see discharge summary for a list of discharge medications.  Relevant Imaging Results:  Relevant Lab Results:   Additional Information SSN# [id number]  Reece Agar, [place]

## 2020-07-27 NOTE — Discharge Summary (Signed)
Physician Discharge Summary   Patient ID: Makayla Fisher MRN: [id number] DOB/AGE: Apr 07, 1923 87 y.o.  Admit date: 07/23/2020 Discharge date: 07/27/2020  Primary Care Physician:  [name], DO   Recommendations for Outpatient Follow-up:  1. Follow up with PCP in 1-2 weeks 2. Continue aspirin and Plavix for 1 year 3. Started on Norvasc 5 mg daily, Lasix 20 mg daily  Home Health: Home health PT Equipment/Devices:   Discharge Condition: stable  CODE STATUS DNR   Diet recommendation: Heart healthy diet   Discharge Diagnoses:      NSTEMI  Hypertensive urgency  Acute flash pulmonary edema . Hypothyroidism . HTN (hypertension) . Dyslipidemia   Consults: Cardiology    Allergies:   Allergies  Allergen Reactions  . Epipen [Epinephrine Hcl (Nasal)] Palpitations  . Levaquin [Levofloxacin In D5w] Other (See Comments)    Weakness- "Allergic," per MAR  . Beta Adrenergic Blockers Other (See Comments)    intolerant to higher doses of BB due to bradycardia.  . Chlorhexidine Gluconate Other (See Comments)    Pt declined use and suspects she may have an intolerance  . Heparin Other (See Comments)    "Alleregic," pre MAR- Maybe affected platelets, per patient  . Sulfa Antibiotics Rash     DISCHARGE MEDICATIONS: Allergies as of 07/27/2020      Reactions   Epipen [epinephrine Hcl (nasal)] Palpitations   Levaquin [levofloxacin In D5w] Other (See Comments)   Weakness- "Allergic," per MAR   Beta Adrenergic Blockers Other (See Comments)   intolerant to higher doses of BB due to bradycardia.   Chlorhexidine Gluconate Other (See Comments)   Pt declined use and suspects she may have an intolerance   Heparin Other (See Comments)   "Alleregic," pre MAR- Maybe affected platelets, per patient   Sulfa Antibiotics Rash      Medication List    STOP taking these medications   lisinopril 20 MG tablet Commonly known as: ZESTRIL     TAKE these medications   amLODipine 5 MG  tablet Commonly known as: NORVASC Take 1 tablet (5 mg total) by mouth daily. Start taking on: July 28, 2020   aspirin 81 MG EC tablet Take 1 tablet (81 mg total) by mouth daily. Swallow whole. Start taking on: July 28, 2020   atorvastatin 40 MG tablet Commonly known as: LIPITOR Take 1 tablet (40 mg total) by mouth at bedtime. What changed:   medication strength  how much to take  when to take this   CALCIUM 600+D3 PO Take 1 tablet by mouth at bedtime.   clopidogrel 75 MG tablet Commonly known as: PLAVIX Take 1 tablet (75 mg total) by mouth daily.   Cranberry 425 MG Caps Take 425 mg by mouth daily.   DAILY VITE PO Take 1 tablet by mouth See admin instructions. Daily at 12 noon   furosemide 20 MG tablet Commonly known as: LASIX Take 1 tablet (20 mg total) by mouth daily. Start taking on: July 28, 2020   levothyroxine 50 MCG tablet Commonly known as: SYNTHROID Take 50 mcg by mouth daily before breakfast.   lidocaine-prilocaine cream Commonly known as: EMLA Apply 1 application topically See admin instructions. Apply to port-a-cath one hour prior to procedure   nystatin cream Commonly known as: MYCOSTATIN Apply 1 application topically every 12 (twelve) hours as needed (to rash between the legs and groin).   PRESERVISION AREDS PO Take 1 tablet by mouth in the morning and at bedtime.   psyllium 58.6 % packet Commonly known  as: METAMUCIL Take 1 packet by mouth daily as needed (constipation).   psyllium 58.6 % packet Commonly known as: METAMUCIL Take 1 packet by mouth every Monday, Wednesday, and Friday. ORANGE   rOPINIRole 1 MG tablet Commonly known as: REQUIP Take 1 mg by mouth at bedtime.   sertraline 25 MG tablet Commonly known as: ZOLOFT Take 25 mg by mouth daily.   Spiriva HandiHaler 18 MCG inhalation capsule Generic drug: tiotropium Place 18 mcg into inhaler and inhale daily.   timolol 0.5 % ophthalmic solution Commonly known as: TIMOPTIC Place  1 drop into both eyes 2 (two) times daily. Affected eye(s)   Vitamin D3 Super Strength 50 MCG (2000 UT) Caps Generic drug: Cholecalciferol Take 4,000 Units by mouth daily.            Discharge Care Instructions  (From admission, onward)         Start     Ordered   07/27/20 0000  If the dressing is still on your incision site when you go home, remove it on the third day after your surgery date. Remove dressing if it begins to fall off, or if it is dirty or damaged before the third day.        07/27/20 1030           Brief H and P: For complete details please refer to admission H and P, but in brief, patient is a 87 year old female with history of NHL, CAD, GERD presented with shortness of breath.  Very independent at baseline.  Presented with shortness of breath, acutely worsened overnight prior to admission.  No chest pain. BP 212/120 by EMS.  Suspected to have flash pulmonary edema, elevated BNP, NSTEMI. She was placed on 2 L O2 via [place], Lasix ordered. Cardiology was consulted and patient was admitted for further work-up.  Hospital Course:  NSTEMI with acute flash pulmonary edema, hypertensive urgency. -Cardiology was consulted, patient had elevated troponin, elevated BNP.  Troponins trended up to 2058 -2D echo showed EF of 50 to 46%, grade 2 diastolic dysfunction, mild hypokinesis of the left ventricular, mid apical inferior wall, inferoseptal wall and anteroseptal wall -Per cardiology, medical management, continue aspirin, Plavix for 1 year, continue Lipitor. Bradycardia noted, withhold beta-blocker, currently stable for discharge.    Acute flash pulmonary edema with history of chronic diastolic CHF, history of CAD status post CABG -Currently improved, transition to oral Lasix 20 mg daily -Home O2 evaluation prior to discharge. -Per cardiology, cleared for discharge.  Hypertensive urgency -Systolic BP noted 659D by EMS, currently improved, continue Norvasc, Lasix -ACE  inhibitor held per cardiology recommendation   HLD -Continue Lipitor  Hypothyroidism -TSH was 3.658.   -Continue Synthroid  Prior history of heparin allergy, HIT Currently stable  Day of Discharge S: No acute complaints, feels close to her baseline.  No acute chest pain or shortness of breath  BP (!) 131/52 (BP Location: Right Arm)   Pulse (!) 43   Temp 98.1 F (36.7 C) (Oral)   Resp 16   Wt 73.1 kg   SpO2 94%   BMI 31.47 kg/m   Physical Exam: General: Alert and awake oriented x3 not in any acute distress. CVS: S1-S2 clear no murmur rubs or gallops Chest: clear to auscultation bilaterally, no wheezing rales or rhonchi Abdomen: soft nontender, nondistended, normal bowel sounds Extremities: no cyanosis, clubbing or edema noted bilaterally Neuro: Cranial nerves II-XII intact, no focal neurological deficits    Get Medicines reviewed and adjusted: Please take  all your medications with you for your next visit with your Primary MD  Please request your Primary MD to go over all hospital tests and procedure/radiological results at the follow up. Please ask your Primary MD to get all Hospital records sent to his/her office.  If you experience worsening of your admission symptoms, develop shortness of breath, life threatening emergency, suicidal or homicidal thoughts you must seek medical attention immediately by calling 911 or calling your MD immediately  if symptoms less severe.  You must read complete instructions/literature along with all the possible adverse reactions/side effects for all the Medicines you take and that have been prescribed to you. Take any new Medicines after you have completely understood and accept all the possible adverse reactions/side effects.   Do not drive when taking pain medications.   Do not take more than prescribed Pain, Sleep and Anxiety Medications  Special Instructions: If you have smoked or chewed Tobacco  in the last 2 yrs please stop  smoking, stop any regular Alcohol  and or any Recreational drug use.  Wear Seat belts while driving.  Please note  You were cared for by a hospitalist during your hospital stay. Once you are discharged, your primary care physician will handle any further medical issues. Please note that NO REFILLS for any discharge medications will be authorized once you are discharged, as it is imperative that you return to your primary care physician (or establish a relationship with a primary care physician if you do not have one) for your aftercare needs so that they can reassess your need for medications and monitor your lab values.   The results of significant diagnostics from this hospitalization (including imaging, microbiology, ancillary and laboratory) are listed below for reference.      Procedures/Studies:  DG Chest Portable 1 View  Result Date: 07/23/2020 CLINICAL DATA:  Dyspnea EXAM: PORTABLE CHEST 1 VIEW COMPARISON:  05/31/2017 FINDINGS: Right internal jugular central venous catheter tip is seen within the superior vena cava. Lung volumes are small and pulmonary insufflation has slightly diminished since prior examination. Superimposed asymmetric patchy pulmonary infiltrates have developed within the right lung and left lung base, likely infectious or inflammatory in nature. No pneumothorax or pleural effusion. Coronary artery bypass grafting has been performed. Cardiac size within normal limits. Atherosclerotic calcification noted within a mildly tortuous thoracic aorta. No acute bone abnormality. IMPRESSION: Interval development of patchy bilateral asymmetric pulmonary infiltrates, likely infectious or inflammatory in nature. Mild pulmonary hypoinflation.  There Electronically Signed   By: Fidela Salisbury MD   On: 07/23/2020 04:52   ECHOCARDIOGRAM COMPLETE  Result Date: 07/23/2020    ECHOCARDIOGRAM REPORT   Patient Name:   Makayla Fisher Date of Exam: 07/23/2020 Medical Rec #:  [id number]         Height:       60.0 in Accession #:    [id number]       Weight:       160.9 lb Date of Birth:  [birth year]       BSA:          1.702 m Patient Age:    87 years         BP:           133/49 mmHg Patient Gender: F                HR:           57 bpm. Exam Location:  Inpatient Procedure: 2D Echo, Cardiac Doppler and  Color Doppler Indications:    CHF  History:        Patient has prior history of Echocardiogram examinations, most                 recent 11/27/2019. Acute MI.  Sonographer:    Luisa Hart RDCS Referring Phys: [place]  1. Left ventricular ejection fraction, by estimation, is 50 to 55%. The left ventricle has low normal function. The left ventricle demonstrates regional wall motion abnormalities (see scoring diagram/findings for description). Left ventricular diastolic  parameters are consistent with Grade II diastolic dysfunction (pseudonormalization). Elevated left ventricular end-diastolic pressure. There is mild hypokinesis of the left ventricular, mid-apical inferior wall, inferoseptal wall and anteroseptal wall.  2. Right ventricular systolic function is normal. The right ventricular size is normal. There is mildly elevated pulmonary artery systolic pressure.  3. Left atrial size was severely dilated.  4. Right atrial size was mild to moderately dilated.  5. The mitral valve is degenerative. Mild to moderate mitral valve regurgitation. Moderate mitral annular calcification.  6. The aortic valve is grossly normal. There is mild calcification of the aortic valve. There is mild thickening of the aortic valve. Aortic valve regurgitation is not visualized. Mild aortic valve sclerosis is present, with no evidence of aortic valve stenosis.  7. The inferior vena cava is normal in size with greater than 50% respiratory variability, suggesting right atrial pressure of 3 mmHg. Comparison(s): Changes from prior study are noted. Conclusion(s)/Recommendation(s): Focal wall motion abnormalities  noted, with low normal EF and no severe valve disease. FINDINGS  Left Ventricle: Left ventricular ejection fraction, by estimation, is 50 to 55%. The left ventricle has low normal function. The left ventricle demonstrates regional wall motion abnormalities. Mild hypokinesis of the left ventricular, mid-apical inferior wall, inferoseptal wall and anteroseptal wall. The left ventricular internal cavity size was normal in size. There is no left ventricular hypertrophy. Left ventricular diastolic parameters are consistent with Grade II diastolic dysfunction (pseudonormalization). Elevated left ventricular end-diastolic pressure. Right Ventricle: The right ventricular size is normal. No increase in right ventricular wall thickness. Right ventricular systolic function is normal. There is mildly elevated pulmonary artery systolic pressure. The tricuspid regurgitant velocity is 3.12  m/s, and with an assumed right atrial pressure of 3 mmHg, the estimated right ventricular systolic pressure is 16.0 mmHg. Left Atrium: Left atrial size was severely dilated. Right Atrium: Right atrial size was mild to moderately dilated. Pericardium: There is no evidence of pericardial effusion. Mitral Valve: The mitral valve is degenerative in appearance. There is moderate thickening of the mitral valve leaflet(s). There is moderate calcification of the mitral valve leaflet(s). Moderate mitral annular calcification. Mild to moderate mitral valve regurgitation. Tricuspid Valve: The tricuspid valve is normal in structure. Tricuspid valve regurgitation is trivial. Aortic Valve: The aortic valve is grossly normal. There is mild calcification of the aortic valve. There is mild thickening of the aortic valve. Aortic valve regurgitation is not visualized. Mild aortic valve sclerosis is present, with no evidence of aortic valve stenosis. Aortic valve mean gradient measures 7.0 mmHg. Aortic valve peak gradient measures 15.2 mmHg. Aortic valve area, by  VTI measures 1.76 cm. Pulmonic Valve: The pulmonic valve was not well visualized. Pulmonic valve regurgitation is not visualized. Aorta: The aortic root, ascending aorta and aortic arch are all structurally normal, with no evidence of dilitation or obstruction. Venous: The inferior vena cava is normal in size with greater than 50% respiratory variability, suggesting right atrial pressure of  3 mmHg. IAS/Shunts: The atrial septum is grossly normal.  LEFT VENTRICLE PLAX 2D LVIDd:         5.10 cm     Diastology LVIDs:         3.00 cm     LV e' medial:    4.19 cm/s LV PW:         0.80 cm     LV E/e' medial:  28.2 LV IVS:        0.70 cm     LV e' lateral:   7.90 cm/s LVOT diam:     1.90 cm     LV E/e' lateral: 14.9 LV SV:         75 LV SV Index:   44 LVOT Area:     2.84 cm  LV Volumes (MOD) LV vol d, MOD A2C: 50.5 ml LV vol d, MOD A4C: 62.3 ml LV vol s, MOD A2C: 30.7 ml LV vol s, MOD A4C: 35.6 ml LV SV MOD A2C:     19.8 ml LV SV MOD A4C:     62.3 ml LV SV MOD BP:      21.9 ml RIGHT VENTRICLE RV S prime:     8.31 cm/s TAPSE (M-mode): 1.7 cm LEFT ATRIUM             Index       RIGHT ATRIUM           Index LA Vol (A2C):   96.5 ml 56.70 ml/m RA Area:     13.40 cm LA Vol (A4C):   86.6 ml 50.88 ml/m RA Volume:   30.80 ml  18.10 ml/m LA Biplane Vol: 94.1 ml 55.29 ml/m  AORTIC VALVE                    PULMONIC VALVE AV Area (Vmax):    1.79 cm     PV Vmax:       0.71 m/s AV Area (Vmean):   1.86 cm     PV Peak grad:  2.0 mmHg AV Area (VTI):     1.76 cm AV Vmax:           195.00 cm/s AV Vmean:          125.000 cm/s AV VTI:            0.428 m AV Peak Grad:      15.2 mmHg AV Mean Grad:      7.0 mmHg LVOT Vmax:         123.00 cm/s LVOT Vmean:        81.900 cm/s LVOT VTI:          0.265 m LVOT/AV VTI ratio: 0.62  AORTA Ao Asc diam: 3.00 cm MITRAL VALVE                 TRICUSPID VALVE MV Area (PHT): 3.42 cm      TR Peak grad:   38.9 mmHg MV Decel Time: 222 msec      TR Vmax:        312.00 cm/s MR Peak grad:    88.7 mmHg MR  Vmax:         471.00 cm/s SHUNTS MR PISA:         1.57 cm    Systemic VTI:  0.26 m MR PISA Eff ROA: 8 mm       Systemic Diam: 1.90 cm MR PISA Radius:  0.50 cm MV E velocity: 118.00 cm/s MV  A velocity: 94.10 cm/s MV E/A ratio:  1.25 Buford Dresser MD Electronically signed by Buford Dresser MD Signature Date/Time: 07/23/2020/8:13:32 PM    Final        LAB RESULTS: Basic Metabolic Panel: Recent Labs  Lab 07/26/20 0450 07/27/20 0505  NA 140 139  K 4.5 4.2  CL 102 101  CO2 30 29  GLUCOSE 104* 93  BUN 36* 35*  CREATININE 1.36* 1.25*  CALCIUM 9.2 8.8*   Liver Function Tests: Recent Labs  Lab 07/23/20 0500  AST 22  ALT 12  ALKPHOS 57  BILITOT 1.0  PROT 6.1*  ALBUMIN 3.7   No results for input(s): LIPASE, AMYLASE in the last 168 hours. No results for input(s): AMMONIA in the last 168 hours. CBC: Recent Labs  Lab 07/23/20 0500 07/24/20 0058 07/25/20 0008 07/26/20 0450  WBC 10.1   < > 9.0 8.7  NEUTROABS 7.1  --   --   --   HGB 14.1   < > 13.5 13.2  HCT 45.0   < > 40.8 39.3  MCV 100.9*   < > 96.9 98.3  PLT 140*   < > 142* 149*   < > = values in this interval not displayed.   Cardiac Enzymes: No results for input(s): CKTOTAL, CKMB, CKMBINDEX, TROPONINI in the last 168 hours. BNP: Invalid input(s): POCBNP CBG: No results for input(s): GLUCAP in the last 168 hours.     Disposition and Follow-up: Discharge Instructions    (HEART FAILURE PATIENTS) Call MD:  Anytime you have any of the following symptoms: 1) 3 pound weight gain in 24 hours or 5 pounds in 1 week 2) shortness of breath, with or without a dry hacking cough 3) swelling in the hands, feet or stomach 4) if you have to sleep on extra pillows at night in order to breathe.   Complete by: As directed    Diet - low sodium heart healthy   Complete by: As directed    If the dressing is still on your incision site when you go home, remove it on the third day after your surgery date. Remove dressing if  it begins to fall off, or if it is dirty or damaged before the third day.   Complete by: As directed    Increase activity slowly   Complete by: As directed        DISPOSITION: Wallace, Peter, DO. Schedule an appointment as soon as possible for a visit in 2 week(s).   Specialty: Family Medicine Contact information: [place] [address] [phone]        Josue Hector, MD Follow up in 2 week(s).   Specialty: Cardiology Contact information: [address] [place] [address] [phone]                Time coordinating discharge:  31mins   Signed:   Estill Cotta M.D. [facility] 07/27/2020, 12:07 PM

## 2020-07-27 NOTE — TOC Transition Note (Deleted)
Transition of Care [facility]) - CM/SW Discharge Note   Patient Details  Name: Makayla Fisher MRN: [id number] Date of Birth: [birth year]  Transition of [facility]) CM/SW Contact:  Tresa Endo Phone Number: 07/27/2020, 12:36 PM   Clinical Narrative:    Patient will DC to: [facility] [place], [place] Anticipated DC date: 07/27/2020 Family notified: [name] (niece) Transport by: Corey Harold   Per MD patient ready for DC to [facility] . RN to call report prior to discharge [phone]. RN, patient, patient's family, and facility notified of DC. Discharge Summary and FL2 sent to facility. DC packet on chart. Ambulance transport requested for patient.   CSW will sign off for now as social work intervention is no longer needed. Please consult [place] again if new needs arise.            Patient Goals and CMS Choice Patient states their goals for this hospitalization and ongoing recovery are:: to return to [facility] CMS Medicare.gov Compare Post Acute Care list provided to:: Patient Choice offered to / list presented to : Patient (niece [name])  Discharge Placement                       Discharge Plan and Services     Post Acute Care Choice: Home Health            DME Agency: NA                  Social Determinants of Health (SDOH) Interventions     Readmission Risk Interventions No flowsheet data found.

## 2020-07-27 NOTE — Progress Notes (Signed)
SATURATION QUALIFICATIONS: (This note is used to comply with regulatory documentation for home oxygen)  Patient Saturations on Room Air at Rest = %  Patient Saturations on Room Air while Ambulating = 92%  Patient Saturations on  Liters of oxygen while Ambulating = %  Please briefly explain why patient needs home oxygen: N/A

## 2020-07-27 NOTE — Progress Notes (Signed)
O2 assessment with PT evaluation 93% RA at resting 92% RA with activity

## 2020-07-27 NOTE — Progress Notes (Signed)
Cardiology Progress Note  Patient ID: Makayla Fisher MRN: [id number] DOB: [birth year] Date of Encounter: 07/27/2020  Primary Cardiologist: Jenkins Rouge, MD  Subjective   Chief Complaint: None.  HPI: Denies chest pain.  Seems to be doing well.  Comfortable this morning.  Very hard of hearing.  ROS:  All other ROS reviewed and negative. Pertinent positives noted in the HPI.     Inpatient Medications  Scheduled Meds: . aspirin EC  81 mg Oral Daily  . atorvastatin  40 mg Oral QHS  . clopidogrel  75 mg Oral Daily  . docusate sodium  100 mg Oral BID  . feeding supplement  237 mL Oral BID BM  . levothyroxine  50 mcg Oral QAC breakfast  . multivitamin with minerals  1 tablet Oral Daily  . psyllium  1 packet Oral Q M,W,F  . rOPINIRole  1 mg Oral QHS  . sertraline  25 mg Oral Daily  . sodium chloride flush  10-40 mL Intracatheter Q12H  . sodium chloride flush  3 mL Intravenous Q12H  . timolol  1 drop Both Eyes BID  . umeclidinium bromide  1 puff Inhalation Daily   Continuous Infusions:  PRN Meds: acetaminophen **OR** acetaminophen, bisacodyl, hydrALAZINE, morphine injection, ondansetron **OR** ondansetron (ZOFRAN) IV, oxyCODONE, polyethylene glycol, psyllium, sodium chloride flush, traZODone   Vital Signs   Vitals:   07/27/20 0012 07/27/20 0351 07/27/20 0500 07/27/20 0749  BP:  (!) 111/44    Pulse:  (!) 53    Resp:  16    Temp:  98.9 F (37.2 C)    TempSrc:  Oral    SpO2:  93%  93%  Weight: 73.1 kg  73.1 kg     Intake/Output Summary (Last 24 hours) at 07/27/2020 0855 Last data filed at 07/27/2020 8563 Gross per 24 hour  Intake 863 ml  Output 1975 ml  Net -1112 ml   Last 3 Weights 07/27/2020 07/27/2020 07/26/2020  Weight (lbs) 161 lb 2.5 oz 161 lb 2.5 oz 154 lb 1.6 oz  Weight (kg) 73.1 kg 73.1 kg 69.9 kg      Telemetry  Overnight telemetry shows sinus bradycardia heart rate in the 40s, isorhythmic dissociation was present, which I personally reviewed.   ECG  The most  recent ECG shows sinus rhythm heart rate 91, nonspecific ST-T changes, which I personally reviewed.   Physical Exam   Vitals:   07/27/20 0012 07/27/20 0351 07/27/20 0500 07/27/20 0749  BP:  (!) 111/44    Pulse:  (!) 53    Resp:  16    Temp:  98.9 F (37.2 C)    TempSrc:  Oral    SpO2:  93%  93%  Weight: 73.1 kg  73.1 kg      Intake/Output Summary (Last 24 hours) at 07/27/2020 0855 Last data filed at 07/27/2020 1497 Gross per 24 hour  Intake 863 ml  Output 1975 ml  Net -1112 ml    Last 3 Weights 07/27/2020 07/27/2020 07/26/2020  Weight (lbs) 161 lb 2.5 oz 161 lb 2.5 oz 154 lb 1.6 oz  Weight (kg) 73.1 kg 73.1 kg 69.9 kg    Body mass index is 31.47 kg/m.  General: Well nourished, well developed, in no acute distress Head: Atraumatic, normal size  Eyes: PEERLA, EOMI  Neck: Supple, no JVD Endocrine: No thryomegaly Cardiac: Normal S1, S2; RRR; no murmurs, rubs, or gallops Lungs: Clear to auscultation bilaterally, no wheezing, rhonchi or rales  Abd: Soft, nontender, no hepatomegaly  Ext: No  edema, pulses 2+ Musculoskeletal: No deformities, BUE and BLE strength normal and equal Skin: Warm and dry, no rashes   Neuro: Alert and oriented to person, place, time, and situation, CNII-XII grossly intact, no focal deficits  Psych: Normal mood and affect   Labs  High Sensitivity Troponin:   Recent Labs  Lab 07/23/20 0500 07/23/20 0629 07/23/20 1022 07/23/20 1220  TROPONINIHS 221* 895* 2,058* 1,762*     Cardiac EnzymesNo results for input(s): TROPONINI in the last 168 hours. No results for input(s): TROPIPOC in the last 168 hours.  Chemistry Recent Labs  Lab 07/23/20 0500 07/24/20 0058 07/25/20 0820 07/26/20 0450 07/27/20 0505  NA 140   < > 138 140 139  K 3.9   < > 4.4 4.5 4.2  CL 104   < > 101 102 101  CO2 25   < > 28 30 29   GLUCOSE 140*   < > 101* 104* 93  BUN 14   < > 33* 36* 35*  CREATININE 0.95   < > 1.53* 1.36* 1.25*  CALCIUM 10.0   < > 9.2 9.2 8.8*  PROT 6.1*  --   --    --   --   ALBUMIN 3.7  --   --   --   --   AST 22  --   --   --   --   ALT 12  --   --   --   --   ALKPHOS 57  --   --   --   --   BILITOT 1.0  --   --   --   --   GFRNONAA 55*   < > 31* 36* 40*  ANIONGAP 11   < > 9 8 9    < > = values in this interval not displayed.    Hematology Recent Labs  Lab 07/24/20 0058 07/25/20 0008 07/26/20 0450  WBC 8.7 9.0 8.7  RBC 4.08 4.21 4.00  HGB 13.2 13.5 13.2  HCT 39.0 40.8 39.3  MCV 95.6 96.9 98.3  MCH 32.4 32.1 33.0  MCHC 33.8 33.1 33.6  RDW 14.3 14.6 14.5  PLT 129* 142* 149*   BNP Recent Labs  Lab 07/23/20 0500  BNP 517.5*    DDimer No results for input(s): DDIMER in the last 168 hours.   Radiology  No results found.  Cardiac Studies  TTE 07/23/2020 1. Left ventricular ejection fraction, by estimation, is 50 to 55%. The  left ventricle has low normal function. The left ventricle demonstrates  regional wall motion abnormalities (see scoring diagram/findings for  description). Left ventricular diastolic  parameters are consistent with Grade II diastolic dysfunction  (pseudonormalization). Elevated left ventricular end-diastolic pressure.  There is mild hypokinesis of the left ventricular, mid-apical inferior  wall, inferoseptal wall and anteroseptal wall.  2. Right ventricular systolic function is normal. The right ventricular  size is normal. There is mildly elevated pulmonary artery systolic  pressure.  3. Left atrial size was severely dilated.  4. Right atrial size was mild to moderately dilated.  5. The mitral valve is degenerative. Mild to moderate mitral valve  regurgitation. Moderate mitral annular calcification.  6. The aortic valve is grossly normal. There is mild calcification of the  aortic valve. There is mild thickening of the aortic valve. Aortic valve  regurgitation is not visualized. Mild aortic valve sclerosis is present,  with no evidence of aortic valve  stenosis.  7. The inferior vena cava is  normal in size  with greater than 50%  respiratory variability, suggesting right atrial pressure of 3 mmHg.   Patient Profile  Makayla Fisher is a 87 y.o. female with non-Hodgkin's lymphoma, CAD status post CABG, prior HIT who was admitted on 07/23/2020 with acute flash pulmonary edema and non-STEMI.  Pulmonary edema was attributed to hypertensive crisis.  Assessment & Plan   1.  Non-STEMI -Medical management was decided by the previous team.  No chest pain.  Globally EF is preserved. -Continue aspirin Plavix for 1 year. -Continue Lipitor. -Bradycardia noted.  Also with isorhythmic dissociation.  Withhold beta-blocker. -Appears stable for discharge.  2.  Hypertension -ACE inhibitor held.  Add Norvasc 5 mg daily. -We will also add Lasix 20 mg daily.  [facility] will sign off.   Medication Recommendations: As above. Other recommendations (labs, testing, etc): None. Follow up as an outpatient: We will arrange hospital follow-up in 2 to 4 weeks with Dr. Jenkins Rouge  For questions or updates, please contact [facility] Please consult [email or web address] for contact info under   Time Spent with Patient: I have spent a total of 25 minutes with patient reviewing hospital notes, telemetry, EKGs, labs and examining the patient as well as establishing an assessment and plan that was discussed with the patient.  > 50% of time was spent in direct patient care.    Signed, Addison Naegeli. Audie Box, MD, [place]  07/27/2020 8:55 AM

## 2020-07-27 NOTE — Plan of Care (Signed)
  Problem: Acute Rehab PT Goals(only PT should resolve) Goal: Pt Will Go Supine/Side To Sit Outcome: Adequate for Discharge Goal: Pt Will Go Sit To Supine/Side Outcome: Adequate for Discharge Goal: Patient Will Transfer Sit To/From Stand Outcome: Adequate for Discharge Goal: Pt Will Ambulate Outcome: Adequate for Discharge   Problem: Increased Nutrient Needs (NI-5.1) Goal: Food and/or nutrient delivery Description: Individualized approach for food/nutrient provision. Outcome: Adequate for Discharge   Problem: Education: Goal: Knowledge of General Education information will improve Description: Including pain rating scale, medication(s)/side effects and non-pharmacologic comfort measures Outcome: Adequate for Discharge   Problem: Health Behavior/Discharge Planning: Goal: Ability to manage health-related needs will improve Outcome: Adequate for Discharge

## 2020-07-27 NOTE — Progress Notes (Signed)
Physical Therapy Treatment Patient Details Name: Makayla Fisher MRN: [id number] DOB: 12-Dec-1922 Today's Date: 07/27/2020    History of Present Illness 87 y/o female admitted secondary to increased SOB. Thought to be secondary to CHF exacerbation. PMH includes CAD s/p CABG, CVA, and s/p colostomy.    PT Comments    Patient progressing well towards PT goals. Pt sleepy upon arrival and having difficulty hearing. Tolerated bed mobility and transfers with supervision-Min guard for safety. Tolerated gait training with use of RW for support. Sp02 remained >92% on RA during activity and no dizziness reported. Pt anxious about what her 02 sats reading. Encouraged mobility at home. Will follow.    Follow Up Recommendations  Home health PT;Supervision for mobility/OOB     Equipment Recommendations  None recommended by PT    Recommendations for Other Services       Precautions / Restrictions Precautions Precautions: Fall Restrictions Weight Bearing Restrictions: No    Mobility  Bed Mobility Overal bed mobility: Needs Assistance Bed Mobility: Supine to Sit     Supine to sit: HOB elevated;Supervision Sit to supine: Supervision;HOB elevated   General bed mobility comments: Use of rail to get to EOB. Able to return to supine, no assist needed.    Transfers Overall transfer level: Needs assistance Equipment used: Rolling walker (2 wheeled) Transfers: Sit to/from Stand Sit to Stand: Supervision         General transfer comment: Supervision for safety. Stood from EOB x1, no difficulties, no dizziness.  Ambulation/Gait Ambulation/Gait assistance: Supervision Gait Distance (Feet): 40 Feet Assistive device: Rolling walker (2 wheeled) Gait Pattern/deviations: Step-through pattern;Decreased stride length;Trunk flexed Gait velocity: reduced   General Gait Details: Slow, steady gait with RW for support; no DOE noted. SP02 remained >92%. Pt highly anxious about what her numbers  read.   Stairs             Wheelchair Mobility    Modified Rankin (Stroke Patients Only)       Balance Overall balance assessment: Needs assistance Sitting-balance support: No upper extremity supported;Feet supported Sitting balance-Leahy Scale: Good     Standing balance support: During functional activity Standing balance-Leahy Scale: Poor Standing balance comment: Use of UEs for support on RW.                            Cognition Arousal/Alertness: Awake/alert Behavior During Therapy: WFL for tasks assessed/performed Overall Cognitive Status: Within Functional Limits for tasks assessed                                 General Comments: Very HOH, reports anxiety.      Exercises      General Comments General comments (skin integrity, edema, etc.): Pt sleepy upon arrival.      Pertinent Vitals/Pain Pain Assessment: No/denies pain    Home Living                      Prior Function            PT Goals (current goals can now be found in the care plan section) Progress towards PT goals: Progressing toward goals    Frequency    Min 3X/week      PT Plan Current plan remains appropriate    Co-evaluation              AM-PAC PT "6 Clicks" Mobility  Outcome Measure  Help needed turning from your back to your side while in a flat bed without using bedrails?: A Little Help needed moving from lying on your back to sitting on the side of a flat bed without using bedrails?: A Little Help needed moving to and from a bed to a chair (including a wheelchair)?: A Little Help needed standing up from a chair using your arms (e.g., wheelchair or bedside chair)?: A Little Help needed to walk in hospital room?: A Little Help needed climbing 3-5 steps with a railing? : A Little 6 Click Score: 18    End of Session Equipment Utilized During Treatment: Gait belt Activity Tolerance: Patient tolerated treatment well Patient  left: in bed;with call bell/phone within reach;with bed alarm set Nurse Communication: Mobility status PT Visit Diagnosis: Unsteadiness on feet (R26.81);Muscle weakness (generalized) (M62.81)     Time: 5277-8242 PT Time Calculation (min) (ACUTE ONLY): 29 min  Charges:  $Gait Training: 8-22 mins $Therapeutic Activity: 8-22 mins                     Marisa Severin, PT, DPT Acute Rehabilitation Services Pager [phone] Office [place] 07/27/2020, 11:19 AM

## 2020-07-27 NOTE — Progress Notes (Signed)
D/C instructions given and reviewed. Family at bedside to transport.

## 2020-07-27 NOTE — TOC Transition Note (Signed)
Transition of Care [facility]) - CM/SW Discharge Note   Patient Details  Name: Makayla Fisher MRN: [id number] Date of Birth: 1922/05/06  [facility] CM/SW Contact:  Tresa Endo Phone Number: 07/27/2020, 2:31 PM   Clinical Narrative:     Clinical Narrative:    Patient will DC to: [facility] [place], [place] Anticipated DC date: 07/27/2020 Family notified: [name] (niece) Transport by: Corey Harold   Per MD patient ready for DC to [facility] . RN to call report prior to [phone]. RN, patient, patient's family, and facility notified of DC. Discharge Summary and FL2 sent to facility. DC packet on chart.  CSW will sign off for now as social work intervention is no longer needed. Please consult [place] again if new needs arise.        Patient Goals and CMS Choice Patient states their goals for this hospitalization and ongoing recovery are:: to return to [facility] CMS Medicare.gov Compare Post Acute Care list provided to:: Patient Choice offered to / list presented to : Patient (niece [name])  Discharge Placement                       Discharge Plan and Services     Post Acute Care Choice: Home Health            DME Agency: NA                  Social Determinants of Health (SDOH) Interventions     Readmission Risk Interventions No flowsheet data found.

## 2020-07-27 NOTE — TOC Progression Note (Addendum)
Transition of Care [facility]) - Progression Note    Patient Details  Name: Makayla Fisher MRN: [id number] Date of Birth: 01-Jun-1922  Transition of Care [facility]) CM/SW [place], [place] Phone Number: 07/27/2020, 12:29 PM  Clinical Narrative:    12:20pm- [facility] requested FL2 before DC, CSW sent hard fax of FL2 and updated PT note, [facility] will contact for DC after reviewing FL2 and updated PT note.  12:45pm- DC Summary was faxed over to admissions   [facility] [place], [place]  [phone]   Expected Discharge Plan: Assisted Living (with home health)    Expected Discharge Plan and Services Expected Discharge Plan: Assisted Living (with home health)     Post Acute Care Choice: [place] arrangements for the past 2 months: Apartment Expected Discharge Date: 07/27/20                 DME Agency: NA                   Social Determinants of Health (SDOH) Interventions    Readmission Risk Interventions No flowsheet data found.

## 2020-08-18 ENCOUNTER — Ambulatory Visit: Payer: Medicare Other | Admitting: Student

## 2020-09-10 ENCOUNTER — Other Ambulatory Visit: Payer: Self-pay

## 2020-09-10 ENCOUNTER — Inpatient Hospital Stay: Payer: Medicare Other | Attending: Hematology & Oncology

## 2020-09-10 VITALS — BP 138/57 | HR 65 | Temp 97.7°F | Resp 18

## 2020-09-10 DIAGNOSIS — Z452 Encounter for adjustment and management of vascular access device: Secondary | ICD-10-CM | POA: Insufficient documentation

## 2020-09-10 DIAGNOSIS — Z95828 Presence of other vascular implants and grafts: Secondary | ICD-10-CM

## 2020-09-10 DIAGNOSIS — D508 Other iron deficiency anemias: Secondary | ICD-10-CM | POA: Insufficient documentation

## 2020-09-10 DIAGNOSIS — Z8572 Personal history of non-Hodgkin lymphomas: Secondary | ICD-10-CM | POA: Diagnosis present

## 2020-09-10 MED ORDER — HEPARIN SOD (PORK) LOCK FLUSH 100 UNIT/ML IV SOLN
500.0000 [IU] | Freq: Once | INTRAVENOUS | Status: AC
  Administered 2020-09-10: 500 [IU] via INTRAVENOUS
  Filled 2020-09-10: qty 5

## 2020-09-10 MED ORDER — SODIUM CHLORIDE 0.9% FLUSH
10.0000 mL | Freq: Once | INTRAVENOUS | Status: AC
Start: 2020-09-10 — End: 2020-09-10
  Administered 2020-09-10: 10 mL via INTRAVENOUS
  Filled 2020-09-10: qty 10

## 2020-09-10 NOTE — Patient Instructions (Signed)
Implanted Port Insertion, Care After This sheet gives you information about how to care for yourself after your procedure. Your health care provider may also give you more specific instructions. If you have problems or questions, contact your health care provider. What can I expect after the procedure? After the procedure, it is common to have:  Discomfort at the port insertion site.  Bruising on the skin over the port. This should improve over 3-4 days. Follow these instructions at home: Port care  After your port is placed, you will get a manufacturer's information card. The card has information about your port. Keep this card with you at all times.  Take care of the port as told by your health care provider. Ask your health care provider if you or a family member can get training for taking care of the port at home. A home health care nurse may also take care of the port.  Make sure to remember what type of port you have. Incision care  Follow instructions from your health care provider about how to take care of your port insertion site. Make sure you: ? Wash your hands with soap and water before and after you change your bandage (dressing). If soap and water are not available, use hand sanitizer. ? Change your dressing as told by your health care provider. ? Leave stitches (sutures), skin glue, or adhesive strips in place. These skin closures may need to stay in place for 2 weeks or longer. If adhesive strip edges start to loosen and curl up, you may trim the loose edges. Do not remove adhesive strips completely unless your health care provider tells you to do that.  Check your port insertion site every day for signs of infection. Check for: ? Redness, swelling, or pain. ? Fluid or blood. ? Warmth. ? Pus or a bad smell.      Activity  Return to your normal activities as told by your health care provider. Ask your health care provider what activities are safe for you.  Do not  lift anything that is heavier than 10 lb (4.5 kg), or the limit that you are told, until your health care provider says that it is safe. General instructions  Take over-the-counter and prescription medicines only as told by your health care provider.  Do not take baths, swim, or use a hot tub until your health care provider approves. Ask your health care provider if you may take showers. You may only be allowed to take sponge baths.  Do not drive for 24 hours if you were given a sedative during your procedure.  Wear a medical alert bracelet in case of an emergency. This will tell any health care providers that you have a port.  Keep all follow-up visits as told by your health care provider. This is important. Contact a health care provider if:  You cannot flush your port with saline as directed, or you cannot draw blood from the port.  You have a fever or chills.  You have redness, swelling, or pain around your port insertion site.  You have fluid or blood coming from your port insertion site.  Your port insertion site feels warm to the touch.  You have pus or a bad smell coming from the port insertion site. Get help right away if:  You have chest pain or shortness of breath.  You have bleeding from your port that you cannot control. Summary  Take care of the port as told by your   health care provider. Keep the manufacturer's information card with you at all times.  Change your dressing as told by your health care provider.  Contact a health care provider if you have a fever or chills or if you have redness, swelling, or pain around your port insertion site.  Keep all follow-up visits as told by your health care provider. This information is not intended to replace advice given to you by your health care provider. Make sure you discuss any questions you have with your health care provider. Document Revised: 11/07/2017 Document Reviewed: 11/07/2017 Elsevier Patient Education   2021 [employer]  

## 2020-10-02 ENCOUNTER — Telehealth: Payer: Self-pay

## 2020-10-06 ENCOUNTER — Telehealth: Payer: Self-pay

## 2020-10-06 NOTE — Telephone Encounter (Signed)
Returned pt s call and r/s appt to a Monday and left a vm with new appts     Makayla Fisher

## 2020-10-27 ENCOUNTER — Ambulatory Visit: Payer: Medicare Other | Admitting: Cardiology

## 2020-11-05 ENCOUNTER — Ambulatory Visit: Payer: Medicare Other | Admitting: Hematology & Oncology

## 2020-11-05 ENCOUNTER — Other Ambulatory Visit: Payer: Medicare Other

## 2020-11-18 ENCOUNTER — Ambulatory Visit: Payer: Medicare Other | Admitting: Hematology & Oncology

## 2020-11-18 ENCOUNTER — Other Ambulatory Visit: Payer: Medicare Other

## 2020-11-23 ENCOUNTER — Encounter: Payer: Self-pay | Admitting: Hematology & Oncology

## 2020-11-23 ENCOUNTER — Inpatient Hospital Stay: Payer: Medicare Other

## 2020-11-23 ENCOUNTER — Telehealth: Payer: Self-pay

## 2020-11-23 ENCOUNTER — Inpatient Hospital Stay: Payer: Medicare Other | Attending: Hematology & Oncology

## 2020-11-23 ENCOUNTER — Inpatient Hospital Stay (HOSPITAL_BASED_OUTPATIENT_CLINIC_OR_DEPARTMENT_OTHER): Payer: Medicare Other | Admitting: Hematology & Oncology

## 2020-11-23 ENCOUNTER — Other Ambulatory Visit: Payer: Self-pay

## 2020-11-23 VITALS — BP 157/69 | HR 53 | Temp 98.2°F | Resp 17 | Wt 153.0 lb

## 2020-11-23 DIAGNOSIS — K909 Intestinal malabsorption, unspecified: Secondary | ICD-10-CM | POA: Diagnosis not present

## 2020-11-23 DIAGNOSIS — Z7982 Long term (current) use of aspirin: Secondary | ICD-10-CM | POA: Diagnosis not present

## 2020-11-23 DIAGNOSIS — D508 Other iron deficiency anemias: Secondary | ICD-10-CM | POA: Diagnosis present

## 2020-11-23 DIAGNOSIS — C8225 Follicular lymphoma grade III, unspecified, lymph nodes of inguinal region and lower limb: Secondary | ICD-10-CM

## 2020-11-23 DIAGNOSIS — Z79899 Other long term (current) drug therapy: Secondary | ICD-10-CM | POA: Insufficient documentation

## 2020-11-23 DIAGNOSIS — Z95828 Presence of other vascular implants and grafts: Secondary | ICD-10-CM

## 2020-11-23 DIAGNOSIS — I158 Other secondary hypertension: Secondary | ICD-10-CM

## 2020-11-23 DIAGNOSIS — C8201 Follicular lymphoma grade I, lymph nodes of head, face, and neck: Secondary | ICD-10-CM

## 2020-11-23 DIAGNOSIS — Z8572 Personal history of non-Hodgkin lymphomas: Secondary | ICD-10-CM | POA: Insufficient documentation

## 2020-11-23 DIAGNOSIS — E038 Other specified hypothyroidism: Secondary | ICD-10-CM

## 2020-11-23 DIAGNOSIS — Z452 Encounter for adjustment and management of vascular access device: Secondary | ICD-10-CM | POA: Insufficient documentation

## 2020-11-23 DIAGNOSIS — D500 Iron deficiency anemia secondary to blood loss (chronic): Secondary | ICD-10-CM

## 2020-11-23 LAB — CBC WITH DIFFERENTIAL (CANCER CENTER ONLY)
Abs Immature Granulocytes: 0.02 10*3/uL (ref 0.00–0.07)
Basophils Absolute: 0.1 10*3/uL (ref 0.0–0.1)
Basophils Relative: 1 %
Eosinophils Absolute: 0.3 10*3/uL (ref 0.0–0.5)
Eosinophils Relative: 3 %
HCT: 41.6 % (ref 36.0–46.0)
Hemoglobin: 13.6 g/dL (ref 12.0–15.0)
Immature Granulocytes: 0 %
Lymphocytes Relative: 27 %
Lymphs Abs: 2.3 10*3/uL (ref 0.7–4.0)
MCH: 31.5 pg (ref 26.0–34.0)
MCHC: 32.7 g/dL (ref 30.0–36.0)
MCV: 96.3 fL (ref 80.0–100.0)
Monocytes Absolute: 0.8 10*3/uL (ref 0.1–1.0)
Monocytes Relative: 10 %
Neutro Abs: 5.1 10*3/uL (ref 1.7–7.7)
Neutrophils Relative %: 59 %
Platelet Count: 147 10*3/uL — ABNORMAL LOW (ref 150–400)
RBC: 4.32 MIL/uL (ref 3.87–5.11)
RDW: 13.9 % (ref 11.5–15.5)
WBC Count: 8.6 10*3/uL (ref 4.0–10.5)
nRBC: 0 % (ref 0.0–0.2)

## 2020-11-23 LAB — CMP (CANCER CENTER ONLY)
ALT: 11 U/L (ref 0–44)
AST: 19 U/L (ref 15–41)
Albumin: 4.1 g/dL (ref 3.5–5.0)
Alkaline Phosphatase: 61 U/L (ref 38–126)
Anion gap: 9 (ref 5–15)
BUN: 15 mg/dL (ref 8–23)
CO2: 27 mmol/L (ref 22–32)
Calcium: 9.7 mg/dL (ref 8.9–10.3)
Chloride: 103 mmol/L (ref 98–111)
Creatinine: 0.96 mg/dL (ref 0.44–1.00)
GFR, Estimated: 54 mL/min — ABNORMAL LOW (ref >60)
Glucose, Bld: 99 mg/dL (ref 70–99)
Potassium: 3.7 mmol/L (ref 3.5–5.1)
Sodium: 139 mmol/L (ref 135–145)
Total Bilirubin: 0.6 mg/dL (ref 0.3–1.2)
Total Protein: 6.1 g/dL — ABNORMAL LOW (ref 6.5–8.1)

## 2020-11-23 LAB — LACTATE DEHYDROGENASE: LDH: 208 U/L — ABNORMAL HIGH (ref 98–192)

## 2020-11-23 MED ORDER — HEPARIN SOD (PORK) LOCK FLUSH 100 UNIT/ML IV SOLN
500.0000 [IU] | Freq: Once | INTRAVENOUS | Status: AC
  Administered 2020-11-23: 500 [IU] via INTRAVENOUS
  Filled 2020-11-23: qty 5

## 2020-11-23 MED ORDER — SODIUM CHLORIDE 0.9% FLUSH
10.0000 mL | Freq: Once | INTRAVENOUS | Status: AC
  Administered 2020-11-23: 10 mL via INTRAVENOUS
  Filled 2020-11-23: qty 10

## 2020-11-23 NOTE — Addendum Note (Signed)
Addended by: Tyler Aas A on: 11/23/2020 01:45 PM   Modules accepted: Orders

## 2020-11-23 NOTE — Progress Notes (Deleted)
Hematology and Oncology Follow Up Visit  Makayla Fisher [id number] 08/14/1922 87 y.o. 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 is back for followup.  She seems to be doing pretty well.  She is now 87 years old.  She really does not have any other family in the area.  She is at the nursing home.  She does get lonely there.  I really do feel bad for her.  Apparently, she did have a TIA this past summer.  She now is on Lipitor and Plavix.  When we last saw her, we checked a TSH.  It was normal at 2.9.  We are checking her iron studies.  .  She has lost weight.  She is tried to lose weight.  She has had no associated symptoms with weight loss.  Her colostomy is working well.  She has had no nausea or vomiting.  She just is watching the number of calories that she takes in.  She has had no rashes.  There is been no fever.  She has had no problems with the coronavirus.  Overall, her performance status is by ECOG 2.     Medications:  Current Outpatient Medications:    amLODipine (NORVASC) 5 MG tablet, Take 1 tablet (5 mg total) by mouth daily., Disp: 30 tablet, Rfl: 2   aspirin 81 MG EC tablet, Take 1 tablet (81 mg total) by mouth daily. Swallow whole., Disp: 30 tablet, Rfl: 11   atorvastatin (LIPITOR) 40 MG tablet, Take 1 tablet (40 mg total) by mouth at bedtime., Disp: 30 tablet, Rfl: 30   Calcium Carb-Cholecalciferol (CALCIUM 600+D3 PO), Take 1 tablet by mouth at bedtime., Disp: , Rfl:    Cholecalciferol (VITAMIN D3 SUPER STRENGTH) 50 MCG (2000 UT) CAPS, Take 4,000 Units by mouth daily. , Disp: , Rfl:    clopidogrel (PLAVIX) 75 MG tablet, Take 1 tablet (75 mg total) by mouth daily., Disp: 30 tablet, Rfl: 2   Cranberry 425 MG CAPS, Take 425 mg  by mouth daily. , Disp: , Rfl:    erythromycin ophthalmic ointment, SMARTSIG:In Eye(s), Disp: , Rfl:    furosemide (LASIX) 20 MG tablet, Take 1 tablet (20 mg total) by mouth daily., Disp: 30 tablet, Rfl: 3   levothyroxine (SYNTHROID, LEVOTHROID) 50 MCG tablet, Take 50 mcg by mouth daily before breakfast. , Disp: , Rfl:    lidocaine-prilocaine (EMLA) cream, Apply 1 application topically See admin instructions. Apply to port-a-cath one hour prior to procedure, Disp: , Rfl:    Multiple Vitamin (DAILY VITE PO), Take 1 tablet by mouth See admin instructions. Daily at 12 noon, Disp: , Rfl:    Multiple Vitamins-Minerals (PRESERVISION AREDS PO), Take 1 tablet by mouth in the morning and at bedtime., Disp: , Rfl:    nystatin cream (MYCOSTATIN), Apply 1 application topically every 12 (twelve) hours as needed (to rash between the legs and groin)., Disp: , Rfl:    psyllium (METAMUCIL) 58.6 % packet, Take 1 packet by mouth every Monday, Wednesday, and Friday. ORANGE, Disp: , Rfl:    psyllium (METAMUCIL) 58.6 % packet, Take 1 packet by mouth daily as needed (constipation)., Disp: , Rfl:    RESTASIS 0.05 % ophthalmic emulsion, 1 drop 2 (two) times daily., Disp: , Rfl:  rOPINIRole (REQUIP) 1 MG tablet, Take 1 mg by mouth at bedtime., Disp: , Rfl:    sertraline (ZOLOFT) 25 MG tablet, Take 25 mg by mouth daily., Disp: , Rfl:    SPIRIVA HANDIHALER 18 MCG inhalation capsule, Place 18 mcg into inhaler and inhale daily. , Disp: , Rfl:    timolol (TIMOPTIC) 0.5 % ophthalmic solution, Place 1 drop into both eyes 2 (two) times daily. Affected eye(s), Disp: , Rfl:    trimethoprim (TRIMPEX) 100 MG tablet, Take 100 mg by mouth daily., Disp: , Rfl:   Allergies:  Allergies  Allergen Reactions   Epipen [Epinephrine Hcl (Nasal)] Palpitations   Levaquin [Levofloxacin In D5w] Other (See Comments)    Weakness- "Allergic," per MAR   Beta Adrenergic Blockers Other (See Comments)    intolerant to higher doses of BB due to  bradycardia.   Chlorhexidine Gluconate Other (See Comments)    Pt declined use and suspects she may have an intolerance   Heparin Other (See Comments)    "Alleregic," pre MAR- Maybe affected platelets, per patient   Sulfa Antibiotics Rash    Past Medical History, Surgical history, Social history, and Family History were reviewed and updated.  Review of Systems: Review of Systems  Constitutional: Negative.   HENT: Negative.    Eyes: Negative.   Respiratory:  Positive for shortness of breath.   Cardiovascular:  Positive for palpitations.  Gastrointestinal:  Positive for diarrhea and nausea.  Genitourinary: Negative.   Musculoskeletal:  Positive for joint pain and myalgias.  Skin: Negative.   Neurological:  Positive for tingling.  Endo/Heme/Allergies: Negative.   Psychiatric/Behavioral: Negative.      Physical Exam:  weight is 153 lb (69.4 kg). Her oral temperature is 98.2 F (36.8 C). Her blood pressure is 157/69 (abnormal) and her pulse is 53 (abnormal). Her respiration is 17 and oxygen saturation is 99%.   Physical Exam Vitals reviewed.  HENT:     Head: Normocephalic and atraumatic.  Eyes:     Pupils: Pupils are equal, round, and reactive to light.  Cardiovascular:     Rate and Rhythm: Normal rate and regular rhythm.     Heart sounds: Normal heart sounds.  Pulmonary:     Effort: Pulmonary effort is normal.     Breath sounds: Normal breath sounds.  Abdominal:     General: Bowel sounds are normal.     Palpations: Abdomen is soft.     Comments: Abdominal exam shows a colostomy in the left lower quadrant.  She has multiple surgical scars.  She has no fluid wave.  There is no guarding or rebound tenderness.  There is no palpable liver or spleen tip.  Musculoskeletal:        General: No tenderness or deformity. Normal range of motion.     Cervical back: Normal range of motion.  Lymphadenopathy:     Cervical: No cervical adenopathy.  Skin:    General: Skin is warm and  dry.     Findings: No erythema or rash.  Neurological:     Mental Status: She is alert and oriented to person, place, and time.  Psychiatric:        Behavior: Behavior normal.        Thought Content: Thought content normal.        Judgment: Judgment normal.     Lab Results  Component Value Date   WBC 8.6 11/23/2020   HGB 13.6 11/23/2020   HCT 41.6 11/23/2020   MCV 96.3 11/23/2020  PLT 147 (L) 11/23/2020     Chemistry      Component Value Date/Time   NA 139 07/27/2020 0505   NA 143 01/31/2017 0931   NA 141 05/08/2014 1041   K 4.2 07/27/2020 0505   K 3.7 01/31/2017 0931   K 4.5 05/08/2014 1041   CL 101 07/27/2020 0505   CL 106 01/31/2017 0931   CO2 29 07/27/2020 0505   CO2 27 01/31/2017 0931   CO2 24 05/08/2014 1041   BUN 35 (H) 07/27/2020 0505   BUN 10 01/31/2017 0931   BUN 14.2 05/08/2014 1041   CREATININE 1.25 (H) 07/27/2020 0505   CREATININE 0.89 05/21/2020 1319   CREATININE 0.9 01/31/2017 0931   CREATININE 0.8 05/08/2014 1041      Component Value Date/Time   CALCIUM 8.8 (L) 07/27/2020 0505   CALCIUM 9.7 01/31/2017 0931   CALCIUM 9.2 05/08/2014 1041   ALKPHOS 57 07/23/2020 0500   ALKPHOS 57 01/31/2017 0931   ALKPHOS 66 05/08/2014 1041   AST 22 07/23/2020 0500   AST 17 05/21/2020 1319   AST 23 05/08/2014 1041   ALT 12 07/23/2020 0500   ALT 9 05/21/2020 1319   ALT 14 01/31/2017 0931   ALT 10 05/08/2014 1041   BILITOT 1.0 07/23/2020 0500   BILITOT 0.5 05/21/2020 1319   BILITOT 0.42 05/08/2014 1041      Impression and Plan: Makayla Fisher is 87 year old female. She has a follicular large cell lymphoma. I do not see any evidence of recurrent disease. I do not believe that she is at significant risk for occurrence.   We will have to check her thyroid today.  We will see what her iron studies show.  It sounds like she is having some problems with her hearing aids.  Hopefully she can get these fixed.  The fact that she is 87 years old is just amazing to  me.  I really do not see any issues with lymphoma.  I have no problems with her losing weight.  This is a conscientious weight loss.  As always, we will plan to get her back in 6 months.    [name], MD 8/1/20222:02 PM

## 2020-11-23 NOTE — Progress Notes (Signed)
Hematology and Oncology Follow Up Visit  Makayla Fisher [id number] [birth year] 87 y.o. 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  Overall, she is managing.  She is still at [employer].  She tries to keep busy.  She does feel tired all the time.  I can understand this given the fact that she is 87 years old.  She has had no problems with the colostomy.  This seems to be working quite nicely.  She has had no issues with cough or shortness of breath.  She has not had COVID.  There is been no other issues with respect to TIAs.  She is on Lipitor and Plavix.  Her heart rate is a little bit on the low side.  I am not sure as to why this would be.  She is not hypotensive.  She has had no issues with bleeding.  She has had no leg swelling.  There is been no rashes.  Her last iron studies done back in January showed a ferritin of 491 with an iron saturation of 40%.  Overall, I would have to say her performance status is probably ECOG 3.    Medications:  Current Outpatient Medications:    amLODipine (NORVASC) 5 MG tablet, Take 1 tablet (5 mg total) by mouth daily., Disp: 30 tablet, Rfl: 2   aspirin 81 MG EC tablet, Take 1 tablet (81 mg total) by mouth daily. Swallow whole., Disp: 30 tablet, Rfl: 11   atorvastatin (LIPITOR) 40 MG tablet, Take 1 tablet (40 mg total) by mouth at bedtime., Disp: 30 tablet, Rfl: 30   Calcium Carb-Cholecalciferol (CALCIUM 600+D3 PO), Take 1 tablet by mouth at bedtime., Disp: , Rfl:    Cholecalciferol (VITAMIN D3 SUPER STRENGTH) 50 MCG (2000 UT) CAPS, Take 4,000 Units by mouth daily. , Disp: , Rfl:    clopidogrel (PLAVIX) 75 MG tablet, Take 1 tablet (75 mg total) by mouth daily., Disp: 30 tablet,  Rfl: 2   Cranberry 425 MG CAPS, Take 425 mg by mouth daily. , Disp: , Rfl:    erythromycin ophthalmic ointment, SMARTSIG:In Eye(s), Disp: , Rfl:    furosemide (LASIX) 20 MG tablet, Take 1 tablet (20 mg total) by mouth daily., Disp: 30 tablet, Rfl: 3   levothyroxine (SYNTHROID, LEVOTHROID) 50 MCG tablet, Take 50 mcg by mouth daily before breakfast. , Disp: , Rfl:    lidocaine-prilocaine (EMLA) cream, Apply 1 application topically See admin instructions. Apply to port-a-cath one hour prior to procedure, Disp: , Rfl:    Multiple Vitamin (DAILY VITE PO), Take 1 tablet by mouth See admin instructions. Daily at 12 noon, Disp: , Rfl:    Multiple Vitamins-Minerals (PRESERVISION AREDS PO), Take 1 tablet by mouth in the morning and at bedtime., Disp: , Rfl:    nystatin cream (MYCOSTATIN), Apply 1 application topically every 12 (twelve) hours as needed (to rash between the legs and groin)., Disp: , Rfl:    psyllium (METAMUCIL) 58.6 % packet, Take 1 packet by mouth every Monday, Wednesday, and Friday. ORANGE, Disp: , Rfl:    psyllium (METAMUCIL) 58.6 % packet, Take 1 packet by mouth daily as needed (constipation)., Disp: , Rfl:    RESTASIS 0.05 % ophthalmic emulsion, 1 drop 2 (two) times daily., Disp: ,  Rfl:    rOPINIRole (REQUIP) 1 MG tablet, Take 1 mg by mouth at bedtime., Disp: , Rfl:    sertraline (ZOLOFT) 25 MG tablet, Take 25 mg by mouth daily., Disp: , Rfl:    SPIRIVA HANDIHALER 18 MCG inhalation capsule, Place 18 mcg into inhaler and inhale daily. , Disp: , Rfl:    timolol (TIMOPTIC) 0.5 % ophthalmic solution, Place 1 drop into both eyes 2 (two) times daily. Affected eye(s), Disp: , Rfl:    trimethoprim (TRIMPEX) 100 MG tablet, Take 100 mg by mouth daily., Disp: , Rfl:   Allergies:  Allergies  Allergen Reactions   Epipen [Epinephrine Hcl (Nasal)] Palpitations   Levaquin [Levofloxacin In D5w] Other (See Comments)    Weakness- "Allergic," per MAR   Beta Adrenergic Blockers Other (See Comments)     intolerant to higher doses of BB due to bradycardia.   Chlorhexidine Gluconate Other (See Comments)    Pt declined use and suspects she may have an intolerance   Heparin Other (See Comments)    "Alleregic," pre MAR- Maybe affected platelets, per patient   Sulfa Antibiotics Rash    Past Medical History, Surgical history, Social history, and Family History were reviewed and updated.  Review of Systems: Review of Systems  Constitutional: Negative.   HENT: Negative.    Eyes: Negative.   Respiratory:  Positive for shortness of breath.   Cardiovascular:  Positive for palpitations.  Gastrointestinal:  Positive for diarrhea and nausea.  Genitourinary: Negative.   Musculoskeletal:  Positive for joint pain and myalgias.  Skin: Negative.   Neurological:  Positive for tingling.  Endo/Heme/Allergies: Negative.   Psychiatric/Behavioral: Negative.      Physical Exam:  weight is 153 lb (69.4 kg). Her oral temperature is 98.2 F (36.8 C). Her blood pressure is 157/69 (abnormal) and her pulse is 53 (abnormal). Her respiration is 17 and oxygen saturation is 99%.   Physical Exam Vitals reviewed.  HENT:     Head: Normocephalic and atraumatic.  Eyes:     Pupils: Pupils are equal, round, and reactive to light.  Cardiovascular:     Rate and Rhythm: Normal rate and regular rhythm.     Heart sounds: Normal heart sounds.  Pulmonary:     Effort: Pulmonary effort is normal.     Breath sounds: Normal breath sounds.  Abdominal:     General: Bowel sounds are normal.     Palpations: Abdomen is soft.     Comments: Abdominal exam shows a colostomy in the left lower quadrant.  She has multiple surgical scars.  She has no fluid wave.  There is no guarding or rebound tenderness.  There is no palpable liver or spleen tip.  Musculoskeletal:        General: No tenderness or deformity. Normal range of motion.     Cervical back: Normal range of motion.  Lymphadenopathy:     Cervical: No cervical adenopathy.   Skin:    General: Skin is warm and dry.     Findings: No erythema or rash.  Neurological:     Mental Status: She is alert and oriented to person, place, and time.  Psychiatric:        Behavior: Behavior normal.        Thought Content: Thought content normal.        Judgment: Judgment normal.     Lab Results  Component Value Date   WBC 8.6 11/23/2020   HGB 13.6 11/23/2020   HCT 41.6 11/23/2020  MCV 96.3 11/23/2020   PLT 147 (L) 11/23/2020     Chemistry      Component Value Date/Time   NA 139 11/23/2020 1331   NA 143 01/31/2017 0931   NA 141 05/08/2014 1041   K 3.7 11/23/2020 1331   K 3.7 01/31/2017 0931   K 4.5 05/08/2014 1041   CL 103 11/23/2020 1331   CL 106 01/31/2017 0931   CO2 27 11/23/2020 1331   CO2 27 01/31/2017 0931   CO2 24 05/08/2014 1041   BUN 15 11/23/2020 1331   BUN 10 01/31/2017 0931   BUN 14.2 05/08/2014 1041   CREATININE 0.96 11/23/2020 1331   CREATININE 0.9 01/31/2017 0931   CREATININE 0.8 05/08/2014 1041      Component Value Date/Time   CALCIUM 9.7 11/23/2020 1331   CALCIUM 9.7 01/31/2017 0931   CALCIUM 9.2 05/08/2014 1041   ALKPHOS 61 11/23/2020 1331   ALKPHOS 57 01/31/2017 0931   ALKPHOS 66 05/08/2014 1041   AST 19 11/23/2020 1331   AST 23 05/08/2014 1041   ALT 11 11/23/2020 1331   ALT 14 01/31/2017 0931   ALT 10 05/08/2014 1041   BILITOT 0.6 11/23/2020 1331   BILITOT 0.42 05/08/2014 1041      Impression and Plan: Makayla Fisher is 87 year old female. She has a follicular large cell lymphoma.  It has been 4 years since she had completion of her treatment with respect to maintenance Rituxan.  I really think she is cured.  She likes to come back to see [place].  She likes the fact that we do do a thorough exam on her.  We will see what her iron studies look like.  I forgot to mention that she does have some macular degeneration.  It sounds like she is getting some injections into her right eye.  We will plan to get her back in another  6 months.    [name], MD 8/1/20222:16 PM

## 2020-11-23 NOTE — Telephone Encounter (Signed)
Appts made per 11/23/20 los, pt to gain sch at [facility]

## 2020-11-26 LAB — IRON AND TIBC
Iron: 81 ug/dL (ref 41–142)
Saturation Ratios: 36 % (ref 21–57)
TIBC: 228 ug/dL — ABNORMAL LOW (ref 236–444)
UIBC: 146 ug/dL (ref 120–384)

## 2020-11-26 LAB — FERRITIN: Ferritin: 424 ng/mL — ABNORMAL HIGH (ref 11–307)

## 2020-11-26 LAB — TSH: TSH: 4.259 u[IU]/mL — ABNORMAL HIGH (ref 0.308–3.960)

## 2020-11-29 ENCOUNTER — Encounter: Payer: Self-pay | Admitting: Physician Assistant

## 2020-11-29 NOTE — Progress Notes (Addendum)
Cardiology Office Note    Date:  11/30/2020   ID:  Makayla Fisher, DOB Jul 21, 1922, MRN [id number]  PCP:  Makayla Caprice, DO  Cardiologist:  Jenkins Rouge, MD  Electrophysiologist:  None   Chief Complaint: f/u CAD  History of Present Illness:   Makayla Fisher is a 87 y.o. female with history of of follicular large cell non-Hodgkin's lymphoma in remission, HTN, chronic diastolic heart failure, mild to moderate MR, CAD s/p CABG [facility], baseline bradycardia (not on BB due to this), former smoker, GERD, TIA, migraine, iron deficiency anemia, ? HIT (self-reported), HOH, colostomy revision for stricture who presents for post-hospital follow-up.  Per chart review, Makayla Fisher has a history of CAD with remote CABG in 2009 in [place]. She was intolerant to higher doses of BB due to bradycardia. She follows with Dr. Marin Olp for chemo and IV iron for malabsorption. She had memory problems on statin. She unfortunately suffered a stroke-like symptoms in Aug 2021 and received TPA. ASA was switched to Plavix and low dose statin was restarted. Our team last saw her in the hospital March-April 2022 for NSTEMI (troponin around 2k range) in the setting of hypertensive urgency, flash pulmonary edema and diastolic CHF. It was recommended to continue DAPT for 1 year (although as above had been on Plavix PTA). Beta blocker was avoided once again due to bradycardia, also reported isorhythmic dissociation with HR 40s at times but not acutely symptomatic with this. 2D echo as below 07/23/20 EF 50-55%, +WMA, grade 2 DD, mild PASP elevation, mild-moderate MR.  She presents back for routine follow-up and denies any acute cardiac symptoms. No chest pain, SOB at rest, palpitations, dizziness, or syncope. She has had generalized fatigue over the course of the pandemic, complaining that there isn't much for her to do at her facility. She feels deconditioned with regular activity but states this is nothing new.  Overall, no acute changes from prior.  Labwork independently reviewed:  11/2020 TSH 4.259, CMET OK except tprot 6.1, CBC OK except plt 147 06/2020 troponin 2k 11/2019 LDL 136  Past Medical History:  Diagnosis Date   CAD (coronary artery disease)    CABG 2009   Chronic diastolic (congestive) heart failure (HCC)    GERD (gastroesophageal reflux disease)    Heart attack ([place]) 2009   Heparin allergy 07/23/2020   History of blood transfusion 2009   Hypoxia 07/23/2020   Iron deficiency anemia due to chronic blood loss 08/07/2014   Mitral regurgitation    NHL (non-Hodgkin's lymphoma) ([place]) 10/18/2013   finished chemo dec 2015, maintenanance retuxin   Sinus bradycardia    TIA (transient ischemic attack)     Past Surgical History:  Procedure Laterality Date   c section  1947, [facility]  2009   [place]  2014   Colostomy   COLOSTOMY REVISION N/A 06/12/2014   Procedure: COLOSTOMY REVISION;  Surgeon: [name], MD;  Location: [facility];  Service: General;  Laterality: N/A;   CORONARY ANGIOPLASTY WITH STENT PLACEMENT  2009   CORONARY ARTERY BYPASS GRAFT  aug 2009    x3   ESOPHAGOGASTRODUODENOSCOPY  2013   EYE SURGERY Bilateral  10 years ago   lens replacments cataracts   right chest pac  june 2015    Current Medications: Current Meds  Medication Sig   amLODipine (NORVASC) 5 MG tablet Take 1 tablet (5 mg total) by mouth daily.   aspirin 81 MG EC tablet  Take 1 tablet (81 mg total) by mouth daily. Swallow whole.   atorvastatin (LIPITOR) 40 MG tablet Take 1 tablet (40 mg total) by mouth at bedtime.   Calcium Carb-Cholecalciferol (CALCIUM 600+D3 PO) Take 1 tablet by mouth at bedtime.   Cholecalciferol (VITAMIN D3 SUPER STRENGTH) 50 MCG (2000 UT) CAPS Take 4,000 Units by mouth daily.    clopidogrel (PLAVIX) 75 MG tablet Take 1 tablet (75 mg total) by mouth daily.   Cranberry 425 MG CAPS Take 425 mg by mouth daily.    furosemide (LASIX) 20 MG  tablet Take 1 tablet (20 mg total) by mouth daily.   levothyroxine (SYNTHROID, LEVOTHROID) 50 MCG tablet Take 50 mcg by mouth daily before breakfast.    lidocaine-prilocaine (EMLA) cream Apply 1 application topically See admin instructions. Apply to port-a-cath one hour prior to procedure   Multiple Vitamin (DAILY VITE PO) Take 1 tablet by mouth See admin instructions. Daily at 12 noon   Multiple Vitamins-Minerals (PRESERVISION AREDS PO) Take 1 tablet by mouth in the morning and at bedtime.   nystatin cream (MYCOSTATIN) Apply 1 application topically every 12 (twelve) hours as needed (to rash between the legs and groin).   psyllium (METAMUCIL) 58.6 % packet Take 1 packet by mouth every Monday, Wednesday, and Friday. ORANGE   rOPINIRole (REQUIP) 1 MG tablet Take 1 mg by mouth at bedtime.   sertraline (ZOLOFT) 25 MG tablet Take 25 mg by mouth daily.   SPIRIVA HANDIHALER 18 MCG inhalation capsule Place 18 mcg into inhaler and inhale daily.    timolol (TIMOPTIC) 0.5 % ophthalmic solution Place 1 drop into both eyes 2 (two) times daily. Affected eye(s)   trimethoprim (TRIMPEX) 100 MG tablet Take 100 mg by mouth daily.      Allergies:   Epipen [epinephrine hcl (nasal)], Levaquin [levofloxacin in d5w], Beta adrenergic blockers, Chlorhexidine gluconate, Heparin, and Sulfa antibiotics   Social History   Socioeconomic History   Marital status: Widowed    Spouse name: Not on file   Number of children: Not on file   Years of education: Not on file   Highest education level: Not on file  Occupational History   Not on file  Tobacco Use   Smoking status: Former    Packs/day: 1.00    Years: 25.00    Pack years: 25.00    Types: Cigarettes    Start date: 05/16/1948    Quit date: 05/17/1963    Years since quitting: 57.5   Smokeless tobacco: Never   Tobacco comments:    quit smoking 50 years ago  Vaping Use   Vaping Use: Never used  Substance and Sexual Activity   Alcohol use: No    Alcohol/week:  0.0 standard drinks   Drug use: No   Sexual activity: Not on file  Other Topics Concern   Not on file  Social History Narrative   Not on file   Social Determinants of Health   Financial Resource Strain: Not on file  Food Insecurity: Not on file  Transportation Needs: Not on file  Physical Activity: Not on file  Stress: Not on file  Social Connections: Not on file     Family History:  The patient's family history includes Diverticulitis in her mother; Heart attack in her mother; Ulcers in her father.  ROS:   Please see the history of present illness.  All other systems are reviewed and otherwise negative.    EKGs/Labs/Other Studies Reviewed:    Studies reviewed are outlined and  summarized above. Reports included below if pertinent.  Echo 06/2020 IMPRESSIONS     1. Left ventricular ejection fraction, by estimation, is 50 to 55%. The  left ventricle has low normal function. The left ventricle demonstrates  regional wall motion abnormalities (see scoring diagram/findings for  description). Left ventricular diastolic   parameters are consistent with Grade II diastolic dysfunction  (pseudonormalization). Elevated left ventricular end-diastolic pressure.  There is mild hypokinesis of the left ventricular, mid-apical inferior  wall, inferoseptal wall and anteroseptal wall.   2. Right ventricular systolic function is normal. The right ventricular  size is normal. There is mildly elevated pulmonary artery systolic  pressure.   3. Left atrial size was severely dilated.   4. Right atrial size was mild to moderately dilated.   5. The mitral valve is degenerative. Mild to moderate mitral valve  regurgitation. Moderate mitral annular calcification.   6. The aortic valve is grossly normal. There is mild calcification of the  aortic valve. There is mild thickening of the aortic valve. Aortic valve  regurgitation is not visualized. Mild aortic valve sclerosis is present,  with no  evidence of aortic valve  stenosis.   7. The inferior vena cava is normal in size with greater than 50%  respiratory variability, suggesting right atrial pressure of 3 mmHg.   Comparison(s): Changes from prior study are noted.   Conclusion(s)/Recommendation(s): Focal wall motion abnormalities noted,  with low normal EF and no severe valve disease.    Carotid duplex 04/2017    Final Interpretation:  Right Carotid: There is evidence in the right ICA of a 1-39% stenosis.   Left Carotid: There is evidence in the left ICA of a 1-39% stenosis.   Vertebrals: Both vertebral arteries were patent with antegrade flow.   *See table(s) above for measurements and observations.      Electronically signed by Antony Contras on 05/01/2017 at 4:02:55 PM.     EKG:  EKG is ordered today, personally reviewed, demonstrating largely SB 50bpm, nonspecific TW changes, nonacute (3rd beat appears possibly junctional, 2nd to last beat has subtly different P wave appearance) - reviewed with Dr. Tamala Julian, suspected sinus bradycardia with PJCs  Recent Labs: 07/23/2020: B Natriuretic Peptide 517.5 11/23/2020: ALT 11; BUN 15; Creatinine 0.96; Hemoglobin 13.6; Platelet Count 147; Potassium 3.7; Sodium 139; TSH 4.259  Recent Lipid Panel    Component Value Date/Time   CHOL 213 (H) 11/27/2019 0547   TRIG 97 11/27/2019 0547   HDL 58 11/27/2019 0547   CHOLHDL 3.7 11/27/2019 0547   VLDL 19 11/27/2019 0547   LDLCALC 136 (H) 11/27/2019 0547    PHYSICAL EXAM:    VS:  BP 140/62   Pulse (!) 50   Ht '5\' 1"'$  (1.549 m)   Wt 154 lb 3.2 oz (69.9 kg)   SpO2 97%   BMI 29.14 kg/m   BMI: Body mass index is 29.14 kg/m.  GEN: Well nourished, well developed elderly female in no acute distress HEENT: normocephalic, atraumatic Neck: no JVD, carotid bruits, or masses Cardiac: regular rhythm low-normal HR; no murmurs, rubs, or gallops, no edema  Respiratory:  clear to auscultation bilaterally, normal work of breathing GI: soft,  nontender, nondistended, + BS MS: + kyphotic posture, no atrophy Skin: warm and dry, no rash Neuro:  Alert and Oriented x 3, Strength and sensation are intact, follows commands, HOH Psych: euthymic mood, full affect  Wt Readings from Last 3 Encounters:  11/30/20 154 lb 3.2 oz (69.9 kg)  11/23/20 153 lb (  69.4 kg)  07/27/20 161 lb 2.5 oz (73.1 kg)     ASSESSMENT & PLAN:   CAD - doing well without recent angina. As previously stated above, recommended to continue DAPT for medical management of NSTEMI (occurred March/April 2022). She was already on Plavix prior to admission due to CVA so will need review of aspirin plan in next follow-up. Avoid BB due to baseline bradycardia. Continue statin as tolerated.  Chronic diastolic CHF - appears euvolemic. Taking Lasix, tolerating well. Recent CMET 11/23/20 showed stable labs. Would not be more aggressive with BP due to tendency for baseline low HR.  Sinus bradycardia - this is a chronic issue for her; isorhythmic dissociation has been noted as well. She has not had any higher risk features to suggest acute worsening so would continue to manage conservatively as we are doing in light of advanced age and comorbidities. Generalized fatigue is likely multifactorial but we did discuss warning symptoms that would warrant re-evaluation. HR was 50s through the entire visit today. Of note, TSH was recently high so will recheck today with free T4 and total T3. Avoid AVN blocking agents going forward as well.  Mitral regurgitation - mild-moderate by echo earlier this year. Would continue to manage conservatively in the absence of active heart failure symptoms. Will defer to primary cardiologist the timing of follow-up echocardiogram but makes sense to follow with symptom-based approach at this time.  Disposition: F/u with Dr. Johnsie Cancel in 4 months.  Medication Adjustments/Labs and Tests Ordered: Current medicines are reviewed at length with the patient today.   Concerns regarding medicines are outlined above. Medication changes, Labs and Tests ordered today are summarized above and listed in the Patient Instructions accessible in Encounters.   Signed, Charlie Pitter, PA-C  11/30/2020 3:12 PM    [facility] [address], [place], [place]  [address] Phone: [phone]; Fax: [phone]

## 2020-11-30 ENCOUNTER — Ambulatory Visit (INDEPENDENT_AMBULATORY_CARE_PROVIDER_SITE_OTHER): Payer: Medicare Other | Admitting: Physician Assistant

## 2020-11-30 ENCOUNTER — Encounter: Payer: Self-pay | Admitting: Physician Assistant

## 2020-11-30 ENCOUNTER — Other Ambulatory Visit: Payer: Self-pay

## 2020-11-30 VITALS — BP 140/62 | HR 50 | Ht 61.0 in | Wt 154.2 lb

## 2020-11-30 DIAGNOSIS — I5032 Chronic diastolic (congestive) heart failure: Secondary | ICD-10-CM

## 2020-11-30 DIAGNOSIS — I340 Nonrheumatic mitral (valve) insufficiency: Secondary | ICD-10-CM

## 2020-11-30 DIAGNOSIS — I2510 Atherosclerotic heart disease of native coronary artery without angina pectoris: Secondary | ICD-10-CM | POA: Diagnosis not present

## 2020-11-30 DIAGNOSIS — R001 Bradycardia, unspecified: Secondary | ICD-10-CM | POA: Diagnosis not present

## 2020-11-30 NOTE — Patient Instructions (Signed)
Medication Instructions:  Your physician recommends that you continue on your current medications as directed. Please refer to the Current Medication list given to you today.  *If you need a refill on your cardiac medications before your next appointment, please call your pharmacy*   Lab Work: TODAY: TSH, T3, & FT3 3 If you have labs (blood work) drawn today and your tests are completely normal, you will receive your results only by: [place] (if you have MyChart) OR A paper copy in the mail If you have any lab test that is abnormal or we need to change your treatment, we will call you to review the results.   Testing/Procedures: None ordered   Follow-Up: At [facility], you and your health needs are our priority.  As part of our continuing mission to provide you with exceptional heart care, we have created designated Provider Care Teams.  These Care Teams include your primary Cardiologist (physician) and Advanced Practice Providers (APPs -  Physician Assistants and Nurse Practitioners) who all work together to provide you with the care you need, when you need it.  We recommend signing up for the patient portal called "MyChart".  Sign up information is provided on this After Visit Summary.  MyChart is used to connect with patients for Virtual Visits (Telemedicine).  Patients are able to view lab/test results, encounter notes, upcoming appointments, etc.  Non-urgent messages can be sent to your provider as well.   To learn more about what you can do with MyChart, go to [email or web address].    Your next appointment:   4 month(s)  05/21/2020 arrive at 9:45  The format for your next appointment:   In Person  Provider:   Jenkins Rouge, MD   Other Instructions

## 2020-12-01 LAB — T4, FREE: Free T4: 1.35 ng/dL (ref 0.82–1.77)

## 2020-12-01 LAB — T3: T3, Total: 90 ng/dL (ref 71–180)

## 2020-12-01 LAB — TSH: TSH: 5.36 u[IU]/mL — ABNORMAL HIGH (ref 0.450–4.500)

## 2020-12-04 ENCOUNTER — Telehealth: Payer: Self-pay | Admitting: Physician Assistant

## 2020-12-04 NOTE — Telephone Encounter (Signed)
I reviewed results with the patient.  She said she does not have PCP.  She is seen at [facility].  She requested I send a copy of the results to [facility] as well as to her personally.   Copies of labs placed in outoging mail.

## 2020-12-04 NOTE — Telephone Encounter (Signed)
    Pt is returning call to get lab result, she said if she not able to answer the call to leave her a detailed message

## 2020-12-04 NOTE — Progress Notes (Signed)
I reviewed results with the patient.  She said she does not have PCP.  She is seen at [facility].  She requested I send a copy of the results to [facility] as well as to her personally.   Copies of labs placed in outoging mail.

## 2020-12-31 ENCOUNTER — Ambulatory Visit: Payer: Medicare Other | Admitting: Adult Health

## 2021-01-21 ENCOUNTER — Inpatient Hospital Stay: Payer: Medicare Other | Attending: Hematology & Oncology

## 2021-01-21 ENCOUNTER — Other Ambulatory Visit: Payer: Self-pay

## 2021-01-21 DIAGNOSIS — Z452 Encounter for adjustment and management of vascular access device: Secondary | ICD-10-CM | POA: Insufficient documentation

## 2021-01-21 DIAGNOSIS — D508 Other iron deficiency anemias: Secondary | ICD-10-CM | POA: Insufficient documentation

## 2021-01-21 DIAGNOSIS — Z8572 Personal history of non-Hodgkin lymphomas: Secondary | ICD-10-CM | POA: Insufficient documentation

## 2021-01-21 NOTE — Patient Instructions (Signed)

## 2021-03-25 ENCOUNTER — Inpatient Hospital Stay: Payer: Medicare Other | Attending: Hematology & Oncology

## 2021-03-25 ENCOUNTER — Other Ambulatory Visit: Payer: Self-pay

## 2021-03-25 VITALS — BP 144/63 | HR 54 | Temp 98.0°F | Resp 17

## 2021-03-25 DIAGNOSIS — Z8572 Personal history of non-Hodgkin lymphomas: Secondary | ICD-10-CM

## 2021-03-25 DIAGNOSIS — Z9049 Acquired absence of other specified parts of digestive tract: Secondary | ICD-10-CM

## 2021-03-25 DIAGNOSIS — Z7982 Long term (current) use of aspirin: Secondary | ICD-10-CM

## 2021-03-25 DIAGNOSIS — K219 Gastro-esophageal reflux disease without esophagitis: Secondary | ICD-10-CM | POA: Diagnosis present

## 2021-03-25 DIAGNOSIS — J432 Centrilobular emphysema: Secondary | ICD-10-CM | POA: Diagnosis present

## 2021-03-25 DIAGNOSIS — Z6829 Body mass index (BMI) 29.0-29.9, adult: Secondary | ICD-10-CM

## 2021-03-25 DIAGNOSIS — I252 Old myocardial infarction: Secondary | ICD-10-CM

## 2021-03-25 DIAGNOSIS — F419 Anxiety disorder, unspecified: Secondary | ICD-10-CM | POA: Diagnosis present

## 2021-03-25 DIAGNOSIS — Z20822 Contact with and (suspected) exposure to covid-19: Secondary | ICD-10-CM | POA: Diagnosis present

## 2021-03-25 DIAGNOSIS — I110 Hypertensive heart disease with heart failure: Principal | ICD-10-CM | POA: Diagnosis present

## 2021-03-25 DIAGNOSIS — Z79890 Hormone replacement therapy (postmenopausal): Secondary | ICD-10-CM

## 2021-03-25 DIAGNOSIS — J441 Chronic obstructive pulmonary disease with (acute) exacerbation: Secondary | ICD-10-CM | POA: Diagnosis not present

## 2021-03-25 DIAGNOSIS — E669 Obesity, unspecified: Secondary | ICD-10-CM | POA: Diagnosis present

## 2021-03-25 DIAGNOSIS — E039 Hypothyroidism, unspecified: Secondary | ICD-10-CM | POA: Diagnosis present

## 2021-03-25 DIAGNOSIS — Z87891 Personal history of nicotine dependence: Secondary | ICD-10-CM

## 2021-03-25 DIAGNOSIS — Z7902 Long term (current) use of antithrombotics/antiplatelets: Secondary | ICD-10-CM

## 2021-03-25 DIAGNOSIS — Z8673 Personal history of transient ischemic attack (TIA), and cerebral infarction without residual deficits: Secondary | ICD-10-CM

## 2021-03-25 DIAGNOSIS — Z955 Presence of coronary angioplasty implant and graft: Secondary | ICD-10-CM

## 2021-03-25 DIAGNOSIS — Z888 Allergy status to other drugs, medicaments and biological substances status: Secondary | ICD-10-CM

## 2021-03-25 DIAGNOSIS — D696 Thrombocytopenia, unspecified: Secondary | ICD-10-CM | POA: Diagnosis present

## 2021-03-25 DIAGNOSIS — Z8249 Family history of ischemic heart disease and other diseases of the circulatory system: Secondary | ICD-10-CM

## 2021-03-25 DIAGNOSIS — Z933 Colostomy status: Secondary | ICD-10-CM

## 2021-03-25 DIAGNOSIS — Z79899 Other long term (current) drug therapy: Secondary | ICD-10-CM

## 2021-03-25 DIAGNOSIS — Z882 Allergy status to sulfonamides status: Secondary | ICD-10-CM

## 2021-03-25 DIAGNOSIS — Z95828 Presence of other vascular implants and grafts: Secondary | ICD-10-CM

## 2021-03-25 DIAGNOSIS — I5032 Chronic diastolic (congestive) heart failure: Secondary | ICD-10-CM | POA: Diagnosis present

## 2021-03-25 DIAGNOSIS — Z951 Presence of aortocoronary bypass graft: Secondary | ICD-10-CM

## 2021-03-25 DIAGNOSIS — I2510 Atherosclerotic heart disease of native coronary artery without angina pectoris: Secondary | ICD-10-CM | POA: Diagnosis present

## 2021-03-25 DIAGNOSIS — J9601 Acute respiratory failure with hypoxia: Secondary | ICD-10-CM | POA: Diagnosis present

## 2021-03-25 MED ORDER — SODIUM CHLORIDE 0.9% FLUSH
10.0000 mL | Freq: Once | INTRAVENOUS | Status: AC
  Administered 2021-03-25: 10 mL via INTRAVENOUS

## 2021-03-25 MED ORDER — HEPARIN SOD (PORK) LOCK FLUSH 100 UNIT/ML IV SOLN
500.0000 [IU] | Freq: Once | INTRAVENOUS | Status: AC
  Administered 2021-03-25: 500 [IU] via INTRAVENOUS

## 2021-03-25 NOTE — Patient Instructions (Signed)

## 2021-03-26 ENCOUNTER — Observation Stay (HOSPITAL_COMMUNITY): Payer: Medicare Other

## 2021-03-26 ENCOUNTER — Emergency Department (HOSPITAL_COMMUNITY): Payer: Medicare Other

## 2021-03-26 ENCOUNTER — Inpatient Hospital Stay (HOSPITAL_COMMUNITY)
Admission: EM | Admit: 2021-03-26 | Discharge: 2021-03-30 | DRG: 291 | Disposition: A | Discharge status: Skilled Nursing Facility | Payer: Medicare Other | Attending: Student in an Organized Health Care Education/Training Program | Admitting: Student in an Organized Health Care Education/Training Program

## 2021-03-26 ENCOUNTER — Other Ambulatory Visit: Payer: Self-pay

## 2021-03-26 DIAGNOSIS — I110 Hypertensive heart disease with heart failure: Principal | ICD-10-CM | POA: Diagnosis present

## 2021-03-26 DIAGNOSIS — Z79890 Hormone replacement therapy (postmenopausal): Secondary | ICD-10-CM

## 2021-03-26 DIAGNOSIS — Z7982 Long term (current) use of aspirin: Secondary | ICD-10-CM

## 2021-03-26 DIAGNOSIS — Z955 Presence of coronary angioplasty implant and graft: Secondary | ICD-10-CM

## 2021-03-26 DIAGNOSIS — I2510 Atherosclerotic heart disease of native coronary artery without angina pectoris: Secondary | ICD-10-CM | POA: Diagnosis present

## 2021-03-26 DIAGNOSIS — Z951 Presence of aortocoronary bypass graft: Secondary | ICD-10-CM

## 2021-03-26 DIAGNOSIS — Z9049 Acquired absence of other specified parts of digestive tract: Secondary | ICD-10-CM

## 2021-03-26 DIAGNOSIS — Z888 Allergy status to other drugs, medicaments and biological substances status: Secondary | ICD-10-CM

## 2021-03-26 DIAGNOSIS — E669 Obesity, unspecified: Secondary | ICD-10-CM | POA: Diagnosis present

## 2021-03-26 DIAGNOSIS — Z9981 Dependence on supplemental oxygen: Secondary | ICD-10-CM | POA: Diagnosis present

## 2021-03-26 DIAGNOSIS — Z8673 Personal history of transient ischemic attack (TIA), and cerebral infarction without residual deficits: Secondary | ICD-10-CM

## 2021-03-26 DIAGNOSIS — J441 Chronic obstructive pulmonary disease with (acute) exacerbation: Secondary | ICD-10-CM

## 2021-03-26 DIAGNOSIS — Z6829 Body mass index (BMI) 29.0-29.9, adult: Secondary | ICD-10-CM

## 2021-03-26 DIAGNOSIS — Z7902 Long term (current) use of antithrombotics/antiplatelets: Secondary | ICD-10-CM

## 2021-03-26 DIAGNOSIS — Z79899 Other long term (current) drug therapy: Secondary | ICD-10-CM

## 2021-03-26 DIAGNOSIS — J9601 Acute respiratory failure with hypoxia: Secondary | ICD-10-CM | POA: Diagnosis not present

## 2021-03-26 DIAGNOSIS — I5032 Chronic diastolic (congestive) heart failure: Secondary | ICD-10-CM | POA: Diagnosis present

## 2021-03-26 DIAGNOSIS — K219 Gastro-esophageal reflux disease without esophagitis: Secondary | ICD-10-CM | POA: Diagnosis present

## 2021-03-26 DIAGNOSIS — Z8572 Personal history of non-Hodgkin lymphomas: Secondary | ICD-10-CM

## 2021-03-26 DIAGNOSIS — Z882 Allergy status to sulfonamides status: Secondary | ICD-10-CM

## 2021-03-26 DIAGNOSIS — Z20822 Contact with and (suspected) exposure to covid-19: Secondary | ICD-10-CM | POA: Diagnosis present

## 2021-03-26 DIAGNOSIS — J439 Emphysema, unspecified: Secondary | ICD-10-CM | POA: Diagnosis present

## 2021-03-26 DIAGNOSIS — E039 Hypothyroidism, unspecified: Secondary | ICD-10-CM | POA: Diagnosis present

## 2021-03-26 DIAGNOSIS — Z933 Colostomy status: Secondary | ICD-10-CM

## 2021-03-26 DIAGNOSIS — Z8249 Family history of ischemic heart disease and other diseases of the circulatory system: Secondary | ICD-10-CM

## 2021-03-26 DIAGNOSIS — D696 Thrombocytopenia, unspecified: Secondary | ICD-10-CM | POA: Diagnosis present

## 2021-03-26 DIAGNOSIS — Z87891 Personal history of nicotine dependence: Secondary | ICD-10-CM

## 2021-03-26 DIAGNOSIS — J432 Centrilobular emphysema: Secondary | ICD-10-CM | POA: Diagnosis present

## 2021-03-26 DIAGNOSIS — F419 Anxiety disorder, unspecified: Secondary | ICD-10-CM | POA: Diagnosis present

## 2021-03-26 DIAGNOSIS — I252 Old myocardial infarction: Secondary | ICD-10-CM

## 2021-03-26 LAB — CBC WITH DIFFERENTIAL/PLATELET
Abs Immature Granulocytes: 0.04 10*3/uL (ref 0.00–0.07)
Basophils Absolute: 0 10*3/uL (ref 0.0–0.1)
Basophils Relative: 0 %
Eosinophils Absolute: 0.2 10*3/uL (ref 0.0–0.5)
Eosinophils Relative: 2 %
HCT: 41.7 % (ref 36.0–46.0)
Hemoglobin: 13.1 g/dL (ref 12.0–15.0)
Immature Granulocytes: 0 %
Lymphocytes Relative: 17 %
Lymphs Abs: 1.9 10*3/uL (ref 0.7–4.0)
MCH: 31.4 pg (ref 26.0–34.0)
MCHC: 31.4 g/dL (ref 30.0–36.0)
MCV: 100 fL (ref 80.0–100.0)
Monocytes Absolute: 0.7 10*3/uL (ref 0.1–1.0)
Monocytes Relative: 7 %
Neutro Abs: 8.2 10*3/uL — ABNORMAL HIGH (ref 1.7–7.7)
Neutrophils Relative %: 74 %
Platelets: 126 10*3/uL — ABNORMAL LOW (ref 150–400)
RBC: 4.17 MIL/uL (ref 3.87–5.11)
RDW: 14.2 % (ref 11.5–15.5)
WBC: 11.1 10*3/uL — ABNORMAL HIGH (ref 4.0–10.5)
nRBC: 0 % (ref 0.0–0.2)

## 2021-03-26 LAB — RESPIRATORY PANEL BY PCR

## 2021-03-26 LAB — BRAIN NATRIURETIC PEPTIDE: B Natriuretic Peptide: 198.4 pg/mL — ABNORMAL HIGH (ref 0.0–100.0)

## 2021-03-26 LAB — BASIC METABOLIC PANEL
Anion gap: 8 (ref 5–15)
BUN: 20 mg/dL (ref 8–23)
CO2: 26 mmol/L (ref 22–32)
Calcium: 9.2 mg/dL (ref 8.9–10.3)
Chloride: 105 mmol/L (ref 98–111)
Creatinine, Ser: 1.09 mg/dL — ABNORMAL HIGH (ref 0.44–1.00)
GFR, Estimated: 47 mL/min — ABNORMAL LOW (ref >60)
Glucose, Bld: 128 mg/dL — ABNORMAL HIGH (ref 70–99)
Potassium: 3.7 mmol/L (ref 3.5–5.1)
Sodium: 139 mmol/L (ref 135–145)

## 2021-03-26 LAB — RESP PANEL BY RT-PCR (FLU A&B, COVID) ARPGX2
Influenza A by PCR: NEGATIVE
Influenza B by PCR: NEGATIVE
SARS Coronavirus 2 by RT PCR: NEGATIVE

## 2021-03-26 LAB — TROPONIN I (HIGH SENSITIVITY)
Troponin I (High Sensitivity): 38 ng/L — ABNORMAL HIGH (ref <18)
Troponin I (High Sensitivity): 72 ng/L — ABNORMAL HIGH (ref <18)

## 2021-03-26 MED ORDER — ALBUTEROL SULFATE (2.5 MG/3ML) 0.083% IN NEBU
5.0000 mg | INHALATION_SOLUTION | Freq: Once | RESPIRATORY_TRACT | Status: AC
  Administered 2021-03-26: 5 mg via RESPIRATORY_TRACT
  Filled 2021-03-26: qty 6

## 2021-03-26 MED ORDER — PREDNISONE 20 MG PO TABS
60.0000 mg | ORAL_TABLET | Freq: Once | ORAL | Status: AC
  Administered 2021-03-26: 60 mg via ORAL
  Filled 2021-03-26: qty 3

## 2021-03-26 MED ORDER — ADULT MULTIVITAMIN W/MINERALS CH
1.0000 | ORAL_TABLET | Freq: Every day | ORAL | Status: DC
  Administered 2021-03-26 – 2021-03-29 (×4): 1 via ORAL
  Filled 2021-03-26 (×4): qty 1

## 2021-03-26 MED ORDER — SODIUM CHLORIDE 0.9% FLUSH
10.0000 mL | Freq: Two times a day (BID) | INTRAVENOUS | Status: DC
  Administered 2021-03-26 – 2021-03-29 (×6): 10 mL

## 2021-03-26 MED ORDER — SODIUM CHLORIDE 0.9 % IV SOLN
1.0000 g | Freq: Once | INTRAVENOUS | Status: AC
  Administered 2021-03-26: 1 g via INTRAVENOUS
  Filled 2021-03-26: qty 10

## 2021-03-26 MED ORDER — SERTRALINE HCL 50 MG PO TABS
25.0000 mg | ORAL_TABLET | Freq: Every day | ORAL | Status: DC
  Administered 2021-03-26 – 2021-03-29 (×4): 25 mg via ORAL
  Filled 2021-03-26 (×4): qty 1

## 2021-03-26 MED ORDER — ENOXAPARIN SODIUM 40 MG/0.4ML IJ SOSY: 40.0000 mg | PREFILLED_SYRINGE | INTRAMUSCULAR | Status: DC

## 2021-03-26 MED ORDER — AMLODIPINE BESYLATE 5 MG PO TABS
5.0000 mg | ORAL_TABLET | Freq: Every day | ORAL | Status: DC
  Administered 2021-03-26 – 2021-03-29 (×4): 5 mg via ORAL
  Filled 2021-03-26 (×4): qty 1

## 2021-03-26 MED ORDER — ASPIRIN EC 81 MG PO TBEC
81.0000 mg | DELAYED_RELEASE_TABLET | Freq: Every day | ORAL | Status: DC
  Administered 2021-03-26 – 2021-03-29 (×4): 81 mg via ORAL
  Filled 2021-03-26 (×4): qty 1

## 2021-03-26 MED ORDER — UMECLIDINIUM BROMIDE 62.5 MCG/ACT IN AEPB
1.0000 | INHALATION_SPRAY | Freq: Every day | RESPIRATORY_TRACT | Status: DC
  Filled 2021-03-26: qty 7

## 2021-03-26 MED ORDER — ACETAMINOPHEN 650 MG RE SUPP: 650.0000 mg | Freq: Four times a day (QID) | RECTAL | Status: DC | PRN

## 2021-03-26 MED ORDER — ACETAMINOPHEN 325 MG PO TABS
650.0000 mg | ORAL_TABLET | Freq: Four times a day (QID) | ORAL | Status: DC | PRN
  Administered 2021-03-28 (×2): 650 mg via ORAL
  Filled 2021-03-26 (×2): qty 2

## 2021-03-26 MED ORDER — SODIUM CHLORIDE 0.9 % IV SOLN
500.0000 mg | Freq: Once | INTRAVENOUS | Status: AC
  Administered 2021-03-26: 500 mg via INTRAVENOUS
  Filled 2021-03-26: qty 500

## 2021-03-26 MED ORDER — IPRATROPIUM BROMIDE 0.02 % IN SOLN
0.5000 mg | Freq: Once | RESPIRATORY_TRACT | Status: AC
  Administered 2021-03-26: 0.5 mg via RESPIRATORY_TRACT
  Filled 2021-03-26: qty 2.5

## 2021-03-26 MED ORDER — CLOPIDOGREL BISULFATE 75 MG PO TABS
75.0000 mg | ORAL_TABLET | Freq: Every day | ORAL | Status: DC
  Administered 2021-03-26 – 2021-03-29 (×4): 75 mg via ORAL
  Filled 2021-03-26 (×4): qty 1

## 2021-03-26 MED ORDER — IPRATROPIUM-ALBUTEROL 0.5-2.5 (3) MG/3ML IN SOLN
3.0000 mL | RESPIRATORY_TRACT | Status: AC
  Administered 2021-03-26 – 2021-03-27 (×5): 3 mL via RESPIRATORY_TRACT
  Filled 2021-03-26 (×5): qty 3

## 2021-03-26 MED ORDER — PSYLLIUM 95 % PO PACK
1.0000 | PACK | ORAL | Status: DC
  Administered 2021-03-27 – 2021-03-28 (×2): 1 via ORAL
  Filled 2021-03-26 (×4): qty 1

## 2021-03-26 MED ORDER — ATORVASTATIN CALCIUM 40 MG PO TABS
40.0000 mg | ORAL_TABLET | Freq: Every day | ORAL | Status: DC
  Administered 2021-03-26 – 2021-03-29 (×4): 40 mg via ORAL
  Filled 2021-03-26 (×4): qty 1

## 2021-03-26 MED ORDER — TIMOLOL MALEATE 0.5 % OP SOLN
1.0000 [drp] | Freq: Two times a day (BID) | OPHTHALMIC | Status: DC
  Administered 2021-03-26 – 2021-03-29 (×8): 1 [drp] via OPHTHALMIC
  Filled 2021-03-26: qty 5

## 2021-03-26 MED ORDER — UMECLIDINIUM BROMIDE 62.5 MCG/ACT IN AEPB
1.0000 | INHALATION_SPRAY | Freq: Every day | RESPIRATORY_TRACT | Status: DC
  Administered 2021-03-28: 09:00:00 1 via RESPIRATORY_TRACT
  Filled 2021-03-26: qty 7

## 2021-03-26 MED ORDER — PREDNISONE 20 MG PO TABS
40.0000 mg | ORAL_TABLET | Freq: Every day | ORAL | Status: DC
  Administered 2021-03-27 – 2021-03-29 (×3): 40 mg via ORAL
  Filled 2021-03-26 (×3): qty 2

## 2021-03-26 MED ORDER — FONDAPARINUX SODIUM 2.5 MG/0.5ML ~~LOC~~ SOLN
2.5000 mg | SUBCUTANEOUS | Status: DC
  Administered 2021-03-26 – 2021-03-29 (×4): 2.5 mg via SUBCUTANEOUS
  Filled 2021-03-26 (×4): qty 0.5

## 2021-03-26 MED ORDER — ALBUTEROL SULFATE (2.5 MG/3ML) 0.083% IN NEBU
10.0000 mg/h | INHALATION_SOLUTION | Freq: Once | RESPIRATORY_TRACT | Status: AC
  Administered 2021-03-26: 10 mg/h via RESPIRATORY_TRACT
  Filled 2021-03-26: qty 3

## 2021-03-26 MED ORDER — ROPINIROLE HCL 1 MG PO TABS
1.0000 mg | ORAL_TABLET | Freq: Every day | ORAL | Status: DC
  Administered 2021-03-26 – 2021-03-29 (×4): 1 mg via ORAL
  Filled 2021-03-26 (×4): qty 1

## 2021-03-26 MED ORDER — LEVOTHYROXINE SODIUM 50 MCG PO TABS
50.0000 ug | ORAL_TABLET | Freq: Every day | ORAL | Status: DC
  Administered 2021-03-27 – 2021-03-29 (×3): 50 ug via ORAL
  Filled 2021-03-26 (×3): qty 1

## 2021-03-26 NOTE — ED Notes (Signed)
Provider at bedside

## 2021-03-26 NOTE — ED Provider Notes (Signed)
I received the patient in signout from Dr. Christy Gentles, briefly the patient is a 87 year old female that lives independently came in with a chief complaint of shortness of breath.  Requiring some oxygen support initially.  Thought to be COPD exacerbation.  Plan to give nebs check lab work reassess.  Patient feeling a bit better on reassessment.  She was taken off of oxygen and did okay in the bed but with minimal exertion became quite short of breath and oxygen dropped into the mid to upper 80s.  Quite a bit tachypneic on return.  Will start on a continuous.  Will discuss with medicine for admission.  CRITICAL CARE Performed by: Cecilio Asper   Total critical care time: 35 minutes  Critical care time was exclusive of separately billable procedures and treating other patients.  Critical care was necessary to treat or prevent imminent or life-threatening deterioration.  Critical care was time spent personally by me on the following activities: development of treatment plan with patient and/or surrogate as well as nursing, discussions with consultants, evaluation of patient's response to treatment, examination of patient, obtaining history from patient or surrogate, ordering and performing treatments and interventions, ordering and review of laboratory studies, ordering and review of radiographic studies, pulse oximetry and re-evaluation of patient's condition.      Deno Etienne, DO 03/26/21 [phone]

## 2021-03-26 NOTE — ED Notes (Addendum)
Ambulated patient to bedside commode on room air. Patient's O2 dropped to 88% while ambulating and patient was short of breath. Placed patient back in bed and back on 2L of O2. Saturation currently 98% on 2l.

## 2021-03-26 NOTE — ED Notes (Signed)
Patient transported to CT 

## 2021-03-26 NOTE — H&P (Addendum)
Date: 03/26/2021               Patient Name:  Makayla Fisher MRN: [id number]  DOB: March 29, 1923 Age / Sex: 87 y.o., female   PCP: [name], DO         Medical Service: Internal Medicine Teaching Service         Attending Physician: Dr. Evette Doffing, Mallie Mussel, *    First Contact: Dr. Ileene Musa Pager: [phone]  Second Contact: Dr. Lisabeth Devoid Pager: [phone]       After Hours (After 5p/  First Contact Pager: [phone]  weekends / holidays): Second Contact Pager: [phone]   Chief Complaint: SOB  History of Present Illness:   [name] is a 87 year old female with a past medical history of follicular large cell lymphoma s/p Rituxan in remission, CAD s/p CABG (2009), sinus bradycardia, hypertension, HFpEF, TIA, colon perforation s/p colostomy (2014) presenting to the ED with complaints of shortness of breath.  [name] describes shortness of breath that began rather quickly yesterday afternoon.  It began around the time she was eating an [place] sub.  Her symptoms progressively worsened throughout the rest of the day and into the night.  She describes increased work of breathing.  She was able to lay down and sleep for few hours, but when she woke up she was short of breath.  She denies any shortness of breath when laying down though.  Other than eating an [place] sub (she typically avoids salty foods), there has been no change in diet or medications.    She denies any associated fever, chills, sore throat, rhinorrhea, congestion, chest pain, palpitations, nausea, vomiting, diarrhea, or peripheral edema.  She denies any prior history of COPD or pulmonary disease.  ED course: On arrival to the ED, patient's blood pressure was 139/72 with heart rate of 92.  She was saturating at 99% on 3 L nasal cannula with a respiratory rate of 23.  She was afebrile at 97.4.  Chest x-ray was obtained that showed chronic pulmonary hyperinflation with a small right pleural effusion.  Initial blood  work remarkable for WBC of 11.9, platelets of 126, and creatinine of 1.09 with GFR 47.  COVID-19 and influenza negative.  Patient was treated with albuterol, azithromycin ceftriaxone, and prednisone for presumed COPD exacerbation.  Due to persistent oxygen requirement with exertion, IMTS was consulted for admission.  Meds:  No current facility-administered medications on file prior to encounter.   Current Outpatient Medications on File Prior to Encounter  Medication Sig Dispense Refill   amLODipine (NORVASC) 5 MG tablet Take 1 tablet (5 mg total) by mouth daily. 30 tablet 2   aspirin 81 MG EC tablet Take 1 tablet (81 mg total) by mouth daily. Swallow whole. 30 tablet 11   atorvastatin (LIPITOR) 40 MG tablet Take 1 tablet (40 mg total) by mouth at bedtime. 30 tablet 30   Calcium Carb-Cholecalciferol (CALCIUM 600+D3 PO) Take 1 tablet by mouth at bedtime.     Cholecalciferol (VITAMIN D3 SUPER STRENGTH) 50 MCG (2000 UT) CAPS Take 4,000 Units by mouth daily.      clopidogrel (PLAVIX) 75 MG tablet Take 1 tablet (75 mg total) by mouth daily. 30 tablet 2   Cranberry 425 MG CAPS Take 425 mg by mouth daily.      furosemide (LASIX) 20 MG tablet Take 1 tablet (20 mg total) by mouth daily. 30 tablet 3   levothyroxine (SYNTHROID, LEVOTHROID) 50 MCG tablet Take 50 mcg by mouth daily  before breakfast.      lidocaine-prilocaine (EMLA) cream Apply 1 application topically See admin instructions. Apply to port-a-cath one hour prior to procedure     Multiple Vitamin (DAILY VITE PO) Take 1 tablet by mouth See admin instructions. Daily at 12 noon     Multiple Vitamins-Minerals (PRESERVISION AREDS PO) Take 1 tablet by mouth in the morning and at bedtime.     nystatin cream (MYCOSTATIN) Apply 1 application topically every 12 (twelve) hours as needed (to rash between the legs and groin).     psyllium (METAMUCIL) 58.6 % packet Take 1 packet by mouth every Monday, Wednesday, and Friday. ORANGE     rOPINIRole (REQUIP) 1 MG  tablet Take 1 mg by mouth at bedtime.     sertraline (ZOLOFT) 25 MG tablet Take 25 mg by mouth daily.     SPIRIVA HANDIHALER 18 MCG inhalation capsule Place 18 mcg into inhaler and inhale daily.      timolol (TIMOPTIC) 0.5 % ophthalmic solution Place 1 drop into both eyes 2 (two) times daily. Affected eye(s)     trimethoprim (TRIMPEX) 100 MG tablet Take 100 mg by mouth daily.     Allergies: Allergies as of 03/26/2021 - Review Complete 11/30/2020  Allergen Reaction Noted   Epipen [epinephrine hcl (nasal)] Palpitations 07/22/2013   Levaquin [levofloxacin in d5w] Other (See Comments) 07/22/2013   Beta adrenergic blockers Other (See Comments) 07/24/2020   Chlorhexidine gluconate Other (See Comments) 06/12/2014   Heparin Other (See Comments) 07/22/2013   Sulfa antibiotics Rash 07/22/2013   Past Medical History:  Diagnosis Date   CAD (coronary artery disease)    CABG 2009   Chronic diastolic (congestive) heart failure (HCC)    GERD (gastroesophageal reflux disease)    Heart attack ([place]) 2009   Heparin allergy 07/23/2020   History of blood transfusion 2009   Hypoxia 07/23/2020   Iron deficiency anemia due to chronic blood loss 08/07/2014   Mitral regurgitation    NHL (non-Hodgkin's lymphoma) ([place]) 10/18/2013   finished chemo dec 2015, maintenanance retuxin   Sinus bradycardia    TIA (transient ischemic attack)    Family History:  Family History  Problem Relation Age of Onset   Diverticulitis Mother    Heart attack Mother    Ulcers Father    Social History:  - [name] lives at [facility] living.  She is originally from [place] but lived in [place] prior to moving to [place] to be closer to family. - She is widowed.  She had 2 daughters but both have passed as well. - She is independent in most ADLs including ambulation, dressing herself, and bathing.  She receives assistance with medications though - Former tobacco use, however quit approximately 60  years ago.  She cannot recall how much she smoked per day - Denies any alcohol or drug use - In the case of an emergency, she would like her son-in-law Mr. Anner Crete, Brooke Bonito. to make decisions on her behalf.  Review of Systems: A complete ROS was negative except as per HPI.   Physical Exam: Blood pressure (!) 142/72, pulse 98, temperature (!) 97.4 F (36.3 C), temperature source Oral, resp. rate 20, SpO2 97 %.  Physical Exam Vitals and nursing note reviewed.  Constitutional:      General: She is not in acute distress.    Appearance: She is obese.  HENT:     Head: Normocephalic and atraumatic.     Mouth/Throat:     Mouth:  Mucous membranes are dry.  Neck:     Vascular: No JVD.  Cardiovascular:     Rate and Rhythm: Normal rate and regular rhythm.     Heart sounds: Murmur (3/6 systolic murmur) heard.  Pulmonary:     Effort: Pulmonary effort is normal. No accessory muscle usage or prolonged expiration.     Breath sounds: No transmitted upper airway sounds. Rales (Coarse crackles in heard in all lung fields) present. No decreased breath sounds, wheezing or rhonchi.  Abdominal:     Palpations: Abdomen is soft. There is no mass.     Tenderness: There is no abdominal tenderness.     Comments: Stoma present in the LLQ. Stoma is red and above the skin. No surrounding erythema.   Musculoskeletal:     Right lower leg: No tenderness. No edema.     Left lower leg: No tenderness. No edema.     Comments: Lower extremities equal in size  Skin:    General: Skin is warm and dry.     Findings: No ecchymosis or rash.  Neurological:     General: No focal deficit present.     Mental Status: She is alert and oriented to person, place, and time.     Motor: No weakness.  Psychiatric:        Mood and Affect: Mood normal.        Behavior: Behavior normal.   EKG: personally reviewed my interpretation is: Sinus rhythm. No acute ST or T wave changes.  CXR: personally reviewed my interpretation  is: Prominent interstitial markings throughout. Small right pleural effusion. No focal opacities.   Assessment & Plan by Problem: Principal Problem:   Acute respiratory failure with hypoxia [facility])  Mrs. Alithia Zavaleta is a 87 year old female with a past medical history of follicular large cell lymphoma s/p Rituxan in remission, CAD s/p CABG (2009), sinus bradycardia, hypertension, HFpEF, TIA, colon perforation s/p colostomy (2014) presenting to the ED with complaints of shortness of breath currently admitted for acute hypoxic respiratory failure.   # Acute Hypoxic Respiratory Failure  Etiology initially suspected to be COPD given wheezing on examination and hyperinflation on chest x-ray, however patient does not have any prior history of COPD and she does not have an extensive history of tobacco use.  It looks like she is on Spiriva at home though, unclear why.  She has never had formal PFTs. Differential also includes heart failure exacerbation due to dietary indiscretion, however chest x-ray is without significant vascular congestion and BNP is improved compared to prior.  On examination, patient appears hypovolemic without evidence of JVD or peripheral edema.  Examination is significant for diffuse coarse crackles.  I have reviewed her previous imaging including a CT abdomen/pelvis in 2018 with evidence of honeycombing in the bases concerning for fibrosis.  Previous chest x-rays also demonstrated prominent interstitial markings.  Due to this, we will need to evaluate for ILD, as patient's presentation may be an acute exacerbation.  Although low suspicion, will obtain RVP to rule out viral infection.  We will discontinue antibiotics, but continue daily prednisone until ILD is ruled out.  - Admit for observation - Continue supplemental oxygen to maintain oxygen saturation above 88%.  Wean as tolerated - High-resolution CT pending - RVP pending - Continue Prednisone 40 mg daily - Continue home  Spiriva - Discontinue empiric antibiotics due to low suspicion for pneumonia given lack of cough or focal x-ray findings  # HFpEF  Most recent TTE was in March 2022,  at which time there was evidence of RWMA (in the setting of HTN emergency), however LVEF of 50-55%. BNP elevated at 198 but decreased from prior. Clinically appears hypovolemic on examination, so will hold Lasix for now.   - Hold home Lasix - Monitor volume status daily  # Hypertension  Blood pressure within goal at this time.   - Continue home amlodipine 5 mg daily  # CAD s/p CABG -Continue home Aspirin and Clopidogrel  # Hypothyroidism Most recent TSH mildly elevated, however will not adjust levothyroxine at this time.  - Continue home levothyroxine 50 mcg daily  # Chronic Thrombocytopenia  Platelets are stable at this time. She is already established with Heme/Onc, Dr. Marin Olp. Will recommend she follow up with him after discharge for further evaluation.   Diet: Heart Healthy VTE: Arixtra (Hx of HIT) IVF: None,None Code: Full. Discussed code status extensively. Mrs. Helbling did not recall that she was a DNR prior despite having her yellow DNR form at bedside. We discussed the risks involved with resuscitation. At this time, she would like to return to Full Code for now.   Prior to Admission Living Arrangement:  ALF; [facility] Anticipated Discharge Location:  ALF; [facility] Barriers to Discharge: Continued medical evaluation with HRCT; Oxygen requirement  Dispo: Admit patient to Observation with expected length of stay less than 2 midnights.  Signed: Dr. Jose Persia Internal Medicine PGY-3 Pager: [phone] After 5pm on weekdays and 1pm on weekends: On Call pager [phone]  03/26/2021, 12:49 PM

## 2021-03-26 NOTE — Plan of Care (Signed)
°  Problem: Education: °Goal: Knowledge of disease or condition will improve °Outcome: Progressing °Goal: Knowledge of the prescribed therapeutic regimen will improve °Outcome: Progressing °Goal: Individualized Educational Video(s) °Outcome: Progressing °  °

## 2021-03-26 NOTE — ED Provider Notes (Signed)
[facility] EMERGENCY DEPARTMENT Provider Note   CSN: [id number] Arrival date & time: 03/26/21  0544     History Chief Complaint  Patient presents with   Shortness of Breath    Cerra Faux is a 87 y.o. female.  The history is provided by the patient.  Shortness of Breath Severity:  Severe Onset quality:  Sudden Timing:  Constant Progression:  Improving Chronicity:  New Relieved by:  Nothing Worsened by:  Nothing Associated symptoms: no chest pain, no cough and no fever   Patient with history of CAD, diastolic CHF, non-Hodgkin's lymphoma presents with shortness of breath. Patient reports she woke up short of breath. She reports she was at her baseline prior to going to bed She denies any fevers, no chest pain, no cough She does not use oxygen chronically     Past Medical History:  Diagnosis Date   CAD (coronary artery disease)    CABG 2009   Chronic diastolic (congestive) heart failure (HCC)    GERD (gastroesophageal reflux disease)    Heart attack ([place]) 2009   Heparin allergy 07/23/2020   History of blood transfusion 2009   Hypoxia 07/23/2020   Iron deficiency anemia due to chronic blood loss 08/07/2014   Mitral regurgitation    NHL (non-Hodgkin's lymphoma) ([place]) 10/18/2013   finished chemo dec 2015, maintenanance retuxin   Sinus bradycardia    TIA (transient ischemic attack)     Patient Active Problem List   Diagnosis Date Noted   Acute congestive heart failure (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   TIA (transient ischemic attack) 04/30/2017   Dyslipidemia 04/30/2017   Hx of CABG x 3/stent 04/30/2017   Bilateral carotid artery stenosis 04/30/2017   Non Hodgkin's lymphoma ([place]) 04/30/2017   Hypothyroidism 04/30/2017   Hypertension  04/30/2017   S/P colostomy ([place]) 04/30/2017   Anxiety disorder 04/30/2017   Abnormal urinalysis 04/30/2017   CAP (community acquired pneumonia) 12/13/2016   Acute lower UTI 12/13/2016   Glaucoma 12/13/2016   Colostomy in place ([place]) 12/13/2016   Vitamin D deficiency 40/98/1191   Follicular lymphoma grade IIIa of intra-abdominal lymph nodes ([place]) 04/22/2016   Iron deficiency anemia due to chronic blood loss 08/07/2014   HTN (hypertension) 07/15/2014   Unilateral carotid artery disease ([place]) 07/15/2014   Bradycardia 07/15/2014   Colon stricture ([place]) 06/13/2014   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   Preop cardiovascular exam 04/08/2014   NHL (non-Hodgkin's lymphoma) ([place]) 10/18/2013   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     Family History  Problem Relation Age of Onset   Diverticulitis Mother  Heart attack Mother    Ulcers Father     Social History   Tobacco Use   Smoking status: Former    Packs/day: 1.00    Years: 25.00    Pack years: 25.00    Types: Cigarettes    Start date: 05/16/1948    Quit date: 05/17/1963    Years since quitting: 57.8   Smokeless tobacco: Never   Tobacco comments:    quit smoking 50 years ago  Vaping Use   Vaping Use: Never used  Substance Use Topics   Alcohol use: No    Alcohol/week: 0.0 standard drinks   Drug use: No    Home Medications Prior  to Admission medications   Medication Sig Start Date End Date Taking? Authorizing Provider  amLODipine (NORVASC) 5 MG tablet Take 1 tablet (5 mg total) by mouth daily. 07/28/20   Rai, Vernelle Emerald, MD  aspirin 81 MG EC tablet Take 1 tablet (81 mg total) by mouth daily. Swallow whole. 07/28/20   Rai, Vernelle Emerald, MD  atorvastatin (LIPITOR) 40 MG tablet Take 1 tablet (40 mg total) by mouth at bedtime. 07/27/20   Rai, Vernelle Emerald, MD  Calcium Carb-Cholecalciferol (CALCIUM 600+D3 PO) Take 1 tablet by mouth at bedtime.    [provider]  Cholecalciferol (VITAMIN D3 SUPER STRENGTH) 50 MCG (2000 UT) CAPS Take 4,000 Units by mouth daily.     [provider]  clopidogrel (PLAVIX) 75 MG tablet Take 1 tablet (75 mg total) by mouth daily. 11/29/19   Donzetta Starch, NP  Cranberry 425 MG CAPS Take 425 mg by mouth daily.     [provider]  furosemide (LASIX) 20 MG tablet Take 1 tablet (20 mg total) by mouth daily. 07/28/20   Rai, Vernelle Emerald, MD  levothyroxine (SYNTHROID, LEVOTHROID) 50 MCG tablet Take 50 mcg by mouth daily before breakfast.  10/08/15   [provider]  lidocaine-prilocaine (EMLA) cream Apply 1 application topically See admin instructions. Apply to port-a-cath one hour prior to procedure 09/16/19   [provider]  Multiple Vitamin (DAILY VITE PO) Take 1 tablet by mouth See admin instructions. Daily at 12 noon    [provider]  Multiple Vitamins-Minerals (PRESERVISION AREDS PO) Take 1 tablet by mouth in the morning and at bedtime.    [provider]  nystatin cream (MYCOSTATIN) Apply 1 application topically every 12 (twelve) hours as needed (to rash between the legs and groin).    [provider]  psyllium (METAMUCIL) 58.6 % packet Take 1 packet by mouth every Monday, Wednesday, and Friday. ORANGE 11/29/19   Donzetta Starch, NP  rOPINIRole (REQUIP) 1 MG tablet Take 1 mg by mouth at bedtime.    [provider]  sertraline (ZOLOFT) 25  MG tablet Take 25 mg by mouth daily.    [provider]  SPIRIVA HANDIHALER 18 MCG inhalation capsule Place 18 mcg into inhaler and inhale daily.  07/23/15   [provider]  timolol (TIMOPTIC) 0.5 % ophthalmic solution Place 1 drop into both eyes 2 (two) times daily. Affected eye(s)    [provider]  trimethoprim (TRIMPEX) 100 MG tablet Take 100 mg by mouth daily. 11/15/20   [provider]    Allergies    Epipen [epinephrine hcl (nasal)], Levaquin [levofloxacin in d5w], Beta adrenergic blockers, Chlorhexidine gluconate, Heparin, and Sulfa antibiotics  Review of Systems   Review of Systems  Constitutional:  Negative for fever.  Respiratory:  Positive for shortness of breath.  Negative for cough.   Cardiovascular:  Negative for chest pain.  All other systems reviewed and are negative.  Physical Exam Updated Vital Signs BP (!) 148/66   Pulse 93   Temp (!) 97.4 F (36.3 C) (Oral)   Resp 17   SpO2 98%   Physical Exam CONSTITUTIONAL: Elderly, no acute distress HEAD: Normocephalic/atraumatic EYES: EOMI/PERRL ENMT: Mucous membranes dry NECK: supple no meningeal signs SPINE/BACK: Kyphotic spine CV: D6/L8 noted, systolic murmur noted LUNGS: Decreased breath sounds in the bases, wheezing noted in the upper lung fields ABDOMEN: soft, nontender, no rebound or guarding, bowel sounds noted throughout abdomen GU:no cva tenderness NEURO: Pt is awake/alert/appropriate, moves all extremitiesx4.  No facial droop.   EXTREMITIES: pulses normal/equal, full ROM SKIN: warm, color normal PSYCH: no abnormalities of mood noted, alert and oriented to situation  ED Results / Procedures / Treatments   Labs (all labs ordered are listed, but only abnormal results are displayed) Labs Reviewed  RESP PANEL BY RT-PCR (FLU A&B, COVID) [id number]  BASIC METABOLIC PANEL  BRAIN NATRIURETIC PEPTIDE  CBC WITH DIFFERENTIAL/PLATELET    EKG ED ECG REPORT   Date: 03/26/2021  7564  Rate: 91  Rhythm: normal sinus rhythm  QRS Axis: normal  Intervals: normal  ST/T Wave abnormalities: nonspecific ST changes  Conduction Disutrbances:none  Narrative Interpretation:   Old EKG Reviewed: unchanged  I have personally reviewed the EKG tracing and agree with the computerized printout as noted.   Radiology DG Chest Port 1 View  Result Date: 03/26/2021 CLINICAL DATA:  87 year old female with shortness of breath. EXAM: PORTABLE CHEST 1 VIEW COMPARISON:  Portable chest 07/23/2020 and earlier. FINDINGS: Portable AP semi upright view at 0555 hours. Stable right chest power port. Stable sequelae of CABG, sternotomy. Calcified aortic atherosclerosis. Mediastinal contours are stable and within normal limits. There is a degree of chronic pulmonary hyperinflation. No pneumothorax, pulmonary edema or consolidation. However, small layering right pleural effusion is apparent. Osteopenia. No acute osseous abnormality identified. Paucity of bowel gas in the upper abdomen. IMPRESSION: Chronic pulmonary hyperinflation with small right pleural effusion. Aortic Atherosclerosis (ICD10-I70.0). Electronically Signed   By: Genevie Ann M.D.   On: 03/26/2021 06:15    Procedures Procedures   Medications Ordered in ED Medications  ipratropium (ATROVENT) nebulizer solution 0.5 mg (has no administration in time range)  albuterol (PROVENTIL) (2.5 MG/3ML) 0.083% nebulizer solution 5 mg (5 mg Nebulization Given 03/26/21 0644)  predniSONE (DELTASONE) tablet 60 mg (60 mg Oral Given 03/26/21 0645)    ED Course  I have reviewed the triage vital signs and the nursing notes.  Pertinent labs & imaging results that were available during my care of the patient were reviewed by me and considered in my medical decision making (see chart for details).    MDM Rules/Calculators/A&P                           Patient presented with shortness of breath and hypoxia.  I personally reviewed the x-ray does not reveal  signs of CHF.  But due to oxygen requirement and wheezing, patient now receiving nebs and steroids.  This could represent reactive airway/bronchospasm. 7:08 AM Signed out to Dr Tyrone Nine at shift change to reassess patient  Final Clinical Impression(s) / ED Diagnoses Final diagnoses:  None    Rx / DC Orders ED Discharge Orders     None        Ripley Fraise, MD 03/26/21 [phone]

## 2021-03-26 NOTE — ED Triage Notes (Signed)
Pt bib [facility] after waking up with SOB. Pt had similar episode a few months ago and was diagnosed with CHF, on lasix. O2 89% RA , diminished lung sounds with upper rales. Duoneb given PTA.

## 2021-03-27 DIAGNOSIS — F419 Anxiety disorder, unspecified: Secondary | ICD-10-CM | POA: Diagnosis present

## 2021-03-27 DIAGNOSIS — Z8572 Personal history of non-Hodgkin lymphomas: Secondary | ICD-10-CM | POA: Diagnosis not present

## 2021-03-27 DIAGNOSIS — Z8673 Personal history of transient ischemic attack (TIA), and cerebral infarction without residual deficits: Secondary | ICD-10-CM | POA: Diagnosis not present

## 2021-03-27 DIAGNOSIS — I110 Hypertensive heart disease with heart failure: Secondary | ICD-10-CM | POA: Diagnosis present

## 2021-03-27 DIAGNOSIS — K219 Gastro-esophageal reflux disease without esophagitis: Secondary | ICD-10-CM | POA: Diagnosis present

## 2021-03-27 DIAGNOSIS — Z79899 Other long term (current) drug therapy: Secondary | ICD-10-CM | POA: Diagnosis not present

## 2021-03-27 DIAGNOSIS — Z7982 Long term (current) use of aspirin: Secondary | ICD-10-CM | POA: Diagnosis not present

## 2021-03-27 DIAGNOSIS — Z79890 Hormone replacement therapy (postmenopausal): Secondary | ICD-10-CM | POA: Diagnosis not present

## 2021-03-27 DIAGNOSIS — J441 Chronic obstructive pulmonary disease with (acute) exacerbation: Secondary | ICD-10-CM | POA: Diagnosis present

## 2021-03-27 DIAGNOSIS — J9601 Acute respiratory failure with hypoxia: Secondary | ICD-10-CM | POA: Diagnosis present

## 2021-03-27 DIAGNOSIS — Z951 Presence of aortocoronary bypass graft: Secondary | ICD-10-CM | POA: Diagnosis not present

## 2021-03-27 DIAGNOSIS — I5032 Chronic diastolic (congestive) heart failure: Secondary | ICD-10-CM | POA: Diagnosis present

## 2021-03-27 DIAGNOSIS — E669 Obesity, unspecified: Secondary | ICD-10-CM | POA: Diagnosis present

## 2021-03-27 DIAGNOSIS — J432 Centrilobular emphysema: Secondary | ICD-10-CM | POA: Diagnosis present

## 2021-03-27 DIAGNOSIS — I2510 Atherosclerotic heart disease of native coronary artery without angina pectoris: Secondary | ICD-10-CM | POA: Diagnosis present

## 2021-03-27 DIAGNOSIS — Z87891 Personal history of nicotine dependence: Secondary | ICD-10-CM | POA: Diagnosis not present

## 2021-03-27 DIAGNOSIS — Z8249 Family history of ischemic heart disease and other diseases of the circulatory system: Secondary | ICD-10-CM | POA: Diagnosis not present

## 2021-03-27 DIAGNOSIS — Z888 Allergy status to other drugs, medicaments and biological substances status: Secondary | ICD-10-CM | POA: Diagnosis not present

## 2021-03-27 DIAGNOSIS — Z9049 Acquired absence of other specified parts of digestive tract: Secondary | ICD-10-CM | POA: Diagnosis not present

## 2021-03-27 DIAGNOSIS — Z955 Presence of coronary angioplasty implant and graft: Secondary | ICD-10-CM | POA: Diagnosis not present

## 2021-03-27 DIAGNOSIS — Z20822 Contact with and (suspected) exposure to covid-19: Secondary | ICD-10-CM | POA: Diagnosis present

## 2021-03-27 DIAGNOSIS — I252 Old myocardial infarction: Secondary | ICD-10-CM | POA: Diagnosis not present

## 2021-03-27 DIAGNOSIS — E039 Hypothyroidism, unspecified: Secondary | ICD-10-CM | POA: Diagnosis present

## 2021-03-27 DIAGNOSIS — D696 Thrombocytopenia, unspecified: Secondary | ICD-10-CM | POA: Diagnosis present

## 2021-03-27 DIAGNOSIS — Z7902 Long term (current) use of antithrombotics/antiplatelets: Secondary | ICD-10-CM | POA: Diagnosis not present

## 2021-03-27 MED ORDER — GUAIFENESIN-DM 100-10 MG/5ML PO SYRP
5.0000 mL | ORAL_SOLUTION | ORAL | Status: DC | PRN
  Administered 2021-03-27: 5 mL via ORAL
  Filled 2021-03-27: qty 5

## 2021-03-27 MED ORDER — PHENOL 1.4 % MT LIQD
1.0000 | OROMUCOSAL | Status: DC | PRN
  Administered 2021-03-27: 1 via OROMUCOSAL
  Filled 2021-03-27: qty 177

## 2021-03-27 NOTE — Evaluation (Signed)
Occupational Therapy Evaluation Patient Details Name: Makayla Fisher MRN: [id number] DOB: 05-06-1922 Today's Date: 03/27/2021   History of Present Illness Makayla Fisher is a 87 y.o. female who presented 12/2 with new SOB, from  [facility] assisted [facility] facility. 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 Pt does not drive but walks to the cafeteria for most meals at her ALF. Upon evaluation pt was min guard for functional ambulation, and up to min A for lower body ADLs. She has incontinent bladder at baseline. Pt was on RA for entire session with SpO2 dropping to 88% briefly after functional ambulation, however O2 recovered to >90% quickly with breathing techniques and remained >90% for the rest of the session.      Recommendations for follow up therapy are one component of a multi-disciplinary discharge planning process, led by the attending physician.  Recommendations may be updated based on patient status, additional functional criteria and insurance authorization.   Follow Up Recommendations  No OT follow up    Assistance Recommended at Discharge Intermittent Supervision/Assistance  Functional Status Assessment  Patient has had a recent decline in their functional status and demonstrates the ability to make significant improvements in function in a reasonable and predictable amount of time.  Equipment Recommendations  None recommended by OT       Precautions / Restrictions Precautions Precautions: Fall Precaution Comments: watch O2 Restrictions Weight Bearing Restrictions: No      Mobility Bed Mobility Overal bed mobility: Needs Assistance Bed Mobility: Supine to Sit     Supine to sit: Supervision     General bed mobility comments: for safety, no A needed    Transfers Overall  transfer level: Needs assistance Equipment used: Rolling walker (2 wheels) Transfers: Sit to/from Stand Sit to Stand: Min guard           General transfer comment: no safety, pt with good awareness of lines      Balance Overall balance assessment: Needs assistance Sitting-balance support: Feet supported Sitting balance-Leahy Scale: Good     Standing balance support: No upper extremity supported;During functional activity Standing balance-Leahy Scale: Fair             ADL either performed or assessed with clinical judgement   ADL Overall ADL's : Needs assistance/impaired Eating/Feeding: Independent;Sitting   Grooming: Set up;Sitting   Upper Body Bathing: Set up;Sitting;Supervision/ safety   Lower Body Bathing: Minimal assistance;Sit to/from stand   Upper Body Dressing : Supervision/safety;Set up;Sitting   Lower Body Dressing: Minimal assistance;Sit to/from stand   Toilet Transfer: Min guard;Ambulation;Rolling walker (2 wheels)   Toileting- Clothing Manipulation and Hygiene: Min guard;Sitting/lateral lean       Functional mobility during ADLs: Min guard;Rolling walker (2 wheels) General ADL Comments: min A for lower body tasks and cues for breathing technique throughout     Vision Baseline Vision/History: 1 Wears glasses Ability to See in Adequate Light: 0 Adequate Vision Assessment?: No apparent visual deficits      Pertinent Vitals/Pain Pain Assessment: No/denies pain     Hand Dominance Right   Extremity/Trunk Assessment Upper Extremity Assessment Upper Extremity Assessment: Generalized weakness   Lower Extremity Assessment Lower Extremity Assessment: Defer to PT evaluation   Cervical / Trunk Assessment Cervical / Trunk Assessment: Kyphotic (mild)   Communication Communication Communication: HOH   Cognition  Arousal/Alertness: Awake/alert Behavior During Therapy: WFL for tasks assessed/performed Overall Cognitive Status: Within Functional  Limits for tasks assessed             General Comments: pleasant, Ox4     General Comments  SpO2 dropped to 88% briefly on RA, with cues to breathing SpO2 recovered to >90% for remainder of session     [place] expects to be discharged to:: Assisted living Living Arrangements: Alone                           Home Equipment: Rolling Walker (2 wheels);Shower seat - built in;BSC/3in1   Additional Comments: [facility]      Prior Functioning/Environment Prior Level of Function : Independent/Modified Independent             Mobility Comments: uses walker all the time ADLs Comments: no A needed, sits on seat to shower, has BSC over toilet        OT Problem List: Cardiopulmonary status limiting activity;Decreased activity tolerance;Impaired balance (sitting and/or standing);Decreased safety awareness      OT Treatment/Interventions: Self-care/ADL training;Therapeutic exercise;Therapeutic activities;Patient/family education;Balance training    OT Goals(Current goals can be found in the care plan section) Acute Rehab OT Goals Patient Stated Goal: home soon OT Goal Formulation: With patient Time For Goal Achievement: 03/27/21 Potential to Achieve Goals: Fair ADL Goals Pt Will Perform Grooming: with modified independence;standing Pt Will Perform Lower Body Dressing: with modified independence;sit to/from stand Pt Will Transfer to Toilet: with modified independence;ambulating Pt/caregiver will Perform Home Exercise Program: Increased strength;Both right and left upper extremity;With written HEP provided;Independently  OT Frequency: Min 2X/week    AM-PAC OT "6 Clicks" Daily Activity     Outcome Measure Help from another person eating meals?: None Help from another person taking care of personal grooming?: A Little Help from another person toileting, which includes using toliet, bedpan, or urinal?: A Little Help from another person  bathing (including washing, rinsing, drying)?: A Little Help from another person to put on and taking off regular upper body clothing?: A Little Help from another person to put on and taking off regular lower body clothing?: A Little 6 Click Score: 19   End of Session Equipment Utilized During Treatment: Rolling walker (2 wheels) Nurse Communication: Mobility status (SpO2)  Activity Tolerance: Patient tolerated treatment well Patient left: in chair;with call bell/phone within reach;with chair alarm set  OT Visit Diagnosis: Unsteadiness on feet (R26.81);Muscle weakness (generalized) (M62.81)                Time: 2202-5427 OT Time Calculation (min): 23 min Charges:  OT General Charges $OT Visit: 1 Visit OT Evaluation $OT Eval Moderate Complexity: 1 Mod OT Treatments $Self Care/Home Management : 8-22 mins   Janesa Dockery A Keylin Ferryman 03/27/2021, 12:51 PM

## 2021-03-27 NOTE — Evaluation (Signed)
Physical Therapy Evaluation Patient Details Name: Makayla Fisher MRN: [id number] DOB: December 13, 1922 Today's Date: 03/27/2021  History of Present Illness  Makayla Fisher is a 87 y.o. female who presented 12/2 with new SOB, from  [facility] assisted [facility] facility. PMHx: CAD, CHF, Heart attack, hypoxia, anemia, TIA, prior non-Hodgkin's lymphoma in remission, colostomy due to prior bowel obstruction  Clinical Impression  PTA, pt lives at an ALF, uses a Rollator, and is independent with ADL's. Pt presents with decreased cardiopulmonary endurance and generalized weakness. Ambulating 200 feet with a walker at a supervision level. Brief desaturation to 88% on RA towards end of walk; rebounded to 92% with pursed lip breathing and seated rest break. BP pre mobility 157/70, post mobility 184/73 (RN notified). Education provided regarding energy conservation techniques. Recommend HHPT at discharge.      Recommendations for follow up therapy are one component of a multi-disciplinary discharge planning process, led by the attending physician.  Recommendations may be updated based on patient status, additional functional criteria and insurance authorization.  Follow Up Recommendations Home health PT    Assistance Recommended at Discharge PRN  Functional Status Assessment Patient has had a recent decline in their functional status and demonstrates the ability to make significant improvements in function in a reasonable and predictable amount of time.  Equipment Recommendations  None recommended by PT    Recommendations for Other Services       Precautions / Restrictions Precautions Precautions: Fall Precaution Comments: watch O2 Restrictions Weight Bearing Restrictions: No      Mobility  Bed Mobility Overal bed mobility: Needs Assistance Bed Mobility: Supine to Sit     Supine to sit: Supervision     General bed mobility comments: OOB in chair    Transfers Overall transfer level:  Needs assistance Equipment used: Rolling walker (2 wheels) Transfers: Sit to/from Stand Sit to Stand: Supervision           General transfer comment: no safety, pt with good awareness of lines    Ambulation/Gait Ambulation/Gait assistance: Supervision Gait Distance (Feet): 200 Feet Assistive device: Rolling walker (2 wheels) Gait Pattern/deviations: Step-through pattern;Decreased stride length Gait velocity: decreased     General Gait Details: slow and steady pace, cues for activity pacing  Stairs            Wheelchair Mobility    Modified Rankin (Stroke Patients Only)       Balance Overall balance assessment: Needs assistance Sitting-balance support: Feet supported Sitting balance-Leahy Scale: Good     Standing balance support: No upper extremity supported;During functional activity Standing balance-Leahy Scale: Fair                               Pertinent Vitals/Pain Pain Assessment: No/denies pain    Home Living Family/patient expects to be discharged to:: Assisted living Living Arrangements: Alone               Home Equipment: Conservation officer, nature (2 wheels);Shower seat - built in;BSC/3in1 Additional Comments: [facility]    Prior Function Prior Level of Function : Independent/Modified Independent             Mobility Comments: uses walker all the time ADLs Comments: no A needed, sits on seat to shower, has BSC over toilet     Hand Dominance   Dominant Hand: Right    Extremity/Trunk Assessment   Upper Extremity Assessment Upper Extremity Assessment: Generalized weakness    Lower  Extremity Assessment Lower Extremity Assessment: Generalized weakness    Cervical / Trunk Assessment Cervical / Trunk Assessment: Kyphotic (mild)  Communication   Communication: HOH  Cognition Arousal/Alertness: Awake/alert Behavior During Therapy: WFL for tasks assessed/performed Overall Cognitive Status: Within Functional Limits  for tasks assessed                                 General Comments: pleasant, Ox4        General Comments General comments (skin integrity, edema, etc.): SpO2 dropped to 88% briefly on RA, with cues to breathing SpO2 recovered to >90% for remainder of session    Exercises     Assessment/Plan    PT Assessment Patient needs continued PT services  PT Problem List Decreased strength;Decreased activity tolerance;Decreased balance;Decreased mobility;Cardiopulmonary status limiting activity       PT Treatment Interventions DME instruction;Gait training;Functional mobility training;Therapeutic activities;Therapeutic exercise;Balance training;Patient/family education    PT Goals (Current goals can be found in the Care Plan section)  Acute Rehab PT Goals Patient Stated Goal: return to walking to dining hall and around stores PT Goal Formulation: With patient Time For Goal Achievement: 04/10/21 Potential to Achieve Goals: Good    Frequency Min 3X/week   Barriers to discharge        Co-evaluation               AM-PAC PT "6 Clicks" Mobility  Outcome Measure Help needed turning from your back to your side while in a flat bed without using bedrails?: None Help needed moving from lying on your back to sitting on the side of a flat bed without using bedrails?: A Little Help needed moving to and from a bed to a chair (including a wheelchair)?: A Little Help needed standing up from a chair using your arms (e.g., wheelchair or bedside chair)?: A Little Help needed to walk in hospital room?: A Little Help needed climbing 3-5 steps with a railing? : A Little 6 Click Score: 19    End of Session   Activity Tolerance: Patient tolerated treatment well Patient left: in chair;with call bell/phone within reach;with chair alarm set Nurse Communication: Mobility status;Other (comment) (BP) PT Visit Diagnosis: Unsteadiness on feet (R26.81);Difficulty in walking, not elsewhere  classified (R26.2)    Time: 6384-6659 PT Time Calculation (min) (ACUTE ONLY): 31 min   Charges:   PT Evaluation $PT Eval Moderate Complexity: 1 Mod PT Treatments $Therapeutic Activity: 8-22 mins        Wyona Almas, PT, DPT Acute Rehabilitation Services Pager [phone] Office [phone]   Deno Etienne 03/27/2021, 3:33 PM

## 2021-03-27 NOTE — Progress Notes (Signed)
Subjective: Patient examined at bedside, states her shortness of breath has improved from yesterday, she walked with PT today. Did have some shortness of breath after walking. States otherwise feeling well but anxious to return to her ALF. Notes she see PA at ALF [facility] for medical care. Denies chest pain, fatigue, abominal pain, dizziness, or lightheadedness. Denies any other acute complaints.  Objective:  Vital signs in last 24 hours: Vitals:   03/27/21 1100 03/27/21 1101 03/27/21 1103 03/27/21 1611  BP: (!) 125/54   (!) 149/55  Pulse: (!) 39   63  Resp: 13 (!) 21 17   Temp: 98.5 F (36.9 C)     TempSrc: Oral     SpO2: 92%     Weight:       Constitutional: Elderly witting up in chair in no acute distress HENT: Normocephalic and atraumatic, moist mucous membranes Cardiovascular: Normal rate, regular rhythm, No  Respiratory: No respiratory distress, no accessory muscle use.  Effort is normal. Saturating well on RA. Crackles in lower and mid lung fields R>L GI: Nondistended, soft, nontender to palpation, normal active bowel sounds Musculoskeletal: Normal bulk and tone.  No peripheral edema noted. Neurological: Is alert and oriented x4, no apparent focal deficits noted. Skin: Warm and dry.  Psychiatric: Normal mood and affect.   Assessment/Plan:  Principal Problem:   Acute respiratory failure with hypoxia (HCC) Active Problems:   Colostomy in place [facility])   Anxiety disorder   Obesity   Emphysema of lung [facility])  [name] is a 87 year old female with a past medical history of follicular large cell lymphoma s/p Rituxan in remission, CAD s/p CABG (2009), sinus bradycardia, hypertension, HFpEF, TIA, colon perforation s/p colostomy (2014) presenting to the ED with complaints of shortness of breath currently admitted for acute hypoxic respiratory failure secondary to COPD exacerbation   # Acute Hypoxic Respiratory Failure  Likely secondary to COPD exacerbation. Has  emphysematous changes present on imaging since 2015. Uses spirva at home.  She has never had formal PFTs. Continues to have coarse crackles. R>L in lower to mid lung fields  Initial concern for ILD and HRCT ordered with moderate centrilobular and paraseptal emphysema. Mild, diffuse bilateral bronchial wall thickening that may reflect minimal fibrotic interstitial lung disease or alternately minimal edema. Low suspicion for ILD at this time. Patient feeling better this morning but does still have some SOB with exertion. Walked with PT with minimal desaturations.RVP negative - wean O2 as tolerated - Continue Prednisone 40 mg daily for 5 days - Continue home Spiriva - Recommend PFT and pulmonary follow up as an outpatient   # HFpEF  Most recent TTE was in March 2022, at which time there was evidence of RWMA (in the setting of HTN emergency), however LVEF of 50-55%. Remains euvolemic on exam - Continue to hold home lasix - Monitor volume status   # Hypertension  - continue home amlodipine   # CAD s/p CABG -Continue home Aspirin and Clopidogrel   # Hypothyroidism - Continue home levothyroxine 50 mcg daily - outpatient follow up for TSH as recently elevated   # Chronic Thrombocytopenia  Platelets are stable at this time. She is already established with Heme/Onc, Dr. Marin Olp. Will recommend she follow up with him after discharge for further evaluation  Prior to Admission Living Arrangement: Anticipated Discharge Location: Barriers to Discharge: Dispo: Anticipated discharge in approximately 1 day(s).   Iona Beard, MD 03/27/2021, 4:25 PM Pager: [phone] After 5pm on weekdays and 1pm on weekends:  On Call pager [phone]

## 2021-03-28 LAB — CBC
HCT: 37.4 % (ref 36.0–46.0)
Hemoglobin: 12.1 g/dL (ref 12.0–15.0)
MCH: 31.5 pg (ref 26.0–34.0)
MCHC: 32.4 g/dL (ref 30.0–36.0)
MCV: 97.4 fL (ref 80.0–100.0)
Platelets: 125 10*3/uL — ABNORMAL LOW (ref 150–400)
RBC: 3.84 MIL/uL — ABNORMAL LOW (ref 3.87–5.11)
RDW: 14.3 % (ref 11.5–15.5)
WBC: 13.2 10*3/uL — ABNORMAL HIGH (ref 4.0–10.5)
nRBC: 0 % (ref 0.0–0.2)

## 2021-03-28 MED ORDER — FUROSEMIDE 20 MG PO TABS: 20.0000 mg | ORAL_TABLET | Freq: Every day | ORAL | Status: DC

## 2021-03-28 NOTE — Progress Notes (Signed)
Subjective:  No acute overnight events patient evaluated bedside.  Patient states that she was feeling well on room air until she woke up this morning with a cough.  States she feels like she may have swallowed something down the wrong pipe or just had a lot of mucus.  She was able to cough up a large amount of thick sputum.  She states this is a normal occurrence for her in the morning.  But does note that she felt quite short of breath after this and was placed on 2 L of oxygen.  She now feels that she is doing well on the 2 L but does not know she will how she will do on room air.  Patient continues to have shortness of breath with exertion and feels she would likely be sent back to hospital with her symptoms at her ALF.   Objective:  Vital signs in last 24 hours: Vitals:   03/27/21 1101 03/27/21 1103 03/27/21 1611 03/28/21 0316  BP:   (!) 149/55 (!) 167/67  Pulse:   63 74  Resp: (!) 21 17  16   Temp:    97.9 F (36.6 C)  TempSrc:    Oral  SpO2:    96%  Weight:    69.5 kg   Constitutional: Elderly woman, laying in bed, in no acute distress HENT: Normocephalic and atraumatic, moist mucous membranes Cardiovascular: Normal rate, regular rhythm, No pitting edema Respiratory: Bilateral coarse crackles in bilateral lung bases up to mid lungs, no wheezing, no increased work of breathing GI: Nondistended, soft, nontender to palpation Musculoskeletal: Normal bulk and tone.   Neurological: Is alert and oriented x4, no apparent focal deficits noted. Skin: Warm and dry.  Psychiatric: anxious  Assessment/Plan:  Principal Problem:   Acute respiratory failure with hypoxia (HCC) Active Problems:   Colostomy in place [facility])   Anxiety disorder   Obesity   Emphysema of lung [facility])  [name] is a 87 year old female with a past medical history of follicular large cell lymphoma s/p Rituxan in remission, CAD s/p CABG (2009), sinus bradycardia, hypertension, HFpEF, TIA, colon  perforation s/p colostomy (2014) presenting to the ED with complaints of shortness of breath currently admitted for acute hypoxic respiratory failure secondary to COPD exacerbation   # Acute Hypoxic Respiratory Failure  Likely secondary to COPD exacerbation. Has emphysematous changes present on imaging since 2015. Uses spirva at home. She has never had formal PFTs. Initial concern for ILD given diffuse bilateral crackles imagin and HRCT ordered with moderate centrilobular and paraseptal emphysema. Mild, diffuse bilateral bronchial wall thickening that may reflect minimal fibrotic interstitial lung disease or alternately minimal edema. Low suspicion for ILD at this time. Ambulatory O2 remain above 88% yesterday but she continues to have significant dyspnea with exertion. Now on 2L after coughing this morning, but has not had significant desaturations. Will continue to try to wean off O2.  - wean O2 as tolerated - Will repeat ambulatory O2 saturations - Continue Prednisone 40 mg daily to complete 5 days - Continue home Spiriva - Recommend PFT and pulmonary follow up as an outpatient   # HFpEF  Most recent TTE was in March 2022, at which time there was evidence of RWMA (in the setting of HTN emergency), however LVEF of 50-55%. Remains euvolemic on exam - Continue to hold home lasix - Monitor volume status   # Hypertension  - continue home amlodipine   # CAD s/p CABG -Continue home Aspirin and Clopidogrel   #  Hypothyroidism - Continue home levothyroxine 50 mcg daily - outpatient follow up for TSH as recently elevated   # Chronic Thrombocytopenia  Platelets are stable at this time. She is already established with Heme/Onc, Dr. Marin Olp. Will recommend she follow up with him after discharge for further evaluation  Prior to Admission Living Arrangement: Anticipated Discharge Location: Barriers to Discharge: Dispo: Anticipated discharge in approximately 1 day(s).   Iona Beard,  MD 03/28/2021, 6:31 AM Pager: [phone] After 5pm on weekdays and 1pm on weekends: On Call pager [phone]

## 2021-03-29 DIAGNOSIS — J9601 Acute respiratory failure with hypoxia: Secondary | ICD-10-CM | POA: Diagnosis not present

## 2021-03-29 LAB — RESP PANEL BY RT-PCR (FLU A&B, COVID) ARPGX2
Influenza A by PCR: NEGATIVE
Influenza B by PCR: NEGATIVE
SARS Coronavirus 2 by RT PCR: NEGATIVE

## 2021-03-29 MED ORDER — PREDNISONE 20 MG PO TABS: 40.0000 mg | ORAL_TABLET | Freq: Every day | ORAL | 0 refills | Status: AC

## 2021-03-29 MED ORDER — GUAIFENESIN-DM 100-10 MG/5ML PO SYRP: 5.0000 mL | ORAL_SOLUTION | ORAL | 0 refills | Status: DC | PRN

## 2021-03-29 MED ORDER — HEPARIN SOD (PORK) LOCK FLUSH 100 UNIT/ML IV SOLN
500.0000 [IU] | INTRAVENOUS | Status: DC | PRN
  Filled 2021-03-29: qty 5

## 2021-03-29 MED ORDER — HEPARIN SOD (PORK) LOCK FLUSH 100 UNIT/ML IV SOLN
500.0000 [IU] | INTRAVENOUS | Status: AC | PRN
  Administered 2021-03-29: 500 [IU]
  Filled 2021-03-29: qty 5

## 2021-03-29 NOTE — Discharge Summary (Signed)
Name: Makayla Fisher MRN: [id number] DOB: [birth year] 87 y.o. PCP: [name], DO  Date of Admission: 03/26/2021  5:44 AM Date of Discharge:   03/29/21 Attending Physician: Axel Filler, *  Discharge Diagnosis: 1. Acute Hypoxic Respiratory Failure  HFpEF  Hypertension  CAD s/p CABG Hypothyroidism Chronic Thrombocytopenia    Discharge Medications: Allergies as of 03/29/2021       Reactions   Epipen [epinephrine Hcl (nasal)] Palpitations   Levaquin [levofloxacin In D5w] Other (See Comments)   Weakness- "Allergic," per MAR   Beta Adrenergic Blockers Other (See Comments)   intolerant to higher doses of BB due to bradycardia.   Chlorhexidine Gluconate Other (See Comments)   Pt declined use and suspects she may have an intolerance   Heparin Other (See Comments)   "Alleregic," pre MAR- Maybe affected platelets, per patient   Sulfa Antibiotics Rash        Medication List     TAKE these medications    amLODipine 5 MG tablet Commonly known as: NORVASC Take 1 tablet (5 mg total) by mouth daily.   Aspirin Low Dose 81 MG EC tablet Generic drug: aspirin Take 1 tablet (81 mg total) by mouth daily. Swallow whole.   atorvastatin 40 MG tablet Commonly known as: LIPITOR Take 1 tablet (40 mg total) by mouth at bedtime.   CALCIUM 600+D3 PO Take 1 tablet by mouth at bedtime.   clopidogrel 75 MG tablet Commonly known as: PLAVIX Take 1 tablet (75 mg total) by mouth daily.   Cranberry 425 MG Caps Take 425 mg by mouth daily.   DAILY VITE PO Take 1 tablet by mouth daily. s   docusate sodium 100 MG capsule Commonly known as: COLACE Take 100 mg by mouth daily as needed for mild constipation.   furosemide 20 MG tablet Commonly known as: LASIX Take 1 tablet (20 mg total) by mouth daily.   guaiFENesin-dextromethorphan 100-10 MG/5ML syrup Commonly known as: ROBITUSSIN DM Take 5 mLs by mouth every 4 (four) hours as needed for cough.   levothyroxine 50 MCG  tablet Commonly known as: SYNTHROID Take 50 mcg by mouth daily before breakfast.   lidocaine-prilocaine cream Commonly known as: EMLA Apply 1 application topically See admin instructions. Apply to port-a-cath one hour prior to procedure   nystatin cream Commonly known as: MYCOSTATIN Apply 1 application topically every 12 (twelve) hours as needed (to rash between the legs and groin).   predniSONE 20 MG tablet Commonly known as: DELTASONE Take 2 tablets (40 mg total) by mouth daily with breakfast for 1 day. Start taking on: March 30, 2021   PRESERVISION AREDS PO Take 1 tablet by mouth in the morning and at bedtime.   psyllium 58.6 % packet Commonly known as: METAMUCIL Take 1 packet by mouth every Monday, Wednesday, and Friday. ORANGE What changed: additional instructions   rOPINIRole 1 MG tablet Commonly known as: REQUIP Take 1 mg by mouth at bedtime.   sertraline 25 MG tablet Commonly known as: ZOLOFT Take 25 mg by mouth daily.   Spiriva HandiHaler 18 MCG inhalation capsule Generic drug: tiotropium Place 18 mcg into inhaler and inhale daily.   timolol 0.5 % ophthalmic solution Commonly known as: TIMOPTIC Place 1 drop into both eyes 2 (two) times daily.   trimethoprim 100 MG tablet Commonly known as: TRIMPEX Take 100 mg by mouth daily.   Vitamin D3 Super Strength 50 MCG (2000 UT) Caps Generic drug: Cholecalciferol Take 4,000 Units by mouth daily.  Durable Medical Equipment  (From admission, onward)           Start     Ordered   03/29/21 1104  For home use only DME oxygen  Once       Question Answer Comment  Length of Need 6 Months   Frequency Continuous (stationary and portable oxygen unit needed)   Oxygen delivery system Gas      03/29/21 1103            Disposition and follow-up:   Ms.Andi Henzler was discharged from [facility] in Stable condition.  At the hospital follow up visit please  address:  1.  Acute Hypoxic Respiratory Failure - reassess dyspnea on exertion and need for supplemental oxygen. Would benefit from PFTs. Ensure patient has pulmonology follow up.   2. HFpEF- assess volume status  3. Hypothyroidism- recheck TSH  4. Chronic thrombocytopenia- established with Heme/Onc Dr. Marin Olp, ensure patient has follow up scheduled.  5.  Labs / imaging needed at time of follow-up: cbc, bmp  6.  Pending labs/ test needing follow-up: N/A  Follow-up Appointments:   Hospital Course by problem list: 1. Acute Hypoxic Respiratory Failure  Patient presented with significant wheezing and hyperinflation on CXR on the setting shortness of breath and a new oxygen requirement on exertion. Patient had no formal diagnosis of COPD in the past and did not have an extensive history of smoking so high resolution CT was obtained which showed moderate centrilobular and paraseptal emphysema. Mild, diffuse bilateral bronchial wall thickening that may reflect minimal fibrotic interstitial lung disease or alternately minimal edema. Patient was started on a 5 day course of prednisone 40 as well as home spiriva. Patient's oxygen was weaned down to 1 L but did have some desaturations with exertion and will require home oxygen. Recommend PFTs and pulmonary follow up as an outpatient.   2. HFpEF  Patient was euvolemic on exam, last TTE was in march 2022 with evidence of RWMA (in the setting of HTN emergency), however LVEF of 50-55%. Patient to continue lasix upon discharge.   3. Hypertension  Continued home amlodipine   4. CAD s/p CABG Continued home Aspirin and Clopidogrel   5. Hypothyroidism Continued home levothyroxine 50 mcg daily. Recommend outpatient follow up for TSH as recently elevated.   6. Chronic Thrombocytopenia  Platelets are stable at this time. She is already established with Heme/Onc, Dr. Marin Olp. Will recommend she follow up with him after discharge for further  evaluation  Discharge Exam:   BP (!) 145/68 (BP Location: Left Arm)   Pulse (!) 59   Temp 97.9 F (36.6 C) (Oral)   Resp 15   Wt 70.1 kg   SpO2 98%   BMI 29.20 kg/m  Discharge exam:  Gen: Elderly woman laying in bed in NAD Resp: normal WOB on 1 L Abd: soft nontender nondistended Neuro: alert and oriented x3 answering questions appropriately Skin:warm and dry Psych: normal affect  Pertinent Labs, Studies, and Procedures:   CT Chest High Resolution  Result Date: 03/26/2021 CLINICAL DATA:  Shortness of breath, suspicion for interstitial lung disease EXAM: CT CHEST WITHOUT CONTRAST TECHNIQUE: Multidetector CT imaging of the chest was performed following the standard protocol without intravenous contrast. High resolution imaging of the lungs, as well as inspiratory and expiratory imaging, was performed. COMPARISON:  CT chest abdomen pelvis, 08/19/2013, chest radiographs, 05/31/2017 FINDINGS: Cardiovascular: Right chest port catheter. Aortic atherosclerosis. Normal heart size. Three-vessel coronary artery calcifications status post median sternotomy and  CABG. No pericardial effusion. Mediastinum/Nodes: No enlarged mediastinal, hilar, or axillary lymph nodes. Thyroid gland, trachea, and esophagus demonstrate no significant findings. Lungs/Pleura: Moderate centrilobular and paraseptal emphysema. Mild, diffuse bilateral bronchial wall thickening. No significant air trapping on expiratory phase imaging. Small left, trace right pleural effusions. Mild irregular peripheral interstitial opacity and septal thickening at the lung bases, generally similar to prior examination. Upper Abdomen: No acute abnormality. Large exophytic superior pole left renal cyst in the left upper quadrant. Musculoskeletal: No chest wall mass or suspicious bone lesions identified. Unchanged wedge deformity of T8. IMPRESSION: 1. Moderate centrilobular and paraseptal emphysema. 2. Mild, diffuse bilateral bronchial wall thickening,  consistent with nonspecific infectious or inflammatory bronchitis. 3. Mild irregular peripheral interstitial opacity and septal thickening at the lung bases, generally similar to prior examination. This may reflect minimal fibrotic interstitial lung disease or alternately minimal edema associated with pleural effusions but is generally similar to prior examination dated 2015, at which time pleural effusions were also present. Please note that assessment for fibrotic interstitial lung disease in the acute setting is frequently confounded by the presence of pleural effusions and acute airspace disease and should generally be deferred to the outpatient setting. If fibrotic findings are strictly characterized by ATS pulmonary fibrosis criteria, findings are in an early "indeterminate for UIP" pattern per consensus guidelines: Diagnosis of Idiopathic Pulmonary Fibrosis: An Official ATS/ERS/JRS/ALAT Clinical Practice Guideline. [place], Iss 5, [phone], Dec 24 2016. 4. Small left, trace right pleural effusions. 5. Coronary artery disease. Aortic Atherosclerosis (ICD10-I70.0) and Emphysema (ICD10-J43.9). Electronically Signed   By: Delanna Ahmadi M.D.   On: 03/26/2021 14:45   DG Chest Port 1 View  Result Date: 03/26/2021 CLINICAL DATA:  87 year old female with shortness of breath. EXAM: PORTABLE CHEST 1 VIEW COMPARISON:  Portable chest 07/23/2020 and earlier. FINDINGS: Portable AP semi upright view at 0555 hours. Stable right chest power port. Stable sequelae of CABG, sternotomy. Calcified aortic atherosclerosis. Mediastinal contours are stable and within normal limits. There is a degree of chronic pulmonary hyperinflation. No pneumothorax, pulmonary edema or consolidation. However, small layering right pleural effusion is apparent. Osteopenia. No acute osseous abnormality identified. Paucity of bowel gas in the upper abdomen. IMPRESSION: Chronic pulmonary hyperinflation with small right  pleural effusion. Aortic Atherosclerosis (ICD10-I70.0). Electronically Signed   By: Genevie Ann M.D.   On: 03/26/2021 06:15    CBC Latest Ref Rng & Units 03/28/2021 03/26/2021 11/23/2020  WBC 4.0 - 10.5 K/uL 13.2(H) 11.1(H) 8.6  Hemoglobin 12.0 - 15.0 g/dL 12.1 13.1 13.6  Hematocrit 36.0 - 46.0 % 37.4 41.7 41.6  Platelets 150 - 400 K/uL 125(L) 126(L) 147(L)    BMP Latest Ref Rng & Units 03/26/2021 11/23/2020 07/27/2020  Glucose 70 - 99 mg/dL 128(H) 99 93  BUN 8 - 23 mg/dL 20 15 35(H)  Creatinine 0.44 - 1.00 mg/dL 1.09(H) 0.96 1.25(H)  Sodium 135 - 145 mmol/L 139 139 139  Potassium 3.5 - 5.1 mmol/L 3.7 3.7 4.2  Chloride 98 - 111 mmol/L 105 103 101  CO2 22 - 32 mmol/L 26 27 29   Calcium 8.9 - 10.3 mg/dL 9.2 9.7 8.8(L)    Discharge Instructions:   Signed: Scarlett Presto, MD 03/29/2021, 6:49 AM   Pager: [phone]

## 2021-03-29 NOTE — NC FL2 (Addendum)
[place] LEVEL OF CARE SCREENING TOOL     IDENTIFICATION  Patient Name: Makayla Fisher Birthdate: February 10, 1923 Sex: female Admission Date (Current Location): 03/26/2021  [facility] and [place] Number:  Herbalist and Address:  [facility]. [facility], [place] [address], [place], [place] [address]      Provider Number: [id number]  Attending Physician Name and Address:  Axel Filler, *  Relative Name and Phone Number:       Current Level of Care: Hospital Recommended Level of Care: Assisted Living Facility Prior Approval Number:    Date Approved/Denied:   PASRR Number:    Discharge Plan: Other (Comment) (ALF) [facility]    Current Diagnoses: Patient Active Problem List   Diagnosis Date Noted   Acute respiratory failure with hypoxia ([place]) 03/26/2021   Emphysema of lung ([place]) 03/26/2021   Acute congestive heart failure ([place]) 07/23/2020   Heparin allergy 07/23/2020   Obesity 11/28/2019   Hx of migraines 11/28/2019   GERD (gastroesophageal reflux disease) 11/28/2019   Stroke-like symptoms s/p IV tPA 11/28/2019   Stroke (cerebrum) ([place]) 11/27/2019   Dyslipidemia 04/30/2017   Hx of CABG x 3/stent 04/30/2017   Bilateral carotid artery stenosis 04/30/2017   Non Hodgkin's lymphoma ([place]) 04/30/2017   Hypothyroidism 04/30/2017   Hypertension 04/30/2017   S/P colostomy ([place]) 04/30/2017   Anxiety disorder 04/30/2017   Abnormal urinalysis 04/30/2017   Glaucoma 12/13/2016   Colostomy in place ([place]) 12/13/2016   Vitamin D deficiency 52/84/1324   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) 04/22/2016   Iron deficiency anemia due to chronic blood loss 08/07/2014   HTN (hypertension) 07/15/2014   Unilateral carotid artery disease ([place]) 07/15/2014   Bradycardia 07/15/2014   Colon stricture ([place]) 06/13/2014   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/12/2014   Preop cardiovascular exam 04/08/2014   NHL (non-Hodgkin's  lymphoma) (HCC) 10/18/2013   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    Orientation RESPIRATION BLADDER Height & Weight     Self, Time, Situation, Place  Normal (2L [place]) Incontinent Weight: 154 lb 8.7 oz (70.1 kg) Height:     BEHAVIORAL SYMPTOMS/MOOD NEUROLOGICAL BOWEL NUTRITION STATUS      Continent Diet (Regular Diet)  AMBULATORY STATUS COMMUNICATION OF NEEDS Skin   Supervision Verbally Normal                       Personal Care Assistance Level of Assistance  Bathing, Feeding, Dressing Bathing Assistance: Limited assistance Feeding assistance: Independent Dressing Assistance: Independent     Functional Limitations Info  Sight, Hearing, Speech Sight Info: Impaired Hearing Info: Impaired Speech Info: Adequate    SPECIAL CARE FACTORS FREQUENCY  PT (By licensed PT), OT (By licensed OT)     PT Frequency: 3x min weekly OT Frequency: 3x min weekly            Contractures Contractures Info: Not present    Additional Factors Info  Code Status, Allergies Code Status Info: Full code Allergies Info: Levaquin (levofloxacin In D5w), heparin, Beta Adrenergic Blockers, Sulfa antibiotics, Epipen (epinephrine Hcl (nasal))           TAKE these medications     amLODipine 5 MG tablet Commonly known as: NORVASC Take 1 tablet (5 mg total) by mouth daily.    Aspirin Low Dose 81 MG EC tablet Generic drug: aspirin Take 1 tablet (81 mg total) by mouth daily. Swallow whole.    atorvastatin 40 MG tablet Commonly known as:  LIPITOR Take 1 tablet (40 mg total) by mouth at bedtime.    CALCIUM 600+D3 PO Take 1 tablet by mouth at bedtime.    clopidogrel 75 MG tablet Commonly known as: PLAVIX Take 1 tablet (75 mg total) by mouth daily.    Cranberry 425 MG Caps Take 425 mg by mouth daily.    DAILY VITE PO Take 1 tablet by mouth daily. s    docusate sodium 100 MG capsule Commonly known as: COLACE Take 100 mg by mouth daily as needed for mild constipation.    furosemide  20 MG tablet Commonly known as: LASIX Take 1 tablet (20 mg total) by mouth daily.    guaiFENesin-dextromethorphan 100-10 MG/5ML syrup Commonly known as: ROBITUSSIN DM Take 5 mLs by mouth every 4 (four) hours as needed for cough.    levothyroxine 50 MCG tablet Commonly known as: SYNTHROID Take 50 mcg by mouth daily before breakfast.    lidocaine-prilocaine cream Commonly known as: EMLA Apply 1 application topically See admin instructions. Apply to port-a-cath one hour prior to procedure    nystatin cream Commonly known as: MYCOSTATIN Apply 1 application topically every 12 (twelve) hours as needed (to rash between the legs and groin).    predniSONE 20 MG tablet Commonly known as: DELTASONE Take 2 tablets (40 mg total) by mouth daily with breakfast for 1 day. Start taking on: March 30, 2021    PRESERVISION AREDS PO Take 1 tablet by mouth in the morning and at bedtime.    psyllium 58.6 % packet Commonly known as: METAMUCIL Take 1 packet by mouth every Monday, Wednesday, and Friday. ORANGE What changed: additional instructions    rOPINIRole 1 MG tablet Commonly known as: REQUIP Take 1 mg by mouth at bedtime.    sertraline 25 MG tablet Commonly known as: ZOLOFT Take 25 mg by mouth daily.    Spiriva HandiHaler 18 MCG inhalation capsule Generic drug: tiotropium Place 18 mcg into inhaler and inhale daily.    timolol 0.5 % ophthalmic solution Commonly known as: TIMOPTIC Place 1 drop into both eyes 2 (two) times daily.    trimethoprim 100 MG tablet Commonly known as: TRIMPEX Take 100 mg by mouth daily.    Vitamin D3 Super Strength 50 MCG (2000 UT) Caps Generic drug: Cholecalciferol Take 4,000 Units by mouth daily.   Relevant Imaging Results:  Relevant Lab Results:   Additional Information SSN# [id number]  Milas Gain, LCSWA

## 2021-03-29 NOTE — Progress Notes (Signed)
Room Air at rest oxygen sat = 90%  Room Air while ambulating oxygen sat = 84%  2 Liters Nasal [place] while ambulating oxygen sat = 92%

## 2021-03-29 NOTE — TOC Progression Note (Addendum)
Transition of Care [facility]) - Progression Note    Patient Details  Name: Makayla Fisher MRN: [id number] Date of Birth: [birth year]  Transition of Care [facility]) CM/SW Contact  Zenon Mayo, RN Phone Number: 03/29/2021, 12:29 PM  Clinical Narrative:    Patient will need home oxygen at discharge at ALF - [facility] in [place], [place] asked her did she have a preference of which DME company to use , she states she does not have a preference.  NCM made referral to [facility] with [employer], this will need to be delivered to [facility] before ptar takes patient back.  Will await for oxygen to be delivered and then ptar will be set up to transport.  1515- the oxygen has been delivered to the ALF , NCM notified CSW and MD.  CSW needs the dc summary to complete the discharge back to ALF.  Awaiting dc summary from MD.        Expected Discharge Plan and Services           Expected Discharge Date: 03/29/21                                     Social Determinants of Health (SDOH) Interventions    Readmission Risk Interventions No flowsheet data found.

## 2021-03-29 NOTE — Progress Notes (Signed)
Occupational Therapy Treatment Patient Details Name: Makayla Fisher MRN: [id number] DOB: 1922-04-29 Today's Date: 03/29/2021   History of present illness Makayla Fisher is a 87 y.o. female who presented 12/2 with new SOB, from  [place] assisted living facility. PMHx: CAD, CHF, Heart attack, hypoxia, anemia, TIA, prior non-Hodgkin's lymphoma in remission, colostomy due to prior bowel obstruction   OT comments  Patient continues to make progress towards goals in skilled OT session. Patient's session encompassed functional transfers and basic ADLs EOB to assess activity tolerance as well as O2 sats. Patient transitioning to EOB with supervision, however O2 at 88 on 1L. Patient placed on 2L O2 and able to demonstrate sats ranging from 90-91%. Patient transferring to chair with min guard for safety and able to maintain O2 levels between 92-94%. Patient left on 2L with RN notified. Discharge remains appropriate, therapy will continue to follow.    Recommendations for follow up therapy are one component of a multi-disciplinary discharge planning process, led by the attending physician.  Recommendations may be updated based on patient status, additional functional criteria and insurance authorization.    Follow Up Recommendations  No OT follow up    Assistance Recommended at Discharge Intermittent Supervision/Assistance  Equipment Recommendations  None recommended by OT    Recommendations for Other Services      Precautions / Restrictions Precautions Precaution Comments: watch O2 Restrictions Weight Bearing Restrictions: No       Mobility Bed Mobility Overal bed mobility: Needs Assistance Bed Mobility: Supine to Sit     Supine to sit: Supervision          Transfers Overall transfer level: Needs assistance Equipment used: Rolling walker (2 wheels) Transfers: Sit to/from Stand Sit to Stand: Supervision           General transfer comment: no safety, pt with good  awareness of lines     Balance Overall balance assessment: Needs assistance Sitting-balance support: Feet supported Sitting balance-Leahy Scale: Good     Standing balance support: No upper extremity supported;During functional activity Standing balance-Leahy Scale: Fair                             ADL either performed or assessed with clinical judgement   ADL Overall ADL's : Needs assistance/impaired     Grooming: Wash/dry hands;Wash/dry face;Brushing hair;Sitting;Set up       Lower Body Bathing: Minimal assistance;Sit to/from stand           Toilet Transfer: Min guard;Ambulation;Rolling walker (2 wheels) Toilet Transfer Details (indicate cue type and reason): simulated to recliner         Functional mobility during ADLs: Min guard;Rolling walker (2 wheels) General ADL Comments: min A for lower body peri care (pt incontinent at baseline) and cues for breathing technique throughout    Extremity/Trunk Assessment              Vision       Perception     Praxis      Cognition Arousal/Alertness: Awake/alert Behavior During Therapy: WFL for tasks assessed/performed Overall Cognitive Status: Within Functional Limits for tasks assessed                                 General Comments: pleasant, Ox4          Exercises     Shoulder Instructions       General  Comments      Pertinent Vitals/ Pain       Pain Assessment: No/denies pain  Home Living                                          Prior Functioning/Environment              Frequency           Progress Toward Goals  OT Goals(current goals can now be found in the care plan section)  Progress towards OT goals: Progressing toward goals  Acute Rehab OT Goals Patient Stated Goal: get off of oxygen and go home OT Goal Formulation: With patient Time For Goal Achievement: 03/27/21 Potential to Achieve Goals: Good  Plan Discharge plan  remains appropriate    Co-evaluation                 AM-PAC OT "6 Clicks" Daily Activity     Outcome Measure   Help from another person eating meals?: None Help from another person taking care of personal grooming?: A Little Help from another person toileting, which includes using toliet, bedpan, or urinal?: A Little Help from another person bathing (including washing, rinsing, drying)?: A Little Help from another person to put on and taking off regular upper body clothing?: A Little Help from another person to put on and taking off regular lower body clothing?: A Little 6 Click Score: 19    End of Session Equipment Utilized During Treatment: Rolling walker (2 wheels)  OT Visit Diagnosis: Unsteadiness on feet (R26.81);Muscle weakness (generalized) (M62.81)   Activity Tolerance Patient tolerated treatment well   Patient Left in chair;with call bell/phone within reach;with chair alarm set   Nurse Communication Mobility status (SpO2)        Time: 8101-7510 OT Time Calculation (min): 24 min  Charges: OT General Charges $OT Visit: 1 Visit OT Treatments $Self Care/Home Management : 23-37 mins  [place]. [place], [place] Acute Rehabilitation Services [phone] [place] 03/29/2021, 1:49 PM

## 2021-03-29 NOTE — TOC Transition Note (Signed)
Transition of Care [facility]) - CM/SW Discharge Note   Patient Details  Name: Makayla Fisher MRN: [id number] Date of Birth: [birth year]  Transition of Care [facility]) CM/SW Contact:  Milas Gain, [place] Phone Number: 03/29/2021, 3:42 PM   Clinical Narrative:     Patient will DC to: [facility]   Anticipated DC date: 03/29/2021  Family notified: [name]  Transport by: Corey Harold  ?  Per MD patient ready for DC to [facility]  . RN, patient, patient's family, and facility notified of DC. O2 delivered to ALF.Discharge Summary, FL2, and Covid result sent to facility. RN given number for report tele# [phone] ask for RCD . DC packet on chart. Ambulance transport requested for patient.  CSW signing off.   Final next level of care: Assisted Living ([facility]) Barriers to Discharge: No Barriers Identified   Patient Goals and CMS Choice Patient states their goals for this hospitalization and ongoing recovery are:: ALF CMS Medicare.gov Compare Post Acute Care list provided to:: Patient Choice offered to / list presented to : Patient  Discharge Placement              Patient chooses bed at: [facility] [place] Patient to be transferred to facility by: [place] Name of family member notified: [name] Patient and family notified of of transfer: 03/29/21  Discharge Plan and Services                                     Social Determinants of Health (SDOH) Interventions     Readmission Risk Interventions No flowsheet data found.

## 2021-03-29 NOTE — Discharge Instructions (Addendum)
Makayla Fisher  You were recently admitted to [facility] for shortness of breath. We gave you medications to help with you breathing as well as supplemental oxygen. You will be going home with oxygen for now and can check up with your primary care doctor to see if you still need this in the long term.   Continue taking your home medications with the following changes  Start taking mucinex for cough/mucous as needed every 4 hours Continue taking prednisone 40 mg daily for 1 more day. Continue using oxygen 2 L for when you are up and moving until you can check in with your primary care doctor again.    You should seek further medical care if you having worsening shortness of breath, lightheadedness, dizziness, or fever.  We recommend that you see your primary care doctor in about a week to make sure that you continue to improve. We are so glad that you are feeling better.  Sincerely, Scarlett Presto, MD

## 2021-03-29 NOTE — Progress Notes (Deleted)
Name: Makayla Fisher MRN: [id number] DOB: 07/02/1922 87 y.o. PCP: [name], DO  Date of Admission: 03/26/2021  5:44 AM Date of Discharge:   03/29/21 Attending Physician: Axel Filler, *  Discharge Diagnosis: 1. Acute Hypoxic Respiratory Failure  HFpEF  Hypertension  CAD s/p CABG Hypothyroidism Chronic Thrombocytopenia    Discharge Medications: Allergies as of 03/29/2021       Reactions   Epipen [epinephrine Hcl (nasal)] Palpitations   Levaquin [levofloxacin In D5w] Other (See Comments)   Weakness- "Allergic," per MAR   Beta Adrenergic Blockers Other (See Comments)   intolerant to higher doses of BB due to bradycardia.   Chlorhexidine Gluconate Other (See Comments)   Pt declined use and suspects she may have an intolerance   Heparin Other (See Comments)   "Alleregic," pre MAR- Maybe affected platelets, per patient   Sulfa Antibiotics Rash        Medication List     TAKE these medications    amLODipine 5 MG tablet Commonly known as: NORVASC Take 1 tablet (5 mg total) by mouth daily.   Aspirin Low Dose 81 MG EC tablet Generic drug: aspirin Take 1 tablet (81 mg total) by mouth daily. Swallow whole.   atorvastatin 40 MG tablet Commonly known as: LIPITOR Take 1 tablet (40 mg total) by mouth at bedtime.   CALCIUM 600+D3 PO Take 1 tablet by mouth at bedtime.   clopidogrel 75 MG tablet Commonly known as: PLAVIX Take 1 tablet (75 mg total) by mouth daily.   Cranberry 425 MG Caps Take 425 mg by mouth daily.   DAILY VITE PO Take 1 tablet by mouth daily. s   docusate sodium 100 MG capsule Commonly known as: COLACE Take 100 mg by mouth daily as needed for mild constipation.   furosemide 20 MG tablet Commonly known as: LASIX Take 1 tablet (20 mg total) by mouth daily.   guaiFENesin-dextromethorphan 100-10 MG/5ML syrup Commonly known as: ROBITUSSIN DM Take 5 mLs by mouth every 4 (four) hours as needed for cough.   levothyroxine 50 MCG  tablet Commonly known as: SYNTHROID Take 50 mcg by mouth daily before breakfast.   lidocaine-prilocaine cream Commonly known as: EMLA Apply 1 application topically See admin instructions. Apply to port-a-cath one hour prior to procedure   nystatin cream Commonly known as: MYCOSTATIN Apply 1 application topically every 12 (twelve) hours as needed (to rash between the legs and groin).   predniSONE 20 MG tablet Commonly known as: DELTASONE Take 2 tablets (40 mg total) by mouth daily with breakfast for 1 day. Start taking on: March 30, 2021   PRESERVISION AREDS PO Take 1 tablet by mouth in the morning and at bedtime.   psyllium 58.6 % packet Commonly known as: METAMUCIL Take 1 packet by mouth every Monday, Wednesday, and Friday. ORANGE What changed: additional instructions   rOPINIRole 1 MG tablet Commonly known as: REQUIP Take 1 mg by mouth at bedtime.   sertraline 25 MG tablet Commonly known as: ZOLOFT Take 25 mg by mouth daily.   Spiriva HandiHaler 18 MCG inhalation capsule Generic drug: tiotropium Place 18 mcg into inhaler and inhale daily.   timolol 0.5 % ophthalmic solution Commonly known as: TIMOPTIC Place 1 drop into both eyes 2 (two) times daily.   trimethoprim 100 MG tablet Commonly known as: TRIMPEX Take 100 mg by mouth daily.   Vitamin D3 Super Strength 50 MCG (2000 UT) Caps Generic drug: Cholecalciferol Take 4,000 Units by mouth daily.  Durable Medical Equipment  (From admission, onward)           Start     Ordered   03/29/21 1104  For home use only DME oxygen  Once       Question Answer Comment  Length of Need 6 Months   Frequency Continuous (stationary and portable oxygen unit needed)   Oxygen delivery system Gas      03/29/21 1103            Disposition and follow-up:   [name] was discharged from [facility] in Stable condition.  At the hospital follow up visit please  address:  1.  Acute Hypoxic Respiratory Failure - reassess dyspnea on exertion and need for supplemental oxygen. Would benefit from PFTs. Ensure patient has pulmonology follow up.   2. HFpEF- assess volume status  3. Hypothyroidism- recheck TSH  4. Chronic thrombocytopenia- established with Heme/Onc Dr. Marin Olp, ensure patient has follow up scheduled.  5.  Labs / imaging needed at time of follow-up: cbc, bmp  6.  Pending labs/ test needing follow-up: N/A  Follow-up Appointments:   Hospital Course by problem list: 1. Acute Hypoxic Respiratory Failure  Patient presented with significant wheezing and hyperinflation on CXR on the setting shortness of breath and a new oxygen requirement on exertion. Patient had no formal diagnosis of COPD in the past and did not have an extensive history of smoking so high resolution CT was obtained which showed moderate centrilobular and paraseptal emphysema. Mild, diffuse bilateral bronchial wall thickening that may reflect minimal fibrotic interstitial lung disease or alternately minimal edema. Patient was started on a 5 day course of prednisone 40 as well as home spiriva. Patient's oxygen was weaned down to 1 L but did have some desaturations with exertion and will require home oxygen. Recommend PFTs and pulmonary follow up as an outpatient.   2. HFpEF  Patient was euvolemic on exam, last TTE was in march 2022 with evidence of RWMA (in the setting of HTN emergency), however LVEF of 50-55%. Patient to continue lasix upon discharge.   3. Hypertension  Continued home amlodipine   4. CAD s/p CABG Continued home Aspirin and Clopidogrel   5. Hypothyroidism Continued home levothyroxine 50 mcg daily. Recommend outpatient follow up for TSH as recently elevated.   6. Chronic Thrombocytopenia  Platelets are stable at this time. She is already established with Heme/Onc, Dr. Marin Olp. Will recommend she follow up with him after discharge for further  evaluation  Discharge Exam:   BP (!) 145/68 (BP Location: Left Arm)   Pulse (!) 59   Temp 97.9 F (36.6 C) (Oral)   Resp 15   Wt 70.1 kg   SpO2 98%   BMI 29.20 kg/m  Discharge exam:  Gen: Elderly woman laying in bed in NAD Resp: normal WOB on 1 L Abd: soft nontender nondistended Neuro: alert and oriented x3 answering questions appropriately Skin:warm and dry Psych: normal affect  Pertinent Labs, Studies, and Procedures:   CT Chest High Resolution  Result Date: 03/26/2021 CLINICAL DATA:  Shortness of breath, suspicion for interstitial lung disease EXAM: CT CHEST WITHOUT CONTRAST TECHNIQUE: Multidetector CT imaging of the chest was performed following the standard protocol without intravenous contrast. High resolution imaging of the lungs, as well as inspiratory and expiratory imaging, was performed. COMPARISON:  CT chest abdomen pelvis, 08/19/2013, chest radiographs, 05/31/2017 FINDINGS: Cardiovascular: Right chest port catheter. Aortic atherosclerosis. Normal heart size. Three-vessel coronary artery calcifications status post median sternotomy and  CABG. No pericardial effusion. Mediastinum/Nodes: No enlarged mediastinal, hilar, or axillary lymph nodes. Thyroid gland, trachea, and esophagus demonstrate no significant findings. Lungs/Pleura: Moderate centrilobular and paraseptal emphysema. Mild, diffuse bilateral bronchial wall thickening. No significant air trapping on expiratory phase imaging. Small left, trace right pleural effusions. Mild irregular peripheral interstitial opacity and septal thickening at the lung bases, generally similar to prior examination. Upper Abdomen: No acute abnormality. Large exophytic superior pole left renal cyst in the left upper quadrant. Musculoskeletal: No chest wall mass or suspicious bone lesions identified. Unchanged wedge deformity of T8. IMPRESSION: 1. Moderate centrilobular and paraseptal emphysema. 2. Mild, diffuse bilateral bronchial wall thickening,  consistent with nonspecific infectious or inflammatory bronchitis. 3. Mild irregular peripheral interstitial opacity and septal thickening at the lung bases, generally similar to prior examination. This may reflect minimal fibrotic interstitial lung disease or alternately minimal edema associated with pleural effusions but is generally similar to prior examination dated 2015, at which time pleural effusions were also present. Please note that assessment for fibrotic interstitial lung disease in the acute setting is frequently confounded by the presence of pleural effusions and acute airspace disease and should generally be deferred to the outpatient setting. If fibrotic findings are strictly characterized by ATS pulmonary fibrosis criteria, findings are in an early "indeterminate for UIP" pattern per consensus guidelines: Diagnosis of Idiopathic Pulmonary Fibrosis: An Official ATS/ERS/JRS/ALAT Clinical Practice Guideline. [place], Iss 5, [phone], Dec 24 2016. 4. Small left, trace right pleural effusions. 5. Coronary artery disease. Aortic Atherosclerosis (ICD10-I70.0) and Emphysema (ICD10-J43.9). Electronically Signed   By: Delanna Ahmadi M.D.   On: 03/26/2021 14:45   DG Chest Port 1 View  Result Date: 03/26/2021 CLINICAL DATA:  87 year old female with shortness of breath. EXAM: PORTABLE CHEST 1 VIEW COMPARISON:  Portable chest 07/23/2020 and earlier. FINDINGS: Portable AP semi upright view at 0555 hours. Stable right chest power port. Stable sequelae of CABG, sternotomy. Calcified aortic atherosclerosis. Mediastinal contours are stable and within normal limits. There is a degree of chronic pulmonary hyperinflation. No pneumothorax, pulmonary edema or consolidation. However, small layering right pleural effusion is apparent. Osteopenia. No acute osseous abnormality identified. Paucity of bowel gas in the upper abdomen. IMPRESSION: Chronic pulmonary hyperinflation with small right  pleural effusion. Aortic Atherosclerosis (ICD10-I70.0). Electronically Signed   By: Genevie Ann M.D.   On: 03/26/2021 06:15    CBC Latest Ref Rng & Units 03/28/2021 03/26/2021 11/23/2020  WBC 4.0 - 10.5 K/uL 13.2(H) 11.1(H) 8.6  Hemoglobin 12.0 - 15.0 g/dL 12.1 13.1 13.6  Hematocrit 36.0 - 46.0 % 37.4 41.7 41.6  Platelets 150 - 400 K/uL 125(L) 126(L) 147(L)    BMP Latest Ref Rng & Units 03/26/2021 11/23/2020 07/27/2020  Glucose 70 - 99 mg/dL 128(H) 99 93  BUN 8 - 23 mg/dL 20 15 35(H)  Creatinine 0.44 - 1.00 mg/dL 1.09(H) 0.96 1.25(H)  Sodium 135 - 145 mmol/L 139 139 139  Potassium 3.5 - 5.1 mmol/L 3.7 3.7 4.2  Chloride 98 - 111 mmol/L 105 103 101  CO2 22 - 32 mmol/L 26 27 29   Calcium 8.9 - 10.3 mg/dL 9.2 9.7 8.8(L)    Discharge Instructions:   Signed: Scarlett Presto, MD 03/29/2021, 6:49 AM   Pager: [phone]

## 2021-03-29 NOTE — Progress Notes (Signed)
Patient discharging per MD order. Report called to Chiropractor at [facility]. Patient aware of discharge and return to her prior facility. Patient being discharged with home oxygen, [facility] states oxygen has been delivered and is ready at facility. Patient waiting on transport via transport [facility].

## 2021-03-29 NOTE — Progress Notes (Signed)
HD#2 Subjective:  Overnight Events: NAEO   Patient says she is anxious about going home. She does and she doesn't want to go home. She says that she is worried about being able to access a doctor at her ALF because of the care structure and she doesn't want something to go wrong. She will try and walk today and see how that goes.  Objective:  Vital signs in last 24 hours: Vitals:   03/28/21 2024 03/29/21 0309 03/29/21 0746 03/29/21 0900  BP: (!) 165/67 (!) 145/68 (!) 152/54   Pulse: 64 (!) 59 63   Resp: 17 15    Temp: 97.9 F (36.6 C) 97.9 F (36.6 C) 98.3 F (36.8 C)   TempSrc: Oral Oral Oral   SpO2: 96% 98% 94% 95%  Weight:  70.1 kg     Supplemental O2: Nasal Cannula SpO2: 95 % O2 Flow Rate (L/min): 1 L/min   Physical Exam:  Constitutional:Elderly woman resting comfortably in bed in NAD Pulmonary/Chest: normal work of breathing on 1 L [place] Abdominal: soft, non-tender Neurological: alert & oriented x 3,answering questions appropriately Skin: warm and dry Psych: normal affect  Filed Weights   03/27/21 0400 03/28/21 0316 03/29/21 0309  Weight: 71.3 kg 69.5 kg 70.1 kg     Intake/Output Summary (Last 24 hours) at 03/29/2021 1103 Last data filed at 03/29/2021 0845 Gross per 24 hour  Intake 1080 ml  Output 850 ml  Net 230 ml   Net IO Since Admission: 600.27 mL [03/29/21 1103]  Pertinent Labs: CBC Latest Ref Rng & Units 03/28/2021 03/26/2021 11/23/2020  WBC 4.0 - 10.5 K/uL 13.2(H) 11.1(H) 8.6  Hemoglobin 12.0 - 15.0 g/dL 12.1 13.1 13.6  Hematocrit 36.0 - 46.0 % 37.4 41.7 41.6  Platelets 150 - 400 K/uL 125(L) 126(L) 147(L)    CMP Latest Ref Rng & Units 03/26/2021 11/23/2020 07/27/2020  Glucose 70 - 99 mg/dL 128(H) 99 93  BUN 8 - 23 mg/dL 20 15 35(H)  Creatinine 0.44 - 1.00 mg/dL 1.09(H) 0.96 1.25(H)  Sodium 135 - 145 mmol/L 139 139 139  Potassium 3.5 - 5.1 mmol/L 3.7 3.7 4.2  Chloride 98 - 111 mmol/L 105 103 101  CO2 22 - 32 mmol/L 26 27 29   Calcium 8.9 - 10.3 mg/dL  9.2 9.7 8.8(L)  Total Protein 6.5 - 8.1 g/dL - 6.1(L) -  Total Bilirubin 0.3 - 1.2 mg/dL - 0.6 -  Alkaline Phos 38 - 126 U/L - 61 -  AST 15 - 41 U/L - 19 -  ALT 0 - 44 U/L - 11 -    Imaging: No results found.  Assessment/Plan:   Principal Problem:   Acute respiratory failure with hypoxia (HCC) Active Problems:   Colostomy in place [facility])   Anxiety disorder   Obesity   Emphysema of lung ([place])   Patient Summary: Makayla Fisher is a 87 y.o. with a past medical history of follicular large cell lymphoma s/p Rituxan in remission, CAD s/p CABG (2009), sinus bradycardia, hypertension, HFpEF, TIA, colon perforation s/p colostomy (2014) presenting to the ED with complaints of shortness of breath currently admitted for acute hypoxic respiratory failure secondary to COPD exacerbation  # Acute Hypoxic Respiratory Failure  Likely secondary to COPD exacerbation. Has emphysematous changes present on imaging since 2015. Uses spirva at home. She has never had formal PFTs. Patient desatting to 86-87 with ambulation on 1 L [place], will need home oxygen for ambulation at least for the time being.  - DME order for  home oxygen placed - Continue Prednisone 40 mg daily to complete 5 days - Continue home Spiriva - Recommend PFT and pulmonary follow up as an outpatient   # HFpEF  Most recent TTE was in March 2022, at which time there was evidence of RWMA (in the setting of HTN emergency), however LVEF of 50-55%. Remains euvolemic on exam - Continue to hold home lasix - Monitor volume status   # Hypertension  - continue home amlodipine   # CAD s/p CABG -Continue home Aspirin and Clopidogrel   # Hypothyroidism - Continue home levothyroxine 50 mcg daily - outpatient follow up for TSH as recently elevated   # Chronic Thrombocytopenia  Platelets are stable at this time. She is already established with Heme/Onc, Dr. Marin Olp. Will recommend she follow up with him after discharge for further  evaluation  Scarlett Presto, MD Internal Medicine Resident PGY-1 Pager [phone] Please contact the on call pager after 5 pm and on weekends at [phone].

## 2021-03-30 NOTE — Progress Notes (Signed)
Spoke to [employer] at [employer] regarding Code status. Pt was DNR upon arrival to ED but opted to being a Full Code( see Note by DR Charleen Kirks 12/02). Patient has been full code throughout [facility] admission.

## 2021-04-12 ENCOUNTER — Ambulatory Visit: Payer: Medicare Other | Admitting: Adult Health

## 2021-04-27 ENCOUNTER — Encounter: Payer: Self-pay | Admitting: Adult Health

## 2021-04-27 ENCOUNTER — Ambulatory Visit (INDEPENDENT_AMBULATORY_CARE_PROVIDER_SITE_OTHER): Payer: Medicare Other | Admitting: Adult Health

## 2021-04-27 VITALS — BP 154/84 | Ht 61.0 in | Wt 152.0 lb

## 2021-04-27 DIAGNOSIS — E785 Hyperlipidemia, unspecified: Secondary | ICD-10-CM

## 2021-04-27 DIAGNOSIS — R2990 Unspecified symptoms and signs involving the nervous system: Secondary | ICD-10-CM

## 2021-04-27 DIAGNOSIS — I10 Essential (primary) hypertension: Secondary | ICD-10-CM | POA: Diagnosis not present

## 2021-04-27 NOTE — Progress Notes (Signed)
[facility] [address] [place]. [place] [address] [phone]       STROKE FOLLOW UP NOTE  Makayla Fisher Date of Birth:  [birth year] Medical Record Number:  [id number]   Reason for Referral: stroke follow up    SUBJECTIVE:   CHIEF COMPLAINT:  Chief Complaint  Patient presents with   Follow-up    RM 3  Pt is well and stable, no new concerns.     HPI:   Update 04/27/2021 JM: Returns for overdue 52-month stroke follow-up.  Continues to reside at [place].  Stable from stroke standpoint -denies new or reoccurring stroke/TIA symptoms.  Compliant on Plavix and atorvastatin.  Blood pressure today 154/84 - believes elevated due to deconditioning (see below).  Hospitalized 06/2020 for NSTEMI in setting of hypertensive urgency, flash pulmonary edema and diastolic CHF and 86/5784 for acute hypoxic respiratory failure in setting of COPD exacerbation. He is now on 2L O2 via [place] - just received last week - feels deconditioned as she was not able to ambulate outside of her room as she did not yet have portable oxygen tanks. Eval by [facility] PT today and plans to start therapies. Did drop lunch tray on her ankle last week -ecchymosis with mild swelling. Some tenderness. Full ROM and able to ambulate without pain. Has not yet mentioned to facility provider.  No further concerns at this time.    History provided for reference purposes only Update 06/29/2020 JM: Makayla Fisher returns for 17-month stroke follow-up unaccompanied  She continues to reside at [facility] Stable from stroke standpoint without new or recurring stroke/TIA symptoms - denies residual deficits  Compliant on Plavix and atorvastatin -denies side effects Blood pressure today 140/80 Reports recent lab work completed by facility but unsure of results  No new concerns at this time  Initial visit 12/26/2019 JM: Makayla Fisher is being seen for hospital follow-up unaccompanied She continues to  reside at [place] She has been doing well since discharge without residual deficits and denies new or reoccurring stroke/TIA symptoms She does report increased anxiety due to fear of reoccurring stroke.  PCP recently restarted sertraline as she does have underlying history of anxiety She has been working with PT/OT at facility working on strengthening and balance Ambulates with rollator walker which she used PTA (since 2009 post heart surgery) Completed 3 weeks DAPT and remains on Plavix alone without bleeding or bruising Continues on atorvastatin 10 mg daily without myalgias  blood pressure today elevated.  Reports blood pressure typically elevated at appointments, occasionally monitors at facility which has been stable She does report increased fatigue and lack of energy but has recently received iron transfusions for known anemia as well as being treated for UTI No further concerns at this time  Stroke admission 11/27/2019 Makayla Fisher is a 88 y.o. female with history of non-Hodgkin's lymphoma, MI, iron deficiency anemia who presented on 11/27/2019 with altered mental status, right-sided weakness and abnormal speech.  Received IV TPA with NIHSS 9.  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  EEG showed intermittent left temporal slowing without evidence of seizure.  Recommended DAPT for 3 weeks then Plavix alone as previously on aspirin. Hx of HTN stable and resume lisinopril 20 mg daily.  LDL 136 and initiate atorvastatin 10 mg daily (low-dose due to advanced age).  No evidence or history of DM with A1c 5.4.  Other  stroke risk factors include advanced age, former tobacco use, obesity, hx of R brain TIA 04/2017, CAD s/p MI, CABG and stent, migraines and history of CHF (not currently present).  Other active problems include history of non-Hodgkin's lymphoma s/p chemo 2015 on maintenance retuxin, iron  deficiency anemia, GERD and colostomy.  Evaluated by therapy and recommended discharge back to ALF with home health PT/OT/SLP.  Stroke-like symptoms s/p IV tPA L brain TIA s/p tPA, possible recurrent amyloid angiopathy spells d/t small vessel disease Code Stroke CT head No acute abnormality. ASPECTS 10.    CTA head & neck no LVO. B ICA stenosis < 50%. Aortic atherosclerosis. Emphysema.  MRI  no acute infarct   2D Echo EF 60-65%. No source of embolus. Moderate LA dilation EEG intermittent L temporal lobe slowing. No sz  LDL 136 HgbA1c 5.4 VTE prophylaxis - SCDs  aspirin 81 mg daily prior to admission, resume aspirin 81. Add plavix x 3 weeks then plavix alone.  Therapy recommendations:  [place] SLP. HH PT, [place] OT  Disposition:  return to ALF       ROS:   14 system review of systems performed and negative with exception of no complaints  PMH:  Past Medical History:  Diagnosis Date   CAD (coronary artery disease)    CABG 2009   Chronic diastolic (congestive) heart failure (HCC)    GERD (gastroesophageal reflux disease)    Heart attack ([place]) 2009   Heparin allergy 07/23/2020   History of blood transfusion 2009   Hypoxia 07/23/2020   Iron deficiency anemia due to chronic blood loss 08/07/2014   Mitral regurgitation    NHL (non-Hodgkin's lymphoma) ([place]) 10/18/2013   finished chemo dec 2015, maintenanance retuxin   Sinus bradycardia    TIA (transient ischemic attack)     PSH:  Past Surgical History:  Procedure Laterality Date   c section  1947, [place]  2009   [place]  2014   Colostomy   COLOSTOMY REVISION N/A 06/12/2014   Procedure: COLOSTOMY REVISION;  Surgeon: [name], MD;  Location: [facility] ORS;  Service: General;  Laterality: N/A;   CORONARY ANGIOPLASTY WITH STENT PLACEMENT  2009   CORONARY ARTERY BYPASS GRAFT  aug 2009    x3   ESOPHAGOGASTRODUODENOSCOPY  2013   EYE SURGERY Bilateral  10 years ago   lens replacments  cataracts   right chest pac  june 2015    Social History:  Social History   Socioeconomic History   Marital status: Widowed    Spouse name: Not on file   Number of children: Not on file   Years of education: Not on file   Highest education level: Not on file  Occupational History   Not on file  Tobacco Use   Smoking status: Former    Packs/day: 1.00    Years: 25.00    Pack years: 25.00    Types: Cigarettes    Start date: 05/16/1948    Quit date: 05/17/1963    Years since quitting: 57.9   Smokeless tobacco: Never   Tobacco comments:    quit smoking 50 years ago  Vaping Use   Vaping Use: Never used  Substance and Sexual Activity   Alcohol use: No    Alcohol/week: 0.0 standard drinks   Drug use: No   Sexual activity: Not on file  Other Topics Concern   Not on file  Social History Narrative   Not on file  Social Determinants of Health   Financial Resource Strain: Not on file  Food Insecurity: Not on file  Transportation Needs: Not on file  Physical Activity: Not on file  Stress: Not on file  Social Connections: Not on file  Intimate Partner Violence: Not on file    Family History:  Family History  Problem Relation Age of Onset   Diverticulitis Mother    Heart attack Mother    Ulcers Father     Medications:   Current Outpatient Medications on File Prior to Visit  Medication Sig Dispense Refill   amLODipine (NORVASC) 5 MG tablet Take 1 tablet (5 mg total) by mouth daily. 30 tablet 2   aspirin 81 MG EC tablet Take 1 tablet (81 mg total) by mouth daily. Swallow whole. 30 tablet 11   atorvastatin (LIPITOR) 40 MG tablet Take 1 tablet (40 mg total) by mouth at bedtime. 30 tablet 30   Calcium Carb-Cholecalciferol (CALCIUM 600+D3 PO) Take 1 tablet by mouth at bedtime.     Cholecalciferol (VITAMIN D3 SUPER STRENGTH) 50 MCG (2000 UT) CAPS Take 4,000 Units by mouth daily.      clopidogrel (PLAVIX) 75 MG tablet Take 1 tablet (75 mg total) by mouth daily. 30 tablet 2    Cranberry 425 MG CAPS Take 425 mg by mouth daily.      docusate sodium (COLACE) 100 MG capsule Take 100 mg by mouth daily as needed for mild constipation.     furosemide (LASIX) 20 MG tablet Take 1 tablet (20 mg total) by mouth daily. 30 tablet 3   levothyroxine (SYNTHROID, LEVOTHROID) 50 MCG tablet Take 50 mcg by mouth daily before breakfast.      lidocaine-prilocaine (EMLA) cream Apply 1 application topically See admin instructions. Apply to port-a-cath one hour prior to procedure     Multiple Vitamin (DAILY VITE PO) Take 1 tablet by mouth daily. s     Multiple Vitamins-Minerals (PRESERVISION AREDS PO) Take 1 tablet by mouth in the morning and at bedtime.     nystatin cream (MYCOSTATIN) Apply 1 application topically every 12 (twelve) hours as needed (to rash between the legs and groin).     psyllium (METAMUCIL) 58.6 % packet Take 1 packet by mouth every Monday, Wednesday, and Friday. ORANGE     rOPINIRole (REQUIP) 1 MG tablet Take 1 mg by mouth at bedtime.     sertraline (ZOLOFT) 25 MG tablet Take 25 mg by mouth daily.     SPIRIVA HANDIHALER 18 MCG inhalation capsule Place 18 mcg into inhaler and inhale daily.      timolol (TIMOPTIC) 0.5 % ophthalmic solution Place 1 drop into both eyes 2 (two) times daily.     trimethoprim (TRIMPEX) 100 MG tablet Take 100 mg by mouth daily.     No current facility-administered medications on file prior to visit.    Allergies:   Allergies  Allergen Reactions   Epipen [Epinephrine Hcl (Nasal)] Palpitations   Levaquin [Levofloxacin In D5w] Other (See Comments)    Weakness- "Allergic," per MAR   Beta Adrenergic Blockers Other (See Comments)    intolerant to higher doses of BB due to bradycardia.   Chlorhexidine Gluconate Other (See Comments)    Pt declined use and suspects she may have an intolerance   Heparin Other (See Comments)    "Allergic," pre MAR- Maybe affected platelets, per patient Pt has tolerated heparin flush many times since 2015   Sulfa  Antibiotics Rash      OBJECTIVE:  Physical Exam  Vitals:   04/27/21 1558  BP: (!) 154/84  Weight: 152 lb (68.9 kg)  Height: 5\' 1"  (1.549 m)    Body mass index is 28.72 kg/m. No results found.  General: well developed, well nourished, very pleasant elderly Caucasian female, seated, in no evident distress with use of 2L [place] via O2 Head: head normocephalic and atraumatic.   Neck: supple with no carotid or supraclavicular bruits Cardiovascular: regular rate and rhythm, no murmurs Musculoskeletal: no deformity; full right ankle ROM, mild tenderness with palpation Skin:  no rash/petichiae; mild ecchymosis right anterior ankle  Vascular:  Normal pulses all extremities   Neurologic Exam Mental Status: Awake and fully alert.  Fluent speech and language.  Oriented to place and time. Recent and remote memory intact. Attention span, concentration and fund of knowledge appropriate. Mood and affect appropriate.  Cranial Nerves: Pupils equal, briskly reactive to light. Extraocular movements full without nystagmus. Visual fields full to confrontation. Severe HOH bilaterally despite hearing aids.  Facial sensation intact. Face, tongue, palate moves normally and symmetrically.  Motor: Normal bulk and tone. Normal strength in all tested extremity muscles. Sensory.: intact to touch , pinprick , position and vibratory sensation.  Coordination: Rapid alternating movements normal in all extremities. Finger-to-nose and heel-to-shin performed accurately bilaterally. Gait and Station: Arises from chair without difficulty. Stance is normal. Gait demonstrates normal stride length and balance with use of Rollator walker Reflexes: 1+ and symmetric. Toes downgoing.        ASSESSMENT: Makayla Fisher is a 88 y.o. year old female presented with altered mental status, right-sided weakness and abnormal speech on 11/27/2019 possibly strokelike symptoms vs TIA s/p tPA but possibly recurrent amyloid angiopathy  spells due to small vessel disease. Vascular risk factors include HTN, HLD, advanced age, former tobacco use, obesity, history of right brain TIA 2019, CAD s/p MI, CABG and stent, migraines, NSTEMI 06/2020 in setting of acute flash pulmonary edema and hypertensive urgency and new dx of COPD now requiring oxygen    PLAN:  Strokelike episode vs TIA s/p tPA:  Doing well without reoccurring or new symptoms. Continue clopidogrel 75 mg daily  and atorvastatin 10 mg daily for secondary stroke prevention. On aspirin per cardiology recommendations Discussed secondary stroke prevention measures and importance of close PCP follow up for aggressive stroke risk factor management  HTN:  BP goal <130/90.  Elevated today -typically stable per report.  Monitored by PCP/cardiology HLD:  LDL goal <70. On atorvastatin 10 mg daily. Routine monitoring at ALF - unable to view. Reports stable.     Overall stable from stroke standpoint and recommend follow-up on an as-needed basis     I spent 34 minutes of face-to-face and non-face-to-face time with patient.  This included previsit chart review, lab review, study review, electronic health record documentation, patient education regarding prior strokelike episode vs TIA s/p TPA, importance of managing stroke risk factors, right ankle concerns and recent hospitalizations and answered all other questions to patient satisfaction  Frann Rider, AGNP-BC  [facility] [address] [place], [place] [address]  Phone [phone] Fax [phone] Note: This document was prepared with digital dictation and possible smart phrase technology. Any transcriptional errors that result from this process are unintentional.

## 2021-04-27 NOTE — Patient Instructions (Addendum)
Continue clopidogrel 75 mg daily  and atorvastatin for secondary stroke prevention  Continue to follow up with PCP regarding cholesterol and blood pressure management  Maintain strict control of hypertension with blood pressure goal below 130/90 and cholesterol with LDL cholesterol (bad cholesterol) goal below 70 mg/dL.   Signs of a Stroke? Follow the BEFAST method:  Balance Watch for a sudden loss of balance, trouble with coordination or vertigo Eyes Is there a sudden loss of vision in one or both eyes? Or double vision?  Face: Ask the person to smile. Does one side of the face droop or is it numb?  Arms: Ask the person to raise both arms. Does one arm drift downward? Is there weakness or numbness of a leg? Speech: Ask the person to repeat a simple phrase. Does the speech sound slurred/strange? Is the person confused ? Time: If you observe any of these signs, call 911.  Reports dropping lunch tray on ankle last week - bruising with mild swelling - full ROM and able to ambulate on it - doubt fracture but did advise her to notify providers at facility to monitor ongoing    Overall stable from stroke standpoint - can follow up on an as needed basis         Thank you for coming to see [place] at [facility]. I hope we have been able to provide you high quality care today.  You may receive a patient satisfaction survey over the next few weeks. We would appreciate your feedback and comments so that we may continue to improve ourselves and the health of our patients.

## 2021-04-30 IMAGING — DX DG CHEST 1V PORT
1 series · 1 of 1 positions shown · non-contrast
Comparison: 05/31/2017

CLINICAL DATA: Dyspnea

EXAM:
PORTABLE CHEST 1 VIEW

[chest ap]
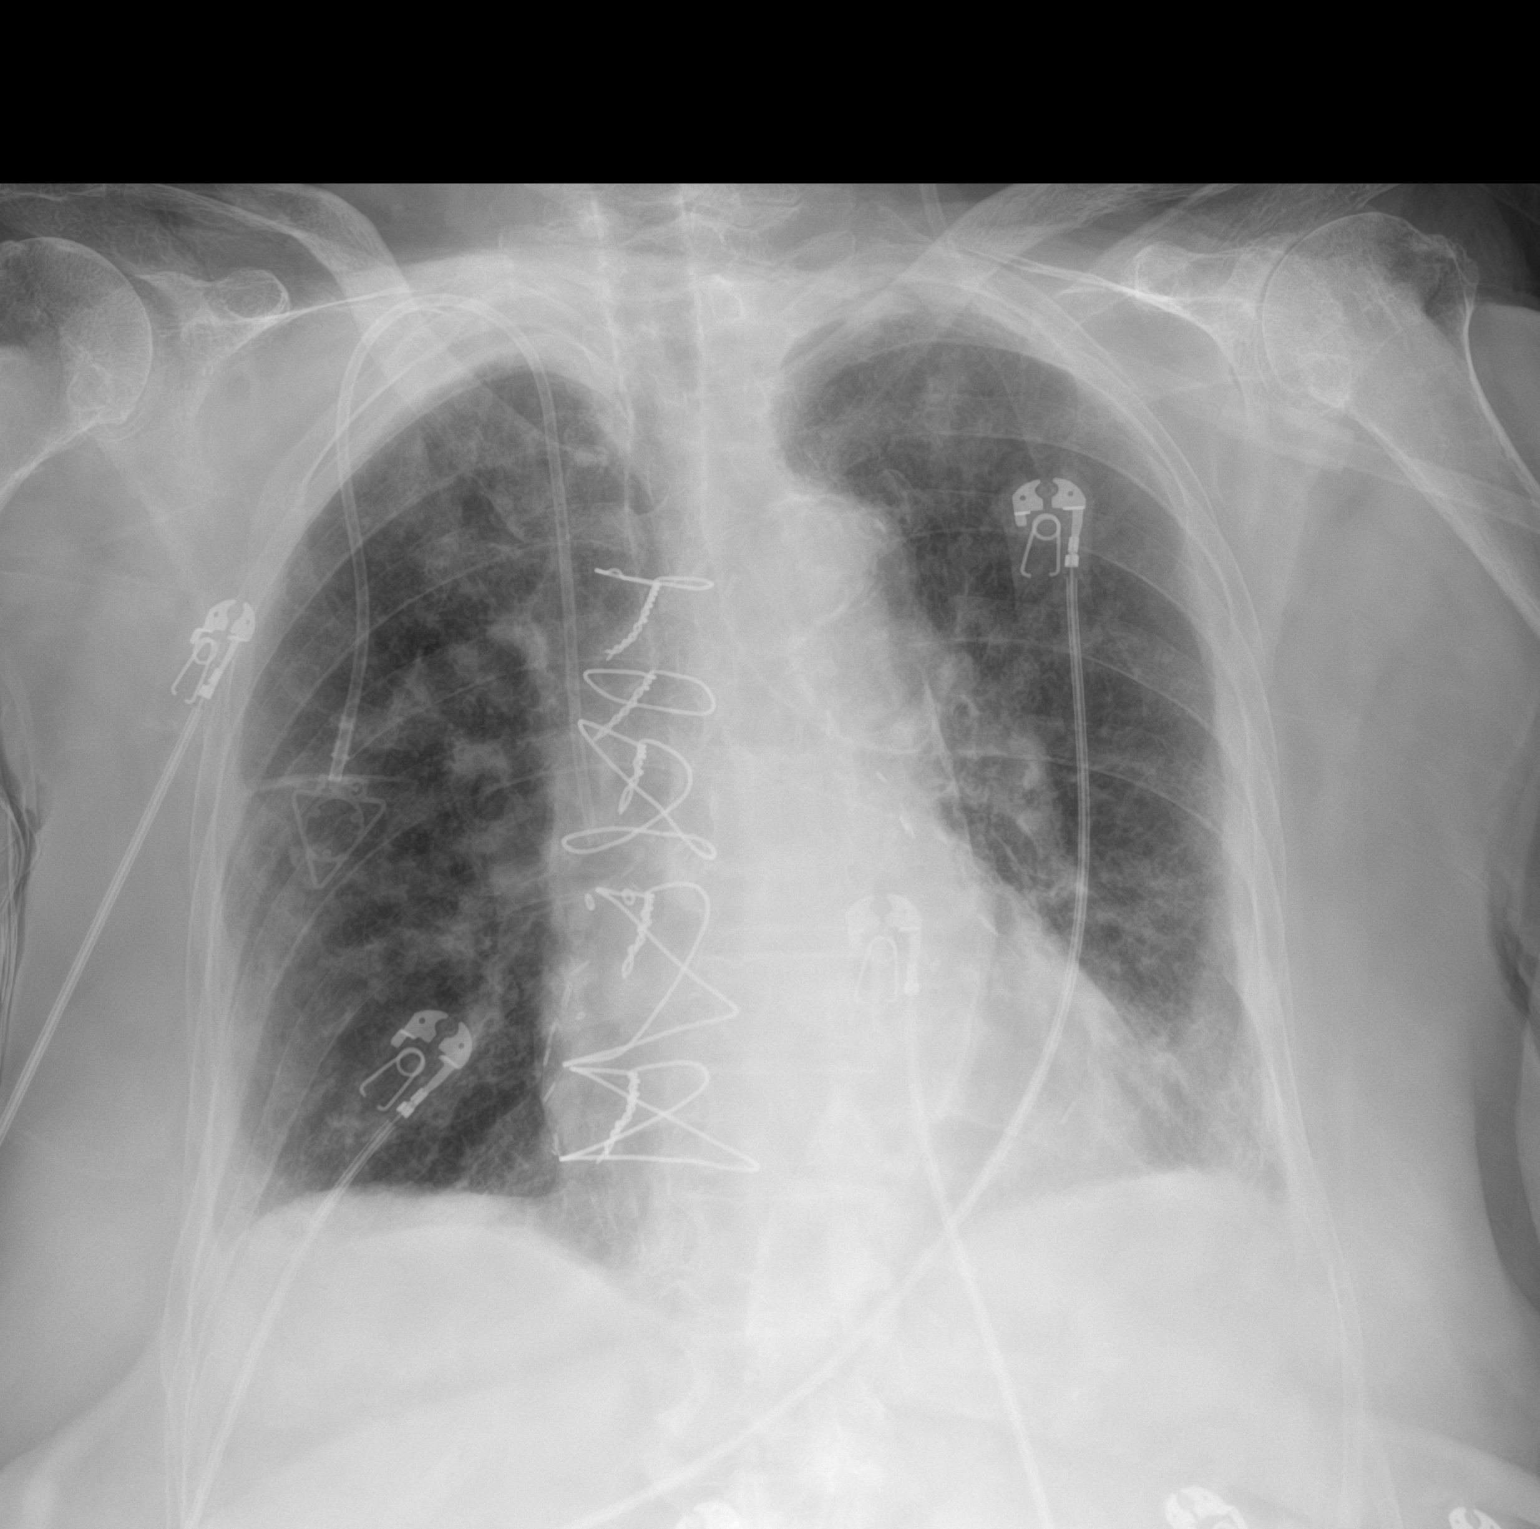

[1 of 1 positions shown; findings below may reference images not displayed]

FINDINGS: Right internal jugular central venous catheter tip is seen within
the superior vena cava. Lung volumes are small and pulmonary
insufflation has slightly diminished since prior examination.
Superimposed asymmetric patchy pulmonary infiltrates have developed
within the right lung and left lung base, likely infectious or
inflammatory in nature. No pneumothorax or pleural effusion.
Coronary artery bypass grafting has been performed. Cardiac size
within normal limits. Atherosclerotic calcification noted within a
mildly tortuous thoracic aorta. No acute bone abnormality.
IMPRESSION: Interval development of patchy bilateral asymmetric pulmonary
infiltrates, likely infectious or inflammatory in nature.

Mild pulmonary hypoinflation.  There

## 2021-05-21 ENCOUNTER — Ambulatory Visit: Payer: Medicare Other | Admitting: Cardiovascular Disease

## 2021-05-27 ENCOUNTER — Inpatient Hospital Stay (HOSPITAL_BASED_OUTPATIENT_CLINIC_OR_DEPARTMENT_OTHER): Payer: Medicare Other | Admitting: Hematology & Oncology

## 2021-05-27 ENCOUNTER — Encounter: Payer: Self-pay | Admitting: Hematology & Oncology

## 2021-05-27 ENCOUNTER — Inpatient Hospital Stay: Payer: Medicare Other

## 2021-05-27 ENCOUNTER — Inpatient Hospital Stay: Payer: Medicare Other | Attending: Hematology & Oncology

## 2021-05-27 ENCOUNTER — Other Ambulatory Visit: Payer: Self-pay

## 2021-05-27 VITALS — BP 164/59 | HR 56 | Temp 98.0°F | Resp 20 | Wt 152.5 lb

## 2021-05-27 DIAGNOSIS — E038 Other specified hypothyroidism: Secondary | ICD-10-CM

## 2021-05-27 DIAGNOSIS — Z452 Encounter for adjustment and management of vascular access device: Secondary | ICD-10-CM | POA: Insufficient documentation

## 2021-05-27 DIAGNOSIS — C8204 Follicular lymphoma grade I, lymph nodes of axilla and upper limb: Secondary | ICD-10-CM | POA: Diagnosis not present

## 2021-05-27 DIAGNOSIS — C8225 Follicular lymphoma grade III, unspecified, lymph nodes of inguinal region and lower limb: Secondary | ICD-10-CM

## 2021-05-27 DIAGNOSIS — I509 Heart failure, unspecified: Secondary | ICD-10-CM | POA: Diagnosis not present

## 2021-05-27 DIAGNOSIS — Z79899 Other long term (current) drug therapy: Secondary | ICD-10-CM | POA: Diagnosis not present

## 2021-05-27 DIAGNOSIS — D508 Other iron deficiency anemias: Secondary | ICD-10-CM | POA: Diagnosis present

## 2021-05-27 DIAGNOSIS — Z7982 Long term (current) use of aspirin: Secondary | ICD-10-CM | POA: Diagnosis not present

## 2021-05-27 DIAGNOSIS — K909 Intestinal malabsorption, unspecified: Secondary | ICD-10-CM | POA: Insufficient documentation

## 2021-05-27 DIAGNOSIS — Z95828 Presence of other vascular implants and grafts: Secondary | ICD-10-CM

## 2021-05-27 DIAGNOSIS — Z8572 Personal history of non-Hodgkin lymphomas: Secondary | ICD-10-CM | POA: Diagnosis present

## 2021-05-27 LAB — CMP (CANCER CENTER ONLY)
ALT: 12 U/L (ref 0–44)
AST: 20 U/L (ref 15–41)
Albumin: 4 g/dL (ref 3.5–5.0)
Alkaline Phosphatase: 67 U/L (ref 38–126)
Anion gap: 9 (ref 5–15)
BUN: 14 mg/dL (ref 8–23)
CO2: 30 mmol/L (ref 22–32)
Calcium: 10.1 mg/dL (ref 8.9–10.3)
Chloride: 103 mmol/L (ref 98–111)
Creatinine: 0.85 mg/dL (ref 0.44–1.00)
GFR, Estimated: >60 mL/min (ref >60)
Glucose, Bld: 93 mg/dL (ref 70–99)
Potassium: 3.6 mmol/L (ref 3.5–5.1)
Sodium: 142 mmol/L (ref 135–145)
Total Bilirubin: 0.7 mg/dL (ref 0.3–1.2)
Total Protein: 5.9 g/dL — ABNORMAL LOW (ref 6.5–8.1)

## 2021-05-27 LAB — CBC WITH DIFFERENTIAL (CANCER CENTER ONLY)
Abs Immature Granulocytes: 0.03 10*3/uL (ref 0.00–0.07)
Basophils Absolute: 0.1 10*3/uL (ref 0.0–0.1)
Basophils Relative: 1 %
Eosinophils Absolute: 0.2 10*3/uL (ref 0.0–0.5)
Eosinophils Relative: 3 %
HCT: 36.8 % (ref 36.0–46.0)
Hemoglobin: 12 g/dL (ref 12.0–15.0)
Immature Granulocytes: 0 %
Lymphocytes Relative: 24 %
Lymphs Abs: 1.8 10*3/uL (ref 0.7–4.0)
MCH: 32 pg (ref 26.0–34.0)
MCHC: 32.6 g/dL (ref 30.0–36.0)
MCV: 98.1 fL (ref 80.0–100.0)
Monocytes Absolute: 0.8 10*3/uL (ref 0.1–1.0)
Monocytes Relative: 11 %
Neutro Abs: 4.7 10*3/uL (ref 1.7–7.7)
Neutrophils Relative %: 61 %
Platelet Count: 133 10*3/uL — ABNORMAL LOW (ref 150–400)
RBC: 3.75 MIL/uL — ABNORMAL LOW (ref 3.87–5.11)
RDW: 14.5 % (ref 11.5–15.5)
WBC Count: 7.6 10*3/uL (ref 4.0–10.5)
nRBC: 0 % (ref 0.0–0.2)

## 2021-05-27 LAB — LACTATE DEHYDROGENASE: LDH: 237 U/L — ABNORMAL HIGH (ref 98–192)

## 2021-05-27 MED ORDER — HEPARIN SOD (PORK) LOCK FLUSH 100 UNIT/ML IV SOLN
500.0000 [IU] | Freq: Once | INTRAVENOUS | Status: AC
  Administered 2021-05-27: 500 [IU] via INTRAVENOUS

## 2021-05-27 MED ORDER — SODIUM CHLORIDE 0.9% FLUSH
10.0000 mL | INTRAVENOUS | Status: DC | PRN
  Administered 2021-05-27: 10 mL via INTRAVENOUS

## 2021-05-27 NOTE — Progress Notes (Signed)
Hematology and Oncology Follow Up Visit  Lacie Landry [id number] [birth year] 88 y.o. 05/27/2021   Principle Diagnosis:  Follicular large cell non-Hodgkin's lymphoma Iron deficiency anemia secondary to malabsorption  Current Therapy:   Status post cycle #8 of maintenance Rituxan - complete in June 2018 IV iron-Feraheme as indicated     Interim History:  Ms.  Stetler is back for followup.  So far, everything is going pretty well for her.  However, back in December she went to the hospital.  She was in for about 3 days.  I am not sure exactly what happened.  She says she was told that she had a congestive heart failure.  I do not see where she was put on any kind of cardiac medications.  She did not have an echocardiogram.  She did have a CT of the chest.  This may have shown some interstitial changes.  She is wearing oxygen.  She sees the cardiologist in a week or so.  When she was in the hospital, her beta natruretic peptide was 198.  We last saw her in August, her iron studies looked okay.  Her iron saturation was 36%.  I still do not think there will ever be a problem with respect to her lymphoma.  She is still very cautious with the COVID.  Her appetite is okay.  She has had no change in bowel or bladder habits.  Overall, I would say performance status is ECOG 2.    Medications:  Current Outpatient Medications:    amLODipine (NORVASC) 5 MG tablet, Take 1 tablet (5 mg total) by mouth daily., Disp: 30 tablet, Rfl: 2   aspirin 81 MG EC tablet, Take 1 tablet (81 mg total) by mouth daily. Swallow whole., Disp: 30 tablet, Rfl: 11   atorvastatin (LIPITOR) 40 MG tablet, Take 1 tablet (40 mg total) by mouth at bedtime., Disp: 30 tablet, Rfl: 30   Calcium Carb-Cholecalciferol (CALCIUM 600+D3 PO), Take 1 tablet by mouth at bedtime., Disp: , Rfl:    Cholecalciferol (VITAMIN D3 SUPER STRENGTH) 50 MCG (2000 UT) CAPS, Take 4,000 Units by mouth daily. , Disp: , Rfl:    clopidogrel  (PLAVIX) 75 MG tablet, Take 1 tablet (75 mg total) by mouth daily., Disp: 30 tablet, Rfl: 2   Cranberry 425 MG CAPS, Take 425 mg by mouth daily. , Disp: , Rfl:    docusate sodium (COLACE) 100 MG capsule, Take 100 mg by mouth daily as needed for mild constipation., Disp: , Rfl:    furosemide (LASIX) 20 MG tablet, Take 1 tablet (20 mg total) by mouth daily., Disp: 30 tablet, Rfl: 3   levothyroxine (SYNTHROID, LEVOTHROID) 50 MCG tablet, Take 50 mcg by mouth daily before breakfast. , Disp: , Rfl:    lidocaine-prilocaine (EMLA) cream, Apply 1 application topically See admin instructions. Apply to port-a-cath one hour prior to procedure, Disp: , Rfl:    Multiple Vitamin (DAILY VITE PO), Take 1 tablet by mouth daily. s, Disp: , Rfl:    Multiple Vitamins-Minerals (PRESERVISION AREDS PO), Take 1 tablet by mouth in the morning and at bedtime., Disp: , Rfl:    nystatin cream (MYCOSTATIN), Apply 1 application topically every 12 (twelve) hours as needed (to rash between the legs and groin)., Disp: , Rfl:    psyllium (METAMUCIL) 58.6 % packet, Take 1 packet by mouth every Monday, Wednesday, and Friday. ORANGE, Disp: , Rfl:    rOPINIRole (REQUIP) 1 MG tablet, Take 1 mg by mouth at bedtime., Disp: , Rfl:  sertraline (ZOLOFT) 25 MG tablet, Take 25 mg by mouth daily., Disp: , Rfl:    SPIRIVA HANDIHALER 18 MCG inhalation capsule, Place 18 mcg into inhaler and inhale daily. , Disp: , Rfl:    timolol (TIMOPTIC) 0.5 % ophthalmic solution, Place 1 drop into both eyes 2 (two) times daily., Disp: , Rfl:    trimethoprim (TRIMPEX) 100 MG tablet, Take 100 mg by mouth daily., Disp: , Rfl:   Allergies:  Allergies  Allergen Reactions   Epipen [Epinephrine Hcl (Nasal)] Palpitations   Levaquin [Levofloxacin In D5w] Other (See Comments)    Weakness- "Allergic," per MAR   Beta Adrenergic Blockers Other (See Comments)    intolerant to higher doses of BB due to bradycardia.   Chlorhexidine Gluconate Other (See Comments)    Pt  declined use and suspects she may have an intolerance   Heparin Other (See Comments)    "Allergic," pre MAR- Maybe affected platelets, per patient Pt has tolerated heparin flush many times since 2015   Sulfa Antibiotics Rash    Past Medical History, Surgical history, Social history, and Family History were reviewed and updated.  Review of Systems: Review of Systems  Constitutional: Negative.   HENT: Negative.    Eyes: Negative.   Respiratory:  Positive for shortness of breath.   Cardiovascular:  Positive for palpitations.  Gastrointestinal:  Positive for diarrhea and nausea.  Genitourinary: Negative.   Musculoskeletal:  Positive for joint pain and myalgias.  Skin: Negative.   Neurological:  Positive for tingling.  Endo/Heme/Allergies: Negative.   Psychiatric/Behavioral: Negative.      Physical Exam:  weight is 152 lb 8 oz (69.2 kg). Her oral temperature is 98 F (36.7 C). Her blood pressure is 164/59 (abnormal) and her pulse is 56 (abnormal). Her respiration is 20 and oxygen saturation is 100%.   Physical Exam Vitals reviewed.  HENT:     Head: Normocephalic and atraumatic.  Eyes:     Pupils: Pupils are equal, round, and reactive to light.  Cardiovascular:     Rate and Rhythm: Normal rate and regular rhythm.     Heart sounds: Normal heart sounds.  Pulmonary:     Effort: Pulmonary effort is normal.     Breath sounds: Normal breath sounds.  Abdominal:     General: Bowel sounds are normal.     Palpations: Abdomen is soft.     Comments: Abdominal exam shows a colostomy in the left lower quadrant.  She has multiple surgical scars.  She has no fluid wave.  There is no guarding or rebound tenderness.  There is no palpable liver or spleen tip.  Musculoskeletal:        General: No tenderness or deformity. Normal range of motion.     Cervical back: Normal range of motion.  Lymphadenopathy:     Cervical: No cervical adenopathy.  Skin:    General: Skin is warm and dry.      Findings: No erythema or rash.  Neurological:     Mental Status: She is alert and oriented to person, place, and time.  Psychiatric:        Behavior: Behavior normal.        Thought Content: Thought content normal.        Judgment: Judgment normal.     Lab Results  Component Value Date   WBC 13.2 (H) 03/28/2021   HGB 12.1 03/28/2021   HCT 37.4 03/28/2021   MCV 97.4 03/28/2021   PLT 125 (L) 03/28/2021  Chemistry      Component Value Date/Time   NA 139 03/26/2021 0642   NA 143 01/31/2017 0931   NA 141 05/08/2014 1041   K 3.7 03/26/2021 0642   K 3.7 01/31/2017 0931   K 4.5 05/08/2014 1041   CL 105 03/26/2021 0642   CL 106 01/31/2017 0931   CO2 26 03/26/2021 0642   CO2 27 01/31/2017 0931   CO2 24 05/08/2014 1041   BUN 20 03/26/2021 0642   BUN 10 01/31/2017 0931   BUN 14.2 05/08/2014 1041   CREATININE 1.09 (H) 03/26/2021 0642   CREATININE 0.96 11/23/2020 1331   CREATININE 0.9 01/31/2017 0931   CREATININE 0.8 05/08/2014 1041      Component Value Date/Time   CALCIUM 9.2 03/26/2021 0642   CALCIUM 9.7 01/31/2017 0931   CALCIUM 9.2 05/08/2014 1041   ALKPHOS 61 11/23/2020 1331   ALKPHOS 57 01/31/2017 0931   ALKPHOS 66 05/08/2014 1041   AST 19 11/23/2020 1331   AST 23 05/08/2014 1041   ALT 11 11/23/2020 1331   ALT 14 01/31/2017 0931   ALT 10 05/08/2014 1041   BILITOT 0.6 11/23/2020 1331   BILITOT 0.42 05/08/2014 1041      Impression and Plan: Ms. Wunder is 88 year old female. She has a follicular large cell lymphoma.  It has been 4 years since she had completion of her treatment with respect to maintenance Rituxan.  I really think she is cured.  She likes to come back to see [place].  She likes the fact that we do do a thorough exam on her.  We will see what her iron studies look like.  Again, she is follow-up with cardiology for this heart issue.  Again I am unsure if there really is a heart issue.  She also says that she has a appointment with a  pulmonologist.  I am sure that the oxygen is helping her.  We will plan to get her back in 6 months.  She was likes to see [place].  She still has the Port-A-Cath in.  We had to make sure this is flushed every 2 months.   [name], MD 2/2/20231:50 PM

## 2021-05-28 ENCOUNTER — Telehealth: Payer: Self-pay | Admitting: *Deleted

## 2021-05-28 LAB — IRON AND IRON BINDING CAPACITY (CC-WL,HP ONLY)
Iron: 72 ug/dL (ref 28–170)
Saturation Ratios: 28 % (ref 10.4–31.8)
TIBC: 256 ug/dL (ref 250–450)
UIBC: 184 ug/dL (ref 148–442)

## 2021-05-28 LAB — FERRITIN: Ferritin: 240 ng/mL (ref 11–307)

## 2021-05-28 LAB — TSH: TSH: 4.344 u[IU]/mL — ABNORMAL HIGH (ref 0.308–3.960)

## 2021-05-28 NOTE — Telephone Encounter (Signed)
-----   Message from [name], MD sent at 05/28/2021  1:26 PM EST ----- Please call her and let her know that the iron level and the thyroid levels are fine.  Makayla Fisher

## 2021-05-28 NOTE — Telephone Encounter (Signed)
Message left to inform patient that the iron level and the thyroid levels are fine per order of Dr. Marin Olp.  Instructed pt to call office back with any questions or concerns.

## 2021-05-30 NOTE — Progress Notes (Signed)
Date:  06/07/2021   ID:  Makayla Fisher, DOB 07-01-1922, MRN [id number]  PCP:  Jacelyn Pi, NP  Cardiologist:   Johnsie Cancel Electrophysiologist:  None   Evaluation Performed:  Follow-Up Visit  Chief Complaint:  CAD  History of Present Illness:    88 y.o. .  Initially seen  for preop clearance in 3299  Has follicular large cell lymphoma of abdomen.   Distant history of CAD  With stent /CABG in [facility]  Has had bradycardia on higher dose beta blocker    Echo 07/23/20 reviewed:  EF 50-55% Mild/moderate MR   06/13/15   Had colostomy revision for stricture with slow recovery but no cardiac complications    Sees Dr Marin Olp has maintenance Rituxan completed 09/2016 and gets IV iron for malabsorption    Memory issues and statin stopped   Admitted to [facility] 12/2-08/2020 with hypoxic respiratory failure Extensive smoking history CT with emphysema fibrosis Rx with prednisone and spiriva Required 1 L oxgygen and told to have f/u with pulmonary with PFTls Rx with some lasix with TTE EF 50-55% BNP only 198 Respiratory panel negative  D/c with amlodipine for BP and 20 mg lasix daily Not clear why she is on both ASA/Plavix given age and chronic thrombocytopenia followed by Dr Marin Olp PLT count on d/c 133    Needs to see pulmonary wearing oxygen 24/7 No cardiac complaints Discussed stopping plavix given low PLT count    Past Medical History:  Diagnosis Date   CAD (coronary artery disease)    CABG 2009   Chronic diastolic (congestive) heart failure (HCC)    GERD (gastroesophageal reflux disease)    Heart attack ([place]) 2009   Heparin allergy 07/23/2020   History of blood transfusion 2009   Hypoxia 07/23/2020   Iron deficiency anemia due to chronic blood loss 08/07/2014   Mitral regurgitation    NHL (non-Hodgkin's lymphoma) ([place]) 10/18/2013   finished chemo dec 2015, maintenanance retuxin   Sinus bradycardia    TIA (transient ischemic attack)    Past  Surgical History:  Procedure Laterality Date   c section  1947, [place]  2009   [place]  2014   Colostomy   COLOSTOMY REVISION N/A 06/12/2014   Procedure: COLOSTOMY REVISION;  Surgeon: [name], MD;  Location: [facility];  Service: General;  Laterality: N/A;   CORONARY ANGIOPLASTY WITH STENT PLACEMENT  2009   CORONARY ARTERY BYPASS GRAFT  aug 2009    x3   ESOPHAGOGASTRODUODENOSCOPY  2013   EYE SURGERY Bilateral  10 years ago   lens replacments cataracts   right chest pac  june 2015     No outpatient medications have been marked as taking for the 06/07/21 encounter (Appointment) with Josue Hector, MD.     Allergies:   Epipen [epinephrine hcl (nasal)], Levaquin [levofloxacin in d5w], Beta adrenergic blockers, Chlorhexidine gluconate, Heparin, and Sulfa antibiotics   Social History   Tobacco Use   Smoking status: Former    Packs/day: 1.00    Years: 25.00    Pack years: 25.00    Types: Cigarettes    Start date: 05/16/1948    Quit date: 05/17/1963    Years since quitting: 58.0   Smokeless tobacco: Never   Tobacco comments:    quit smoking 50 years ago  Vaping Use   Vaping Use: Never used  Substance Use Topics   Alcohol use: No  Alcohol/week: 0.0 standard drinks   Drug use: No     Family Hx: The patient's family history includes Diverticulitis in her mother; Heart attack in her mother; Ulcers in her father.  ROS:   Please see the history of present illness.     All other systems reviewed and are negative.   Prior CV studies:   The following studies were reviewed today:  Echo 05/01/18   Labs/Other Tests and Data Reviewed:    EKG: 07/16/19 SR rate 77 low voltage   Recent Labs: 03/26/2021: B Natriuretic Peptide 198.4 05/27/2021: ALT 12; BUN 14; Creatinine 0.85; Hemoglobin 12.0; Platelet Count 133; Potassium 3.6; Sodium 142; TSH 4.344   Recent Lipid Panel Lab Results  Component Value Date/Time   CHOL 213 (H)  11/27/2019 05:47 AM   TRIG 97 11/27/2019 05:47 AM   HDL 58 11/27/2019 05:47 AM   CHOLHDL 3.7 11/27/2019 05:47 AM   LDLCALC 136 (H) 11/27/2019 05:47 AM    Wt Readings from Last 3 Encounters:  05/27/21 152 lb 8 oz (69.2 kg)  04/27/21 152 lb (68.9 kg)  03/29/21 154 lb 8.7 oz (70.1 kg)     Objective:    Vital Signs:  There were no vitals taken for this visit.   Affect appropriate Elderly female on oxygen HEENT: normal Neck supple with no adenopathy JVP normal no bruits no thyromegaly Lungs rhonchi exp wheezing  Heart:  S1/S2 MR  murmur, no rub, gallop or click PMI normal Abdomen: benighn, BS positve, post colectomy  Distal pulses intact with no bruits No edema Neuro non-focal Skin warm and dry No muscular weakness   ASSESSMENT & PLAN:    CAD:  Stable with no angina and good activity level.  Continue medical Rx Distant CABG in 2009 ASA no plavix   HTN:  Well controlled.  Continue current medications and low sodium Dash type diet.    Chol:  Statin stopped due to memory issues and age    GI:  Good result with colostomy revision no cardiac complication with surgery   Bradycardia:  Improved off beta blocker no indication for pacer   Carotid:  Reviewed duplex 05/01/17  1-39% ICA bilateral no routine f/u scanning   Murmur: Mild to moderate MR stable by echo 07/23/20   Hematology:  F/U ennever history of follicular large cell lymphoma and anemia continue Rituxan and Fereheme Encouraged her to get port flushed regularly D/c plavix with chronic thrombocytopenia PLT 133  COPD/Respiratory Failure:  f/u pulmonary with mild ILD and moderate emphysema Hospitalized with exacerbation and oxygen requirement   Diastolic CHF:  diagnosis not clear with recent dyspnea from COPD exacerbation EF low normal MR mild / mod BNP only 198 continue current lasix dose       Medication Adjustments/Labs and Tests Ordered: Current medicines are reviewed at length with the patient today.  Concerns  regarding medicines are outlined above.   Tests Ordered:  None   Medication Changes:  D/c Plavix Refer to Pulmonary   Disposition:  Follow up in  A year   Signed, Jenkins Rouge, MD  06/07/2021 2:47 PM    [place]

## 2021-06-07 ENCOUNTER — Ambulatory Visit (INDEPENDENT_AMBULATORY_CARE_PROVIDER_SITE_OTHER): Payer: Medicare Other | Admitting: Cardiovascular Disease

## 2021-06-07 ENCOUNTER — Encounter: Payer: Self-pay | Admitting: Cardiovascular Disease

## 2021-06-07 ENCOUNTER — Other Ambulatory Visit: Payer: Self-pay

## 2021-06-07 VITALS — BP 142/76 | HR 66 | Ht 61.0 in | Wt 151.0 lb

## 2021-06-07 DIAGNOSIS — I2510 Atherosclerotic heart disease of native coronary artery without angina pectoris: Secondary | ICD-10-CM

## 2021-06-07 DIAGNOSIS — J449 Chronic obstructive pulmonary disease, unspecified: Secondary | ICD-10-CM

## 2021-06-07 DIAGNOSIS — R001 Bradycardia, unspecified: Secondary | ICD-10-CM

## 2021-06-07 DIAGNOSIS — D696 Thrombocytopenia, unspecified: Secondary | ICD-10-CM

## 2021-06-07 DIAGNOSIS — I340 Nonrheumatic mitral (valve) insufficiency: Secondary | ICD-10-CM | POA: Diagnosis not present

## 2021-06-07 NOTE — Patient Instructions (Addendum)
Medication Instructions:  Your physician has recommended you make the following change in your medication:  STOP Plavix  *If you need a refill on your cardiac medications before your next appointment, please call your pharmacy*  Lab Work: If you have labs (blood work) drawn today and your tests are completely normal, you will receive your results only by: [place] (if you have MyChart) OR A paper copy in the mail If you have any lab test that is abnormal or we need to change your treatment, we will call you to review the results.  Testing/Procedures: None ordered today.  Follow-Up: At [facility], you and your health needs are our priority.  As part of our continuing mission to provide you with exceptional heart care, we have created designated Provider Care Teams.  These Care Teams include your primary Cardiologist (physician) and Advanced Practice Providers (APPs -  Physician Assistants and Nurse Practitioners) who all work together to provide you with the care you need, when you need it.  We recommend signing up for the patient portal called "MyChart".  Sign up information is provided on this After Visit Summary.  MyChart is used to connect with patients for Virtual Visits (Telemedicine).  Patients are able to view lab/test results, encounter notes, upcoming appointments, etc.  Non-urgent messages can be sent to your provider as well.   To learn more about what you can do with MyChart, go to [email or web address].    Your next appointment:   1 year(s)  The format for your next appointment:   In Person  Provider:   Jenkins Rouge, MD {  You have been referred to Pulmonology

## 2021-06-17 ENCOUNTER — Encounter: Payer: Self-pay | Admitting: *Deleted

## 2021-07-09 ENCOUNTER — Emergency Department (HOSPITAL_COMMUNITY): Payer: Medicare Other

## 2021-07-09 ENCOUNTER — Observation Stay (HOSPITAL_COMMUNITY)
Admission: EM | Admit: 2021-07-09 | Discharge: 2021-07-10 | Disposition: A | Discharge status: Skilled Nursing Facility | Payer: Medicare Other | Attending: Family Medicine | Admitting: Family Medicine

## 2021-07-09 ENCOUNTER — Encounter (HOSPITAL_COMMUNITY): Payer: Self-pay | Admitting: Emergency Medicine

## 2021-07-09 ENCOUNTER — Other Ambulatory Visit: Payer: Self-pay

## 2021-07-09 DIAGNOSIS — Z9981 Dependence on supplemental oxygen: Secondary | ICD-10-CM

## 2021-07-09 DIAGNOSIS — I5032 Chronic diastolic (congestive) heart failure: Secondary | ICD-10-CM | POA: Diagnosis not present

## 2021-07-09 DIAGNOSIS — Z20822 Contact with and (suspected) exposure to covid-19: Secondary | ICD-10-CM | POA: Insufficient documentation

## 2021-07-09 DIAGNOSIS — Z888 Allergy status to other drugs, medicaments and biological substances status: Secondary | ICD-10-CM

## 2021-07-09 DIAGNOSIS — Z79899 Other long term (current) drug therapy: Secondary | ICD-10-CM | POA: Insufficient documentation

## 2021-07-09 DIAGNOSIS — K219 Gastro-esophageal reflux disease without esophagitis: Secondary | ICD-10-CM | POA: Diagnosis present

## 2021-07-09 DIAGNOSIS — Z955 Presence of coronary angioplasty implant and graft: Secondary | ICD-10-CM | POA: Insufficient documentation

## 2021-07-09 DIAGNOSIS — E669 Obesity, unspecified: Secondary | ICD-10-CM | POA: Diagnosis present

## 2021-07-09 DIAGNOSIS — I2510 Atherosclerotic heart disease of native coronary artery without angina pectoris: Secondary | ICD-10-CM | POA: Insufficient documentation

## 2021-07-09 DIAGNOSIS — E876 Hypokalemia: Secondary | ICD-10-CM | POA: Diagnosis present

## 2021-07-09 DIAGNOSIS — I110 Hypertensive heart disease with heart failure: Secondary | ICD-10-CM | POA: Insufficient documentation

## 2021-07-09 DIAGNOSIS — J449 Chronic obstructive pulmonary disease, unspecified: Secondary | ICD-10-CM | POA: Diagnosis present

## 2021-07-09 DIAGNOSIS — R0602 Shortness of breath: Secondary | ICD-10-CM | POA: Diagnosis present

## 2021-07-09 DIAGNOSIS — Z933 Colostomy status: Secondary | ICD-10-CM | POA: Diagnosis not present

## 2021-07-09 DIAGNOSIS — Z87891 Personal history of nicotine dependence: Secondary | ICD-10-CM | POA: Insufficient documentation

## 2021-07-09 DIAGNOSIS — Z8673 Personal history of transient ischemic attack (TIA), and cerebral infarction without residual deficits: Secondary | ICD-10-CM | POA: Diagnosis not present

## 2021-07-09 DIAGNOSIS — I10 Essential (primary) hypertension: Secondary | ICD-10-CM | POA: Diagnosis present

## 2021-07-09 DIAGNOSIS — Z951 Presence of aortocoronary bypass graft: Secondary | ICD-10-CM

## 2021-07-09 DIAGNOSIS — Z7982 Long term (current) use of aspirin: Secondary | ICD-10-CM | POA: Diagnosis not present

## 2021-07-09 DIAGNOSIS — J441 Chronic obstructive pulmonary disease with (acute) exacerbation: Secondary | ICD-10-CM | POA: Diagnosis not present

## 2021-07-09 DIAGNOSIS — E039 Hypothyroidism, unspecified: Secondary | ICD-10-CM | POA: Diagnosis not present

## 2021-07-09 DIAGNOSIS — J9611 Chronic respiratory failure with hypoxia: Secondary | ICD-10-CM | POA: Diagnosis not present

## 2021-07-09 LAB — CBC WITH DIFFERENTIAL/PLATELET
Abs Immature Granulocytes: 0.12 10*3/uL — ABNORMAL HIGH (ref 0.00–0.07)
Basophils Absolute: 0 10*3/uL (ref 0.0–0.1)
Basophils Relative: 0 %
Eosinophils Absolute: 0 10*3/uL (ref 0.0–0.5)
Eosinophils Relative: 0 %
HCT: 35.1 % — ABNORMAL LOW (ref 36.0–46.0)
Hemoglobin: 11.4 g/dL — ABNORMAL LOW (ref 12.0–15.0)
Immature Granulocytes: 1 %
Lymphocytes Relative: 7 %
Lymphs Abs: 1.2 10*3/uL (ref 0.7–4.0)
MCH: 31.9 pg (ref 26.0–34.0)
MCHC: 32.5 g/dL (ref 30.0–36.0)
MCV: 98.3 fL (ref 80.0–100.0)
Monocytes Absolute: 1.5 10*3/uL — ABNORMAL HIGH (ref 0.1–1.0)
Monocytes Relative: 9 %
Neutro Abs: 13.4 10*3/uL — ABNORMAL HIGH (ref 1.7–7.7)
Neutrophils Relative %: 83 %
Platelets: 110 10*3/uL — ABNORMAL LOW (ref 150–400)
RBC: 3.57 MIL/uL — ABNORMAL LOW (ref 3.87–5.11)
RDW: 13.8 % (ref 11.5–15.5)
WBC: 16.3 10*3/uL — ABNORMAL HIGH (ref 4.0–10.5)
nRBC: 0 % (ref 0.0–0.2)

## 2021-07-09 LAB — COMPREHENSIVE METABOLIC PANEL
ALT: 15 U/L (ref 0–44)
AST: 24 U/L (ref 15–41)
Albumin: 3.3 g/dL — ABNORMAL LOW (ref 3.5–5.0)
Alkaline Phosphatase: 63 U/L (ref 38–126)
Anion gap: 11 (ref 5–15)
BUN: 14 mg/dL (ref 8–23)
CO2: 28 mmol/L (ref 22–32)
Calcium: 9.5 mg/dL (ref 8.9–10.3)
Chloride: 98 mmol/L (ref 98–111)
Creatinine, Ser: 1.07 mg/dL — ABNORMAL HIGH (ref 0.44–1.00)
GFR, Estimated: 48 mL/min — ABNORMAL LOW (ref >60)
Glucose, Bld: 136 mg/dL — ABNORMAL HIGH (ref 70–99)
Potassium: 3.2 mmol/L — ABNORMAL LOW (ref 3.5–5.1)
Sodium: 137 mmol/L (ref 135–145)
Total Bilirubin: 1 mg/dL (ref 0.3–1.2)
Total Protein: 5.7 g/dL — ABNORMAL LOW (ref 6.5–8.1)

## 2021-07-09 LAB — BRAIN NATRIURETIC PEPTIDE: B Natriuretic Peptide: 383.2 pg/mL — ABNORMAL HIGH (ref 0.0–100.0)

## 2021-07-09 MED ORDER — AZITHROMYCIN 250 MG PO TABS
500.0000 mg | ORAL_TABLET | Freq: Once | ORAL | Status: AC
  Administered 2021-07-09: 500 mg via ORAL
  Filled 2021-07-09: qty 2

## 2021-07-09 MED ORDER — METHYLPREDNISOLONE SODIUM SUCC 125 MG IJ SOLR
125.0000 mg | Freq: Once | INTRAMUSCULAR | Status: AC
  Administered 2021-07-09: 125 mg via INTRAVENOUS
  Filled 2021-07-09: qty 2

## 2021-07-09 MED ORDER — SODIUM CHLORIDE 0.9 % IV SOLN
1.0000 g | Freq: Once | INTRAVENOUS | Status: AC
  Administered 2021-07-09: 1 g via INTRAVENOUS
  Filled 2021-07-09: qty 10

## 2021-07-09 MED ORDER — POTASSIUM CHLORIDE 20 MEQ PO PACK: 40.0000 meq | PACK | Freq: Two times a day (BID) | ORAL | Status: DC

## 2021-07-09 MED ORDER — IPRATROPIUM-ALBUTEROL 0.5-2.5 (3) MG/3ML IN SOLN
3.0000 mL | Freq: Once | RESPIRATORY_TRACT | Status: AC
  Administered 2021-07-09: 3 mL via RESPIRATORY_TRACT
  Filled 2021-07-09: qty 3

## 2021-07-09 MED ORDER — POTASSIUM CHLORIDE 20 MEQ PO PACK
40.0000 meq | PACK | ORAL | Status: AC
  Administered 2021-07-10 (×2): 40 meq via ORAL
  Filled 2021-07-09 (×2): qty 2

## 2021-07-09 NOTE — H&P (Signed)
?History and Physical  ? ? ?Makayla Fisher ERX:[id number] DOB: 09-10-1922 DOA: 07/09/2021 ? ?DOS: the patient was seen and examined on 07/09/2021 ? ?PCP: Jacelyn Pi, NP  ? ?Patient coming from: [place] ? ?I have personally briefly reviewed patient's old medical records in [place] ? ?CC: SOB ?HPI: ?88 year old white female with a history of COPD, chronic hypoxic respiratory failure on 2 L of oxygen continuously, history of colostomy, hypertension, obesity, reflux, history of follicular large cell lymphoma that is now considered cured per oncology, history of heparin allergy (but tolerates heparin flushes through her Port-A-Cath), Presents to the ER with 2 to 3 days of worsening shortness of breath.  Patient states that she has been short of breath for the last couple days.  She has been in bed.  She states that delivery device for her Spiriva does not work as well.  She claims that it is now in a larger container.  She is no longer getting the Spiriva capsules that are punctured. ? ?She states that she does not feel like she is getting enough oxygen.  She wants to turn her oxygen concentrator up to 3 L but was told by assisted-living that this is not allowed.  She was brought to the ER for evaluation. ? ?She denies any fever or chills. ? ?On arrival, temp 99.5 heart rate 88 blood pressure 173/49 satting 98% on 3 L. ? ?Work-up in the ER white count 16.3, hemoglobin 11.4, platelets 110 ? ?Sodium 137 potassium 3.2 chloride of 98 BUN of 14 creatinine 1.0 ? ?BNP 383 ? ?Chest x-ray demonstrates asymmetric infiltrates in the left lung either infectious or inflammatory. ? ?Patient declined to go home. ? ?[facility] hospitalist contacted for admission.  ? ?ED Course: given solumedrol, nebs, pt refused to be discharged back to ALF. ? ?Review of Systems:  ?Review of Systems  ?Constitutional:  Negative for chills and fever.  ?HENT: Negative.    ?Eyes: Negative.   ?Respiratory:  Positive for cough and  shortness of breath.   ?Cardiovascular: Negative.   ?Gastrointestinal: Negative.   ?Musculoskeletal: Negative.   ?Skin: Negative.   ?Neurological: Negative.   ?Endo/Heme/Allergies: Negative.   ?Psychiatric/Behavioral: Negative.    ?All other systems reviewed and are negative. ? ?Past Medical History:  ?Diagnosis Date  ? Bradycardia 07/15/2014  ? CAD (coronary artery disease)   ? CABG 2009  ? Chronic diastolic (congestive) heart failure (HCC)   ? GERD (gastroesophageal reflux disease)   ? Heart attack [facility]) 2009  ? Heparin allergy 07/23/2020  ? History of blood transfusion 2009  ? Hypoxia 07/23/2020  ? Iron deficiency anemia due to chronic blood loss 08/07/2014  ? Mitral regurgitation   ? NHL (non-Hodgkin's lymphoma) ([place]) 10/18/2013  ? finished chemo dec 2015, maintenanance retuxin  ? Non Hodgkin's lymphoma ([place]) 04/30/2017  ? Sinus bradycardia   ? 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 Stroke-like symptoms s/p IV tPA 11/28/2019  ? TIA (transient ischemic attack)   ? ? ?Past Surgical History:  ?Procedure Laterality Date  ? c section  1947, 1963  ? CARDIAC SURGERY  2009  ? CHOLECYSTECTOMY  1973  ? COLON SURGERY  2014  ? Colostomy  ? COLOSTOMY REVISION N/A 06/12/2014  ? Procedure: COLOSTOMY REVISION;  Surgeon: [name], MD;  Location: [facility];  Service: General;  Laterality: N/A;  ? CORONARY ANGIOPLASTY WITH STENT PLACEMENT  2009  ? CORONARY ARTERY BYPASS GRAFT  aug 2009  ?  x3  ? ESOPHAGOGASTRODUODENOSCOPY  2013  ? EYE SURGERY Bilateral  10 years ago  ? lens replacments cataracts  ? right chest pac  june 2015  ? ? ? reports that she quit smoking about 58 years ago. Her smoking use included cigarettes. She started smoking about 73 years ago. She has a 25.00 pack-year smoking history. She has never used smokeless tobacco. She reports that she does not drink alcohol and does not use drugs. ? ?Allergies  ?Allergen Reactions  ? Epipen [Epinephrine Hcl (Nasal)] Palpitations  ? Levaquin [Levofloxacin In D5w] Other (See Comments)   ?  Weakness- "Allergic," per MAR  ? Beta Adrenergic Blockers Other (See Comments)  ?  intolerant to higher doses of BB due to bradycardia.  ? Chlorhexidine Gluconate Other (See Comments)  ?  Pt declined use and suspects she may have an intolerance  ? Heparin Other (See Comments)  ?  "Allergic," pre MAR- Maybe affected platelets, per patient ?Pt has tolerated heparin flush many times since 2015  ? Sulfa Antibiotics Rash  ? ? ?Family History  ?Problem Relation Age of Onset  ? Diverticulitis Mother   ? Heart attack Mother   ? Ulcers Father   ? ? ?Prior to Admission medications   ?Medication Sig Start Date End Date Taking? Authorizing Provider  ?amLODipine (NORVASC) 5 MG tablet Take 1 tablet (5 mg total) by mouth daily. 07/28/20   Rai, Vernelle Emerald, MD  ?aspirin 81 MG EC tablet Take 1 tablet (81 mg total) by mouth daily. Swallow whole. 07/28/20   Rai, Vernelle Emerald, MD  ?atorvastatin (LIPITOR) 40 MG tablet Take 1 tablet (40 mg total) by mouth at bedtime. 07/27/20   Rai, Vernelle Emerald, MD  ?Calcium Carb-Cholecalciferol (CALCIUM 600+D3 PO) Take 1 tablet by mouth at bedtime.    [provider]  ?Cholecalciferol (VITAMIN D3 SUPER STRENGTH) 50 MCG (2000 UT) CAPS Take 4,000 Units by mouth daily.     [provider]  ?Cranberry 425 MG CAPS Take 425 mg by mouth daily.     [provider]  ?docusate sodium (COLACE) 100 MG capsule Take 100 mg by mouth daily as needed for mild constipation.    [provider]  ?furosemide (LASIX) 20 MG tablet Take 1 tablet (20 mg total) by mouth daily. 07/28/20   Rai, Vernelle Emerald, MD  ?levothyroxine (SYNTHROID, LEVOTHROID) 50 MCG tablet Take 50 mcg by mouth daily before breakfast.  10/08/15   [provider]  ?lidocaine-prilocaine (EMLA) cream Apply 1 application topically See admin instructions. Apply to port-a-cath one hour prior to procedure 09/16/19   [provider]  ?Multiple Vitamin (DAILY VITE PO) Take 1 tablet by mouth daily. s    [provider]  ?Multiple Vitamins-Minerals (PRESERVISION AREDS PO) Take 1 tablet by mouth in the morning and at bedtime.    [provider]  ?nystatin cream (MYCOSTATIN) Apply 1 application topically every 12 (twelve) hours as needed (to rash between the legs and groin).    [provider]  ?psyllium (METAMUCIL) 58.6 % packet Take 1 packet by mouth every Monday, Wednesday, and Friday. [place] 11/29/19   Donzetta Starch, NP  ?rOPINIRole (REQUIP) 1 MG tablet Take 1 mg by mouth at bedtime.    [provider]  ?sertraline (ZOLOFT) 25 MG tablet Take 25 mg by mouth daily.    [provider]  ?SPIRIVA HANDIHALER 18 MCG inhalation capsule Place 18 mcg into inhaler and inhale daily.  07/23/15   [provider]  ?timolol (TIMOPTIC) 0.5 %  ophthalmic solution Place 1 drop into both eyes 2 (two) times daily.    [provider]  ?trimethoprim (TRIMPEX) 100 MG tablet Take 100 mg by mouth daily. 11/15/20   [provider]  ? ? ?Physical Exam: ?Vitals:  ? 07/09/21 2130 07/09/21 2215 07/09/21 2230 07/09/21 2245  ?BP: (!) 161/55 (!) 155/54 (!) 122/110 139/68  ?Pulse: 87 87 87 90  ?Resp: (!) 21 (!) 24 (!) 22 (!) 22  ?Temp:      ?TempSrc:      ?SpO2: 97% 98% 95% 97%  ?Weight:      ?Height:      ? ? ?Physical Exam ?Vitals and nursing note reviewed.  ?Constitutional:   ?   General: She is not in acute distress. ?   Appearance: She is obese. She is not ill-appearing, toxic-appearing or diaphoretic.  ?HENT:  ?   Head: Normocephalic and atraumatic.  ?   Nose: Nose normal. No rhinorrhea.  ?Cardiovascular:  ?   Rate and Rhythm: Normal rate and regular rhythm.  ?Pulmonary:  ?   Effort: Pulmonary effort is normal.  ?   Breath sounds: No rales.  ?   Comments: Occ wheeze in RUL ?Abdominal:  ?   General: Bowel sounds are normal. There is no distension.  ?   Tenderness: There is no abdominal tenderness. There is no guarding or rebound.  ?   Comments: LLQ colostomy  ?Musculoskeletal:  ?   Right lower leg:  No edema.  ?   Left lower leg: No edema.  ?Skin: ?   General: Skin is warm and dry.  ?Neurological:  ?   General: No focal deficit present.  ?   Mental Status: She is alert and oriented to person, place, a

## 2021-07-09 NOTE — Assessment & Plan Note (Signed)
chronic

## 2021-07-09 NOTE — ED Triage Notes (Signed)
Patient arrived via [facility] from [facility] with complaints of shortness of breath for few days that worsened tonight. Hx of COPD on 2L [place] O2 at home. Patient she is on Spiriva for COPD and it was changed to a different type last week, states she feels like the new formulation do not work well. EMS reported that patient given '10mg'$  albuterol and 0.'5mg'$  atrovent en-route. Patient breathing improved post inhalers.  ?

## 2021-07-09 NOTE — Assessment & Plan Note (Signed)
Chronic. 

## 2021-07-09 NOTE — Subjective & Objective (Addendum)
CC: SOB ?HPI: ?88 year old white female with a history of COPD, chronic hypoxic respiratory failure on 2 L of oxygen continuously, history of colostomy, hypertension, obesity, reflux, history of follicular large cell lymphoma that is now considered cured per oncology, history of heparin allergy (but tolerates heparin flushes through her Port-A-Cath), Presents to the ER with 2 to 3 days of worsening shortness of breath.  Patient states that she has been short of breath for the last couple days.  She has been in bed.  She states that delivery device for her Spiriva does not work as well.  She claims that it is now in a larger container.  She is no longer getting the Spiriva capsules that are punctured. ? ?She states that she does not feel like she is getting enough oxygen.  She wants to turn her oxygen concentrator up to 3 L but was told by assisted-living that this is not allowed.  She was brought to the ER for evaluation. ? ?She denies any fever or chills. ? ?On arrival, temp 99.5 heart rate 88 blood pressure 173/49 satting 98% on 3 L. ? ?Work-up in the ER white count 16.3, hemoglobin 11.4, platelets 110 ? ?Sodium 137 potassium 3.2 chloride of 98 BUN of 14 creatinine 1.0 ? ?BNP 383 ? ?Chest x-ray demonstrates asymmetric infiltrates in the left lung either infectious or inflammatory. ? ?Patient declined to go home. ? ?[facility] hospitalist contacted for admission. ?

## 2021-07-09 NOTE — Assessment & Plan Note (Addendum)
Observation medical bed. Po prednisone. Continue po zithromax. Continue duonebs. Pt states she does not think her spiriva is working well for her. Change to nebulizer treatments. Check CT chest to rule out pneumonia seen on CXR. ?

## 2021-07-09 NOTE — Assessment & Plan Note (Signed)
Stable.  Continue home meds. ?

## 2021-07-09 NOTE — Assessment & Plan Note (Signed)
Stable

## 2021-07-09 NOTE — ED Provider Notes (Signed)
?[facility] ?Provider Note ? ? ?CSN: [id number] ?Arrival date & time: 07/09/21  1943 ? ?  ? ?History ? ?No chief complaint on file. ? ? ?Makayla Fisher is a 88 y.o. female w/ follicular large cell lymphoma s/p Rituxan in remission, CAD s/p CABG (2009), sinus bradycardia, hypertension, HFpEF, TIA, hypothyroidism, colon perforation s/p colostomy (2014), emphysema on 2 L nasal cannula at baseline presenting for worsening shortness of breath.  Recently changed her Spiriva from 1 administrative container to a different 1 and she reports since that change happened she has not been getting much relief from her Spiriva inhaler.  She is on 2 L of oxygen at baseline.  Patient reports she has been feeling more more short of breath but today felt a lot worse that she called EMS.  Patient lives at [facility] assisted living. ? ?HPI ? ?  ? ?Home Medications ?Prior to Admission medications   ?Medication Sig Start Date End Date Taking? Authorizing Provider  ?amLODipine (NORVASC) 5 MG tablet Take 1 tablet (5 mg total) by mouth daily. 07/28/20   Rai, Vernelle Emerald, MD  ?aspirin 81 MG EC tablet Take 1 tablet (81 mg total) by mouth daily. Swallow whole. 07/28/20   Rai, Vernelle Emerald, MD  ?atorvastatin (LIPITOR) 40 MG tablet Take 1 tablet (40 mg total) by mouth at bedtime. 07/27/20   Rai, Vernelle Emerald, MD  ?Calcium Carb-Cholecalciferol (CALCIUM 600+D3 PO) Take 1 tablet by mouth at bedtime.    [provider]  ?Cholecalciferol (VITAMIN D3 SUPER STRENGTH) 50 MCG (2000 UT) CAPS Take 4,000 Units by mouth daily.     [provider]  ?Cranberry 425 MG CAPS Take 425 mg by mouth daily.     [provider]  ?docusate sodium (COLACE) 100 MG capsule Take 100 mg by mouth daily as needed for mild constipation.    [provider]  ?furosemide (LASIX) 20 MG tablet Take 1 tablet (20 mg total) by mouth daily. 07/28/20   Rai, Vernelle Emerald, MD  ?levothyroxine (SYNTHROID, LEVOTHROID) 50 MCG tablet  Take 50 mcg by mouth daily before breakfast.  10/08/15   [provider]  ?lidocaine-prilocaine (EMLA) cream Apply 1 application topically See admin instructions. Apply to port-a-cath one hour prior to procedure 09/16/19   [provider]  ?Multiple Vitamin (DAILY VITE PO) Take 1 tablet by mouth daily. s    [provider]  ?Multiple Vitamins-Minerals (PRESERVISION AREDS PO) Take 1 tablet by mouth in the morning and at bedtime.    [provider]  ?nystatin cream (MYCOSTATIN) Apply 1 application topically every 12 (twelve) hours as needed (to rash between the legs and groin).    [provider]  ?psyllium (METAMUCIL) 58.6 % packet Take 1 packet by mouth every Monday, Wednesday, and Friday. ORANGE 11/29/19   Donzetta Starch, NP  ?rOPINIRole (REQUIP) 1 MG tablet Take 1 mg by mouth at bedtime.    [provider]  ?sertraline (ZOLOFT) 25 MG tablet Take 25 mg by mouth daily.    [provider]  ?SPIRIVA HANDIHALER 18 MCG inhalation capsule Place 18 mcg into inhaler and inhale daily.  07/23/15   [provider]  ?timolol (TIMOPTIC) 0.5 % ophthalmic solution Place 1 drop into both eyes 2 (two) times daily.    [provider]  ?trimethoprim (TRIMPEX) 100 MG tablet Take 100 mg by mouth daily. 11/15/20   [provider]  ?   ? ?Allergies    ?Epipen [epinephrine hcl (  nasal)], Levaquin [levofloxacin in d5w], Beta adrenergic blockers, Chlorhexidine gluconate, Heparin, and Sulfa antibiotics   ? ?Review of Systems   ?Review of Systems  ?Constitutional:  Negative for chills and fever.  ?HENT:  Negative for congestion and sore throat.   ?Respiratory:  Positive for chest tightness, shortness of breath and wheezing.   ?Gastrointestinal:  Negative for nausea and vomiting.  ?Skin:  Negative for rash and wound.  ? ?Physical Exam ?Updated Vital Signs ?BP (!) 155/54   Pulse 87   Temp 99.5 ?F (37.5 ?C) (Oral)   Resp (!) 24   Ht '5\' 1"'$  (1.549 m)   Wt 67.6  kg   SpO2 98%   BMI 28.15 kg/m?  ?Physical Exam ?Constitutional:   ?   General: She is not in acute distress. ?HENT:  ?   Head: Normocephalic and atraumatic.  ?Cardiovascular:  ?   Rate and Rhythm: Normal rate and regular rhythm.  ?Pulmonary:  ?   Effort: Tachypnea and accessory muscle usage present.  ?   Breath sounds: Wheezing present. No decreased breath sounds.  ?Chest:  ?   Chest wall: No tenderness.  ?Musculoskeletal:  ?   Right lower leg: No tenderness. No edema.  ?   Left lower leg: No tenderness. No edema.  ?Neurological:  ?   Mental Status: She is alert and oriented to person, place, and time.  ?Psychiatric:     ?   Mood and Affect: Mood normal.     ?   Behavior: Behavior normal.  ? ? ?ED Results / Procedures / Treatments   ?Labs ?(all labs ordered are listed, but only abnormal results are displayed) ?Labs Reviewed  ?CBC WITH DIFFERENTIAL/PLATELET - Abnormal; Notable for the following components:  ?    Result Value  ? WBC 16.3 (*)   ? RBC 3.57 (*)   ? Hemoglobin 11.4 (*)   ? HCT 35.1 (*)   ? Platelets 110 (*)   ? Neutro Abs 13.4 (*)   ? Monocytes Absolute 1.5 (*)   ? Abs Immature Granulocytes 0.12 (*)   ? All other components within normal limits  ?COMPREHENSIVE METABOLIC PANEL - Abnormal; Notable for the following components:  ? Potassium 3.2 (*)   ? Glucose, Bld 136 (*)   ? Creatinine, Ser 1.07 (*)   ? Total Protein 5.7 (*)   ? Albumin 3.3 (*)   ? GFR, Estimated 48 (*)   ? All other components within normal limits  ?BRAIN NATRIURETIC PEPTIDE  ? ? ?EKG ?None ? ?Radiology ?DG Chest 2 View ? ?Result Date: 07/09/2021 ?CLINICAL DATA:  Dyspnea EXAM: CHEST - 2 VIEW COMPARISON:  03/26/2021 FINDINGS: Lung volumes are small and pulmonary insufflation has diminished since prior examination. There has developed extensive asymmetric infiltrate throughout the left lung and within the right perihilar region, likely infectious or inflammatory. No pneumothorax or pleural effusion. Right internal jugular chest port is  unchanged with its tip within the superior vena cava. Coronary artery bypass grafting has been performed. Cardiac size within normal limits. Advanced atherosclerotic calcification within the thoracic aorta. Osseous structures are age-appropriate. IMPRESSION: Asymmetric pulmonary infiltrate, more extensive throughout the left lung, possibly infectious or inflammatory in the acute setting. Electronically Signed   By: Fidela Salisbury M.D.   On: 07/09/2021 20:33   ? ?Procedures ?Procedures  ? ? ?Medications Ordered in ED ?Medications  ?cefTRIAXone (ROCEPHIN) 1 g in sodium chloride 0.9 % 100 mL IVPB (1 g Intravenous New Bag/Given 07/09/21 2251)  ?methylPREDNISolone sodium succinate (  SOLU-MEDROL) 125 mg/2 mL injection 125 mg (125 mg Intravenous Given 07/09/21 2133)  ?ipratropium-albuterol (DUONEB) 0.5-2.5 (3) MG/3ML nebulizer solution 3 mL (3 mLs Nebulization Given 07/09/21 2244)  ?azithromycin (ZITHROMAX) tablet 500 mg (500 mg Oral Given 07/09/21 2251)  ? ? ?ED Course/ Medical Decision Making/ A&P ?  ?                        ?Medical Decision Making ?Amount and/or Complexity of Data Reviewed ?Labs: ordered. ?Radiology: ordered. ? ?Risk ?Prescription drug management. ? ? ?89 yoF [facility] in setting of spirva not working as well.  PTA got albuterol, Atrovent.  Chart review performed to obtain history and prior labs. ? ?On exam, tachypnea, decreased lung sounds bilaterally slightly worse on the left versus right.  Leukocytosis of 16.3, hemoglobin 11.4 with neutrophil predominance.  Platelets of 110.  0.1 to immature granulocytes.  Potassium 3.2, creatinine of 1.07 (up from 0.85 1 month ago), glucose of 136, albumin of 3.3, GFR 48.  Potassium replacement ordered.  Chest x-ray asymmetric pulmonary infiltrate with more prominent on the left lung on my review which is confirmed by radiology.  Solu-Medrol given.  BNP 383 possibly secondary to volume overload versus infection.  Bilateral lower extremities with no edema.   COVID flu obtained.  Concern for COPD exacerbation with possible pneumonia, will give azithromycin and ceftriaxone.  On reevaluation patient is moving more air but still has an end expiratory wheeze.  Additiona

## 2021-07-10 ENCOUNTER — Observation Stay (HOSPITAL_COMMUNITY): Payer: Medicare Other

## 2021-07-10 DIAGNOSIS — E876 Hypokalemia: Secondary | ICD-10-CM | POA: Diagnosis present

## 2021-07-10 DIAGNOSIS — J441 Chronic obstructive pulmonary disease with (acute) exacerbation: Secondary | ICD-10-CM | POA: Diagnosis not present

## 2021-07-10 LAB — CBC WITH DIFFERENTIAL/PLATELET
Abs Immature Granulocytes: 0.13 10*3/uL — ABNORMAL HIGH (ref 0.00–0.07)
Basophils Absolute: 0 10*3/uL (ref 0.0–0.1)
Basophils Relative: 0 %
Eosinophils Absolute: 0.1 10*3/uL (ref 0.0–0.5)
Eosinophils Relative: 0 %
HCT: 32.4 % — ABNORMAL LOW (ref 36.0–46.0)
Hemoglobin: 10.6 g/dL — ABNORMAL LOW (ref 12.0–15.0)
Immature Granulocytes: 1 %
Lymphocytes Relative: 4 %
Lymphs Abs: 0.7 10*3/uL (ref 0.7–4.0)
MCH: 31.6 pg (ref 26.0–34.0)
MCHC: 32.7 g/dL (ref 30.0–36.0)
MCV: 96.7 fL (ref 80.0–100.0)
Monocytes Absolute: 0.7 10*3/uL (ref 0.1–1.0)
Monocytes Relative: 4 %
Neutro Abs: 14.2 10*3/uL — ABNORMAL HIGH (ref 1.7–7.7)
Neutrophils Relative %: 91 %
Platelets: 105 10*3/uL — ABNORMAL LOW (ref 150–400)
RBC: 3.35 MIL/uL — ABNORMAL LOW (ref 3.87–5.11)
RDW: 13.7 % (ref 11.5–15.5)
WBC: 15.7 10*3/uL — ABNORMAL HIGH (ref 4.0–10.5)
nRBC: 0 % (ref 0.0–0.2)

## 2021-07-10 LAB — COMPREHENSIVE METABOLIC PANEL
ALT: 14 U/L (ref 0–44)
AST: 23 U/L (ref 15–41)
Albumin: 3 g/dL — ABNORMAL LOW (ref 3.5–5.0)
Alkaline Phosphatase: 57 U/L (ref 38–126)
Anion gap: 12 (ref 5–15)
BUN: 14 mg/dL (ref 8–23)
CO2: 25 mmol/L (ref 22–32)
Calcium: 9.2 mg/dL (ref 8.9–10.3)
Chloride: 99 mmol/L (ref 98–111)
Creatinine, Ser: 1.12 mg/dL — ABNORMAL HIGH (ref 0.44–1.00)
GFR, Estimated: 45 mL/min — ABNORMAL LOW (ref >60)
Glucose, Bld: 175 mg/dL — ABNORMAL HIGH (ref 70–99)
Potassium: 3.4 mmol/L — ABNORMAL LOW (ref 3.5–5.1)
Sodium: 136 mmol/L (ref 135–145)
Total Bilirubin: 0.7 mg/dL (ref 0.3–1.2)
Total Protein: 5.5 g/dL — ABNORMAL LOW (ref 6.5–8.1)

## 2021-07-10 LAB — RESP PANEL BY RT-PCR (FLU A&B, COVID) ARPGX2
Influenza A by PCR: NEGATIVE
Influenza B by PCR: NEGATIVE
SARS Coronavirus 2 by RT PCR: NEGATIVE

## 2021-07-10 LAB — MAGNESIUM: Magnesium: 1.6 mg/dL — ABNORMAL LOW (ref 1.7–2.4)

## 2021-07-10 MED ORDER — ROPINIROLE HCL 0.5 MG PO TABS: 1.0000 mg | ORAL_TABLET | Freq: Every day | ORAL | Status: DC

## 2021-07-10 MED ORDER — PREDNISONE 20 MG PO TABS: 40.0000 mg | ORAL_TABLET | Freq: Every day | ORAL | 0 refills | Status: AC

## 2021-07-10 MED ORDER — MENTHOL 3 MG MT LOZG: 1.0000 | LOZENGE | OROMUCOSAL | Status: DC | PRN

## 2021-07-10 MED ORDER — AZITHROMYCIN 250 MG PO TABS: 250.0000 mg | ORAL_TABLET | Freq: Every day | ORAL | 0 refills | Status: DC

## 2021-07-10 MED ORDER — AZITHROMYCIN 250 MG PO TABS: 250.0000 mg | ORAL_TABLET | Freq: Every day | ORAL | 0 refills | Status: AC

## 2021-07-10 MED ORDER — ASPIRIN EC 81 MG PO TBEC
81.0000 mg | DELAYED_RELEASE_TABLET | Freq: Every day | ORAL | Status: DC
  Administered 2021-07-10: 81 mg via ORAL
  Filled 2021-07-10: qty 1

## 2021-07-10 MED ORDER — ONDANSETRON HCL 4 MG PO TABS: 4.0000 mg | ORAL_TABLET | Freq: Four times a day (QID) | ORAL | Status: DC | PRN

## 2021-07-10 MED ORDER — ONDANSETRON HCL 4 MG/2ML IJ SOLN: 4.0000 mg | Freq: Four times a day (QID) | INTRAMUSCULAR | Status: DC | PRN

## 2021-07-10 MED ORDER — ATORVASTATIN CALCIUM 40 MG PO TABS: 40.0000 mg | ORAL_TABLET | Freq: Every day | ORAL | Status: DC

## 2021-07-10 MED ORDER — ACETAMINOPHEN 650 MG RE SUPP: 650.0000 mg | Freq: Four times a day (QID) | RECTAL | Status: DC | PRN

## 2021-07-10 MED ORDER — AMLODIPINE BESYLATE 5 MG PO TABS
5.0000 mg | ORAL_TABLET | Freq: Every day | ORAL | Status: DC
Start: 2021-07-10 — End: 2021-07-10
  Administered 2021-07-10: 5 mg via ORAL
  Filled 2021-07-10: qty 1

## 2021-07-10 MED ORDER — ACETAMINOPHEN 325 MG PO TABS: 650.0000 mg | ORAL_TABLET | Freq: Four times a day (QID) | ORAL | Status: DC | PRN

## 2021-07-10 MED ORDER — PREDNISONE 20 MG PO TABS
40.0000 mg | ORAL_TABLET | Freq: Every day | ORAL | Status: DC
  Administered 2021-07-10: 40 mg via ORAL
  Filled 2021-07-10: qty 2

## 2021-07-10 MED ORDER — MAGNESIUM OXIDE -MG SUPPLEMENT 400 (240 MG) MG PO TABS
400.0000 mg | ORAL_TABLET | Freq: Once | ORAL | Status: AC
  Administered 2021-07-10: 400 mg via ORAL
  Filled 2021-07-10: qty 1

## 2021-07-10 MED ORDER — IPRATROPIUM-ALBUTEROL 0.5-2.5 (3) MG/3ML IN SOLN
3.0000 mL | Freq: Four times a day (QID) | RESPIRATORY_TRACT | Status: DC
  Administered 2021-07-10: 3 mL via RESPIRATORY_TRACT
  Filled 2021-07-10: qty 3

## 2021-07-10 MED ORDER — IPRATROPIUM-ALBUTEROL 0.5-2.5 (3) MG/3ML IN SOLN
3.0000 mL | Freq: Three times a day (TID) | RESPIRATORY_TRACT | Status: DC
  Administered 2021-07-10: 3 mL via RESPIRATORY_TRACT
  Filled 2021-07-10: qty 3

## 2021-07-10 MED ORDER — TRIMETHOPRIM 100 MG PO TABS
100.0000 mg | ORAL_TABLET | Freq: Every day | ORAL | Status: DC
  Administered 2021-07-10: 100 mg via ORAL
  Filled 2021-07-10: qty 1

## 2021-07-10 MED ORDER — LEVOTHYROXINE SODIUM 50 MCG PO TABS
50.0000 ug | ORAL_TABLET | Freq: Every day | ORAL | Status: DC
  Administered 2021-07-10: 50 ug via ORAL
  Filled 2021-07-10: qty 1

## 2021-07-10 MED ORDER — FUROSEMIDE 20 MG PO TABS
20.0000 mg | ORAL_TABLET | Freq: Every day | ORAL | Status: DC
  Administered 2021-07-10: 20 mg via ORAL
  Filled 2021-07-10: qty 1

## 2021-07-10 MED ORDER — SERTRALINE HCL 50 MG PO TABS
25.0000 mg | ORAL_TABLET | Freq: Every day | ORAL | Status: DC
  Administered 2021-07-10: 25 mg via ORAL
  Filled 2021-07-10: qty 1

## 2021-07-10 MED ORDER — POTASSIUM CHLORIDE CRYS ER 20 MEQ PO TBCR: 20.0000 meq | EXTENDED_RELEASE_TABLET | Freq: Two times a day (BID) | ORAL | Status: DC

## 2021-07-10 MED ORDER — AZITHROMYCIN 250 MG PO TABS
500.0000 mg | ORAL_TABLET | Freq: Every day | ORAL | Status: DC
  Administered 2021-07-10: 500 mg via ORAL
  Filled 2021-07-10: qty 2

## 2021-07-10 NOTE — Discharge Summary (Signed)
Physician Discharge Summary  ?Makayla Fisher WER:[id number] DOB: 02-10-1923 DOA: 07/09/2021 ? ?PCP: Makayla Pi, NP ? ?Admit date: 07/09/2021 ?Discharge date: 07/10/2021 ? ?Admitted From: ALF- [facility] ?Disposition:  ALF- [facility] ? ?Recommendations for Outpatient Follow-up:  ?Follow up with PCP in 1 week ?Please obtain BMP/CBC in 1 week to follow-up on potassium and leukocytosis/anemia/thrombocytopenia respectively. ?Please follow-up hypomagnesemia. ?Please follow up on the following pending results: CT chest with contrast recommended 6-monthfollow-up ? ?Home Health: No ?Equipment/Devices: None ? ?Discharge Condition: Good ?CODE STATUS: Full ?Diet recommendation: Heart healthy ? ?Brief/Interim Summary: ?From H&P: From Dr. EKristopher Oppenheim "88year old white female with a history of COPD, chronic hypoxic respiratory failure on 2 L of oxygen continuously, history of colostomy, hypertension, obesity, reflux, history of follicular large cell lymphoma that is now considered cured per oncology, history of heparin allergy (but tolerates heparin flushes through her Port-A-Cath), Presents to the ER with 2 to 3 days of worsening shortness of breath.  Patient states that she has been short of breath for the last couple days.  She has been in bed.  She states that delivery device for her Spiriva does not work as well.  She claims that it is now in a larger container.  She is no longer getting the Spiriva capsules that are punctured. ?  ?She states that she does not feel like she is getting enough oxygen.  She wants to turn her oxygen concentrator up to 3 L but was told by assisted-living that this is not allowed.  She was brought to the ER for evaluation. ?  ?She denies any fever or chills. ?  ?On arrival, temp 99.5 heart rate 88 blood pressure 173/49 satting 98% on 3 L. ?  ?Work-up in the ER white count 16.3, hemoglobin 11.4, platelets 110 ?  ?Sodium 137 potassium 3.2 chloride of 98 BUN of 14 creatinine 1.0 ?  ?BNP  383 ?  ?Chest x-ray demonstrates asymmetric infiltrates in the left lung either infectious or inflammatory. ?  ?Patient declined to go home. ?  ?[facility] hospitalist contacted for admission. " ? ?Interim/subjective on day of discharge: Patient reports feeling better and is breathing well on her baseline 2 L nasal cannula supplemental oxygen.  She is not wheezing and is okay to go back to ALF today. ? ?White count improved slightly despite steroid treatment.  He remained afebrile.  CT chest showed right-sided consolidation suggestive of pneumonia. ? ?Discharge Diagnoses:  ?Principal Problem: ?  COPD exacerbation ([place] ?Active Problems: ?  Colostomy in place ([facility] ?  Hx of CABG x 3/stent ?  Hypertension ?  Obesity ?  GERD (gastroesophageal reflux disease) ?  Heparin allergy ?  Chronic respiratory failure with hypoxia, on home O2 therapy (HCC) - 2 L/min continuous ?  Hypokalemia ?  Hypomagnesemia ? ?COPD exacerbation with probable right-sided pneumonia/chronic respiratory failure ? Complete 5 days of steroids ? Finish azithromycin ? Currently at baseline oxygen requirement ? ?Hypokalemia/hypomagnesemia ? Replaced on day of discharge ? ?She will continue her chronic medication upon discharge as well. ? ?Discharge Instructions ? ?Discharge Instructions   ? ? Call MD for:  temperature >100.4   Complete by: As directed ?  ? Diet - low sodium heart healthy   Complete by: As directed ?  ? Increase activity slowly   Complete by: As directed ?  ? ?  ? ?Allergies as of 07/10/2021   ? ?   Reactions  ? Epipen [epinephrine Hcl (nasal)] Palpitations  ? Levaquin [  levofloxacin In D5w] Other (See Comments)  ? Weakness- "Allergic," per MAR  ? Beta Adrenergic Blockers Other (See Comments)  ? intolerant to higher doses of BB due to bradycardia.  ? Chlorhexidine Gluconate Other (See Comments)  ? Pt declined use and suspects she may have an intolerance  ? Heparin Other (See Comments)  ? "Allergic," pre MAR- Maybe affected platelets, per  patient ?Pt has tolerated heparin flush many times since 2015  ? Sulfa Antibiotics Rash  ? ?  ? ?  ?Medication List  ?  ? ?TAKE these medications   ? ?amLODipine 5 MG tablet ?Commonly known as: NORVASC ?Take 1 tablet (5 mg total) by mouth daily. ?  ?ARTIFICIAL TEAR OP ?Place 1 drop into both eyes as needed (dry eyes). ?  ?Aspirin Low Dose 81 MG EC tablet ?Generic drug: aspirin ?Take 1 tablet (81 mg total) by mouth daily. Swallow whole. ?  ?atorvastatin 40 MG tablet ?Commonly known as: LIPITOR ?Take 1 tablet (40 mg total) by mouth at bedtime. ?  ?azithromycin 250 MG tablet ?Commonly known as: ZITHROMAX ?Take 1 tablet (250 mg total) by mouth daily. ?Start taking on: July 11, 2021 ?  ?CALCIUM 600+D3 PO ?Take 1 tablet by mouth at bedtime. ?  ?Cranberry 425 MG Caps ?Take 425 mg by mouth daily. ?  ?DAILY VITE PO ?Take 1 tablet by mouth daily. ?  ?docusate sodium 100 MG capsule ?Commonly known as: COLACE ?Take 100 mg by mouth daily as needed for mild constipation. ?  ?furosemide 20 MG tablet ?Commonly known as: LASIX ?Take 1 tablet (20 mg total) by mouth daily. ?  ?levothyroxine 50 MCG tablet ?Commonly known as: SYNTHROID ?Take 50 mcg by mouth daily before breakfast. ?  ?lidocaine-prilocaine cream ?Commonly known as: EMLA ?Apply 1 application topically See admin instructions. Apply to port-a-cath one hour prior to procedure ?  ?nystatin cream ?Commonly known as: MYCOSTATIN ?Apply 1 application topically every 12 (twelve) hours as needed (to rash between the legs and groin). ?  ?OXYGEN ?Inhale 2 L into the lungs continuous. ?  ?predniSONE 20 MG tablet ?Commonly known as: DELTASONE ?Take 2 tablets (40 mg total) by mouth daily with breakfast for 4 days. ?Start taking on: July 11, 2021 ?  ?PRESERVISION AREDS PO ?Take 1 tablet by mouth in the morning and at bedtime. ?  ?psyllium 58.6 % packet ?Commonly known as: METAMUCIL ?Take 1 packet by mouth daily as needed (constipation). ?  ?psyllium 58.6 % packet ?Commonly known as:  METAMUCIL ?Take 1 packet by mouth every Monday, Wednesday, and Friday. ORANGE ?  ?rOPINIRole 1 MG tablet ?Commonly known as: REQUIP ?Take 1 mg by mouth at bedtime. ?  ?sertraline 25 MG tablet ?Commonly known as: ZOLOFT ?Take 25 mg by mouth daily. ?  ?Spiriva HandiHaler 18 MCG inhalation capsule ?Generic drug: tiotropium ?Place 18 mcg into inhaler and inhale daily. ?  ?timolol 0.5 % ophthalmic solution ?Commonly known as: TIMOPTIC ?Place 1 drop into both eyes 2 (two) times daily. ?  ?trimethoprim 100 MG tablet ?Commonly known as: TRIMPEX ?Take 100 mg by mouth daily. ?  ?Vitamin D3 Super Strength 50 MCG (2000 UT) Caps ?Generic drug: Cholecalciferol ?Take 4,000 Units by mouth daily. ?  ? ?  ? ? Follow-up Information   ? ? Makayla Pi, NP Follow up.   ?Specialty: Nurse Practitioner ?Contact information: ?[address] ?[address] ?[place] [address] ?[phone] ? ? ?  ?  ? ? Josue Hector, MD .   ?Specialty:  Cardiology ?Contact information: ?[address] ?[address] ?[place] [address] ?[phone] ? ? ?  ?  ? ?  ?  ? ?  ? ?Allergies  ?Allergen Reactions  ? Epipen [Epinephrine Hcl (Nasal)] Palpitations  ? Levaquin [Levofloxacin In D5w] Other (See Comments)  ?  Weakness- "Allergic," per MAR  ? Beta Adrenergic Blockers Other (See Comments)  ?  intolerant to higher doses of BB due to bradycardia.  ? Chlorhexidine Gluconate Other (See Comments)  ?  Pt declined use and suspects she may have an intolerance  ? Heparin Other (See Comments)  ?  "Allergic," pre MAR- Maybe affected platelets, per patient ?Pt has tolerated heparin flush many times since 2015  ? Sulfa Antibiotics Rash  ? ? ?Consultations: ?None ? ? ?Procedures/Studies: ?DG Chest 2 View ? ?Result Date: 07/09/2021 ?CLINICAL DATA:  Dyspnea EXAM: CHEST - 2 VIEW COMPARISON:  03/26/2021 FINDINGS: Lung volumes are small and pulmonary insufflation has diminished since prior examination. There has developed extensive asymmetric infiltrate throughout  the left lung and within the right perihilar region, likely infectious or inflammatory. No pneumothorax or pleural effusion. Right internal jugular chest port is unchanged with its tip within the superior vena c

## 2021-07-10 NOTE — Discharge Instructions (Signed)

## 2021-07-10 NOTE — [place] FL2 (Signed)
?[place] MEDICAID FL2 LEVEL OF CARE SCREENING TOOL  ?  ? ?IDENTIFICATION  ?Patient Name: ?Makayla Fisher Birthdate: 1923/03/18 Sex: female Admission Date (Current Location): ?07/09/2021  ?[place] and [place] Number: ? [place] ?  Facility and Address:  ?[facility], [place] [address], [place], [place] [address] ?     Provider Number: ?[id number]  ?Attending Physician Name and Address:  ?Shelda Pal,* ? Relative Name and Phone Number:  ?[name] Niece [phone]  [phone] ?   ?Current Level of Care: ?Hospital Recommended Level of Care: ?Assisted Living Facility Prior Approval Number: ?  ? ?Date Approved/Denied: ?  PASRR Number: ?  ? ?Discharge Plan: ?Other (Comment) (assisted living) ?  ? ?Current Diagnoses: ?Patient Active Problem List  ? Diagnosis Date Noted  ? Hypokalemia 07/10/2021  ? Hypomagnesemia 07/10/2021  ? COPD exacerbation ([place]) 07/09/2021  ? Chronic respiratory failure with hypoxia, on home O2 therapy (HCC) - 2 L/min continuous 03/26/2021  ? Heparin allergy 07/23/2020  ? Obesity 11/28/2019  ? Hx of migraines 11/28/2019  ? GERD (gastroesophageal reflux disease) 11/28/2019  ? Dyslipidemia 04/30/2017  ? Hx of CABG x 3/stent 04/30/2017  ? Bilateral carotid artery stenosis 04/30/2017  ? Hypothyroidism 04/30/2017  ? Hypertension 04/30/2017  ? S/P colostomy ([place]) 04/30/2017  ? Anxiety disorder 04/30/2017  ? Glaucoma 12/13/2016  ? Colostomy in place [facility]) 12/13/2016  ? Vitamin D deficiency 10/20/2016  ? Follicular lymphoma grade IIIa of intra-abdominal lymph nodes ([place]) 04/22/2016  ? Iron deficiency anemia due to chronic blood loss 08/07/2014  ? HTN (hypertension) 07/15/2014  ? Unilateral carotid artery disease ([place]) 07/15/2014  ? 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 NHL (non-Hodgkin's lymphoma) ([place]) 10/18/2013  ? 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[place]) 07/24/2013  ? ? ?Orientation RESPIRATION BLADDER Height & Weight   ?  ?Self, Time,  Situation, Place ? O2 Continent Weight: 149 lb (67.6 kg) ?Height:  '5\' 1"'$  (154.9 cm)  ?BEHAVIORAL SYMPTOMS/MOOD NEUROLOGICAL BOWEL NUTRITION STATUS  ?    Continent Diet (see discharge summary)  ?AMBULATORY STATUS COMMUNICATION OF NEEDS Skin   ?Limited Assist Verbally Other (Comment) (erythema/redness) ?  ?  ?  ?    ?     ?     ? ? ?Personal Care Assistance Level of Assistance  ?Bathing, Feeding, Dressing Bathing Assistance: Limited assistance ?Feeding assistance: Limited assistance ?Dressing Assistance: Limited assistance ?   ? ?Functional Limitations Info  ?Sight, Hearing, Speech Sight Info: Adequate ?Hearing Info: Adequate ?Speech Info: Adequate  ? ? ?SPECIAL CARE FACTORS FREQUENCY  ?    ?  ?  ?  ?  ?  ?  ?   ? ? ?Contractures Contractures Info: Not present  ? ? ?Additional Factors Info  ?Code Status, Allergies Code Status Info: full ?Allergies Info: Epipen (Epinephrine Hcl (Nasal)), Levaquin (Levofloxacin In D5w), Beta Adrenergic Blockers, Chlorhexidine Gluconate, Heparin, Sulfa Antibiotics ?  ?  ?  ?   ? ?Current Medications (07/10/2021):  This is the current hospital active medication list ?Current Facility-Administered Medications  ?Medication Dose Route Frequency Provider Last Rate Last Admin  ? acetaminophen (TYLENOL) tablet 650 mg  650 mg Oral Q6H PRN Kristopher Oppenheim, DO      ? Or  ? acetaminophen (TYLENOL) suppository 650 mg  650 mg Rectal Q6H PRN Kristopher Oppenheim, DO      ? amLODipine (NORVASC) tablet 5 mg  5 mg Oral Daily Kristopher Oppenheim, DO   5 mg at 07/10/21 0800  ? aspirin  EC tablet 81 mg  81 mg Oral Daily Kristopher Oppenheim, DO   81 mg at 07/10/21 1324  ? atorvastatin (LIPITOR) tablet 40 mg  40 mg Oral QHS Kristopher Oppenheim, DO      ? azithromycin [facility]) tablet 500 mg  500 mg Oral Daily Kristopher Oppenheim, DO   500 mg at 07/10/21 0800  ? furosemide (LASIX) tablet 20 mg  20 mg Oral Daily Kristopher Oppenheim, DO   20 mg at 07/10/21 4010  ? ipratropium-albuterol (DUONEB) 0.5-2.5 (3) MG/3ML nebulizer solution 3 mL  3 mL Nebulization TID Shelda Pal, DO      ? levothyroxine (SYNTHROID) tablet 50 mcg  50 mcg Oral Q0600 Kristopher Oppenheim, DO   50 mcg at 07/10/21 0533  ? menthol-cetylpyridinium (CEPACOL) lozenge 3 mg  1 lozenge Oral PRN Shelda Pal, DO      ? ondansetron [facility]) tablet 4 mg  4 mg Oral Q6H PRN Kristopher Oppenheim, DO      ? Or  ? ondansetron (ZOFRAN) injection 4 mg  4 mg Intravenous Q6H PRN Kristopher Oppenheim, DO      ? potassium chloride SA (KLOR-CON M) CR tablet 20 mEq  20 mEq Oral BID Shelda Pal, DO      ? predniSONE (DELTASONE) tablet 40 mg  40 mg Oral Q breakfast Kristopher Oppenheim, DO   40 mg at 07/10/21 0800  ? rOPINIRole (REQUIP) tablet 1 mg  1 mg Oral QHS Kristopher Oppenheim, DO      ? sertraline (ZOLOFT) tablet 25 mg  25 mg Oral Daily Kristopher Oppenheim, DO   25 mg at 07/10/21 0801  ? trimethoprim (TRIMPEX) tablet 100 mg  100 mg Oral Daily Kristopher Oppenheim, DO   100 mg at 07/10/21 2725  ? ? ? ?Discharge Medications: ?Please see discharge summary for a list of discharge medications. ? ?Relevant Imaging Results: ? ?Relevant Lab Results: ? ? ?Additional Information ?SSN# [id number] ? ?Joanne Chars, LCSW ? ? ? ? ?

## 2021-07-10 NOTE — TOC Transition Note (Signed)
Transition of Care (TOC) - CM/SW Discharge Note ? ? ?Patient Details  ?Name: [name] ?MRN: [id number] ?Date of Birth: [birth year] ? ?Transition of Care [facility]) CM/SW Contact:  ?Joanne Chars, LCSW ?Phone Number: ?07/10/2021, 3:41 PM ? ? ?Clinical Narrative:   CSW informed pt ready for return to [facility].  CSW spoke with [place] at [facility], faxed DC summary and FL2, she requested an updated oxygen order which was also faxed.  Requested MD to send new prescriptions to [facility] on [address] where niece [name] will pick them up.  [name] requesting to transport pt back to facility, MD informed.   ? ? ? ?Final next level of care: Assisted Living ?Barriers to Discharge: No Barriers Identified ? ? ?Patient Goals and CMS Choice ?  ?  ?  ? ?Discharge Placement ?  ?           ?Patient chooses bed at:  [facility] ?Patient to be transferred to facility by: niece kay kiser ?Name of family member notified: niece [name] ?Patient and family notified of of transfer: 07/10/21 ? ?Discharge Plan and Services ?  ?  ?           ?  ?  ?  ?  ?  ?  ?  ?  ?  ?  ? ?Social Determinants of Health (SDOH) Interventions ?  ? ? ?Readmission Risk Interventions ?No flowsheet data found. ? ? ? ? ?

## 2021-07-10 NOTE — Plan of Care (Signed)
?  Problem: Education: °Goal: Knowledge of disease or condition will improve °Outcome: Completed/Met °Goal: Knowledge of the prescribed therapeutic regimen will improve °Outcome: Completed/Met °Goal: Individualized Educational Video(s) °Outcome: Completed/Met °  °Problem: Activity: °Goal: Ability to tolerate increased activity will improve °Outcome: Completed/Met °Goal: Will verbalize the importance of balancing activity with adequate rest periods °Outcome: Completed/Met °  °Problem: Respiratory: °Goal: Ability to maintain a clear airway will improve °Outcome: Completed/Met °Goal: Levels of oxygenation will improve °Outcome: Completed/Met °Goal: Ability to maintain adequate ventilation will improve °Outcome: Completed/Met °  °

## 2021-07-26 ENCOUNTER — Other Ambulatory Visit: Payer: Medicare Other

## 2021-07-26 ENCOUNTER — Inpatient Hospital Stay: Payer: Medicare Other

## 2021-07-27 ENCOUNTER — Telehealth: Payer: Self-pay | Admitting: Hematology & Oncology

## 2021-07-27 NOTE — Telephone Encounter (Signed)
Pt called and LVM requesting to reschedule missed appointment, called patient back and LVM  ?

## 2021-07-28 ENCOUNTER — Institutional Professional Consult (permissible substitution): Payer: Medicare Other | Admitting: Emergency Medicine

## 2021-08-05 ENCOUNTER — Inpatient Hospital Stay: Payer: Medicare Other | Attending: Hematology & Oncology

## 2021-08-05 VITALS — BP 173/54 | HR 54 | Temp 98.2°F | Resp 18

## 2021-08-05 DIAGNOSIS — Z452 Encounter for adjustment and management of vascular access device: Secondary | ICD-10-CM | POA: Diagnosis present

## 2021-08-05 DIAGNOSIS — D508 Other iron deficiency anemias: Secondary | ICD-10-CM | POA: Diagnosis present

## 2021-08-05 DIAGNOSIS — Z95828 Presence of other vascular implants and grafts: Secondary | ICD-10-CM

## 2021-08-05 DIAGNOSIS — Z8572 Personal history of non-Hodgkin lymphomas: Secondary | ICD-10-CM | POA: Diagnosis present

## 2021-08-05 MED ORDER — SODIUM CHLORIDE 0.9% FLUSH
10.0000 mL | Freq: Once | INTRAVENOUS | Status: AC
  Administered 2021-08-05: 10 mL via INTRAVENOUS

## 2021-08-05 MED ORDER — HEPARIN SOD (PORK) LOCK FLUSH 100 UNIT/ML IV SOLN
500.0000 [IU] | Freq: Once | INTRAVENOUS | Status: AC
  Administered 2021-08-05: 500 [IU] via INTRAVENOUS

## 2021-08-05 NOTE — Patient Instructions (Signed)

## 2021-08-09 ENCOUNTER — Telehealth: Payer: Self-pay | Admitting: Pulmonary Disease

## 2021-08-09 ENCOUNTER — Ambulatory Visit (INDEPENDENT_AMBULATORY_CARE_PROVIDER_SITE_OTHER): Payer: Medicare Other | Admitting: Pulmonary Disease

## 2021-08-09 ENCOUNTER — Encounter: Payer: Self-pay | Admitting: Pulmonary Disease

## 2021-08-09 VITALS — BP 140/80 | HR 63 | Temp 97.7°F | Ht 61.0 in | Wt 151.4 lb

## 2021-08-09 DIAGNOSIS — I2510 Atherosclerotic heart disease of native coronary artery without angina pectoris: Secondary | ICD-10-CM | POA: Diagnosis not present

## 2021-08-09 DIAGNOSIS — Z9981 Dependence on supplemental oxygen: Secondary | ICD-10-CM | POA: Diagnosis not present

## 2021-08-09 DIAGNOSIS — J9611 Chronic respiratory failure with hypoxia: Secondary | ICD-10-CM

## 2021-08-09 MED ORDER — INCRUSE ELLIPTA 62.5 MCG/ACT IN AEPB: 1.0000 | INHALATION_SPRAY | Freq: Every day | RESPIRATORY_TRACT | 11 refills | Status: DC

## 2021-08-09 NOTE — Patient Instructions (Addendum)
Start Incruse inhaler 1 puff daily ? ?Stop spiriva inhaler if you are able to start Incruse ? ?Continue to use supplemental Oxygen 2L throughout the day and night.  ? ?Follow up in 3 months ? ? ?

## 2021-08-09 NOTE — Progress Notes (Signed)
Synopsis: Referred in April 2023 for respiratory failure by Farrel Conners, NP  Subjective:   PATIENT ID: Makayla Fisher GENDER: female DOB: 1922-12-11, MRN: [id number]   HPI  Chief Complaint  Patient presents with   Consult    On home 02 all the time now.  2L.  At rest, no SOB.  With exertion is SOB easily.  No coughing or wheezing.    Petrea Fisher is a 88 year old woman, former smoker with history of CAD s/p CABG, GERD, non-hodgkin's lymphoma and CVA who is referred to pulmonary clinic for respiratory failure.   She reports needing supplemental oxygen at all times now and is currently using 2L. She denies shortness of breath at rest but mainly with exertion. She is not experiencing wheezing or coughing.   She was previously using spiriva handihaler which was switched to spiriva respimat which she does not like.   She is widowed. She is a former smoker with 25 pack year smoking history.   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   Stroke-like symptoms s/p IV tPA 11/28/2019   TIA (transient ischemic attack)      Family History  Problem Relation Age of Onset   Diverticulitis Mother    Heart attack Mother    Ulcers Father      Social History   Socioeconomic History   Marital status: Widowed    Spouse name: Not on file   Number of children: Not on file   Years of education: Not on file   Highest education level:  Not on file  Occupational History   Not on file  Tobacco Use   Smoking status: Former    Packs/day: 1.00    Years: 25.00    Pack years: 25.00    Types: Cigarettes    Start date: 05/16/1948    Quit date: 05/17/1963    Years since quitting: 58.2   Smokeless tobacco: Never   Tobacco comments:    quit smoking 50 years ago  Vaping Use   Vaping Use: Never used  Substance and Sexual Activity   Alcohol use: No    Alcohol/week: 0.0 standard drinks   Drug use: No   Sexual activity: Not on file  Other Topics Concern   Not on file  Social History Narrative   Not on file   Social Determinants of Health   Financial Resource Strain: Not on file  Food Insecurity: Not on file  Transportation Needs: Not on file  Physical Activity: Not on file  Stress: Not on file  Social Connections: Not on file  Intimate Partner Violence: Not on file     Allergies  Allergen Reactions   Epipen [Epinephrine Hcl (Nasal)] Palpitations   Levaquin [Levofloxacin In D5w] Other (See Comments)    Weakness- "Allergic," per MAR   Beta Adrenergic Blockers Other (See Comments)    intolerant to higher doses of BB due  to bradycardia.   Chlorhexidine Gluconate Other (See Comments)    Pt declined use and suspects she may have an intolerance   Heparin Other (See Comments)    "Allergic," pre MAR- Maybe affected platelets, per patient Pt has tolerated heparin flush many times since 2015   Sulfa Antibiotics Rash     Outpatient Medications Prior to Visit  Medication Sig Dispense Refill   amLODipine (NORVASC) 5 MG tablet Take 1 tablet (5 mg total) by mouth daily. 30 tablet 2   ARTIFICIAL TEAR OP Place 1 drop into both eyes as needed (dry eyes).     aspirin 81 MG EC tablet Take 1 tablet (81 mg total) by mouth daily. Swallow whole. 30 tablet 11   Calcium Carb-Cholecalciferol (CALCIUM 600+D3 PO) Take 1 tablet by mouth at bedtime.     levothyroxine (SYNTHROID, LEVOTHROID) 50 MCG tablet Take 50 mcg by mouth daily before  breakfast.      Multiple Vitamin (DAILY VITE PO) Take 1 tablet by mouth daily.     OXYGEN Inhale 2 L into the lungs continuous.     psyllium (METAMUCIL) 58.6 % packet Take 1 packet by mouth every Monday, Wednesday, and Friday. ORANGE     rOPINIRole (REQUIP) 1 MG tablet Take 1 mg by mouth at bedtime.     timolol (TIMOPTIC) 0.5 % ophthalmic solution Place 1 drop into both eyes 2 (two) times daily.     atorvastatin (LIPITOR) 40 MG tablet Take 1 tablet (40 mg total) by mouth at bedtime. 30 tablet 30   Cholecalciferol (VITAMIN D3 SUPER STRENGTH) 50 MCG (2000 UT) CAPS Take 4,000 Units by mouth daily.      Cranberry 425 MG CAPS Take 425 mg by mouth daily.      docusate sodium (COLACE) 100 MG capsule Take 100 mg by mouth daily as needed for mild constipation.     furosemide (LASIX) 20 MG tablet Take 1 tablet (20 mg total) by mouth daily. 30 tablet 3   lidocaine-prilocaine (EMLA) cream Apply 1 application topically See admin instructions. Apply to port-a-cath one hour prior to procedure     Multiple Vitamins-Minerals (PRESERVISION AREDS PO) Take 1 tablet by mouth in the morning and at bedtime.     nystatin cream (MYCOSTATIN) Apply 1 application topically every 12 (twelve) hours as needed (to rash between the legs and groin).     psyllium (METAMUCIL) 58.6 % packet Take 1 packet by mouth daily as needed (constipation).     SPIRIVA HANDIHALER 18 MCG inhalation capsule Place 18 mcg into inhaler and inhale daily.      trimethoprim (TRIMPEX) 100 MG tablet Take 100 mg by mouth daily.     azithromycin (ZITHROMAX) 250 MG tablet Take 1 tablet (250 mg total) by mouth daily. 4 each 0   sertraline (ZOLOFT) 25 MG tablet Take 25 mg by mouth daily.     No facility-administered medications prior to visit.   Review of Systems  Constitutional:  Positive for malaise/fatigue. Negative for chills, fever and weight loss.  HENT:  Negative for congestion, sinus pain and sore throat.   Eyes: Negative.   Respiratory:   Positive for shortness of breath. Negative for cough, hemoptysis, sputum production and wheezing.   Cardiovascular:  Negative for chest pain, palpitations, orthopnea, claudication and leg swelling.  Gastrointestinal:  Negative for abdominal pain, heartburn, nausea and vomiting.  Genitourinary: Negative.   Musculoskeletal:  Negative for joint pain and myalgias.  Skin:  Negative for rash.  Neurological:  Negative for weakness.  Endo/Heme/Allergies: Negative.   Psychiatric/Behavioral: Negative.     Objective:   Vitals:   08/09/21 1537  BP: 140/80  Pulse: 63  Temp: 97.7 F (36.5 C)  SpO2: 100%  Weight: 151 lb 6.4 oz (68.7 kg)  Height: '5\' 1"'$  (1.549 m)   Physical Exam Constitutional:      General: She is not in acute distress.    Appearance: She is not ill-appearing.  HENT:     Head: Normocephalic and atraumatic.  Eyes:     General: No scleral icterus.    Conjunctiva/sclera: Conjunctivae normal.     Pupils: Pupils are equal, round, and reactive to light.  Cardiovascular:     Rate and Rhythm: Normal rate and regular rhythm.     Pulses: Normal pulses.     Heart sounds: Normal heart sounds. No murmur heard. Pulmonary:     Effort: Pulmonary effort is normal.     Breath sounds: Normal breath sounds. No wheezing, rhonchi or rales.  Abdominal:     General: Bowel sounds are normal.     Palpations: Abdomen is soft.  Musculoskeletal:     Right lower leg: No edema.     Left lower leg: No edema.  Lymphadenopathy:     Cervical: No cervical adenopathy.  Skin:    General: Skin is warm and dry.  Neurological:     General: No focal deficit present.     Mental Status: She is alert.  Psychiatric:        Mood and Affect: Mood normal.        Behavior: Behavior normal.        Thought Content: Thought content normal.        Judgment: Judgment normal.   CBC    Component Value Date/Time   WBC 15.7 (H) 07/10/2021 0215   RBC 3.35 (L) 07/10/2021 0215   HGB 10.6 (L) 07/10/2021 0215   HGB  12.0 05/27/2021 1410   HGB 12.5 01/31/2017 0931   HGB 12.4 07/21/2016 1046   HCT 32.4 (L) 07/10/2021 0215   HCT 37.8 01/31/2017 0931   HCT 37.5 07/21/2016 1046   PLT 105 (L) 07/10/2021 0215   PLT 133 (L) 05/27/2021 1410   PLT 153 01/31/2017 0931   PLT 161 07/21/2016 1046   MCV 96.7 07/10/2021 0215   MCV 98 01/31/2017 0931   MCV 97.1 07/21/2016 1046   MCH 31.6 07/10/2021 0215   MCHC 32.7 07/10/2021 0215   RDW 13.7 07/10/2021 0215   RDW 14.6 01/31/2017 0931   RDW 16.3 (H) 07/21/2016 1046   LYMPHSABS 0.7 07/10/2021 0215   LYMPHSABS 1.5 01/31/2017 0931   LYMPHSABS 1.0 07/21/2016 1046   MONOABS 0.7 07/10/2021 0215   MONOABS 0.9 07/21/2016 1046   EOSABS 0.1 07/10/2021 0215   EOSABS 0.2 01/31/2017 0931   BASOSABS 0.0 07/10/2021 0215   BASOSABS 0.0 01/31/2017 0931   BASOSABS 0.1 07/21/2016 1046      Latest Ref Rng & Units 07/10/2021    2:15 AM 07/09/2021    9:25 PM 05/27/2021    2:10 PM  BMP  Glucose 70 - 99 mg/dL 175   136   93    BUN 8 - 23 mg/dL '14   14   14    '$ Creatinine 0.44 - 1.00 mg/dL 1.12   1.07   0.85    Sodium 135 - 145 mmol/L 136   137   142    Potassium 3.5 - 5.1 mmol/L 3.4   3.2   3.6  Chloride 98 - 111 mmol/L 99   98   103    CO2 22 - 32 mmol/L '25   28   30    '$ Calcium 8.9 - 10.3 mg/dL 9.2   9.5   10.1     Chest imaging: CT Chest 07/10/21 1. Interval development of patchy left upper lobe peribronchovascular ground-glass airspace opacities as well as a cluster of nodular-like airspace opacities within the left lower lobe. Findings suggestive of multifocal infection. Recommend follow-up CT in 3 months to evaluate for resolution and exclude any underlying lesion. 2. Stable mediastinal lymphadenopathy and query left hilar lymphadenopathy, noting limited sensitivity for the detection of hilar adenopathy on this noncontrast study. 3. Slight interval increase in size of trace bilateral, left greater than right pleural effusions. 4.  Aortic  Atherosclerosis  PFT:     View : No data to display.         Labs:  Path:  Echo:  Heart Catheterization:  Assessment & Plan:   Chronic respiratory failure with hypoxia, on home O2 therapy (HCC) - 2 L/min continuous - Plan: umeclidinium bromide (INCRUSE ELLIPTA) 62.5 MCG/ACT AEPB  Discussion: Makayla Fisher is a 88 year old woman, former smoker with history of CAD s/p CABG, GERD, non-hodgkin's lymphoma and CVA who is referred to pulmonary clinic for respiratory failure.   She has evidence of emphysema on her recent CT Chest scan. She is to continue supplemental oxygen at this time.   We will try her on Incruse 1 puff daily as she does not like the spiriva respimat inhaler compared to the handihaler she was previously on. We will also consider LABA/LAMA therapy in the future if she does not notice benefit from Incruse.   Follow up in 3 months.  Freda Jackson, MD [facility] Office: [phone]    Current Outpatient Medications:    amLODipine (NORVASC) 5 MG tablet, Take 1 tablet (5 mg total) by mouth daily., Disp: 30 tablet, Rfl: 2   ARTIFICIAL TEAR OP, Place 1 drop into both eyes as needed (dry eyes)., Disp: , Rfl:    aspirin 81 MG EC tablet, Take 1 tablet (81 mg total) by mouth daily. Swallow whole., Disp: 30 tablet, Rfl: 11   Calcium Carb-Cholecalciferol (CALCIUM 600+D3 PO), Take 1 tablet by mouth at bedtime., Disp: , Rfl:    levothyroxine (SYNTHROID, LEVOTHROID) 50 MCG tablet, Take 50 mcg by mouth daily before breakfast. , Disp: , Rfl:    Multiple Vitamin (DAILY VITE PO), Take 1 tablet by mouth daily., Disp: , Rfl:    OXYGEN, Inhale 2 L into the lungs continuous., Disp: , Rfl:    psyllium (METAMUCIL) 58.6 % packet, Take 1 packet by mouth every Monday, Wednesday, and Friday. ORANGE, Disp: , Rfl:    rOPINIRole (REQUIP) 1 MG tablet, Take 1 mg by mouth at bedtime., Disp: , Rfl:    timolol (TIMOPTIC) 0.5 % ophthalmic solution, Place 1 drop into  both eyes 2 (two) times daily., Disp: , Rfl:    umeclidinium bromide (INCRUSE ELLIPTA) 62.5 MCG/ACT AEPB, Inhale 1 puff into the lungs daily., Disp: 30 each, Rfl: 11   atorvastatin (LIPITOR) 40 MG tablet, Take 1 tablet (40 mg total) by mouth at bedtime., Disp: 30 tablet, Rfl: 30   Cholecalciferol (VITAMIN D3 SUPER STRENGTH) 50 MCG (2000 UT) CAPS, Take 4,000 Units by mouth daily. , Disp: , Rfl:    Cranberry 425 MG CAPS, Take 425 mg by mouth daily. , Disp: , Rfl:  docusate sodium (COLACE) 100 MG capsule, Take 100 mg by mouth daily as needed for mild constipation., Disp: , Rfl:    furosemide (LASIX) 20 MG tablet, Take 1 tablet (20 mg total) by mouth daily., Disp: 30 tablet, Rfl: 3   lidocaine-prilocaine (EMLA) cream, Apply 1 application topically See admin instructions. Apply to port-a-cath one hour prior to procedure, Disp: , Rfl:    Multiple Vitamins-Minerals (PRESERVISION AREDS PO), Take 1 tablet by mouth in the morning and at bedtime., Disp: , Rfl:    nystatin cream (MYCOSTATIN), Apply 1 application topically every 12 (twelve) hours as needed (to rash between the legs and groin)., Disp: , Rfl:    psyllium (METAMUCIL) 58.6 % packet, Take 1 packet by mouth daily as needed (constipation)., Disp: , Rfl:    SPIRIVA HANDIHALER 18 MCG inhalation capsule, Place 18 mcg into inhaler and inhale daily. , Disp: , Rfl:    trimethoprim (TRIMPEX) 100 MG tablet, Take 100 mg by mouth daily., Disp: , Rfl:

## 2021-08-09 NOTE — Telephone Encounter (Signed)
Called and spoke with Makayla Fisher at [facility] to let him know that patient was seen today and was told to stop Spiriva and start Incruse. He expressed understanding and said she would let the assisted living facility where she lives know about the changes. Nothing further needed at this time.  ?

## 2021-09-15 ENCOUNTER — Encounter: Payer: Self-pay | Admitting: Pulmonary Disease

## 2021-09-24 ENCOUNTER — Other Ambulatory Visit: Payer: Self-pay | Admitting: *Deleted

## 2021-09-24 DIAGNOSIS — D500 Iron deficiency anemia secondary to blood loss (chronic): Secondary | ICD-10-CM

## 2021-09-24 DIAGNOSIS — Z95828 Presence of other vascular implants and grafts: Secondary | ICD-10-CM

## 2021-09-24 DIAGNOSIS — E038 Other specified hypothyroidism: Secondary | ICD-10-CM

## 2021-09-24 DIAGNOSIS — C8204 Follicular lymphoma grade I, lymph nodes of axilla and upper limb: Secondary | ICD-10-CM

## 2021-09-27 ENCOUNTER — Inpatient Hospital Stay: Payer: Medicare Other | Attending: Hematology & Oncology

## 2021-09-27 ENCOUNTER — Other Ambulatory Visit: Payer: Medicare Other

## 2021-09-27 ENCOUNTER — Inpatient Hospital Stay: Payer: Medicare Other

## 2021-09-27 VITALS — BP 155/57 | HR 61 | Temp 97.9°F | Resp 20

## 2021-09-27 DIAGNOSIS — Z79899 Other long term (current) drug therapy: Secondary | ICD-10-CM | POA: Insufficient documentation

## 2021-09-27 DIAGNOSIS — Z8572 Personal history of non-Hodgkin lymphomas: Secondary | ICD-10-CM | POA: Diagnosis present

## 2021-09-27 DIAGNOSIS — Z452 Encounter for adjustment and management of vascular access device: Secondary | ICD-10-CM | POA: Diagnosis present

## 2021-09-27 DIAGNOSIS — C8204 Follicular lymphoma grade I, lymph nodes of axilla and upper limb: Secondary | ICD-10-CM

## 2021-09-27 DIAGNOSIS — E038 Other specified hypothyroidism: Secondary | ICD-10-CM

## 2021-09-27 DIAGNOSIS — Z95828 Presence of other vascular implants and grafts: Secondary | ICD-10-CM

## 2021-09-27 DIAGNOSIS — D508 Other iron deficiency anemias: Secondary | ICD-10-CM | POA: Diagnosis present

## 2021-09-27 DIAGNOSIS — D500 Iron deficiency anemia secondary to blood loss (chronic): Secondary | ICD-10-CM

## 2021-09-27 LAB — CMP (CANCER CENTER ONLY)
ALT: 13 U/L (ref 0–44)
AST: 21 U/L (ref 15–41)
Albumin: 3.8 g/dL (ref 3.5–5.0)
Alkaline Phosphatase: 70 U/L (ref 38–126)
Anion gap: 10 (ref 5–15)
BUN: 19 mg/dL (ref 8–23)
CO2: 29 mmol/L (ref 22–32)
Calcium: 9.6 mg/dL (ref 8.9–10.3)
Chloride: 103 mmol/L (ref 98–111)
Creatinine: 0.91 mg/dL (ref 0.44–1.00)
GFR, Estimated: 58 mL/min — ABNORMAL LOW (ref >60)
Glucose, Bld: 99 mg/dL (ref 70–99)
Potassium: 3.5 mmol/L (ref 3.5–5.1)
Sodium: 142 mmol/L (ref 135–145)
Total Bilirubin: 0.5 mg/dL (ref 0.3–1.2)
Total Protein: 6 g/dL — ABNORMAL LOW (ref 6.5–8.1)

## 2021-09-27 LAB — CBC WITH DIFFERENTIAL (CANCER CENTER ONLY)
Abs Immature Granulocytes: 0.03 10*3/uL (ref 0.00–0.07)
Basophils Absolute: 0.1 10*3/uL (ref 0.0–0.1)
Basophils Relative: 1 %
Eosinophils Absolute: 0.4 10*3/uL (ref 0.0–0.5)
Eosinophils Relative: 4 %
HCT: 37.1 % (ref 36.0–46.0)
Hemoglobin: 11.8 g/dL — ABNORMAL LOW (ref 12.0–15.0)
Immature Granulocytes: 0 %
Lymphocytes Relative: 28 %
Lymphs Abs: 2.4 10*3/uL (ref 0.7–4.0)
MCH: 29.9 pg (ref 26.0–34.0)
MCHC: 31.8 g/dL (ref 30.0–36.0)
MCV: 93.9 fL (ref 80.0–100.0)
Monocytes Absolute: 0.7 10*3/uL (ref 0.1–1.0)
Monocytes Relative: 9 %
Neutro Abs: 4.8 10*3/uL (ref 1.7–7.7)
Neutrophils Relative %: 58 %
Platelet Count: 148 10*3/uL — ABNORMAL LOW (ref 150–400)
RBC: 3.95 MIL/uL (ref 3.87–5.11)
RDW: 14.2 % (ref 11.5–15.5)
WBC Count: 8.4 10*3/uL (ref 4.0–10.5)
nRBC: 0 % (ref 0.0–0.2)

## 2021-09-27 MED ORDER — SODIUM CHLORIDE 0.9% FLUSH
10.0000 mL | Freq: Once | INTRAVENOUS | Status: AC
  Administered 2021-09-27: 10 mL via INTRAVENOUS

## 2021-09-27 MED ORDER — HEPARIN SOD (PORK) LOCK FLUSH 100 UNIT/ML IV SOLN
500.0000 [IU] | Freq: Once | INTRAVENOUS | Status: AC
  Administered 2021-09-27: 500 [IU] via INTRAVENOUS

## 2021-10-20 ENCOUNTER — Telehealth: Payer: Self-pay | Admitting: *Deleted

## 2021-10-20 NOTE — Telephone Encounter (Signed)
Message received from patient wanting to know if she can come in tomorrow for lab work d/t she is not feeling well.  Message sent to scheduling to bring pt in tomorrow for labs only.

## 2021-10-20 NOTE — Telephone Encounter (Signed)
Called and lvm with niece with upcoming appointments and requested a callback to confirm.

## 2021-10-21 ENCOUNTER — Inpatient Hospital Stay: Payer: Medicare Other

## 2021-10-21 VITALS — BP 133/63 | HR 72 | Temp 97.6°F | Resp 19

## 2021-10-21 DIAGNOSIS — E038 Other specified hypothyroidism: Secondary | ICD-10-CM

## 2021-10-21 DIAGNOSIS — C8201 Follicular lymphoma grade I, lymph nodes of head, face, and neck: Secondary | ICD-10-CM

## 2021-10-21 DIAGNOSIS — Z452 Encounter for adjustment and management of vascular access device: Secondary | ICD-10-CM | POA: Diagnosis not present

## 2021-10-21 DIAGNOSIS — C8204 Follicular lymphoma grade I, lymph nodes of axilla and upper limb: Secondary | ICD-10-CM

## 2021-10-21 DIAGNOSIS — C8225 Follicular lymphoma grade III, unspecified, lymph nodes of inguinal region and lower limb: Secondary | ICD-10-CM

## 2021-10-21 DIAGNOSIS — I158 Other secondary hypertension: Secondary | ICD-10-CM

## 2021-10-21 DIAGNOSIS — D500 Iron deficiency anemia secondary to blood loss (chronic): Secondary | ICD-10-CM

## 2021-10-21 DIAGNOSIS — Z95828 Presence of other vascular implants and grafts: Secondary | ICD-10-CM

## 2021-10-21 LAB — CBC WITH DIFFERENTIAL (CANCER CENTER ONLY)
Abs Immature Granulocytes: 0.05 10*3/uL (ref 0.00–0.07)
Basophils Absolute: 0.1 10*3/uL (ref 0.0–0.1)
Basophils Relative: 1 %
Eosinophils Absolute: 0.5 10*3/uL (ref 0.0–0.5)
Eosinophils Relative: 4 %
HCT: 38.1 % (ref 36.0–46.0)
Hemoglobin: 12.2 g/dL (ref 12.0–15.0)
Immature Granulocytes: 1 %
Lymphocytes Relative: 20 %
Lymphs Abs: 2.1 10*3/uL (ref 0.7–4.0)
MCH: 29.8 pg (ref 26.0–34.0)
MCHC: 32 g/dL (ref 30.0–36.0)
MCV: 93.2 fL (ref 80.0–100.0)
Monocytes Absolute: 0.9 10*3/uL (ref 0.1–1.0)
Monocytes Relative: 9 %
Neutro Abs: 7 10*3/uL (ref 1.7–7.7)
Neutrophils Relative %: 65 %
Platelet Count: 161 10*3/uL (ref 150–400)
RBC: 4.09 MIL/uL (ref 3.87–5.11)
RDW: 14.4 % (ref 11.5–15.5)
WBC Count: 10.6 10*3/uL — ABNORMAL HIGH (ref 4.0–10.5)
nRBC: 0 % (ref 0.0–0.2)

## 2021-10-21 LAB — FERRITIN: Ferritin: 143 ng/mL (ref 11–307)

## 2021-10-21 LAB — CMP (CANCER CENTER ONLY)
ALT: 11 U/L (ref 0–44)
AST: 21 U/L (ref 15–41)
Albumin: 4 g/dL (ref 3.5–5.0)
Alkaline Phosphatase: 62 U/L (ref 38–126)
Anion gap: 10 (ref 5–15)
BUN: 17 mg/dL (ref 8–23)
CO2: 29 mmol/L (ref 22–32)
Calcium: 10.1 mg/dL (ref 8.9–10.3)
Chloride: 100 mmol/L (ref 98–111)
Creatinine: 0.92 mg/dL (ref 0.44–1.00)
GFR, Estimated: 57 mL/min — ABNORMAL LOW (ref >60)
Glucose, Bld: 114 mg/dL — ABNORMAL HIGH (ref 70–99)
Potassium: 3.4 mmol/L — ABNORMAL LOW (ref 3.5–5.1)
Sodium: 139 mmol/L (ref 135–145)
Total Bilirubin: 0.5 mg/dL (ref 0.3–1.2)
Total Protein: 6.2 g/dL — ABNORMAL LOW (ref 6.5–8.1)

## 2021-10-21 LAB — RETICULOCYTES
Immature Retic Fract: 21.8 % — ABNORMAL HIGH (ref 2.3–15.9)
RBC.: 4.03 MIL/uL (ref 3.87–5.11)
Retic Count, Absolute: 66.5 10*3/uL (ref 19.0–186.0)
Retic Ct Pct: 1.7 % (ref 0.4–3.1)

## 2021-10-21 LAB — LACTATE DEHYDROGENASE: LDH: 209 U/L — ABNORMAL HIGH (ref 98–192)

## 2021-10-21 LAB — TSH: TSH: 4.398 u[IU]/mL (ref 0.350–4.500)

## 2021-10-21 NOTE — Patient Instructions (Signed)

## 2021-10-22 ENCOUNTER — Telehealth: Payer: Self-pay | Admitting: *Deleted

## 2021-10-22 LAB — IRON AND IRON BINDING CAPACITY (CC-WL,HP ONLY)
Iron: 37 ug/dL (ref 28–170)
Saturation Ratios: 15 % (ref 10.4–31.8)
TIBC: 244 ug/dL — ABNORMAL LOW (ref 250–450)
UIBC: 207 ug/dL (ref 148–442)

## 2021-10-22 NOTE — Telephone Encounter (Signed)
As noted below by Dr. Marin Olp, I tried calling the niece but was unable to leave a message because the phone kept ringing. Patient did not answer her phone either. Dr. Marin Olp wants the patient to know that the thyroid is OK.

## 2021-10-22 NOTE — Telephone Encounter (Signed)
-----   Message from [name], MD sent at 10/22/2021 11:57 AM EDT ----- Call - the iron level is ok!!   Laurey Arrow

## 2021-10-22 NOTE — Telephone Encounter (Signed)
-----   Message from [name], MD sent at 10/22/2021  5:53 AM EDT ----- Call - the thyroid is ok!!  Makayla Fisher

## 2021-11-15 ENCOUNTER — Encounter: Payer: Self-pay | Admitting: Pulmonary Disease

## 2021-11-15 ENCOUNTER — Ambulatory Visit: Payer: Medicare Other | Admitting: Pulmonary Disease

## 2021-11-15 ENCOUNTER — Ambulatory Visit (INDEPENDENT_AMBULATORY_CARE_PROVIDER_SITE_OTHER): Payer: Medicare Other | Admitting: Pulmonary Disease

## 2021-11-15 VITALS — BP 140/60 | HR 61 | Ht 61.0 in | Wt 147.0 lb

## 2021-11-15 DIAGNOSIS — J9611 Chronic respiratory failure with hypoxia: Secondary | ICD-10-CM

## 2021-11-15 DIAGNOSIS — J432 Centrilobular emphysema: Secondary | ICD-10-CM

## 2021-11-15 DIAGNOSIS — Z9981 Dependence on supplemental oxygen: Secondary | ICD-10-CM | POA: Diagnosis not present

## 2021-11-15 NOTE — Patient Instructions (Addendum)
Continue on incruse inhaler daily  I will discuss Dr. Johnsie Cancel regarding any further heart failure work up that should be done  Follow up in 3 months and we will discuss repeat CT Chest scan at that time.

## 2021-11-15 NOTE — Progress Notes (Signed)
Synopsis: Referred in April 2023 for respiratory failure by Farrel Conners, NP  Subjective:   PATIENT ID: Makayla Fisher GENDER: female DOB: 04/11/1923, MRN: [id number]   HPI  Chief Complaint  Patient presents with   Follow-up    Follow-up, feeling weak. On 2ML of Oxygen.   Makayla Fisher is a 88 year old woman, former smoker with history of CAD s/p CABG, GERD, non-hodgkin's lymphoma and CVA who returns to pulmonary clinic for respiratory failure and emphysema.   She was started on incruse inhaler at last visit in place of spiriva due to insurance. She reports progressive weakness as she is not as active as she would like. She also describes concern for possible depression as she is not interested in her facilities social activities anymore.   She had lab work done at her facility recently for her fatigue and dyspnea.  Initial OV 08/09/21 She reports needing supplemental oxygen at all times now and is currently using 2L. She denies shortness of breath at rest but mainly with exertion. She is not experiencing wheezing or coughing.   She was previously using spiriva handihaler which was switched to spiriva respimat which she does not like.   She is widowed. She is a former smoker with 25 pack year smoking history.   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   Stroke-like symptoms s/p IV tPA 11/28/2019   TIA (transient ischemic  attack)      Family History  Problem Relation Age of Onset   Diverticulitis Mother    Heart attack Mother    Ulcers Father      Social History   Socioeconomic History   Marital status: Widowed    Spouse name: Not on file   Number of children: Not on file   Years of education: Not on file   Highest education level: Not on file  Occupational History   Not on file  Tobacco Use   Smoking status: Former    Packs/day: 1.00    Years: 25.00    Total pack years: 25.00    Types: Cigarettes    Start date: 05/16/1948    Quit date: 05/17/1963    Years since quitting: 58.5   Smokeless tobacco: Never   Tobacco comments:    quit smoking 50 years ago  Vaping Use   Vaping Use: Never used  Substance and Sexual Activity   Alcohol use: No    Alcohol/week: 0.0 standard drinks of alcohol   Drug use: No   Sexual activity: Not on file  Other Topics Concern   Not on file  Social History Narrative   Not on file   Social Determinants of Health   Financial Resource Strain: Not on file  Food Insecurity: Not on file  Transportation Needs: Not on file  Physical Activity: Not on file  Stress: Not on file  Social Connections: Not on file  Intimate Partner  Violence: Not on file     Allergies  Allergen Reactions   Epipen [Epinephrine Hcl (Nasal)] Palpitations   Levaquin [Levofloxacin In D5w] Other (See Comments)    Weakness- "Allergic," per MAR   Beta Adrenergic Blockers Other (See Comments)    intolerant to higher doses of BB due to bradycardia.   Chlorhexidine Gluconate Other (See Comments)    Pt declined use and suspects she may have an intolerance   Heparin Other (See Comments)    "Allergic," pre MAR- Maybe affected platelets, per patient Pt has tolerated heparin flush many times since 2015   Sulfa Antibiotics Rash     Outpatient Medications Prior to Visit  Medication Sig Dispense Refill   OXYGEN Inhale 2 L into the lungs continuous.     umeclidinium bromide (INCRUSE ELLIPTA)  62.5 MCG/ACT AEPB Inhale 1 puff into the lungs daily. 30 each 11   SPIRIVA HANDIHALER 18 MCG inhalation capsule Place 18 mcg into inhaler and inhale daily.      amLODipine (NORVASC) 5 MG tablet Take 1 tablet (5 mg total) by mouth daily. 30 tablet 2   ARTIFICIAL TEAR OP Place 1 drop into both eyes as needed (dry eyes).     aspirin 81 MG EC tablet Take 1 tablet (81 mg total) by mouth daily. Swallow whole. 30 tablet 11   atorvastatin (LIPITOR) 40 MG tablet Take 1 tablet (40 mg total) by mouth at bedtime. 30 tablet 30   Calcium Carb-Cholecalciferol (CALCIUM 600+D3 PO) Take 1 tablet by mouth at bedtime.     Cholecalciferol (VITAMIN D3 SUPER STRENGTH) 50 MCG (2000 UT) CAPS Take 4,000 Units by mouth daily.      Cranberry 425 MG CAPS Take 425 mg by mouth daily.      docusate sodium (COLACE) 100 MG capsule Take 100 mg by mouth daily as needed for mild constipation.     furosemide (LASIX) 20 MG tablet Take 1 tablet (20 mg total) by mouth daily. 30 tablet 3   levothyroxine (SYNTHROID, LEVOTHROID) 50 MCG tablet Take 50 mcg by mouth daily before breakfast.      lidocaine-prilocaine (EMLA) cream Apply 1 application topically See admin instructions. Apply to port-a-cath one hour prior to procedure     Multiple Vitamin (DAILY VITE PO) Take 1 tablet by mouth daily.     Multiple Vitamins-Minerals (PRESERVISION AREDS PO) Take 1 tablet by mouth in the morning and at bedtime.     nystatin cream (MYCOSTATIN) Apply 1 application topically every 12 (twelve) hours as needed (to rash between the legs and groin).     psyllium (METAMUCIL) 58.6 % packet Take 1 packet by mouth every Monday, Wednesday, and Friday. ORANGE     psyllium (METAMUCIL) 58.6 % packet Take 1 packet by mouth daily as needed (constipation).     rOPINIRole (REQUIP) 1 MG tablet Take 1 mg by mouth at bedtime.     timolol (TIMOPTIC) 0.5 % ophthalmic solution Place 1 drop into both eyes 2 (two) times daily.     trimethoprim (TRIMPEX) 100 MG tablet Take 100 mg  by mouth daily.     No facility-administered medications prior to visit.   Review of Systems  Constitutional:  Positive for malaise/fatigue. Negative for chills, fever and weight loss.  HENT:  Negative for congestion, sinus pain and sore throat.   Eyes: Negative.   Respiratory:  Positive for shortness of breath. Negative for cough, hemoptysis, sputum production and wheezing.   Cardiovascular:  Negative for chest pain, palpitations, orthopnea, claudication and  leg swelling.  Gastrointestinal:  Negative for abdominal pain, heartburn, nausea and vomiting.  Genitourinary: Negative.   Musculoskeletal:  Negative for joint pain and myalgias.  Skin:  Negative for rash.  Neurological:  Negative for weakness.  Endo/Heme/Allergies: Negative.   Psychiatric/Behavioral: Negative.      Objective:   Vitals:   11/15/21 1447  BP: 140/60  Pulse: 61  SpO2: 93%  Weight: 147 lb (66.7 kg)  Height: '5\' 1"'$  (1.549 m)   Physical Exam Constitutional:      General: She is not in acute distress.    Appearance: She is not ill-appearing.  HENT:     Head: Normocephalic and atraumatic.  Cardiovascular:     Rate and Rhythm: Normal rate and regular rhythm.     Pulses: Normal pulses.     Heart sounds: Normal heart sounds. No murmur heard. Pulmonary:     Effort: Pulmonary effort is normal.     Breath sounds: Normal breath sounds. No wheezing, rhonchi or rales.  Musculoskeletal:     Right lower leg: No edema.     Left lower leg: No edema.  Lymphadenopathy:     Cervical: No cervical adenopathy.  Skin:    General: Skin is warm and dry.  Neurological:     General: No focal deficit present.     Mental Status: She is alert.  Psychiatric:        Mood and Affect: Mood normal.        Behavior: Behavior normal.        Thought Content: Thought content normal.        Judgment: Judgment normal.    CBC    Component Value Date/Time   WBC 10.6 (H) 10/21/2021 1359   WBC 15.7 (H) 07/10/2021 0215   RBC 4.03  10/21/2021 1400   RBC 4.09 10/21/2021 1359   HGB 12.2 10/21/2021 1359   HGB 12.5 01/31/2017 0931   HGB 12.4 07/21/2016 1046   HCT 38.1 10/21/2021 1359   HCT 37.8 01/31/2017 0931   HCT 37.5 07/21/2016 1046   PLT 161 10/21/2021 1359   PLT 153 01/31/2017 0931   PLT 161 07/21/2016 1046   MCV 93.2 10/21/2021 1359   MCV 98 01/31/2017 0931   MCV 97.1 07/21/2016 1046   MCH 29.8 10/21/2021 1359   MCHC 32.0 10/21/2021 1359   RDW 14.4 10/21/2021 1359   RDW 14.6 01/31/2017 0931   RDW 16.3 (H) 07/21/2016 1046   LYMPHSABS 2.1 10/21/2021 1359   LYMPHSABS 1.5 01/31/2017 0931   LYMPHSABS 1.0 07/21/2016 1046   MONOABS 0.9 10/21/2021 1359   MONOABS 0.9 07/21/2016 1046   EOSABS 0.5 10/21/2021 1359   EOSABS 0.2 01/31/2017 0931   BASOSABS 0.1 10/21/2021 1359   BASOSABS 0.0 01/31/2017 0931   BASOSABS 0.1 07/21/2016 1046      Latest Ref Rng & Units 10/21/2021    1:59 PM 09/27/2021    3:51 PM 07/10/2021    2:15 AM  BMP  Glucose 70 - 99 mg/dL 114  99  175   BUN 8 - 23 mg/dL '17  19  14   '$ Creatinine 0.44 - 1.00 mg/dL 0.92  0.91  1.12   Sodium 135 - 145 mmol/L 139  142  136   Potassium 3.5 - 5.1 mmol/L 3.4  3.5  3.4   Chloride 98 - 111 mmol/L 100  103  99   CO2 22 - 32 mmol/L '29  29  25   '$ Calcium 8.9 - 10.3 mg/dL 10.1  9.6  9.2    Chest imaging: CT Chest 07/10/21 1. Interval development of patchy left upper lobe peribronchovascular ground-glass airspace opacities as well as a cluster of nodular-like airspace opacities within the left lower lobe. Findings suggestive of multifocal infection. Recommend follow-up CT in 3 months to evaluate for resolution and exclude any underlying lesion. 2. Stable mediastinal lymphadenopathy and query left hilar lymphadenopathy, noting limited sensitivity for the detection of hilar adenopathy on this noncontrast study. 3. Slight interval increase in size of trace bilateral, left greater than right pleural effusions. 4.  Aortic Atherosclerosis  PFT:     No  data to display         Labs:  Path:  Echo 06/2020: LV EF 50-55%. Grade II diastolic dysfunction. RV systolic function is normal. Mildly elevated PASP. LA severely dilated. RA moderately dilated.   Heart Catheterization:  Assessment & Plan:   Chronic respiratory failure with hypoxia, on home O2 therapy (HCC) - 2 L/min continuous  Centrilobular emphysema (HCC)  Discussion: Blossie Gootee is a 88 year old woman, former smoker with history of CAD s/p CABG, GERD, non-hodgkin's lymphoma and CVA who returns to pulmonary clinic for respiratory failure and emphysema.    She is to continue incruse inhaler for her emphysema. She is to continue supplemental oxygen at this time.   Will send order for her to receive physical therapy at her facility. I will discuss her case with Dr. Johnsie Cancel of cardiology and see if she needs any fruther evaluation for heart failure. We will request lab result records from her facility.   Follow up in 3 months.  Makayla Jackson, MD [facility] Office: [phone]    Current Outpatient Medications:    OXYGEN, Inhale 2 L into the lungs continuous., Disp: , Rfl:    umeclidinium bromide (INCRUSE ELLIPTA) 62.5 MCG/ACT AEPB, Inhale 1 puff into the lungs daily., Disp: 30 each, Rfl: 11   amLODipine (NORVASC) 5 MG tablet, Take 1 tablet (5 mg total) by mouth daily., Disp: 30 tablet, Rfl: 2   ARTIFICIAL TEAR OP, Place 1 drop into both eyes as needed (dry eyes)., Disp: , Rfl:    aspirin 81 MG EC tablet, Take 1 tablet (81 mg total) by mouth daily. Swallow whole., Disp: 30 tablet, Rfl: 11   atorvastatin (LIPITOR) 40 MG tablet, Take 1 tablet (40 mg total) by mouth at bedtime., Disp: 30 tablet, Rfl: 30   Calcium Carb-Cholecalciferol (CALCIUM 600+D3 PO), Take 1 tablet by mouth at bedtime., Disp: , Rfl:    Cholecalciferol (VITAMIN D3 SUPER STRENGTH) 50 MCG (2000 UT) CAPS, Take 4,000 Units by mouth daily. , Disp: , Rfl:    Cranberry 425 MG CAPS, Take  425 mg by mouth daily. , Disp: , Rfl:    docusate sodium (COLACE) 100 MG capsule, Take 100 mg by mouth daily as needed for mild constipation., Disp: , Rfl:    furosemide (LASIX) 20 MG tablet, Take 1 tablet (20 mg total) by mouth daily., Disp: 30 tablet, Rfl: 3   levothyroxine (SYNTHROID, LEVOTHROID) 50 MCG tablet, Take 50 mcg by mouth daily before breakfast. , Disp: , Rfl:    lidocaine-prilocaine (EMLA) cream, Apply 1 application topically See admin instructions. Apply to port-a-cath one hour prior to procedure, Disp: , Rfl:    Multiple Vitamin (DAILY VITE PO), Take 1 tablet by mouth daily., Disp: , Rfl:    Multiple Vitamins-Minerals (PRESERVISION AREDS PO), Take 1 tablet by mouth in the morning and at bedtime., Disp: ,  Rfl:    nystatin cream (MYCOSTATIN), Apply 1 application topically every 12 (twelve) hours as needed (to rash between the legs and groin)., Disp: , Rfl:    psyllium (METAMUCIL) 58.6 % packet, Take 1 packet by mouth every Monday, Wednesday, and Friday. ORANGE, Disp: , Rfl:    psyllium (METAMUCIL) 58.6 % packet, Take 1 packet by mouth daily as needed (constipation)., Disp: , Rfl:    rOPINIRole (REQUIP) 1 MG tablet, Take 1 mg by mouth at bedtime., Disp: , Rfl:    timolol (TIMOPTIC) 0.5 % ophthalmic solution, Place 1 drop into both eyes 2 (two) times daily., Disp: , Rfl:    trimethoprim (TRIMPEX) 100 MG tablet, Take 100 mg by mouth daily., Disp: , Rfl:

## 2021-11-17 ENCOUNTER — Other Ambulatory Visit: Payer: Self-pay | Admitting: *Deleted

## 2021-11-17 DIAGNOSIS — C8204 Follicular lymphoma grade I, lymph nodes of axilla and upper limb: Secondary | ICD-10-CM

## 2021-11-18 ENCOUNTER — Inpatient Hospital Stay: Payer: Medicare Other | Admitting: Hematology & Oncology

## 2021-11-18 ENCOUNTER — Inpatient Hospital Stay: Payer: Medicare Other | Attending: Hematology & Oncology

## 2021-11-18 ENCOUNTER — Inpatient Hospital Stay: Payer: Medicare Other

## 2021-11-21 ENCOUNTER — Encounter: Payer: Self-pay | Admitting: Pulmonary Disease

## 2021-11-21 ENCOUNTER — Telehealth: Payer: Self-pay | Admitting: Pulmonary Disease

## 2021-11-21 NOTE — Telephone Encounter (Signed)
Please place order for her to receive physical therapy at her retirement/nursing facility for deconditioning.  Thanks, JD

## 2021-11-22 ENCOUNTER — Ambulatory Visit: Payer: Medicare Other | Admitting: Hematology & Oncology

## 2021-11-22 ENCOUNTER — Other Ambulatory Visit: Payer: Medicare Other

## 2021-11-25 NOTE — Telephone Encounter (Signed)
Order form for physical therapy has been completed.

## 2021-11-26 ENCOUNTER — Ambulatory Visit: Payer: Medicare Other | Admitting: Hematology & Oncology

## 2021-11-26 ENCOUNTER — Other Ambulatory Visit: Payer: Medicare Other

## 2021-12-06 ENCOUNTER — Ambulatory Visit: Payer: Medicare Other | Admitting: Hematology & Oncology

## 2021-12-06 ENCOUNTER — Inpatient Hospital Stay: Payer: Medicare Other

## 2021-12-06 ENCOUNTER — Other Ambulatory Visit: Payer: Medicare Other

## 2021-12-16 ENCOUNTER — Inpatient Hospital Stay: Payer: Medicare Other | Admitting: Hematology & Oncology

## 2021-12-16 ENCOUNTER — Inpatient Hospital Stay: Payer: Medicare Other

## 2021-12-16 ENCOUNTER — Inpatient Hospital Stay: Payer: Medicare Other | Attending: Hematology & Oncology

## 2022-02-15 ENCOUNTER — Ambulatory Visit: Payer: Medicare Other | Admitting: Pulmonary Disease

## 2022-02-24 ENCOUNTER — Inpatient Hospital Stay: Payer: Medicare Other | Attending: Hematology & Oncology

## 2022-02-24 ENCOUNTER — Inpatient Hospital Stay: Payer: Medicare Other

## 2022-02-24 ENCOUNTER — Encounter: Payer: Self-pay | Admitting: Hematology & Oncology

## 2022-02-24 ENCOUNTER — Inpatient Hospital Stay (HOSPITAL_BASED_OUTPATIENT_CLINIC_OR_DEPARTMENT_OTHER): Payer: Medicare Other | Admitting: Hematology & Oncology

## 2022-02-24 VITALS — BP 175/59 | HR 58 | Temp 98.0°F | Resp 17 | Ht 62.0 in | Wt 148.0 lb

## 2022-02-24 DIAGNOSIS — Z8572 Personal history of non-Hodgkin lymphomas: Secondary | ICD-10-CM | POA: Insufficient documentation

## 2022-02-24 DIAGNOSIS — K909 Intestinal malabsorption, unspecified: Secondary | ICD-10-CM | POA: Diagnosis not present

## 2022-02-24 DIAGNOSIS — D500 Iron deficiency anemia secondary to blood loss (chronic): Secondary | ICD-10-CM

## 2022-02-24 DIAGNOSIS — C8203 Follicular lymphoma grade I, intra-abdominal lymph nodes: Secondary | ICD-10-CM | POA: Diagnosis not present

## 2022-02-24 DIAGNOSIS — D508 Other iron deficiency anemias: Secondary | ICD-10-CM | POA: Diagnosis present

## 2022-02-24 DIAGNOSIS — Z452 Encounter for adjustment and management of vascular access device: Secondary | ICD-10-CM | POA: Insufficient documentation

## 2022-02-24 DIAGNOSIS — Z79899 Other long term (current) drug therapy: Secondary | ICD-10-CM | POA: Diagnosis not present

## 2022-02-24 DIAGNOSIS — C8204 Follicular lymphoma grade I, lymph nodes of axilla and upper limb: Secondary | ICD-10-CM

## 2022-02-24 DIAGNOSIS — Z7982 Long term (current) use of aspirin: Secondary | ICD-10-CM | POA: Diagnosis not present

## 2022-02-24 LAB — CBC WITH DIFFERENTIAL (CANCER CENTER ONLY)
Abs Immature Granulocytes: 0.03 10*3/uL (ref 0.00–0.07)
Basophils Absolute: 0.1 10*3/uL (ref 0.0–0.1)
Basophils Relative: 1 %
Eosinophils Absolute: 0.2 10*3/uL (ref 0.0–0.5)
Eosinophils Relative: 3 %
HCT: 32.6 % — ABNORMAL LOW (ref 36.0–46.0)
Hemoglobin: 10.3 g/dL — ABNORMAL LOW (ref 12.0–15.0)
Immature Granulocytes: 0 %
Lymphocytes Relative: 29 %
Lymphs Abs: 2 10*3/uL (ref 0.7–4.0)
MCH: 30.4 pg (ref 26.0–34.0)
MCHC: 31.6 g/dL (ref 30.0–36.0)
MCV: 96.2 fL (ref 80.0–100.0)
Monocytes Absolute: 0.7 10*3/uL (ref 0.1–1.0)
Monocytes Relative: 9 %
Neutro Abs: 3.9 10*3/uL (ref 1.7–7.7)
Neutrophils Relative %: 58 %
Platelet Count: 153 10*3/uL (ref 150–400)
RBC: 3.39 MIL/uL — ABNORMAL LOW (ref 3.87–5.11)
RDW: 14.6 % (ref 11.5–15.5)
WBC Count: 6.9 10*3/uL (ref 4.0–10.5)
nRBC: 0 % (ref 0.0–0.2)

## 2022-02-24 LAB — COMPREHENSIVE METABOLIC PANEL
ALT: 12 U/L (ref 0–44)
AST: 22 U/L (ref 15–41)
Albumin: 4.1 g/dL (ref 3.5–5.0)
Alkaline Phosphatase: 56 U/L (ref 38–126)
Anion gap: 9 (ref 5–15)
BUN: 24 mg/dL — ABNORMAL HIGH (ref 8–23)
CO2: 29 mmol/L (ref 22–32)
Calcium: 9.7 mg/dL (ref 8.9–10.3)
Chloride: 105 mmol/L (ref 98–111)
Creatinine, Ser: 0.98 mg/dL (ref 0.44–1.00)
GFR, Estimated: 53 mL/min — ABNORMAL LOW (ref >60)
Glucose, Bld: 99 mg/dL (ref 70–99)
Potassium: 3.5 mmol/L (ref 3.5–5.1)
Sodium: 143 mmol/L (ref 135–145)
Total Bilirubin: 0.5 mg/dL (ref 0.3–1.2)
Total Protein: 6.3 g/dL — ABNORMAL LOW (ref 6.5–8.1)

## 2022-02-24 LAB — FERRITIN: Ferritin: 57 ng/mL (ref 11–307)

## 2022-02-24 NOTE — Patient Instructions (Signed)

## 2022-02-24 NOTE — Progress Notes (Signed)
Hematology and Oncology Follow Up Visit  Makayla Fisher [id number] Jul 02, 1922 88 y.o. 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 is back for followup.  It has been a while since I last saw her.  I think we last saw her back in June.  Since we last saw her, she has had problems with congestive heart failure.  Thankfully, she is being followed by Cardiology and Pulmonary.  She had an echocardiogram back in March.  Her ejection fraction was 50-55%.  She comes in with supplemental oxygen.  I think this does make her feel better.  She is on oral iron right now.  Back in June, she had an iron saturation of 15%.  Her hemoglobin is a little bit low today.  It is lower than what she normally is.  We will have to see what her iron level is.  It is possible that she may not respond to oral iron.  If so, we can give her IV iron which I think we have given her before.  She has had no problems with her colostomy.  She has had no leg swelling.  She has had no bleeding.  There is been no rashes.  She is doing incredibly well for 88 years old.  I did give her some much credit for doing as well as she is doing.  Overall, I would say performance status is probably ECOG 2.    Medications:  Current Outpatient Medications:    amLODipine (NORVASC) 5 MG tablet, Take 1 tablet (5 mg total) by mouth daily., Disp: 30 tablet, Rfl: 2   ARTIFICIAL TEAR OP, Place 1 drop into both eyes as needed (dry eyes)., Disp: , Rfl:    aspirin 81 MG EC tablet, Take 1 tablet (81 mg total) by mouth daily. Swallow whole., Disp: 30 tablet, Rfl: 11   atorvastatin (LIPITOR) 40 MG tablet, Take 1 tablet (40 mg total) by mouth at bedtime., Disp: 30 tablet, Rfl: 30   Calcium  Carb-Cholecalciferol (CALCIUM 600+D3 PO), Take 1 tablet by mouth at bedtime., Disp: , Rfl:    Cholecalciferol (VITAMIN D3 SUPER STRENGTH) 50 MCG (2000 UT) CAPS, Take 4,000 Units by mouth daily. , Disp: , Rfl:    Cranberry 425 MG CAPS, Take 425 mg by mouth daily. , Disp: , Rfl:    docusate sodium (COLACE) 100 MG capsule, Take 100 mg by mouth daily as needed for mild constipation., Disp: , Rfl:    FERREX 150 150 MG capsule, Take by mouth 2 (two) times daily., Disp: , Rfl:    furosemide (LASIX) 20 MG tablet, Take 1 tablet (20 mg total) by mouth daily., Disp: 30 tablet, Rfl: 3   hydrOXYzine (ATARAX) 50 MG tablet, SMARTSIG:1 Tablet(s) By Mouth Every 12 Hours PRN, Disp: , Rfl:    levothyroxine (SYNTHROID, LEVOTHROID) 50 MCG tablet, Take 50 mcg by mouth daily before breakfast. , Disp: , Rfl:    lidocaine-prilocaine (EMLA) cream, Apply 1 application topically See admin instructions. Apply to port-a-cath one hour prior to procedure, Disp: , Rfl:    MUCUS RELIEF 600 MG 12 hr tablet, Take by mouth., Disp: , Rfl:    Multiple Vitamin (DAILY VITE PO), Take 1 tablet by mouth daily., Disp: , Rfl:    Multiple Vitamins-Minerals (PRESERVISION AREDS  PO), Take 1 tablet by mouth in the morning and at bedtime., Disp: , Rfl:    nystatin cream (MYCOSTATIN), Apply 1 application topically every 12 (twelve) hours as needed (to rash between the legs and groin)., Disp: , Rfl:    OXYGEN, Inhale 2 L into the lungs continuous., Disp: , Rfl:    psyllium (METAMUCIL) 58.6 % packet, Take 1 packet by mouth every Monday, Wednesday, and Friday. ORANGE, Disp: , Rfl:    rOPINIRole (REQUIP) 1 MG tablet, Take 1 mg by mouth at bedtime., Disp: , Rfl:    sertraline (ZOLOFT) 25 MG tablet, Take 25 mg by mouth daily., Disp: , Rfl:    timolol (TIMOPTIC) 0.5 % ophthalmic solution, Place 1 drop into both eyes 2 (two) times daily., Disp: , Rfl:    trimethoprim (TRIMPEX) 100 MG tablet, Take 100 mg by mouth daily., Disp: , Rfl:    umeclidinium bromide  (INCRUSE ELLIPTA) 62.5 MCG/ACT AEPB, Inhale 1 puff into the lungs daily., Disp: 30 each, Rfl: 11  Allergies:  Allergies  Allergen Reactions   Epipen [Epinephrine Hcl (Nasal)] Palpitations   Levaquin [Levofloxacin In D5w] Other (See Comments)    Weakness- "Allergic," per MAR   Beta Adrenergic Blockers Other (See Comments)    intolerant to higher doses of BB due to bradycardia.   Chlorhexidine Gluconate Other (See Comments)    Pt declined use and suspects she may have an intolerance   Heparin Other (See Comments)    "Allergic," pre MAR- Maybe affected platelets, per patient Pt has tolerated heparin flush many times since 2015   Sulfa Antibiotics Rash    Past Medical History, Surgical history, Social history, and Family History were reviewed and updated.  Review of Systems: Review of Systems  Constitutional: Negative.   HENT: Negative.    Eyes: Negative.   Respiratory:  Positive for shortness of breath.   Cardiovascular:  Positive for palpitations.  Gastrointestinal:  Positive for diarrhea and nausea.  Genitourinary: Negative.   Musculoskeletal:  Positive for joint pain and myalgias.  Skin: Negative.   Neurological:  Positive for tingling.  Endo/Heme/Allergies: Negative.   Psychiatric/Behavioral: Negative.       Physical Exam:  height is '5\' 2"'$  (1.575 m) and weight is 148 lb (67.1 kg). Her oral temperature is 98 F (36.7 C). Her blood pressure is 175/59 (abnormal) and her pulse is 58 (abnormal). Her respiration is 17 and oxygen saturation is 95%.   Physical Exam Vitals reviewed.  HENT:     Head: Normocephalic and atraumatic.  Eyes:     Pupils: Pupils are equal, round, and reactive to light.  Cardiovascular:     Rate and Rhythm: Normal rate and regular rhythm.     Heart sounds: Normal heart sounds.  Pulmonary:     Effort: Pulmonary effort is normal.     Breath sounds: Normal breath sounds.  Abdominal:     General: Bowel sounds are normal.     Palpations: Abdomen is  soft.     Comments: Abdominal exam shows a colostomy in the left lower quadrant.  She has multiple surgical scars.  She has no fluid wave.  There is no guarding or rebound tenderness.  There is no palpable liver or spleen tip.  Musculoskeletal:        General: No tenderness or deformity. Normal range of motion.     Cervical back: Normal range of motion.  Lymphadenopathy:     Cervical: No cervical adenopathy.  Skin:    General: Skin is  warm and dry.     Findings: No erythema or rash.  Neurological:     Mental Status: She is alert and oriented to person, place, and time.  Psychiatric:        Behavior: Behavior normal.        Thought Content: Thought content normal.        Judgment: Judgment normal.     Lab Results  Component Value Date   WBC 6.9 02/24/2022   HGB 10.3 (L) 02/24/2022   HCT 32.6 (L) 02/24/2022   MCV 96.2 02/24/2022   PLT 153 02/24/2022     Chemistry      Component Value Date/Time   NA 143 02/24/2022 1510   NA 143 01/31/2017 0931   NA 141 05/08/2014 1041   K 3.5 02/24/2022 1510   K 3.7 01/31/2017 0931   K 4.5 05/08/2014 1041   CL 105 02/24/2022 1510   CL 106 01/31/2017 0931   CO2 29 02/24/2022 1510   CO2 27 01/31/2017 0931   CO2 24 05/08/2014 1041   BUN 24 (H) 02/24/2022 1510   BUN 10 01/31/2017 0931   BUN 14.2 05/08/2014 1041   CREATININE 0.98 02/24/2022 1510   CREATININE 0.92 10/21/2021 1359   CREATININE 0.9 01/31/2017 0931   CREATININE 0.8 05/08/2014 1041      Component Value Date/Time   CALCIUM 9.7 02/24/2022 1510   CALCIUM 9.7 01/31/2017 0931   CALCIUM 9.2 05/08/2014 1041   ALKPHOS 56 02/24/2022 1510   ALKPHOS 57 01/31/2017 0931   ALKPHOS 66 05/08/2014 1041   AST 22 02/24/2022 1510   AST 21 10/21/2021 1359   AST 23 05/08/2014 1041   ALT 12 02/24/2022 1510   ALT 11 10/21/2021 1359   ALT 14 01/31/2017 0931   ALT 10 05/08/2014 1041   BILITOT 0.5 02/24/2022 1510   BILITOT 0.5 10/21/2021 1359   BILITOT 0.42 05/08/2014 1041       Impression and Plan: Ms. Klinkner is 88 year old female. She has a follicular large cell lymphoma.  It has been over 5 years since she had completion of her treatment with respect to maintenance Rituxan.  I really think she is cured.  Again, the problem right now might be her anemia.  We will have to see what her iron level is.  I am also sending off an erythropoietin level on her.  It is certainly a lot of fun talking with her.  I am just so happy that she is doing as well as she is doing.  I must say that her assisted living is really doing a great job with her.  For right now, we will still get her back every 6 months.  I know she still has her Port-A-Cath in that we need to have flushed every 2 months.   [name], MD 11/2/20234:36 PM

## 2022-02-25 LAB — IRON AND IRON BINDING CAPACITY (CC-WL,HP ONLY)
Iron: 56 ug/dL (ref 28–170)
Saturation Ratios: 20 % (ref 10.4–31.8)
TIBC: 281 ug/dL (ref 250–450)
UIBC: 225 ug/dL (ref 148–442)

## 2022-03-02 ENCOUNTER — Telehealth: Payer: Self-pay | Admitting: *Deleted

## 2022-03-02 ENCOUNTER — Ambulatory Visit: Payer: Medicare Other

## 2022-03-02 NOTE — Telephone Encounter (Signed)
Called patient and waiting for callback to schedule (1) doses of IV Iron

## 2022-03-08 ENCOUNTER — Ambulatory Visit: Payer: Medicare Other

## 2022-03-10 ENCOUNTER — Ambulatory Visit (INDEPENDENT_AMBULATORY_CARE_PROVIDER_SITE_OTHER): Payer: Medicare Other | Admitting: Pulmonary Disease

## 2022-03-10 ENCOUNTER — Inpatient Hospital Stay: Payer: Medicare Other

## 2022-03-10 ENCOUNTER — Encounter: Payer: Self-pay | Admitting: Pulmonary Disease

## 2022-03-10 VITALS — BP 134/72 | HR 55 | Ht 62.0 in | Wt 145.0 lb

## 2022-03-10 DIAGNOSIS — J432 Centrilobular emphysema: Secondary | ICD-10-CM | POA: Diagnosis not present

## 2022-03-10 DIAGNOSIS — Z9981 Dependence on supplemental oxygen: Secondary | ICD-10-CM

## 2022-03-10 DIAGNOSIS — J9611 Chronic respiratory failure with hypoxia: Secondary | ICD-10-CM | POA: Diagnosis not present

## 2022-03-10 MED ORDER — IPRATROPIUM-ALBUTEROL 0.5-2.5 (3) MG/3ML IN SOLN: 3.0000 mL | Freq: Two times a day (BID) | RESPIRATORY_TRACT | 1 refills | Status: DC

## 2022-03-10 NOTE — Progress Notes (Signed)
Synopsis: Referred in April 2023 for respiratory failure by Farrel Conners, NP  Subjective:   PATIENT ID: Makayla Fisher GENDER: female DOB: 1923/01/19, MRN: [id number]   HPI  No chief complaint on file.  Makayla Fisher is a 88 year old woman, former smoker with history of CAD s/p CABG, GERD, non-hodgkin's lymphoma and CVA who returns to pulmonary clinic for respiratory failure and emphysema.   She has been doing ok since last visit. She continues to have exertional dyspnea. She has been using incruse 1 puff daily. She is requesting more physical therapy sessions.   OV 11/15/21 She was started on incruse inhaler at last visit in place of spiriva due to insurance. She reports progressive weakness as she is not as active as she would like. She also describes concern for possible depression as she is not interested in her facilities social activities anymore.   She had lab work done at her facility recently for her fatigue and dyspnea.  Initial OV 08/09/21 She reports needing supplemental oxygen at all times now and is currently using 2L. She denies shortness of breath at rest but mainly with exertion. She is not experiencing wheezing or coughing.   She was previously using spiriva handihaler which was switched to spiriva respimat which she does not like.   She is widowed. She is a former smoker with 25 pack year smoking history.   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   Stroke-like symptoms s/p IV tPA 11/28/2019   TIA (transient ischemic attack)      Family History  Problem Relation Age of Onset   Diverticulitis Mother    Heart attack Mother    Ulcers Father      Social History   Socioeconomic History   Marital status: Widowed    Spouse name: Not on file   Number of children: Not on file   Years of education: Not on file   Highest education level: Not on file  Occupational History   Not on file  Tobacco Use   Smoking status: Former    Packs/day: 1.00    Years: 25.00    Total pack years: 25.00    Types: Cigarettes    Start date: 05/16/1948    Quit date: 05/17/1963    Years since quitting: 58.8   Smokeless tobacco: Never   Tobacco comments:    quit smoking 50 years ago  Vaping Use   Vaping Use: Never used  Substance and Sexual Activity   Alcohol use: No    Alcohol/week: 0.0 standard drinks of alcohol   Drug use: Yes    Types: PCP   Sexual activity: Not Currently  Other Topics Concern   Not on file  Social History Narrative   Not on file   Social Determinants of Health   Financial Resource Strain: Not on file  Food Insecurity: Not on file  Transportation Needs: Not  on file  Physical Activity: Not on file  Stress: Not on file  Social Connections: Not on file  Intimate Partner Violence: Not on file     Allergies  Allergen Reactions   Epipen [Epinephrine Hcl (Nasal)] Palpitations   Levaquin [Levofloxacin In D5w] Other (See Comments)    Weakness- "Allergic," per [facility]   Beta Adrenergic Blockers Other (See Comments)    intolerant to higher doses of BB due to bradycardia.   Chlorhexidine Gluconate Other (See Comments)    Pt declined use and suspects she may have an intolerance   Heparin Other (See Comments)    "Allergic," pre MAR- Maybe affected platelets, per patient Pt has tolerated heparin flush many times since 2015   Sulfa Antibiotics Rash     Outpatient Medications  Prior to Visit  Medication Sig Dispense Refill   amLODipine (NORVASC) 5 MG tablet Take 1 tablet (5 mg total) by mouth daily. 30 tablet 2   ARTIFICIAL TEAR OP Place 1 drop into both eyes as needed (dry eyes).     aspirin 81 MG EC tablet Take 1 tablet (81 mg total) by mouth daily. Swallow whole. 30 tablet 11   atorvastatin (LIPITOR) 40 MG tablet Take 1 tablet (40 mg total) by mouth at bedtime. 30 tablet 30   Calcium Carb-Cholecalciferol (CALCIUM 600+D3 PO) Take 1 tablet by mouth at bedtime.     Cholecalciferol (VITAMIN D3 SUPER STRENGTH) 50 MCG (2000 UT) CAPS Take 4,000 Units by mouth daily.      Cranberry 425 MG CAPS Take 425 mg by mouth daily.      docusate sodium (COLACE) 100 MG capsule Take 100 mg by mouth daily as needed for mild constipation.     FERREX 150 150 MG capsule Take by mouth 2 (two) times daily.     furosemide (LASIX) 20 MG tablet Take 1 tablet (20 mg total) by mouth daily. 30 tablet 3   hydrOXYzine (ATARAX) 50 MG tablet SMARTSIG:1 Tablet(s) By Mouth Every 12 Hours PRN     levothyroxine (SYNTHROID) 25 MCG tablet Take 25 mcg by mouth daily before breakfast.     lidocaine-prilocaine (EMLA) cream Apply 1 application topically See admin instructions. Apply to port-a-cath one hour prior to procedure     MUCUS RELIEF 600 MG 12 hr tablet Take by mouth.     Multiple Vitamin (DAILY VITE PO) Take 1 tablet by mouth daily.     Multiple Vitamins-Minerals (PRESERVISION AREDS PO) Take 1 tablet by mouth in the morning and at bedtime.     nystatin cream (MYCOSTATIN) Apply 1 application topically every 12 (twelve) hours as needed (to rash between the legs and groin).     OXYGEN Inhale 2 L into the lungs continuous.     psyllium (METAMUCIL) 58.6 % packet Take 1 packet by mouth every Monday, Wednesday, and Friday. ORANGE     rOPINIRole (REQUIP) 1 MG tablet Take 1 mg by mouth at bedtime.     sertraline (ZOLOFT) 25 MG tablet Take 25 mg by mouth daily.     timolol (TIMOPTIC) 0.5 % ophthalmic solution  Place 1 drop into both eyes 2 (two) times daily.     trimethoprim (TRIMPEX) 100 MG tablet Take 100 mg by mouth daily.     umeclidinium bromide (INCRUSE ELLIPTA) 62.5 MCG/ACT AEPB Inhale 1 puff into the lungs daily. 30 each 11   levothyroxine (SYNTHROID, LEVOTHROID) 50 MCG tablet Take 50 mcg by mouth daily before breakfast.      No facility-administered medications prior  to visit.   Review of Systems  Constitutional:  Positive for malaise/fatigue. Negative for chills, fever and weight loss.  HENT:  Negative for congestion, sinus pain and sore throat.   Eyes: Negative.   Respiratory:  Positive for shortness of breath. Negative for cough, hemoptysis, sputum production and wheezing.   Cardiovascular:  Negative for chest pain, palpitations, orthopnea, claudication and leg swelling.  Gastrointestinal:  Negative for abdominal pain, heartburn, nausea and vomiting.  Genitourinary: Negative.   Musculoskeletal:  Negative for joint pain and myalgias.  Skin:  Negative for rash.  Neurological:  Negative for weakness.  Endo/Heme/Allergies: Negative.   Psychiatric/Behavioral: Negative.      Objective:   Vitals:   03/10/22 1445  BP: 134/72  Pulse: (!) 55  SpO2: 96%  Weight: 145 lb (65.8 kg)  Height: '5\' 2"'$  (1.575 m)   Physical Exam Constitutional:      General: She is not in acute distress.    Appearance: She is not ill-appearing.  HENT:     Head: Normocephalic and atraumatic.  Cardiovascular:     Rate and Rhythm: Normal rate and regular rhythm.     Pulses: Normal pulses.     Heart sounds: Normal heart sounds. No murmur heard. Pulmonary:     Effort: Pulmonary effort is normal.     Breath sounds: Rales present. No wheezing or rhonchi.  Musculoskeletal:     Right lower leg: No edema.     Left lower leg: No edema.  Skin:    General: Skin is warm and dry.  Neurological:     General: No focal deficit present.     Mental Status: She is alert.  Psychiatric:        Mood and Affect: Mood  normal.        Behavior: Behavior normal.        Thought Content: Thought content normal.        Judgment: Judgment normal.    CBC    Component Value Date/Time   WBC 6.9 02/24/2022 1510   WBC 15.7 (H) 07/10/2021 0215   RBC 3.39 (L) 02/24/2022 1510   HGB 10.3 (L) 02/24/2022 1510   HGB 12.5 01/31/2017 0931   HGB 12.4 07/21/2016 1046   HCT 32.6 (L) 02/24/2022 1510   HCT 37.8 01/31/2017 0931   HCT 37.5 07/21/2016 1046   PLT 153 02/24/2022 1510   PLT 153 01/31/2017 0931   PLT 161 07/21/2016 1046   MCV 96.2 02/24/2022 1510   MCV 98 01/31/2017 0931   MCV 97.1 07/21/2016 1046   MCH 30.4 02/24/2022 1510   MCHC 31.6 02/24/2022 1510   RDW 14.6 02/24/2022 1510   RDW 14.6 01/31/2017 0931   RDW 16.3 (H) 07/21/2016 1046   LYMPHSABS 2.0 02/24/2022 1510   LYMPHSABS 1.5 01/31/2017 0931   LYMPHSABS 1.0 07/21/2016 1046   MONOABS 0.7 02/24/2022 1510   MONOABS 0.9 07/21/2016 1046   EOSABS 0.2 02/24/2022 1510   EOSABS 0.2 01/31/2017 0931   BASOSABS 0.1 02/24/2022 1510   BASOSABS 0.0 01/31/2017 0931   BASOSABS 0.1 07/21/2016 1046      Latest Ref Rng & Units 02/24/2022    3:10 PM 10/21/2021    1:59 PM 09/27/2021    3:51 PM  BMP  Glucose 70 - 99 mg/dL 99  114  99   BUN 8 - 23 mg/dL '24  17  19   '$ Creatinine 0.44 - 1.00 mg/dL 0.98  0.92  0.91   Sodium 135 - 145 mmol/L 143  139  142   Potassium 3.5 - 5.1 mmol/L 3.5  3.4  3.5   Chloride 98 - 111 mmol/L 105  100  103   CO2 22 - 32 mmol/L '29  29  29   '$ Calcium 8.9 - 10.3 mg/dL 9.7  10.1  9.6    Chest imaging: CT Chest 07/10/21 1. Interval development of patchy left upper lobe peribronchovascular ground-glass airspace opacities as well as a cluster of nodular-like airspace opacities within the left lower lobe. Findings suggestive of multifocal infection. Recommend follow-up CT in 3 months to evaluate for resolution and exclude any underlying lesion. 2. Stable mediastinal lymphadenopathy and query left hilar lymphadenopathy, noting limited  sensitivity for the detection of hilar adenopathy on this noncontrast study. 3. Slight interval increase in size of trace bilateral, left greater than right pleural effusions. 4.  Aortic Atherosclerosis  PFT:     No data to display         Labs:  Path:  Echo 06/2020: LV EF 50-55%. Grade II diastolic dysfunction. RV systolic function is normal. Mildly elevated PASP. LA severely dilated. RA moderately dilated.   Heart Catheterization:  Assessment & Plan:   Chronic respiratory failure with hypoxia, on home O2 therapy (HCC) - 2 L/min continuous  Centrilobular emphysema ([place]) - Plan: Ambulatory Referral for DME  Discussion: Makayla Fisher is a 88 year old woman, former smoker with history of CAD s/p CABG, GERD, non-hodgkin's lymphoma and CVA who returns to pulmonary clinic for respiratory failure and emphysema.    She is to continue incruse inhaler for her emphysema. She is to continue supplemental oxygen at this time. We will order her a nebuzlier machine and duoneb solution to use twice daily as needed for shortness of breath.   We will order her physical therapy at her facility.  Follow up in 6 months.  Freda Jackson, MD [facility] Office: [phone]    Current Outpatient Medications:    amLODipine (NORVASC) 5 MG tablet, Take 1 tablet (5 mg total) by mouth daily., Disp: 30 tablet, Rfl: 2   ARTIFICIAL TEAR OP, Place 1 drop into both eyes as needed (dry eyes)., Disp: , Rfl:    aspirin 81 MG EC tablet, Take 1 tablet (81 mg total) by mouth daily. Swallow whole., Disp: 30 tablet, Rfl: 11   atorvastatin (LIPITOR) 40 MG tablet, Take 1 tablet (40 mg total) by mouth at bedtime., Disp: 30 tablet, Rfl: 30   Calcium Carb-Cholecalciferol (CALCIUM 600+D3 PO), Take 1 tablet by mouth at bedtime., Disp: , Rfl:    Cholecalciferol (VITAMIN D3 SUPER STRENGTH) 50 MCG (2000 UT) CAPS, Take 4,000 Units by mouth daily. , Disp: , Rfl:    Cranberry 425 MG CAPS, Take  425 mg by mouth daily. , Disp: , Rfl:    docusate sodium (COLACE) 100 MG capsule, Take 100 mg by mouth daily as needed for mild constipation., Disp: , Rfl:    FERREX 150 150 MG capsule, Take by mouth 2 (two) times daily., Disp: , Rfl:    furosemide (LASIX) 20 MG tablet, Take 1 tablet (20 mg total) by mouth daily., Disp: 30 tablet, Rfl: 3   hydrOXYzine (ATARAX) 50 MG tablet, SMARTSIG:1 Tablet(s) By Mouth Every 12 Hours PRN, Disp: , Rfl:    ipratropium-albuterol (DUONEB) 0.5-2.5 (3) MG/3ML SOLN, Take 3 mLs by nebulization every 12 (twelve) hours., Disp: 360 mL, Rfl: 1   levothyroxine (SYNTHROID) 25 MCG tablet, Take 25 mcg by mouth daily before breakfast., Disp: , Rfl:  lidocaine-prilocaine (EMLA) cream, Apply 1 application topically See admin instructions. Apply to port-a-cath one hour prior to procedure, Disp: , Rfl:    MUCUS RELIEF 600 MG 12 hr tablet, Take by mouth., Disp: , Rfl:    Multiple Vitamin (DAILY VITE PO), Take 1 tablet by mouth daily., Disp: , Rfl:    Multiple Vitamins-Minerals (PRESERVISION AREDS PO), Take 1 tablet by mouth in the morning and at bedtime., Disp: , Rfl:    nystatin cream (MYCOSTATIN), Apply 1 application topically every 12 (twelve) hours as needed (to rash between the legs and groin)., Disp: , Rfl:    OXYGEN, Inhale 2 L into the lungs continuous., Disp: , Rfl:    psyllium (METAMUCIL) 58.6 % packet, Take 1 packet by mouth every Monday, Wednesday, and Friday. ORANGE, Disp: , Rfl:    rOPINIRole (REQUIP) 1 MG tablet, Take 1 mg by mouth at bedtime., Disp: , Rfl:    sertraline (ZOLOFT) 25 MG tablet, Take 25 mg by mouth daily., Disp: , Rfl:    timolol (TIMOPTIC) 0.5 % ophthalmic solution, Place 1 drop into both eyes 2 (two) times daily., Disp: , Rfl:    trimethoprim (TRIMPEX) 100 MG tablet, Take 100 mg by mouth daily., Disp: , Rfl:    umeclidinium bromide (INCRUSE ELLIPTA) 62.5 MCG/ACT AEPB, Inhale 1 puff into the lungs daily., Disp: 30 each, Rfl: 11

## 2022-03-10 NOTE — Patient Instructions (Addendum)
We will order you a nebulizer machine and supplies  Start duoneb nebulizer treatments twice daily  Continue trelegy ellipta 1 puff daily  We will write you for physical therapy again as well  Follow up in 6 months

## 2022-03-18 ENCOUNTER — Encounter: Payer: Self-pay | Admitting: Pulmonary Disease

## 2022-03-24 ENCOUNTER — Inpatient Hospital Stay: Payer: Medicare Other

## 2022-03-24 VITALS — BP 154/49 | HR 52 | Temp 98.0°F | Resp 16

## 2022-03-24 DIAGNOSIS — Z452 Encounter for adjustment and management of vascular access device: Secondary | ICD-10-CM | POA: Diagnosis not present

## 2022-03-24 DIAGNOSIS — K9403 Colostomy malfunction: Secondary | ICD-10-CM

## 2022-03-24 DIAGNOSIS — K56699 Other intestinal obstruction unspecified as to partial versus complete obstruction: Secondary | ICD-10-CM

## 2022-03-24 DIAGNOSIS — D500 Iron deficiency anemia secondary to blood loss (chronic): Secondary | ICD-10-CM

## 2022-03-24 DIAGNOSIS — C8253 Diffuse follicle center lymphoma, intra-abdominal lymph nodes: Secondary | ICD-10-CM

## 2022-03-24 MED ORDER — SODIUM CHLORIDE 0.9 % IV SOLN: Freq: Once | INTRAVENOUS | Status: AC

## 2022-03-24 MED ORDER — SODIUM CHLORIDE 0.9 % IV SOLN
510.0000 mg | Freq: Once | INTRAVENOUS | Status: AC
  Administered 2022-03-24: 510 mg via INTRAVENOUS
  Filled 2022-03-24: qty 17

## 2022-03-24 NOTE — Patient Instructions (Signed)

## 2022-04-11 ENCOUNTER — Inpatient Hospital Stay: Payer: Medicare Other | Attending: Hematology & Oncology

## 2022-04-11 DIAGNOSIS — Z452 Encounter for adjustment and management of vascular access device: Secondary | ICD-10-CM | POA: Diagnosis present

## 2022-04-11 DIAGNOSIS — Z8572 Personal history of non-Hodgkin lymphomas: Secondary | ICD-10-CM | POA: Diagnosis present

## 2022-04-11 DIAGNOSIS — D508 Other iron deficiency anemias: Secondary | ICD-10-CM | POA: Insufficient documentation

## 2022-04-11 DIAGNOSIS — Z95828 Presence of other vascular implants and grafts: Secondary | ICD-10-CM

## 2022-04-11 MED ORDER — SODIUM CHLORIDE 0.9% FLUSH
10.0000 mL | Freq: Once | INTRAVENOUS | Status: AC
  Administered 2022-04-11: 10 mL via INTRAVENOUS

## 2022-04-11 MED ORDER — HEPARIN SOD (PORK) LOCK FLUSH 100 UNIT/ML IV SOLN
500.0000 [IU] | Freq: Once | INTRAVENOUS | Status: AC
  Administered 2022-04-11: 500 [IU] via INTRAVENOUS

## 2022-04-11 NOTE — Patient Instructions (Signed)

## 2022-05-16 ENCOUNTER — Telehealth: Payer: Self-pay | Admitting: Pulmonary Disease

## 2022-05-16 NOTE — Telephone Encounter (Signed)
Rehab calling stating they sent over a fax'd req for a "Updated Plan of Care" a few weeks ago (04/25/2022)  and have not heard back. Please call @ [phone]   They have the referral from [place] to  evaluate and Treat., Specifically needs" PT and OT Evaluation"   Rehab #:   Jacinta Shoe [phone]

## 2022-05-18 NOTE — Telephone Encounter (Signed)
Cherina,  Do you know by chance if you received a PT and PT order for this patient. It states it was a fax. If not I will call and ask them to refax it and attn it to you or Dr Erin Fulling.  Thank you

## 2022-05-20 NOTE — Telephone Encounter (Signed)
Makayla Fisher is returning phone call. [facility] refaxed the orders to be signed to our office. [facility] phone number is [phone].

## 2022-05-20 NOTE — Telephone Encounter (Signed)
I have not received anything for this patient.   Called and left a detailed message for [employer] to fax the forms to the TL fax.   Will leave this encounter open for follow up.

## 2022-05-25 NOTE — Telephone Encounter (Signed)
Forms have been signed and faxed back to [facility] attention. Will await confirmation fax.

## 2022-05-25 NOTE — Telephone Encounter (Signed)
Received a confirmation fax. Will close encounter.

## 2022-08-29 ENCOUNTER — Inpatient Hospital Stay: Payer: Medicare Other | Attending: Hematology & Oncology

## 2022-08-29 ENCOUNTER — Encounter: Payer: Self-pay | Admitting: Medical Oncology

## 2022-08-29 ENCOUNTER — Inpatient Hospital Stay (HOSPITAL_BASED_OUTPATIENT_CLINIC_OR_DEPARTMENT_OTHER): Payer: Medicare Other | Admitting: Medical Oncology

## 2022-08-29 ENCOUNTER — Inpatient Hospital Stay: Payer: Medicare Other

## 2022-08-29 VITALS — BP 164/46 | HR 53 | Temp 98.1°F | Resp 22 | Wt 148.0 lb

## 2022-08-29 DIAGNOSIS — I509 Heart failure, unspecified: Secondary | ICD-10-CM | POA: Diagnosis not present

## 2022-08-29 DIAGNOSIS — Z9221 Personal history of antineoplastic chemotherapy: Secondary | ICD-10-CM | POA: Diagnosis not present

## 2022-08-29 DIAGNOSIS — D500 Iron deficiency anemia secondary to blood loss (chronic): Secondary | ICD-10-CM

## 2022-08-29 DIAGNOSIS — D509 Iron deficiency anemia, unspecified: Secondary | ICD-10-CM | POA: Diagnosis present

## 2022-08-29 DIAGNOSIS — C8203 Follicular lymphoma grade I, intra-abdominal lymph nodes: Secondary | ICD-10-CM

## 2022-08-29 DIAGNOSIS — Z8572 Personal history of non-Hodgkin lymphomas: Secondary | ICD-10-CM | POA: Diagnosis present

## 2022-08-29 DIAGNOSIS — Z79899 Other long term (current) drug therapy: Secondary | ICD-10-CM | POA: Diagnosis not present

## 2022-08-29 LAB — CBC WITH DIFFERENTIAL (CANCER CENTER ONLY)
Abs Immature Granulocytes: 0.08 10*3/uL — ABNORMAL HIGH (ref 0.00–0.07)
Basophils Absolute: 0.1 10*3/uL (ref 0.0–0.1)
Basophils Relative: 1 %
Eosinophils Absolute: 0.3 10*3/uL (ref 0.0–0.5)
Eosinophils Relative: 4 %
HCT: 33.5 % — ABNORMAL LOW (ref 36.0–46.0)
Hemoglobin: 10.7 g/dL — ABNORMAL LOW (ref 12.0–15.0)
Immature Granulocytes: 1 %
Lymphocytes Relative: 25 %
Lymphs Abs: 2 10*3/uL (ref 0.7–4.0)
MCH: 30.9 pg (ref 26.0–34.0)
MCHC: 31.9 g/dL (ref 30.0–36.0)
MCV: 96.8 fL (ref 80.0–100.0)
Monocytes Absolute: 1 10*3/uL (ref 0.1–1.0)
Monocytes Relative: 12 %
Neutro Abs: 4.7 10*3/uL (ref 1.7–7.7)
Neutrophils Relative %: 57 %
Platelet Count: 144 10*3/uL — ABNORMAL LOW (ref 150–400)
RBC: 3.46 MIL/uL — ABNORMAL LOW (ref 3.87–5.11)
RDW: 15 % (ref 11.5–15.5)
WBC Count: 8.2 10*3/uL (ref 4.0–10.5)
nRBC: 0 % (ref 0.0–0.2)

## 2022-08-29 LAB — CMP (CANCER CENTER ONLY)
ALT: 15 U/L (ref 0–44)
AST: 25 U/L (ref 15–41)
Albumin: 4.3 g/dL (ref 3.5–5.0)
Alkaline Phosphatase: 56 U/L (ref 38–126)
Anion gap: 13 (ref 5–15)
BUN: 33 mg/dL — ABNORMAL HIGH (ref 8–23)
CO2: 27 mmol/L (ref 22–32)
Calcium: 9.7 mg/dL (ref 8.9–10.3)
Chloride: 103 mmol/L (ref 98–111)
Creatinine: 0.99 mg/dL (ref 0.44–1.00)
GFR, Estimated: 52 mL/min — ABNORMAL LOW (ref >60)
Glucose, Bld: 112 mg/dL — ABNORMAL HIGH (ref 70–99)
Potassium: 3.8 mmol/L (ref 3.5–5.1)
Sodium: 143 mmol/L (ref 135–145)
Total Bilirubin: 0.4 mg/dL (ref 0.3–1.2)
Total Protein: 6.5 g/dL (ref 6.5–8.1)

## 2022-08-29 LAB — RETICULOCYTES
Immature Retic Fract: 19.9 % — ABNORMAL HIGH (ref 2.3–15.9)
RBC.: 3.52 MIL/uL — ABNORMAL LOW (ref 3.87–5.11)
Retic Count, Absolute: 63.4 10*3/uL (ref 19.0–186.0)
Retic Ct Pct: 1.8 % (ref 0.4–3.1)

## 2022-08-29 LAB — LACTATE DEHYDROGENASE: LDH: 209 U/L — ABNORMAL HIGH (ref 98–192)

## 2022-08-29 LAB — FERRITIN: Ferritin: 45 ng/mL (ref 11–307)

## 2022-08-29 MED ORDER — SODIUM CHLORIDE 0.9% FLUSH: 10.0000 mL | Freq: Once | INTRAVENOUS | Status: DC

## 2022-08-29 NOTE — Progress Notes (Signed)
Hematology and Oncology Follow Up Visit  Makayla Fisher [id number] 1922/10/03 89 y.o. 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 is back for followup. We last saw her about 6 months ago. We currently also follow her for some IDA which is thought to be secondary to malabsorption.   Since her last visit she continues to struggle with congestive heart failure.  Thankfully, she is being followed by Cardiology and Pulmonary. Current vitals are stable per patient. Other than some fatigue she is feeling well. She comes in with supplemental oxygen.  She denies any new SOB. She denies unintentional weight loss, lymphadenopathy, chest pain, fevers, night sweats.   Overall, I would say performance status is probably ECOG 2.    Medications:  Current Outpatient Medications:    amLODipine (NORVASC) 5 MG tablet, Take 1 tablet (5 mg total) by mouth daily., Disp: 30 tablet, Rfl: 2   ARTIFICIAL TEAR OP, Place 1 drop into both eyes as needed (dry eyes)., Disp: , Rfl:    aspirin 81 MG EC tablet, Take 1 tablet (81 mg total) by mouth daily. Swallow whole., Disp: 30 tablet, Rfl: 11   atorvastatin (LIPITOR) 40 MG tablet, Take 1 tablet (40 mg total) by mouth at bedtime., Disp: 30 tablet, Rfl: 30   Calcium Carb-Cholecalciferol (CALCIUM 600+D3 PO), Take 1 tablet by mouth at bedtime., Disp: , Rfl:    Cholecalciferol (VITAMIN D3 SUPER STRENGTH) 50 MCG (2000 UT) CAPS, Take 4,000 Units by mouth daily. , Disp: , Rfl:    Cranberry 425 MG CAPS, Take 425 mg by mouth daily. , Disp: , Rfl:    docusate sodium (COLACE) 100 MG capsule, Take 100 mg by mouth daily as needed for mild constipation., Disp: , Rfl:    FERREX 150 150 MG capsule, Take by mouth 2 (two) times daily., Disp: , Rfl:     furosemide (LASIX) 20 MG tablet, Take 1 tablet (20 mg total) by mouth daily., Disp: 30 tablet, Rfl: 3   hydrOXYzine (ATARAX) 50 MG tablet, SMARTSIG:1 Tablet(s) By Mouth Every 12 Hours PRN, Disp: , Rfl:    ipratropium-albuterol (DUONEB) 0.5-2.5 (3) MG/3ML SOLN, Take 3 mLs by nebulization every 12 (twelve) hours., Disp: 360 mL, Rfl: 1   levothyroxine (SYNTHROID) 25 MCG tablet, Take 25 mcg by mouth daily before breakfast., Disp: , Rfl:    lidocaine-prilocaine (EMLA) cream, Apply 1 application topically See admin instructions. Apply to port-a-cath one hour prior to procedure, Disp: , Rfl:    MUCUS RELIEF 600 MG 12 hr tablet, Take by mouth., Disp: , Rfl:    Multiple Vitamin (DAILY VITE PO), Take 1 tablet by mouth daily., Disp: , Rfl:    Multiple Vitamins-Minerals (PRESERVISION AREDS PO), Take 1 tablet by mouth in the morning and at bedtime., Disp: , Rfl:    nystatin cream (MYCOSTATIN), Apply 1 application topically every 12 (twelve) hours as needed (to rash between the legs and groin)., Disp: , Rfl:    OXYGEN, Inhale 2 L into the lungs continuous., Disp: , Rfl:    psyllium (METAMUCIL) 58.6 % packet, Take 1 packet by mouth every Monday, Wednesday, and Friday. ORANGE, Disp: , Rfl:    rOPINIRole (REQUIP) 1 MG tablet, Take 1 mg by mouth at bedtime., Disp: , Rfl:    sertraline (ZOLOFT)  25 MG tablet, Take 25 mg by mouth daily., Disp: , Rfl:    timolol (TIMOPTIC) 0.5 % ophthalmic solution, Place 1 drop into both eyes 2 (two) times daily., Disp: , Rfl:    trimethoprim (TRIMPEX) 100 MG tablet, Take 100 mg by mouth daily., Disp: , Rfl:    umeclidinium bromide (INCRUSE ELLIPTA) 62.5 MCG/ACT AEPB, Inhale 1 puff into the lungs daily., Disp: 30 each, Rfl: 11  Allergies:  Allergies  Allergen Reactions   Epipen [Epinephrine Hcl (Nasal)] Palpitations   Levaquin [Levofloxacin In D5w] Other (See Comments)    Weakness- "Allergic," per [facility]   Beta Adrenergic Blockers Other (See Comments)    intolerant to higher  doses of BB due to bradycardia.   Chlorhexidine Gluconate Other (See Comments)    Pt declined use and suspects she may have an intolerance   Heparin Other (See Comments)    "Allergic," pre MAR- Maybe affected platelets, per patient Pt has tolerated heparin flush many times since 2015   Sulfa Antibiotics Rash    Past Medical History, Surgical history, Social history, and Family History were reviewed and updated.  Review of Systems: Review of Systems  Constitutional: Negative.   HENT: Negative.    Eyes: Negative.   Respiratory:  Positive for shortness of breath.   Cardiovascular:  Positive for palpitations.  Gastrointestinal:  Positive for diarrhea and nausea.  Genitourinary: Negative.   Musculoskeletal:  Positive for joint pain and myalgias.  Skin: Negative.   Neurological:  Positive for tingling.  Endo/Heme/Allergies: Negative.   Psychiatric/Behavioral: Negative.       Physical Exam:  weight is 148 lb (67.1 kg). Her oral temperature is 98.1 F (36.7 C). Her blood pressure is 164/46 (abnormal) and her pulse is 53 (abnormal). Her respiration is 22 (abnormal) and oxygen saturation is 97%.   Physical Exam Vitals reviewed.  Constitutional:      Comments: Ambulating on her own with rolling walker- has on portable oxygen via nasal canula.   HENT:     Head: Normocephalic and atraumatic.  Eyes:     Pupils: Pupils are equal, round, and reactive to light.  Cardiovascular:     Rate and Rhythm: Normal rate and regular rhythm.     Heart sounds: Normal heart sounds.  Pulmonary:     Effort: Pulmonary effort is normal.     Breath sounds: Normal breath sounds.  Abdominal:     General: Bowel sounds are normal.     Palpations: Abdomen is soft.  Musculoskeletal:        General: No tenderness or deformity. Normal range of motion.     Cervical back: Normal range of motion.  Lymphadenopathy:     Cervical: No cervical adenopathy.  Skin:    General: Skin is warm and dry.     Findings:  No erythema or rash.  Neurological:     Mental Status: She is alert and oriented to person, place, and time.  Psychiatric:        Behavior: Behavior normal.        Thought Content: Thought content normal.        Judgment: Judgment normal.      Lab Results  Component Value Date   WBC 8.2 08/29/2022   HGB 10.7 (L) 08/29/2022   HCT 33.5 (L) 08/29/2022   MCV 96.8 08/29/2022   PLT 144 (L) 08/29/2022     Chemistry      Component Value Date/Time   NA 143 02/24/2022 1510   NA 143 01/31/2017  0931   NA 141 05/08/2014 1041   K 3.5 02/24/2022 1510   K 3.7 01/31/2017 0931   K 4.5 05/08/2014 1041   CL 105 02/24/2022 1510   CL 106 01/31/2017 0931   CO2 29 02/24/2022 1510   CO2 27 01/31/2017 0931   CO2 24 05/08/2014 1041   BUN 24 (H) 02/24/2022 1510   BUN 10 01/31/2017 0931   BUN 14.2 05/08/2014 1041   CREATININE 0.98 02/24/2022 1510   CREATININE 0.92 10/21/2021 1359   CREATININE 0.9 01/31/2017 0931   CREATININE 0.8 05/08/2014 1041      Component Value Date/Time   CALCIUM 9.7 02/24/2022 1510   CALCIUM 9.7 01/31/2017 0931   CALCIUM 9.2 05/08/2014 1041   ALKPHOS 56 02/24/2022 1510   ALKPHOS 57 01/31/2017 0931   ALKPHOS 66 05/08/2014 1041   AST 22 02/24/2022 1510   AST 21 10/21/2021 1359   AST 23 05/08/2014 1041   ALT 12 02/24/2022 1510   ALT 11 10/21/2021 1359   ALT 14 01/31/2017 0931   ALT 10 05/08/2014 1041   BILITOT 0.5 02/24/2022 1510   BILITOT 0.5 10/21/2021 1359   BILITOT 0.42 05/08/2014 1041      Impression and Plan: Makayla Fisher is 89 year old female. She has a follicular large cell lymphoma.  It has been over 5 years since she had completion of her treatment with respect to maintenance Rituxan. She is suspected to be cured by Dr. Rexene Edison.   Today she has no sign of recurrence of her lymphoma.   Her anemia appears stable. Hgb today is 10.7 it was 10.3 6 months ago. MCV is normal. WBC stable at 8.2. Platelets are 144 which is stable for patient. Immature retics  are high- she may benefit from IV iron- will find out once iron studies have resulted.   For right now, we will still get her back every 6 months.  Of note, she still has a Port-A-Cath in that we need to have flushed every 2 months.  Disposition: RTC APP, port labs (CBC w/, CMP, ferritin, iron, LDH, retics) RTC every 2 months for port flushes -[place]  Rushie Chestnut, PA-C 5/6/20242:01 PM

## 2022-08-29 NOTE — Patient Instructions (Signed)

## 2022-08-30 LAB — IRON AND IRON BINDING CAPACITY (CC-WL,HP ONLY)
Iron: 50 ug/dL (ref 28–170)
Saturation Ratios: 16 % (ref 10.4–31.8)
TIBC: 307 ug/dL (ref 250–450)
UIBC: 257 ug/dL

## 2022-12-19 ENCOUNTER — Ambulatory Visit: Payer: Medicare Other | Admitting: Physician Assistant

## 2023-01-12 ENCOUNTER — Emergency Department (HOSPITAL_BASED_OUTPATIENT_CLINIC_OR_DEPARTMENT_OTHER): Payer: Medicare Other

## 2023-01-12 ENCOUNTER — Other Ambulatory Visit: Payer: Self-pay

## 2023-01-12 ENCOUNTER — Inpatient Hospital Stay (HOSPITAL_BASED_OUTPATIENT_CLINIC_OR_DEPARTMENT_OTHER)
Admission: EM | Admit: 2023-01-12 | Discharge: 2023-01-16 | DRG: 812 | Disposition: A | Discharge status: Nursing Home (Includes ICF) | Payer: Medicare Other | Source: Skilled Nursing Facility | Attending: Family Medicine | Admitting: Family Medicine

## 2023-01-12 ENCOUNTER — Encounter (HOSPITAL_BASED_OUTPATIENT_CLINIC_OR_DEPARTMENT_OTHER): Payer: Self-pay | Admitting: Emergency Medicine

## 2023-01-12 DIAGNOSIS — I2510 Atherosclerotic heart disease of native coronary artery without angina pectoris: Secondary | ICD-10-CM | POA: Diagnosis present

## 2023-01-12 DIAGNOSIS — Z8673 Personal history of transient ischemic attack (TIA), and cerebral infarction without residual deficits: Secondary | ICD-10-CM

## 2023-01-12 DIAGNOSIS — M40209 Unspecified kyphosis, site unspecified: Secondary | ICD-10-CM | POA: Diagnosis present

## 2023-01-12 DIAGNOSIS — Z79890 Hormone replacement therapy (postmenopausal): Secondary | ICD-10-CM

## 2023-01-12 DIAGNOSIS — F419 Anxiety disorder, unspecified: Secondary | ICD-10-CM | POA: Diagnosis present

## 2023-01-12 DIAGNOSIS — E039 Hypothyroidism, unspecified: Secondary | ICD-10-CM | POA: Diagnosis present

## 2023-01-12 DIAGNOSIS — I509 Heart failure, unspecified: Secondary | ICD-10-CM

## 2023-01-12 DIAGNOSIS — Z6826 Body mass index (BMI) 26.0-26.9, adult: Secondary | ICD-10-CM

## 2023-01-12 DIAGNOSIS — I5032 Chronic diastolic (congestive) heart failure: Secondary | ICD-10-CM | POA: Diagnosis present

## 2023-01-12 DIAGNOSIS — I10 Essential (primary) hypertension: Secondary | ICD-10-CM | POA: Diagnosis present

## 2023-01-12 DIAGNOSIS — Z9981 Dependence on supplemental oxygen: Secondary | ICD-10-CM

## 2023-01-12 DIAGNOSIS — D696 Thrombocytopenia, unspecified: Secondary | ICD-10-CM | POA: Diagnosis present

## 2023-01-12 DIAGNOSIS — Z882 Allergy status to sulfonamides status: Secondary | ICD-10-CM

## 2023-01-12 DIAGNOSIS — R0600 Dyspnea, unspecified: Principal | ICD-10-CM | POA: Diagnosis present

## 2023-01-12 DIAGNOSIS — Z1152 Encounter for screening for COVID-19: Secondary | ICD-10-CM

## 2023-01-12 DIAGNOSIS — K219 Gastro-esophageal reflux disease without esophagitis: Secondary | ICD-10-CM | POA: Diagnosis present

## 2023-01-12 DIAGNOSIS — Z7982 Long term (current) use of aspirin: Secondary | ICD-10-CM

## 2023-01-12 DIAGNOSIS — J9611 Chronic respiratory failure with hypoxia: Secondary | ICD-10-CM | POA: Diagnosis present

## 2023-01-12 DIAGNOSIS — I5A Non-ischemic myocardial injury (non-traumatic): Secondary | ICD-10-CM | POA: Diagnosis present

## 2023-01-12 DIAGNOSIS — Z955 Presence of coronary angioplasty implant and graft: Secondary | ICD-10-CM

## 2023-01-12 DIAGNOSIS — J9811 Atelectasis: Secondary | ICD-10-CM | POA: Diagnosis present

## 2023-01-12 DIAGNOSIS — Z8249 Family history of ischemic heart disease and other diseases of the circulatory system: Secondary | ICD-10-CM

## 2023-01-12 DIAGNOSIS — D649 Anemia, unspecified: Secondary | ICD-10-CM | POA: Diagnosis present

## 2023-01-12 DIAGNOSIS — J441 Chronic obstructive pulmonary disease with (acute) exacerbation: Secondary | ICD-10-CM | POA: Diagnosis present

## 2023-01-12 DIAGNOSIS — J439 Emphysema, unspecified: Secondary | ICD-10-CM | POA: Diagnosis present

## 2023-01-12 DIAGNOSIS — I700 Atherosclerosis of aorta: Secondary | ICD-10-CM | POA: Diagnosis present

## 2023-01-12 DIAGNOSIS — K909 Intestinal malabsorption, unspecified: Secondary | ICD-10-CM | POA: Diagnosis present

## 2023-01-12 DIAGNOSIS — R911 Solitary pulmonary nodule: Secondary | ICD-10-CM | POA: Diagnosis present

## 2023-01-12 DIAGNOSIS — R54 Age-related physical debility: Secondary | ICD-10-CM | POA: Diagnosis present

## 2023-01-12 DIAGNOSIS — I340 Nonrheumatic mitral (valve) insufficiency: Secondary | ICD-10-CM | POA: Diagnosis present

## 2023-01-12 DIAGNOSIS — R7989 Other specified abnormal findings of blood chemistry: Secondary | ICD-10-CM | POA: Diagnosis present

## 2023-01-12 DIAGNOSIS — I2489 Other forms of acute ischemic heart disease: Secondary | ICD-10-CM | POA: Diagnosis present

## 2023-01-12 DIAGNOSIS — Z951 Presence of aortocoronary bypass graft: Secondary | ICD-10-CM

## 2023-01-12 DIAGNOSIS — D509 Iron deficiency anemia, unspecified: Principal | ICD-10-CM | POA: Diagnosis present

## 2023-01-12 DIAGNOSIS — Z5320 Procedure and treatment not carried out because of patient's decision for unspecified reasons: Secondary | ICD-10-CM | POA: Diagnosis not present

## 2023-01-12 DIAGNOSIS — Z87891 Personal history of nicotine dependence: Secondary | ICD-10-CM

## 2023-01-12 DIAGNOSIS — Z888 Allergy status to other drugs, medicaments and biological substances status: Secondary | ICD-10-CM

## 2023-01-12 DIAGNOSIS — D72829 Elevated white blood cell count, unspecified: Secondary | ICD-10-CM | POA: Diagnosis present

## 2023-01-12 DIAGNOSIS — I110 Hypertensive heart disease with heart failure: Secondary | ICD-10-CM | POA: Diagnosis present

## 2023-01-12 DIAGNOSIS — I5031 Acute diastolic (congestive) heart failure: Secondary | ICD-10-CM | POA: Diagnosis present

## 2023-01-12 DIAGNOSIS — Z79899 Other long term (current) drug therapy: Secondary | ICD-10-CM

## 2023-01-12 DIAGNOSIS — I252 Old myocardial infarction: Secondary | ICD-10-CM

## 2023-01-12 DIAGNOSIS — J449 Chronic obstructive pulmonary disease, unspecified: Secondary | ICD-10-CM | POA: Diagnosis present

## 2023-01-12 DIAGNOSIS — Z8572 Personal history of non-Hodgkin lymphomas: Secondary | ICD-10-CM

## 2023-01-12 LAB — COMPREHENSIVE METABOLIC PANEL
ALT: 11 U/L (ref 0–44)
AST: 19 U/L (ref 15–41)
Albumin: 3.9 g/dL (ref 3.5–5.0)
Alkaline Phosphatase: 55 U/L (ref 38–126)
Anion gap: 9 (ref 5–15)
BUN: 20 mg/dL (ref 8–23)
CO2: 31 mmol/L (ref 22–32)
Calcium: 9.2 mg/dL (ref 8.9–10.3)
Chloride: 104 mmol/L (ref 98–111)
Creatinine, Ser: 0.88 mg/dL (ref 0.44–1.00)
GFR, Estimated: 60 mL/min — ABNORMAL LOW (ref >60)
Glucose, Bld: 120 mg/dL — ABNORMAL HIGH (ref 70–99)
Potassium: 3.5 mmol/L (ref 3.5–5.1)
Sodium: 144 mmol/L (ref 135–145)
Total Bilirubin: 0.5 mg/dL (ref 0.3–1.2)
Total Protein: 5.9 g/dL — ABNORMAL LOW (ref 6.5–8.1)

## 2023-01-12 LAB — CBC
HCT: 26.6 % — ABNORMAL LOW (ref 36.0–46.0)
Hemoglobin: 8.3 g/dL — ABNORMAL LOW (ref 12.0–15.0)
MCH: 28.2 pg (ref 26.0–34.0)
MCHC: 31.2 g/dL (ref 30.0–36.0)
MCV: 90.5 fL (ref 80.0–100.0)
Platelets: 168 10*3/uL (ref 150–400)
RBC: 2.94 MIL/uL — ABNORMAL LOW (ref 3.87–5.11)
RDW: 15.9 % — ABNORMAL HIGH (ref 11.5–15.5)
WBC: 19.4 10*3/uL — ABNORMAL HIGH (ref 4.0–10.5)
nRBC: 0 % (ref 0.0–0.2)

## 2023-01-12 LAB — RESP PANEL BY RT-PCR (RSV, FLU A&B, COVID)  RVPGX2
Influenza A by PCR: NEGATIVE
Influenza B by PCR: NEGATIVE
Resp Syncytial Virus by PCR: NEGATIVE
SARS Coronavirus 2 by RT PCR: NEGATIVE

## 2023-01-12 LAB — D-DIMER, QUANTITATIVE: D-Dimer, Quant: 1.64 ug/mL-FEU — ABNORMAL HIGH (ref 0.00–0.50)

## 2023-01-12 LAB — TROPONIN I (HIGH SENSITIVITY)
Troponin I (High Sensitivity): 55 ng/L — ABNORMAL HIGH (ref <18)
Troponin I (High Sensitivity): 45 ng/L — ABNORMAL HIGH (ref <18)

## 2023-01-12 LAB — BRAIN NATRIURETIC PEPTIDE: B Natriuretic Peptide: 1050.4 pg/mL — ABNORMAL HIGH (ref 0.0–100.0)

## 2023-01-12 MED ORDER — ROPINIROLE HCL 1 MG PO TABS: 1.0000 mg | ORAL_TABLET | ORAL | Status: DC

## 2023-01-12 MED ORDER — IPRATROPIUM-ALBUTEROL 0.5-2.5 (3) MG/3ML IN SOLN
3.0000 mL | RESPIRATORY_TRACT | Status: AC
  Administered 2023-01-12: 3 mL via RESPIRATORY_TRACT
  Filled 2023-01-12: qty 3

## 2023-01-12 MED ORDER — IOHEXOL 350 MG/ML SOLN
100.0000 mL | Freq: Once | INTRAVENOUS | Status: AC | PRN
  Administered 2023-01-12: 85 mL via INTRAVENOUS

## 2023-01-12 MED ORDER — ACETAMINOPHEN 325 MG PO TABS
650.0000 mg | ORAL_TABLET | Freq: Once | ORAL | Status: AC
  Administered 2023-01-12: 650 mg via ORAL
  Filled 2023-01-12: qty 2

## 2023-01-12 MED ORDER — FUROSEMIDE 10 MG/ML IJ SOLN
40.0000 mg | Freq: Once | INTRAMUSCULAR | Status: AC
  Administered 2023-01-12: 40 mg via INTRAVENOUS
  Filled 2023-01-12: qty 4

## 2023-01-12 NOTE — ED Notes (Signed)
RT Note: Patient has home oxygen at 2lpm.

## 2023-01-12 NOTE — ED Notes (Signed)
RT Note: Humidity added to patients oxygen.

## 2023-01-12 NOTE — ED Notes (Signed)
Hospitalist for [facility] paged for [facility] MD @ [phone]

## 2023-01-12 NOTE — ED Triage Notes (Signed)
Pt via EMS from [facility]. Nasal congestion, drainage, and orthopnea x 2 days.  Chronically on 2L [place].

## 2023-01-12 NOTE — ED Provider Notes (Signed)
[place] [facility]MENT AT [facility] Provider Note   CSN: [id number] Arrival date & time: 01/12/23  1637     History  Chief Complaint  Patient presents with   Nasal Congestion    Irie Willner is a 89 y.o. female.  89 year old female with a history of CAD status post CABG on aspirin, non-Hodgkin's lymphoma, CHF on 2 L nasal cannula, and stroke who presents to the emergency department with shortness of breath.  Reports that for the past few days she has been having congestion as well as shortness of breath.  Says that she is not coughing but has to hawk up phlegm.  Has been more fatigue but no fevers.  Denies any chest pain.  No significant leg swelling.  Currently is staying at [facility].  Says that her shortness of breath is making her anxious as well.       Home Medications Prior to Admission medications   Medication Sig Start Date End Date Taking? Authorizing Provider  amLODipine (NORVASC) 5 MG tablet Take 1 tablet (5 mg total) by mouth daily. 07/28/20   Rai, Ripudeep Kirtland Bouchard, MD  ARTIFICIAL TEAR OP Place 1 drop into both eyes as needed (dry eyes).    [provider]  aspirin 81 MG EC tablet Take 1 tablet (81 mg total) by mouth daily. Swallow whole. 07/28/20   Rai, Delene Ruffini, MD  atorvastatin (LIPITOR) 40 MG tablet Take 1 tablet (40 mg total) by mouth at bedtime. 07/27/20   Rai, Delene Ruffini, MD  Calcium Carb-Cholecalciferol (CALCIUM 600+D3 PO) Take 1 tablet by mouth at bedtime.    [provider]  Cholecalciferol (VITAMIN D3 SUPER STRENGTH) 50 MCG (2000 UT) CAPS Take 4,000 Units by mouth daily.     [provider]  Cranberry 425 MG CAPS Take 425 mg by mouth daily.     [provider]  docusate sodium (COLACE) 100 MG capsule Take 100 mg by mouth daily as needed for mild constipation.    [provider]  FERREX 150 150 MG capsule Take by mouth 2 (two) times daily. 02/04/22   [provider]   furosemide (LASIX) 20 MG tablet Take 1 tablet (20 mg total) by mouth daily. 07/28/20   Rai, Delene Ruffini, MD  hydrOXYzine (ATARAX) 50 MG tablet SMARTSIG:1 Tablet(s) By Mouth Every 12 Hours PRN 11/05/21   [provider]  ipratropium-albuterol (DUONEB) 0.5-2.5 (3) MG/3ML SOLN Take 3 mLs by nebulization every 12 (twelve) hours. 03/10/22   Martina Sinner, MD  levothyroxine (SYNTHROID) 25 MCG tablet Take 25 mcg by mouth daily before breakfast.    [provider]  lidocaine-prilocaine (EMLA) cream Apply 1 application topically See admin instructions. Apply to port-a-cath one hour prior to procedure 09/16/19   [provider]  MUCUS RELIEF 600 MG 12 hr tablet Take by mouth. 02/23/22   [provider]  Multiple Vitamin (DAILY VITE PO) Take 1 tablet by mouth daily.    [provider]  Multiple Vitamins-Minerals (PRESERVISION AREDS PO) Take 1 tablet by mouth in the morning and at bedtime.    [provider]  nystatin cream (MYCOSTATIN) Apply 1 application topically every 12 (twelve) hours as needed (to rash between the legs and groin).    [provider]  OXYGEN Inhale 2 L into the lungs continuous.    [provider]  psyllium (METAMUCIL) 58.6 % packet Take 1 packet by mouth every Monday, Wednesday, and Friday. ORANGE 11/29/19   Biby,  Jani Files, NP  rOPINIRole (REQUIP) 1 MG tablet Take 1 mg by mouth at bedtime.    [provider]  sertraline (ZOLOFT) 25 MG tablet Take 25 mg by mouth daily. 10/05/21   [provider]  timolol (TIMOPTIC) 0.5 % ophthalmic solution Place 1 drop into both eyes 2 (two) times daily.    [provider]  trimethoprim (TRIMPEX) 100 MG tablet Take 100 mg by mouth daily. 11/15/20   [provider]  umeclidinium bromide (INCRUSE ELLIPTA) 62.5 MCG/ACT AEPB Inhale 1 puff into the lungs daily. 08/09/21   Martina Sinner, MD      Allergies    Epipen [epinephrine hcl (nasal)], Levaquin  [levofloxacin in d5w], Beta adrenergic blockers, Chlorhexidine gluconate, Heparin, and Sulfa antibiotics    Review of Systems   Review of Systems  Physical Exam Updated Vital Signs BP (!) 110/57   Pulse (!) 59   Temp 98.2 F (36.8 C) (Oral)   Resp 20   SpO2 99%  Physical Exam Vitals and nursing note reviewed.  Constitutional:      General: She is not in acute distress.    Appearance: She is well-developed.     Comments: On 2 L nasal cannula speaking in short sentences  HENT:     Head: Normocephalic and atraumatic.     Right Ear: External ear normal.     Left Ear: External ear normal.     Nose: Nose normal.     Mouth/Throat:     Comments: Epistaxis that she reports started after COVID test today Eyes:     Extraocular Movements: Extraocular movements intact.     Conjunctiva/sclera: Conjunctivae normal.     Pupils: Pupils are equal, round, and reactive to light.  Cardiovascular:     Rate and Rhythm: Normal rate and regular rhythm.     Heart sounds: No murmur heard. Pulmonary:     Effort: Pulmonary effort is normal. No respiratory distress.     Breath sounds: Normal breath sounds.  Abdominal:     General: Abdomen is flat. There is no distension.     Palpations: Abdomen is soft. There is no mass.     Tenderness: There is no abdominal tenderness. There is no guarding.  Musculoskeletal:     Cervical back: Normal range of motion and neck supple.     Right lower leg: No edema.     Left lower leg: No edema.  Skin:    General: Skin is warm and dry.  Neurological:     Mental Status: She is alert and oriented to person, place, and time. Mental status is at baseline.  Psychiatric:        Mood and Affect: Mood normal.     ED Results / Procedures / Treatments   Labs (all labs ordered are listed, but only abnormal results are displayed) Labs Reviewed  COMPREHENSIVE METABOLIC PANEL - Abnormal; Notable for the following components:      Result Value   Glucose, Bld 120 (*)     Total Protein 5.9 (*)    GFR, Estimated 60 (*)    All other components within normal limits  CBC - Abnormal; Notable for the following components:   WBC 19.4 (*)    RBC 2.94 (*)    Hemoglobin 8.3 (*)    HCT 26.6 (*)    RDW 15.9 (*)    All other components within normal limits  BRAIN NATRIURETIC PEPTIDE - Abnormal; Notable for the following components:   B Natriuretic  Peptide 1,050.4 (*)    All other components within normal limits  D-DIMER, QUANTITATIVE - Abnormal; Notable for the following components:   D-Dimer, Quant 1.64 (*)    All other components within normal limits  TROPONIN I (HIGH SENSITIVITY) - Abnormal; Notable for the following components:   Troponin I (High Sensitivity) 55 (*)    All other components within normal limits  TROPONIN I (HIGH SENSITIVITY) - Abnormal; Notable for the following components:   Troponin I (High Sensitivity) 45 (*)    All other components within normal limits  RESP PANEL BY RT-PCR (RSV, FLU A&B, COVID)  [id number]    EKG EKG Interpretation Date/Time:  Thursday January 12 2023 17:31:25 EDT Ventricular Rate:  69 PR Interval:  138 QRS Duration:  83 QT Interval:  418 QTC Calculation: 448 R Axis:   12  Text Interpretation: Sinus rhythm Minimal ST depression, lateral leads No significant change since last tracing Confirmed by Vonita Moss [phone]) on 01/12/2023 7:02:18 PM  Radiology CT Angio Chest PE W and/or Wo Contrast  Result Date: 01/12/2023 CLINICAL DATA:  Shortness of breath and positive D-dimer EXAM: CT ANGIOGRAPHY CHEST WITH CONTRAST TECHNIQUE: Multidetector CT imaging of the chest was performed using the standard protocol during bolus administration of intravenous contrast. Multiplanar CT image reconstructions and MIPs were obtained to evaluate the vascular anatomy. RADIATION DOSE REDUCTION: This exam was performed according to the departmental dose-optimization program which includes automated exposure control, adjustment of the mA  and/or kV according to patient size and/or use of iterative reconstruction technique. CONTRAST:  85mL OMNIPAQUE IOHEXOL 350 MG/ML SOLN COMPARISON:  Chest x-ray from earlier in the same day. FINDINGS: Cardiovascular: Atherosclerotic calcifications of the thoracic aorta are noted without aneurysmal dilatation or dissection. No cardiac enlargement is noted. Coronary calcifications are seen. The pulmonary artery shows a normal branching pattern bilaterally. No intraluminal filling defect to suggest pulmonary embolism is noted. Mediastinum/Nodes: Thoracic inlet is within normal limits. No hilar or mediastinal adenopathy is noted. The esophagus as visualized is within normal limits. Lungs/Pleura: Diffuse emphysematous changes are noted. No focal confluent infiltrate or sizable effusion is seen. Stable left upper lobe pulmonary nodule is noted medially on image number 27 of series 6. A pleural based nodule is noted in the right lower lobe which measures 15 mm in mean diameter. This is new from the prior exam. No pneumothorax is noted. Upper Abdomen: Renal cysts are noted particularly on the left. A large fluid collection is noted arising in an exophytic nature from the upper pole of the left kidney. This measures at least 7.4 cm in greatest dimension but is stable from the prior exam. No follow-up is recommended. Musculoskeletal: Degenerative changes of the thoracic spine are seen. No acute rib abnormality is noted. Chronic T8 compression deformity is noted. Review of the MIP images confirms the above findings. IMPRESSION: No evidence of pulmonary emboli. Right solid pulmonary nodule measuring 15 mm. Per Fleischner Society Guidelines, consider a non-contrast Chest CT at 3 months, a PET/CT, or tissue sampling. These guidelines do not apply to immunocompromised patients and patients with cancer. Follow up in patients with significant comorbidities as clinically warranted. For lung cancer screening, adhere to Lung-RADS  guidelines. Reference: Radiology. 2017; 284(1):228-43. Stable left upper lobe pulmonary nodule from the prior exam. No follow-up is recommended. Aortic Atherosclerosis (ICD10-I70.0) and Emphysema (ICD10-J43.9). Electronically Signed   By: Alcide Clever M.D.   On: 01/12/2023 21:24   DG Chest Portable 1 View  Result Date: 01/12/2023 CLINICAL DATA:  Shortness of breath EXAM: PORTABLE CHEST 1 VIEW COMPARISON:  07/09/2021 FINDINGS: Cardiomegaly. Increased interstitial markings without frank interstitial edema. Mild bilateral lower lobe opacities, likely scarring/atelectasis. No definite pleural effusion.  No pneumothorax. Right chest port terminates at the cavoatrial junction. Median sternotomy. IMPRESSION: Cardiomegaly.  No frank interstitial edema. Mild bilateral lower lobe opacities, likely scarring/atelectasis. Electronically Signed   By: Charline Bills M.D.   On: 01/12/2023 19:02    Procedures Procedures    Medications Ordered in ED Medications  furosemide (LASIX) injection 40 mg (40 mg Intravenous Given 01/12/23 1917)  acetaminophen (TYLENOL) tablet 650 mg (650 mg Oral Given 01/12/23 1953)  iohexol (OMNIPAQUE) 350 MG/ML injection 100 mL (85 mLs Intravenous Contrast Given 01/12/23 2014)  ipratropium-albuterol (DUONEB) 0.5-2.5 (3) MG/3ML nebulizer solution 3 mL (3 mLs Nebulization Given 01/12/23 2217)  cefTRIAXone (ROCEPHIN) 1 g in sodium chloride 0.9 % 100 mL IVPB (0 g Intravenous Stopped 01/13/23 0827)  doxycycline (VIBRAMYCIN) 100 mg in sodium chloride 0.9 % 250 mL IVPB (100 mg Intravenous New Bag/Given 01/13/23 2952)    ED Course/ Medical Decision Making/ A&P Clinical Course as of 01/13/23 1038  Thu Jan 12, 2023  1823 Hemoglobin(!): 8.3 Baseline of 10 [RP]  1900 B Natriuretic Peptide(!): 1,050.4 Baseline 380 [RP]  1900 Troponin I (High Sensitivity)(!): 55 Prior was 70 [RP]  2137 CT Angio Chest PE W and/or Wo Contrast CT scan shows lung nodule only [RP]  2321 Dr Margo Aye [facility]  [RP]    Clinical Course User Index [RP] Rondel Baton, MD                                 Medical Decision Making Amount and/or Complexity of Data Reviewed Labs: ordered. Decision-making details documented in ED Course. Radiology: ordered. Decision-making details documented in ED Course.  Risk OTC drugs. Prescription drug management. Decision regarding hospitalization.   Jilene Carnevale is a 89 y.o. female with comorbidities that complicate the patient evaluation including CAD status post CABG on aspirin, non-Hodgkin's lymphoma, CHF on 2 L nasal cannula, and stroke who presents to the emergency department with shortness of breath.    Initial Ddx:  Pneumonia, URI, PE, heart failure exacerbation, COPD, pulmonary hypertension, anxiety  MDM/Course:  Patient presents to the emergency department with dyspnea.  Also reports walking up phlegm.  On exam does appear quite dyspneic speaking in very short phrases.  Is currently on her home oxygen requirement and does not have any wheezing or other abnormal breath sounds.  No significant lower extremity edema currently.  She had labs drawn that showed a leukocytosis of 19 but a chest x-ray and CTA that did not show evidence of pneumonia.  Her COVID and flu were negative.  Did have a BNP that is significantly elevated from prior at [facility] as well as a troponin that was elevated but downtrending.  Patient was given Lasix and a trial of DuoNeb but upon re-evaluation remains quite dyspneic.  Suspect that she may have either another URI causing her symptoms or perhaps pulmonary hypertension or shunt that has been undiagnosed.  Last echo was several years ago.  Performed shared decision making regarding patient disposition and given the fact that she remains quite dyspneic we will go ahead and admit her again for repeat echo and observation.  This patient presents to the ED for concern of complaints listed in HPI, this involves an extensive number of  treatment options, and  is a complaint that carries with it a high risk of complications and morbidity. Disposition including potential need for admission considered.   Dispo: Admit to Floor  Additional history obtained from family Records reviewed Outpatient Clinic Notes The following labs were independently interpreted: Chemistry and show no acute abnormality I independently reviewed the following imaging with scope of interpretation limited to determining acute life threatening conditions related to emergency care: Chest x-ray and agree with the radiologist interpretation with the following exceptions: none I personally reviewed and interpreted cardiac monitoring: normal sinus rhythm  I personally reviewed and interpreted the pt's EKG: see above for interpretation  I have reviewed the patients home medications and made adjustments as needed Consults: Hospitalist Social Determinants of health:  Elderly    Final Clinical Impression(s) / ED Diagnoses Final diagnoses:  Dyspnea, unspecified type  Chronic heart failure, unspecified heart failure type [facility])    Rx / DC Orders ED Discharge Orders     None         Rondel Baton, MD 01/13/23 1038

## 2023-01-13 DIAGNOSIS — E039 Hypothyroidism, unspecified: Secondary | ICD-10-CM | POA: Diagnosis present

## 2023-01-13 DIAGNOSIS — K909 Intestinal malabsorption, unspecified: Secondary | ICD-10-CM | POA: Diagnosis present

## 2023-01-13 DIAGNOSIS — F419 Anxiety disorder, unspecified: Secondary | ICD-10-CM | POA: Diagnosis present

## 2023-01-13 DIAGNOSIS — I5A Non-ischemic myocardial injury (non-traumatic): Secondary | ICD-10-CM | POA: Diagnosis present

## 2023-01-13 DIAGNOSIS — I2510 Atherosclerotic heart disease of native coronary artery without angina pectoris: Secondary | ICD-10-CM | POA: Diagnosis present

## 2023-01-13 DIAGNOSIS — Z87891 Personal history of nicotine dependence: Secondary | ICD-10-CM | POA: Diagnosis not present

## 2023-01-13 DIAGNOSIS — D72829 Elevated white blood cell count, unspecified: Secondary | ICD-10-CM | POA: Diagnosis present

## 2023-01-13 DIAGNOSIS — I5031 Acute diastolic (congestive) heart failure: Secondary | ICD-10-CM | POA: Diagnosis present

## 2023-01-13 DIAGNOSIS — I700 Atherosclerosis of aorta: Secondary | ICD-10-CM | POA: Diagnosis present

## 2023-01-13 DIAGNOSIS — I5032 Chronic diastolic (congestive) heart failure: Secondary | ICD-10-CM | POA: Diagnosis present

## 2023-01-13 DIAGNOSIS — R7989 Other specified abnormal findings of blood chemistry: Secondary | ICD-10-CM | POA: Diagnosis present

## 2023-01-13 DIAGNOSIS — Z79899 Other long term (current) drug therapy: Secondary | ICD-10-CM | POA: Diagnosis not present

## 2023-01-13 DIAGNOSIS — Z79890 Hormone replacement therapy (postmenopausal): Secondary | ICD-10-CM | POA: Diagnosis not present

## 2023-01-13 DIAGNOSIS — Z951 Presence of aortocoronary bypass graft: Secondary | ICD-10-CM | POA: Diagnosis not present

## 2023-01-13 DIAGNOSIS — R911 Solitary pulmonary nodule: Secondary | ICD-10-CM | POA: Diagnosis present

## 2023-01-13 DIAGNOSIS — Z9981 Dependence on supplemental oxygen: Secondary | ICD-10-CM | POA: Diagnosis not present

## 2023-01-13 DIAGNOSIS — I2489 Other forms of acute ischemic heart disease: Secondary | ICD-10-CM | POA: Diagnosis present

## 2023-01-13 DIAGNOSIS — J9620 Acute and chronic respiratory failure, unspecified whether with hypoxia or hypercapnia: Secondary | ICD-10-CM | POA: Diagnosis not present

## 2023-01-13 DIAGNOSIS — J9611 Chronic respiratory failure with hypoxia: Secondary | ICD-10-CM | POA: Diagnosis present

## 2023-01-13 DIAGNOSIS — Z8572 Personal history of non-Hodgkin lymphomas: Secondary | ICD-10-CM | POA: Diagnosis not present

## 2023-01-13 DIAGNOSIS — I340 Nonrheumatic mitral (valve) insufficiency: Secondary | ICD-10-CM | POA: Diagnosis present

## 2023-01-13 DIAGNOSIS — J439 Emphysema, unspecified: Secondary | ICD-10-CM | POA: Diagnosis present

## 2023-01-13 DIAGNOSIS — R0609 Other forms of dyspnea: Secondary | ICD-10-CM | POA: Diagnosis not present

## 2023-01-13 DIAGNOSIS — Z888 Allergy status to other drugs, medicaments and biological substances status: Secondary | ICD-10-CM | POA: Diagnosis not present

## 2023-01-13 DIAGNOSIS — D649 Anemia, unspecified: Secondary | ICD-10-CM | POA: Diagnosis present

## 2023-01-13 DIAGNOSIS — R0600 Dyspnea, unspecified: Secondary | ICD-10-CM | POA: Diagnosis present

## 2023-01-13 DIAGNOSIS — Z1152 Encounter for screening for COVID-19: Secondary | ICD-10-CM | POA: Diagnosis not present

## 2023-01-13 DIAGNOSIS — J9811 Atelectasis: Secondary | ICD-10-CM | POA: Diagnosis present

## 2023-01-13 DIAGNOSIS — I110 Hypertensive heart disease with heart failure: Secondary | ICD-10-CM | POA: Diagnosis present

## 2023-01-13 DIAGNOSIS — D696 Thrombocytopenia, unspecified: Secondary | ICD-10-CM | POA: Diagnosis present

## 2023-01-13 DIAGNOSIS — D509 Iron deficiency anemia, unspecified: Secondary | ICD-10-CM | POA: Diagnosis present

## 2023-01-13 LAB — URINALYSIS, ROUTINE W REFLEX MICROSCOPIC
Bilirubin Urine: NEGATIVE
Glucose, UA: NEGATIVE mg/dL
Hgb urine dipstick: NEGATIVE
Ketones, ur: NEGATIVE mg/dL
Nitrite: NEGATIVE
Protein, ur: 100 mg/dL — AB
Specific Gravity, Urine: 1.031 — ABNORMAL HIGH (ref 1.005–1.030)
pH: 6 (ref 5.0–8.0)

## 2023-01-13 LAB — CBC
HCT: 26.7 % — ABNORMAL LOW (ref 36.0–46.0)
Hemoglobin: 8.2 g/dL — ABNORMAL LOW (ref 12.0–15.0)
MCH: 28.5 pg (ref 26.0–34.0)
MCHC: 30.7 g/dL (ref 30.0–36.0)
MCV: 92.7 fL (ref 80.0–100.0)
Platelets: 159 10*3/uL (ref 150–400)
RBC: 2.88 MIL/uL — ABNORMAL LOW (ref 3.87–5.11)
RDW: 15.9 % — ABNORMAL HIGH (ref 11.5–15.5)
WBC: 10.8 10*3/uL — ABNORMAL HIGH (ref 4.0–10.5)
nRBC: 0 % (ref 0.0–0.2)

## 2023-01-13 LAB — IRON AND TIBC
Iron: 23 ug/dL — ABNORMAL LOW (ref 28–170)
Saturation Ratios: 7 % — ABNORMAL LOW (ref 10.4–31.8)
TIBC: 349 ug/dL (ref 250–450)
UIBC: 326 ug/dL

## 2023-01-13 LAB — FERRITIN: Ferritin: 25 ng/mL (ref 11–307)

## 2023-01-13 MED ORDER — AMLODIPINE BESYLATE 5 MG PO TABS
5.0000 mg | ORAL_TABLET | Freq: Every day | ORAL | Status: DC
  Administered 2023-01-14 – 2023-01-15 (×2): 5 mg via ORAL
  Filled 2023-01-13 (×2): qty 1

## 2023-01-13 MED ORDER — ATORVASTATIN CALCIUM 40 MG PO TABS
40.0000 mg | ORAL_TABLET | Freq: Every day | ORAL | Status: DC
  Administered 2023-01-14 – 2023-01-15 (×2): 40 mg via ORAL
  Filled 2023-01-13 (×2): qty 1

## 2023-01-13 MED ORDER — SERTRALINE HCL 50 MG PO TABS
25.0000 mg | ORAL_TABLET | Freq: Every day | ORAL | Status: DC
  Administered 2023-01-14 – 2023-01-16 (×3): 25 mg via ORAL
  Filled 2023-01-13 (×3): qty 1

## 2023-01-13 MED ORDER — SODIUM CHLORIDE 0.9% FLUSH
3.0000 mL | Freq: Two times a day (BID) | INTRAVENOUS | Status: DC
  Administered 2023-01-13: 3 mL via INTRAVENOUS

## 2023-01-13 MED ORDER — ROPINIROLE HCL 1 MG PO TABS: 1.0000 mg | ORAL_TABLET | Freq: Once | ORAL | Status: DC

## 2023-01-13 MED ORDER — ROPINIROLE HCL 1 MG PO TABS
1.0000 mg | ORAL_TABLET | Freq: Every day | ORAL | Status: DC
  Administered 2023-01-13 – 2023-01-15 (×3): 1 mg via ORAL
  Filled 2023-01-13 (×3): qty 1

## 2023-01-13 MED ORDER — DOCUSATE SODIUM 100 MG PO CAPS: 100.0000 mg | ORAL_CAPSULE | Freq: Every day | ORAL | Status: DC | PRN

## 2023-01-13 MED ORDER — IPRATROPIUM-ALBUTEROL 0.5-2.5 (3) MG/3ML IN SOLN: 3.0000 mL | Freq: Two times a day (BID) | RESPIRATORY_TRACT | Status: DC

## 2023-01-13 MED ORDER — ACETAMINOPHEN 650 MG RE SUPP: 650.0000 mg | Freq: Four times a day (QID) | RECTAL | Status: DC | PRN

## 2023-01-13 MED ORDER — PROSIGHT PO TABS
1.0000 | ORAL_TABLET | Freq: Every day | ORAL | Status: DC
  Administered 2023-01-14 – 2023-01-16 (×3): 1 via ORAL
  Filled 2023-01-13 (×3): qty 1

## 2023-01-13 MED ORDER — POLYSACCHARIDE IRON COMPLEX 150 MG PO CAPS
150.0000 mg | ORAL_CAPSULE | Freq: Two times a day (BID) | ORAL | Status: DC
  Administered 2023-01-13 – 2023-01-16 (×6): 150 mg via ORAL
  Filled 2023-01-13 (×6): qty 1

## 2023-01-13 MED ORDER — TIMOLOL MALEATE 0.5 % OP SOLN: 1.0000 [drp] | Freq: Two times a day (BID) | OPHTHALMIC | Status: DC

## 2023-01-13 MED ORDER — GUAIFENESIN ER 600 MG PO TB12
1200.0000 mg | ORAL_TABLET | Freq: Two times a day (BID) | ORAL | Status: DC
  Administered 2023-01-13 – 2023-01-16 (×6): 1200 mg via ORAL
  Filled 2023-01-13 (×6): qty 2

## 2023-01-13 MED ORDER — LEVOTHYROXINE SODIUM 25 MCG PO TABS
25.0000 ug | ORAL_TABLET | Freq: Every day | ORAL | Status: DC
  Administered 2023-01-14 – 2023-01-16 (×3): 25 ug via ORAL
  Filled 2023-01-13 (×3): qty 1

## 2023-01-13 MED ORDER — LIDOCAINE-PRILOCAINE 2.5-2.5 % EX CREA: 1.0000 | TOPICAL_CREAM | CUTANEOUS | Status: DC

## 2023-01-13 MED ORDER — SODIUM CHLORIDE 0.9 % IV SOLN: 250.0000 mL | INTRAVENOUS | Status: DC | PRN

## 2023-01-13 MED ORDER — BUDESONIDE 0.5 MG/2ML IN SUSP
2.0000 mg | Freq: Two times a day (BID) | RESPIRATORY_TRACT | Status: DC
  Administered 2023-01-13: 2 mg via RESPIRATORY_TRACT
  Administered 2023-01-14: 0.5 mg via RESPIRATORY_TRACT
  Administered 2023-01-14 – 2023-01-16 (×4): 2 mg via RESPIRATORY_TRACT
  Filled 2023-01-13 (×5): qty 8

## 2023-01-13 MED ORDER — UMECLIDINIUM BROMIDE 62.5 MCG/ACT IN AEPB
1.0000 | INHALATION_SPRAY | Freq: Every day | RESPIRATORY_TRACT | Status: DC
  Administered 2023-01-15: 1 via RESPIRATORY_TRACT
  Filled 2023-01-13 (×2): qty 7

## 2023-01-13 MED ORDER — IPRATROPIUM-ALBUTEROL 0.5-2.5 (3) MG/3ML IN SOLN
3.0000 mL | Freq: Two times a day (BID) | RESPIRATORY_TRACT | Status: DC
  Administered 2023-01-13 – 2023-01-14 (×3): 3 mL via RESPIRATORY_TRACT
  Filled 2023-01-13 (×2): qty 3

## 2023-01-13 MED ORDER — SODIUM CHLORIDE 0.9 % IV SOLN
1.0000 g | Freq: Once | INTRAVENOUS | Status: AC
  Administered 2023-01-13: 1 g via INTRAVENOUS
  Filled 2023-01-13: qty 10

## 2023-01-13 MED ORDER — PSYLLIUM 95 % PO PACK
1.0000 | PACK | Freq: Every day | ORAL | Status: DC
  Administered 2023-01-14 – 2023-01-16 (×3): 1 via ORAL
  Filled 2023-01-13 (×3): qty 1

## 2023-01-13 MED ORDER — ONDANSETRON HCL 4 MG/2ML IJ SOLN: 4.0000 mg | Freq: Four times a day (QID) | INTRAMUSCULAR | Status: DC | PRN

## 2023-01-13 MED ORDER — ASPIRIN 81 MG PO TBEC
81.0000 mg | DELAYED_RELEASE_TABLET | Freq: Every day | ORAL | Status: DC
  Administered 2023-01-14 – 2023-01-16 (×3): 81 mg via ORAL
  Filled 2023-01-13 (×3): qty 1

## 2023-01-13 MED ORDER — FUROSEMIDE 10 MG/ML IJ SOLN
40.0000 mg | Freq: Every day | INTRAMUSCULAR | Status: DC
  Administered 2023-01-13: 40 mg via INTRAVENOUS
  Filled 2023-01-13: qty 4

## 2023-01-13 MED ORDER — SODIUM CHLORIDE 0.9 % IV SOLN
100.0000 mg | Freq: Once | INTRAVENOUS | Status: AC
  Administered 2023-01-13: 100 mg via INTRAVENOUS
  Filled 2023-01-13: qty 100

## 2023-01-13 MED ORDER — ONDANSETRON HCL 4 MG PO TABS: 4.0000 mg | ORAL_TABLET | Freq: Four times a day (QID) | ORAL | Status: DC | PRN

## 2023-01-13 MED ORDER — ROPINIROLE HCL 1 MG PO TABS: 1.0000 mg | ORAL_TABLET | Freq: Every day | ORAL | Status: DC

## 2023-01-13 MED ORDER — FUROSEMIDE 10 MG/ML IJ SOLN: 40.0000 mg | Freq: Every day | INTRAMUSCULAR | Status: DC

## 2023-01-13 MED ORDER — VITAMIN D 25 MCG (1000 UNIT) PO TABS
4000.0000 [IU] | ORAL_TABLET | Freq: Every day | ORAL | Status: DC
  Administered 2023-01-14 – 2023-01-16 (×3): 4000 [IU] via ORAL
  Filled 2023-01-13 (×3): qty 4

## 2023-01-13 MED ORDER — HYDROXYZINE HCL 25 MG PO TABS: 50.0000 mg | ORAL_TABLET | Freq: Two times a day (BID) | ORAL | Status: DC | PRN

## 2023-01-13 MED ORDER — ACETAMINOPHEN 325 MG PO TABS
650.0000 mg | ORAL_TABLET | Freq: Four times a day (QID) | ORAL | Status: DC | PRN
  Administered 2023-01-14 – 2023-01-15 (×3): 650 mg via ORAL
  Filled 2023-01-13 (×3): qty 2

## 2023-01-13 MED ORDER — TRIMETHOPRIM 100 MG PO TABS
100.0000 mg | ORAL_TABLET | Freq: Every day | ORAL | Status: DC
  Administered 2023-01-14 – 2023-01-16 (×3): 100 mg via ORAL
  Filled 2023-01-13 (×3): qty 1

## 2023-01-13 MED ORDER — SODIUM CHLORIDE 0.9% FLUSH: 3.0000 mL | INTRAVENOUS | Status: DC | PRN

## 2023-01-13 NOTE — ED Notes (Signed)
Complete bed change done..pure wick replaced

## 2023-01-13 NOTE — Assessment & Plan Note (Signed)
Patient presents to the ED for evaluation of worsening dyspnea mostly with exertion Dyspnea appears to be multifactorial and related to acute diastolic dysfunction CHF, a component of COPD exacerbation and possible symptomatic anemia (patient has had a drop in her hemoglobin from 10.7 >> 8.0 in 4 months) Expect improvement in patient's symptoms once acute illnesses improve or resolve

## 2023-01-13 NOTE — Assessment & Plan Note (Signed)
Continue aspirin and atorvastatin. ?

## 2023-01-13 NOTE — Assessment & Plan Note (Signed)
Most likely secondary to demand ischemia from acute CHF and COPD exacerbation

## 2023-01-13 NOTE — Assessment & Plan Note (Signed)
Continue sertraline

## 2023-01-13 NOTE — Assessment & Plan Note (Signed)
Continue Synthroid °

## 2023-01-13 NOTE — Assessment & Plan Note (Signed)
Patient with a known history of COPD and chronic respiratory failure Presents for evaluation of worsening shortness of breath and increased mucus production which is clear. Continue scheduled and as needed bronchodilator therapy as well as inhaled steroids

## 2023-01-13 NOTE — H&P (Addendum)
History and Physical    Patient: Makayla Fisher ZOX:[id number] DOB: 06-13-1922 DOA: 01/12/2023 DOS: the patient was seen and examined on 01/13/2023 PCP: Sunday Spillers, NP  Patient coming from: ALF/ILF  Chief Complaint:  Chief Complaint  Patient presents with   Nasal Congestion   HPI: Makayla Fisher is a 89 y.o. female with medical history significant for COPD with chronic hypoxic respiratory failure on 2 L of oxygen continuous, history of colostomy, hypertension, GERD who presents to the emergency room for evaluation of worsening shortness of breath from her baseline mostly with exertion.  Patient notes that her symptoms have progressively worsened over the last several months and she was noted to have conversational dyspnea during my examination.  She also has orthopnea. She denies having any cough, no fever, no leg swelling, no chest pain, no palpitations, no diaphoresis, no dizziness, no lightheadedness, no headache, no blurred vision,no focal deficit. Abnormal labs include a white count of 19.4, hemoglobin of 8.3 down from 10.7, 4 months ago, D-dimer 1.64, troponin 55 >> 45, BNP 1050 Chest x-ray reviewed by me shows cardiomegaly.  No frank interstitial edema. Mild bilateral lower lobe opacities, likely scarring/atelectasis. CT angiogram of the chest shows no evidence of pulmonary emboli. Right solid pulmonary nodule measuring 15 mm.  Stable left upper lobe pulmonary nodule from the prior exam. No follow-up is recommended. Aortic Atherosclerosis and Emphysema. Twelve-lead EKG reviewed by me shows sinus rhythm, minimal ST depression in the lateral leads. Patient received a dose of Rocephin and doxycycline in the ED as well as Lasix 40 mg IV She will be admitted to the hospital for further evaluation.   Review of Systems: As mentioned in the history of present illness. All other systems reviewed and are negative. 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   Stroke-like symptoms s/p IV tPA 11/28/2019   TIA (transient ischemic attack)    Past Surgical History:  Procedure Laterality Date   c section  1947, 1963   CARDIAC SURGERY  2009   CHOLECYSTECTOMY  1973   COLON SURGERY  2014   Colostomy   COLOSTOMY REVISION N/A 06/12/2014   Procedure: COLOSTOMY REVISION;  Surgeon: Romie Levee, MD;  Location: [facility];  Service: General;  Laterality: N/A;   CORONARY ANGIOPLASTY WITH STENT PLACEMENT  2009   CORONARY ARTERY BYPASS GRAFT  aug 2009    x3   ESOPHAGOGASTRODUODENOSCOPY  2013   EYE SURGERY Bilateral  10 years ago   lens replacments cataracts   right chest pac  june 2015   Social History:  reports that she quit smoking about 59 years ago. Her smoking use included cigarettes. She started smoking about 74 years ago. She has a 25 pack-year smoking history. She has never used smokeless tobacco. She reports current drug use. Drug: PCP. She reports that she does not drink alcohol.  Allergies  Allergen Reactions   Epipen [Epinephrine Hcl (  Nasal)] Palpitations   Levaquin [Levofloxacin In D5w] Other (See Comments)    Weakness- "Allergic," per MAR   Beta Adrenergic Blockers Other (See Comments)    intolerant to higher doses of BB due to bradycardia.   Chlorhexidine Gluconate Other (See Comments)    Pt declined use and suspects she may have an intolerance   Heparin Other (See Comments)    "Allergic," pre MAR- Maybe  affected platelets, per patient Pt has tolerated heparin flush many times since 2015   Sulfa Antibiotics Rash    Family History  Problem Relation Age of Onset   Diverticulitis Mother    Heart attack Mother    Ulcers Father     Prior to Admission medications   Medication Sig Start Date End Date Taking? Authorizing Provider  amLODipine (NORVASC) 5 MG tablet Take 1 tablet (5 mg total) by mouth daily. 07/28/20   Rai, Ripudeep Kirtland Bouchard, MD  ARTIFICIAL TEAR OP Place 1 drop into both eyes as needed (dry eyes).    [provider]  aspirin 81 MG EC tablet Take 1 tablet (81 mg total) by mouth daily. Swallow whole. 07/28/20   Rai, Delene Ruffini, MD  atorvastatin (LIPITOR) 40 MG tablet Take 1 tablet (40 mg total) by mouth at bedtime. 07/27/20   Rai, Delene Ruffini, MD  Calcium Carb-Cholecalciferol (CALCIUM 600+D3 PO) Take 1 tablet by mouth at bedtime.    [provider]  Cholecalciferol (VITAMIN D3 SUPER STRENGTH) 50 MCG (2000 UT) CAPS Take 4,000 Units by mouth daily.     [provider]  Cranberry 425 MG CAPS Take 425 mg by mouth daily.     [provider]  docusate sodium (COLACE) 100 MG capsule Take 100 mg by mouth daily as needed for mild constipation.    [provider]  FERREX 150 150 MG capsule Take by mouth 2 (two) times daily. 02/04/22   [provider]  furosemide (LASIX) 20 MG tablet Take 1 tablet (20 mg total) by mouth daily. 07/28/20   Rai, Delene Ruffini, MD  hydrOXYzine (ATARAX) 50 MG tablet SMARTSIG:1 Tablet(s) By Mouth Every 12 Hours PRN 11/05/21   [provider]  ipratropium-albuterol (DUONEB) 0.5-2.5 (3) MG/3ML SOLN Take 3 mLs by nebulization every 12 (twelve) hours. 03/10/22   Martina Sinner, MD  levothyroxine (SYNTHROID) 25 MCG tablet Take 25 mcg by mouth daily before breakfast.    [provider]  lidocaine-prilocaine (EMLA) cream Apply 1 application topically See admin instructions. Apply to port-a-cath one hour prior to  procedure 09/16/19   [provider]  MUCUS RELIEF 600 MG 12 hr tablet Take by mouth. 02/23/22   [provider]  Multiple Vitamin (DAILY VITE PO) Take 1 tablet by mouth daily.    [provider]  Multiple Vitamins-Minerals (PRESERVISION AREDS PO) Take 1 tablet by mouth in the morning and at bedtime.    [provider]  nystatin cream (MYCOSTATIN) Apply 1 application topically every 12 (twelve) hours as needed (to rash between the legs and groin).    [provider]  OXYGEN Inhale 2 L into the lungs continuous.    [provider]  psyllium (METAMUCIL) 58.6 % packet Take 1 packet by mouth every Monday, Wednesday, and Friday. ORANGE 11/29/19   Layne Benton, NP  rOPINIRole (REQUIP) 1 MG tablet Take 1 mg by mouth at bedtime.    [provider]  sertraline (ZOLOFT) 25 MG tablet Take 25 mg by mouth daily. 10/05/21   [provider]  timolol (TIMOPTIC) 0.5 % ophthalmic solution Place 1 drop into both eyes 2 (two) times daily.    [provider]  trimethoprim (TRIMPEX) 100 MG tablet Take 100 mg by mouth daily. 11/15/20   [provider]  umeclidinium bromide (INCRUSE ELLIPTA) 62.5 MCG/ACT AEPB Inhale 1 puff into the lungs daily. 08/09/21   Martina Sinner, MD    Physical Exam: Vitals:   01/13/23 1030 01/13/23 1200 01/13/23 1331 01/13/23 1439  BP: (!) 131/53  (!) 183/66 (!) 173/62  Pulse: (!) 59  69 69  Resp: 20  18 (!) 25  Temp:  97.8 F (36.6 C) 98.3 F (36.8 C)   TempSrc:  Oral Oral   SpO2: 100%  100% 96%   Physical Exam Vitals and nursing note reviewed.  Constitutional:      Appearance: Normal appearance.     Comments: Has conversational dyspnea  HENT:     Head: Normocephalic.     Nose: Nose normal.     Mouth/Throat:     Mouth: Mucous membranes are moist.  Eyes:     Conjunctiva/sclera: Conjunctivae normal.  Cardiovascular:     Rate and Rhythm: Regular rhythm.  Pulmonary:     Comments: Has  conversational dyspnea.  Scattered expiratory wheezes Abdominal:     General: Abdomen is flat. Bowel sounds are normal.     Palpations: Abdomen is soft.     Comments: Colostomy in place  Musculoskeletal:        General: Normal range of motion.     Cervical back: Normal range of motion and neck supple.  Skin:    General: Skin is warm and dry.  Neurological:     Mental Status: She is alert and oriented to person, place, and time.  Psychiatric:        Mood and Affect: Mood normal.        Behavior: Behavior normal.     Data Reviewed: Relevant notes from primary care and specialist visits, past discharge summaries as available in EHR, including Care Everywhere. Prior diagnostic testing as pertinent to current admission diagnoses Updated medications and problem lists for reconciliation ED course, including vitals, labs, imaging, treatment and response to treatment Triage notes, nursing and pharmacy notes and ED provider's notes Notable results as noted in HPI Labs reviewed.  Troponin 55 >> 45, BNP 1050, sodium 144, potassium 3.5, chloride 104, bicarb 31, glucose 120, BUN 20, creatinine 0.88, calcium 9.2, total protein 5.9, albumin 3.9, AST 19, ALT 11, alkaline phosphatase 55, total bilirubin 0.5, D-dimer 1.64, white count 19.4, hemoglobin 8.3, hematocrit 26.6, platelet count 168 There are no new results to review at this time.  Assessment and Plan: * Dyspnea Patient presents to the ED for evaluation of worsening dyspnea mostly with exertion Dyspnea appears to be multifactorial and related to acute diastolic dysfunction CHF, a component of COPD exacerbation and possible symptomatic anemia (patient has had a drop in her hemoglobin from 10.7 >> 8.0 in 4 months) Expect improvement in patient's symptoms once acute illnesses improve or resolve  Acute diastolic CHF (congestive heart failure) (HCC) Patient presents for evaluation of worsening shortness of breath with exertion and orthopnea Last  2D echocardiogram shows from 03/24 shows an LVEF of 50 to 55% with left ventricular regional wall motion abnormalities and grade 2 diastolic dysfunction Optimize blood pressure control Place patient on Lasix 40 mg IV daily Repeat 2D echocardiogram to assess LVEF as well as pulmonary artery pressure We will consult cardiology  Anemia Patient noted to  have a drop in her H&H from 10.7 to 8.0 No obvious source of blood loss Obtain iron and ferritin levels  COPD exacerbation (HCC) Patient with a known history of COPD and chronic respiratory failure Presents for evaluation of worsening shortness of breath and increased mucus production which is clear. Continue scheduled and as needed bronchodilator therapy as well as inhaled steroids  Chronic respiratory failure with hypoxia, on home O2 therapy (HCC) - 2 L/min continuous Patient has a history of COPD with chronic hypoxic respiratory failure on 2 L of oxygen Continue oxygen supplementation to maintain pulse oximetry greater than 92%  Hypertension Continue amlodipine  Elevated troponin Most likely secondary to demand ischemia from acute CHF and COPD exacerbation  Pulmonary nodule Patient noted to have a right solid pulmonary nodule measuring 15 mm which appears to be new as well as a stable left pulmonary nodule. Recommend repeat CT scan in 3 months  Leukocytosis Unclear etiology No obvious signs of an infectious process at this time Will obtain UA  CAD (coronary artery disease) Continue aspirin and atorvastatin  Anxiety disorder Continue sertraline  Hypothyroidism Continue Synthroid      Advance Care Planning:   Code Status: Full Code   Consults: Cardiology  Family Communication: Plan of care discussed with patient and her niece at the bedside.  All questions and concerns have been addressed.  They verbalized understanding and agree with the plan.  She lists her son-in-law, Julieanne Cotton as her healthcare power of  attorney.  Severity of Illness: The appropriate patient status for this patient is INPATIENT. Inpatient status is judged to be reasonable and necessary in order to provide the required intensity of service to ensure the patient's safety. The patient's presenting symptoms, physical exam findings, and initial radiographic and laboratory data in the context of their chronic comorbidities is felt to place them at high risk for further clinical deterioration. Furthermore, it is not anticipated that the patient will be medically stable for discharge from the hospital within 2 midnights of admission.   * I certify that at the point of admission it is my clinical judgment that the patient will require inpatient hospital care spanning beyond 2 midnights from the point of admission due to high intensity of service, high risk for further deterioration and high frequency of surveillance required.*  Author: Lucile Shutters, MD 01/13/2023 3:31 PM  For on call review [email or web address].

## 2023-01-13 NOTE — ED Notes (Signed)
Makayla Fisher at [facility] will send transport for Bed Ready at [facility] 3E [id number](NS)

## 2023-01-13 NOTE — Assessment & Plan Note (Addendum)
Unclear etiology No obvious signs of an infectious process at this time Will obtain UA

## 2023-01-13 NOTE — ED Provider Notes (Signed)
Patient is here boarding and awaiting a hospital bed.  I reviewed her workup from yesterday.  WBC elevation.  Ddimer elevation.  CT PE with no acute PE but atelectasis.  Concern for CHF - patient given IV diuresis.  Difficult to exclude infection, we'll start her on antibiotics at this time with her leukocytosis and hypoxia, given her age and comorbodities, as coverage for potential PNA.  She does have a hx of nonhodgkin's lymphoma noted  Will ask RN staff to reassess BP and med reconciliation this morning.  Diastolic pressure of 14 documented overnight, suspect this was an error.   Terald Sleeper, MD 01/13/23 [phone]

## 2023-01-13 NOTE — Assessment & Plan Note (Signed)
Prior hematology notes describe this as "malabsorption" iron deficiency.  Iron studies worse from May.  No recent Feraheme.  Family report more than average epistaxis but no other clinical bleeding/melena reported.  Not a candidate for colonoscopy or EGD, not a candidate for surgery or systemic therapy if cancer found, so do not recommend work up of iron deficiency. - Continue oral iron - Discussed blood transfusion here, which patient declined - Close follow up with hematology for iron infusion - Not a candidate for evaluation of iron deficiency

## 2023-01-13 NOTE — Assessment & Plan Note (Signed)
Patient noted to have a right solid pulmonary nodule measuring 15 mm which appears to be new as well as a stable left pulmonary nodule. Recommend repeat CT scan in 3 months

## 2023-01-13 NOTE — Assessment & Plan Note (Signed)
Patient has a history of COPD with chronic hypoxic respiratory failure on 2 L of oxygen Continue oxygen supplementation to maintain pulse oximetry greater than 92%

## 2023-01-13 NOTE — Assessment & Plan Note (Signed)
Patient presents for evaluation of worsening shortness of breath with exertion and orthopnea Last 2D echocardiogram shows from 03/24 shows an LVEF of 50 to 55% with left ventricular regional wall motion abnormalities and grade 2 diastolic dysfunction Optimize blood pressure control Place patient on Lasix 40 mg IV daily Repeat 2D echocardiogram to assess LVEF as well as pulmonary artery pressure We will consult cardiology

## 2023-01-13 NOTE — Consult Note (Addendum)
Cardiology Consultation   Patient ID: Makayla Fisher MRN: [id number]; DOB: 04-23-1923  Admit date: 01/12/2023 Date of Consult: 01/13/2023  PCP:  Sunday Spillers, NP   [facility] Providers Cardiologist:  Charlton Haws, MD        Patient Profile:   Makayla Fisher is a 89 y.o. female with a hx of follicular large cell lymphoma of abdomen, history of colostomy revision for stricture, history of relative bradycardia, CAD s/p CABG 2009 at [facility] in [facility], mitral regurgitation, tobacco abuse, emphysema, chronic thrombocytopenia, chronic diastolic heart failure and HOH who is being seen 01/13/2023 for the evaluation of possible CHF at the request of Dr. Joylene Igo.  History of Present Illness:   Makayla Fisher is a 89 year old female with past medical history of follicular large cell lymphoma of abdomen, history of colostomy revision for stricture, history of relative bradycardia, CAD s/p CABG 2009 at [facility] in [facility], mitral regurgitation, tobacco abuse, emphysema, chronic thrombocytopenia, chronic diastolic heart failure and HOH.  Makayla Fisher has memory issues on statin therapy.  Makayla Fisher has been followed by Dr. Myna Hidalgo of oncology service and has completed Rituxan in June 2018 and received iron infusion for anemia related malabsorption.  Patient was admitted to the hospital in May 2022 due to acute hypoxic respiratory failure.  CT at the time showed diffuse emphysematous changes with mild fibrotic changes.  Echocardiogram obtained on 07/23/2020 demonstrated EF 50 to 55%, grade 2 DD, mild hypokinesis of the LV mid apical inferior wall, inferoseptal wall and anteroseptal wall, normal RV, severe LAE, mild to moderate MR.  Troponin was around 2000 during the hospitalization.  Patient was ultimately discharged on aspirin and Plavix, Plavix was later stopped due to low platelet count.  Patient was last seen by Dr. Eden Emms in Feb 2022.  Makayla Fisher currently resides in  [facility] assisted living facility.  Makayla Fisher is on 24/7 2 L oxygen.  For the past several months, Makayla Fisher has noticed progressive dyspnea on exertion.  Symptom initially only occurred with physical activity, however eventually progressed to the point where Makayla Fisher would get short of breath with the eating.  Makayla Fisher occasionally cough up thick phlegm but denies any fever or chill.  Makayla Fisher denies any lower extremity edema.  Patient eventually sought medical attention at [facility], ED in the afternoon of 01/12/2023.  On arrival, blood pressure was elevated in the 140s to 160s.  O2 saturation 96% on nasal cannula 2 L.  Blood work showed stable renal function and electrolyte, elevated BNP of 1050.  Serial troponin 55-->45.  White blood cell count on arrival was 19.4.  Hemoglobin 8.3, baseline hemoglobin around 10.5.  D-dimer elevated 1.64.  Viral panel negative for COVID, RSV and influenza.  CTA of the chest was negative for PE, diffuse emphysematous changes, 15 mm right solid pulmonary nodule, repeat CT in 3 months was recommended, stable left upper lobe lung nodule when compared to previous exam.  Chest x-ray showed increased interstitial markings without frank interstitial edema, mild bilateral lower lobe opacities likely scarring/atelectasis.  Given the elevated elevated BNP, patient was given 40 mg IV Lasix yesterday.  Cardiology service consulted for acute on chronic diastolic heart failure.  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   Stroke-like symptoms s/p IV tPA 11/28/2019   TIA (transient ischemic attack)     Past Surgical History:  Procedure Laterality Date   c section  1947, 1963   CARDIAC SURGERY  2009   CHOLECYSTECTOMY  1973   COLON SURGERY  2014   Colostomy   COLOSTOMY REVISION N/A 06/12/2014   Procedure: COLOSTOMY REVISION;  Surgeon: Romie Levee, MD;  Location: [facility] ORS;  Service: General;  Laterality: N/A;   CORONARY ANGIOPLASTY WITH STENT PLACEMENT  2009   CORONARY ARTERY BYPASS GRAFT  aug 2009    x3   ESOPHAGOGASTRODUODENOSCOPY  2013   EYE SURGERY Bilateral  10 years ago   lens replacments cataracts   right chest pac  june 2015     Home Medications:  Prior to Admission medications   Medication Sig Start Date End Date Taking? Authorizing Provider  amLODipine (NORVASC) 5 MG tablet Take 1 tablet (5 mg total) by mouth daily. 07/28/20   Rai, Ripudeep Kirtland Bouchard, MD  ARTIFICIAL TEAR OP Place 1 drop into both eyes as needed (dry eyes).    [provider]  aspirin 81 MG EC tablet Take 1 tablet (81 mg total) by mouth daily. Swallow whole. 07/28/20   Rai, Delene Ruffini, MD  atorvastatin (LIPITOR) 40 MG tablet Take 1 tablet (40 mg total) by mouth at bedtime. 07/27/20   Rai, Delene Ruffini, MD  Calcium Carb-Cholecalciferol (CALCIUM 600+D3 PO) Take 1 tablet by mouth at bedtime.    [provider]  Cholecalciferol (VITAMIN D3 SUPER STRENGTH) 50 MCG (2000 UT) CAPS Take 4,000 Units by mouth daily.     [provider]  Cranberry 425 MG CAPS Take 425 mg by mouth daily.     [provider]  docusate sodium (COLACE) 100 MG capsule Take 100 mg by mouth daily as needed for mild constipation.    [provider]  FERREX 150 150 MG capsule Take by mouth 2 (two) times daily. 02/04/22   [provider]  furosemide (LASIX) 20 MG tablet Take 1 tablet (20 mg total) by mouth daily. 07/28/20   Rai, Delene Ruffini, MD  hydrOXYzine (ATARAX)  50 MG tablet SMARTSIG:1 Tablet(s) By Mouth Every 12 Hours PRN 11/05/21   [provider]  ipratropium-albuterol (DUONEB) 0.5-2.5 (3) MG/3ML SOLN Take 3 mLs by nebulization every 12 (twelve) hours. 03/10/22   Martina Sinner, MD  levothyroxine (SYNTHROID) 25 MCG tablet Take 25 mcg by mouth daily before breakfast.    [provider]  lidocaine-prilocaine (EMLA) cream Apply 1 application topically See admin instructions. Apply to port-a-cath one hour prior to procedure 09/16/19   [provider]  MUCUS RELIEF 600 MG 12 hr tablet Take by mouth. 02/23/22   [provider]  Multiple Vitamin (DAILY VITE PO) Take 1 tablet by mouth daily.    [provider]  Multiple Vitamins-Minerals (PRESERVISION AREDS PO) Take 1 tablet by mouth in the morning and at bedtime.    [provider]  nystatin cream (MYCOSTATIN) Apply 1 application topically every 12 (twelve) hours as needed (to rash between the legs and groin).    [provider]  OXYGEN Inhale 2 L  into the lungs continuous.    [provider]  psyllium (METAMUCIL) 58.6 % packet Take 1 packet by mouth every Monday, Wednesday, and Friday. ORANGE 11/29/19   Layne Benton, NP  rOPINIRole (REQUIP) 1 MG tablet Take 1 mg by mouth at bedtime.    [provider]  sertraline (ZOLOFT) 25 MG tablet Take 25 mg by mouth daily. 10/05/21   [provider]  timolol (TIMOPTIC) 0.5 % ophthalmic solution Place 1 drop into both eyes 2 (two) times daily.    [provider]  trimethoprim (TRIMPEX) 100 MG tablet Take 100 mg by mouth daily. 11/15/20   [provider]  umeclidinium bromide (INCRUSE ELLIPTA) 62.5 MCG/ACT AEPB Inhale 1 puff into the lungs daily. 08/09/21   Martina Sinner, MD    Inpatient Medications: Scheduled Meds:  amLODipine  5 mg Oral Daily   aspirin EC  81 mg Oral Daily   atorvastatin  40 mg Oral QHS   budesonide (PULMICORT) nebulizer solution  2 mg  Nebulization Q12H   Cholecalciferol  4,000 Units Oral Daily   furosemide  40 mg Intravenous Daily   guaiFENesin  1,200 mg Oral BID   ipratropium-albuterol  3 mL Nebulization Q12H   iron polysaccharides  150 mg Oral BID   [START ON 01/14/2023] levothyroxine  25 mcg Oral QAC breakfast   lidocaine-prilocaine  1 Application Topical See admin instructions   PreserVision AREDS  1 capsule Oral Daily   psyllium  1 packet Oral Daily   [START ON 01/14/2023] rOPINIRole  1 mg Oral QHS   rOPINIRole  1 mg Oral Once   sertraline  25 mg Oral Daily   sodium chloride flush  3 mL Intravenous Q12H   trimethoprim  100 mg Oral Daily   umeclidinium bromide  1 puff Inhalation Daily   Continuous Infusions:  sodium chloride     PRN Meds: sodium chloride, acetaminophen **OR** acetaminophen, docusate sodium, hydrOXYzine, ondansetron **OR** ondansetron (ZOFRAN) IV, sodium chloride flush  Allergies:    Allergies  Allergen Reactions   Epipen [Epinephrine Hcl (Nasal)] Palpitations   Levaquin [Levofloxacin In D5w] Other (See Comments)    Weakness- "Allergic," per MAR   Beta Adrenergic Blockers Other (See Comments)    intolerant to higher doses of BB due to bradycardia.   Chlorhexidine Gluconate Other (See Comments)    Pt declined use and suspects Makayla Fisher may have an intolerance   Heparin Other (See Comments)    "Allergic," pre MAR- Maybe affected platelets, per patient Pt has tolerated heparin flush many times since 2015   Sulfa Antibiotics Rash    Social History:   Social History   Socioeconomic History   Marital status: Widowed    Spouse name: Not on file   Number of children: Not on file   Years of education: Not on file   Highest education level: Not on file  Occupational History   Not on file  Tobacco Use   Smoking status: Former    Current packs/day: 0.00    Average packs/day: 1 pack/day for 25.0 years (25.0 ttl pk-yrs)    Types: Cigarettes    Start date: 05/16/1948    Quit date: 05/17/1963     Years since quitting: 59.7   Smokeless tobacco: Never   Tobacco comments:    quit smoking 50 years ago  Vaping Use   Vaping status: Never Used  Substance and Sexual Activity   Alcohol use: No    Alcohol/week: 0.0 standard drinks of alcohol  Drug use: Yes    Types: PCP   Sexual activity: Not Currently  Other Topics Concern   Not on file  Social History Narrative   Not on file   Social Determinants of Health   Financial Resource Strain: Not on file  Food Insecurity: Not on file  Transportation Needs: Not on file  Physical Activity: Not on file  Stress: Not on file  Social Connections: Not on file  Intimate Partner Violence: Not on file    Family History:    Family History  Problem Relation Age of Onset   Diverticulitis Mother    Heart attack Mother    Ulcers Father      ROS:  Please see the history of present illness.   All other ROS reviewed and negative.     Physical Exam/Data:   Vitals:   01/13/23 1030 01/13/23 1200 01/13/23 1331 01/13/23 1439  BP: (!) 131/53  (!) 183/66 (!) 173/62  Pulse: (!) 59  69 69  Resp: 20  18 (!) 25  Temp:  97.8 F (36.6 C) 98.3 F (36.8 C)   TempSrc:  Oral Oral   SpO2: 100%  100% 96%    Intake/Output Summary (Last 24 hours) at 01/13/2023 1613 Last data filed at 01/13/2023 1509 Gross per 24 hour  Intake 344.6 ml  Output 650 ml  Net -305.4 ml      08/29/2022    1:51 PM 03/10/2022    2:45 PM 02/24/2022    4:11 PM  Last 3 Weights  Weight (lbs) 148 lb 145 lb 148 lb  Weight (kg) 67.132 kg 65.772 kg 67.132 kg     There is no height or weight on file to calculate BMI.  General:  Well nourished, well developed, in no acute distress HEENT: normal Neck: no JVD Vascular: No carotid bruits; Distal pulses 2+ bilaterally Cardiac:  normal S1, S2; RRR. 3/6 murmur  Lungs: Diffuse bibasilar crackles Abd: soft, nontender, no hepatomegaly  Ext: no edema Musculoskeletal:  No deformities, BUE and BLE strength normal and equal Skin: warm  and dry  Neuro:  CNs 2-12 intact, no focal abnormalities noted Psych:  Normal affect   EKG:  The EKG was personally reviewed and demonstrates: Normal sinus rhythm, no significant ST-T wave changes Telemetry:  Telemetry was personally reviewed and demonstrates: Normal sinus rhythm, heart rate 60 to 70s  Relevant CV Studies:  Echo 07/23/2020  1. Left ventricular ejection fraction, by estimation, is 50 to 55%. The  left ventricle has low normal function. The left ventricle demonstrates  regional wall motion abnormalities (see scoring diagram/findings for  description). Left ventricular diastolic   parameters are consistent with Grade II diastolic dysfunction  (pseudonormalization). Elevated left ventricular end-diastolic pressure.  There is mild hypokinesis of the left ventricular, mid-apical inferior  wall, inferoseptal wall and anteroseptal wall.   2. Right ventricular systolic function is normal. The right ventricular  size is normal. There is mildly elevated pulmonary artery systolic  pressure.   3. Left atrial size was severely dilated.   4. Right atrial size was mild to moderately dilated.   5. The mitral valve is degenerative. Mild to moderate mitral valve  regurgitation. Moderate mitral annular calcification.   6. The aortic valve is grossly normal. There is mild calcification of the  aortic valve. There is mild thickening of the aortic valve. Aortic valve  regurgitation is not visualized. Mild aortic valve sclerosis is present,  with no evidence of aortic valve  stenosis.   7.  The inferior vena cava is normal in size with greater than 50%  respiratory variability, suggesting right atrial pressure of 3 mmHg.   Comparison(s): Changes from prior study are noted.   Conclusion(s)/Recommendation(s): Focal wall motion abnormalities noted,  with low normal EF and no severe valve disease.   Laboratory Data:  High Sensitivity Troponin:   Recent Labs  Lab 01/12/23 1733  01/12/23 2042  TROPONINIHS 55* 45*     Chemistry Recent Labs  Lab 01/12/23 1733  NA 144  K 3.5  CL 104  CO2 31  GLUCOSE 120*  BUN 20  CREATININE 0.88  CALCIUM 9.2  GFRNONAA 60*  ANIONGAP 9    Recent Labs  Lab 01/12/23 1733  PROT 5.9*  ALBUMIN 3.9  AST 19  ALT 11  ALKPHOS 55  BILITOT 0.5   Lipids No results for input(s): "CHOL", "TRIG", "HDL", "LABVLDL", "LDLCALC", "CHOLHDL" in the last 168 hours.  Hematology Recent Labs  Lab 01/12/23 1733  WBC 19.4*  RBC 2.94*  HGB 8.3*  HCT 26.6*  MCV 90.5  MCH 28.2  MCHC 31.2  RDW 15.9*  PLT 168   Thyroid No results for input(s): "TSH", "FREET4" in the last 168 hours.  BNP Recent Labs  Lab 01/12/23 1733  BNP 1,050.4*    DDimer  Recent Labs  Lab 01/12/23 1733  DDIMER 1.64*     Radiology/Studies:  CT Angio Chest PE W and/or Wo Contrast  Result Date: 01/12/2023 CLINICAL DATA:  Shortness of breath and positive D-dimer EXAM: CT ANGIOGRAPHY CHEST WITH CONTRAST TECHNIQUE: Multidetector CT imaging of the chest was performed using the standard protocol during bolus administration of intravenous contrast. Multiplanar CT image reconstructions and MIPs were obtained to evaluate the vascular anatomy. RADIATION DOSE REDUCTION: This exam was performed according to the departmental dose-optimization program which includes automated exposure control, adjustment of the mA and/or kV according to patient size and/or use of iterative reconstruction technique. CONTRAST:  85mL OMNIPAQUE IOHEXOL 350 MG/ML SOLN COMPARISON:  Chest x-ray from earlier in the same day. FINDINGS: Cardiovascular: Atherosclerotic calcifications of the thoracic aorta are noted without aneurysmal dilatation or dissection. No cardiac enlargement is noted. Coronary calcifications are seen. The pulmonary artery shows a normal branching pattern bilaterally. No intraluminal filling defect to suggest pulmonary embolism is noted. Mediastinum/Nodes: Thoracic inlet is within  normal limits. No hilar or mediastinal adenopathy is noted. The esophagus as visualized is within normal limits. Lungs/Pleura: Diffuse emphysematous changes are noted. No focal confluent infiltrate or sizable effusion is seen. Stable left upper lobe pulmonary nodule is noted medially on image number 27 of series 6. A pleural based nodule is noted in the right lower lobe which measures 15 mm in mean diameter. This is new from the prior exam. No pneumothorax is noted. Upper Abdomen: Renal cysts are noted particularly on the left. A large fluid collection is noted arising in an exophytic nature from the upper pole of the left kidney. This measures at least 7.4 cm in greatest dimension but is stable from the prior exam. No follow-up is recommended. Musculoskeletal: Degenerative changes of the thoracic spine are seen. No acute rib abnormality is noted. Chronic T8 compression deformity is noted. Review of the MIP images confirms the above findings. IMPRESSION: No evidence of pulmonary emboli. Right solid pulmonary nodule measuring 15 mm. Per Fleischner Society Guidelines, consider a non-contrast Chest CT at 3 months, a PET/CT, or tissue sampling. These guidelines do not apply to immunocompromised patients and patients with cancer. Follow up in patients  with significant comorbidities as clinically warranted. For lung cancer screening, adhere to Lung-RADS guidelines. Reference: Radiology. 2017; 284(1):228-43. Stable left upper lobe pulmonary nodule from the prior exam. No follow-up is recommended. Aortic Atherosclerosis (ICD10-I70.0) and Emphysema (ICD10-J43.9). Electronically Signed   By: Alcide Clever M.D.   On: 01/12/2023 21:24   DG Chest Portable 1 View  Result Date: 01/12/2023 CLINICAL DATA:  Shortness of breath EXAM: PORTABLE CHEST 1 VIEW COMPARISON:  07/09/2021 FINDINGS: Cardiomegaly. Increased interstitial markings without frank interstitial edema. Mild bilateral lower lobe opacities, likely scarring/atelectasis.  No definite pleural effusion.  No pneumothorax. Right chest port terminates at the cavoatrial junction. Median sternotomy. IMPRESSION: Cardiomegaly.  No frank interstitial edema. Mild bilateral lower lobe opacities, likely scarring/atelectasis. Electronically Signed   By: Charline Bills M.D.   On: 01/12/2023 19:02     Assessment and Plan:   Acute on chronic respiratory failure  -Patient uses 2 L oxygen 24/7 at baseline.  Complains of worsening dyspnea on exertion for the past 6 months.  -BNP elevated at 1050 on arrival.  Patient has bibasilar crackles on exam.  Makayla Fisher has no lower extremity edema.  It was difficult to differentiate if her crackle is pulmonary edema versus atelectasis versus pulmonary fibrosis.  Makayla Fisher does not appears to be significantly volume overloaded.  Suspect crackles primarily the result of atelectasis versus pulmonary fibrosis.  Will discuss with MD.  Leukocytosis: White blood cell count 19.4 on arrival.  Previous white blood cell count in May 2024 was 8.2.  Anemia: Baseline hemoglobin around 10.5, arrived with a hemoglobin of 8.3.  Emphysema: Diffuse emphysematous changes seen on previous high-resolution CT.  Possible COPD exacerbation contributing to her current acute respiratory failure.  History of CAD s/p CABG [facility]  Mitral regurgitation: Previous echocardiogram in March 2022 showed mild to moderate MR.  Makayla Fisher would not be candidate for any valve procedure  Lung nodule: 15 mm right solid pulmonary nodule seen on recent CT.  Elevated D-dimer: CTA negative for PE   Risk Assessment/Risk Scores:                For questions or updates, please contact [facility] Please consult [email or web address] for contact info under    Ramond Dial, [place]  01/13/2023 4:13 PM  Patient seen and examined with Azalee Course PA.  Agree as above, with the following exceptions and changes as noted below.  89 year old female with the  above-mentioned history admitted with progressive dyspnea on exertion over the last 6 months.  Shortness of breath progressing to the point where Makayla Fisher gets short of breath with eating. Gen: NAD, CV: RRR, no murmurs, Lungs: Fine basal crackles, Abd: soft, Extrem: Warm, DP pulses 2/4, no edema, Neuro/Psych: alert and oriented x 3, normal mood and affect. All available labs, radiology testing, previous records reviewed.   I have independently reviewed images from the CT of her chest performed this admission, emphysematous changes noted, no pleural effusions are seen.  Atherosclerosis and postsurgical changes of CABG.  Her BNP is elevated to 1000 but without obvious volume overload on exam.  Does have mild JVD elevation but with normal respiratory variation so unlikely that her shortness of breath is related to constrictive pericardial disease post CABG.  Makayla Fisher does have emphysematous changes of her lungs and did have mildly elevated pulmonary pressures on her echo 2 years ago.  I am suspicious for progressive pulmonary hypertension due to lung disease as the etiology of her progressing shortness  of breath.  Interestingly it does not seem as if Makayla Fisher has had an increasing oxygen requirement at home.  We will get an echocardiogram to clarify pulmonary pressures.  On her prior echo in 2022, RV size and function was grossly normal, pulmonary pressures elevated.  Makayla Fisher also has a degenerative appearing mitral valve with moderate mitral valve regurgitation and some evidence of splay.  Recommend next step is echocardiogram to evaluate RV size and function and pulmonary pressures.  Perhaps her BNP elevation is related to progressive RV dysfunction without obvious signs of decompensated heart failure by exam.  Parke Poisson, MD 01/13/23 5:30 PM

## 2023-01-13 NOTE — Assessment & Plan Note (Signed)
-  Continue amlodipine ?

## 2023-01-14 ENCOUNTER — Inpatient Hospital Stay (HOSPITAL_COMMUNITY): Payer: Medicare Other

## 2023-01-14 DIAGNOSIS — I5032 Chronic diastolic (congestive) heart failure: Secondary | ICD-10-CM | POA: Diagnosis not present

## 2023-01-14 DIAGNOSIS — R0602 Shortness of breath: Secondary | ICD-10-CM

## 2023-01-14 DIAGNOSIS — I5031 Acute diastolic (congestive) heart failure: Secondary | ICD-10-CM | POA: Diagnosis not present

## 2023-01-14 LAB — CBC
HCT: 25.2 % — ABNORMAL LOW (ref 36.0–46.0)
Hemoglobin: 7.7 g/dL — ABNORMAL LOW (ref 12.0–15.0)
MCH: 27.1 pg (ref 26.0–34.0)
MCHC: 30.6 g/dL (ref 30.0–36.0)
MCV: 88.7 fL (ref 80.0–100.0)
Platelets: 154 10*3/uL (ref 150–400)
RBC: 2.84 MIL/uL — ABNORMAL LOW (ref 3.87–5.11)
RDW: 15.9 % — ABNORMAL HIGH (ref 11.5–15.5)
WBC: 8.6 10*3/uL (ref 4.0–10.5)
nRBC: 0 % (ref 0.0–0.2)

## 2023-01-14 LAB — ECHOCARDIOGRAM COMPLETE
AR max vel: 1.55 cm2
AV Area VTI: 1.48 cm2
AV Area mean vel: 1.67 cm2
AV Mean grad: 6 mmHg
AV Peak grad: 13.8 mmHg
Ao pk vel: 1.86 m/s
Area-P 1/2: 3.76 cm2
MV VTI: 1.21 cm2
S' Lateral: 2.2 cm
Weight: 2296.31 oz

## 2023-01-14 LAB — BASIC METABOLIC PANEL
Anion gap: 10 (ref 5–15)
BUN: 18 mg/dL (ref 8–23)
CO2: 30 mmol/L (ref 22–32)
Calcium: 8.6 mg/dL — ABNORMAL LOW (ref 8.9–10.3)
Chloride: 99 mmol/L (ref 98–111)
Creatinine, Ser: 1.3 mg/dL — ABNORMAL HIGH (ref 0.44–1.00)
GFR, Estimated: 37 mL/min — ABNORMAL LOW (ref >60)
Glucose, Bld: 91 mg/dL (ref 70–99)
Potassium: 3.4 mmol/L — ABNORMAL LOW (ref 3.5–5.1)
Sodium: 139 mmol/L (ref 135–145)

## 2023-01-14 MED ORDER — AZITHROMYCIN 250 MG PO TABS: 500.0000 mg | ORAL_TABLET | Freq: Every day | ORAL | Status: DC

## 2023-01-14 MED ORDER — POTASSIUM CHLORIDE CRYS ER 20 MEQ PO TBCR
40.0000 meq | EXTENDED_RELEASE_TABLET | Freq: Once | ORAL | Status: AC
  Administered 2023-01-14: 40 meq via ORAL
  Filled 2023-01-14: qty 2

## 2023-01-14 MED ORDER — TIMOLOL MALEATE 0.5 % OP SOLN
1.0000 [drp] | Freq: Two times a day (BID) | OPHTHALMIC | Status: DC
  Administered 2023-01-14 – 2023-01-16 (×4): 1 [drp] via OPHTHALMIC
  Filled 2023-01-14: qty 5

## 2023-01-14 MED ORDER — FUROSEMIDE 20 MG PO TABS
20.0000 mg | ORAL_TABLET | Freq: Every day | ORAL | Status: DC
  Administered 2023-01-15: 20 mg via ORAL
  Filled 2023-01-14: qty 1

## 2023-01-14 NOTE — Progress Notes (Signed)
Echocardiogram 2D Echocardiogram has been performed.  Warren Lacy Langston [place] RDCS 01/14/2023, 2:07 PM

## 2023-01-14 NOTE — Progress Notes (Signed)
Progress Note   Patient: Makayla Fisher VFI:[id number] DOB: 1922-08-04 DOA: 01/12/2023     1 DOS: the patient was seen and examined on 01/14/2023        Brief hospital course: Mrs. Mcenaney is a 89 y.o. F with dCHF/COPD and chronic respiratory failure on 2L, NHL in remission, IDA, colostomy in place, HTN and anxiety who presented with shortness of breath at rest.  In the ER, troponin and ECG normal.  CTA chest ruled out PE, pneumonia, edema, effusion.  She was given Lasix empirically and admitted.     Assessment and Plan: * Chronic heart failure with preserved ejection fraction (HFpEF) [facility]) Cardiology evaluated patient and noted her dry lung imaging and no edema on physical exam.    Echo today showed no change in EF, no obvious pulmonary HTN, RV failure.  MR is noted She did improve overnight with IV Lasix.   Cr up this morning. - Hold diuretics today, resume home Lasix 20 PO tomorrow - Discussed MR with cardiology, medical mgmt with diuretics only - Outpatient Cardiology follow up     Iron deficiency anemia Prior hematology notes describe this as "malabsorption" iron deficiency.  Iron studies worse from May.  No recent Feraheme.  Family report more than average epistaxis but no other clinical bleeding/melena reported.  Not a candidate for colonoscopy or EGD, not a candidate for surgery or systemic therapy if cancer found, so do not recommend work up of iron deficiency. - Continue oral iron - Discussed blood transfusion here, which patient declined - Close follow up with hematology for iron infusion - Not a candidate for evaluation of iron deficiency     Chronic respiratory failure with hypoxia, on home O2 therapy (HCC) - 2 L/min continuous Acute respiratory failure ruled out.  Had severe dyspnea at rest on admission, this improved overnight with Lasix.  Today ambulated on home 2L and >92%.    Myocardial injury Mild trop elevation due to CHF.  No signs of ishcemia,  ischemia ruled out.  Pulmonary nodule Incidental finding.  - Given age and not a candidate for biopsy or therapy, recommend no follow up  Leukocytosis Unclear cause.  Doubt infection.  CAD (coronary artery disease) - Continue aspirin and atorvastatin  COPD (chronic obstructive pulmonary disease) (HCC) Exacerbation ruled out.  CT chest rules out new airspace disease. - Continue home LAMA - Outpatient Pulm follow up, consider morphine  Anxiety disorder - Continue sertraline  Hypertension - Continue amlodipine  Hypothyroidism - Continue Synthroid          Subjective: Patient incontinent, still huffing and puffing a lot with exertion.  No fever, no sputum, no confusion.       Physical Exam: BP (!) 153/53 (BP Location: Left Arm)   Pulse 68   Temp 98.4 F (36.9 C) (Oral)   Resp (!) 24   Wt 65.1 kg   SpO2 95%   BMI 26.25 kg/m   Frail elderly female, sitting up in bed, very hard of hearing RRR, soft systolic murmur, no peripheral edema, JVD Respiratory rate normal, crackles at bilateral bases, no wheezing or rales Abdomen soft no tenderness palpation or guarding, no ascites or distention Attention normal, affect appropriate, judgment Syprine normal    Data Reviewed: Basic metabolic panel shows creatinine up to 1.3, potassium 3.4 Discussed with cardiology CBC shows hemoglobin in the sevens  Family Communication: Son-in-law/POA by phone.  Niece at the bedside.    Disposition: Status is: Inpatient Severity of illness, risk of readmission given  age        Author: Alberteen Sam, MD 01/14/2023 5:27 PM  For on call review [email or web address].

## 2023-01-14 NOTE — Hospital Course (Signed)
Makayla Fisher is a 89 y.o. F with dCHF/COPD and chronic respiratory failure on 2L, NHL in remission, IDA, colostomy in place, HTN and anxiety who presented with shortness of breath at rest.  In the ER, troponin and ECG normal.  CTA chest ruled out PE, pneumonia, edema, effusion.  She was given Lasix empirically and admitted.

## 2023-01-14 NOTE — Plan of Care (Signed)
Pt able to minimally move self in bed

## 2023-01-14 NOTE — Plan of Care (Signed)
Problem: Education: Goal: Knowledge of General Education information will improve Description: Including pain rating scale, medication(s)/side effects and non-pharmacologic comfort measures Outcome: Progressing   Problem: Health Behavior/Discharge Planning: Goal: Ability to manage health-related needs will improve Outcome: Progressing   Problem: Clinical Measurements: Goal: Ability to maintain clinical measurements within normal limits will improve Outcome: Progressing Goal: Will remain free from infection Outcome: Progressing Goal: Diagnostic test results will improve Outcome: Progressing Goal: Respiratory complications will improve Outcome: Progressing Goal: Cardiovascular complication will be avoided Outcome: Progressing   Problem: Activity: Goal: Risk for activity intolerance will decrease Outcome: Progressing   Problem: Nutrition: Goal: Adequate nutrition will be maintained Outcome: Progressing   Problem: Coping: Goal: Level of anxiety will decrease Outcome: Progressing   Problem: Elimination: Goal: Will not experience complications related to bowel motility Outcome: Progressing Goal: Will not experience complications related to urinary retention Outcome: Progressing   Problem: Pain Managment: Goal: General experience of comfort will improve Outcome: Progressing   Problem: Safety: Goal: Ability to remain free from injury will improve Outcome: Progressing   Problem: Skin Integrity: Goal: Risk for impaired skin integrity will decrease Outcome: Progressing   Problem: Education: Goal: Ability to demonstrate management of disease process will improve Outcome: Progressing Goal: Ability to verbalize understanding of medication therapies will improve Outcome: Progressing Goal: Individualized Educational Video(s) Outcome: Progressing   Problem: Activity: Goal: Capacity to carry out activities will improve Outcome: Progressing   Problem: Cardiac: Goal:  Ability to achieve and maintain adequate cardiopulmonary perfusion will improve Outcome: Progressing   Problem: Education: Goal: Knowledge of disease or condition will improve Outcome: Progressing Goal: Knowledge of the prescribed therapeutic regimen will improve Outcome: Progressing Goal: Individualized Educational Video(s) Outcome: Progressing   Problem: Activity: Goal: Ability to tolerate increased activity will improve Outcome: Progressing Goal: Will verbalize the importance of balancing activity with adequate rest periods Outcome: Progressing   Problem: Respiratory: Goal: Ability to maintain a clear airway will improve Outcome: Progressing Goal: Levels of oxygenation will improve Outcome: Progressing Goal: Ability to maintain adequate ventilation will improve Outcome: Progressing

## 2023-01-15 DIAGNOSIS — R0600 Dyspnea, unspecified: Secondary | ICD-10-CM

## 2023-01-15 DIAGNOSIS — I5032 Chronic diastolic (congestive) heart failure: Secondary | ICD-10-CM

## 2023-01-15 DIAGNOSIS — I340 Nonrheumatic mitral (valve) insufficiency: Secondary | ICD-10-CM | POA: Diagnosis not present

## 2023-01-15 MED ORDER — ANTICOAGULANT SODIUM CITRATE 4% (200MG/5ML) IV SOLN
5.0000 mL | Status: AC | PRN
  Administered 2023-01-15: 5 mL
  Filled 2023-01-15: qty 5

## 2023-01-15 MED ORDER — AMLODIPINE BESYLATE 5 MG PO TABS
5.0000 mg | ORAL_TABLET | Freq: Once | ORAL | Status: AC
  Administered 2023-01-15: 5 mg via ORAL
  Filled 2023-01-15: qty 1

## 2023-01-15 MED ORDER — CALCIUM CARBONATE ANTACID 500 MG PO CHEW
1.0000 | CHEWABLE_TABLET | Freq: Two times a day (BID) | ORAL | Status: DC | PRN
  Administered 2023-01-15 – 2023-01-16 (×3): 200 mg via ORAL
  Filled 2023-01-15 (×3): qty 1

## 2023-01-15 MED ORDER — IPRATROPIUM-ALBUTEROL 0.5-2.5 (3) MG/3ML IN SOLN
3.0000 mL | Freq: Two times a day (BID) | RESPIRATORY_TRACT | Status: DC
  Administered 2023-01-15 – 2023-01-16 (×2): 3 mL via RESPIRATORY_TRACT
  Filled 2023-01-15 (×3): qty 3

## 2023-01-15 MED ORDER — NAPHAZOLINE-GLYCERIN 0.012-0.25 % OP SOLN: 1.0000 [drp] | Freq: Four times a day (QID) | OPHTHALMIC | Status: DC | PRN

## 2023-01-15 NOTE — Discharge Summary (Signed)
Follow up.   Specialty: Cardiology Contact information: [address] [place] [address] [phone]                 Discharge Instructions     Diet - low sodium heart healthy   Complete by: As directed    Discharge instructions   Complete by: As directed    **IMPORTANT DISCHARGE INSTRUCTIONS**   From Dr. Maryfrances Bunnell: You were admitted for feeling short of breath  Here, we did a CT scan that showed no pneumonia or fluid in or around the lungs It showed no pulmonary embolism (blood clot in the lungs) It showed nothing else concerning and new that could explain the shortness of breath  We also tested for a heart attack, and these tests were also normal  You got a dose of IV Lasix and your breathing got better. It may be that you had a slight heart failure flare, and this was what caused you to be worse  What is certain is that your anemia is making your breathing worse  You should go see Dr. Myna Hidalgo as soon as you can Keep taking your oral iron   In addition, we recommend you go see  your Cardiologist within 2-3 weeks   Increase activity slowly   Complete by: As directed        Discharge Exam: Filed Weights   01/14/23 0425 01/15/23 0515  Weight: 65.1 kg 66.2 kg    General: Pt is alert, awake, not in acute distress Cardiovascular: RRR, nl S1-S2, soft systolic mmurmur appreciated.   No LE edema.   Respiratory: Normal respiratory rate and rhythm.  Crackles bibasilar, no wheezing at all Abdominal: Abdomen soft and non-tender.  No distension or HSM.   Neuro/Psych: Strength symmetric in upper and lower extremities.  Judgment and insight appear slightly impaired but at baseline, A&Ox3.   Condition at discharge: fair  The results of significant diagnostics from this hospitalization (including imaging, microbiology, ancillary and laboratory) are listed below for reference.   Imaging Studies: ECHOCARDIOGRAM COMPLETE  Result Date: 01/14/2023    ECHOCARDIOGRAM REPORT   Patient Name:   AQUARIUS STOKLEY Date of Exam: 01/14/2023 Medical Rec #:  [id number]        Height:       62.0 in Accession #:    [id number]       Weight:       143.5 lb Date of Birth:  1922/06/02       BSA:          1.660 m Patient Age:    89 years         BP:           155/67 mmHg Patient Gender: F                HR:           68 bpm. Exam Location:  Inpatient Procedure: 2D Echo, Color Doppler and Cardiac Doppler Indications:    I50.31 Acute diastolic (congestive) heart failure  History:        Patient has prior history of Echocardiogram examinations, most                 recent 07/23/2020. CHF, CAD, Prior CABG, COPD; Risk                 Factors:Hypertension.  Sonographer:    Irving Burton Senior RDCS Referring Phys: [id number] MWUXLKGM AGBATA  Sonographer Comments: Scanned upright due to dyspnea IMPRESSIONS  Follow up.   Specialty: Cardiology Contact information: [address] [place] [phone]                 Discharge Instructions     Diet - low sodium heart healthy   Complete by: As directed    Discharge instructions   Complete by: As directed    **IMPORTANT DISCHARGE INSTRUCTIONS**   From Dr. Maryfrances Bunnell: You were admitted for feeling short of breath  Here, we did a CT scan that showed no pneumonia or fluid in or around the lungs It showed no pulmonary embolism (blood clot in the lungs) It showed nothing else concerning and new that could explain the shortness of breath  We also tested for a heart attack, and these tests were also normal  You got a dose of IV Lasix and your breathing got better. It may be that you had a slight heart failure flare, and this was what caused you to be worse  What is certain is that your anemia is making your breathing worse  You should go see Dr. Myna Hidalgo as soon as you can Keep taking your oral iron   In addition, we recommend you go see  your Cardiologist within 2-3 weeks   Increase activity slowly   Complete by: As directed        Discharge Exam: Filed Weights   01/14/23 0425 01/15/23 0515  Weight: 65.1 kg 66.2 kg    General: Pt is alert, awake, not in acute distress Cardiovascular: RRR, nl S1-S2, soft systolic mmurmur appreciated.   No LE edema.   Respiratory: Normal respiratory rate and rhythm.  Crackles bibasilar, no wheezing at all Abdominal: Abdomen soft and non-tender.  No distension or HSM.   Neuro/Psych: Strength symmetric in upper and lower extremities.  Judgment and insight appear slightly impaired but at baseline, A&Ox3.   Condition at discharge: fair  The results of significant diagnostics from this hospitalization (including imaging, microbiology, ancillary and laboratory) are listed below for reference.   Imaging Studies: ECHOCARDIOGRAM COMPLETE  Result Date: 01/14/2023    ECHOCARDIOGRAM REPORT   Patient Name:   AQUARIUS STOKLEY Date of Exam: 01/14/2023 Medical Rec #:  [id number]        Height:       62.0 in Accession #:    [id number]       Weight:       143.5 lb Date of Birth:  1922/06/02       BSA:          1.660 m Patient Age:    89 years         BP:           155/67 mmHg Patient Gender: F                HR:           68 bpm. Exam Location:  Inpatient Procedure: 2D Echo, Color Doppler and Cardiac Doppler Indications:    I50.31 Acute diastolic (congestive) heart failure  History:        Patient has prior history of Echocardiogram examinations, most                 recent 07/23/2020. CHF, CAD, Prior CABG, COPD; Risk                 Factors:Hypertension.  Sonographer:    Irving Burton Senior RDCS Referring Phys: [id number] MWUXLKGM AGBATA  Sonographer Comments: Scanned upright due to dyspnea IMPRESSIONS  Physician Discharge Summary   Patient: Makayla Fisher MRN: [id number] DOB: [birth year]  Admit date:     01/12/2023  Discharge date: 01/15/23  Discharge Physician: Alberteen Sam   PCP: Sunday Spillers, NP     Recommendations at discharge:  Follow up with Hematology Dr. Myna Hidalgo within 2 weeks for anemia Follow up with Cardiology Dr. Eden Emms in 2-3 weeks for mitral regurgitation and anemia and dCHF Check BMP in 1 week     Discharge Diagnoses: Principal Problem:   Dyspnea due to worsening anemia, valvular heart disease Active Problems:   Iron deficiency anemia due to malabsorption   Mitral regurgitation   Chronic diastolic congestive heart failure   Chronic respiratory failure with hypoxia, on home O2 therapy (HCC) - 2 L/min continuous   Hypothyroidism   Hypertension   Anxiety disorder   COPD (chronic obstructive pulmonary disease) (HCC)   CAD (coronary artery disease)   Leukocytosis   Pulmonary nodule   Myocardial injury     Hospital Course: Mrs. Wipf is a 89 y.o. F with dCHF/COPD and chronic respiratory failure on 2L, NHL in remission, IDA, colostomy in place, HTN and anxiety who presented with shortness of breath at rest.  In the ER, troponin and ECG normal.  CTA chest ruled out PE, pneumonia, edema, effusion.  She was given Lasix empirically and admitted.       * Dyspnea Mitral regurgitation Anemia due to iron deficiency Multifactorial.  Likely due to anemia and mitral regurgitation.  Cardiology consulted.  She improved clinically with IV Lasix. Cardiology noted she is not a candidate for instrumentation of the MV.  They recommend optimization with diuretics, possibly switching amlodipine to ARB as an outpatient.  Recommend Cardiology follow up.  With regard to anemia, her iron indices are worsening despite oral iron.  A blood transfusion was recommended here and she declined.  We no longer administer IV iron in the hospital, and so she was  referred to Hematology for this.     Iron deficiency anemia I do not recommend endoscopy/colonoscopy given age, frailty.  Chronic respiratory failure with hypoxia, on home O2 therapy (HCC) - 2 L/min continuous Acute respiratory failure ruled out.  Had severe dyspnea at rest on admission, this improved overnight with Lasix.  Today ambulated on home 2L and >92%.  Patient at very high risk of readmission given her age and progressive cardiorespiratory disease.  If she is too unstable to tolerate titration of cardiac meds as an outpaitent, would strongly recommend Palliative Care and for Cardiology and Pulmonology to consider morphine  Myocardial injury Mild trop elevation due to CHF.  No signs of ishcemia, ischemia ruled out.  Pulmonary nodule Incidental finding.  - Given age and not a candidate for biopsy or therapy, recommend no follow up  COPD (chronic obstructive pulmonary disease) (HCC) Exacerbation ruled out.  CT chest rules out new airspace disease. - Continue home LAMA - Outpatient Pulm follow up, consider morphine             The [facility] Controlled Substances Registry was reviewed for this patient prior to discharge.   Consultants: Cardiology Procedures performed: Echo  Disposition: Home Diet recommendation:  Discharge Diet Orders (From admission, onward)     Start     Ordered   01/15/23 0000  Diet - low sodium heart healthy        01/15/23 1102            DISCHARGE MEDICATION: Allergies as of 01/15/2023  Follow up.   Specialty: Cardiology Contact information: [address] [place] [phone]                 Discharge Instructions     Diet - low sodium heart healthy   Complete by: As directed    Discharge instructions   Complete by: As directed    **IMPORTANT DISCHARGE INSTRUCTIONS**   From Dr. Maryfrances Bunnell: You were admitted for feeling short of breath  Here, we did a CT scan that showed no pneumonia or fluid in or around the lungs It showed no pulmonary embolism (blood clot in the lungs) It showed nothing else concerning and new that could explain the shortness of breath  We also tested for a heart attack, and these tests were also normal  You got a dose of IV Lasix and your breathing got better. It may be that you had a slight heart failure flare, and this was what caused you to be worse  What is certain is that your anemia is making your breathing worse  You should go see Dr. Myna Hidalgo as soon as you can Keep taking your oral iron   In addition, we recommend you go see  your Cardiologist within 2-3 weeks   Increase activity slowly   Complete by: As directed        Discharge Exam: Filed Weights   01/14/23 0425 01/15/23 0515  Weight: 65.1 kg 66.2 kg    General: Pt is alert, awake, not in acute distress Cardiovascular: RRR, nl S1-S2, soft systolic mmurmur appreciated.   No LE edema.   Respiratory: Normal respiratory rate and rhythm.  Crackles bibasilar, no wheezing at all Abdominal: Abdomen soft and non-tender.  No distension or HSM.   Neuro/Psych: Strength symmetric in upper and lower extremities.  Judgment and insight appear slightly impaired but at baseline, A&Ox3.   Condition at discharge: fair  The results of significant diagnostics from this hospitalization (including imaging, microbiology, ancillary and laboratory) are listed below for reference.   Imaging Studies: ECHOCARDIOGRAM COMPLETE  Result Date: 01/14/2023    ECHOCARDIOGRAM REPORT   Patient Name:   AQUARIUS STOKLEY Date of Exam: 01/14/2023 Medical Rec #:  [id number]        Height:       62.0 in Accession #:    [id number]       Weight:       143.5 lb Date of Birth:  1922/06/02       BSA:          1.660 m Patient Age:    89 years         BP:           155/67 mmHg Patient Gender: F                HR:           68 bpm. Exam Location:  Inpatient Procedure: 2D Echo, Color Doppler and Cardiac Doppler Indications:    I50.31 Acute diastolic (congestive) heart failure  History:        Patient has prior history of Echocardiogram examinations, most                 recent 07/23/2020. CHF, CAD, Prior CABG, COPD; Risk                 Factors:Hypertension.  Sonographer:    Irving Burton Senior RDCS Referring Phys: [id number] MWUXLKGM AGBATA  Sonographer Comments: Scanned upright due to dyspnea IMPRESSIONS  Physician Discharge Summary   Patient: Makayla Fisher MRN: [id number] DOB: [birth year]  Admit date:     01/12/2023  Discharge date: 01/15/23  Discharge Physician: Alberteen Sam   PCP: Sunday Spillers, NP     Recommendations at discharge:  Follow up with Hematology Dr. Myna Hidalgo within 2 weeks for anemia Follow up with Cardiology Dr. Eden Emms in 2-3 weeks for mitral regurgitation and anemia and dCHF Check BMP in 1 week     Discharge Diagnoses: Principal Problem:   Dyspnea due to worsening anemia, valvular heart disease Active Problems:   Iron deficiency anemia due to malabsorption   Mitral regurgitation   Chronic diastolic congestive heart failure   Chronic respiratory failure with hypoxia, on home O2 therapy (HCC) - 2 L/min continuous   Hypothyroidism   Hypertension   Anxiety disorder   COPD (chronic obstructive pulmonary disease) (HCC)   CAD (coronary artery disease)   Leukocytosis   Pulmonary nodule   Myocardial injury     Hospital Course: Mrs. Wipf is a 89 y.o. F with dCHF/COPD and chronic respiratory failure on 2L, NHL in remission, IDA, colostomy in place, HTN and anxiety who presented with shortness of breath at rest.  In the ER, troponin and ECG normal.  CTA chest ruled out PE, pneumonia, edema, effusion.  She was given Lasix empirically and admitted.       * Dyspnea Mitral regurgitation Anemia due to iron deficiency Multifactorial.  Likely due to anemia and mitral regurgitation.  Cardiology consulted.  She improved clinically with IV Lasix. Cardiology noted she is not a candidate for instrumentation of the MV.  They recommend optimization with diuretics, possibly switching amlodipine to ARB as an outpatient.  Recommend Cardiology follow up.  With regard to anemia, her iron indices are worsening despite oral iron.  A blood transfusion was recommended here and she declined.  We no longer administer IV iron in the hospital, and so she was  referred to Hematology for this.     Iron deficiency anemia I do not recommend endoscopy/colonoscopy given age, frailty.  Chronic respiratory failure with hypoxia, on home O2 therapy (HCC) - 2 L/min continuous Acute respiratory failure ruled out.  Had severe dyspnea at rest on admission, this improved overnight with Lasix.  Today ambulated on home 2L and >92%.  Patient at very high risk of readmission given her age and progressive cardiorespiratory disease.  If she is too unstable to tolerate titration of cardiac meds as an outpaitent, would strongly recommend Palliative Care and for Cardiology and Pulmonology to consider morphine  Myocardial injury Mild trop elevation due to CHF.  No signs of ishcemia, ischemia ruled out.  Pulmonary nodule Incidental finding.  - Given age and not a candidate for biopsy or therapy, recommend no follow up  COPD (chronic obstructive pulmonary disease) (HCC) Exacerbation ruled out.  CT chest rules out new airspace disease. - Continue home LAMA - Outpatient Pulm follow up, consider morphine             The [facility] Controlled Substances Registry was reviewed for this patient prior to discharge.   Consultants: Cardiology Procedures performed: Echo  Disposition: Home Diet recommendation:  Discharge Diet Orders (From admission, onward)     Start     Ordered   01/15/23 0000  Diet - low sodium heart healthy        01/15/23 1102            DISCHARGE MEDICATION: Allergies as of 01/15/2023  Physician Discharge Summary   Patient: Makayla Fisher MRN: [id number] DOB: [birth year]  Admit date:     01/12/2023  Discharge date: 01/15/23  Discharge Physician: Alberteen Sam   PCP: Sunday Spillers, NP     Recommendations at discharge:  Follow up with Hematology Dr. Myna Hidalgo within 2 weeks for anemia Follow up with Cardiology Dr. Eden Emms in 2-3 weeks for mitral regurgitation and anemia and dCHF Check BMP in 1 week     Discharge Diagnoses: Principal Problem:   Dyspnea due to worsening anemia, valvular heart disease Active Problems:   Iron deficiency anemia due to malabsorption   Mitral regurgitation   Chronic diastolic congestive heart failure   Chronic respiratory failure with hypoxia, on home O2 therapy (HCC) - 2 L/min continuous   Hypothyroidism   Hypertension   Anxiety disorder   COPD (chronic obstructive pulmonary disease) (HCC)   CAD (coronary artery disease)   Leukocytosis   Pulmonary nodule   Myocardial injury     Hospital Course: Mrs. Wipf is a 89 y.o. F with dCHF/COPD and chronic respiratory failure on 2L, NHL in remission, IDA, colostomy in place, HTN and anxiety who presented with shortness of breath at rest.  In the ER, troponin and ECG normal.  CTA chest ruled out PE, pneumonia, edema, effusion.  She was given Lasix empirically and admitted.       * Dyspnea Mitral regurgitation Anemia due to iron deficiency Multifactorial.  Likely due to anemia and mitral regurgitation.  Cardiology consulted.  She improved clinically with IV Lasix. Cardiology noted she is not a candidate for instrumentation of the MV.  They recommend optimization with diuretics, possibly switching amlodipine to ARB as an outpatient.  Recommend Cardiology follow up.  With regard to anemia, her iron indices are worsening despite oral iron.  A blood transfusion was recommended here and she declined.  We no longer administer IV iron in the hospital, and so she was  referred to Hematology for this.     Iron deficiency anemia I do not recommend endoscopy/colonoscopy given age, frailty.  Chronic respiratory failure with hypoxia, on home O2 therapy (HCC) - 2 L/min continuous Acute respiratory failure ruled out.  Had severe dyspnea at rest on admission, this improved overnight with Lasix.  Today ambulated on home 2L and >92%.  Patient at very high risk of readmission given her age and progressive cardiorespiratory disease.  If she is too unstable to tolerate titration of cardiac meds as an outpaitent, would strongly recommend Palliative Care and for Cardiology and Pulmonology to consider morphine  Myocardial injury Mild trop elevation due to CHF.  No signs of ishcemia, ischemia ruled out.  Pulmonary nodule Incidental finding.  - Given age and not a candidate for biopsy or therapy, recommend no follow up  COPD (chronic obstructive pulmonary disease) (HCC) Exacerbation ruled out.  CT chest rules out new airspace disease. - Continue home LAMA - Outpatient Pulm follow up, consider morphine             The [facility] Controlled Substances Registry was reviewed for this patient prior to discharge.   Consultants: Cardiology Procedures performed: Echo  Disposition: Home Diet recommendation:  Discharge Diet Orders (From admission, onward)     Start     Ordered   01/15/23 0000  Diet - low sodium heart healthy        01/15/23 1102            DISCHARGE MEDICATION: Allergies as of 01/15/2023  Physician Discharge Summary   Patient: Makayla Fisher MRN: [id number] DOB: [birth year]  Admit date:     01/12/2023  Discharge date: 01/15/23  Discharge Physician: Alberteen Sam   PCP: Sunday Spillers, NP     Recommendations at discharge:  Follow up with Hematology Dr. Myna Hidalgo within 2 weeks for anemia Follow up with Cardiology Dr. Eden Emms in 2-3 weeks for mitral regurgitation and anemia and dCHF Check BMP in 1 week     Discharge Diagnoses: Principal Problem:   Dyspnea due to worsening anemia, valvular heart disease Active Problems:   Iron deficiency anemia due to malabsorption   Mitral regurgitation   Chronic diastolic congestive heart failure   Chronic respiratory failure with hypoxia, on home O2 therapy (HCC) - 2 L/min continuous   Hypothyroidism   Hypertension   Anxiety disorder   COPD (chronic obstructive pulmonary disease) (HCC)   CAD (coronary artery disease)   Leukocytosis   Pulmonary nodule   Myocardial injury     Hospital Course: Mrs. Wipf is a 89 y.o. F with dCHF/COPD and chronic respiratory failure on 2L, NHL in remission, IDA, colostomy in place, HTN and anxiety who presented with shortness of breath at rest.  In the ER, troponin and ECG normal.  CTA chest ruled out PE, pneumonia, edema, effusion.  She was given Lasix empirically and admitted.       * Dyspnea Mitral regurgitation Anemia due to iron deficiency Multifactorial.  Likely due to anemia and mitral regurgitation.  Cardiology consulted.  She improved clinically with IV Lasix. Cardiology noted she is not a candidate for instrumentation of the MV.  They recommend optimization with diuretics, possibly switching amlodipine to ARB as an outpatient.  Recommend Cardiology follow up.  With regard to anemia, her iron indices are worsening despite oral iron.  A blood transfusion was recommended here and she declined.  We no longer administer IV iron in the hospital, and so she was  referred to Hematology for this.     Iron deficiency anemia I do not recommend endoscopy/colonoscopy given age, frailty.  Chronic respiratory failure with hypoxia, on home O2 therapy (HCC) - 2 L/min continuous Acute respiratory failure ruled out.  Had severe dyspnea at rest on admission, this improved overnight with Lasix.  Today ambulated on home 2L and >92%.  Patient at very high risk of readmission given her age and progressive cardiorespiratory disease.  If she is too unstable to tolerate titration of cardiac meds as an outpaitent, would strongly recommend Palliative Care and for Cardiology and Pulmonology to consider morphine  Myocardial injury Mild trop elevation due to CHF.  No signs of ishcemia, ischemia ruled out.  Pulmonary nodule Incidental finding.  - Given age and not a candidate for biopsy or therapy, recommend no follow up  COPD (chronic obstructive pulmonary disease) (HCC) Exacerbation ruled out.  CT chest rules out new airspace disease. - Continue home LAMA - Outpatient Pulm follow up, consider morphine             The [facility] Controlled Substances Registry was reviewed for this patient prior to discharge.   Consultants: Cardiology Procedures performed: Echo  Disposition: Home Diet recommendation:  Discharge Diet Orders (From admission, onward)     Start     Ordered   01/15/23 0000  Diet - low sodium heart healthy        01/15/23 1102            DISCHARGE MEDICATION: Allergies as of 01/15/2023

## 2023-01-15 NOTE — TOC Transition Note (Signed)
Transition of Care [facility]) - CM/SW Discharge Note   Patient Details  Name: Makayla Fisher MRN: [id number] Date of Birth: August 17, 1922  Transition of [facility]) CM/SW Contact:  Patrice Paradise, LCSW Phone Number: 01/15/2023, 11:37 AM   Clinical Narrative:     Patient will DC to:?[facility] Anticipated DC date:?01/15/2023 Family notified:?[facility] WU:[id number]   Per MD patient ready for DC to [facility].. RN, patient, patient's family, and facility notified of DC. Discharge Summary sent to facility. RN given number for report  [phone] ask for [facility]. DC packet on chart. Ambulance transport requested for patient.   CSW signing off.   Judd Lien, [place] [phone]         Patient Goals and CMS Choice      Discharge Placement                         Discharge Plan and Services Additional resources added to the After Visit Summary for                                       Social Determinants of Health (SDOH) Interventions SDOH Screenings   Food Insecurity: No Food Insecurity (01/13/2023)  Housing: Patient Unable To Answer (01/13/2023)  Transportation Needs: No Transportation Needs (01/13/2023)  Utilities: Not At Risk (01/13/2023)  Tobacco Use: Medium Risk (01/12/2023)     Readmission Risk Interventions     No data to display

## 2023-01-15 NOTE — TOC Progression Note (Signed)
Transition of Care [facility]) - Progression Note    Patient Details  Name: Makayla Fisher MRN: [id number] Date of Birth: January 25, 1923  Transition of Care [facility]) CM/SW Contact  Patrice Paradise, LCSW Phone Number: 01/15/2023, 10:31 AM  Clinical Narrative:      CSW was alerted by [facility] that pt was ready for DC. RNCM spoke with pt's niece. CSW reached out to [facility] and confirmed discharge today with Laurann Montana stated the Fl2 and DC summary should be faxed to [phone].     TOC team will continue to assist with discharge planning needs.     Expected Discharge Plan and Services         Expected Discharge Date: 01/15/23                                     Social Determinants of Health (SDOH) Interventions SDOH Screenings   Food Insecurity: No Food Insecurity (01/13/2023)  Housing: Patient Unable To Answer (01/13/2023)  Transportation Needs: No Transportation Needs (01/13/2023)  Utilities: Not At Risk (01/13/2023)  Tobacco Use: Medium Risk (01/12/2023)    Readmission Risk Interventions     No data to display

## 2023-01-15 NOTE — Progress Notes (Signed)
Report given to [facility]. Informed that discharge summary, FL2, and any new orders need to be faxed. Social worker informed.

## 2023-01-15 NOTE — Progress Notes (Signed)
  CSW was alerted by pt's RN and the facility ([facility]) that DC paperwork was not received by fax. CSW attempted to fax it 3 times to [phone] however fax failed. CSW asked for another fax number.  [facility] informed CSW that it was not another fax. CSW asked if it could be sent with pt. [facility] confirmed that it could be sent with pt. DC documents sent in DC packet.

## 2023-01-15 NOTE — Care Management (Addendum)
Spoke w patient's niece who confirms that patient has oxygen at ALF, she also states that patient will need transportation. I have notified CSW assigned to this patient.   Damaris Hippo (Niece) [phone]

## 2023-01-15 NOTE — Plan of Care (Signed)
  Problem: Education: Goal: Knowledge of General Education information will improve Description: Including pain rating scale, medication(s)/side effects and non-pharmacologic comfort measures Outcome: Progressing   Problem: Health Behavior/Discharge Planning: Goal: Ability to manage health-related needs will improve Outcome: Progressing   Problem: Clinical Measurements: Goal: Ability to maintain clinical measurements within normal limits will improve Outcome: Progressing Goal: Will remain free from infection Outcome: Progressing Goal: Diagnostic test results will improve Outcome: Progressing Goal: Respiratory complications will improve Outcome: Progressing Goal: Cardiovascular complication will be avoided Outcome: Progressing   Problem: Activity: Goal: Risk for activity intolerance will decrease Outcome: Progressing   Problem: Nutrition: Goal: Adequate nutrition will be maintained Outcome: Progressing   Problem: Coping: Goal: Level of anxiety will decrease Outcome: Progressing   Problem: Elimination: Goal: Will not experience complications related to bowel motility Outcome: Progressing Goal: Will not experience complications related to urinary retention Outcome: Progressing   Problem: Pain Managment: Goal: General experience of comfort will improve Outcome: Progressing   Problem: Safety: Goal: Ability to remain free from injury will improve Outcome: Progressing   Problem: Skin Integrity: Goal: Risk for impaired skin integrity will decrease Outcome: Progressing   Problem: Education: Goal: Ability to demonstrate management of disease process will improve Outcome: Progressing Goal: Ability to verbalize understanding of medication therapies will improve Outcome: Progressing Goal: Individualized Educational Video(s) Outcome: Progressing   Problem: Activity: Goal: Capacity to carry out activities will improve Outcome: Progressing   Problem: Cardiac: Goal:  Ability to achieve and maintain adequate cardiopulmonary perfusion will improve Outcome: Progressing   Problem: Education: Goal: Knowledge of disease or condition will improve Outcome: Progressing Goal: Knowledge of the prescribed therapeutic regimen will improve Outcome: Progressing Goal: Individualized Educational Video(s) Outcome: Progressing   Problem: Activity: Goal: Ability to tolerate increased activity will improve Outcome: Progressing Goal: Will verbalize the importance of balancing activity with adequate rest periods Outcome: Progressing   Problem: Respiratory: Goal: Ability to maintain a clear airway will improve Outcome: Progressing Goal: Levels of oxygenation will improve Outcome: Progressing Goal: Ability to maintain adequate ventilation will improve Outcome: Progressing   

## 2023-01-15 NOTE — Progress Notes (Signed)
Progress Note  Patient Name: Makayla Fisher Date of Encounter: 01/15/2023  Primary Cardiologist: Charlton Haws, MD  Interval Summary   No events overnight.  We discussed the results of her echocardiogram.  She does not report any shortness of breath at rest now, eating breakfast.  Vital Signs    Vitals:   01/14/23 2335 01/15/23 0515 01/15/23 0724 01/15/23 0832  BP: (!) 134/46 (!) 197/68 (!) 179/89   Pulse: (!) 56 61 60   Resp: 20 20 20 19   Temp: 98.1 F (36.7 C) 98 F (36.7 C) 98.4 F (36.9 C)   TempSrc: Oral Oral Oral   SpO2: 95% 92% 94% 95%  Weight:  66.2 kg      Intake/Output Summary (Last 24 hours) at 01/15/2023 0925 Last data filed at 01/15/2023 0100 Gross per 24 hour  Intake 120 ml  Output 251 ml  Net -131 ml   Filed Weights   01/14/23 0425 01/15/23 0515  Weight: 65.1 kg 66.2 kg    Physical Exam   GEN: No acute distress.   Neck: No JVD. Cardiac: RRR, 3/6 systolic murmur at apex, no gallop.  Respiratory: Nonlabored.  Decreased breath sounds at the bases. GI: Soft, nontender, bowel sounds present. MS: No edema.  Kyphosis. Neuro:  Nonfocal. Psych: Alert and oriented x 3. Normal affect.  ECG/Telemetry    Telemetry reviewed showing sinus rhythm this morning.  Labs    Chemistry Recent Labs  Lab 01/12/23 1733 01/14/23 0500  NA 144 139  K 3.5 3.4*  CL 104 99  CO2 31 30  GLUCOSE 120* 91  BUN 20 18  CREATININE 0.88 1.30*  CALCIUM 9.2 8.6*  PROT 5.9*  --   ALBUMIN 3.9  --   AST 19  --   ALT 11  --   ALKPHOS 55  --   BILITOT 0.5  --   GFRNONAA 60* 37*  ANIONGAP 9 10    Hematology Recent Labs  Lab 01/12/23 1733 01/13/23 1530 01/14/23 0500  WBC 19.4* 10.8* 8.6  RBC 2.94* 2.88* 2.84*  HGB 8.3* 8.2* 7.7*  HCT 26.6* 26.7* 25.2*  MCV 90.5 92.7 88.7  MCH 28.2 28.5 27.1  MCHC 31.2 30.7 30.6  RDW 15.9* 15.9* 15.9*  PLT 168 159 154   Cardiac Enzymes Recent Labs  Lab 01/12/23 1733 01/12/23 2042  TROPONINIHS 55* 45*   Lipid Panel      Component Value Date/Time   CHOL 213 (H) 11/27/2019 0547   TRIG 97 11/27/2019 0547   HDL 58 11/27/2019 0547   CHOLHDL 3.7 11/27/2019 0547   VLDL 19 11/27/2019 0547   LDLCALC 136 (H) 11/27/2019 0547    Cardiac Studies   Echocardiogram 01/14/2023:  1. Left ventricular ejection fraction, by estimation, is 55 to 60%. The  left ventricle has normal function. The left ventricle has no regional  wall motion abnormalities. There is moderate asymmetric left ventricular  hypertrophy of the basal segment.  Left ventricular diastolic parameters are indeterminate.   2. Right ventricular systolic function is normal. The right ventricular  size is normal. There is normal pulmonary artery systolic pressure. The  estimated right ventricular systolic pressure is 29.0 mmHg.   3. Left atrial size was moderately dilated.   4. The mitral valve is degenerative. Moderate mitral valve regurgitation.  The mean mitral valve gradient is 3.0 mmHg with average heart rate of 69  bpm. Moderate mitral annular calcification.   5. The aortic valve is calcified. Aortic valve regurgitation is not  visualized. Aortic valve sclerosis/calcification is present, without any  evidence of aortic stenosis. Aortic valve mean gradient measures 6.0 mmHg.   6. The inferior vena cava is normal in size with greater than 50%  respiratory variability, suggesting right atrial pressure of 3 mmHg.   Assessment & Plan   1.  Presentation with worsening shortness of breath in the setting of chronic hypoxic respiratory failure requiring oxygen supplementation, acute on chronic anemia, COPD, and mitral regurgitation which looks to be at least moderate and eccentric by follow-up echocardiogram.  LVEF remains normal at 55 to 60% and RV contraction also normal as well as estimated RVSP.  2.  Multivessel CAD status post CABG in 2009 in [place].  No clear evidence of ACS at this point with mildly elevated flat high-sensitivity  troponin I levels more consistent with demand ischemia.  Mitral regurgitation has likely progressed over time and may be impacting symptoms which are otherwise multifactorial, however she is not a candidate for valve intervention at this point and would manage symptoms conservatively.  She did have some improvement with IV diuresis, agree with resuming oral diuretic regimen.  Also focus on blood pressure control, she is currently on Norvasc, may need to consider addition of ARB depending on blood pressure trend.  Would arrange outpatient follow-up with cardiology as before.  For questions or updates, please contact [facility] Please consult [email or web address] for contact info under   Signed, Nona Dell, MD  01/15/2023, 9:25 AM

## 2023-01-15 NOTE — NC FL2 (Signed)
[place] MEDICAID [id number] LEVEL OF CARE FORM     IDENTIFICATION  Patient Name: Makayla Fisher Birthdate: [birth year] Sex: female Admission Date (Current Location): 01/12/2023  [facility] and [place] Number:  Producer, television/film/video and Address:  [facility], [address] [place], [place] [address]      Provider Number: [id number]  Attending Physician Name and Address:  Alberteen Sam, *  Relative Name and Phone Number:  Damaris Hippo [phone]    Current Level of Care: Hospital Recommended Level of Care: Assisted Living Facility Prior Approval Number:    Date Approved/Denied:   PASRR Number:    Discharge Plan:  (Assisted Living facility)    Current Diagnoses: Patient Active Problem List   Diagnosis Date Noted   Iron deficiency anemia 01/13/2023   Chronic heart failure with preserved ejection fraction (HFpEF) (HCC) 01/13/2023   Leukocytosis 01/13/2023   Pulmonary nodule 01/13/2023   Myocardial injury 01/13/2023   CAD (coronary artery disease)    Hypokalemia 07/10/2021   Hypomagnesemia 07/10/2021   COPD (chronic obstructive pulmonary disease) (HCC) 07/09/2021   Chronic respiratory failure with hypoxia, on home O2 therapy (HCC) - 2 L/min continuous 03/26/2021   Heparin allergy 07/23/2020   Obesity 11/28/2019   Hx of migraines 11/28/2019   GERD (gastroesophageal reflux disease) 11/28/2019   Dyslipidemia 04/30/2017   Hx of CABG x 3/stent 04/30/2017   Bilateral carotid artery stenosis 04/30/2017   Hypothyroidism 04/30/2017   Hypertension 04/30/2017   S/P colostomy (HCC) 04/30/2017   Anxiety disorder 04/30/2017   Glaucoma 12/13/2016   Colostomy in place [facility]) 12/13/2016   Vitamin D deficiency 10/20/2016   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) 04/22/2016   Iron deficiency anemia due to chronic blood loss 08/07/2014   HTN (hypertension) 07/15/2014   Unilateral carotid artery disease (HCC) 07/15/2014   Colon  stricture (HCC) 06/13/2014   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/2014   NHL (non-Hodgkin's lymphoma) (HCC) 10/18/2013   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    Orientation RESPIRATION BLADDER Height & Weight     Self, Time, Situation, Place  O2 Incontinent Weight: 146 lb (66.2 kg) Height:     BEHAVIORAL SYMPTOMS/MOOD NEUROLOGICAL BOWEL NUTRITION STATUS      Continent Diet (See DC summary)  AMBULATORY STATUS COMMUNICATION OF NEEDS Skin     Verbally                         Personal Care Assistance Level of Assistance              Functional Limitations Info  Sight, Hearing, Speech Sight Info: Adequate Hearing Info: Impaired Speech Info: Adequate    SPECIAL CARE FACTORS FREQUENCY                       Contractures      Additional Factors Info  Code Status, Allergies Code Status Info: Full Allergies Info: Epipen (Epinephrine Hcl (Nasal)), Levaquin (Levofloxacin In D5w), Beta Adrenergic Blockers, Chlorhexidine Gluconate, Heparin, Sulfa Antibiotics           Discharge Medications:    Epipen [epinephrine Hcl (nasal)] Palpitations    Levaquin [levofloxacin In D5w] Other (See Comments)    Weakness- "Allergic," per MAR    Beta Adrenergic Blockers Other (See Comments)    intolerant to higher doses of BB due to bradycardia.    Chlorhexidine Gluconate Other (See Comments)    Pt declined use and  suspects she may have an intolerance    Heparin Other (See Comments)    "Allergic," pre MAR- Maybe affected platelets, per patient Pt has tolerated heparin flush many times since 2015    Sulfa Antibiotics Rash            Medication List       TAKE these medications     amLODipine 5 MG tablet Commonly known as: NORVASC Take 1 tablet (5 mg total) by mouth daily.    ARTIFICIAL TEAR OP Place 1 drop into both eyes as needed (dry eyes).    Aspirin Low Dose 81 MG tablet Generic drug: aspirin EC Take 1 tablet (81 mg total) by mouth daily. Swallow whole.     atorvastatin 40 MG tablet Commonly known as: LIPITOR Take 1 tablet (40 mg total) by mouth at bedtime.    CALCIUM 600+D3 PO Take 1 tablet by mouth at bedtime.    Cranberry 425 MG Caps Take 425 mg by mouth daily.    DAILY VITE PO Take 1 tablet by mouth daily.    docusate sodium 100 MG capsule Commonly known as: COLACE Take 100 mg by mouth daily as needed for mild constipation.    Ferrex 150 150 MG capsule Generic drug: iron polysaccharides Take by mouth 2 (two) times daily.    furosemide 20 MG tablet Commonly known as: LASIX Take 1 tablet (20 mg total) by mouth daily.    hydrOXYzine 50 MG tablet Commonly known as: ATARAX SMARTSIG:1 Tablet(s) By Mouth Every 12 Hours PRN    Incruse Ellipta 62.5 MCG/ACT Aepb Generic drug: umeclidinium bromide Inhale 1 puff into the lungs daily.    ipratropium-albuterol 0.5-2.5 (3) MG/3ML Soln Commonly known as: DUONEB Take 3 mLs by nebulization every 12 (twelve) hours.    levothyroxine 25 MCG tablet Commonly known as: SYNTHROID Take 25 mcg by mouth daily before breakfast.    lidocaine-prilocaine cream Commonly known as: EMLA Apply 1 application topically See admin instructions. Apply to port-a-cath one hour prior to procedure    Mucus Relief 600 MG 12 hr tablet Generic drug: guaiFENesin Take by mouth.    nystatin cream Commonly known as: MYCOSTATIN Apply 1 application topically every 12 (twelve) hours as needed (to rash between the legs and groin).    OXYGEN Inhale 2 L into the lungs continuous.    PRESERVISION AREDS PO Take 1 tablet by mouth in the morning and at bedtime.    psyllium 58.6 % packet Commonly known as: METAMUCIL Take 1 packet by mouth every Monday, Wednesday, and Friday. ORANGE    rOPINIRole 1 MG tablet Commonly known as: REQUIP Take 1 mg by mouth at bedtime.    sertraline 25 MG tablet Commonly known as: ZOLOFT Take 25 mg by mouth daily.    timolol 0.5 % ophthalmic solution Commonly known as:  TIMOPTIC Place 1 drop into both eyes 2 (two) times daily.    trimethoprim 100 MG tablet Commonly known as: TRIMPEX Take 100 mg by mouth daily.    Vitamin D3 Super Strength 50 MCG (2000 UT) Caps Generic drug: Cholecalciferol Take 4,000 Units by mouth daily.    Relevant Imaging Results:  Relevant Lab Results:   Additional Information SSN# [id number]  Patrice Paradise, LCSW

## 2023-01-16 DIAGNOSIS — I5032 Chronic diastolic (congestive) heart failure: Secondary | ICD-10-CM | POA: Diagnosis not present

## 2023-01-16 MED ORDER — AZITHROMYCIN 250 MG PO TABS
500.0000 mg | ORAL_TABLET | Freq: Every day | ORAL | Status: DC
  Administered 2023-01-16: 500 mg via ORAL
  Filled 2023-01-16: qty 2

## 2023-01-16 MED ORDER — FUROSEMIDE 20 MG PO TABS
20.0000 mg | ORAL_TABLET | Freq: Every day | ORAL | Status: DC
  Administered 2023-01-16: 20 mg via ORAL
  Filled 2023-01-16: qty 1

## 2023-01-16 MED ORDER — ALPRAZOLAM 0.5 MG PO TABS: 0.5000 mg | ORAL_TABLET | Freq: Three times a day (TID) | ORAL | 0 refills | Status: DC | PRN

## 2023-01-16 MED ORDER — AZITHROMYCIN 250 MG PO TABS: 250.0000 mg | ORAL_TABLET | Freq: Every day | ORAL | 0 refills | Status: DC

## 2023-01-16 MED ORDER — AMLODIPINE BESYLATE 10 MG PO TABS
10.0000 mg | ORAL_TABLET | Freq: Every day | ORAL | Status: DC
  Administered 2023-01-16: 10 mg via ORAL
  Filled 2023-01-16: qty 1

## 2023-01-16 MED ORDER — INFLUENZA VAC A&B SURF ANT ADJ 0.5 ML IM SUSY
0.5000 mL | PREFILLED_SYRINGE | INTRAMUSCULAR | Status: DC
  Filled 2023-01-16: qty 0.5

## 2023-01-16 MED ORDER — INFLUENZA VAC A&B SURF ANT ADJ 0.5 ML IM SUSY: 0.5000 mL | PREFILLED_SYRINGE | INTRAMUSCULAR | Status: DC

## 2023-01-16 NOTE — TOC Transition Note (Signed)
Transition of Care [facility]) - CM/SW Discharge Note   Patient Details  Name: Makayla Fisher MRN: [id number] Date of Birth: Sep 26, 1922  Transition of Care [facility]) CM/SW Contact:  Leander Rams, LCSW Phone Number: 01/16/2023, 11:51 AM   Clinical Narrative:    Patient will DC to: [facility]  Anticipated DC date: 01/16/2023 Family notified: Joyce Gross Transport by: Sharin Mons   Per MD patient ready for DC to [facility] RN, patient, patient's family, and facility notified of DC. Discharge Summary and FL2 sent to facility. RN to call report prior to discharge [phone]. DC packet on chart. Ambulance transport requested for patient.   CSW will sign off for now as social work intervention is no longer needed. Please consult [place] again if new needs arise.    Final next level of care: Assisted Living Barriers to Discharge: No Barriers Identified   Patient Goals and CMS Choice      Discharge Placement                Patient chooses bed at: [facility] [place] Patient to be transferred to facility by: [facility] Name of family member notified: Joyce Gross Patient and family notified of of transfer: 01/16/23  Discharge Plan and Services Additional resources added to the After Visit Summary for                                       Social Determinants of Health (SDOH) Interventions SDOH Screenings   Food Insecurity: No Food Insecurity (01/13/2023)  Housing: Patient Unable To Answer (01/13/2023)  Transportation Needs: No Transportation Needs (01/13/2023)  Utilities: Not At Risk (01/13/2023)  Tobacco Use: Medium Risk (01/12/2023)     Readmission Risk Interventions     No data to display           Oletta Lamas, MSW, LCSWA, LCASA Transitions of Care  Clinical Social Worker I

## 2023-01-16 NOTE — Progress Notes (Signed)
Heart Failure Navigator Progress Note  Assessed for Heart & Vascular TOC clinic readiness.  Patient does not meet criteria due to EF 55-60%, dyspnea likely related to Mitral Valve. .   Navigator will sign off at this time.   Rhae Hammock, BSN, Scientist, clinical (histocompatibility and immunogenetics) Only

## 2023-01-16 NOTE — Care Management Important Message (Signed)
Important Message  Patient Details  Name: Makayla Fisher MRN: [id number] Date of Birth: 23-Apr-1923   Important Message Given:        Dorena Bodo 01/16/2023, 3:41 PM

## 2023-01-16 NOTE — Plan of Care (Signed)
Problem: Nutrition: Goal: Adequate nutrition will be maintained Outcome: Completed/Met   Problem: Elimination: Goal: Will not experience complications related to bowel motility Outcome: Completed/Met Goal: Will not experience complications related to urinary retention Outcome: Completed/Met   Problem: Pain Managment: Goal: General experience of comfort will improve Outcome: Completed/Met

## 2023-01-16 NOTE — Discharge Summary (Addendum)
eyes 2 (two) times daily.   trimethoprim 100 MG tablet Commonly known as: TRIMPEX Take 100 mg by mouth daily.   Vitamin D3 Super Strength 50 MCG (2000 UT) Caps Generic drug: Cholecalciferol Take 4,000 Units by mouth daily.         Follow-up Information     Josph Macho, MD Follow up.   Specialty: Oncology Contact information: [address] [place] [address] [phone]         Wendall Stade, MD Follow up.   Specialty: Cardiology Contact information: [address] [place] [address] [phone]                 Discharge Instructions     Ambulatory referral to Hematology / Oncology   Complete by: As directed    Diet - low sodium heart healthy   Complete by: As directed    Discharge instructions   Complete by: As directed    **IMPORTANT DISCHARGE INSTRUCTIONS**   From Dr. Maryfrances Bunnell: You were admitted for feeling short of breath  Here, we did a CT scan that showed no pneumonia  or fluid in or around the lungs It showed no pulmonary embolism (blood clot in the lungs) It showed nothing else concerning and new that could explain the shortness of breath  We also tested for a heart attack, and these tests were also normal  You got a dose of IV Lasix and your breathing got better. It may be that you had a slight heart failure flare, and this was what caused you to be worse  You may have some bronchitis, and I recommend 3 more days azithromycin  What is certain is that your anemia is making your breathing worse  You should go see Dr. Myna Hidalgo as soon as you can Keep taking your oral iron   In addition, we recommend you go see your Cardiologist within 2-3 weeks   Increase activity slowly   Complete by: As directed        Discharge Exam: Filed Weights   01/14/23 0425 01/15/23 0515 01/16/23 0527  Weight: 65.1 kg 66.2 kg 65.5 kg    General: Pt is alert, awake, not in acute distress This is a no charge note.  The patient was cleared for discharge yesterday, but due to logistical errors, transport was delayed until today.   Condition at discharge: fair  The results of significant diagnostics from this hospitalization (including imaging, microbiology, ancillary and laboratory) are listed below for reference.   Imaging Studies: ECHOCARDIOGRAM COMPLETE  Result Date: 01/14/2023    ECHOCARDIOGRAM REPORT   Patient Name:   Makayla Fisher Date of Exam: 01/14/2023 Medical Rec #:  [id number]        Height:       62.0 in Accession #:    [id number]       Weight:       143.5 lb Date of Birth:  19-Sep-1922       BSA:          1.660 m Patient Age:    89 years         BP:           155/67 mmHg Patient Gender: F                HR:           68 bpm. Exam Location:  Inpatient Procedure: 2D Echo, Color Doppler and Cardiac Doppler Indications:    I50.31 Acute  eyes 2 (two) times daily.   trimethoprim 100 MG tablet Commonly known as: TRIMPEX Take 100 mg by mouth daily.   Vitamin D3 Super Strength 50 MCG (2000 UT) Caps Generic drug: Cholecalciferol Take 4,000 Units by mouth daily.         Follow-up Information     Josph Macho, MD Follow up.   Specialty: Oncology Contact information: [address] [place] [address] [phone]         Wendall Stade, MD Follow up.   Specialty: Cardiology Contact information: [address] [place] [address] [phone]                 Discharge Instructions     Ambulatory referral to Hematology / Oncology   Complete by: As directed    Diet - low sodium heart healthy   Complete by: As directed    Discharge instructions   Complete by: As directed    **IMPORTANT DISCHARGE INSTRUCTIONS**   From Dr. Maryfrances Bunnell: You were admitted for feeling short of breath  Here, we did a CT scan that showed no pneumonia  or fluid in or around the lungs It showed no pulmonary embolism (blood clot in the lungs) It showed nothing else concerning and new that could explain the shortness of breath  We also tested for a heart attack, and these tests were also normal  You got a dose of IV Lasix and your breathing got better. It may be that you had a slight heart failure flare, and this was what caused you to be worse  You may have some bronchitis, and I recommend 3 more days azithromycin  What is certain is that your anemia is making your breathing worse  You should go see Dr. Myna Hidalgo as soon as you can Keep taking your oral iron   In addition, we recommend you go see your Cardiologist within 2-3 weeks   Increase activity slowly   Complete by: As directed        Discharge Exam: Filed Weights   01/14/23 0425 01/15/23 0515 01/16/23 0527  Weight: 65.1 kg 66.2 kg 65.5 kg    General: Pt is alert, awake, not in acute distress This is a no charge note.  The patient was cleared for discharge yesterday, but due to logistical errors, transport was delayed until today.   Condition at discharge: fair  The results of significant diagnostics from this hospitalization (including imaging, microbiology, ancillary and laboratory) are listed below for reference.   Imaging Studies: ECHOCARDIOGRAM COMPLETE  Result Date: 01/14/2023    ECHOCARDIOGRAM REPORT   Patient Name:   Makayla Fisher Date of Exam: 01/14/2023 Medical Rec #:  [id number]        Height:       62.0 in Accession #:    [id number]       Weight:       143.5 lb Date of Birth:  19-Sep-1922       BSA:          1.660 m Patient Age:    89 years         BP:           155/67 mmHg Patient Gender: F                HR:           68 bpm. Exam Location:  Inpatient Procedure: 2D Echo, Color Doppler and Cardiac Doppler Indications:    I50.31 Acute  Physician Discharge Summary   Patient: Makayla Fisher MRN: [id number] DOB: Jan 26, 1923  Admit date:     01/12/2023  Discharge date: 01/16/23  Discharge Physician: Alberteen Sam   PCP: Sunday Spillers, NP     Recommendations at discharge:  Follow up with Hematology Dr. Myna Hidalgo within 2 weeks for anemia Follow up with Cardiology Dr. Eden Emms in 2-3 weeks for mitral regurgitation and anemia and dCHF Check BMP in 1 week Complete 3 more days azithromycin Dr. Myna Hidalgo and Dr. Eden Emms: Please discuss symptomatic management with morphine, and/or Hospice if appropriate     Discharge Diagnoses: Principal Problem:   Dyspnea due to worsening anemia, valvular heart disease Active Problems:   Iron deficiency anemia due to malabsorption   Mitral regurgitation   Chronic diastolic congestive heart failure   Chronic respiratory failure with hypoxia, on home O2 therapy (HCC) - 2 L/min continuous   Hypothyroidism   Hypertension   Anxiety disorder   COPD (chronic obstructive pulmonary disease) (HCC)   CAD (coronary artery disease)   Leukocytosis   Pulmonary nodule   Myocardial injury     Hospital Course: Mrs. Bier is a 89 y.o. F with dCHF/COPD and chronic respiratory failure on 2L, NHL in remission, IDA, colostomy in place, HTN and anxiety who presented with shortness of breath at rest.  In the ER, troponin and ECG normal.  CTA chest ruled out PE, pneumonia, edema, effusion.  She was given Lasix empirically and admitted.       * Dyspnea Mitral regurgitation Anemia due to iron deficiency Multifactorial.  Likely due to anemia and mitral regurgitation.  Cardiology consulted.  She improved clinically with IV Lasix. Cardiology noted she is not a candidate for instrumentation of the MV.  They recommend optimization with diuretics, possibly switching amlodipine to ARB as an outpatient.  Recommend Cardiology follow up.  With regard to anemia, her iron indices are worsening  despite oral iron.  A blood transfusion was recommended here and she declined.  We no longer administer IV iron in the hospital, and so she was referred to Hematology for this.     Iron deficiency anemia I do not recommend endoscopy/colonoscopy given age, frailty.  Chronic respiratory failure with hypoxia, on home O2 therapy (HCC) - 2 L/min continuous Acute bronchitis Acute respiratory failure ruled out.  Had severe dyspnea at rest on admission, this improved overnight with Lasix.  Today ambulated on home 2L and >92%.  CT showed no pneumonia.  Bringing up increased sputum, discharged on 3 more days azithromycin  Patient at very high risk of readmission given her age and progressive cardiorespiratory disease.  If she is too unstable to tolerate titration of cardiac meds as an outpaitent, would strongly recommend Palliative Care and for Cardiology and Pulmonology to consider morphine  Myocardial injury Mild trop elevation due to CHF.  No signs of ishcemia, ischemia ruled out.  Pulmonary nodule Incidental finding.  - Given age and not a candidate for biopsy or therapy, recommend no follow up  COPD (chronic obstructive pulmonary disease) (HCC) Exacerbation ruled out.  CT chest rules out new airspace disease. - Continue home LAMA - Outpatient Pulm follow up, consider morphine             The [facility] Controlled Substances Registry was reviewed for this patient prior to discharge.   Consultants: Cardiology Procedures performed: Echo  Disposition: Home Diet recommendation:  Discharge Diet Orders (From admission, onward)     Start     Ordered  eyes 2 (two) times daily.   trimethoprim 100 MG tablet Commonly known as: TRIMPEX Take 100 mg by mouth daily.   Vitamin D3 Super Strength 50 MCG (2000 UT) Caps Generic drug: Cholecalciferol Take 4,000 Units by mouth daily.         Follow-up Information     Josph Macho, MD Follow up.   Specialty: Oncology Contact information: [address] [place] [address] [phone]         Wendall Stade, MD Follow up.   Specialty: Cardiology Contact information: [address] [place] [address] [phone]                 Discharge Instructions     Ambulatory referral to Hematology / Oncology   Complete by: As directed    Diet - low sodium heart healthy   Complete by: As directed    Discharge instructions   Complete by: As directed    **IMPORTANT DISCHARGE INSTRUCTIONS**   From Dr. Maryfrances Bunnell: You were admitted for feeling short of breath  Here, we did a CT scan that showed no pneumonia  or fluid in or around the lungs It showed no pulmonary embolism (blood clot in the lungs) It showed nothing else concerning and new that could explain the shortness of breath  We also tested for a heart attack, and these tests were also normal  You got a dose of IV Lasix and your breathing got better. It may be that you had a slight heart failure flare, and this was what caused you to be worse  You may have some bronchitis, and I recommend 3 more days azithromycin  What is certain is that your anemia is making your breathing worse  You should go see Dr. Myna Hidalgo as soon as you can Keep taking your oral iron   In addition, we recommend you go see your Cardiologist within 2-3 weeks   Increase activity slowly   Complete by: As directed        Discharge Exam: Filed Weights   01/14/23 0425 01/15/23 0515 01/16/23 0527  Weight: 65.1 kg 66.2 kg 65.5 kg    General: Pt is alert, awake, not in acute distress This is a no charge note.  The patient was cleared for discharge yesterday, but due to logistical errors, transport was delayed until today.   Condition at discharge: fair  The results of significant diagnostics from this hospitalization (including imaging, microbiology, ancillary and laboratory) are listed below for reference.   Imaging Studies: ECHOCARDIOGRAM COMPLETE  Result Date: 01/14/2023    ECHOCARDIOGRAM REPORT   Patient Name:   Makayla Fisher Date of Exam: 01/14/2023 Medical Rec #:  [id number]        Height:       62.0 in Accession #:    [id number]       Weight:       143.5 lb Date of Birth:  19-Sep-1922       BSA:          1.660 m Patient Age:    89 years         BP:           155/67 mmHg Patient Gender: F                HR:           68 bpm. Exam Location:  Inpatient Procedure: 2D Echo, Color Doppler and Cardiac Doppler Indications:    I50.31 Acute  Physician Discharge Summary   Patient: Makayla Fisher MRN: [id number] DOB: Jan 26, 1923  Admit date:     01/12/2023  Discharge date: 01/16/23  Discharge Physician: Alberteen Sam   PCP: Sunday Spillers, NP     Recommendations at discharge:  Follow up with Hematology Dr. Myna Hidalgo within 2 weeks for anemia Follow up with Cardiology Dr. Eden Emms in 2-3 weeks for mitral regurgitation and anemia and dCHF Check BMP in 1 week Complete 3 more days azithromycin Dr. Myna Hidalgo and Dr. Eden Emms: Please discuss symptomatic management with morphine, and/or Hospice if appropriate     Discharge Diagnoses: Principal Problem:   Dyspnea due to worsening anemia, valvular heart disease Active Problems:   Iron deficiency anemia due to malabsorption   Mitral regurgitation   Chronic diastolic congestive heart failure   Chronic respiratory failure with hypoxia, on home O2 therapy (HCC) - 2 L/min continuous   Hypothyroidism   Hypertension   Anxiety disorder   COPD (chronic obstructive pulmonary disease) (HCC)   CAD (coronary artery disease)   Leukocytosis   Pulmonary nodule   Myocardial injury     Hospital Course: Mrs. Bier is a 89 y.o. F with dCHF/COPD and chronic respiratory failure on 2L, NHL in remission, IDA, colostomy in place, HTN and anxiety who presented with shortness of breath at rest.  In the ER, troponin and ECG normal.  CTA chest ruled out PE, pneumonia, edema, effusion.  She was given Lasix empirically and admitted.       * Dyspnea Mitral regurgitation Anemia due to iron deficiency Multifactorial.  Likely due to anemia and mitral regurgitation.  Cardiology consulted.  She improved clinically with IV Lasix. Cardiology noted she is not a candidate for instrumentation of the MV.  They recommend optimization with diuretics, possibly switching amlodipine to ARB as an outpatient.  Recommend Cardiology follow up.  With regard to anemia, her iron indices are worsening  despite oral iron.  A blood transfusion was recommended here and she declined.  We no longer administer IV iron in the hospital, and so she was referred to Hematology for this.     Iron deficiency anemia I do not recommend endoscopy/colonoscopy given age, frailty.  Chronic respiratory failure with hypoxia, on home O2 therapy (HCC) - 2 L/min continuous Acute bronchitis Acute respiratory failure ruled out.  Had severe dyspnea at rest on admission, this improved overnight with Lasix.  Today ambulated on home 2L and >92%.  CT showed no pneumonia.  Bringing up increased sputum, discharged on 3 more days azithromycin  Patient at very high risk of readmission given her age and progressive cardiorespiratory disease.  If she is too unstable to tolerate titration of cardiac meds as an outpaitent, would strongly recommend Palliative Care and for Cardiology and Pulmonology to consider morphine  Myocardial injury Mild trop elevation due to CHF.  No signs of ishcemia, ischemia ruled out.  Pulmonary nodule Incidental finding.  - Given age and not a candidate for biopsy or therapy, recommend no follow up  COPD (chronic obstructive pulmonary disease) (HCC) Exacerbation ruled out.  CT chest rules out new airspace disease. - Continue home LAMA - Outpatient Pulm follow up, consider morphine             The [facility] Controlled Substances Registry was reviewed for this patient prior to discharge.   Consultants: Cardiology Procedures performed: Echo  Disposition: Home Diet recommendation:  Discharge Diet Orders (From admission, onward)     Start     Ordered  eyes 2 (two) times daily.   trimethoprim 100 MG tablet Commonly known as: TRIMPEX Take 100 mg by mouth daily.   Vitamin D3 Super Strength 50 MCG (2000 UT) Caps Generic drug: Cholecalciferol Take 4,000 Units by mouth daily.         Follow-up Information     Josph Macho, MD Follow up.   Specialty: Oncology Contact information: [address] [place] [address] [phone]         Wendall Stade, MD Follow up.   Specialty: Cardiology Contact information: [address] [place] [address] [phone]                 Discharge Instructions     Ambulatory referral to Hematology / Oncology   Complete by: As directed    Diet - low sodium heart healthy   Complete by: As directed    Discharge instructions   Complete by: As directed    **IMPORTANT DISCHARGE INSTRUCTIONS**   From Dr. Maryfrances Bunnell: You were admitted for feeling short of breath  Here, we did a CT scan that showed no pneumonia  or fluid in or around the lungs It showed no pulmonary embolism (blood clot in the lungs) It showed nothing else concerning and new that could explain the shortness of breath  We also tested for a heart attack, and these tests were also normal  You got a dose of IV Lasix and your breathing got better. It may be that you had a slight heart failure flare, and this was what caused you to be worse  You may have some bronchitis, and I recommend 3 more days azithromycin  What is certain is that your anemia is making your breathing worse  You should go see Dr. Myna Hidalgo as soon as you can Keep taking your oral iron   In addition, we recommend you go see your Cardiologist within 2-3 weeks   Increase activity slowly   Complete by: As directed        Discharge Exam: Filed Weights   01/14/23 0425 01/15/23 0515 01/16/23 0527  Weight: 65.1 kg 66.2 kg 65.5 kg    General: Pt is alert, awake, not in acute distress This is a no charge note.  The patient was cleared for discharge yesterday, but due to logistical errors, transport was delayed until today.   Condition at discharge: fair  The results of significant diagnostics from this hospitalization (including imaging, microbiology, ancillary and laboratory) are listed below for reference.   Imaging Studies: ECHOCARDIOGRAM COMPLETE  Result Date: 01/14/2023    ECHOCARDIOGRAM REPORT   Patient Name:   Makayla Fisher Date of Exam: 01/14/2023 Medical Rec #:  [id number]        Height:       62.0 in Accession #:    [id number]       Weight:       143.5 lb Date of Birth:  19-Sep-1922       BSA:          1.660 m Patient Age:    89 years         BP:           155/67 mmHg Patient Gender: F                HR:           68 bpm. Exam Location:  Inpatient Procedure: 2D Echo, Color Doppler and Cardiac Doppler Indications:    I50.31 Acute  eyes 2 (two) times daily.   trimethoprim 100 MG tablet Commonly known as: TRIMPEX Take 100 mg by mouth daily.   Vitamin D3 Super Strength 50 MCG (2000 UT) Caps Generic drug: Cholecalciferol Take 4,000 Units by mouth daily.         Follow-up Information     Josph Macho, MD Follow up.   Specialty: Oncology Contact information: [address] [place] [address] [phone]         Wendall Stade, MD Follow up.   Specialty: Cardiology Contact information: [address] [place] [address] [phone]                 Discharge Instructions     Ambulatory referral to Hematology / Oncology   Complete by: As directed    Diet - low sodium heart healthy   Complete by: As directed    Discharge instructions   Complete by: As directed    **IMPORTANT DISCHARGE INSTRUCTIONS**   From Dr. Maryfrances Bunnell: You were admitted for feeling short of breath  Here, we did a CT scan that showed no pneumonia  or fluid in or around the lungs It showed no pulmonary embolism (blood clot in the lungs) It showed nothing else concerning and new that could explain the shortness of breath  We also tested for a heart attack, and these tests were also normal  You got a dose of IV Lasix and your breathing got better. It may be that you had a slight heart failure flare, and this was what caused you to be worse  You may have some bronchitis, and I recommend 3 more days azithromycin  What is certain is that your anemia is making your breathing worse  You should go see Dr. Myna Hidalgo as soon as you can Keep taking your oral iron   In addition, we recommend you go see your Cardiologist within 2-3 weeks   Increase activity slowly   Complete by: As directed        Discharge Exam: Filed Weights   01/14/23 0425 01/15/23 0515 01/16/23 0527  Weight: 65.1 kg 66.2 kg 65.5 kg    General: Pt is alert, awake, not in acute distress This is a no charge note.  The patient was cleared for discharge yesterday, but due to logistical errors, transport was delayed until today.   Condition at discharge: fair  The results of significant diagnostics from this hospitalization (including imaging, microbiology, ancillary and laboratory) are listed below for reference.   Imaging Studies: ECHOCARDIOGRAM COMPLETE  Result Date: 01/14/2023    ECHOCARDIOGRAM REPORT   Patient Name:   Makayla Fisher Date of Exam: 01/14/2023 Medical Rec #:  [id number]        Height:       62.0 in Accession #:    [id number]       Weight:       143.5 lb Date of Birth:  19-Sep-1922       BSA:          1.660 m Patient Age:    89 years         BP:           155/67 mmHg Patient Gender: F                HR:           68 bpm. Exam Location:  Inpatient Procedure: 2D Echo, Color Doppler and Cardiac Doppler Indications:    I50.31 Acute

## 2023-01-16 NOTE — Plan of Care (Signed)
Problem: Education: Goal: Knowledge of General Education information will improve Description: Including pain rating scale, medication(s)/side effects and non-pharmacologic comfort measures 01/16/2023 1450 by Juluis Mire, RN Outcome: Adequate for Discharge 01/16/2023 1450 by Juluis Mire, RN Outcome: Adequate for Discharge   Problem: Health Behavior/Discharge Planning: Goal: Ability to manage health-related needs will improve 01/16/2023 1450 by Juluis Mire, RN Outcome: Adequate for Discharge 01/16/2023 1450 by Juluis Mire, RN Outcome: Adequate for Discharge   Problem: Clinical Measurements: Goal: Ability to maintain clinical measurements within normal limits will improve 01/16/2023 1450 by Juluis Mire, RN Outcome: Adequate for Discharge 01/16/2023 1450 by Juluis Mire, RN Outcome: Adequate for Discharge Goal: Will remain free from infection 01/16/2023 1450 by Juluis Mire, RN Outcome: Adequate for Discharge 01/16/2023 1450 by Juluis Mire, RN Outcome: Adequate for Discharge Goal: Diagnostic test results will improve 01/16/2023 1450 by Juluis Mire, RN Outcome: Adequate for Discharge 01/16/2023 1450 by Juluis Mire, RN Outcome: Adequate for Discharge Goal: Respiratory complications will improve 01/16/2023 1450 by Juluis Mire, RN Outcome: Adequate for Discharge 01/16/2023 1450 by Juluis Mire, RN Outcome: Adequate for Discharge Goal: Cardiovascular complication will be avoided 01/16/2023 1450 by Juluis Mire, RN Outcome: Adequate for Discharge 01/16/2023 1450 by Juluis Mire, RN Outcome: Adequate for Discharge   Problem: Activity: Goal: Risk for activity intolerance will decrease 01/16/2023 1450 by Juluis Mire, RN Outcome: Adequate for Discharge 01/16/2023 1450 by Juluis Mire, RN Outcome: Adequate for Discharge   Problem: Coping: Goal: Level of anxiety will  decrease 01/16/2023 1450 by Juluis Mire, RN Outcome: Adequate for Discharge 01/16/2023 1450 by Juluis Mire, RN Outcome: Adequate for Discharge   Problem: Safety: Goal: Ability to remain free from injury will improve 01/16/2023 1450 by Juluis Mire, RN Outcome: Adequate for Discharge 01/16/2023 1450 by Juluis Mire, RN Outcome: Adequate for Discharge   Problem: Skin Integrity: Goal: Risk for impaired skin integrity will decrease 01/16/2023 1450 by Juluis Mire, RN Outcome: Adequate for Discharge 01/16/2023 1450 by Juluis Mire, RN Outcome: Adequate for Discharge   Problem: Education: Goal: Ability to demonstrate management of disease process will improve 01/16/2023 1450 by Juluis Mire, RN Outcome: Adequate for Discharge 01/16/2023 1450 by Juluis Mire, RN Outcome: Adequate for Discharge Goal: Ability to verbalize understanding of medication therapies will improve 01/16/2023 1450 by Juluis Mire, RN Outcome: Adequate for Discharge 01/16/2023 1450 by Juluis Mire, RN Outcome: Adequate for Discharge Goal: Individualized Educational Video(s) 01/16/2023 1450 by Juluis Mire, RN Outcome: Adequate for Discharge 01/16/2023 1450 by Juluis Mire, RN Outcome: Adequate for Discharge   Problem: Activity: Goal: Capacity to carry out activities will improve 01/16/2023 1450 by Juluis Mire, RN Outcome: Adequate for Discharge 01/16/2023 1450 by Juluis Mire, RN Outcome: Adequate for Discharge   Problem: Cardiac: Goal: Ability to achieve and maintain adequate cardiopulmonary perfusion will improve 01/16/2023 1450 by Juluis Mire, RN Outcome: Adequate for Discharge 01/16/2023 1450 by Juluis Mire, RN Outcome: Adequate for Discharge   Problem: Education: Goal: Knowledge of disease or condition will improve 01/16/2023 1450 by Juluis Mire, RN Outcome: Adequate for  Discharge 01/16/2023 1450 by Juluis Mire, RN Outcome: Adequate for Discharge Goal: Knowledge of the prescribed therapeutic regimen will improve 01/16/2023 1450 by Juluis Mire, RN Outcome: Adequate for Discharge 01/16/2023 1450 by Juluis Mire, RN Outcome: Adequate for Discharge Goal: Individualized Educational Video(s) 01/16/2023  1450 by Juluis Mire, RN Outcome: Adequate for Discharge 01/16/2023 1450 by Juluis Mire, RN Outcome: Adequate for Discharge   Problem: Activity: Goal: Ability to tolerate increased activity will improve 01/16/2023 1450 by Juluis Mire, RN Outcome: Adequate for Discharge 01/16/2023 1450 by Juluis Mire, RN Outcome: Adequate for Discharge Goal: Will verbalize the importance of balancing activity with adequate rest periods 01/16/2023 1450 by Juluis Mire, RN Outcome: Adequate for Discharge 01/16/2023 1450 by Juluis Mire, RN Outcome: Adequate for Discharge   Problem: Respiratory: Goal: Ability to maintain a clear airway will improve 01/16/2023 1450 by Juluis Mire, RN Outcome: Adequate for Discharge 01/16/2023 1450 by Juluis Mire, RN Outcome: Adequate for Discharge Goal: Levels of oxygenation will improve 01/16/2023 1450 by Juluis Mire, RN Outcome: Adequate for Discharge 01/16/2023 1450 by Juluis Mire, RN Outcome: Adequate for Discharge Goal: Ability to maintain adequate ventilation will improve 01/16/2023 1450 by Juluis Mire, RN Outcome: Adequate for Discharge 01/16/2023 1450 by Juluis Mire, RN Outcome: Adequate for Discharge

## 2023-01-24 ENCOUNTER — Ambulatory Visit: Payer: Medicare Other | Admitting: Physician Assistant

## 2023-01-24 ENCOUNTER — Ambulatory Visit: Payer: Medicare Other | Admitting: Student

## 2023-01-31 ENCOUNTER — Inpatient Hospital Stay: Payer: Medicare Other

## 2023-01-31 ENCOUNTER — Inpatient Hospital Stay: Payer: Medicare Other | Attending: Hematology & Oncology

## 2023-01-31 ENCOUNTER — Inpatient Hospital Stay (HOSPITAL_BASED_OUTPATIENT_CLINIC_OR_DEPARTMENT_OTHER): Payer: Medicare Other | Admitting: Medical Oncology

## 2023-01-31 VITALS — BP 144/57 | HR 55 | Temp 98.3°F | Resp 20

## 2023-01-31 VITALS — BP 137/41 | HR 56 | Temp 98.3°F | Resp 19

## 2023-01-31 DIAGNOSIS — D509 Iron deficiency anemia, unspecified: Secondary | ICD-10-CM | POA: Diagnosis present

## 2023-01-31 DIAGNOSIS — D500 Iron deficiency anemia secondary to blood loss (chronic): Secondary | ICD-10-CM

## 2023-01-31 DIAGNOSIS — C8203 Follicular lymphoma grade I, intra-abdominal lymph nodes: Secondary | ICD-10-CM

## 2023-01-31 DIAGNOSIS — Z79899 Other long term (current) drug therapy: Secondary | ICD-10-CM | POA: Diagnosis not present

## 2023-01-31 DIAGNOSIS — Z8572 Personal history of non-Hodgkin lymphomas: Secondary | ICD-10-CM | POA: Diagnosis present

## 2023-01-31 DIAGNOSIS — D649 Anemia, unspecified: Secondary | ICD-10-CM | POA: Diagnosis not present

## 2023-01-31 DIAGNOSIS — Z95828 Presence of other vascular implants and grafts: Secondary | ICD-10-CM

## 2023-01-31 DIAGNOSIS — Z7982 Long term (current) use of aspirin: Secondary | ICD-10-CM | POA: Insufficient documentation

## 2023-01-31 DIAGNOSIS — K909 Intestinal malabsorption, unspecified: Secondary | ICD-10-CM | POA: Diagnosis not present

## 2023-01-31 LAB — CBC WITH DIFFERENTIAL (CANCER CENTER ONLY)
Abs Immature Granulocytes: 0.1 10*3/uL — ABNORMAL HIGH (ref 0.00–0.07)
Basophils Absolute: 0 10*3/uL (ref 0.0–0.1)
Basophils Relative: 1 %
Eosinophils Absolute: 0.2 10*3/uL (ref 0.0–0.5)
Eosinophils Relative: 3 %
HCT: 24.2 % — ABNORMAL LOW (ref 36.0–46.0)
Hemoglobin: 7.3 g/dL — ABNORMAL LOW (ref 12.0–15.0)
Immature Granulocytes: 1 %
Lymphocytes Relative: 23 %
Lymphs Abs: 1.8 10*3/uL (ref 0.7–4.0)
MCH: 26.4 pg (ref 26.0–34.0)
MCHC: 30.2 g/dL (ref 30.0–36.0)
MCV: 87.7 fL (ref 80.0–100.0)
Monocytes Absolute: 0.8 10*3/uL (ref 0.1–1.0)
Monocytes Relative: 10 %
Neutro Abs: 4.7 10*3/uL (ref 1.7–7.7)
Neutrophils Relative %: 62 %
Platelet Count: 196 10*3/uL (ref 150–400)
RBC: 2.76 MIL/uL — ABNORMAL LOW (ref 3.87–5.11)
RDW: 16.9 % — ABNORMAL HIGH (ref 11.5–15.5)
WBC Count: 7.6 10*3/uL (ref 4.0–10.5)
nRBC: 0 % (ref 0.0–0.2)

## 2023-01-31 LAB — FERRITIN: Ferritin: 14 ng/mL (ref 11–307)

## 2023-01-31 LAB — CMP (CANCER CENTER ONLY)
ALT: 9 U/L (ref 0–44)
AST: 18 U/L (ref 15–41)
Albumin: 3.4 g/dL — ABNORMAL LOW (ref 3.5–5.0)
Alkaline Phosphatase: 60 U/L (ref 38–126)
Anion gap: 7 (ref 5–15)
BUN: 24 mg/dL — ABNORMAL HIGH (ref 8–23)
CO2: 35 mmol/L — ABNORMAL HIGH (ref 22–32)
Calcium: 9.4 mg/dL (ref 8.9–10.3)
Chloride: 99 mmol/L (ref 98–111)
Creatinine: 1.12 mg/dL — ABNORMAL HIGH (ref 0.44–1.00)
GFR, Estimated: 45 mL/min — ABNORMAL LOW (ref >60)
Glucose, Bld: 94 mg/dL (ref 70–99)
Potassium: 3.9 mmol/L (ref 3.5–5.1)
Sodium: 141 mmol/L (ref 135–145)
Total Bilirubin: 0.3 mg/dL (ref 0.3–1.2)
Total Protein: 5.6 g/dL — ABNORMAL LOW (ref 6.5–8.1)

## 2023-01-31 LAB — LACTATE DEHYDROGENASE: LDH: 158 U/L (ref 98–192)

## 2023-01-31 LAB — IRON AND IRON BINDING CAPACITY (CC-WL,HP ONLY)
Iron: 31 ug/dL (ref 28–170)
Saturation Ratios: 9 % — ABNORMAL LOW (ref 10.4–31.8)
TIBC: 361 ug/dL (ref 250–450)
UIBC: 330 ug/dL (ref 148–442)

## 2023-01-31 LAB — RETIC PANEL
Immature Retic Fract: 32 % — ABNORMAL HIGH (ref 2.3–15.9)
RBC.: 2.76 MIL/uL — ABNORMAL LOW (ref 3.87–5.11)
Retic Count, Absolute: 64.6 10*3/uL (ref 19.0–186.0)
Retic Ct Pct: 2.3 % (ref 0.4–3.1)
Reticulocyte Hemoglobin: 25 pg — ABNORMAL LOW (ref >27.9)

## 2023-01-31 MED ORDER — SODIUM CHLORIDE 0.9 % IV SOLN: Freq: Once | INTRAVENOUS | Status: AC

## 2023-01-31 MED ORDER — SODIUM CHLORIDE 0.9% FLUSH
10.0000 mL | Freq: Once | INTRAVENOUS | Status: AC
  Administered 2023-01-31: 10 mL via INTRAVENOUS

## 2023-01-31 MED ORDER — HEPARIN SOD (PORK) LOCK FLUSH 100 UNIT/ML IV SOLN
500.0000 [IU] | Freq: Once | INTRAVENOUS | Status: AC
  Administered 2023-01-31: 500 [IU] via INTRAVENOUS

## 2023-01-31 MED ORDER — SODIUM CHLORIDE 0.9 % IV SOLN
510.0000 mg | Freq: Once | INTRAVENOUS | Status: AC
  Administered 2023-01-31: 510 mg via INTRAVENOUS
  Filled 2023-01-31: qty 17

## 2023-01-31 NOTE — Patient Instructions (Signed)
 Ferumoxytol Injection What is this medication? FERUMOXYTOL (FER ue MOX i tol) treats low levels of iron in your body (iron deficiency anemia). Iron is a mineral that plays an important role in making red blood cells, which carry oxygen from your lungs to the rest of your body. This medicine may be used for other purposes; ask your health care provider or pharmacist if you have questions. COMMON BRAND NAME(S): Feraheme What should I tell my care team before I take this medication? They need to know if you have any of these conditions: Anemia not caused by low iron levels High levels of iron in the blood Magnetic resonance imaging (MRI) test scheduled An unusual or allergic reaction to iron, other medications, foods, dyes, or preservatives Pregnant or trying to get pregnant Breastfeeding How should I use this medication? This medication is injected into a vein. It is given by your care team in a hospital or clinic setting. Talk to your care team the use of this medication in children. Special care may be needed. Overdosage: If you think you have taken too much of this medicine contact a poison control center or emergency room at once. NOTE: This medicine is only for you. Do not share this medicine with others. What if I miss a dose? It is important not to miss your dose. Call your care team if you are unable to keep an appointment. What may interact with this medication? Other iron products This list may not describe all possible interactions. Give your health care provider a list of all the medicines, herbs, non-prescription drugs, or dietary supplements you use. Also tell them if you smoke, drink alcohol, or use illegal drugs. Some items may interact with your medicine. What should I watch for while using this medication? Visit your care team regularly. Tell your care team if your symptoms do not start to get better or if they get worse. You may need blood work done while you are taking this  medication. You may need to follow a special diet. Talk to your care team. Foods that contain iron include: whole grains/cereals, dried fruits, beans, or peas, leafy green vegetables, and organ meats (liver, kidney). What side effects may I notice from receiving this medication? Side effects that you should report to your care team as soon as possible: Allergic reactions--skin rash, itching, hives, swelling of the face, lips, tongue, or throat Low blood pressure--dizziness, feeling faint or lightheaded, blurry vision Shortness of breath Side effects that usually do not require medical attention (report to your care team if they continue or are bothersome): Flushing Headache Joint pain Muscle pain Nausea Pain, redness, or irritation at injection site This list may not describe all possible side effects. Call your doctor for medical advice about side effects. You may report side effects to FDA at 1-800-FDA-1088. Where should I keep my medication? This medication is given in a hospital or clinic. It will not be stored at home. NOTE: This sheet is a summary. It may not cover all possible information. If you have questions about this medicine, talk to your doctor, pharmacist, or health care provider.  2024 Elsevier/Gold Standard (2022-09-16 00:00:00)

## 2023-01-31 NOTE — Progress Notes (Signed)
Hematology and Oncology Follow Up Visit  Makayla Fisher [id number] [birth year] 89 y.o. 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 is back for followup. We last saw her about 6 months ago. We currently also follow her for some IDA which is thought to be secondary to malabsorption.   Since her last visit she was hospitalized from 09/19-09/23 for dyspnea, anemia, CHF.  She was found to have a Hgb of 7.7 with MCV of 88.7. Platelet count was 154, ferritin of 25, iron saturation of 7%. She was offered a blood transfusion but declined. Per hospitalist they do not offer IV iron inpatient so it was suggested she be seen for follow up with our office instead.   She denies lymphadenopathy, chest pain, fevers, night sweats. Weight is down slightly   Overall, I would say performance status is probably ECOG 2.    Wt Readings from Last 3 Encounters:  01/16/23 144 lb 6.4 oz (65.5 kg)  08/29/22 148 lb (67.1 kg)  03/10/22 145 lb (65.8 kg)    Medications:  Current Outpatient Medications:    ALPRAZolam (XANAX) 0.5 MG tablet, Take 1 tablet (0.5 mg total) by mouth 3 (three) times daily as needed for anxiety., Disp: 16 tablet, Rfl: 0   amLODipine (NORVASC) 5 MG tablet, Take 1 tablet (5 mg total) by mouth daily., Disp: 30 tablet, Rfl: 2   ARTIFICIAL TEAR OP, Place 1 drop into both eyes as needed (dry eyes)., Disp: , Rfl:    aspirin 81 MG EC tablet, Take 1 tablet (81 mg total) by mouth daily. Swallow whole., Disp: 30 tablet, Rfl: 11   atorvastatin (LIPITOR) 40 MG tablet, Take 1 tablet (40 mg total) by mouth at bedtime., Disp: 30 tablet, Rfl: 30   azithromycin (ZITHROMAX) 250 MG tablet, Take 1 tablet (250 mg total) by mouth daily., Disp: 3 each, Rfl: 0   Calcium  Carb-Cholecalciferol (CALCIUM 600+D3 PO), Take 1 tablet by mouth at bedtime., Disp: , Rfl:    Cholecalciferol (VITAMIN D3 SUPER STRENGTH) 50 MCG (2000 UT) CAPS, Take 4,000 Units by mouth daily. , Disp: , Rfl:    Cranberry 425 MG CAPS, Take 425 mg by mouth daily. , Disp: , Rfl:    docusate sodium (COLACE) 100 MG capsule, Take 100 mg by mouth daily as needed for mild constipation., Disp: , Rfl:    FERREX 150 150 MG capsule, Take by mouth 2 (two) times daily., Disp: , Rfl:    furosemide (LASIX) 20 MG tablet, Take 1 tablet (20 mg total) by mouth daily., Disp: 30 tablet, Rfl: 3   hydrOXYzine (ATARAX) 50 MG tablet, SMARTSIG:1 Tablet(s) By Mouth Every 12 Hours PRN, Disp: , Rfl:    ipratropium-albuterol (DUONEB) 0.5-2.5 (3) MG/3ML SOLN, Take 3 mLs by nebulization every 12 (twelve) hours., Disp: 360 mL, Rfl: 1   levothyroxine (SYNTHROID) 25 MCG tablet, Take 25 mcg by mouth daily before breakfast., Disp: , Rfl:    lidocaine-prilocaine (EMLA) cream, Apply 1 application topically See admin instructions. Apply to port-a-cath one hour prior to procedure, Disp: , Rfl:    MUCUS RELIEF 600 MG 12 hr tablet, Take by mouth., Disp: , Rfl:    Multiple Vitamin (DAILY VITE PO), Take 1 tablet by mouth daily., Disp: , Rfl:    Multiple Vitamins-Minerals (  PRESERVISION AREDS PO), Take 1 tablet by mouth in the morning and at bedtime., Disp: , Rfl:    nystatin cream (MYCOSTATIN), Apply 1 application topically every 12 (twelve) hours as needed (to rash between the legs and groin)., Disp: , Rfl:    OXYGEN, Inhale 2 L into the lungs continuous., Disp: , Rfl:    psyllium (METAMUCIL) 58.6 % packet, Take 1 packet by mouth every Monday, Wednesday, and Friday. ORANGE, Disp: , Rfl:    rOPINIRole (REQUIP) 1 MG tablet, Take 1 mg by mouth at bedtime., Disp: , Rfl:    sertraline (ZOLOFT) 25 MG tablet, Take 25 mg by mouth daily., Disp: , Rfl:    timolol (TIMOPTIC) 0.5 % ophthalmic solution, Place 1 drop into both eyes 2 (two) times daily.,  Disp: , Rfl:    trimethoprim (TRIMPEX) 100 MG tablet, Take 100 mg by mouth daily., Disp: , Rfl:    umeclidinium bromide (INCRUSE ELLIPTA) 62.5 MCG/ACT AEPB, Inhale 1 puff into the lungs daily., Disp: 30 each, Rfl: 11 No current facility-administered medications for this visit.  Facility-Administered Medications Ordered in Other Visits:    heparin lock flush 100 unit/mL, 500 Units, Intravenous, Once, Ennever, Rose Phi, MD  Allergies:  Allergies  Allergen Reactions   Epipen [Epinephrine Hcl (Nasal)] Palpitations   Levaquin [Levofloxacin In D5w] Other (See Comments)    Weakness- "Allergic," per MAR   Beta Adrenergic Blockers Other (See Comments)    intolerant to higher doses of BB due to bradycardia.   Chlorhexidine Gluconate Other (See Comments)    Pt declined use and suspects she may have an intolerance   Heparin Other (See Comments)    "Allergic," pre MAR- Maybe affected platelets, per patient Pt has tolerated heparin flush many times since 2015   Sulfa Antibiotics Rash    Past Medical History, Surgical history, Social history, and Family History were reviewed and updated.  Review of Systems: Review of Systems  Constitutional: Negative.   HENT: Negative.    Eyes: Negative.   Respiratory:  Positive for shortness of breath.   Cardiovascular:  Positive for palpitations.  Gastrointestinal:  Positive for diarrhea and nausea.  Genitourinary: Negative.   Musculoskeletal:  Positive for joint pain and myalgias.  Skin: Negative.   Neurological:  Positive for tingling.  Endo/Heme/Allergies: Negative.   Psychiatric/Behavioral: Negative.       Physical Exam:  oral temperature is 98.3 F (36.8 C). Her blood pressure is 144/57 (abnormal) and her pulse is 55 (abnormal). Her respiration is 20 and oxygen saturation is 97%.   Physical Exam Vitals reviewed.  Constitutional:      Comments: Ambulating on her own with rolling walker- has on portable oxygen via nasal canula.   HENT:      Head: Normocephalic and atraumatic.  Eyes:     Pupils: Pupils are equal, round, and reactive to light.  Cardiovascular:     Rate and Rhythm: Normal rate and regular rhythm.     Heart sounds: Normal heart sounds.  Pulmonary:     Effort: Pulmonary effort is normal.     Breath sounds: Normal breath sounds.  Abdominal:     General: Bowel sounds are normal.     Palpations: Abdomen is soft.  Musculoskeletal:        General: No tenderness or deformity. Normal range of motion.     Cervical back: Normal range of motion.  Lymphadenopathy:     Cervical: No cervical adenopathy.  Skin:    General: Skin is warm and dry.  Findings: No erythema or rash.  Neurological:     Mental Status: She is alert and oriented to person, place, and time.  Psychiatric:        Behavior: Behavior normal.        Thought Content: Thought content normal.        Judgment: Judgment normal.      Lab Results  Component Value Date   WBC 7.6 01/31/2023   HGB 7.3 (L) 01/31/2023   HCT 24.2 (L) 01/31/2023   MCV 87.7 01/31/2023   PLT 196 01/31/2023     Chemistry      Component Value Date/Time   NA 139 01/14/2023 0500   NA 143 01/31/2017 0931   NA 141 05/08/2014 1041   K 3.4 (L) 01/14/2023 0500   K 3.7 01/31/2017 0931   K 4.5 05/08/2014 1041   CL 99 01/14/2023 0500   CL 106 01/31/2017 0931   CO2 30 01/14/2023 0500   CO2 27 01/31/2017 0931   CO2 24 05/08/2014 1041   BUN 18 01/14/2023 0500   BUN 10 01/31/2017 0931   BUN 14.2 05/08/2014 1041   CREATININE 1.30 (H) 01/14/2023 0500   CREATININE 0.99 08/29/2022 1338   CREATININE 0.9 01/31/2017 0931   CREATININE 0.8 05/08/2014 1041      Component Value Date/Time   CALCIUM 8.6 (L) 01/14/2023 0500   CALCIUM 9.7 01/31/2017 0931   CALCIUM 9.2 05/08/2014 1041   ALKPHOS 55 01/12/2023 1733   ALKPHOS 57 01/31/2017 0931   ALKPHOS 66 05/08/2014 1041   AST 19 01/12/2023 1733   AST 25 08/29/2022 1338   AST 23 05/08/2014 1041   ALT 11 01/12/2023 1733   ALT 15  08/29/2022 1338   ALT 14 01/31/2017 0931   ALT 10 05/08/2014 1041   BILITOT 0.5 01/12/2023 1733   BILITOT 0.4 08/29/2022 1338   BILITOT 0.42 05/08/2014 1041      Impression and Plan: Ms. Gatzke is 89 year old female. She has a follicular large cell lymphoma.  It has been over 5 years since she had completion of her treatment with respect to maintenance Rituxan. She is suspected to be cured by Dr. Rexene Edison.   Today she has no sign of recurrence of her lymphoma. Our larger focus for today's visit is on her IDA. Her Hgb has fallen from 7.7 to 7.3 over the course of 2 weeks. No known active bleeding.   We will treat her IDA with IV Feraheme weekly x 2. She declines a blood transfusion at this time.   Disposition: Feraheme added on for today RTC 2 weeks APP, port labs (CBC w/, CMP, ferritin, iron, LDH, retics, sample to blood bank)-[place] Feraheme next week  RTC every 2 months for port flushes x 6 months -[place]  Rushie Chestnut, PA-C 10/8/202411:31 AM

## 2023-01-31 NOTE — Patient Instructions (Signed)

## 2023-02-01 ENCOUNTER — Other Ambulatory Visit: Payer: Self-pay | Admitting: Medical Oncology

## 2023-02-07 ENCOUNTER — Inpatient Hospital Stay: Payer: Medicare Other

## 2023-02-07 VITALS — BP 174/57 | HR 49 | Resp 20

## 2023-02-07 DIAGNOSIS — D500 Iron deficiency anemia secondary to blood loss (chronic): Secondary | ICD-10-CM

## 2023-02-07 DIAGNOSIS — D509 Iron deficiency anemia, unspecified: Secondary | ICD-10-CM | POA: Diagnosis not present

## 2023-02-07 MED ORDER — SODIUM CHLORIDE 0.9 % IV SOLN
510.0000 mg | Freq: Once | INTRAVENOUS | Status: AC
  Administered 2023-02-07: 510 mg via INTRAVENOUS
  Filled 2023-02-07: qty 17

## 2023-02-07 MED ORDER — HEPARIN SOD (PORK) LOCK FLUSH 100 UNIT/ML IV SOLN
250.0000 [IU] | Freq: Once | INTRAVENOUS | Status: AC | PRN
  Administered 2023-02-07: 500 [IU]

## 2023-02-07 MED ORDER — SODIUM CHLORIDE 0.9 % IJ SOLN: 3.0000 mL | Freq: Once | INTRAMUSCULAR | Status: DC | PRN

## 2023-02-07 MED ORDER — SODIUM CHLORIDE 0.9 % IV SOLN: Freq: Once | INTRAVENOUS | Status: AC

## 2023-02-07 MED ORDER — SODIUM CHLORIDE 0.9% FLUSH
10.0000 mL | Freq: Once | INTRAVENOUS | Status: AC
  Administered 2023-02-07: 10 mL via INTRAVENOUS

## 2023-02-07 NOTE — Patient Instructions (Signed)
 Ferumoxytol Injection What is this medication? FERUMOXYTOL (FER ue MOX i tol) treats low levels of iron in your body (iron deficiency anemia). Iron is a mineral that plays an important role in making red blood cells, which carry oxygen from your lungs to the rest of your body. This medicine may be used for other purposes; ask your health care provider or pharmacist if you have questions. COMMON BRAND NAME(S): Feraheme What should I tell my care team before I take this medication? They need to know if you have any of these conditions: Anemia not caused by low iron levels High levels of iron in the blood Magnetic resonance imaging (MRI) test scheduled An unusual or allergic reaction to iron, other medications, foods, dyes, or preservatives Pregnant or trying to get pregnant Breastfeeding How should I use this medication? This medication is injected into a vein. It is given by your care team in a hospital or clinic setting. Talk to your care team the use of this medication in children. Special care may be needed. Overdosage: If you think you have taken too much of this medicine contact a poison control center or emergency room at once. NOTE: This medicine is only for you. Do not share this medicine with others. What if I miss a dose? It is important not to miss your dose. Call your care team if you are unable to keep an appointment. What may interact with this medication? Other iron products This list may not describe all possible interactions. Give your health care provider a list of all the medicines, herbs, non-prescription drugs, or dietary supplements you use. Also tell them if you smoke, drink alcohol, or use illegal drugs. Some items may interact with your medicine. What should I watch for while using this medication? Visit your care team regularly. Tell your care team if your symptoms do not start to get better or if they get worse. You may need blood work done while you are taking this  medication. You may need to follow a special diet. Talk to your care team. Foods that contain iron include: whole grains/cereals, dried fruits, beans, or peas, leafy green vegetables, and organ meats (liver, kidney). What side effects may I notice from receiving this medication? Side effects that you should report to your care team as soon as possible: Allergic reactions--skin rash, itching, hives, swelling of the face, lips, tongue, or throat Low blood pressure--dizziness, feeling faint or lightheaded, blurry vision Shortness of breath Side effects that usually do not require medical attention (report to your care team if they continue or are bothersome): Flushing Headache Joint pain Muscle pain Nausea Pain, redness, or irritation at injection site This list may not describe all possible side effects. Call your doctor for medical advice about side effects. You may report side effects to FDA at 1-800-FDA-1088. Where should I keep my medication? This medication is given in a hospital or clinic. It will not be stored at home. NOTE: This sheet is a summary. It may not cover all possible information. If you have questions about this medicine, talk to your doctor, pharmacist, or health care provider.  2024 Elsevier/Gold Standard (2022-09-16 00:00:00)

## 2023-02-14 ENCOUNTER — Inpatient Hospital Stay: Payer: Medicare Other

## 2023-02-14 ENCOUNTER — Ambulatory Visit: Payer: Medicare Other

## 2023-02-14 ENCOUNTER — Inpatient Hospital Stay (HOSPITAL_BASED_OUTPATIENT_CLINIC_OR_DEPARTMENT_OTHER): Payer: Medicare Other | Admitting: Medical Oncology

## 2023-02-14 ENCOUNTER — Encounter: Payer: Self-pay | Admitting: Medical Oncology

## 2023-02-14 VITALS — BP 168/59 | HR 62 | Temp 97.6°F | Resp 18

## 2023-02-14 DIAGNOSIS — D509 Iron deficiency anemia, unspecified: Secondary | ICD-10-CM | POA: Diagnosis not present

## 2023-02-14 DIAGNOSIS — D500 Iron deficiency anemia secondary to blood loss (chronic): Secondary | ICD-10-CM

## 2023-02-14 DIAGNOSIS — D649 Anemia, unspecified: Secondary | ICD-10-CM

## 2023-02-14 DIAGNOSIS — Z95828 Presence of other vascular implants and grafts: Secondary | ICD-10-CM

## 2023-02-14 LAB — CBC WITH DIFFERENTIAL (CANCER CENTER ONLY)
Abs Immature Granulocytes: 0.02 10*3/uL (ref 0.00–0.07)
Basophils Absolute: 0.1 10*3/uL (ref 0.0–0.1)
Basophils Relative: 1 %
Eosinophils Absolute: 0.2 10*3/uL (ref 0.0–0.5)
Eosinophils Relative: 3 %
HCT: 31.6 % — ABNORMAL LOW (ref 36.0–46.0)
Hemoglobin: 9.6 g/dL — ABNORMAL LOW (ref 12.0–15.0)
Immature Granulocytes: 0 %
Lymphocytes Relative: 28 %
Lymphs Abs: 1.8 10*3/uL (ref 0.7–4.0)
MCH: 28.8 pg (ref 26.0–34.0)
MCHC: 30.4 g/dL (ref 30.0–36.0)
MCV: 94.9 fL (ref 80.0–100.0)
Monocytes Absolute: 0.8 10*3/uL (ref 0.1–1.0)
Monocytes Relative: 12 %
Neutro Abs: 3.7 10*3/uL (ref 1.7–7.7)
Neutrophils Relative %: 56 %
Platelet Count: 163 10*3/uL (ref 150–400)
RBC: 3.33 MIL/uL — ABNORMAL LOW (ref 3.87–5.11)
RDW: 22.6 % — ABNORMAL HIGH (ref 11.5–15.5)
WBC Count: 6.5 10*3/uL (ref 4.0–10.5)
nRBC: 0 % (ref 0.0–0.2)

## 2023-02-14 LAB — CMP (CANCER CENTER ONLY)
ALT: 11 U/L (ref 0–44)
AST: 24 U/L (ref 15–41)
Albumin: 4 g/dL (ref 3.5–5.0)
Alkaline Phosphatase: 59 U/L (ref 38–126)
Anion gap: 9 (ref 5–15)
BUN: 16 mg/dL (ref 8–23)
CO2: 30 mmol/L (ref 22–32)
Calcium: 9.1 mg/dL (ref 8.9–10.3)
Chloride: 103 mmol/L (ref 98–111)
Creatinine: 1.1 mg/dL — ABNORMAL HIGH (ref 0.44–1.00)
GFR, Estimated: 46 mL/min — ABNORMAL LOW (ref >60)
Glucose, Bld: 105 mg/dL — ABNORMAL HIGH (ref 70–99)
Potassium: 3.7 mmol/L (ref 3.5–5.1)
Sodium: 142 mmol/L (ref 135–145)
Total Bilirubin: 0.4 mg/dL (ref 0.3–1.2)
Total Protein: 5.6 g/dL — ABNORMAL LOW (ref 6.5–8.1)

## 2023-02-14 LAB — RETICULOCYTES
Immature Retic Fract: 27.2 % — ABNORMAL HIGH (ref 2.3–15.9)
RBC.: 3.34 MIL/uL — ABNORMAL LOW (ref 3.87–5.11)
Retic Count, Absolute: 143.3 10*3/uL (ref 19.0–186.0)
Retic Ct Pct: 4.3 % — ABNORMAL HIGH (ref 0.4–3.1)

## 2023-02-14 MED ORDER — SODIUM CHLORIDE 0.9% FLUSH
10.0000 mL | Freq: Once | INTRAVENOUS | Status: AC
  Administered 2023-02-14: 10 mL

## 2023-02-14 MED ORDER — HEPARIN SOD (PORK) LOCK FLUSH 100 UNIT/ML IV SOLN
500.0000 [IU] | Freq: Once | INTRAVENOUS | Status: AC
  Administered 2023-02-14: 500 [IU] via INTRAVENOUS

## 2023-02-14 NOTE — Progress Notes (Signed)
Hematology and Oncology Follow Up Visit  Makayla Fisher [id number] [birth year] 89 y.o. 02/14/2023   Principle Diagnosis:  Follicular large cell non-Hodgkin's lymphoma Iron deficiency anemia secondary to malabsorption  Current Therapy:   Status post cycle #8 of maintenance Rituxan - complete in June 2018 IV iron-Feraheme as indicated     Interim History:  [name] is back for followup. We last saw her about 6 months ago. We currently also follow her for some IDA which is thought to be secondary to malabsorption.   She has been doing well and feeling better with more energy. She has tolerated the IV Feraheme well without side effects.   There has been no bleeding to her knowledge: denies epistaxis, gingivitis, hemoptysis, hematemesis, hematuria, melena, excessive bruising, blood donation.   She denies lymphadenopathy, chest pain, fevers, night sweats. Weight is down slightly which she attributes to not eating as much as normal.  Overall, I would say performance status is probably ECOG 2.    Wt Readings from Last 3 Encounters:  01/16/23 144 lb 6.4 oz (65.5 kg)  08/29/22 148 lb (67.1 kg)  03/10/22 145 lb (65.8 kg)    Medications:  Current Outpatient Medications:    ALPRAZolam (XANAX) 0.5 MG tablet, Take 1 tablet (0.5 mg total) by mouth 3 (three) times daily as needed for anxiety., Disp: 16 tablet, Rfl: 0   amLODipine (NORVASC) 5 MG tablet, Take 1 tablet (5 mg total) by mouth daily., Disp: 30 tablet, Rfl: 2   ARTIFICIAL TEAR OP, Place 1 drop into both eyes as needed (dry eyes)., Disp: , Rfl:    aspirin 81 MG EC tablet, Take 1 tablet (81 mg total) by mouth daily. Swallow whole., Disp: 30 tablet, Rfl: 11   atorvastatin (LIPITOR) 40 MG tablet, Take 1 tablet (40 mg total) by mouth at bedtime., Disp: 30 tablet, Rfl: 30   azithromycin (ZITHROMAX) 250 MG tablet, Take 1 tablet (250 mg total) by mouth daily., Disp: 3 each, Rfl: 0   Calcium Carb-Cholecalciferol (CALCIUM 600+D3 PO),  Take 1 tablet by mouth at bedtime., Disp: , Rfl:    Cholecalciferol (VITAMIN D3 SUPER STRENGTH) 50 MCG (2000 UT) CAPS, Take 4,000 Units by mouth daily. , Disp: , Rfl:    Cranberry 425 MG CAPS, Take 425 mg by mouth daily. , Disp: , Rfl:    docusate sodium (COLACE) 100 MG capsule, Take 100 mg by mouth daily as needed for mild constipation., Disp: , Rfl:    FERREX 150 150 MG capsule, Take by mouth 2 (two) times daily., Disp: , Rfl:    furosemide (LASIX) 20 MG tablet, Take 1 tablet (20 mg total) by mouth daily., Disp: 30 tablet, Rfl: 3   hydrOXYzine (ATARAX) 50 MG tablet, SMARTSIG:1 Tablet(s) By Mouth Every 12 Hours PRN, Disp: , Rfl:    ipratropium-albuterol (DUONEB) 0.5-2.5 (3) MG/3ML SOLN, Take 3 mLs by nebulization every 12 (twelve) hours., Disp: 360 mL, Rfl: 1   levothyroxine (SYNTHROID) 25 MCG tablet, Take 25 mcg by mouth daily before breakfast., Disp: , Rfl:    lidocaine-prilocaine (EMLA) cream, Apply 1 application topically See admin instructions. Apply to port-a-cath one hour prior to procedure, Disp: , Rfl:    MUCUS RELIEF 600 MG 12 hr tablet, Take by mouth., Disp: , Rfl:    Multiple Vitamin (DAILY VITE PO), Take 1 tablet by mouth daily., Disp: , Rfl:    Multiple Vitamins-Minerals (PRESERVISION AREDS PO), Take 1 tablet by mouth in the morning and at bedtime., Disp: , Rfl:  nystatin cream (MYCOSTATIN), Apply 1 application topically every 12 (twelve) hours as needed (to rash between the legs and groin)., Disp: , Rfl:    OXYGEN, Inhale 2 L into the lungs continuous., Disp: , Rfl:    psyllium (METAMUCIL) 58.6 % packet, Take 1 packet by mouth every Monday, Wednesday, and Friday. ORANGE, Disp: , Rfl:    rOPINIRole (REQUIP) 1 MG tablet, Take 1 mg by mouth at bedtime., Disp: , Rfl:    sertraline (ZOLOFT) 25 MG tablet, Take 25 mg by mouth daily., Disp: , Rfl:    timolol (TIMOPTIC) 0.5 % ophthalmic solution, Place 1 drop into both eyes 2 (two) times daily., Disp: , Rfl:    trimethoprim (TRIMPEX) 100  MG tablet, Take 100 mg by mouth daily., Disp: , Rfl:    umeclidinium bromide (INCRUSE ELLIPTA) 62.5 MCG/ACT AEPB, Inhale 1 puff into the lungs daily., Disp: 30 each, Rfl: 11  Allergies:  Allergies  Allergen Reactions   Epipen [Epinephrine Hcl (Nasal)] Palpitations   Levaquin [Levofloxacin In D5w] Other (See Comments)    Weakness- "Allergic," per MAR   Beta Adrenergic Blockers Other (See Comments)    intolerant to higher doses of BB due to bradycardia.   Chlorhexidine Gluconate Other (See Comments)    Pt declined use and suspects she may have an intolerance   Heparin Other (See Comments)    "Allergic," pre MAR- Maybe affected platelets, per patient Pt has tolerated heparin flush many times since 2015   Sulfa Antibiotics Rash    Past Medical History, Surgical history, Social history, and Family History were reviewed and updated.  Review of Systems: Review of Systems  Constitutional: Negative.   HENT: Negative.    Eyes: Negative.   Respiratory:  Negative for shortness of breath.   Cardiovascular:  Negative for palpitations.  Gastrointestinal:  Negative for diarrhea and nausea.  Genitourinary: Negative.   Musculoskeletal:  Positive for joint pain and myalgias.  Skin: Negative.   Neurological:  Positive for tingling.  Endo/Heme/Allergies: Negative.   Psychiatric/Behavioral: Negative.       Physical Exam:  oral temperature is 97.6 F (36.4 C). Her blood pressure is 168/59 (abnormal) and her pulse is 62. Her respiration is 18 and oxygen saturation is 100%.   Physical Exam Vitals reviewed.  Constitutional:      Comments: Ambulating on her own with rolling walker- has on portable oxygen via nasal canula.   HENT:     Head: Normocephalic and atraumatic.  Eyes:     Pupils: Pupils are equal, round, and reactive to light.  Cardiovascular:     Rate and Rhythm: Normal rate and regular rhythm.     Heart sounds: Normal heart sounds.  Pulmonary:     Effort: Pulmonary effort is  normal.     Breath sounds: Normal breath sounds.  Abdominal:     General: Bowel sounds are normal.     Palpations: Abdomen is soft.  Musculoskeletal:        General: No tenderness or deformity. Normal range of motion.     Cervical back: Normal range of motion.  Lymphadenopathy:     Cervical: No cervical adenopathy.  Skin:    General: Skin is warm and dry.     Findings: No erythema or rash.  Neurological:     Mental Status: She is alert and oriented to person, place, and time.  Psychiatric:        Behavior: Behavior normal.        Thought Content: Thought content normal.  Judgment: Judgment normal.      Lab Results  Component Value Date   WBC 6.5 02/14/2023   HGB 9.6 (L) 02/14/2023   HCT 31.6 (L) 02/14/2023   MCV 94.9 02/14/2023   PLT 163 02/14/2023     Chemistry      Component Value Date/Time   NA 142 02/14/2023 1241   NA 143 01/31/2017 0931   NA 141 05/08/2014 1041   K 3.7 02/14/2023 1241   K 3.7 01/31/2017 0931   K 4.5 05/08/2014 1041   CL 103 02/14/2023 1241   CL 106 01/31/2017 0931   CO2 30 02/14/2023 1241   CO2 27 01/31/2017 0931   CO2 24 05/08/2014 1041   BUN 16 02/14/2023 1241   BUN 10 01/31/2017 0931   BUN 14.2 05/08/2014 1041   CREATININE 1.10 (H) 02/14/2023 1241   CREATININE 0.9 01/31/2017 0931   CREATININE 0.8 05/08/2014 1041      Component Value Date/Time   CALCIUM 9.1 02/14/2023 1241   CALCIUM 9.7 01/31/2017 0931   CALCIUM 9.2 05/08/2014 1041   ALKPHOS 59 02/14/2023 1241   ALKPHOS 57 01/31/2017 0931   ALKPHOS 66 05/08/2014 1041   AST 24 02/14/2023 1241   AST 23 05/08/2014 1041   ALT 11 02/14/2023 1241   ALT 14 01/31/2017 0931   ALT 10 05/08/2014 1041   BILITOT 0.4 02/14/2023 1241   BILITOT 0.42 05/08/2014 1041      Impression and Plan: Ms. Wi is 89 year old female. She has a follicular large cell lymphoma.  It has been over 5 years since she had completion of her treatment with respect to maintenance Rituxan. She is  suspected to be cured by Dr. Rexene Edison.   Today her Hgb has improved well with the assistance of the IV iron sessions. I have suggested that we monitor her monthly right now to ensure stability of counts.    Disposition: Ok to de-access RTC 1 month APP, port labs (CBC, iron, ferritin, retic, B12, folate), +_ Feraheme -[place]   Rushie Chestnut, PA-C 10/22/20241:11 PM

## 2023-02-14 NOTE — Patient Instructions (Signed)

## 2023-02-16 ENCOUNTER — Inpatient Hospital Stay: Payer: Medicare Other

## 2023-02-16 ENCOUNTER — Inpatient Hospital Stay: Payer: Medicare Other | Admitting: Medical Oncology

## 2023-02-26 NOTE — Progress Notes (Unsigned)
Cardiology Office Note    Patient Name: Makayla Fisher Date of Encounter: 02/26/2023  Primary Care Provider:  Sunday Spillers, NP Primary Cardiologist:  Charlton Haws, MD Primary Electrophysiologist: None   Past Medical History    Past Medical History:  Diagnosis Date   Bradycardia 07/15/2014   CAD (coronary artery disease)    CABG 2009   Chronic diastolic (congestive) heart failure (HCC)    GERD (gastroesophageal reflux disease)    Heart attack (HCC) 2009   Heparin allergy 07/23/2020   History of blood transfusion 2009   Hypoxia 07/23/2020   Iron deficiency anemia due to chronic blood loss 08/07/2014   Mitral regurgitation    NHL (non-Hodgkin's lymphoma) (HCC) 10/18/2013   finished chemo dec 2015, maintenanance retuxin   Non Hodgkin's lymphoma (HCC) 04/30/2017   Sinus bradycardia    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 female with a PMH of CAD s/p CABG 2009 HFpEF, follicular large cell non-Hodgkin's lymphoma in remission, HTN, mild to moderate MR, bradycardia (not on BB), GERD, TIA, IDA who presents today for posthospital follow-up.  Ms. Thorton was seen initially by Dr. Eden Emms in 2010 for preoperative clearance of colostomy reversal.  She has a history of remote CABG in 2009 completed in [place].  She suffered a TIA in 11/2019 and received tPA and had ASA switch to Plavix.  She was diagnosed with NSTEMI in 07/2020 in the setting of hypertensive urgency and flash pulmonary edema.  She had a 2D echo completed 06/2020 with EF of 50-55% and grade 2 DD with mild PASP elevation and mild-moderate MR.  She uses 2 L nasal oxygen 24/7 currently resides in [facility] facility.    She was seen in the ED on 01/12/2023 with possible CHF exacerbation.  She reported progressive shortness of breath with exertion for the past several months.  She  was noted to have elevated BP in the 140s to 160s systolic and serial troponins were mildly elevated in the setting of demand ischemia.  CTA of the chest was negative for PE but showed emphysematous changes with solid pulmonary nodule measuring 15 mm with no recommendation for follow-up.  BNP was completed and noted to be elevated and patient was treated with 40 mg of IV Lasix.  She underwent 2D echo that showed EF of 55 to 60% with normal LV function and no RWMA with moderate asymmetrical LVH normal PASP with moderately dilated LA and moderate MVR and moderate mitral annular calcification.  She was deemed not a candidate for valve intervention and symptoms will be managed conservatively.  He had improvement of symptoms with IV diuresis and was discharged on oral diuretic regimen.   During today's visit the patient reports*** .  Patient denies chest pain, palpitations, dyspnea, PND, orthopnea, nausea, vomiting, dizziness, syncope, edema, weight gain, or early satiety.  ***Notes: May consider adding ARB and discontinuing amlodipine depending on BP trends. -Last ischemic evaluation: -Last echo: -Interim ED visits: Review of Systems  Please see the history of present illness.    All other systems reviewed and are otherwise negative except as noted above.  Physical Exam    Wt Readings from Last 3 Encounters:  01/16/23 144 lb 6.4 oz (65.5 kg)  08/29/22 148 lb (67.1 kg)  03/10/22 145 lb (65.8 kg)   WU:[id number] were no vitals filed for  this visit.,There is no height or weight on file to calculate BMI. GEN: Well nourished, well developed in no acute distress Neck: No JVD; No carotid bruits Pulmonary: Clear to auscultation without rales, wheezing or rhonchi  Cardiovascular: Normal rate. Regular rhythm. Normal S1. Normal S2.   Murmurs: There is no murmur.  ABDOMEN: Soft, non-tender, non-distended EXTREMITIES:  No edema; No deformity   EKG/LABS/ Recent Cardiac Studies   ECG personally reviewed by me  today - ***  Risk Assessment/Calculations:   {Does this patient have ATRIAL FIBRILLATION?[phone]}      Lab Results  Component Value Date   WBC 6.5 02/14/2023   HGB 9.6 (L) 02/14/2023   HCT 31.6 (L) 02/14/2023   MCV 94.9 02/14/2023   PLT 163 02/14/2023   Lab Results  Component Value Date   CREATININE 1.10 (H) 02/14/2023   BUN 16 02/14/2023   NA 142 02/14/2023   K 3.7 02/14/2023   CL 103 02/14/2023   CO2 30 02/14/2023   Lab Results  Component Value Date   CHOL 213 (H) 11/27/2019   HDL 58 11/27/2019   LDLCALC 136 (H) 11/27/2019   TRIG 97 11/27/2019   CHOLHDL 3.7 11/27/2019    Lab Results  Component Value Date   HGBA1C 5.4 11/27/2019   Assessment & Plan    1.  HFpEF: -Patient's BNP elevated to 1050 with demand ischemia in the setting of CHF exacerbation from chronic respiratory failure with improvement after diuresis -Today patient reports***  2.  Moderate MVR: -Patient deemed not a candidate for valve intervention 3.  History of CAD: -s/p CABG in 2009 with elevated troponin in the setting of demand ischemia in the setting of CHF exacerbation  4.  Essential hypertension: -Patient's BP today was***  5.  COPD: -Stage IV -CT of the chest showing new airspace disease      Disposition: Follow-up with Charlton Haws, MD or APP in *** months {Are you ordering a CV Procedure (e.g. stress test, cath, DCCV, TEE, etc)?   Press F2        :[id number]}   Signed, Napoleon Form, Leodis Rains, NP 02/26/2023, 4:47 PM [facility]

## 2023-02-27 ENCOUNTER — Ambulatory Visit: Payer: Medicare Other | Attending: Physician Assistant | Admitting: Nurse Practitioner

## 2023-02-27 ENCOUNTER — Ambulatory Visit: Payer: Medicare Other | Admitting: Adult Health

## 2023-02-27 ENCOUNTER — Encounter: Payer: Self-pay | Admitting: Nurse Practitioner

## 2023-02-27 VITALS — BP 162/74 | HR 66 | Ht 62.0 in | Wt 144.0 lb

## 2023-02-27 DIAGNOSIS — R600 Localized edema: Secondary | ICD-10-CM | POA: Diagnosis present

## 2023-02-27 DIAGNOSIS — I2510 Atherosclerotic heart disease of native coronary artery without angina pectoris: Secondary | ICD-10-CM | POA: Insufficient documentation

## 2023-02-27 DIAGNOSIS — I5032 Chronic diastolic (congestive) heart failure: Secondary | ICD-10-CM | POA: Diagnosis not present

## 2023-02-27 DIAGNOSIS — I340 Nonrheumatic mitral (valve) insufficiency: Secondary | ICD-10-CM | POA: Insufficient documentation

## 2023-02-27 DIAGNOSIS — I10 Essential (primary) hypertension: Secondary | ICD-10-CM | POA: Insufficient documentation

## 2023-02-27 DIAGNOSIS — J449 Chronic obstructive pulmonary disease, unspecified: Secondary | ICD-10-CM | POA: Insufficient documentation

## 2023-02-27 MED ORDER — AMLODIPINE BESYLATE 5 MG PO TABS: 7.5000 mg | ORAL_TABLET | Freq: Every day | ORAL | 2 refills | Status: DC

## 2023-02-27 NOTE — Patient Instructions (Signed)
Medication Instructions:  INCREASE Norvasc (Amlodipine) 7.5mg  Take 1.5 tab of 5mg  tablet once a day  *If you need a refill on your cardiac medications before your next appointment, please call your pharmacy*   Lab Work: None ordered If you have labs (blood work) drawn today and your tests are completely normal, you will receive your results only   Testing/Procedures: None ordered   Follow-Up: At [facility], you and your health needs are our priority.  As part of our continuing mission to provide you with exceptional heart care, we have created designated Provider Care Teams.  These Care Teams include your primary Cardiologist (physician) and Advanced Practice Providers (APPs -  Physician Assistants and Nurse Practitioners) who all work together to provide you with the care you need, when you need it.  We recommend signing up for the patient portal called "MyChart".  Sign up information is provided on this After Visit Summary.  MyChart is used to connect with patients for Virtual Visits (Telemedicine).  Patients are able to view lab/test results, encounter notes, upcoming appointments, etc.  Non-urgent messages can be sent to your provider as well.   To learn more about what you can do with MyChart, go to [email or web address].    Your next appointment:   2 month(s)  Provider:   Any APP Other Instructions  Check your blood pressure daily for 2 weeks, then contact the office with your readings.  Make sure to check 2 hours after your medications.   AVOID these things for 30 minutes before checking your blood pressure: No Drinking caffeine. No Drinking alcohol. No Eating. No Smoking. No Exercising.  Five minutes before checking your blood pressure: Pee. Sit in a dining chair. Avoid sitting in a soft couch or armchair. Be quiet. Do not talk.

## 2023-03-08 ENCOUNTER — Other Ambulatory Visit: Payer: Self-pay

## 2023-03-08 DIAGNOSIS — D500 Iron deficiency anemia secondary to blood loss (chronic): Secondary | ICD-10-CM

## 2023-03-09 ENCOUNTER — Inpatient Hospital Stay: Payer: Medicare Other

## 2023-03-09 ENCOUNTER — Encounter: Payer: Self-pay | Admitting: Hematology & Oncology

## 2023-03-09 ENCOUNTER — Encounter: Payer: Self-pay | Admitting: Medical Oncology

## 2023-03-09 ENCOUNTER — Other Ambulatory Visit (HOSPITAL_BASED_OUTPATIENT_CLINIC_OR_DEPARTMENT_OTHER): Payer: Self-pay

## 2023-03-09 ENCOUNTER — Inpatient Hospital Stay (HOSPITAL_BASED_OUTPATIENT_CLINIC_OR_DEPARTMENT_OTHER): Payer: Medicare Other | Admitting: Medical Oncology

## 2023-03-09 ENCOUNTER — Inpatient Hospital Stay: Payer: Medicare Other | Attending: Hematology & Oncology

## 2023-03-09 VITALS — BP 157/46 | HR 54 | Temp 96.9°F | Resp 18

## 2023-03-09 DIAGNOSIS — K909 Intestinal malabsorption, unspecified: Secondary | ICD-10-CM | POA: Insufficient documentation

## 2023-03-09 DIAGNOSIS — D500 Iron deficiency anemia secondary to blood loss (chronic): Secondary | ICD-10-CM | POA: Diagnosis not present

## 2023-03-09 DIAGNOSIS — Z79899 Other long term (current) drug therapy: Secondary | ICD-10-CM | POA: Diagnosis not present

## 2023-03-09 DIAGNOSIS — Z8572 Personal history of non-Hodgkin lymphomas: Secondary | ICD-10-CM | POA: Diagnosis present

## 2023-03-09 DIAGNOSIS — D649 Anemia, unspecified: Secondary | ICD-10-CM

## 2023-03-09 DIAGNOSIS — D509 Iron deficiency anemia, unspecified: Secondary | ICD-10-CM | POA: Insufficient documentation

## 2023-03-09 LAB — CMP (CANCER CENTER ONLY)
ALT: 12 U/L (ref 0–44)
AST: 22 U/L (ref 15–41)
Albumin: 4 g/dL (ref 3.5–5.0)
Alkaline Phosphatase: 56 U/L (ref 38–126)
Anion gap: 7 (ref 5–15)
BUN: 17 mg/dL (ref 8–23)
CO2: 33 mmol/L — ABNORMAL HIGH (ref 22–32)
Calcium: 9.5 mg/dL (ref 8.9–10.3)
Chloride: 102 mmol/L (ref 98–111)
Creatinine: 0.93 mg/dL (ref 0.44–1.00)
GFR, Estimated: 56 mL/min — ABNORMAL LOW (ref >60)
Glucose, Bld: 99 mg/dL (ref 70–99)
Potassium: 3.8 mmol/L (ref 3.5–5.1)
Sodium: 142 mmol/L (ref 135–145)
Total Bilirubin: 0.4 mg/dL (ref <1.2)
Total Protein: 5.8 g/dL — ABNORMAL LOW (ref 6.5–8.1)

## 2023-03-09 LAB — CBC WITH DIFFERENTIAL (CANCER CENTER ONLY)
Abs Immature Granulocytes: 0.03 10*3/uL (ref 0.00–0.07)
Basophils Absolute: 0.1 10*3/uL (ref 0.0–0.1)
Basophils Relative: 1 %
Eosinophils Absolute: 0.2 10*3/uL (ref 0.0–0.5)
Eosinophils Relative: 3 %
HCT: 31 % — ABNORMAL LOW (ref 36.0–46.0)
Hemoglobin: 9.6 g/dL — ABNORMAL LOW (ref 12.0–15.0)
Immature Granulocytes: 0 %
Lymphocytes Relative: 27 %
Lymphs Abs: 1.9 10*3/uL (ref 0.7–4.0)
MCH: 29.7 pg (ref 26.0–34.0)
MCHC: 31 g/dL (ref 30.0–36.0)
MCV: 96 fL (ref 80.0–100.0)
Monocytes Absolute: 0.8 10*3/uL (ref 0.1–1.0)
Monocytes Relative: 11 %
Neutro Abs: 4.2 10*3/uL (ref 1.7–7.7)
Neutrophils Relative %: 58 %
Platelet Count: 149 10*3/uL — ABNORMAL LOW (ref 150–400)
RBC: 3.23 MIL/uL — ABNORMAL LOW (ref 3.87–5.11)
RDW: 19.3 % — ABNORMAL HIGH (ref 11.5–15.5)
WBC Count: 7.1 10*3/uL (ref 4.0–10.5)
nRBC: 0 % (ref 0.0–0.2)

## 2023-03-09 LAB — FERRITIN: Ferritin: 132 ng/mL (ref 11–307)

## 2023-03-09 LAB — RETICULOCYTES
Immature Retic Fract: 23.9 % — ABNORMAL HIGH (ref 2.3–15.9)
RBC.: 3.25 MIL/uL — ABNORMAL LOW (ref 3.87–5.11)
Retic Count, Absolute: 65.3 10*3/uL (ref 19.0–186.0)
Retic Ct Pct: 2 % (ref 0.4–3.1)

## 2023-03-09 LAB — FOLATE: Folate: >40 ng/mL (ref >5.9)

## 2023-03-09 LAB — VITAMIN B12: Vitamin B-12: 458 pg/mL (ref 180–914)

## 2023-03-09 MED ORDER — INFLUENZA VAC A&B SURF ANT ADJ 0.5 ML IM SUSY
0.5000 mL | PREFILLED_SYRINGE | Freq: Once | INTRAMUSCULAR | 0 refills | Status: AC
  Filled 2023-03-09: qty 0.5, 1d supply, fill #0

## 2023-03-09 NOTE — Patient Instructions (Signed)

## 2023-03-09 NOTE — Progress Notes (Signed)
Hematology and Oncology Follow Up Visit  Makayla Fisher [id number] 1922/10/17 89 y.o. 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 is back for followup. We last saw her about 6 months ago. We currently also follow her for some IDA which is thought to be secondary to malabsorption.   Today she reports that she is well. She has no concerns.   Energy levels are stable and improved since starting IV iron.   She has tolerated the IV Feraheme well without side effects.   There has been no bleeding to her knowledge: denies epistaxis, gingivitis, hemoptysis, hematemesis, hematuria, melena, excessive bruising, blood donation.   No worsening or new SOB, no dizziness.   She denies lymphadenopathy, chest pain, fevers, night sweats. Weight is down slightly which she attributes to not eating as much as normal.  Overall, I would say performance status is probably ECOG 2.    Wt Readings from Last 3 Encounters:  02/27/23 144 lb (65.3 kg)  01/16/23 144 lb 6.4 oz (65.5 kg)  08/29/22 148 lb (67.1 kg)    Medications:  Current Outpatient Medications:    ALPRAZolam (XANAX) 0.5 MG tablet, Take 1 tablet (0.5 mg total) by mouth 3 (three) times daily as needed for anxiety., Disp: 16 tablet, Rfl: 0   amLODipine (NORVASC) 5 MG tablet, Take 1.5 tablets (7.5 mg total) by mouth daily., Disp: 45 tablet, Rfl: 2   ARTIFICIAL TEAR OP, Place 1 drop into both eyes as needed (dry eyes)., Disp: , Rfl:    aspirin 81 MG EC tablet, Take 1 tablet (81 mg total) by mouth daily. Swallow whole., Disp: 30 tablet, Rfl: 11   atorvastatin (LIPITOR) 40 MG tablet, Take 1 tablet (40 mg total) by mouth at bedtime., Disp: 30 tablet, Rfl: 30   Calcium  Carb-Cholecalciferol (CALCIUM 600+D3 PO), Take 1 tablet by mouth at bedtime., Disp: , Rfl:    Cholecalciferol (VITAMIN D3 SUPER STRENGTH) 50 MCG (2000 UT) CAPS, Take 4,000 Units by mouth daily. , Disp: , Rfl:    Cranberry 425 MG CAPS, Take 425 mg by mouth daily. , Disp: , Rfl:    diclofenac Sodium (VOLTAREN) 1 % GEL, Apply topically 4 (four) times daily., Disp: , Rfl:    docusate sodium (COLACE) 100 MG capsule, Take 100 mg by mouth daily as needed for mild constipation., Disp: , Rfl:    furosemide (LASIX) 20 MG tablet, Take 1 tablet (20 mg total) by mouth daily., Disp: 30 tablet, Rfl: 3   hydrOXYzine (ATARAX) 50 MG tablet, SMARTSIG:1 Tablet(s) By Mouth Every 12 Hours PRN, Disp: , Rfl:    levothyroxine (SYNTHROID) 25 MCG tablet, Take 25 mcg by mouth daily before breakfast., Disp: , Rfl:    lidocaine-prilocaine (EMLA) cream, Apply 1 application topically See admin instructions. Apply to port-a-cath one hour prior to procedure, Disp: , Rfl:    MUCUS RELIEF 600 MG 12 hr tablet, Take by mouth., Disp: , Rfl:    Multiple Vitamin (DAILY VITE PO), Take 1 tablet by mouth daily., Disp: , Rfl:    Multiple Vitamins-Minerals (PRESERVISION AREDS PO), Take 1 tablet by mouth in the morning and at bedtime., Disp: , Rfl:    nystatin (NYAMYC) powder, Apply 1 Application topically as needed., Disp: , Rfl:  OXYGEN, Inhale 2 L into the lungs continuous., Disp: , Rfl:    psyllium (METAMUCIL) 58.6 % packet, Take 1 packet by mouth every Monday, Wednesday, and Friday. ORANGE, Disp: , Rfl:    rOPINIRole (REQUIP) 1 MG tablet, Take 1 mg by mouth at bedtime., Disp: , Rfl:    sertraline (ZOLOFT) 25 MG tablet, Take 25 mg by mouth daily., Disp: , Rfl:    timolol (TIMOPTIC) 0.5 % ophthalmic solution, Place 1 drop into both eyes 2 (two) times daily., Disp: , Rfl:    trimethoprim (TRIMPEX) 100 MG tablet, Take 100 mg by mouth daily., Disp: , Rfl:    umeclidinium bromide (INCRUSE ELLIPTA) 62.5 MCG/ACT AEPB, Inhale 1 puff into the  lungs daily., Disp: 30 each, Rfl: 11   azithromycin (ZITHROMAX) 250 MG tablet, Take 1 tablet (250 mg total) by mouth daily. (Patient not taking: Reported on 03/09/2023), Disp: 3 each, Rfl: 0  Allergies:  Allergies  Allergen Reactions   Epipen [Epinephrine Hcl (Nasal)] Palpitations   Levaquin [Levofloxacin In D5w] Other (See Comments)    Weakness- "Allergic," per MAR   Beta Adrenergic Blockers Other (See Comments)    intolerant to higher doses of BB due to bradycardia.   Chlorhexidine Gluconate Other (See Comments)    Pt declined use and suspects she may have an intolerance   Heparin Other (See Comments)    "Allergic," pre MAR- Maybe affected platelets, per patient Pt has tolerated heparin flush many times since 2015   Sulfa Antibiotics Rash    Past Medical History, Surgical history, Social history, and Family History were reviewed and updated.  Review of Systems: Review of Systems  Constitutional: Negative.   HENT: Negative.    Eyes: Negative.   Respiratory:  Negative for shortness of breath.   Cardiovascular:  Negative for chest pain and palpitations.  Gastrointestinal:  Negative for diarrhea and nausea.  Genitourinary: Negative.   Musculoskeletal:  Positive for joint pain and myalgias.  Skin: Negative.   Neurological:  Positive for tingling.  Endo/Heme/Allergies: Negative.   Psychiatric/Behavioral: Negative.       Physical Exam:  temporal temperature is 96.9 F (36.1 C) (abnormal). Her blood pressure is 157/46 (abnormal) and her pulse is 54 (abnormal). Her respiration is 18 and oxygen saturation is 94%.   Physical Exam Vitals reviewed.  Constitutional:      Comments: Resting comfortably in a wheelchair. Has on portable oxygen via nasal canula.   HENT:     Head: Normocephalic and atraumatic.  Eyes:     Pupils: Pupils are equal, round, and reactive to light.  Cardiovascular:     Rate and Rhythm: Normal rate and regular rhythm.     Heart sounds: Normal heart sounds.   Pulmonary:     Effort: Pulmonary effort is normal.     Breath sounds: Normal breath sounds.  Abdominal:     General: Bowel sounds are normal.     Palpations: Abdomen is soft.  Musculoskeletal:        General: No tenderness or deformity. Normal range of motion.     Cervical back: Normal range of motion.  Lymphadenopathy:     Cervical: No cervical adenopathy.  Skin:    General: Skin is warm and dry.     Findings: No erythema or rash.  Neurological:     Mental Status: She is alert and oriented to person, place, and time.  Psychiatric:        Behavior: Behavior normal.        Thought  Content: Thought content normal.        Judgment: Judgment normal.      Lab Results  Component Value Date   WBC 7.1 03/09/2023   HGB 9.6 (L) 03/09/2023   HCT 31.0 (L) 03/09/2023   MCV 96.0 03/09/2023   PLT 149 (L) 03/09/2023     Chemistry      Component Value Date/Time   NA 142 03/09/2023 1255   NA 143 01/31/2017 0931   NA 141 05/08/2014 1041   K 3.8 03/09/2023 1255   K 3.7 01/31/2017 0931   K 4.5 05/08/2014 1041   CL 102 03/09/2023 1255   CL 106 01/31/2017 0931   CO2 33 (H) 03/09/2023 1255   CO2 27 01/31/2017 0931   CO2 24 05/08/2014 1041   BUN 17 03/09/2023 1255   BUN 10 01/31/2017 0931   BUN 14.2 05/08/2014 1041   CREATININE 0.93 03/09/2023 1255   CREATININE 0.9 01/31/2017 0931   CREATININE 0.8 05/08/2014 1041      Component Value Date/Time   CALCIUM 9.5 03/09/2023 1255   CALCIUM 9.7 01/31/2017 0931   CALCIUM 9.2 05/08/2014 1041   ALKPHOS 56 03/09/2023 1255   ALKPHOS 57 01/31/2017 0931   ALKPHOS 66 05/08/2014 1041   AST 22 03/09/2023 1255   AST 23 05/08/2014 1041   ALT 12 03/09/2023 1255   ALT 14 01/31/2017 0931   ALT 10 05/08/2014 1041   BILITOT 0.4 03/09/2023 1255   BILITOT 0.42 05/08/2014 1041     Encounter Diagnoses  Name Primary?   Iron deficiency anemia due to chronic blood loss Yes   Symptomatic anemia     Impression and Plan: Ms. Hardnett is 89 year old  female. She has a follicular large cell lymphoma.  It has been over 5 years since she had completion of her treatment with respect to maintenance Rituxan. She is suspected to be cured by Dr. Rexene Edison.   Hgb is stable today. She has had 2 round of IV iron. Iron levels pending. Will also add B12 and folate to help maximize Hgb.   We will continue to monitor her monthly right now to ensure stability of counts.   Disposition: No Feraheme today RTC 1 month APP, port labs (CBC, sample blood bank, iron, ferritin, retic, B12, folate)   Brand Males [place], PA-C 11/14/20241:45 PM

## 2023-03-10 LAB — IRON AND IRON BINDING CAPACITY (CC-WL,HP ONLY)
Iron: 42 ug/dL (ref 28–170)
Saturation Ratios: 16 % (ref 10.4–31.8)
TIBC: 258 ug/dL (ref 250–450)
UIBC: 216 ug/dL (ref 148–442)

## 2023-03-13 ENCOUNTER — Encounter: Payer: Self-pay | Admitting: *Deleted

## 2023-03-22 ENCOUNTER — Ambulatory Visit: Payer: Medicare Other | Admitting: Physician Assistant

## 2023-04-10 ENCOUNTER — Inpatient Hospital Stay: Payer: Medicare Other | Admitting: Medical Oncology

## 2023-04-10 ENCOUNTER — Inpatient Hospital Stay: Payer: Medicare Other | Attending: Hematology & Oncology

## 2023-04-10 ENCOUNTER — Inpatient Hospital Stay: Payer: Medicare Other

## 2023-04-26 NOTE — Progress Notes (Signed)
 Cardiology Office Note    Patient Name: Makayla Fisher Date of Encounter: 04/27/2023  Primary Care Provider:  Leontine Phebe Charlotte ONEIDA, NP Primary Cardiologist:  Maude Emmer, MD Primary Electrophysiologist: None   Past Medical History    Past Medical History:  Diagnosis Date   Bradycardia 07/15/2014   CAD (coronary artery disease)    CABG 2009   Chronic diastolic (congestive) heart failure (HCC)    GERD (gastroesophageal reflux disease)    Heart attack (HCC) 2009   Heparin  allergy 07/23/2020   History of blood transfusion 2009   Hypoxia 07/23/2020   Iron  deficiency anemia due to chronic blood loss 08/07/2014   Mitral regurgitation    NHL (non-Hodgkin's lymphoma) (HCC) 10/18/2013   finished chemo dec 2015, maintenanance retuxin   Non Hodgkin's lymphoma (HCC) 04/30/2017   Sinus bradycardia    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 female with a PMH of CAD s/p CABG 2009 HFpEF, follicular large cell non-Hodgkin's lymphoma in remission, HTN, mild to moderate MR, bradycardia (not on BB), GERD, TIA, IDA who presents today for 30-month follow-up.  Ms. Thorton was seen on 02/27/23 following ED visit for CHF and acute respiratory failure.  She underwent 2D echo that showed EF of 55 to 60% with no RWMA and moderate MVR.  She was deemed not a candidate for surgical intervention and managed conservatively.  During follow-up patient was feeling better however BP was still elevated and amlodipine  was increased to 7.5 mg for better BP control and to assist with afterload of heart.  She was encouraged to use compression socks and elevate extremities due to peripheral edema.  Ms. Thorton presents today for 1 month follow-up.  She reports no issues with the Lasix  medication prescribed to manage fluid backup due to her weak heart valve. However, she mentions experiencing  postnasal drip or phlegm, particularly in the mornings, but does not cough. She denies any leg swelling. Her mobility is limited due to shortness of breath, which she experiences when walking even short distances. She currently uses a wheelchair for mobility within her healthcare facility. She expresses concern about her lack of physical activity and is unsure about the benefits of physical therapy. She is on oxygen  therapy. She reports adjusting her oxygen  flow as needed. She expresses concern about the progression of her COPD and its impact on her mobility and quality of life.   Patient denies chest pain, palpitations, dyspnea, PND, orthopnea, nausea, vomiting, dizziness, syncope, edema, weight gain, or early satiety.  Review of Systems  Please see the history of present illness.    All other systems reviewed and are otherwise negative except as noted above.  Physical Exam    Wt Readings from Last 3 Encounters:  04/27/23 146 lb 6.4 oz (66.4 kg)  02/27/23 144 lb (65.3 kg)  01/16/23 144 lb 6.4 oz (65.5 kg)   VS: Vitals:   04/27/23 1411 04/27/23 1450  BP: (!) 159/60 (!) 120/48  Pulse: 75   Resp: 16   SpO2: 95%   ,Body mass index is 26.78 kg/m. GEN: Well nourished, well developed in no acute distress Neck: No JVD; No carotid bruits Pulmonary: Clear to auscultation without rales, wheezing or rhonchi  Cardiovascular: Normal rate. Regular rhythm. Normal S1. Normal S2.   Murmurs: There is no murmur.  ABDOMEN: Soft, non-tender, non-distended EXTREMITIES:  No edema;  No deformity   EKG/LABS/ Recent Cardiac Studies   ECG personally reviewed by me today -none completed today  Risk Assessment/Calculations:          Lab Results  Component Value Date   WBC 7.1 03/09/2023   HGB 9.6 (L) 03/09/2023   HCT 31.0 (L) 03/09/2023   MCV 96.0 03/09/2023   PLT 149 (L) 03/09/2023   Lab Results  Component Value Date   CREATININE 0.93 03/09/2023   BUN 17 03/09/2023   NA 142 03/09/2023   K 3.8  03/09/2023   CL 102 03/09/2023   CO2 33 (H) 03/09/2023   Lab Results  Component Value Date   CHOL 213 (H) 11/27/2019   HDL 58 11/27/2019   LDLCALC 136 (H) 11/27/2019   TRIG 97 11/27/2019   CHOLHDL 3.7 11/27/2019    Lab Results  Component Value Date   HGBA1C 5.4 11/27/2019   Assessment & Plan    1.HFpEF:  -Most recent 2D echo with EF of 55 to 60% with normal LV function and moderate MVR and moderate mitral annular calcification  Patient is euvolemic on examination today. -Continue Lasix  20 mg daily -Low sodium diet, fluid restriction <2L, and daily weights encouraged. Educated to contact our office for weight gain of 2 lbs overnight or 5 lbs in one week.   2.Moderate MVR:  -Stable with Lasix  20mg  daily. No leg swelling or worsening shortness of breath. -Continue Lasix  20mg  daily. -Encourage physical activity as tolerated to promote circulation and prevent deconditioning.  3.History of CAD: -s/p CABG in 2009 with no complaint of chest pain or angina since previous follow-up. -Continue Lipitor 40 mg daily and ASA 81 mg  4.Essential hypertension: -Patient's BP today was initially elevated at 159/60 and was 120/48 on recheck. -Continue amlodipine  7.5 mg daily  5. COPD: -Hyperinflation of lungs noted on recent CT chest, indicative of COPD. Wheezing noted on current examination. -Continue daily inhaler use.  6.  Peripheral edema: -Patient's fluid volume status today was stable with no pitting edema present. -Continue Lasix  20 mg daily  Disposition: Follow-up with Maude Emmer, MD or APP in 6 months    Signed, Wyn Raddle, Jackee Shove, NP 04/27/2023, 5:09 PM [facility]

## 2023-04-27 ENCOUNTER — Encounter: Payer: Self-pay | Admitting: Nurse Practitioner

## 2023-04-27 ENCOUNTER — Ambulatory Visit: Payer: Medicare Other | Attending: Nurse Practitioner | Admitting: Nurse Practitioner

## 2023-04-27 VITALS — BP 120/48 | HR 75 | Resp 16 | Ht 62.0 in | Wt 146.4 lb

## 2023-04-27 DIAGNOSIS — I10 Essential (primary) hypertension: Secondary | ICD-10-CM | POA: Insufficient documentation

## 2023-04-27 DIAGNOSIS — J449 Chronic obstructive pulmonary disease, unspecified: Secondary | ICD-10-CM | POA: Insufficient documentation

## 2023-04-27 DIAGNOSIS — R600 Localized edema: Secondary | ICD-10-CM | POA: Insufficient documentation

## 2023-04-27 DIAGNOSIS — I340 Nonrheumatic mitral (valve) insufficiency: Secondary | ICD-10-CM | POA: Diagnosis not present

## 2023-04-27 DIAGNOSIS — I5032 Chronic diastolic (congestive) heart failure: Secondary | ICD-10-CM | POA: Insufficient documentation

## 2023-04-27 NOTE — Patient Instructions (Addendum)
 Medication Instructions:  Your physician recommends that you continue on your current medications as directed. Please refer to the Current Medication list given to you today.  *If you need a refill on your cardiac medications before your next appointment, please call your pharmacy*  Lab Work: If you have labs (blood work) drawn today and your tests are completely normal, you will receive your results only by: MyChart Message (if you have MyChart) OR A paper copy in the mail If you have any lab test that is abnormal or we need to change your treatment, we will call you to review the results.  Testing/Procedures: None ordered today.  Follow-Up: At [facility], you and your health needs are our priority.  As part of our continuing mission to provide you with exceptional heart care, we have created designated Provider Care Teams.  These Care Teams include your primary Cardiologist (physician) and Advanced Practice Providers (APPs -  Physician Assistants and Nurse Practitioners) who all work together to provide you with the care you need, when you need it.  We recommend signing up for the patient portal called "MyChart".  Sign up information is provided on this After Visit Summary.  MyChart is used to connect with patients for Virtual Visits (Telemedicine).  Patients are able to view lab/test results, encounter notes, upcoming appointments, etc.  Non-urgent messages can be sent to your provider as well.   To learn more about what you can do with MyChart, go to [email or web address].    Your next appointment:   6 month(s)  Provider:   Charlton Haws, MD

## 2023-06-21 ENCOUNTER — Emergency Department (HOSPITAL_COMMUNITY): Payer: Medicare Other

## 2023-06-21 ENCOUNTER — Encounter (HOSPITAL_COMMUNITY): Payer: Self-pay | Admitting: Emergency Medicine

## 2023-06-21 ENCOUNTER — Emergency Department (HOSPITAL_COMMUNITY)
Admission: EM | Admit: 2023-06-21 | Discharge: 2023-06-22 | Disposition: A | Discharge status: Home / Self Care | Payer: Medicare Other | Attending: Emergency Medicine | Admitting: Emergency Medicine

## 2023-06-21 ENCOUNTER — Other Ambulatory Visit: Payer: Self-pay

## 2023-06-21 DIAGNOSIS — Z7982 Long term (current) use of aspirin: Secondary | ICD-10-CM | POA: Diagnosis not present

## 2023-06-21 DIAGNOSIS — K529 Noninfective gastroenteritis and colitis, unspecified: Secondary | ICD-10-CM | POA: Diagnosis not present

## 2023-06-21 DIAGNOSIS — R7989 Other specified abnormal findings of blood chemistry: Secondary | ICD-10-CM | POA: Diagnosis not present

## 2023-06-21 DIAGNOSIS — E039 Hypothyroidism, unspecified: Secondary | ICD-10-CM | POA: Insufficient documentation

## 2023-06-21 DIAGNOSIS — I2510 Atherosclerotic heart disease of native coronary artery without angina pectoris: Secondary | ICD-10-CM | POA: Insufficient documentation

## 2023-06-21 DIAGNOSIS — J449 Chronic obstructive pulmonary disease, unspecified: Secondary | ICD-10-CM | POA: Insufficient documentation

## 2023-06-21 DIAGNOSIS — R112 Nausea with vomiting, unspecified: Secondary | ICD-10-CM | POA: Diagnosis present

## 2023-06-21 DIAGNOSIS — R0602 Shortness of breath: Secondary | ICD-10-CM | POA: Diagnosis not present

## 2023-06-21 DIAGNOSIS — I509 Heart failure, unspecified: Secondary | ICD-10-CM | POA: Insufficient documentation

## 2023-06-21 LAB — COMPREHENSIVE METABOLIC PANEL
ALT: 13 U/L (ref 0–44)
AST: 26 U/L (ref 15–41)
Albumin: 3.1 g/dL — ABNORMAL LOW (ref 3.5–5.0)
Alkaline Phosphatase: 64 U/L (ref 38–126)
Anion gap: 11 (ref 5–15)
BUN: 34 mg/dL — ABNORMAL HIGH (ref 8–23)
CO2: 26 mmol/L (ref 22–32)
Calcium: 8.9 mg/dL (ref 8.9–10.3)
Chloride: 102 mmol/L (ref 98–111)
Creatinine, Ser: 1.34 mg/dL — ABNORMAL HIGH (ref 0.44–1.00)
GFR, Estimated: 36 mL/min — ABNORMAL LOW (ref >60)
Glucose, Bld: 107 mg/dL — ABNORMAL HIGH (ref 70–99)
Potassium: 4.2 mmol/L (ref 3.5–5.1)
Sodium: 139 mmol/L (ref 135–145)
Total Bilirubin: 0.6 mg/dL (ref 0.0–1.2)
Total Protein: 5.7 g/dL — ABNORMAL LOW (ref 6.5–8.1)

## 2023-06-21 LAB — RESP PANEL BY RT-PCR (RSV, FLU A&B, COVID)  RVPGX2
Influenza A by PCR: NEGATIVE
Influenza B by PCR: NEGATIVE
Resp Syncytial Virus by PCR: NEGATIVE
SARS Coronavirus 2 by RT PCR: NEGATIVE

## 2023-06-21 LAB — CBC WITH DIFFERENTIAL/PLATELET
Abs Immature Granulocytes: 0.05 10*3/uL (ref 0.00–0.07)
Basophils Absolute: 0 10*3/uL (ref 0.0–0.1)
Basophils Relative: 0 %
Eosinophils Absolute: 0 10*3/uL (ref 0.0–0.5)
Eosinophils Relative: 0 %
HCT: 26.2 % — ABNORMAL LOW (ref 36.0–46.0)
Hemoglobin: 8.2 g/dL — ABNORMAL LOW (ref 12.0–15.0)
Immature Granulocytes: 0 %
Lymphocytes Relative: 5 %
Lymphs Abs: 0.6 10*3/uL — ABNORMAL LOW (ref 0.7–4.0)
MCH: 27.8 pg (ref 26.0–34.0)
MCHC: 31.3 g/dL (ref 30.0–36.0)
MCV: 88.8 fL (ref 80.0–100.0)
Monocytes Absolute: 0.5 10*3/uL (ref 0.1–1.0)
Monocytes Relative: 4 %
Neutro Abs: 10.2 10*3/uL — ABNORMAL HIGH (ref 1.7–7.7)
Neutrophils Relative %: 91 %
Platelets: 133 10*3/uL — ABNORMAL LOW (ref 150–400)
RBC: 2.95 MIL/uL — ABNORMAL LOW (ref 3.87–5.11)
RDW: 16.8 % — ABNORMAL HIGH (ref 11.5–15.5)
WBC: 11.3 10*3/uL — ABNORMAL HIGH (ref 4.0–10.5)
nRBC: 0 % (ref 0.0–0.2)

## 2023-06-21 LAB — LIPASE, BLOOD: Lipase: 26 U/L (ref 11–51)

## 2023-06-21 LAB — TROPONIN I (HIGH SENSITIVITY)
Troponin I (High Sensitivity): 46 ng/L — ABNORMAL HIGH (ref <18)
Troponin I (High Sensitivity): 49 ng/L — ABNORMAL HIGH (ref <18)

## 2023-06-21 LAB — BRAIN NATRIURETIC PEPTIDE: B Natriuretic Peptide: 902.3 pg/mL — ABNORMAL HIGH (ref 0.0–100.0)

## 2023-06-21 MED ORDER — IOHEXOL 300 MG/ML  SOLN
100.0000 mL | Freq: Once | INTRAMUSCULAR | Status: AC | PRN
  Administered 2023-06-21: 100 mL via INTRAVENOUS

## 2023-06-21 MED ORDER — AMOXICILLIN-POT CLAVULANATE 875-125 MG PO TABS: 1.0000 | ORAL_TABLET | Freq: Two times a day (BID) | ORAL | 0 refills | Status: DC

## 2023-06-21 MED ORDER — FUROSEMIDE 10 MG/ML IJ SOLN
20.0000 mg | Freq: Once | INTRAMUSCULAR | Status: AC
  Administered 2023-06-21: 20 mg via INTRAVENOUS
  Filled 2023-06-21: qty 4

## 2023-06-21 MED ORDER — AMOXICILLIN-POT CLAVULANATE 875-125 MG PO TABS
1.0000 | ORAL_TABLET | Freq: Once | ORAL | Status: AC
  Administered 2023-06-22: 1 via ORAL
  Filled 2023-06-21: qty 1

## 2023-06-21 MED ORDER — ONDANSETRON HCL 4 MG PO TABS: 4.0000 mg | ORAL_TABLET | Freq: Four times a day (QID) | ORAL | 0 refills | Status: DC

## 2023-06-21 MED ORDER — ONDANSETRON HCL 4 MG/2ML IJ SOLN
4.0000 mg | Freq: Once | INTRAMUSCULAR | Status: AC
  Administered 2023-06-21: 4 mg via INTRAVENOUS
  Filled 2023-06-21: qty 2

## 2023-06-21 NOTE — ED Triage Notes (Signed)
 Patient presents from [facility] with vomiting since last night. Her colostomy bag also came off and she was unable to get it back on. EMS replaced the bag.    Hx diverticulitis    EMS vitals: 104/63 BP 106 HR 92% SPO2 on 2 L O 2 nasal canula  (baseline)

## 2023-06-21 NOTE — Discharge Instructions (Addendum)
 You were seen in the ER for your vomiting, diarrhea and shortness of breath.  Your workup showed that you have colitis.  I have given you a course of antibiotics he should take this as prescribed as well as a nausea medicine that you can take as needed.  You should make sure that you are continuing to take your Lasix as prescribed as you did have some extra fluid on you in the ER.  You can follow-up with your primary doctor to have your symptoms rechecked.  You should return to the emergency department for worsening shortness of breath, significantly worsening abdominal pain, worsening dehydration or any other new or concerning symptoms.

## 2023-06-21 NOTE — ED Notes (Incomplete)
 Patient's POA would like to be informed with updates. Phone number can be found in the chart.

## 2023-06-21 NOTE — ED Provider Notes (Signed)
 [place] EMERGENCY DEPARTMENT AT [facility] Provider Note   CSN: [id number] Arrival date & time: 06/21/23  1627     History  Chief Complaint  Patient presents with   Emesis    Makayla Fisher is a 89 y.o. female.  Patient is a 89 year old female with past medical history of diverticulitis with colostomy in place, CAD, CHF on 2 L home O2, hypothyroidism, COPD presenting to the emergency department with shortness of breath and vomiting.  Patient states that she has had increasing shortness of breath for the last several days.  She states that she started to develop nausea and vomiting throughout last night.  She states she has had increased watery output from her colostomy and that her colostomy bag fell off from all the output.  She states that she has had some pain around her colostomy site.  She denies any fevers.  States that she has had a cough.  She denies any lower extremity swelling.  She denies any associated chest pain.  The history is provided by the patient.  Emesis      Home Medications Prior to Admission medications   Medication Sig Start Date End Date Taking? Authorizing Provider  amoxicillin-clavulanate (AUGMENTIN) 875-125 MG tablet Take 1 tablet by mouth every 12 (twelve) hours. 06/21/23  Yes Theresia Lo, Turkey K, DO  ondansetron (ZOFRAN) 4 MG tablet Take 1 tablet (4 mg total) by mouth every 6 (six) hours. 06/21/23  Yes Theresia Lo, Turkey K, DO  ALPRAZolam Prudy Feeler) 0.5 MG tablet Take 1 tablet (0.5 mg total) by mouth 3 (three) times daily as needed for anxiety. Patient not taking: Reported on 04/27/2023 01/16/23 01/16/24  Alberteen Sam, MD  amLODipine (NORVASC) 5 MG tablet Take 1.5 tablets (7.5 mg total) by mouth daily. 02/27/23   Gaston Islam., NP  ARTIFICIAL TEAR OP Place 1 drop into both eyes as needed (dry eyes).    [provider]  aspirin 81 MG EC tablet Take 1 tablet (81 mg total) by mouth daily. Swallow whole. 07/28/20   Rai,  Delene Ruffini, MD  atorvastatin (LIPITOR) 40 MG tablet Take 1 tablet (40 mg total) by mouth at bedtime. 07/27/20   Rai, Delene Ruffini, MD  Calcium Carb-Cholecalciferol (CALCIUM 600+D3 PO) Take 1 tablet by mouth at bedtime.    [provider]  Cholecalciferol (VITAMIN D3 SUPER STRENGTH) 50 MCG (2000 UT) CAPS Take 4,000 Units by mouth daily.     [provider]  Cranberry 425 MG CAPS Take 425 mg by mouth daily.     [provider]  diclofenac Sodium (VOLTAREN) 1 % GEL Apply topically 4 (four) times daily.    [provider]  docusate sodium (COLACE) 100 MG capsule Take 100 mg by mouth daily as needed for mild constipation.    [provider]  furosemide (LASIX) 20 MG tablet Take 1 tablet (20 mg total) by mouth daily. 07/28/20   Rai, Delene Ruffini, MD  levothyroxine (SYNTHROID) 25 MCG tablet Take 25 mcg by mouth daily before breakfast.    [provider]  lidocaine-prilocaine (EMLA) cream Apply 1 application topically See admin instructions. Apply to port-a-cath one hour prior to procedure 09/16/19   [provider]  loratadine (CLARITIN) 10 MG tablet Take 10 mg by mouth daily.    [provider]  MUCUS RELIEF 600 MG 12 hr tablet Take by mouth. Patient not taking: Reported on 04/27/2023 02/23/22   [provider]  Multiple Vitamin (DAILY VITE PO) Take 1  tablet by mouth daily.    [provider]  Multiple Vitamins-Minerals (PRESERVISION AREDS PO) Take 1 tablet by mouth in the morning and at bedtime.    [provider]  nystatin [facility]) powder Apply 1 Application topically as needed.    [provider]  OXYGEN Inhale 2 L into the lungs continuous.    [provider]  psyllium (METAMUCIL) 58.6 % packet Take 1 packet by mouth every Monday, Wednesday, and Friday. ORANGE 11/29/19   Layne Benton, NP  REFRESH RELIEVA PF 0.5-1 % SOLN Place 1-2 drops into both eyes as needed. 03/13/23   [provider]   rOPINIRole (REQUIP) 1 MG tablet Take 1 mg by mouth at bedtime.    [provider]  sertraline (ZOLOFT) 25 MG tablet Take 25 mg by mouth daily. 10/05/21   [provider]  timolol (TIMOPTIC) 0.5 % ophthalmic solution Place 1 drop into both eyes 2 (two) times daily.    [provider]  trimethoprim (TRIMPEX) 100 MG tablet Take 100 mg by mouth daily. 11/15/20   [provider]  umeclidinium bromide (INCRUSE ELLIPTA) 62.5 MCG/ACT AEPB Inhale 1 puff into the lungs daily. 08/09/21   Martina Sinner, MD      Allergies    Epipen [epinephrine hcl (nasal)], Levaquin [levofloxacin in d5w], Beta adrenergic blockers, Chlorhexidine gluconate, Heparin, and Sulfa antibiotics    Review of Systems   Review of Systems  Gastrointestinal:  Positive for vomiting.    Physical Exam Updated Vital Signs BP (!) 179/151   Pulse 86   Temp 99.3 F (37.4 C)   Resp 20   SpO2 93%  Physical Exam Vitals and nursing note reviewed.  Constitutional:      General: She is not in acute distress.    Appearance: Normal appearance. She is ill-appearing.  HENT:     Head: Normocephalic and atraumatic.     Nose: Nose normal.     Mouth/Throat:     Mouth: Mucous membranes are dry.     Pharynx: Oropharynx is clear.  Eyes:     Extraocular Movements: Extraocular movements intact.     Conjunctiva/sclera: Conjunctivae normal.  Cardiovascular:     Rate and Rhythm: Normal rate and regular rhythm.     Heart sounds: Normal heart sounds.  Pulmonary:     Effort: No respiratory distress.     Comments: Tachypneic, no increased work of breathing Diffuse crackles bilaterally Abdominal:     General: Abdomen is flat.     Palpations: Abdomen is soft.     Tenderness: There is abdominal tenderness (Mild tenderness around colostomy site, watery brown output in colostomy bag).  Musculoskeletal:        General: Normal range of motion.     Cervical back: Normal range of motion.     Right lower leg:  No edema.     Left lower leg: No edema.  Skin:    General: Skin is warm and dry.  Neurological:     General: No focal deficit present.     Mental Status: She is alert and oriented to person, place, and time.  Psychiatric:        Mood and Affect: Mood normal.        Behavior: Behavior normal.     ED Results / Procedures / Treatments   Labs (all labs ordered are listed, but only abnormal results are displayed) Labs Reviewed  BRAIN NATRIURETIC PEPTIDE - Abnormal; Notable for the following components:  Result Value   B Natriuretic Peptide 902.3 (*)    All other components within normal limits  CBC WITH DIFFERENTIAL/PLATELET - Abnormal; Notable for the following components:   WBC 11.3 (*)    RBC 2.95 (*)    Hemoglobin 8.2 (*)    HCT 26.2 (*)    RDW 16.8 (*)    Platelets 133 (*)    Neutro Abs 10.2 (*)    Lymphs Abs 0.6 (*)    All other components within normal limits  COMPREHENSIVE METABOLIC PANEL - Abnormal; Notable for the following components:   Glucose, Bld 107 (*)    BUN 34 (*)    Creatinine, Ser 1.34 (*)    Total Protein 5.7 (*)    Albumin 3.1 (*)    GFR, Estimated 36 (*)    All other components within normal limits  TROPONIN I (HIGH SENSITIVITY) - Abnormal; Notable for the following components:   Troponin I (High Sensitivity) 46 (*)    All other components within normal limits  TROPONIN I (HIGH SENSITIVITY) - Abnormal; Notable for the following components:   Troponin I (High Sensitivity) 49 (*)    All other components within normal limits  RESP PANEL BY RT-PCR (RSV, FLU A&B, COVID)  RVPGX2  LIPASE, BLOOD    EKG None  Radiology CT ABDOMEN PELVIS W CONTRAST Result Date: 06/21/2023 CLINICAL DATA:  Acute abdominal pain EXAM: CT ABDOMEN AND PELVIS WITH CONTRAST TECHNIQUE: Multidetector CT imaging of the abdomen and pelvis was performed using the standard protocol following bolus administration of intravenous contrast. RADIATION DOSE REDUCTION: This exam was  performed according to the departmental dose-optimization program which includes automated exposure control, adjustment of the mA and/or kV according to patient size and/or use of iterative reconstruction technique. CONTRAST:  OMNIPAQUE IOHEXOL 300 MG/ML  SOLN COMPARISON:  CT abdomen and pelvis 03/15/2017. CT of the chest 03/26/2021. CT chest 01/12/2023. FINDINGS: Lower chest: Focal density in the left lower lobe peripherally measuring 18 x 6 mm appears unchanged. There is a trace left pleural effusion. Hepatobiliary: No focal liver abnormality is seen. Status post cholecystectomy. No biliary dilatation. Pancreas: Unremarkable. No pancreatic ductal dilatation or surrounding inflammatory changes. Spleen: Normal in size without focal abnormality. Adrenals/Urinary Tract: Exophytic cyst from the superior pole the left kidney measures up to 12 cm and appears unchanged from the prior study. Additional smaller bilateral renal cysts are again noted. There is no hydronephrosis or perinephric stranding. The adrenal glands and bladder are within normal limits. Stomach/Bowel: Left lower quadrant colostomy is again seen. Colonic diverticula are present. There some wall thickening of the colon within the abdominal wall without significant surrounding inflammation. There is no bowel obstruction, pneumatosis or free air. The stomach is within normal limits. The appendix is not visualized. Vascular/Lymphatic: Aortic atherosclerosis. No enlarged abdominal or pelvic lymph nodes. Reproductive: Status post hysterectomy. No adnexal masses. Other: No abdominal wall hernia or abnormality. No abdominopelvic ascites. Musculoskeletal: Degenerative changes affect the spine. IMPRESSION: 1. Left lower quadrant colostomy. There is some wall thickening of the colon within the abdominal wall without significant surrounding inflammation. Findings may represent colitis. 2. Colonic diverticulosis. 3. Stable left lower lobe pulmonary nodule. 4.  Trace left pleural effusion. 5. Stable renal cysts including large left renal cyst measuring 12 cm. No follow-up imaging recommended. Aortic Atherosclerosis (ICD10-I70.0). Electronically Signed   By: Darliss Cheney M.D.   On: 06/21/2023 21:24   DG Chest Port 1 View Result Date: 06/21/2023 CLINICAL DATA:  Short of breath EXAM:  PORTABLE CHEST 1 VIEW COMPARISON:  01/12/2023 FINDINGS: Single frontal view of the chest demonstrates stable right chest wall port. Cardiac silhouette is unremarkable. Stable ectasia and atherosclerosis of the thoracic aorta. Stable emphysema and parenchymal lung scarring without airspace disease, effusion, or pneumothorax. IMPRESSION: 1. Stable emphysema.  No acute airspace disease. Electronically Signed   By: Sharlet Salina M.D.   On: 06/21/2023 18:27    Procedures Procedures    Medications Ordered in ED Medications  amoxicillin-clavulanate (AUGMENTIN) 875-125 MG per tablet 1 tablet (has no administration in time range)  ondansetron (ZOFRAN) injection 4 mg (4 mg Intravenous Given 06/21/23 1833)  furosemide (LASIX) injection 20 mg (20 mg Intravenous Given 06/21/23 2134)  iohexol (OMNIPAQUE) 300 MG/ML solution 100 mL (100 mLs Intravenous Contrast Given 06/21/23 2052)    ED Course/ Medical Decision Making/ A&P Clinical Course as of 06/21/23 2300  Wed Jun 21, 2023  2040 Mildly elevated BNP compared to baseline, lungs do sound wet and will be given dose of IV lasix. CTAP pending. Initial troponin mildly elevated, will need repeat. [VK]  2146 CTAP with possible colitis otherwise no acute findings. Repeat troponin flat. [VK]  2249 Patient still having diarrhea otherwise no vomiting since being here, breathing improved. She will be discharged on antibiotics and recommended close primary care follow up. [VK]    Clinical Course User Index [VK] Rexford Maus, DO                                 Medical Decision Making This patient presents to the ED with chief  complaint(s) of N/V, SOB with pertinent past medical history of diverticulitis with colostomy in place, CAD, CHF, COPD on 2 L home O2 which further complicates the presenting complaint. The complaint involves an extensive differential diagnosis and also carries with it a high risk of complications and morbidity.    The differential diagnosis includes ACS, arrhythmia, anemia, pneumonia, pneumothorax, pulmonary edema, pleural effusion, dehydration, electrolyte abnormality, viral syndrome, gastritis, gastroenteritis, GERD, diverticulitis or other intra-abdominal infection  Additional history obtained: Additional history obtained from EMS  Records reviewed outpatient cardiology records  ED Course and Reassessment: On patient's arrival she is tachypneic though satting well on her home O2, otherwise in no acute distress.  She does appear dry however does have crackles in the lungs.  The patient will have EKG, labs including troponin and BNP as well as chest x-ray and CT of her abdomen and pelvis.  She will be given nausea control and will be closely reassessed.  Independent labs interpretation:  The following labs were independently interpreted: mild leukocytosis, mildly elevated BNP compared to baseline  Independent visualization of imaging: - I independently visualized the following imaging with scope of interpretation limited to determining acute life threatening conditions related to emergency care: CXR, CTAP, which revealed no acute disease in the chest, possible developing colitis near ostomy site  Consultation: - Consulted or discussed management/test interpretation w/ external professional: N/A  Consideration for admission or further workup: Patient has no emergent conditions requiring admission or further work-up at this time and is stable for discharge home with primary care follow-up  Social Determinants of health: N/A    Amount and/or Complexity of Data Reviewed Labs:  ordered. Radiology: ordered.  Risk Prescription drug management.          Final Clinical Impression(s) / ED Diagnoses Final diagnoses:  Colitis    Rx / DC Orders ED  Discharge Orders          Ordered    amoxicillin-clavulanate (AUGMENTIN) 875-125 MG tablet  Every 12 hours        06/21/23 2250    ondansetron (ZOFRAN) 4 MG tablet  Every 6 hours        06/21/23 2259              Rexford Maus, DO 06/21/23 2300

## 2023-06-22 DIAGNOSIS — K529 Noninfective gastroenteritis and colitis, unspecified: Secondary | ICD-10-CM | POA: Diagnosis not present

## 2023-06-22 NOTE — ED Notes (Signed)
 [facility] called for transport.

## 2023-10-09 ENCOUNTER — Ambulatory Visit: Payer: Medicare Other | Admitting: Cardiovascular Disease

## 2023-10-09 ENCOUNTER — Encounter: Payer: Self-pay | Admitting: Cardiology

## 2023-10-09 ENCOUNTER — Ambulatory Visit: Payer: Medicare Other | Attending: Physician Assistant | Admitting: Cardiology

## 2023-10-09 VITALS — BP 142/66 | HR 72 | Ht 62.0 in

## 2023-10-09 DIAGNOSIS — J9611 Chronic respiratory failure with hypoxia: Secondary | ICD-10-CM | POA: Diagnosis present

## 2023-10-09 DIAGNOSIS — Z9981 Dependence on supplemental oxygen: Secondary | ICD-10-CM | POA: Insufficient documentation

## 2023-10-09 DIAGNOSIS — I2510 Atherosclerotic heart disease of native coronary artery without angina pectoris: Secondary | ICD-10-CM | POA: Diagnosis not present

## 2023-10-09 DIAGNOSIS — I10 Essential (primary) hypertension: Secondary | ICD-10-CM | POA: Diagnosis not present

## 2023-10-09 DIAGNOSIS — E611 Iron deficiency: Secondary | ICD-10-CM | POA: Insufficient documentation

## 2023-10-09 DIAGNOSIS — I059 Rheumatic mitral valve disease, unspecified: Secondary | ICD-10-CM | POA: Insufficient documentation

## 2023-10-09 NOTE — Patient Instructions (Signed)
 Medication Instructions:  Your physician recommends that you continue on your current medications as directed. Please refer to the Current Medication list given to you today.  *If you need a refill on your cardiac medications before your next appointment, please call your pharmacy*  Lab Work: CBC and BMET today If you have labs (blood work) drawn today and your tests are completely normal, you will receive your results only by: MyChart Message (if you have MyChart) OR A paper copy in the mail If you have any lab test that is abnormal or we need to change your treatment, we will call you to review the results.  Testing/Procedures: Echo in September 2025 Your physician has requested that you have an echocardiogram. Echocardiography is a painless test that uses sound waves to create images of your heart. It provides your doctor with information about the size and shape of your heart and how well your heart's chambers and valves are working. This procedure takes approximately one hour. There are no restrictions for this procedure. Please do NOT wear cologne, perfume, aftershave, or lotions (deodorant is allowed). Please arrive 15 minutes prior to your appointment time.  Please note: We ask at that you not bring children with you during ultrasound (echo/ vascular) testing. Due to room size and safety concerns, children are not allowed in the ultrasound rooms during exams. Our front office staff cannot provide observation of children in our lobby area while testing is being conducted. An adult accompanying a patient to their appointment will only be allowed in the ultrasound room at the discretion of the ultrasound technician under special circumstances. We apologize for any inconvenience.   Follow-Up: At [facility], you and your health needs are our priority.  As part of our continuing mission to provide you with exceptional heart care, our providers are all part of one team.  This team  includes your primary Cardiologist (physician) and Advanced Practice Providers or APPs (Physician Assistants and Nurse Practitioners) who all work together to provide you with the care you need, when you need it.  Your next appointment:   6 months with Dr Stann Earnest  We recommend signing up for the patient portal called MyChart.  Sign up information is provided on this After Visit Summary.  MyChart is used to connect with patients for Virtual Visits (Telemedicine).  Patients are able to view lab/test results, encounter notes, upcoming appointments, etc.  Non-urgent messages can be sent to your provider as well.   To learn more about what you can do with MyChart, go to [email or web address].   Other Instructions Please schedule an appointment with Oncology as soon as possible for follow up

## 2023-10-09 NOTE — Progress Notes (Signed)
 Cardiology Office Note:  .   Date:  10/09/2023  ID:  Makayla Fisher, DOB 09-29-1922, MRN [id number] PCP: Ginnie Laine, NP  [facility] Providers Cardiologist:  Janelle Mediate, MD {  History of Present Illness: .   Makayla Fisher is a 89 y.o. female with history of CAD status post CABG 2009 in [place] , chronic HFpEF, follicular large cell non-Hodgkin's lymphoma in remission, hypertension, mild to moderate MR, sinus bradycardia, GERD, TIA 2021, IDA felt secondary to malabsorption followed by oncology, colostomy revision for stricture, emphysema, chronic thrombocytopenia, CKD, hypothyroidism.     CAD Remote history of CABG in [place]  2009. Reportedly has memory issues on statin.  Mitral regurgitation HFpEF Admitted for CHF exacerbation 12/2022.  Echo with preserved biventricular function with moderate MR. Not deemed candidate for valvular intervention.   NSTEMI April 2022 in the setting of hypertensive urgency, flash pulmonary edema. Already was on Plavix  for stroke, recommended aspirin  Plavix  for 1 year.  Social history Resides in [facility] assisted living facility [place].  Follicular large cell carcinoma Followed by oncology.  Underwent treatment with Rituxan  for at least 5 years.  Suspected to be cured by oncology.      Patient with history of moderate MR on 12/2022 echocardiogram during inpatient evaluation for HFpEF exacerbation.  Last seen January 2025 and doing fine on 20 mg of Lasix  daily.  Patient seen in the emergency room 05/2023 for nausea vomiting and shortness of breath.  No significant workup, treated for nausea/antibiotics.  Given 1 dose of IV Lasix  for crackles.  Today patient is here for 76-month follow-up.  She is accompanied here today with her niece who is taking care of her.  Patient is very hard of hearing.  However, she reports that she has good and bad days.  Sometimes feels more fatigued another time.  She is wheelchair-bound.   Occasionally reports some shortness of breath however feels this is related to her anxiety.  Otherwise she reports no other significant cardiac symptoms.  Reports having some phlegm occasionally.  We discussed trying to do wheelchair exercises and movement to the best of her ability.   ROS: Denies: Chest pain, shortness of breath, orthopnea, peripheral edema, palpitations, decreased exercise intolerance, lightheadedness.   Studies Reviewed: .    Risk Assessment/Calculations:         Physical Exam:   VS:  BP (!) 142/66   Pulse 72   Ht 5' 2 (1.575 m)   BMI 26.78 kg/m    Wt Readings from Last 3 Encounters:  04/27/23 146 lb 6.4 oz (66.4 kg)  02/27/23 144 lb (65.3 kg)  01/16/23 144 lb 6.4 oz (65.5 kg)    Unable to stand on scale today.   GEN: Well nourished, well developed in no acute distress NECK: No JVD; No carotid bruits CARDIAC: RRR, 3/6 murmur RESPIRATORY:  Mild crackles from ILD ABDOMEN: Soft, non-tender, non-distended EXTREMITIES:  No edema; No deformity   ASSESSMENT AND PLAN: .    Chronic HFpEF Moderate MR Most recent echocardiogram September 2024 with preserved biventricular function.  Moderate MR. She looks euvolemic and still doing well on the Lasix . Continue Lasix  20 mg daily Not a candidate for valvular intervention. Repeat echo 12/2023  CAD status post remote CABG 2009 Seems to have stable disease.  Does not admit to any significant anginal complaints. Continue with aspirin , atorvastatin  40 mg. Advanced age makes aggressive therapy likely not very beneficial with more side effects.   Hypertension Reasonably controlled today in the 140s.  Not being aggressive given risk for hypotension and fall risk/frailty. Continue with amlodipine  7.5 mg daily  COPD Chronically on 2 L nasal cannula.  IDA She is followed by oncology.  Last seen November 2024.  Was supposed to follow-up in 1 month but has not followed up.  I have recommended that they follow-up as soon as  possible.  Hemoglobin has been stable but very low in the sevens and eights previously. Obtaining CBC today and BMP    Dispo: Follow-up in 6 months with Dr. Stann Earnest.  Signed, Burnetta Cart, PA-C

## 2023-10-10 LAB — BASIC METABOLIC PANEL WITH GFR
BUN/Creatinine Ratio: 20 (ref 12–28)
BUN: 28 mg/dL (ref 10–36)
CO2: 18 mmol/L — ABNORMAL LOW (ref 20–29)
Calcium: 10 mg/dL (ref 8.7–10.3)
Chloride: 103 mmol/L (ref 96–106)
Creatinine, Ser: 1.43 mg/dL — ABNORMAL HIGH (ref 0.57–1.00)
Glucose: 73 mg/dL (ref 70–99)
Potassium: 4.8 mmol/L (ref 3.5–5.2)
Sodium: 144 mmol/L (ref 134–144)
eGFR: 33 mL/min/{1.73_m2} — ABNORMAL LOW (ref >59)

## 2023-10-10 LAB — CBC
Hematocrit: 32.2 % — ABNORMAL LOW (ref 34.0–46.6)
Hemoglobin: 9.4 g/dL — ABNORMAL LOW (ref 11.1–15.9)
MCH: 28.1 pg (ref 26.6–33.0)
MCHC: 29.2 g/dL — ABNORMAL LOW (ref 31.5–35.7)
MCV: 96 fL (ref 79–97)
Platelets: 171 10*3/uL (ref 150–450)
RBC: 3.34 x10E6/uL — ABNORMAL LOW (ref 3.77–5.28)
RDW: 16.5 % — ABNORMAL HIGH (ref 11.7–15.4)
WBC: 9.1 10*3/uL (ref 3.4–10.8)

## 2023-10-11 ENCOUNTER — Ambulatory Visit: Payer: Self-pay | Admitting: Cardiology

## 2023-10-11 DIAGNOSIS — Z79899 Other long term (current) drug therapy: Secondary | ICD-10-CM

## 2023-10-24 ENCOUNTER — Inpatient Hospital Stay: Attending: Registered Nurse

## 2023-10-24 ENCOUNTER — Inpatient Hospital Stay

## 2023-10-24 ENCOUNTER — Inpatient Hospital Stay: Admitting: Medical Oncology

## 2023-11-21 ENCOUNTER — Other Ambulatory Visit (HOSPITAL_COMMUNITY)

## 2023-11-30 ENCOUNTER — Inpatient Hospital Stay: Attending: Registered Nurse

## 2023-11-30 ENCOUNTER — Other Ambulatory Visit: Payer: Self-pay

## 2023-11-30 ENCOUNTER — Inpatient Hospital Stay (HOSPITAL_BASED_OUTPATIENT_CLINIC_OR_DEPARTMENT_OTHER): Admitting: Hematology & Oncology

## 2023-11-30 ENCOUNTER — Inpatient Hospital Stay

## 2023-11-30 VITALS — BP 124/80 | HR 69 | Resp 17

## 2023-11-30 DIAGNOSIS — G629 Polyneuropathy, unspecified: Secondary | ICD-10-CM | POA: Insufficient documentation

## 2023-11-30 DIAGNOSIS — C8203 Follicular lymphoma grade I, intra-abdominal lymph nodes: Secondary | ICD-10-CM

## 2023-11-30 DIAGNOSIS — D509 Iron deficiency anemia, unspecified: Secondary | ICD-10-CM | POA: Diagnosis not present

## 2023-11-30 DIAGNOSIS — D500 Iron deficiency anemia secondary to blood loss (chronic): Secondary | ICD-10-CM | POA: Diagnosis not present

## 2023-11-30 DIAGNOSIS — Z7982 Long term (current) use of aspirin: Secondary | ICD-10-CM | POA: Insufficient documentation

## 2023-11-30 DIAGNOSIS — C859A Non-Hodgkin lymphoma, unspecified, in remission: Secondary | ICD-10-CM | POA: Insufficient documentation

## 2023-11-30 DIAGNOSIS — Z882 Allergy status to sulfonamides status: Secondary | ICD-10-CM | POA: Insufficient documentation

## 2023-11-30 DIAGNOSIS — Z888 Allergy status to other drugs, medicaments and biological substances status: Secondary | ICD-10-CM | POA: Diagnosis not present

## 2023-11-30 DIAGNOSIS — Z881 Allergy status to other antibiotic agents status: Secondary | ICD-10-CM | POA: Diagnosis not present

## 2023-11-30 DIAGNOSIS — Z79899 Other long term (current) drug therapy: Secondary | ICD-10-CM | POA: Diagnosis not present

## 2023-11-30 DIAGNOSIS — D649 Anemia, unspecified: Secondary | ICD-10-CM

## 2023-11-30 DIAGNOSIS — K909 Intestinal malabsorption, unspecified: Secondary | ICD-10-CM | POA: Insufficient documentation

## 2023-11-30 DIAGNOSIS — Z79890 Hormone replacement therapy (postmenopausal): Secondary | ICD-10-CM | POA: Insufficient documentation

## 2023-11-30 LAB — CBC
HCT: 27.5 % — ABNORMAL LOW (ref 36.0–46.0)
Hemoglobin: 8.5 g/dL — ABNORMAL LOW (ref 12.0–15.0)
MCH: 28.1 pg (ref 26.0–34.0)
MCHC: 30.9 g/dL (ref 30.0–36.0)
MCV: 91.1 fL (ref 80.0–100.0)
Platelets: 163 K/uL (ref 150–400)
RBC: 3.02 MIL/uL — ABNORMAL LOW (ref 3.87–5.11)
RDW: 17.6 % — ABNORMAL HIGH (ref 11.5–15.5)
WBC: 7 K/uL (ref 4.0–10.5)
nRBC: 0 % (ref 0.0–0.2)

## 2023-11-30 LAB — IRON AND IRON BINDING CAPACITY (CC-WL,HP ONLY)
Iron: 28 ug/dL (ref 28–170)
Saturation Ratios: 8 % — ABNORMAL LOW (ref 10.4–31.8)
TIBC: 358 ug/dL (ref 250–450)
UIBC: 330 ug/dL

## 2023-11-30 LAB — FERRITIN: Ferritin: 32 ng/mL (ref 11–307)

## 2023-11-30 MED ORDER — SODIUM CHLORIDE 0.9 % IV SOLN
1000.0000 mg | Freq: Once | INTRAVENOUS | Status: AC
  Administered 2023-11-30: 1000 mg via INTRAVENOUS
  Filled 2023-11-30: qty 10

## 2023-11-30 MED ORDER — SODIUM CHLORIDE 0.9 % IV SOLN: INTRAVENOUS | Status: DC

## 2023-11-30 NOTE — Progress Notes (Signed)
 Hematology and Oncology Follow Up Visit  Makayla Fisher [id number] 1922/08/07 89 y.o. 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  Makayla Fisher is back for followup.  We last saw her I think back in November.  Since then, she has been about the same.  She is at the assisted living facility.  She is having her chronic problems.  She is having some neuropathy issues.  Medically, this appears to be in the left foot.  Has had that she may have something with the ankle.  Sound like there may be some nerve entrapment in the ankle.  She seems to be eating okay.  She is having no problems with nausea or vomiting.  She is having no problems with her ostomy.  She has a chronic ostomy that seems to be doing okay.  She has had no cough.  She does does feel tired.  She has had no problems swallowing.  Her iron  studies back in November showed a ferritin 132 with an iron  saturation of 16%.  I noted that her hemoglobin is quite low.  Is 8.5.  Had to believe that she is iron  deficient, once again.  Since it is hard to get her here, we will see about giving her some iron  while she is here today.  Overall, I would have said that her performance status is probably ECOG 3.   Wt Readings from Last 3 Encounters:  04/27/23 146 lb 6.4 oz (66.4 kg)  02/27/23 144 lb (65.3 kg)  01/16/23 144 lb 6.4 oz (65.5 kg)    Medications:  Current Outpatient Medications:    amLODipine  (NORVASC ) 5 MG tablet, Take 1.5 tablets (7.5 mg total) by mouth daily., Disp: 45 tablet, Rfl: 2   ARTIFICIAL TEAR OP, Place 1 drop into both eyes as needed (dry eyes)., Disp: , Rfl:    aspirin  81 MG EC tablet, Take 1 tablet (81 mg total) by mouth daily. Swallow whole., Disp: 30  tablet, Rfl: 11   atorvastatin  (LIPITOR) 40 MG tablet, Take 1 tablet (40 mg total) by mouth at bedtime., Disp: 30 tablet, Rfl: 30   busPIRone (BUSPAR) 7.5 MG tablet, Take 7.5 mg by mouth 2 (two) times daily., Disp: , Rfl:    Calcium  Carb-Cholecalciferol  (CALCIUM  600+D3 PO), Take 1 tablet by mouth at bedtime., Disp: , Rfl:    Cholecalciferol  (VITAMIN D3 SUPER STRENGTH) 50 MCG (2000 UT) CAPS, Take 4,000 Units by mouth daily. , Disp: , Rfl:    Cranberry 425 MG CAPS, Take 425 mg by mouth daily. , Disp: , Rfl:    docusate sodium  (COLACE) 100 MG capsule, Take 100 mg by mouth daily as needed for mild constipation., Disp: , Rfl:    furosemide  (LASIX ) 20 MG tablet, Take 1 tablet (20 mg total) by mouth daily., Disp: 30 tablet, Rfl: 3   levothyroxine  (SYNTHROID ) 25 MCG tablet, Take 25 mcg by mouth daily before breakfast. (Patient not taking: Reported on 10/09/2023), Disp: , Rfl:    levothyroxine  (SYNTHROID ) 50 MCG tablet, Take 50 mcg by mouth daily., Disp: , Rfl:    lidocaine -prilocaine  (EMLA ) cream, Apply 1 application topically See admin instructions. Apply to port-a-cath one hour prior to procedure, Disp: , Rfl:    loratadine (CLARITIN) 10 MG tablet, Take 10 mg  by mouth daily., Disp: , Rfl:    Multiple Vitamin (DAILY VITE PO), Take 1 tablet by mouth daily., Disp: , Rfl:    Multiple Vitamins-Minerals (PRESERVISION AREDS PO), Take 1 tablet by mouth in the morning and at bedtime., Disp: , Rfl:    ondansetron  (ZOFRAN ) 4 MG tablet, Take 1 tablet (4 mg total) by mouth every 6 (six) hours., Disp: 12 tablet, Rfl: 0   OXYGEN , Inhale 2 L into the lungs continuous., Disp: , Rfl:    psyllium (METAMUCIL) 58.6 % packet, Take 1 packet by mouth every Monday, Wednesday, and Friday. ORANGE, Disp: , Rfl:    REFRESH RELIEVA PF 0.5-1 % SOLN, Place 1-2 drops into both eyes as needed., Disp: , Rfl:    rOPINIRole  (REQUIP ) 1 MG tablet, Take 1 mg by mouth at bedtime., Disp: , Rfl:    sertraline  (ZOLOFT ) 25 MG tablet, Take 25 mg by  mouth daily. (Patient taking differently: Take 75 mg by mouth daily.), Disp: , Rfl:    timolol  (TIMOPTIC ) 0.5 % ophthalmic solution, Place 1 drop into both eyes 2 (two) times daily., Disp: , Rfl:    trimethoprim  (TRIMPEX ) 100 MG tablet, Take 100 mg by mouth daily., Disp: , Rfl:  No current facility-administered medications for this visit.  Facility-Administered Medications Ordered in Other Visits:    0.9 %  sodium chloride  infusion, , Intravenous, Continuous, Leonidas Boateng, Maude SAUNDERS, MD, Last Rate: 10 mL/hr at 11/30/23 1554, New Bag at 11/30/23 1554   ferric derisomaltose  (MONOFERRIC ) 1,000 mg in sodium chloride  0.9 % 100 mL infusion, 1,000 mg, Intravenous, Once, Timmy Maude SAUNDERS, MD, Last Rate: 300 mL/hr at 11/30/23 1555, 1,000 mg at 11/30/23 1555  Allergies:  Allergies  Allergen Reactions   Epipen  [Epinephrine  Hcl (Nasal)] Palpitations   Levaquin [Levofloxacin In D5w] Other (See Comments)    Weakness- Allergic, per MAR   Beta Adrenergic Blockers Other (See Comments)    intolerant to higher doses of BB due to bradycardia.   Chlorhexidine  Gluconate Other (See Comments)    Pt declined use and suspects she may have an intolerance   Heparin  Other (See Comments)    Allergic, pre MAR- Maybe affected platelets, per patient Pt has tolerated heparin  flush many times since 2015   Sulfa Antibiotics Rash    Past Medical History, Surgical history, Social history, and Family History were reviewed and updated.  Review of Systems: Review of Systems  Constitutional: Negative.   HENT: Negative.    Eyes: Negative.   Respiratory:  Negative for shortness of breath.   Cardiovascular:  Negative for chest pain and palpitations.  Gastrointestinal:  Negative for diarrhea and nausea.  Genitourinary: Negative.   Musculoskeletal:  Positive for joint pain and myalgias.  Skin: Negative.   Neurological:  Positive for tingling.  Endo/Heme/Allergies: Negative.   Psychiatric/Behavioral: Negative.        Physical Exam:  Vital signs show temperature of 98.  Pulse 72.  Blood pressure 142/66.  Weight was not taken.  Physical Exam Vitals reviewed.  Constitutional:      Comments: Resting comfortably in a wheelchair. Has on portable oxygen  via nasal canula.   HENT:     Head: Normocephalic and atraumatic.  Eyes:     Pupils: Pupils are equal, round, and reactive to light.  Cardiovascular:     Rate and Rhythm: Normal rate and regular rhythm.     Heart sounds: Normal heart sounds.  Pulmonary:     Effort: Pulmonary effort is normal.     Breath sounds:  Normal breath sounds.  Abdominal:     General: Bowel sounds are normal.     Palpations: Abdomen is soft.  Musculoskeletal:        General: No tenderness or deformity. Normal range of motion.     Cervical back: Normal range of motion.  Lymphadenopathy:     Cervical: No cervical adenopathy.  Skin:    General: Skin is warm and dry.     Findings: No erythema or rash.  Neurological:     Mental Status: She is alert and oriented to person, place, and time.  Psychiatric:        Behavior: Behavior normal.        Thought Content: Thought content normal.        Judgment: Judgment normal.      Lab Results  Component Value Date   WBC 7.0 11/30/2023   HGB 8.5 (L) 11/30/2023   HCT 27.5 (L) 11/30/2023   MCV 91.1 11/30/2023   PLT 163 11/30/2023     Chemistry      Component Value Date/Time   NA 144 10/09/2023 1628   NA 143 01/31/2017 0931   NA 141 05/08/2014 1041   K 4.8 10/09/2023 1628   K 3.7 01/31/2017 0931   K 4.5 05/08/2014 1041   CL 103 10/09/2023 1628   CL 106 01/31/2017 0931   CO2 18 (L) 10/09/2023 1628   CO2 27 01/31/2017 0931   CO2 24 05/08/2014 1041   BUN 28 10/09/2023 1628   BUN 10 01/31/2017 0931   BUN 14.2 05/08/2014 1041   CREATININE 1.43 (H) 10/09/2023 1628   CREATININE 0.93 03/09/2023 1255   CREATININE 0.9 01/31/2017 0931   CREATININE 0.8 05/08/2014 1041      Component Value Date/Time   CALCIUM  10.0  10/09/2023 1628   CALCIUM  9.7 01/31/2017 0931   CALCIUM  9.2 05/08/2014 1041   ALKPHOS 64 06/21/2023 1834   ALKPHOS 57 01/31/2017 0931   ALKPHOS 66 05/08/2014 1041   AST 26 06/21/2023 1834   AST 22 03/09/2023 1255   AST 23 05/08/2014 1041   ALT 13 06/21/2023 1834   ALT 12 03/09/2023 1255   ALT 14 01/31/2017 0931   ALT 10 05/08/2014 1041   BILITOT 0.6 06/21/2023 1834   BILITOT 0.4 03/09/2023 1255   BILITOT 0.42 05/08/2014 1041     8 Impression and Plan: Ms. Cena is 89 year old female. She has a follicular large cell lymphoma.  It has been over 5 years since she had completion of her treatment with respect to maintenance Rituxan . She is suspected to be cured by Dr. Maury.   Again, she is quite anemic.  I know she does not do all that much but yet it would be nice to try to get her hemoglobin up a little bit more.  Will see about giving her some IV iron .  Unfortunately, her plan had to get her back a little bit sooner than 6 months.  I plan to get her back in about 6 weeks.  I would like to try to be proactive and try to prevent her from being hospitalized.   Maude JONELLE Crease, MD 8/7/20253:56 PM

## 2023-11-30 NOTE — Patient Instructions (Signed)

## 2023-11-30 NOTE — Patient Instructions (Signed)

## 2024-01-11 ENCOUNTER — Inpatient Hospital Stay: Attending: Registered Nurse

## 2024-01-11 ENCOUNTER — Inpatient Hospital Stay

## 2024-01-11 ENCOUNTER — Inpatient Hospital Stay (HOSPITAL_BASED_OUTPATIENT_CLINIC_OR_DEPARTMENT_OTHER): Admitting: Hematology & Oncology

## 2024-01-11 VITALS — BP 186/56 | HR 62 | Temp 97.8°F | Resp 19

## 2024-01-11 DIAGNOSIS — Z9981 Dependence on supplemental oxygen: Secondary | ICD-10-CM | POA: Diagnosis not present

## 2024-01-11 DIAGNOSIS — D500 Iron deficiency anemia secondary to blood loss (chronic): Secondary | ICD-10-CM | POA: Diagnosis not present

## 2024-01-11 DIAGNOSIS — K909 Intestinal malabsorption, unspecified: Secondary | ICD-10-CM | POA: Insufficient documentation

## 2024-01-11 DIAGNOSIS — Z7982 Long term (current) use of aspirin: Secondary | ICD-10-CM | POA: Insufficient documentation

## 2024-01-11 DIAGNOSIS — Z79890 Hormone replacement therapy (postmenopausal): Secondary | ICD-10-CM | POA: Insufficient documentation

## 2024-01-11 DIAGNOSIS — C859A Non-Hodgkin lymphoma, unspecified, in remission: Secondary | ICD-10-CM | POA: Insufficient documentation

## 2024-01-11 DIAGNOSIS — C8203 Follicular lymphoma grade I, intra-abdominal lymph nodes: Secondary | ICD-10-CM | POA: Diagnosis not present

## 2024-01-11 DIAGNOSIS — F0390 Unspecified dementia without behavioral disturbance: Secondary | ICD-10-CM | POA: Diagnosis not present

## 2024-01-11 DIAGNOSIS — Z79899 Other long term (current) drug therapy: Secondary | ICD-10-CM | POA: Insufficient documentation

## 2024-01-11 DIAGNOSIS — D509 Iron deficiency anemia, unspecified: Secondary | ICD-10-CM | POA: Insufficient documentation

## 2024-01-11 LAB — CMP (CANCER CENTER ONLY)
ALT: 25 U/L (ref 0–44)
AST: 34 U/L (ref 15–41)
Albumin: 4.1 g/dL (ref 3.5–5.0)
Alkaline Phosphatase: 67 U/L (ref 38–126)
Anion gap: 11 (ref 5–15)
BUN: 20 mg/dL (ref 8–23)
CO2: 28 mmol/L (ref 22–32)
Calcium: 10.6 mg/dL — ABNORMAL HIGH (ref 8.9–10.3)
Chloride: 106 mmol/L (ref 98–111)
Creatinine: 0.98 mg/dL (ref 0.44–1.00)
GFR, Estimated: 52 mL/min — ABNORMAL LOW (ref >60)
Glucose, Bld: 94 mg/dL (ref 70–99)
Potassium: 4.1 mmol/L (ref 3.5–5.1)
Sodium: 144 mmol/L (ref 135–145)
Total Bilirubin: 0.3 mg/dL (ref 0.0–1.2)
Total Protein: 5.9 g/dL — ABNORMAL LOW (ref 6.5–8.1)

## 2024-01-11 LAB — CBC WITH DIFFERENTIAL (CANCER CENTER ONLY)
Abs Immature Granulocytes: 0.03 K/uL (ref 0.00–0.07)
Basophils Absolute: 0.1 K/uL (ref 0.0–0.1)
Basophils Relative: 1 %
Eosinophils Absolute: 0.3 K/uL (ref 0.0–0.5)
Eosinophils Relative: 4 %
HCT: 33.8 % — ABNORMAL LOW (ref 36.0–46.0)
Hemoglobin: 10.7 g/dL — ABNORMAL LOW (ref 12.0–15.0)
Immature Granulocytes: 0 %
Lymphocytes Relative: 35 %
Lymphs Abs: 2.6 K/uL (ref 0.7–4.0)
MCH: 29.6 pg (ref 26.0–34.0)
MCHC: 31.7 g/dL (ref 30.0–36.0)
MCV: 93.6 fL (ref 80.0–100.0)
Monocytes Absolute: 0.7 K/uL (ref 0.1–1.0)
Monocytes Relative: 10 %
Neutro Abs: 3.7 K/uL (ref 1.7–7.7)
Neutrophils Relative %: 50 %
Platelet Count: 126 K/uL — ABNORMAL LOW (ref 150–400)
RBC: 3.61 MIL/uL — ABNORMAL LOW (ref 3.87–5.11)
RDW: 19.4 % — ABNORMAL HIGH (ref 11.5–15.5)
WBC Count: 7.3 K/uL (ref 4.0–10.5)
nRBC: 0 % (ref 0.0–0.2)

## 2024-01-11 LAB — IRON AND IRON BINDING CAPACITY (CC-WL,HP ONLY)
Iron: 81 ug/dL (ref 28–170)
Saturation Ratios: 33 % — ABNORMAL HIGH (ref 10.4–31.8)
TIBC: 246 ug/dL — ABNORMAL LOW (ref 250–450)
UIBC: 165 ug/dL

## 2024-01-11 LAB — FERRITIN: Ferritin: 178 ng/mL (ref 11–307)

## 2024-01-11 LAB — VITAMIN B12: Vitamin B-12: 394 pg/mL (ref 180–914)

## 2024-01-11 NOTE — Patient Instructions (Signed)

## 2024-01-11 NOTE — Progress Notes (Signed)
 Hematology and Oncology Follow Up Visit  Makayla Fisher [id number] 1922-12-28 89 y.o. 01/11/2024   Principle Diagnosis:  Follicular large cell non-Hodgkin's lymphoma Iron  deficiency anemia secondary to malabsorption  Previous Therapy:   Status post cycle #8 of maintenance Rituxan  - complete in June 2018  Current Therapy:  IV iron -Monoferric  as indicated-as given 11/30/2023      Interim History:  [name] is back for followup.  We saw her 6 weeks ago.  We had to give her iron .  She is quite anemic.  Her hemoglobin was only 8.5.  Her iron  saturation was I think 80%.  We gave her Monoferric .  She did well with the Monoferric .  I would say that she seems to be doing a little bit better.  She still has a little bit of dementia.  She is wearing oxygen .  This does seem to help her.  She has had no obvious bleeding.  There is been no melena or bright red blood per rectum.  She has had no problems with nausea or vomiting.  She is eating okay.  She has had no fever.  She has had no cough..  She sat for a echocardiogram and a couple weeks.  Overall, I would say that her performance status is probably ECOG 3.   Wt Readings from Last 3 Encounters:  04/27/23 146 lb 6.4 oz (66.4 kg)  02/27/23 144 lb (65.3 kg)  01/16/23 144 lb 6.4 oz (65.5 kg)    Medications:  Current Outpatient Medications:    amLODipine  (NORVASC ) 5 MG tablet, Take 1.5 tablets (7.5 mg total) by mouth daily. (Patient taking differently: Take 5 mg by mouth daily.), Disp: 45 tablet, Rfl: 2   ARTIFICIAL TEAR OP, Place 1 drop into both eyes as needed (dry eyes)., Disp: , Rfl:    aspirin  81 MG EC tablet, Take 1 tablet (81 mg total) by mouth daily. Swallow whole., Disp: 30 tablet, Rfl: 11   atorvastatin  (LIPITOR) 40 MG tablet, Take 1 tablet (40 mg total) by mouth at bedtime., Disp: 30 tablet, Rfl: 30   busPIRone (BUSPAR) 7.5 MG tablet, Take 7.5 mg by mouth 2 (two) times daily., Disp: , Rfl:    Calcium  Carb-Cholecalciferol   (CALCIUM  600+D3 PO), Take 1 tablet by mouth at bedtime., Disp: , Rfl:    Cholecalciferol  (VITAMIN D3 SUPER STRENGTH) 50 MCG (2000 UT) CAPS, Take 4,000 Units by mouth daily. , Disp: , Rfl:    Cranberry 425 MG CAPS, Take 425 mg by mouth daily. , Disp: , Rfl:    furosemide  (LASIX ) 20 MG tablet, Take 1 tablet (20 mg total) by mouth daily. (Patient taking differently: Take 20 mg by mouth every other day.), Disp: 30 tablet, Rfl: 3   INCRUSE ELLIPTA  62.5 MCG/ACT AEPB, Inhale 1 puff into the lungs daily., Disp: , Rfl:    levothyroxine  (SYNTHROID ) 125 MCG tablet, Take 62.5 mcg by mouth daily before breakfast., Disp: , Rfl:    lidocaine -prilocaine  (EMLA ) cream, Apply 1 application topically See admin instructions. Apply to port-a-cath one hour prior to procedure, Disp: , Rfl:    loratadine (CLARITIN) 10 MG tablet, Take 10 mg by mouth daily., Disp: , Rfl:    Melatonin 3 MG SUBL, Take 1 tablet by mouth at bedtime., Disp: , Rfl:    Multiple Vitamin (DAILY VITE PO), Take 1 tablet by mouth daily., Disp: , Rfl:    Multiple Vitamins-Minerals (PRESERVISION AREDS PO), Take 1 tablet by mouth in the morning and at bedtime., Disp: , Rfl:  OXYGEN , Inhale 2 L into the lungs continuous., Disp: , Rfl:    psyllium (METAMUCIL) 58.6 % packet, Take 1 packet by mouth every Monday, Wednesday, and Friday. ORANGE, Disp: , Rfl:    REFRESH RELIEVA PF 0.5-1 % SOLN, Place 1-2 drops into both eyes as needed., Disp: , Rfl:    rOPINIRole  (REQUIP ) 1 MG tablet, Take 1 mg by mouth at bedtime., Disp: , Rfl:    sertraline  (ZOLOFT ) 25 MG tablet, Take 25 mg by mouth daily. (Patient taking differently: Take 75 mg by mouth daily.), Disp: , Rfl:    timolol  (TIMOPTIC ) 0.5 % ophthalmic solution, Place 1 drop into both eyes 2 (two) times daily., Disp: , Rfl:    trimethoprim  (TRIMPEX ) 100 MG tablet, Take 100 mg by mouth daily., Disp: , Rfl:    levothyroxine  (SYNTHROID ) 25 MCG tablet, Take 25 mcg by mouth daily before breakfast. (Patient not taking:  Reported on 01/11/2024), Disp: , Rfl:    levothyroxine  (SYNTHROID ) 50 MCG tablet, Take 50 mcg by mouth daily. (Patient not taking: Reported on 01/11/2024), Disp: , Rfl:   Allergies:  Allergies  Allergen Reactions   Epipen  [Epinephrine  Hcl (Nasal)] Palpitations   Levaquin [Levofloxacin In D5w] Other (See Comments)    Weakness- Allergic, per MAR   Beta Adrenergic Blockers Other (See Comments)    intolerant to higher doses of BB due to bradycardia.   Chlorhexidine  Gluconate Other (See Comments)    Pt declined use and suspects she may have an intolerance   Heparin  Other (See Comments)    Allergic, pre MAR- Maybe affected platelets, per patient Pt has tolerated heparin  flush many times since 2015   Sulfa Antibiotics Rash    Past Medical History, Surgical history, Social history, and Family History were reviewed and updated.  Review of Systems: Review of Systems  Constitutional: Negative.   HENT: Negative.    Eyes: Negative.   Respiratory:  Negative for shortness of breath.   Cardiovascular:  Negative for chest pain and palpitations.  Gastrointestinal:  Negative for diarrhea and nausea.  Genitourinary: Negative.   Musculoskeletal:  Positive for joint pain and myalgias.  Skin: Negative.   Neurological:  Positive for tingling.  Endo/Heme/Allergies: Negative.   Psychiatric/Behavioral: Negative.       Physical Exam:  Vital signs show temperature of 97.8.  Pulse 62.  Blood pressure 186/56.  Weight was not taken.  Physical Exam Vitals reviewed.  Constitutional:      Comments: Resting comfortably in a wheelchair. Has on portable oxygen  via nasal canula.   HENT:     Head: Normocephalic and atraumatic.  Eyes:     Pupils: Pupils are equal, round, and reactive to light.  Cardiovascular:     Rate and Rhythm: Normal rate and regular rhythm.     Heart sounds: Normal heart sounds.  Pulmonary:     Effort: Pulmonary effort is normal.     Breath sounds: Normal breath sounds.   Abdominal:     General: Bowel sounds are normal.     Palpations: Abdomen is soft.  Musculoskeletal:        General: No tenderness or deformity. Normal range of motion.     Cervical back: Normal range of motion.  Lymphadenopathy:     Cervical: No cervical adenopathy.  Skin:    General: Skin is warm and dry.     Findings: No erythema or rash.  Neurological:     Mental Status: She is alert and oriented to person, place, and time.  Psychiatric:  Behavior: Behavior normal.        Thought Content: Thought content normal.        Judgment: Judgment normal.      Lab Results  Component Value Date   WBC 7.3 01/11/2024   HGB 10.7 (L) 01/11/2024   HCT 33.8 (L) 01/11/2024   MCV 93.6 01/11/2024   PLT 126 (L) 01/11/2024     Chemistry      Component Value Date/Time   NA 144 10/09/2023 1628   NA 143 01/31/2017 0931   NA 141 05/08/2014 1041   K 4.8 10/09/2023 1628   K 3.7 01/31/2017 0931   K 4.5 05/08/2014 1041   CL 103 10/09/2023 1628   CL 106 01/31/2017 0931   CO2 18 (L) 10/09/2023 1628   CO2 27 01/31/2017 0931   CO2 24 05/08/2014 1041   BUN 28 10/09/2023 1628   BUN 10 01/31/2017 0931   BUN 14.2 05/08/2014 1041   CREATININE 1.43 (H) 10/09/2023 1628   CREATININE 0.93 03/09/2023 1255   CREATININE 0.9 01/31/2017 0931   CREATININE 0.8 05/08/2014 1041      Component Value Date/Time   CALCIUM  10.0 10/09/2023 1628   CALCIUM  9.7 01/31/2017 0931   CALCIUM  9.2 05/08/2014 1041   ALKPHOS 64 06/21/2023 1834   ALKPHOS 57 01/31/2017 0931   ALKPHOS 66 05/08/2014 1041   AST 26 06/21/2023 1834   AST 22 03/09/2023 1255   AST 23 05/08/2014 1041   ALT 13 06/21/2023 1834   ALT 12 03/09/2023 1255   ALT 14 01/31/2017 0931   ALT 10 05/08/2014 1041   BILITOT 0.6 06/21/2023 1834   BILITOT 0.4 03/09/2023 1255   BILITOT 0.42 05/08/2014 1041      Impression and Plan: Ms. Nicosia is 89 year old female. She has a follicular large cell lymphoma.  It has been over 5 years since she had  completion of her treatment with respect to maintenance Rituxan .   Her hemoglobin is getting better.  Hopefully will continue to improve.  We will see what her iron  studies look like.  I suppose that we can was give her another dose of IV iron .  We will try to get her back now after the Holiday season.    Maude JONELLE Crease, MD 9/18/20253:32 PM

## 2024-01-12 ENCOUNTER — Ambulatory Visit: Payer: Self-pay | Admitting: Hematology & Oncology

## 2024-01-17 ENCOUNTER — Telehealth (HOSPITAL_COMMUNITY): Payer: Self-pay | Admitting: Cardiology

## 2024-01-17 NOTE — Telephone Encounter (Signed)
 Pt POA cancelled echocardiogram due to patient  in Hospice care. Der will be removed from the echo WQ. Thank you.

## 2024-01-23 ENCOUNTER — Ambulatory Visit (HOSPITAL_COMMUNITY)

## 2024-01-24 ENCOUNTER — Encounter: Payer: Self-pay | Admitting: Hematology & Oncology

## 2024-02-24 DEATH — deceased

## 2024-03-06 ENCOUNTER — Encounter: Payer: Self-pay | Admitting: Hematology & Oncology

## 2024-03-13 ENCOUNTER — Inpatient Hospital Stay

## 2024-04-30 ENCOUNTER — Other Ambulatory Visit

## 2024-04-30 ENCOUNTER — Ambulatory Visit: Admitting: Family
